# Patient Record
Sex: Female | Born: 1955
Health system: Southern US, Community
[De-identification: ages and names within clinical notes are randomized; demographics above are authoritative.]

## PROBLEM LIST (undated history)

## (undated) DIAGNOSIS — F32A Depression, unspecified: Secondary | ICD-10-CM

## (undated) DIAGNOSIS — L0232 Furuncle of buttock: Secondary | ICD-10-CM

## (undated) DIAGNOSIS — I10 Essential (primary) hypertension: Secondary | ICD-10-CM

## (undated) DIAGNOSIS — F329 Major depressive disorder, single episode, unspecified: Secondary | ICD-10-CM

## (undated) DIAGNOSIS — E119 Type 2 diabetes mellitus without complications: Secondary | ICD-10-CM

## (undated) DIAGNOSIS — E1140 Type 2 diabetes mellitus with diabetic neuropathy, unspecified: Secondary | ICD-10-CM

## (undated) DIAGNOSIS — E785 Hyperlipidemia, unspecified: Secondary | ICD-10-CM

## (undated) DIAGNOSIS — E1110 Type 2 diabetes mellitus with ketoacidosis without coma: Secondary | ICD-10-CM

## (undated) DIAGNOSIS — G629 Polyneuropathy, unspecified: Secondary | ICD-10-CM

## (undated) DIAGNOSIS — L97509 Non-pressure chronic ulcer of other part of unspecified foot with unspecified severity: Secondary | ICD-10-CM

## (undated) DIAGNOSIS — D649 Anemia, unspecified: Secondary | ICD-10-CM

## (undated) DIAGNOSIS — I739 Peripheral vascular disease, unspecified: Secondary | ICD-10-CM

## (undated) HISTORY — DX: Peripheral vascular disease, unspecified: I73.9

## (undated) HISTORY — PX: COLONOSCOPY: SHX174

## (undated) HISTORY — DX: Hyperlipidemia, unspecified: E78.5

---

## 1998-12-23 ENCOUNTER — Emergency Department (HOSPITAL_COMMUNITY): Admission: EM | Admit: 1998-12-23 | Discharge: 1998-12-23 | Payer: Self-pay | Admitting: Emergency Medicine

## 2000-04-24 ENCOUNTER — Ambulatory Visit (HOSPITAL_COMMUNITY): Admission: RE | Admit: 2000-04-24 | Discharge: 2000-04-24 | Payer: Self-pay | Admitting: Orthopedic Surgery

## 2000-04-24 ENCOUNTER — Encounter: Payer: Self-pay | Admitting: Orthopedic Surgery

## 2000-04-25 ENCOUNTER — Emergency Department (HOSPITAL_COMMUNITY): Admission: EM | Admit: 2000-04-25 | Discharge: 2000-04-26 | Payer: Self-pay | Admitting: Emergency Medicine

## 2000-04-25 ENCOUNTER — Encounter: Payer: Self-pay | Admitting: Emergency Medicine

## 2001-01-23 ENCOUNTER — Encounter: Admission: RE | Admit: 2001-01-23 | Discharge: 2001-04-23 | Payer: Self-pay | Admitting: Family Medicine

## 2001-06-05 ENCOUNTER — Encounter: Admission: RE | Admit: 2001-06-05 | Discharge: 2001-09-03 | Payer: Self-pay | Admitting: Family Medicine

## 2002-08-15 ENCOUNTER — Encounter: Admission: RE | Admit: 2002-08-15 | Discharge: 2002-08-15 | Payer: Self-pay | Admitting: Family Medicine

## 2002-08-15 ENCOUNTER — Encounter: Payer: Self-pay | Admitting: Family Medicine

## 2003-01-22 ENCOUNTER — Emergency Department (HOSPITAL_COMMUNITY): Admission: EM | Admit: 2003-01-22 | Discharge: 2003-01-22 | Payer: Self-pay | Admitting: Emergency Medicine

## 2003-01-22 ENCOUNTER — Encounter: Payer: Self-pay | Admitting: Emergency Medicine

## 2003-01-24 ENCOUNTER — Inpatient Hospital Stay (HOSPITAL_COMMUNITY): Admission: EM | Admit: 2003-01-24 | Discharge: 2003-01-27 | Payer: Self-pay | Admitting: Emergency Medicine

## 2003-01-24 ENCOUNTER — Encounter (INDEPENDENT_AMBULATORY_CARE_PROVIDER_SITE_OTHER): Payer: Self-pay | Admitting: *Deleted

## 2003-01-24 ENCOUNTER — Encounter: Payer: Self-pay | Admitting: General Surgery

## 2003-01-24 HISTORY — PX: CHOLECYSTECTOMY: SHX55

## 2003-09-05 ENCOUNTER — Emergency Department (HOSPITAL_COMMUNITY): Admission: EM | Admit: 2003-09-05 | Discharge: 2003-09-05 | Payer: Self-pay | Admitting: Emergency Medicine

## 2003-09-16 DIAGNOSIS — I10 Essential (primary) hypertension: Secondary | ICD-10-CM

## 2003-09-16 DIAGNOSIS — E1165 Type 2 diabetes mellitus with hyperglycemia: Secondary | ICD-10-CM

## 2003-09-16 DIAGNOSIS — E118 Type 2 diabetes mellitus with unspecified complications: Secondary | ICD-10-CM

## 2003-12-25 ENCOUNTER — Ambulatory Visit (HOSPITAL_COMMUNITY): Admission: RE | Admit: 2003-12-25 | Discharge: 2003-12-25 | Payer: Self-pay | Admitting: Family Medicine

## 2004-01-07 ENCOUNTER — Encounter: Admission: RE | Admit: 2004-01-07 | Discharge: 2004-01-07 | Payer: Self-pay | Admitting: Nurse Practitioner

## 2005-05-15 ENCOUNTER — Ambulatory Visit (HOSPITAL_COMMUNITY): Admission: RE | Admit: 2005-05-15 | Discharge: 2005-05-15 | Payer: Self-pay | Admitting: Family Medicine

## 2006-04-29 ENCOUNTER — Emergency Department (HOSPITAL_COMMUNITY): Admission: EM | Admit: 2006-04-29 | Discharge: 2006-04-29 | Payer: Self-pay | Admitting: Family Medicine

## 2006-05-21 ENCOUNTER — Emergency Department (HOSPITAL_COMMUNITY): Admission: EM | Admit: 2006-05-21 | Discharge: 2006-05-21 | Payer: Self-pay | Admitting: Emergency Medicine

## 2006-08-27 ENCOUNTER — Ambulatory Visit: Payer: Self-pay | Admitting: Nurse Practitioner

## 2006-08-28 ENCOUNTER — Ambulatory Visit: Payer: Self-pay | Admitting: *Deleted

## 2006-09-06 ENCOUNTER — Ambulatory Visit: Payer: Self-pay | Admitting: Nurse Practitioner

## 2006-09-18 ENCOUNTER — Ambulatory Visit: Payer: Self-pay | Admitting: Nurse Practitioner

## 2006-11-02 ENCOUNTER — Ambulatory Visit: Payer: Self-pay | Admitting: Nurse Practitioner

## 2006-11-16 ENCOUNTER — Ambulatory Visit: Payer: Self-pay | Admitting: Nurse Practitioner

## 2006-11-27 ENCOUNTER — Ambulatory Visit (HOSPITAL_COMMUNITY): Admission: RE | Admit: 2006-11-27 | Discharge: 2006-11-27 | Payer: Self-pay | Admitting: Nurse Practitioner

## 2006-11-27 ENCOUNTER — Ambulatory Visit: Payer: Self-pay | Admitting: Nurse Practitioner

## 2006-11-30 ENCOUNTER — Ambulatory Visit: Payer: Self-pay | Admitting: Nurse Practitioner

## 2007-01-15 ENCOUNTER — Emergency Department (HOSPITAL_COMMUNITY): Admission: EM | Admit: 2007-01-15 | Discharge: 2007-01-16 | Payer: Self-pay | Admitting: Emergency Medicine

## 2007-08-07 ENCOUNTER — Encounter (INDEPENDENT_AMBULATORY_CARE_PROVIDER_SITE_OTHER): Payer: Self-pay | Admitting: *Deleted

## 2007-09-13 ENCOUNTER — Ambulatory Visit: Payer: Self-pay | Admitting: Internal Medicine

## 2007-09-27 ENCOUNTER — Encounter (INDEPENDENT_AMBULATORY_CARE_PROVIDER_SITE_OTHER): Payer: Self-pay | Admitting: Family Medicine

## 2007-09-27 ENCOUNTER — Ambulatory Visit: Payer: Self-pay | Admitting: Internal Medicine

## 2007-09-27 LAB — CONVERTED CEMR LAB
Alkaline Phosphatase: 101 units/L (ref 39–117)
BUN: 12 mg/dL (ref 6–23)
CO2: 24 meq/L (ref 19–32)
Cholesterol: 176 mg/dL (ref 0–200)
Creatinine, Ser: 0.83 mg/dL (ref 0.40–1.20)
Eosinophils Absolute: 0.3 10*3/uL (ref 0.0–0.7)
Eosinophils Relative: 5 % (ref 0–5)
Glucose, Bld: 227 mg/dL — ABNORMAL HIGH (ref 70–99)
HCT: 37.9 % (ref 36.0–46.0)
HDL: 51 mg/dL (ref >39)
Hemoglobin: 13.9 g/dL (ref 12.0–15.0)
LDL Cholesterol: 104 mg/dL — ABNORMAL HIGH (ref 0–99)
Lymphocytes Relative: 43 % (ref 12–46)
Lymphs Abs: 2.2 10*3/uL (ref 0.7–3.3)
MCV: 86.3 fL (ref 78.0–100.0)
Monocytes Absolute: 0.3 10*3/uL (ref 0.2–0.7)
Platelets: 218 10*3/uL (ref 150–400)
Sodium: 137 meq/L (ref 135–145)
Total Bilirubin: 0.6 mg/dL (ref 0.3–1.2)
Total CHOL/HDL Ratio: 3.5
Triglycerides: 106 mg/dL (ref <150)
VLDL: 21 mg/dL (ref 0–40)
WBC: 5.2 10*3/uL (ref 4.0–10.5)

## 2007-11-01 ENCOUNTER — Ambulatory Visit: Payer: Self-pay | Admitting: Family Medicine

## 2007-11-04 ENCOUNTER — Ambulatory Visit (HOSPITAL_COMMUNITY): Admission: RE | Admit: 2007-11-04 | Discharge: 2007-11-04 | Payer: Self-pay | Admitting: Family Medicine

## 2007-11-06 ENCOUNTER — Ambulatory Visit (HOSPITAL_COMMUNITY): Admission: RE | Admit: 2007-11-06 | Discharge: 2007-11-06 | Payer: Self-pay | Admitting: Family Medicine

## 2007-11-20 ENCOUNTER — Encounter: Admission: RE | Admit: 2007-11-20 | Discharge: 2007-11-20 | Payer: Self-pay | Admitting: Family Medicine

## 2008-01-03 ENCOUNTER — Encounter (INDEPENDENT_AMBULATORY_CARE_PROVIDER_SITE_OTHER): Payer: Self-pay | Admitting: Nurse Practitioner

## 2008-01-03 ENCOUNTER — Encounter: Payer: Self-pay | Admitting: Family Medicine

## 2008-01-03 ENCOUNTER — Ambulatory Visit: Payer: Self-pay | Admitting: Internal Medicine

## 2008-01-03 LAB — CONVERTED CEMR LAB
AST: 12 units/L (ref 0–37)
Albumin: 4 g/dL (ref 3.5–5.2)
Alkaline Phosphatase: 95 units/L (ref 39–117)
BUN: 10 mg/dL (ref 6–23)
Creatinine, Ser: 0.81 mg/dL (ref 0.40–1.20)
Glucose, Bld: 200 mg/dL — ABNORMAL HIGH (ref 70–99)
HDL: 52 mg/dL (ref >39)
LDL Cholesterol: 64 mg/dL (ref 0–99)
Pap Smear: NEGATIVE
Potassium: 4.1 meq/L (ref 3.5–5.3)
Total Bilirubin: 0.4 mg/dL (ref 0.3–1.2)
Total CHOL/HDL Ratio: 3.1
Triglycerides: 237 mg/dL — ABNORMAL HIGH (ref <150)

## 2008-06-18 ENCOUNTER — Ambulatory Visit: Payer: Self-pay | Admitting: Nurse Practitioner

## 2008-06-18 LAB — CONVERTED CEMR LAB
Albumin: 4 g/dL
Alkaline Phosphatase: 95 units/L
BUN: 10 mg/dL
Bilirubin Urine: NEGATIVE
CO2: 25 meq/L
Calcium: 9.7 mg/dL
Glucose, Bld: 200 mg/dL
KOH Prep: NEGATIVE
Ketones, urine, test strip: NEGATIVE
Sodium: 140 meq/L
Specific Gravity, Urine: 1.01
Urobilinogen, UA: 0.2

## 2008-06-19 ENCOUNTER — Ambulatory Visit: Payer: Self-pay | Admitting: Nurse Practitioner

## 2008-07-14 ENCOUNTER — Ambulatory Visit: Payer: Self-pay | Admitting: Nurse Practitioner

## 2008-07-14 DIAGNOSIS — B351 Tinea unguium: Secondary | ICD-10-CM | POA: Insufficient documentation

## 2008-07-14 DIAGNOSIS — K029 Dental caries, unspecified: Secondary | ICD-10-CM | POA: Insufficient documentation

## 2008-07-14 LAB — CONVERTED CEMR LAB: Blood Glucose, Fingerstick: 219

## 2008-07-15 ENCOUNTER — Encounter (INDEPENDENT_AMBULATORY_CARE_PROVIDER_SITE_OTHER): Payer: Self-pay | Admitting: Nurse Practitioner

## 2008-09-03 ENCOUNTER — Ambulatory Visit: Payer: Self-pay | Admitting: Nurse Practitioner

## 2008-09-03 DIAGNOSIS — K3184 Gastroparesis: Secondary | ICD-10-CM

## 2008-09-03 LAB — CONVERTED CEMR LAB
Hgb A1c MFr Bld: 11.1 %
Nitrite: NEGATIVE
Protein, U semiquant: NEGATIVE
Urobilinogen, UA: NEGATIVE
WBC Urine, dipstick: NEGATIVE

## 2008-09-08 ENCOUNTER — Ambulatory Visit: Payer: Self-pay | Admitting: Nurse Practitioner

## 2008-10-02 ENCOUNTER — Ambulatory Visit: Payer: Self-pay | Admitting: Nurse Practitioner

## 2008-10-28 ENCOUNTER — Ambulatory Visit: Payer: Self-pay | Admitting: Nurse Practitioner

## 2009-01-20 ENCOUNTER — Telehealth (INDEPENDENT_AMBULATORY_CARE_PROVIDER_SITE_OTHER): Payer: Self-pay | Admitting: Nurse Practitioner

## 2009-02-04 ENCOUNTER — Ambulatory Visit: Payer: Self-pay | Admitting: Internal Medicine

## 2009-02-15 ENCOUNTER — Ambulatory Visit: Payer: Self-pay | Admitting: Nurse Practitioner

## 2009-02-15 DIAGNOSIS — K219 Gastro-esophageal reflux disease without esophagitis: Secondary | ICD-10-CM

## 2009-04-08 ENCOUNTER — Ambulatory Visit: Payer: Self-pay | Admitting: Nurse Practitioner

## 2009-04-08 LAB — CONVERTED CEMR LAB
Blood Glucose, Fingerstick: 339
Hgb A1c MFr Bld: 13.4 %

## 2009-04-12 ENCOUNTER — Encounter (INDEPENDENT_AMBULATORY_CARE_PROVIDER_SITE_OTHER): Payer: Self-pay | Admitting: Nurse Practitioner

## 2009-04-12 LAB — CONVERTED CEMR LAB
Basophils Absolute: 0 10*3/uL (ref 0.0–0.1)
Cholesterol: 162 mg/dL (ref 0–200)
Hemoglobin: 14.2 g/dL (ref 12.0–15.0)
Lymphocytes Relative: 32 % (ref 12–46)
Lymphs Abs: 1.8 10*3/uL (ref 0.7–4.0)
Monocytes Absolute: 0.4 10*3/uL (ref 0.1–1.0)
Monocytes Relative: 7 % (ref 3–12)
Neutro Abs: 3.3 10*3/uL (ref 1.7–7.7)
RBC: 4.91 M/uL (ref 3.87–5.11)
RDW: 13.7 % (ref 11.5–15.5)
Triglycerides: 90 mg/dL (ref <150)
VLDL: 18 mg/dL (ref 0–40)
WBC: 5.6 10*3/uL (ref 4.0–10.5)

## 2009-05-19 ENCOUNTER — Ambulatory Visit: Payer: Self-pay | Admitting: Nurse Practitioner

## 2009-05-19 DIAGNOSIS — E7800 Pure hypercholesterolemia, unspecified: Secondary | ICD-10-CM | POA: Insufficient documentation

## 2009-05-19 DIAGNOSIS — R252 Cramp and spasm: Secondary | ICD-10-CM | POA: Insufficient documentation

## 2009-05-19 LAB — CONVERTED CEMR LAB: LDL Goal: 100 mg/dL

## 2009-06-03 ENCOUNTER — Ambulatory Visit: Payer: Self-pay | Admitting: Internal Medicine

## 2009-06-30 ENCOUNTER — Ambulatory Visit: Payer: Self-pay | Admitting: Internal Medicine

## 2009-06-30 ENCOUNTER — Encounter (INDEPENDENT_AMBULATORY_CARE_PROVIDER_SITE_OTHER): Payer: Self-pay | Admitting: Nurse Practitioner

## 2009-08-10 ENCOUNTER — Encounter (INDEPENDENT_AMBULATORY_CARE_PROVIDER_SITE_OTHER): Payer: Self-pay | Admitting: Nurse Practitioner

## 2009-08-11 ENCOUNTER — Encounter (INDEPENDENT_AMBULATORY_CARE_PROVIDER_SITE_OTHER): Payer: Self-pay | Admitting: Nurse Practitioner

## 2009-09-10 ENCOUNTER — Encounter (INDEPENDENT_AMBULATORY_CARE_PROVIDER_SITE_OTHER): Payer: Self-pay | Admitting: Nurse Practitioner

## 2009-09-10 ENCOUNTER — Other Ambulatory Visit: Admission: RE | Admit: 2009-09-10 | Discharge: 2009-09-10 | Payer: Self-pay | Admitting: Internal Medicine

## 2009-09-10 ENCOUNTER — Ambulatory Visit: Payer: Self-pay | Admitting: Nurse Practitioner

## 2009-09-10 DIAGNOSIS — F329 Major depressive disorder, single episode, unspecified: Secondary | ICD-10-CM

## 2009-09-16 ENCOUNTER — Encounter (INDEPENDENT_AMBULATORY_CARE_PROVIDER_SITE_OTHER): Payer: Self-pay | Admitting: Nurse Practitioner

## 2009-09-21 ENCOUNTER — Encounter: Admission: RE | Admit: 2009-09-21 | Discharge: 2009-09-21 | Payer: Self-pay | Admitting: Internal Medicine

## 2009-10-08 ENCOUNTER — Encounter (INDEPENDENT_AMBULATORY_CARE_PROVIDER_SITE_OTHER): Payer: Self-pay | Admitting: Nurse Practitioner

## 2009-10-21 ENCOUNTER — Ambulatory Visit: Payer: Self-pay | Admitting: Nurse Practitioner

## 2009-10-21 LAB — CONVERTED CEMR LAB
Blood Glucose, Fingerstick: 538
Glucose, Urine, Semiquant: >=1000
Ketones, urine, test strip: NEGATIVE
Nitrite: NEGATIVE
Specific Gravity, Urine: <1.005
WBC Urine, dipstick: NEGATIVE
pH: 6

## 2009-11-02 ENCOUNTER — Ambulatory Visit: Payer: Self-pay | Admitting: Nurse Practitioner

## 2009-11-30 ENCOUNTER — Ambulatory Visit: Payer: Self-pay | Admitting: Nurse Practitioner

## 2009-11-30 LAB — CONVERTED CEMR LAB: Blood Glucose, Fingerstick: 361

## 2010-01-11 ENCOUNTER — Ambulatory Visit: Payer: Self-pay | Admitting: Nurse Practitioner

## 2010-03-03 ENCOUNTER — Telehealth (INDEPENDENT_AMBULATORY_CARE_PROVIDER_SITE_OTHER): Payer: Self-pay | Admitting: Nurse Practitioner

## 2010-03-04 ENCOUNTER — Ambulatory Visit: Payer: Self-pay | Admitting: Nurse Practitioner

## 2010-03-18 ENCOUNTER — Emergency Department (HOSPITAL_COMMUNITY): Admission: EM | Admit: 2010-03-18 | Discharge: 2010-03-18 | Payer: Self-pay | Admitting: Emergency Medicine

## 2010-04-06 ENCOUNTER — Telehealth (INDEPENDENT_AMBULATORY_CARE_PROVIDER_SITE_OTHER): Payer: Self-pay | Admitting: Nurse Practitioner

## 2010-04-11 ENCOUNTER — Ambulatory Visit: Payer: Self-pay | Admitting: Nurse Practitioner

## 2010-04-11 LAB — CONVERTED CEMR LAB
Blood Glucose, Fingerstick: 334
Hgb A1c MFr Bld: 12.2 %

## 2010-05-03 ENCOUNTER — Ambulatory Visit: Payer: Self-pay | Admitting: Nurse Practitioner

## 2010-05-10 ENCOUNTER — Ambulatory Visit: Payer: Self-pay | Admitting: Nurse Practitioner

## 2010-05-11 ENCOUNTER — Emergency Department (HOSPITAL_COMMUNITY): Admission: EM | Admit: 2010-05-11 | Discharge: 2010-05-12 | Payer: Self-pay | Admitting: Emergency Medicine

## 2010-07-12 ENCOUNTER — Ambulatory Visit: Payer: Self-pay | Admitting: Nurse Practitioner

## 2010-07-12 LAB — CONVERTED CEMR LAB
Albumin: 4 g/dL (ref 3.5–5.2)
CO2: 27 meq/L (ref 19–32)
Chloride: 103 meq/L (ref 96–112)
Glucose, Bld: 131 mg/dL — ABNORMAL HIGH (ref 70–99)
Hgb A1c MFr Bld: 10.7 %
Lymphocytes Relative: 35 % (ref 12–46)
Lymphs Abs: 2 10*3/uL (ref 0.7–4.0)
Neutrophils Relative %: 56 % (ref 43–77)
Potassium: 3.8 meq/L (ref 3.5–5.3)
Sodium: 139 meq/L (ref 135–145)
TSH: 1.655 microintl units/mL (ref 0.350–4.500)
Total Protein: 7.3 g/dL (ref 6.0–8.3)
WBC: 5.8 10*3/uL (ref 4.0–10.5)

## 2010-07-13 ENCOUNTER — Encounter (INDEPENDENT_AMBULATORY_CARE_PROVIDER_SITE_OTHER): Payer: Self-pay | Admitting: Nurse Practitioner

## 2010-10-10 ENCOUNTER — Telehealth (INDEPENDENT_AMBULATORY_CARE_PROVIDER_SITE_OTHER): Payer: Self-pay | Admitting: Nurse Practitioner

## 2010-10-21 ENCOUNTER — Encounter: Admission: RE | Admit: 2010-10-21 | Discharge: 2010-10-21 | Payer: Self-pay | Admitting: Internal Medicine

## 2010-11-02 ENCOUNTER — Encounter (INDEPENDENT_AMBULATORY_CARE_PROVIDER_SITE_OTHER): Payer: Self-pay | Admitting: Nurse Practitioner

## 2010-11-04 ENCOUNTER — Encounter
Admission: RE | Admit: 2010-11-04 | Discharge: 2010-11-04 | Payer: Self-pay | Source: Home / Self Care | Attending: Internal Medicine | Admitting: Internal Medicine

## 2010-11-12 ENCOUNTER — Emergency Department (HOSPITAL_COMMUNITY)
Admission: EM | Admit: 2010-11-12 | Discharge: 2010-11-12 | Payer: Self-pay | Source: Home / Self Care | Admitting: Emergency Medicine

## 2010-11-30 ENCOUNTER — Ambulatory Visit: Admit: 2010-11-30 | Payer: Self-pay | Admitting: Nurse Practitioner

## 2010-12-18 LAB — CONVERTED CEMR LAB
ALT: 10 units/L (ref 0–35)
AST: 10 units/L (ref 0–37)
Basophils Relative: 0 % (ref 0–1)
Cholesterol: 165 mg/dL (ref 0–200)
Creatinine, Ser: 0.78 mg/dL (ref 0.40–1.20)
Eosinophils Absolute: 0.1 10*3/uL (ref 0.0–0.7)
Glucose, Urine, Semiquant: >=1000
HDL: 48 mg/dL (ref >39)
Hgb A1c MFr Bld: >14 %
MCHC: 32.5 g/dL (ref 30.0–36.0)
MCV: 87.3 fL (ref 78.0–100.0)
Microalb, Ur: 4.82 mg/dL — ABNORMAL HIGH (ref 0.00–1.89)
Neutrophils Relative %: 60 % (ref 43–77)
RDW: 13.5 % (ref 11.5–15.5)
Specific Gravity, Urine: 1.025
Total Bilirubin: 0.5 mg/dL (ref 0.3–1.2)
Total CHOL/HDL Ratio: 3.4
VLDL: 21 mg/dL (ref 0–40)
WBC Urine, dipstick: NEGATIVE
WBC: 5.1 10*3/uL (ref 4.0–10.5)
pH: 5.5

## 2010-12-20 NOTE — Progress Notes (Signed)
Summary: NEED APP FOR [facility]  Phone Note Call from Patient Call back at [phone]   Summary of Call: NEED TO SCHEDULE APP FOR BREAST CENTER A SOON AS  POSIBLE PHONE [phone] Initial call taken by: Domenic Polite,  October 10, 2010 10:33 AM  Follow-up for Phone Call        Left message on answer machine for pt. to return call. Gaylyn Cheers RN  October 10, 2010 10:52 AM      Additional Follow-up for Phone Call Additional follow up Details #1::        Pt. returned call mammo scheduled pt. aware. Additional Follow-up by: Gaylyn Cheers RN,  October 10, 2010 11:33 AM  New Problems: UNSPECIFIED BREAST SCREENING (ICD-V76.10)   New Problems: UNSPECIFIED BREAST SCREENING (ICD-V76.10)

## 2010-12-20 NOTE — Letter (Signed)
Summary: *HSN Results Follow up  [facility]  [address] [place], [place] [address]   Phone: [phone]  Fax: [phone]      07/13/2010   Katherine [name] [address] [place], [place]  [address]   Dear  [name],                            ____S.Drinkard,FNP   ____D. Gore,FNP       ____B. McPherson,MD   ____V. Rankins,MD    ____E. Mulberry,MD    _X___N. Daphine Deutscher, FNP  ____D. Reche Dixon, MD    ____K. Philipp Deputy, MD    ____Other     This letter is to inform you that your recent test(s):  _______Pap Smear    ___X____Lab Test     _______X-ray    ___X____ is within acceptable limits  _______ requires a medication change  _______ requires a follow-up lab visit  _______ requires a follow-up visit with your provider   Comments: Labs done during recent office visit show your blood sugar is still high but otherwise your liver, kidneys and thyroid labs are ok.  Remember to come fasting during your next visit so your cholesterol can be checked.       _________________________________________________________ If you have any questions, please contact our office [phone].                    Sincerely,    Lehman Prom FNP [facility]

## 2010-12-20 NOTE — Letter (Signed)
Summary: DENTAL REFERRAL  DENTAL REFERRAL   Imported By: Arta Bruce 04/11/2010 16:20:40  _____________________________________________________________________  External Attachment:    Type:   Image     Comment:   External Document

## 2010-12-20 NOTE — Letter (Signed)
Summary: Handout Printed  Printed Handout:  - Gastroparesis and Diabetes 

## 2010-12-20 NOTE — Assessment & Plan Note (Signed)
Summary: FU BP CHECK TAKE MED BEFORE THIS APPT//GK  pt. left without being seen. Gaylyn Cheers RN  May 10, 2010 1:12 PM  Nurse Visit   Allergies: 1)  ! * Seaweed

## 2010-12-20 NOTE — Assessment & Plan Note (Signed)
Summary: Diabetes/HTN   Vital Signs:  Patient profile:   55 year old female Menstrual status:  perimenopausal Height:      70.50 inches Weight:      221.6 pounds Temp:     98.3 degrees F oral Pulse rate:   71 / minute Pulse rhythm:   regular Resp:     20 per minute BP sitting:   183 / 67  (left arm) Cuff size:   regular  Vitals Entered By: Levon Hedger (July 12, 2010 9:28 AM) CC: follow-up visit in 3 months DM, Lipid Management, Hypertension Management Is Patient Diabetic? Yes Pain Assessment Patient in pain? no      CBG Result 230 CBG Device ID A  Does patient need assistance? Functional Status Self care Ambulation Normal   CC:  follow-up visit in 3 months DM, Lipid Management, and Hypertension Management.  History of Present Illness:  Pt into the office for f/u on diabetes.    Diabetes Management History:      The patient is a 55 years old female who comes in for evaluation of DM Type 2.  She has not been enrolled in the "Diabetic Education Program".  She states lack of understanding of dietary principles and is not following her diet appropriately.  No sensory loss is reported.  Self foot exams are not being performed.  She is not checking home blood sugars.  She says that she is not exercising regularly.        Other comments include: Pt adjusts her own insulin at home. She has been told several times by this provider NOT to adjust her insulin however she seems convinced that she an adjust her own insulin.  Pt has not been checking blood sugars at home.        The following changes have been made to her treatment plan since last visit: insulin dosing.  Treatment plan changes were initiated by patient.    Hypertension History:      She denies headache, chest pain, and palpitations.  Pt reports that she has taken her tiazac and lisinopril already today.  She was given bystolic during last visit but she did not come back as ordered for BP check so she completed her  supply of bystololic as she was only a 28 supply.        Positive major cardiovascular risk factors include diabetes, hyperlipidemia, and hypertension.  Negative major cardiovascular risk factors include female age less than 70 years old, negative family history for ischemic heart disease, and non-tobacco-user status.        Further assessment for target organ damage reveals no history of ASHD, cardiac end-organ damage (CHF/LVH), stroke/TIA, peripheral vascular disease, renal insufficiency, or hypertensive retinopathy.    Lipid Management History:      Positive NCEP/ATP III risk factors include diabetes and hypertension.  Negative NCEP/ATP III risk factors include female age less than 40 years old, no family history for ischemic heart disease, non-tobacco-user status, no ASHD (atherosclerotic heart disease), no prior stroke/TIA, no peripheral vascular disease, and no history of aortic aneurysm.        The patient states that she knows about the "Therapeutic Lifestyle Change" diet.  Her compliance with the TLC diet is poor.  Comments include: PT IS NOT FASTING TODAY FOR LABS.      Habits & Providers  Alcohol-Tobacco-Diet     Alcohol drinks/day: 0     Tobacco Status: never  Exercise-Depression-Behavior     Does Patient Exercise: no  Depression Counseling: further diagnostic testing and/or other treatment is indicated     Drug Use: no     Seat Belt Use: 100     Sun Exposure: occasionally  Allergies (verified): 1)  ! * Seaweed  Review of Systems General:  Complains of fatigue. CV:  Denies chest pain or discomfort. Resp:  Denies cough. GI:  Denies abdominal pain, nausea, and vomiting. Psych:  Complains of depression.  Physical Exam  General:  alert.   Head:  normocephalic.   Lungs:  normal breath sounds.   Heart:  normal rate and regular rhythm.   Abdomen:  normal bowel sounds.   Msk:  up to the exam table Extremities:  trace left pedal edema and trace right pedal edema.     Neurologic:  alert & oriented X3.    Diabetes Management Exam:    Foot Exam (with socks and/or shoes not present):       Sensory-Monofilament:          Left foot: normal          Right foot: normal   Impression & Recommendations:  Problem # 1:  DIABETES MELLITUS, TYPE II, UNCONTROLLED (ICD-250.02) Hgba1c = 10.7 today which is improved for pt. Pt is still adjusting her insulin despite requests not to do so Her updated medication list for this problem includes:    Adult Aspirin Ec Low Strength 81 Mg Tbec (Aspirin) .Marland Kitchen... Take one tablet every day    Lisinopril 40 Mg Tabs (Lisinopril) .Marland Kitchen... 1 tablet by mouth daily for blood pressure and kidneys    Lantus 100 Unit/ml Soln (Insulin glargine) .Marland KitchenMarland KitchenMarland KitchenMarland Kitchen 70 units subcutaneously nightly for diabetes    Novolog Flexpen 100 Unit/ml Soln (Insulin aspart) .Marland KitchenMarland KitchenMarland KitchenMarland Kitchen 15 units subcutaneously three times a day  Orders: Hemoglobin A1C (83036)  Problem # 2:  HYPERTENSION, BENIGN ESSENTIAL (ICD-401.1) BP still elevated. will give more samples of bystolic advise bp check in 2 weeks Her updated medication list for this problem includes:    Tiazac 360 Mg Xr24h-cap (Diltiazem hcl er beads) .Marland Kitchen... Take one capsule daily for blood presure    Lisinopril 40 Mg Tabs (Lisinopril) .Marland Kitchen... 1 tablet by mouth daily for blood pressure and kidneys    Bystolic 5 Mg Tabs (Nebivolol hcl) ..... One tablet by mouth daily  Orders: T-Comprehensive Metabolic Panel [id number]) T-CBC w/Diff ([id number]) T-TSH ([id number])  Problem # 3:  HYPERCHOLESTEROLEMIA (ICD-272.0) pt is not fasting today.  unable to check labs Her updated medication list for this problem includes:    Pravastatin Sodium 20 Mg Tabs (Pravastatin sodium) .Marland Kitchen... 1 tablet by mouth nighty for cholesterol  Problem # 4:  DEPRESSION (ICD-311) spoke with pt about depression currently dealing with husband's illness  Complete Medication List: 1)  Adult Aspirin Ec Low Strength 81 Mg Tbec (Aspirin) .... Take one  tablet every day 2)  Tiazac 360 Mg Xr24h-cap (Diltiazem hcl er beads) .... Take one capsule daily for blood presure 3)  Therapeutic M Tabs (Multiple vitamins-minerals) .... One tablet by mouth daily 4)  Oyster Shell Calcium/d 500-125 Mg-unit Tabs (Calcium-vitamin d) .... One table by mouth two times a day 5)  Lisinopril 40 Mg Tabs (Lisinopril) .Marland Kitchen.. 1 tablet by mouth daily for blood pressure and kidneys 6)  Truetrack Test Strp (Glucose blood) .... Check blood sugar two times a day 7)  Lantus 100 Unit/ml Soln (Insulin glargine) .... 70 units subcutaneously nightly for diabetes 8)  Unifine Pentips 31g X 6 Mm Misc (Insulin pen needle) .... Use with lantus insulin  once at night 9)  Nexium 40 Mg Cpdr (Esomeprazole magnesium) .Marland Kitchen.. 1 tablet by mouth daily for stomach 10)  Loratadine 10 Mg Tabs (Loratadine) .... One tablet by mouth daily for allergies 11)  Pravastatin Sodium 20 Mg Tabs (Pravastatin sodium) .Marland Kitchen.. 1 tablet by mouth nighty for cholesterol 12)  Flexeril 5 Mg Tabs (Cyclobenzaprine hcl) .... One tablet by mouth nightly as needed for cramps 13)  Novolog Flexpen 100 Unit/ml Soln (Insulin aspart) .Marland Kitchen.. 15 units subcutaneously three times a day 14)  Ultram 50 Mg Tabs (Tramadol hcl) .... One tablet by mouth two times a day as needed for pain 15)  Bd Insulin Syringe Ultrafine 29g X 1/2" 1 Ml Misc (Insulin syringe-needle u-100) .... Use with lantus insulin nightly 16)  Lancets Misc (Lancets) .... Use to check blood sugar three times a day 17)  Reglan 10 Mg Tabs (Metoclopramide hcl) .... One tablet by mouth 30 minutes before meals 18)  Blood Glucose Meter Kit (Blood glucose monitoring suppl) .... Dispense glucometer and testing supplies for four times daily testing 19)  Bystolic 5 Mg Tabs (Nebivolol hcl) .... One tablet by mouth daily  Other Orders: Capillary Blood Glucose/CBG (40347)  Diabetes Management Assessment/Plan:      The following lipid goals have been established for the patient: Total  cholesterol goal of 200; LDL cholesterol goal of 100; HDL cholesterol goal of 40; Triglyceride goal of 150.  Her blood pressure goal is < 130/80.    Hypertension Assessment/Plan:      The patient's hypertensive risk group is category C: Target organ damage and/or diabetes.  Her calculated 10 year risk of coronary heart disease is 17 %.  Today's blood pressure is 183/67.  Her blood pressure goal is < 130/80.  Lipid Assessment/Plan:      Based on NCEP/ATP III, the patient's risk factor category is "history of diabetes".  The patient's lipid goals are as follows: Total cholesterol goal is 200; LDL cholesterol goal is 100; HDL cholesterol goal is 40; Triglyceride goal is 150.  Her LDL cholesterol goal has not been met.    Patient Instructions: 1)  Blood pressure - still elevated today. 2)  Add the Bystolic 5mg  by mouth daily 3)  Continue to take Tiazac and lisinopril daily. 4)  Return to office in 2 weeks for blood pressure 5)  TAKE ALL THE MEDICATIONS BEFORE THIS VISIT but NO FOOD 6)  Will need cholesterol (lipids)  7)  Concerns about depression.  If symptoms worsen inform this office. 8)  Schedule a complete physical exam in November. 9)  Will need flu vaccine, cbg, hgba1c, u/a, microalbumin, EKG, mammogram   Last LDL:                                                 96 (09/10/2009 9:18:00 PM)        Diabetic Foot Exam Last Podiatry Exam Date: 07/21/2009    10-g (5.07) Semmes-Weinstein Monofilament Test Performed by: Levon Hedger          Right Foot          Left Foot Visual Inspection               Test Control      normal         normal Site 1         normal  normal Site 2         normal         normal Site 3         normal         abnormal Site 4         normal         normal Site 5         normal         normal Site 6         normal         abnormal Site 7         normal         normal Site 8         normal         normal Site 9         normal         normal Site  10         normal         abnormal  Impression      normal         normal  Laboratory Results   Blood Tests   Date/Time Received: July 12, 2010 9:45 AM   HGBA1C: 10.7%   (Normal Range: Non-Diabetic - 3-6%   Control Diabetic - 6-8%) CBG Random:: 230      Appended Document: Orders Update    Clinical Lists Changes  Observations: Added new observation of PH URINE: 5.0  (07/12/2010 13:52) Added new observation of SPEC GR URIN: >=1.030  (07/12/2010 13:52) Added new observation of UA COLOR: lt. yellow  (07/12/2010 13:52) Added new observation of WBC DIPSTK U: trace  (07/12/2010 13:52) Added new observation of NITRITE URN: negative  (07/12/2010 13:52) Added new observation of UROBILINOGEN: 0.2  (07/12/2010 13:52) Added new observation of PROTEIN, URN: trace  (07/12/2010 13:52) Added new observation of BLOOD UR DIP: small  (07/12/2010 13:52) Added new observation of KETONES URN: negative  (07/12/2010 13:52) Added new observation of BILIRUBIN UR: negative  (07/12/2010 13:52) Added new observation of GLUCOSE, URN: >=1000  (07/12/2010 13:52)      Laboratory Results   Urine Tests  Date/Time Received: July 12, 2010 1:53 PM  Date/Time Reported: July 12, 2010 1:53 PM   Routine Urinalysis   Color: lt. yellow Glucose: >=1000   (Normal Range: Negative) Bilirubin: negative   (Normal Range: Negative) Ketone: negative   (Normal Range: Negative) Spec. Gravity: >=1.030   (Normal Range: 1.003-1.035) Blood: small   (Normal Range: Negative) pH: 5.0   (Normal Range: 5.0-8.0) Protein: trace   (Normal Range: Negative) Urobilinogen: 0.2   (Normal Range: 0-1) Nitrite: negative   (Normal Range: Negative) Leukocyte Esterace: trace   (Normal Range: Negative)

## 2010-12-20 NOTE — Progress Notes (Signed)
Summary: NEED TO CALL [facility].  Medications Added LANTUS 100 UNIT/ML SOLN (INSULIN GLARGINE) 70 units subcutaneously nightly for diabetes       Phone Note Call from Patient Call back at Home Phone [phone]   Reason for Call: Refill Medication Summary of Call: Katherine [name] CALLED [facility] FOR HER REFILLS AND SHE WAS TOLD BY SOMEONE THERE, THAT THEY DON'T HAVE HALF OF HER SCRIPTS ON FILE, AND SHE WOULD NEED TO CALL us SO YOU CAN CALL THEM IN.  THEY ARE REGLAN 10MG , LANCETS, BD INSULIN SYRINGES NEEDLES U 100, ULTRAM 50MG  , TEST STRIPS, ADULT ASPRIN, OYSTER SHELL CALCIUM / D 500. Katherine [name] SAYS THESE ARE THE SCRIPTS THEY SAY THEY NEVER GOT A CALL FROM YOU. Initial call taken by: Leodis Rains,  Apr 06, 2010 11:57 AM  Follow-up for Phone Call        forward to N. Daphine Deutscher, FNP Follow-up by: Levon Hedger,  Apr 06, 2010 12:19 PM  Additional Follow-up for Phone Call Additional follow up Details #1::        Call pharmacy - see which of the medications that the pt is requesting needs refills. I think the issue is that because pt does not refill consecutively that pharmacy does not fill without provider approval. Let me know which ones they do need refills on Additional Follow-up by: Lehman Prom FNP,  Apr 06, 2010 12:41 PM    Additional Follow-up for Phone Call Additional follow up Details #2::    Pharmacy called all meds have refills except flexiril which was last filled 08/09/09. left message on answer machine for pt. to return call Gaylyn Cheers RN  Apr 07, 2010 3:12 PM Discussed with Jesse Fall pt no longer needs Flexiril. Gaylyn Cheers RN  Apr 07, 2010 4:15 PM   noted. notify pt with your findings n.[email or web address],fnp  Apr 07, 2010  5:30 PM   Follow-up by: Gaylyn Cheers RN,  Apr 07, 2010 3:13 PM  Additional Follow-up for Phone Call Additional follow up Details #3:: Details for Additional Follow-up Action Taken: Left message on answering machine to return call.   Dutch Quint RN  Apr 08, 2010 3:46 PM  medications reviewed with pt n.martin,fnp  Apr 11, 2010 10:37 AM    New/Updated Medications: LANTUS 100 UNIT/ML SOLN (INSULIN GLARGINE) 70 units subcutaneously nightly for diabetes Prescriptions: NOVOLOG FLEXPEN 100 UNIT/ML SOLN (INSULIN ASPART) 15 units subcutaneously three times a day  #1 month qs x 5   Entered and Authorized by:   Lehman Prom FNP   Signed by:   Lehman Prom FNP on 04/11/2010   Method used:   Faxed to ...       [facility] - Pharmac (retail)       [address] [place], [place]  [address]       Ph: [phone]       Fax: [phone]   RxID:   [id number] PRAVASTATIN SODIUM 20 MG TABS (PRAVASTATIN SODIUM) 1 tablet by mouth nighty for cholesterol  #30 x 5   Entered and Authorized by:   Lehman Prom FNP   Signed by:   Lehman Prom FNP on 04/11/2010   Method used:   Faxed to ...       [facility] - Pharmac (retail)       [address] [place], [place]  [address]       Ph: [phone]  Fax: [phone]   RxID:   [id number] LISINOPRIL 40 MG  TABS (LISINOPRIL) 1 tablet by mouth daily for blood pressure and kidneys  #30 x 5   Entered and Authorized by:   Lehman Prom FNP   Signed by:   Lehman Prom FNP on 04/11/2010   Method used:   Faxed to ...       [facility] - Pharmac (retail)       [address] [place], [place]  [address]       Ph: [phone]       Fax: [phone]   RxID:   [id number] TIAZAC 360 MG  XR24H-CAP (DILTIAZEM HCL ER BEADS) take one capsule daily for blood presure  #30 x 5   Entered and Authorized by:   Lehman Prom FNP   Signed by:   Lehman Prom FNP on 04/11/2010   Method used:   Faxed to ...       [facility] - Pharmac (retail)       [address] [place], [place]  [address]       Ph: [phone]       Fax:  [phone]   RxID:   [id number] LANTUS 100 UNIT/ML SOLN (INSULIN GLARGINE) 70 units subcutaneously nightly for diabetes  #1 month qs x 5   Entered and Authorized by:   Lehman Prom FNP   Signed by:   Lehman Prom FNP on 04/11/2010   Method used:   Faxed to ...       [facility] - Pharmac (retail)       [address] [place], [place]  [address]       Ph: [phone]       Fax: [phone]   RxID:   [id number] BD INSULIN SYRINGE ULTRAFINE 29G X 1/2" 1 ML MISC (INSULIN SYRINGE-NEEDLE U-100) Use with lantus insulin nightly  #100 x 5   Entered and Authorized by:   Lehman Prom FNP   Signed by:   Lehman Prom FNP on 04/11/2010   Method used:   Faxed to ...       [facility] - Pharmac (retail)       [address] [place], [place]  [address]       Ph: [phone]       Fax: [phone]   RxID:   [id number] ULTRAM 50 MG TABS (TRAMADOL HCL) One tablet by mouth two times a day as needed for pain  #50 x 0   Entered and Authorized by:   Lehman Prom FNP   Signed by:   Lehman Prom FNP on 04/11/2010   Method used:   Faxed to ...       [facility] - Pharmac (retail)       [address] [place], [place]  [address]       Ph: [phone]       Fax: [phone]   RxID:   [id number] ADULT ASPIRIN EC LOW STRENGTH 81 MG  TBEC (ASPIRIN) take one tablet every day  #30 x 5   Entered and Authorized by:   Lehman Prom FNP   Signed by:   Lehman Prom FNP on 04/11/2010   Method used:   Faxed to ...       [facility] - Pharmac (retail)       [address] [place], [place]  [address]       Ph: [phone]       Fax: [phone]   RxID:   [id number]

## 2010-12-20 NOTE — Assessment & Plan Note (Signed)
Summary: Right hand pain   Vital Signs:  Patient profile:   55 year old female Menstrual status:  perimenopausal Weight:      207.7 pounds Temp:     98.2 degrees F oral Pulse rate:   75 / minute Pulse rhythm:   regular BP sitting:   201 / 99  (left arm) Cuff size:   regular  Vitals Entered By: Mikey College CMA (March 04, 2010 11:01 AM)  Serial Vital Signs/Assessments:  Time      Position  BP       Pulse  Resp  Temp     By 11:51 AM            162/94                         Lehman Prom FNP  CC: rt hand swollen, Lipid Management, Hypertension Management Is Patient Diabetic? No CBG Result 257  Does patient need assistance? Functional Status Self care Ambulation Normal   CC:  rt hand swollen, Lipid Management, and Hypertension Management.  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  Pt report that she was required to go into the empty rooms and clean beds, sinks, remove linen.   Pt is left hand dominant However her right hand has been swollen and painful.  She had to swing big bags of laundry.  Hand only started swelling after she started working. Pt had some pain medications at the home and she took them on last night when she noticed the swelling as she was not able to get and appointment  Hypertension History:      BP is elevated today but she has NOT taken her BP meds today.        Positive major cardiovascular risk factors include diabetes, hyperlipidemia, and hypertension.  Negative major cardiovascular risk factors include female age less than 75 years old, negative family history for ischemic heart disease, and non-tobacco-user status.        Further assessment for target organ damage reveals no history of ASHD, cardiac end-organ damage (CHF/LVH), stroke/TIA, peripheral vascular disease, renal insufficiency, or  hypertensive retinopathy.    Lipid Management History:      Positive NCEP/ATP III risk factors include diabetes and hypertension.  Negative NCEP/ATP III risk factors include female age less than 48 years old, no family history for ischemic heart disease, non-tobacco-user status, no ASHD (atherosclerotic heart disease), no prior stroke/TIA, no peripheral vascular disease, and no history of aortic aneurysm.        The patient states that she knows about the "Therapeutic Lifestyle Change" diet.  Her compliance with the TLC diet is poor.      Allergies: 1)  ! * Seaweed  Review of Systems CV:  Denies chest pain or discomfort. Resp:  Denies cough. GI:  Denies abdominal pain, nausea, and vomiting. MS:  right hand swelling and pain. Neuro:  Complains of headaches.  Physical Exam  General:  alert.   Head:  normocephalic.    Diabetes Management Exam:       Nails:          Left foot: thickened          Right foot: thickened   Wrist/Hand Exam  Hand Exam:    Right:    Inspection:  Normal  Palpation:  Normal    Tenderness:  no    Swelling:  general     Erythema:  no    general tenderness   Impression & Recommendations:  Problem # 1:  HAND PAIN, RIGHT (ICD-729.5) advise pt that swelling is likely due to overuse at work she will need to support - ace wrap will refill tramadol for as needed use  Problem # 2:  HYPERTENSION, BENIGN ESSENTIAL (ICD-401.1) BP elevated today. pt has not taken her bp meds yet today and is not  compliant with the medications Her updated medication list for this problem includes:    Tiazac 360 Mg Xr24h-cap (Diltiazem hcl er beads) .Marland Kitchen... Take one capsule daily for blood presure    Lisinopril 40 Mg Tabs (Lisinopril) .Marland Kitchen... 1 tablet by mouth daily for blood pressure and kidneys  Problem # 3:  DIABETES MELLITUS, TYPE II, UNCONTROLLED (ICD-250.02) advised pt to take insulin Her updated medication list for this problem includes:    Adult Aspirin Ec Low  Strength 81 Mg Tbec (Aspirin) .Marland Kitchen... Take one tablet every day    Lisinopril 40 Mg Tabs (Lisinopril) .Marland Kitchen... 1 tablet by mouth daily for blood pressure and kidneys    Lantus 100 Unit/ml Soln (Insulin glargine) .Marland KitchenMarland KitchenMarland KitchenMarland Kitchen 70 units subcutaneously nightly **pharmacy - will change pt to vial since her dose is so high**    Novolog Flexpen 100 Unit/ml Soln (Insulin aspart) .Marland KitchenMarland KitchenMarland KitchenMarland Kitchen 15 units subcutaneously three times a day  Complete Medication List: 1)  Adult Aspirin Ec Low Strength 81 Mg Tbec (Aspirin) .... Take one tablet every day 2)  Tiazac 360 Mg Xr24h-cap (Diltiazem hcl er beads) .... Take one capsule daily for blood presure 3)  Therapeutic M Tabs (Multiple vitamins-minerals) .... One tablet by mouth daily 4)  Oyster Shell Calcium/d 500-125 Mg-unit Tabs (Calcium-vitamin d) .... One table by mouth two times a day 5)  Lisinopril 40 Mg Tabs (Lisinopril) .Marland Kitchen.. 1 tablet by mouth daily for blood pressure and kidneys 6)  Truetrack Test Strp (Glucose blood) .... Check blood sugar two times a day 7)  Lantus 100 Unit/ml Soln (Insulin glargine) .... 70 units subcutaneously nightly **pharmacy - will change pt to vial since her dose is so high** 8)  Unifine Pentips 31g X 6 Mm Misc (Insulin pen needle) .... Use with lantus insulin once at night 9)  Nexium 40 Mg Cpdr (Esomeprazole magnesium) .Marland Kitchen.. 1 tablet by mouth daily for stomach 10)  Loratadine 10 Mg Tabs (Loratadine) .... One tablet by mouth daily for allergies 11)  Pravastatin Sodium 20 Mg Tabs (Pravastatin sodium) .Marland Kitchen.. 1 tablet by mouth nighty for cholesterol 12)  Flexeril 5 Mg Tabs (Cyclobenzaprine hcl) .... One tablet by mouth nightly as needed for cramps 13)  Novolog Flexpen 100 Unit/ml Soln (Insulin aspart) .Marland Kitchen.. 15 units subcutaneously three times a day 14)  Ultram 50 Mg Tabs (Tramadol hcl) .... One tablet by mouth two times a day as needed for pain 15)  Bd Insulin Syringe Ultrafine 29g X 1/2" 1 Ml Misc (Insulin syringe-needle u-100) .... Use with lantus insulin  nightly 16)  Lancets Misc (Lancets) .... Use to check blood sugar three times a day 17)  Reglan 10 Mg Tabs (Metoclopramide hcl) .... One tablet by mouth 30 minutes before meals 18)  Blood Glucose Meter Kit (Blood glucose monitoring suppl) .... Dispense glucometer and testing supplies for four times daily testing  Hypertension Assessment/Plan:      The patient's hypertensive risk group is category C: Target organ damage and/or diabetes.  Her calculated 10 year risk of coronary heart disease is 17 %.  Today's blood pressure is 201/99.  Her blood pressure goal is < 130/80.  Lipid Assessment/Plan:      Based on NCEP/ATP III, the patient's risk factor category is "history of diabetes".  The patient's lipid goals are as follows: Total cholesterol goal is 200; LDL cholesterol goal is 100; HDL cholesterol goal is 40; Triglyceride goal is 150.  Her LDL cholesterol goal has not been met.      Patient Instructions: 1)  Right hand swelling - most likely due to overuse 2)  Keep ACE wrap on during the day - may remove at night. 3)  Keep next scheduled follow up appointment for diabetes and high blood pressure.  Please be sure to take your medications as ordered Prescriptions: ULTRAM 50 MG TABS (TRAMADOL HCL) One tablet by mouth two times a day as needed for pain  #50 x 0   Entered and Authorized by:   Lehman Prom FNP   Signed by:   Lehman Prom FNP on 03/04/2010   Method used:   Print then Give to Patient   RxID:   [id number]

## 2010-12-20 NOTE — Progress Notes (Signed)
Summary: INJURED HAND/ NEEDS WORK NOTE   Phone Note Call from Patient Call back at Home Phone [phone]   Reason for Call: Talk to Doctor Summary of Call: MARTIN PT. MS Sroka SAYS THAT SHE INJURED HER RIGHT HAND BY PULLING LINEN AND IT HAS SWELLING IN IT. SHE SAYS THAT SHE JUST STARTED A JOB 2 WEEKS AGO AND NEEDS A NOTE. 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  pt says she used fast pain spray.... pt says last night she could not hardly sleep because of the pain... pt says she feels as if she popped something... pt says she can barely move hand... pt wants a doctor note for today...Marland Kitchen pt is coming in tomorrow for appt Follow-up by: [place] Shannon,  March 03, 2010 1:00 PM  Additional Follow-up for Phone Call Additional follow up Details #1::        will speak with pt when she comes in tomorrow Additional Follow-up by: Lehman Prom FNP,  March 03, 2010 5:54 PM

## 2010-12-20 NOTE — Letter (Signed)
Summary: REFERRAL/DENTAL  REFERRAL/DENTAL   Imported By: Arta Bruce 12/30/2009 10:23:09  _____________________________________________________________________  External Attachment:    Type:   Image     Comment:   External Document

## 2010-12-20 NOTE — Assessment & Plan Note (Signed)
Summary: Diabetes/HTN   Vital Signs:  Patient profile:   55 year old female Menstrual status:  perimenopausal Weight:      215.1 pounds Temp:     98.5 degrees F oral Pulse rate:   86 / minute Pulse rhythm:   regular Resp:     18 per minute BP sitting:   201 / 105  (left arm) Cuff size:   regular  Vitals Entered By: Gaylyn Cheers RN (November 30, 2009 11:29 AM) CC: F/U DM; has not been taking medications regularly due to nausea, Hypertension Management, Lipid Management, Abdominal Pain Is Patient Diabetic? Yes Did you bring your meter with you today? No Pain Assessment Patient in pain? yes     Location: abdomen Intensity: 7 Type: sharp Onset of pain  Intermittent CBG Result 361 CBG Device ID [id number]  Does patient need assistance? Functional Status Self care Ambulation Normal Comments did not bring meds   CC:  F/U DM; has not been taking medications regularly due to nausea, Hypertension Management, Lipid Management, and Abdominal Pain.  History of Present Illness:  Pt into the office to f/u on diabetes.  Diabetes - Nausea.  Reports that she is not able to take her oral agents due to nausea.   She is supposed to also take insulin nightly and three times a day. Appetite has decreased as well due to nausea so she is not eating therefore she thinks that she should not take her insulin if she does not eat. "As soon as I eat I have to have something sweet to eat"  Diabetes Management History:      The patient is a 55 years old female who comes in for evaluation of DM Type 2.  She has not been enrolled in the "Diabetic Education Program".  She states understanding of dietary principles but she is not following the appropriate diet.  Sensory loss is noted.  Self foot exams are not being performed.  She is checking home blood sugars.  She says that she is not exercising regularly.        Hyperglycemic symptoms include blurred vision.        The following changes have been made to her  treatment plan since last visit: medication changes.  Treatment plan changes were initiated by patient.  Dietary compliance is reported to be fair.    Dyspepsia History:      She has no alarm features of dyspepsia including no history of melena, hematochezia, dysphagia, persistent vomiting, or involuntary weight loss > 5%.  There is a prior history of GERD.  The patient does not have a prior history of documented ulcer disease.  The dominant symptom is heartburn or acid reflux.  An H-2 blocker medication is not currently being taken.  She has no history of a positive H. Pylori serology.  No previous upper endoscopy has been done.    Hypertension History:      She denies headache, chest pain, and palpitations.  She notes no problems with any antihypertensive medication side effects.  Pt reports that she has taken her pills already today.        Positive major cardiovascular risk factors include diabetes, hyperlipidemia, and hypertension.  Negative major cardiovascular risk factors include female age less than 16 years old, negative family history for ischemic heart disease, and non-tobacco-user status.        Further assessment for target organ damage reveals no history of ASHD, cardiac end-organ damage (CHF/LVH), stroke/TIA, peripheral vascular disease, renal insufficiency,  or hypertensive retinopathy.    Lipid Management History:      Positive NCEP/ATP III risk factors include diabetes and hypertension.  Negative NCEP/ATP III risk factors include female age less than 64 years old, no family history for ischemic heart disease, non-tobacco-user status, no ASHD (atherosclerotic heart disease), no prior stroke/TIA, no peripheral vascular disease, and no history of aortic aneurysm.        Adjunctive measures started by the patient include ASA.  She expresses no side effects from her lipid-lowering medication.  The patient denies any symptoms to suggest myopathy or liver disease.     Allergies  (verified): 1)  ! * Seaweed  Review of Systems CV:  Denies chest pain or discomfort. Resp:  Complains of cough; improving. GI:  Complains of nausea; denies vomiting. Neuro:  Denies headaches.  Physical Exam  General:  alert.   Head:  normocephalic.   Lungs:  normal breath sounds.   Heart:  normal rate and regular rhythm.   Abdomen:  normal bowel sounds.   Msk:  normal ROM.   Neurologic:  alert & oriented X3.     Impression & Recommendations:  Problem # 1:  DIABETES MELLITUS, TYPE II, UNCONTROLLED (ICD-250.02) STILL uncontrolled Continued education to the pt with effects of diabetes insulin syringes given to pt in office Her updated medication list for this problem includes:    Adult Aspirin Ec Low Strength 81 Mg Tbec (Aspirin) .Marland Kitchen... Take one tablet every day    Lisinopril 40 Mg Tabs (Lisinopril) .Marland Kitchen... 1 tablet by mouth daily for blood pressure and kidneys    Lantus 100 Unit/ml Soln (Insulin glargine) .Marland KitchenMarland KitchenMarland KitchenMarland Kitchen 70 units subcutaneously nightly **pharmacy - will change pt to vial since her dose is so high**    Novolog Flexpen 100 Unit/ml Soln (Insulin aspart) .Marland KitchenMarland KitchenMarland KitchenMarland Kitchen 15 units subcutaneously three times a day  Problem # 2:  HYPERTENSION, BENIGN ESSENTIAL (ICD-401.1) VERY elevated today will attempt to get pt on a schedule for medications Her updated medication list for this problem includes:    Tiazac 360 Mg Xr24h-cap (Diltiazem hcl er beads) .Marland Kitchen... Take one capsule daily for blood presure    Lisinopril 40 Mg Tabs (Lisinopril) .Marland Kitchen... 1 tablet by mouth daily for blood pressure and kidneys  Problem # 3:  HYPERCHOLESTEROLEMIA (ICD-272.0)  Her updated medication list for this problem includes:    Pravastatin Sodium 20 Mg Tabs (Pravastatin sodium) .Marland Kitchen... 1 tablet by mouth nighty for cholesterol  Problem # 4:  GASTROPARESIS (ICD-536.3)  advised pt that this is likely what is causing her nausea. will order reglan before meals  Problem # 5:  GERD (ICD-530.81) handout given on foods to  avoid Her updated medication list for this problem includes:    Nexium 40 Mg Cpdr (Esomeprazole magnesium) .Marland Kitchen... 1 tablet by mouth daily for stomach  Complete Medication List: 1)  Adult Aspirin Ec Low Strength 81 Mg Tbec (Aspirin) .... Take one tablet every day 2)  Tiazac 360 Mg Xr24h-cap (Diltiazem hcl er beads) .... Take one capsule daily for blood presure 3)  Therapeutic M Tabs (Multiple vitamins-minerals) .... One tablet by mouth daily 4)  Oyster Shell Calcium/d 500-125 Mg-unit Tabs (Calcium-vitamin d) .... One table by mouth two times a day 5)  Lisinopril 40 Mg Tabs (Lisinopril) .Marland Kitchen.. 1 tablet by mouth daily for blood pressure and kidneys 6)  Truetrack Test Strp (Glucose blood) .... Check blood sugar two times a day 7)  Lantus 100 Unit/ml Soln (Insulin glargine) .... 70 units subcutaneously nightly **pharmacy -  will change pt to vial since her dose is so high** 8)  Unifine Pentips 31g X 6 Mm Misc (Insulin pen needle) .... Use with lantus insulin once at night 9)  Nexium 40 Mg Cpdr (Esomeprazole magnesium) .Marland Kitchen.. 1 tablet by mouth daily for stomach 10)  Loratadine 10 Mg Tabs (Loratadine) .... One tablet by mouth daily for allergies 11)  Pravastatin Sodium 20 Mg Tabs (Pravastatin sodium) .Marland Kitchen.. 1 tablet by mouth nighty for cholesterol 12)  Flexeril 5 Mg Tabs (Cyclobenzaprine hcl) .... One tablet by mouth nightly as needed for cramps 13)  Novolog Flexpen 100 Unit/ml Soln (Insulin aspart) .Marland Kitchen.. 15 units subcutaneously three times a day 14)  Ultram 50 Mg Tabs (Tramadol hcl) .... One tablet by mouth two times a day as needed for pain 15)  Bd Insulin Syringe Ultrafine 29g X 1/2" 1 Ml Misc (Insulin syringe-needle u-100) .... Use with lantus insulin nightly 16)  Lancets Misc (Lancets) .... Use to check blood sugar three times a day 17)  Reglan 10 Mg Tabs (Metoclopramide hcl) .... One tablet by mouth 30 minutes before meals  Other Orders: Capillary Blood Glucose/CBG (16109)  Diabetes Management  Assessment/Plan:      The following lipid goals have been established for the patient: Total cholesterol goal of 200; LDL cholesterol goal of 100; HDL cholesterol goal of 40; Triglyceride goal of 150.  Her blood pressure goal is < 130/80.    Hypertension Assessment/Plan:      The patient's hypertensive risk group is category C: Target organ damage and/or diabetes.  Her calculated 10 year risk of coronary heart disease is 17 %.  Today's blood pressure is 201/105.  Her blood pressure goal is < 130/80.  Lipid Assessment/Plan:      Based on NCEP/ATP III, the patient's risk factor category is "history of diabetes".  The patient's lipid goals are as follows: Total cholesterol goal is 200; LDL cholesterol goal is 100; HDL cholesterol goal is 40; Triglyceride goal is 150.  Her LDL cholesterol goal has not been met.      Patient Instructions: 1)  Your blood sugar and blood pressure is still high. 2)  TAKE YOUR MEDICATIONS ACCORDING TO THIS SCHEDULE 3)  Nausea is likely from high blood sugars.  4)  Take Reglan AND Nexium 30 minutes before breakfast 5)  Eat breakfast 6)  Then take pills(Diltiazem, Lisinopril, Aspirin, Vitamin) and insulin 15 units 7)  Take Reglan 30 minutes before lunch 8)  Eat lunch 9)  Then take insulin 15 units 10)  Take Reglan 30 minutes before dinner 11)  Eat dinner 12)  Take insulin 15 units 13)  Bedtime - Take pravastatin (cholesterol) and lantus 70 units  14)  Follow up in 6 weeks for diabetes and high blood pressure. 15)  Will need cbg, hgba1c, u/a Prescriptions: REGLAN 10 MG TABS (METOCLOPRAMIDE HCL) One tablet by mouth 30 minutes before meals  #90 x 3   Entered and Authorized by:   Lehman Prom FNP   Signed by:   Lehman Prom FNP on 11/30/2009   Method used:   Faxed to ...       [facility] (retail)       [address] [place], [place]  [address]       Ph: [phone]       Fax: [phone]   RxID:    [id number]

## 2010-12-20 NOTE — Assessment & Plan Note (Signed)
Summary: Diabetes/HTN   Vital Signs:  Patient profile:   55 year old female Menstrual status:  perimenopausal Weight:      211.4 pounds BMI:     30.01 BSA:     2.15 Temp:     97.5 degrees F oral Pulse rate:   75 / minute Pulse rhythm:   regular Resp:     20 per minute BP sitting:   192 / 85  (left arm) Cuff size:   large  Vitals Entered By: Levon Hedger (Apr 11, 2010 10:03 AM) CC: follow-up visit ....right wrist pain, Hypertension Management, Lipid Management Is Patient Diabetic? Yes Pain Assessment Patient in pain? yes     Location: wrist Intensity: 9 Onset of pain  Constant CBG Result 334 CBG Device ID B  Does patient need assistance? Functional Status Self care Ambulation Normal   CC:  follow-up visit ....right wrist pain, Hypertension Management, and Lipid Management.  History of Present Illness:  Pt into the office for follow up - diabetes    Diabetes Management History:      The patient is a 55 years old female who comes in for evaluation of DM Type 2.  She has not been enrolled in the "Diabetic Education Program".  She states lack of understanding of dietary principles and is not following her diet appropriately.  No sensory loss is reported.  Self foot exams are not being performed.  She is checking home blood sugars.  She says that she is not exercising regularly.        Hypoglycemic symptoms are not occurring.  No hyperglycemic symptoms are reported.        The following changes have been made to her treatment plan since last visit: insulin dosing.  Treatment plan changes were initiated by patient.    Hypertension History:      She denies headache, chest pain, and palpitations.  Pt still states that she has been taking Tiazac and lisinoprl.  Further comments include: Pt has purchased a blood pressure machine.        Positive major cardiovascular risk factors include diabetes, hyperlipidemia, and hypertension.  Negative major cardiovascular risk factors  include female age less than 12 years old, negative family history for ischemic heart disease, and non-tobacco-user status.        Further assessment for target organ damage reveals no history of ASHD, cardiac end-organ damage (CHF/LVH), stroke/TIA, peripheral vascular disease, renal insufficiency, or hypertensive retinopathy.    Lipid Management History:      Positive NCEP/ATP III risk factors include diabetes and hypertension.  Negative NCEP/ATP III risk factors include female age less than 79 years old, no family history for ischemic heart disease, non-tobacco-user status, no ASHD (atherosclerotic heart disease), no prior stroke/TIA, no peripheral vascular disease, and no history of aortic aneurysm.        The patient states that she knows about the "Therapeutic Lifestyle Change" diet.  Her compliance with the TLC diet is poor.      Allergies (verified): 1)  ! * Seaweed  Review of Systems CV:  Denies chest pain or discomfort. Resp:  Denies cough. GI:  Denies abdominal pain, nausea, and vomiting. MS:  Complains of joint pain; right wrist - seen at urgent care and dx with tendinitis. Neuro:  Denies headaches.  Physical Exam  General:  alert.   Head:  normocephalic.   Lungs:  normal breath sounds.   Heart:  normal rate and regular rhythm.   Msk:  up to the  exam Neurologic:  alert & oriented X3.   Skin:  color normal.   Psych:  Oriented X3.    Diabetes Management Exam:    Foot Exam (with socks and/or shoes not present):       Sensory-Monofilament:          Left foot: normal          Right foot: normal       Nails:          Left foot: thickened          Right foot: thickened   Impression & Recommendations:  Problem # 1:  DIABETES MELLITUS, TYPE II, UNCONTROLLED (ICD-250.02) Hgba1c is still elevated advised pt of the correct amount of insulin  Her updated medication list for this problem includes:    Adult Aspirin Ec Low Strength 81 Mg Tbec (Aspirin) .Marland Kitchen... Take one tablet  every day    Lisinopril 40 Mg Tabs (Lisinopril) .Marland Kitchen... 1 tablet by mouth daily for blood pressure and kidneys    Lantus 100 Unit/ml Soln (Insulin glargine) .Marland KitchenMarland KitchenMarland KitchenMarland Kitchen 70 units subcutaneously nightly for diabetes    Novolog Flexpen 100 Unit/ml Soln (Insulin aspart) .Marland KitchenMarland KitchenMarland KitchenMarland Kitchen 15 units subcutaneously three times a day  Orders: Hemoglobin A1C (83036)  Problem # 2:  HYPERTENSION, BENIGN ESSENTIAL (ICD-401.1) BP is elevated.  will add bystolic to the pt's regimen again reviewed with pt the importance of BP control Her updated medication list for this problem includes:    Tiazac 360 Mg Xr24h-cap (Diltiazem hcl er beads) .Marland Kitchen... Take one capsule daily for blood presure    Lisinopril 40 Mg Tabs (Lisinopril) .Marland Kitchen... 1 tablet by mouth daily for blood pressure and kidneys    Bystolic 5 Mg Tabs (Nebivolol hcl) ..... One tablet by mouth daily  Problem # 3:  HAND PAIN, RIGHT (ICD-729.5) hand splint in place  Problem # 4:  HYPERCHOLESTEROLEMIA (ICD-272.0)  Her updated medication list for this problem includes:    Pravastatin Sodium 20 Mg Tabs (Pravastatin sodium) .Marland Kitchen... 1 tablet by mouth nighty for cholesterol  Complete Medication List: 1)  Adult Aspirin Ec Low Strength 81 Mg Tbec (Aspirin) .... Take one tablet every day 2)  Tiazac 360 Mg Xr24h-cap (Diltiazem hcl er beads) .... Take one capsule daily for blood presure 3)  Therapeutic M Tabs (Multiple vitamins-minerals) .... One tablet by mouth daily 4)  Oyster Shell Calcium/d 500-125 Mg-unit Tabs (Calcium-vitamin d) .... One table by mouth two times a day 5)  Lisinopril 40 Mg Tabs (Lisinopril) .Marland Kitchen.. 1 tablet by mouth daily for blood pressure and kidneys 6)  Truetrack Test Strp (Glucose blood) .... Check blood sugar two times a day 7)  Lantus 100 Unit/ml Soln (Insulin glargine) .... 70 units subcutaneously nightly for diabetes 8)  Unifine Pentips 31g X 6 Mm Misc (Insulin pen needle) .... Use with lantus insulin once at night 9)  Nexium 40 Mg Cpdr (Esomeprazole  magnesium) .Marland Kitchen.. 1 tablet by mouth daily for stomach 10)  Loratadine 10 Mg Tabs (Loratadine) .... One tablet by mouth daily for allergies 11)  Pravastatin Sodium 20 Mg Tabs (Pravastatin sodium) .Marland Kitchen.. 1 tablet by mouth nighty for cholesterol 12)  Flexeril 5 Mg Tabs (Cyclobenzaprine hcl) .... One tablet by mouth nightly as needed for cramps 13)  Novolog Flexpen 100 Unit/ml Soln (Insulin aspart) .Marland Kitchen.. 15 units subcutaneously three times a day 14)  Ultram 50 Mg Tabs (Tramadol hcl) .... One tablet by mouth two times a day as needed for pain 15)  Bd Insulin Syringe Ultrafine 29g  X 1/2" 1 Ml Misc (Insulin syringe-needle u-100) .... Use with lantus insulin nightly 16)  Lancets Misc (Lancets) .... Use to check blood sugar three times a day 17)  Reglan 10 Mg Tabs (Metoclopramide hcl) .... One tablet by mouth 30 minutes before meals 18)  Blood Glucose Meter Kit (Blood glucose monitoring suppl) .... Dispense glucometer and testing supplies for four times daily testing 19)  Bystolic 5 Mg Tabs (Nebivolol hcl) .... One tablet by mouth daily  Other Orders: Capillary Blood Glucose/CBG (04540) Dental Referral (Dentist)  Diabetes Management Assessment/Plan:      The following lipid goals have been established for the patient: Total cholesterol goal of 200; LDL cholesterol goal of 100; HDL cholesterol goal of 40; Triglyceride goal of 150.  Her blood pressure goal is < 130/80.    Hypertension Assessment/Plan:      The patient's hypertensive risk group is category C: Target organ damage and/or diabetes.  Her calculated 10 year risk of coronary heart disease is 17 %.  Today's blood pressure is 192/85.  Her blood pressure goal is < 130/80.  Lipid Assessment/Plan:      Based on NCEP/ATP III, the patient's risk factor category is "history of diabetes".  The patient's lipid goals are as follows: Total cholesterol goal is 200; LDL cholesterol goal is 100; HDL cholesterol goal is 40; Triglyceride goal is 150.  Her LDL  cholesterol goal has not been met.    Patient Instructions: 1)  ALL YOUR MEDICATIONS SHOULD BE AT THE PHARMACY. 2)  PLEASE PICK THEM UP 3)  Blood pressure is still elevated 4)  You should be taking Tiazac and lisinopril by mouth daily 5)  Add Bystolic 5mg  by mouth daily 6)  Blood sugar - make sure you are taking insulin as ordered 7)  Follow up in 2-3 weeks for a blood pressure check 8)  Take all 3 blood pressure medications before this appointment 9)  Follow up in 3 months with n.martin,fnp for diabetes Prescriptions: BYSTOLIC 5 MG TABS (NEBIVOLOL HCL) One tablet by mouth daily  #28 x 0   Entered and Authorized by:   Lehman Prom FNP   Signed by:   Lehman Prom FNP on 04/11/2010   Method used:   Samples Given   RxID:   [id number]    Last LDL:                                                 96 (09/10/2009 9:18:00 PM)        Diabetic Foot Exam Last Podiatry Exam Date: 07/21/2009    10-g (5.07) Semmes-Weinstein Monofilament Test Performed by: Levon Hedger          Right Foot          Left Foot Visual Inspection               Test Control      normal         normal Site 1         normal         normal Site 2         abnormal         abnormal Site 3         normal         normal Site 4  normal         normal Site 5         normal         normal Site 6         normal         normal Site 7         normal         normal Site 8         normal         normal Site 9         normal         normal Site 10         normal         normal  Impression      normal         normal   Laboratory Results   Blood Tests   Date/Time Received: Apr 11, 2010 10:20 AM   HGBA1C: 12.2%   (Normal Range: Non-Diabetic - 3-6%   Control Diabetic - 6-8%) CBG Random:: 334

## 2010-12-20 NOTE — Assessment & Plan Note (Signed)
Summary: Diabetes/HTN   Vital Signs:  Patient profile:   55 year old female Menstrual status:  perimenopausal Weight:      217.7 pounds BMI:     30.91 BSA:     2.18 Temp:     98.0 degrees F oral Pulse rate:   76 / minute Pulse rhythm:   regular Resp:     20 per minute BP sitting:   153 / 83  (left arm) Cuff size:   regular  Vitals Entered By: Levon Hedger (January 11, 2010 11:34 AM) CC: follow-up visit 6 week DM, Lipid Management, Hypertension Management, Abdominal Pain Is Patient Diabetic? Yes Pain Assessment Patient in pain? no      CBG Result 319 CBG Device ID A  Does patient need assistance? Functional Status Self care Ambulation Normal   CC:  follow-up visit 6 week DM, Lipid Management, Hypertension Management, and Abdominal Pain.  History of Present Illness:  pt into the office for f/u on diabetes "I am doing better"  No medications present today in office - Pt advised to bring meds to all visits Pharmacy profile sheet reviewed  Diabetes Management History:      The patient is a 55 years old female who comes in for evaluation of DM Type 2.  She has not been enrolled in the "Diabetic Education Program".  She states understanding of dietary principles but she is not following the appropriate diet.  No sensory loss is reported.  Self foot exams are not being performed.  She is checking home blood sugars.  She says that she is not exercising regularly.        Hypoglycemic symptoms are not occurring.  No hyperglycemic symptoms are reported.  Other comments include: Pt admits that she does not take her medications as ordered - for no real reason other than she just doesn't take it.        The following changes have been made to her treatment plan since last visit: medication changes.  Treatment plan changes were initiated by patient.    Dyspepsia History:      She has no alarm features of dyspepsia including no history of melena, hematochezia, dysphagia,  persistent vomiting, or involuntary weight loss > 5%.  There is a prior history of GERD.  The patient does not have a prior history of documented ulcer disease.  The dominant symptom is not heartburn or acid reflux.  An H-2 blocker medication is not currently being taken.  She has no history of a positive H. Pylori serology.  No previous upper endoscopy has been done.    Hypertension History:      She denies headache, chest pain, and palpitations.  She notes no problems with any antihypertensive medication side effects.  Still missing some dosages of medications but is taking MORE routinely now.        Positive major cardiovascular risk factors include diabetes, hyperlipidemia, and hypertension.  Negative major cardiovascular risk factors include female age less than 1 years old, negative family history for ischemic heart disease, and non-tobacco-user status.        Further assessment for target organ damage reveals no history of ASHD, cardiac end-organ damage (CHF/LVH), stroke/TIA, peripheral vascular disease, renal insufficiency, or hypertensive retinopathy.    Lipid Management History:      Positive NCEP/ATP III risk factors include diabetes and hypertension.  Negative NCEP/ATP III risk factors include female age less than 71 years old, no family history for ischemic heart disease, non-tobacco-user status, no  ASHD (atherosclerotic heart disease), no prior stroke/TIA, no peripheral vascular disease, and no history of aortic aneurysm.        The patient states that she knows about the "Therapeutic Lifestyle Change" diet.  Her compliance with the TLC diet is poor.  The patient does not know about adjunctive measures for cholesterol lowering.  She expresses no side effects from her lipid-lowering medication.  The patient denies any symptoms to suggest myopathy or liver disease.     Habits & Providers  Alcohol-Tobacco-Diet     Alcohol drinks/day: 0     Tobacco Status:  never  Exercise-Depression-Behavior     Does Patient Exercise: no     Depression Counseling: further diagnostic testing and/or other treatment is indicated     Drug Use: no     Seat Belt Use: 100     Sun Exposure: occasionally  Comments: Pt has not been to see the LCSW as advised during previous visits  Allergies (verified): 1)  ! * Seaweed  Review of Systems General:  Denies fever. ENT:  Complains of nasal congestion; denies earache and sore throat. CV:  Denies chest pain or discomfort. Resp:  Complains of cough. GI:  Denies abdominal pain, diarrhea, indigestion, vomiting, and vomiting blood.  Physical Exam  General:  alert.   Head:  normocephalic.   Lungs:  normal breath sounds.   Heart:  normal rate and regular rhythm.   Abdomen:  normal bowel sounds.   Msk:  up to the exam table Neurologic:  alert & oriented X3.   Skin:  color normal.   Psych:  Oriented X3.     Impression & Recommendations:  Problem # 1:  DIABETES MELLITUS, TYPE II, UNCONTROLLED (ICD-250.02) still uncontrolled still expressed to pt the importance of taking her insulin as ordered Rx for new glucometer sent to pharmacy Her updated medication list for this problem includes:    Adult Aspirin Ec Low Strength 81 Mg Tbec (Aspirin) .Marland Kitchen... Take one tablet every day    Lisinopril 40 Mg Tabs (Lisinopril) .Marland Kitchen... 1 tablet by mouth daily for blood pressure and kidneys    Lantus 100 Unit/ml Soln (Insulin glargine) .Marland KitchenMarland KitchenMarland KitchenMarland Kitchen 70 units subcutaneously nightly **pharmacy - will change pt to vial since her dose is so high**    Novolog Flexpen 100 Unit/ml Soln (Insulin aspart) .Marland KitchenMarland KitchenMarland KitchenMarland Kitchen 15 units subcutaneously three times a day  Orders: Hemoglobin A1C (83036)  Problem # 2:  HYPERTENSION, BENIGN ESSENTIAL (ICD-401.1) improved but still elevated again advised pt to take meds as ordered Her updated medication list for this problem includes:    Tiazac 360 Mg Xr24h-cap (Diltiazem hcl er beads) .Marland Kitchen... Take one capsule daily for  blood presure    Lisinopril 40 Mg Tabs (Lisinopril) .Marland Kitchen... 1 tablet by mouth daily for blood pressure and kidneys  Problem # 3:  DEPRESSION (ICD-311) pt did not follow through with referral as suggested  Problem # 4:  HYPERCHOLESTEROLEMIA (ICD-272.0)  Her updated medication list for this problem includes:    Pravastatin Sodium 20 Mg Tabs (Pravastatin sodium) .Marland Kitchen... 1 tablet by mouth nighty for cholesterol  Complete Medication List: 1)  Adult Aspirin Ec Low Strength 81 Mg Tbec (Aspirin) .... Take one tablet every day 2)  Tiazac 360 Mg Xr24h-cap (Diltiazem hcl er beads) .... Take one capsule daily for blood presure 3)  Therapeutic M Tabs (Multiple vitamins-minerals) .... One tablet by mouth daily 4)  Oyster Shell Calcium/d 500-125 Mg-unit Tabs (Calcium-vitamin d) .... One table by mouth two times a day 5)  Lisinopril 40 Mg Tabs (Lisinopril) .Marland Kitchen.. 1 tablet by mouth daily for blood pressure and kidneys 6)  Truetrack Test Strp (Glucose blood) .... Check blood sugar two times a day 7)  Lantus 100 Unit/ml Soln (Insulin glargine) .... 70 units subcutaneously nightly **pharmacy - will change pt to vial since her dose is so high** 8)  Unifine Pentips 31g X 6 Mm Misc (Insulin pen needle) .... Use with lantus insulin once at night 9)  Nexium 40 Mg Cpdr (Esomeprazole magnesium) .Marland Kitchen.. 1 tablet by mouth daily for stomach 10)  Loratadine 10 Mg Tabs (Loratadine) .... One tablet by mouth daily for allergies 11)  Pravastatin Sodium 20 Mg Tabs (Pravastatin sodium) .Marland Kitchen.. 1 tablet by mouth nighty for cholesterol 12)  Flexeril 5 Mg Tabs (Cyclobenzaprine hcl) .... One tablet by mouth nightly as needed for cramps 13)  Novolog Flexpen 100 Unit/ml Soln (Insulin aspart) .Marland Kitchen.. 15 units subcutaneously three times a day 14)  Ultram 50 Mg Tabs (Tramadol hcl) .... One tablet by mouth two times a day as needed for pain 15)  Bd Insulin Syringe Ultrafine 29g X 1/2" 1 Ml Misc (Insulin syringe-needle u-100) .... Use with lantus  insulin nightly 16)  Lancets Misc (Lancets) .... Use to check blood sugar three times a day 17)  Reglan 10 Mg Tabs (Metoclopramide hcl) .... One tablet by mouth 30 minutes before meals 18)  Blood Glucose Meter Kit (Blood glucose monitoring suppl) .... Dispense glucometer and testing supplies for four times daily testing  Other Orders: Capillary Blood Glucose/CBG (61607)  Diabetes Management Assessment/Plan:      The following lipid goals have been established for the patient: Total cholesterol goal of 200; LDL cholesterol goal of 100; HDL cholesterol goal of 40; Triglyceride goal of 150.  Her blood pressure goal is < 130/80.    Hypertension Assessment/Plan:      The patient's hypertensive risk group is category C: Target organ damage and/or diabetes.  Her calculated 10 year risk of coronary heart disease is 15 %.  Today's blood pressure is 153/83.  Her blood pressure goal is < 130/80.  Lipid Assessment/Plan:      Based on NCEP/ATP III, the patient's risk factor category is "history of diabetes".  The patient's lipid goals are as follows: Total cholesterol goal is 200; LDL cholesterol goal is 100; HDL cholesterol goal is 40; Triglyceride goal is 150.  Her LDL cholesterol goal has not been met.      Patient Instructions: 1)  Your Hgba1c = 12.2 (better than 14) slow improvement 2)  The goal is less than 7 3)  Blood pressure is doing better. 4)  Cough - [place] HBP is available over the counter. 5)  Follow up in 2 months for diabetes and blood pressure. 6)  Bring all your medications into this office. Prescriptions: BLOOD GLUCOSE METER  KIT (BLOOD GLUCOSE MONITORING SUPPL) Dispense glucometer and testing supplies for four times daily testing  #1 month qs x 5   Entered and Authorized by:   Lehman Prom FNP   Signed by:   Lehman Prom FNP on 01/13/2010   Method used:   Printed then faxed to ...       [facility] (retail)       [address]  [place], [place]  [address]       Ph: [phone]       Fax: [phone]   RxID:   [id number] NEXIUM 40 MG CPDR (  ESOMEPRAZOLE MAGNESIUM) 1 tablet by mouth daily for stomach  #30 x 5   Entered and Authorized by:   Lehman Prom FNP   Signed by:   Lehman Prom FNP on 01/11/2010   Method used:   Print then Give to Patient   RxID:   [id number]   Laboratory Results   Blood Tests   Date/Time Received: January 11, 2010 11:58 AM   HGBA1C: 12.2%   (Normal Range: Non-Diabetic - 3-6%   Control Diabetic - 6-8%) CBG Random:: 319

## 2011-01-30 ENCOUNTER — Other Ambulatory Visit: Payer: Self-pay | Admitting: Nurse Practitioner

## 2011-01-30 ENCOUNTER — Encounter: Payer: Self-pay | Admitting: Nurse Practitioner

## 2011-01-30 ENCOUNTER — Other Ambulatory Visit (HOSPITAL_COMMUNITY)
Admission: RE | Admit: 2011-01-30 | Discharge: 2011-01-30 | Disposition: A | Discharge status: Home / Self Care | Payer: Self-pay | Source: Ambulatory Visit | Attending: Family Medicine | Admitting: Family Medicine

## 2011-01-30 ENCOUNTER — Encounter (INDEPENDENT_AMBULATORY_CARE_PROVIDER_SITE_OTHER): Payer: Self-pay | Admitting: Nurse Practitioner

## 2011-01-30 DIAGNOSIS — Z01419 Encounter for gynecological examination (general) (routine) without abnormal findings: Secondary | ICD-10-CM | POA: Insufficient documentation

## 2011-01-30 LAB — CYTOLOGY - PAP

## 2011-01-30 LAB — CONVERTED CEMR LAB
Bilirubin Urine: NEGATIVE
Ketones, urine, test strip: NEGATIVE
Protein, U semiquant: NEGATIVE
Urobilinogen, UA: 1

## 2011-02-01 ENCOUNTER — Encounter (INDEPENDENT_AMBULATORY_CARE_PROVIDER_SITE_OTHER): Payer: Self-pay | Admitting: Nurse Practitioner

## 2011-02-01 LAB — CONVERTED CEMR LAB
Chlamydia, DNA Probe: NEGATIVE
Cholesterol: 194 mg/dL (ref 0–200)
GC Probe Amp, Genital: NEGATIVE
HDL: 54 mg/dL (ref >39)
Triglycerides: 127 mg/dL (ref <150)

## 2011-02-03 ENCOUNTER — Telehealth (INDEPENDENT_AMBULATORY_CARE_PROVIDER_SITE_OTHER): Payer: Self-pay | Admitting: Nurse Practitioner

## 2011-02-05 LAB — CBC
HCT: 42.8 % (ref 36.0–46.0)
Hemoglobin: 14.2 g/dL (ref 12.0–15.0)
MCH: 29.2 pg (ref 26.0–34.0)
MCV: 87.8 fL (ref 78.0–100.0)
Platelets: 200 10*3/uL (ref 150–400)
RBC: 4.88 MIL/uL (ref 3.87–5.11)

## 2011-02-05 LAB — SAMPLE TO BLOOD BANK

## 2011-02-07 LAB — URIC ACID: Uric Acid, Serum: 3.9 mg/dL (ref 2.4–7.0)

## 2011-02-07 NOTE — Assessment & Plan Note (Signed)
Summary: Complete Physical Exam   Vital Signs:  Patient profile:   55 year old female Menstrual status:  perimenopausal Weight:      208.1 pounds BMI:     29.54 Temp:     98.5 degrees F oral Pulse rate:   80 / minute Pulse rhythm:   regular Resp:     20 per minute BP sitting:   164 / 83  (left arm) Cuff size:   regular  Vitals Entered By: Levon Hedger (January 30, 2011 10:56 AM)  Nutrition Counseling: Patient's BMI is greater than 25 and therefore counseled on weight management options. CC: CPP, Hypertension Management, Lipid Management Is Patient Diabetic? No Pain Assessment Patient in pain? no       Does patient need assistance? Functional Status Self care Ambulation Normal  Vision Screening:Left eye w/o correction: 20 / 25 Right Eye w/o correction: 20 / 40 Both eyes w/o correction:  20/ 25        Vision Entered By: Levon Hedger (January 30, 2011 10:58 AM)   CC:  CPP, Hypertension Management, and Lipid Management.  History of Present Illness:  Pt into the office for a complete physical exam  Pap - last done in 2010 no family history of cervical or ovarian cancer  Mammogram - last done 11/04/2010 no breast exams at home  Optho - Pt needs an eye exam.  She has diabetes and admits to some decrease in vision. She wears some reading glasses.   Dental - last recent dental exam. She needs multiple dental extractions  Medications - none present today during exam Pt admits that she has not had the money to be completely complaint with meds. pharmacy list shows that pt picked up MOST meds 2/12 but in particular her insulin has not been dispensed since 09/2010  Pt presents with handicap placcard form and requests that provider complete - reviewed with pt that she does not meet the requirements  Diabetes Management History:      The patient is a 55 years old female who comes in for evaluation of DM Type 2.  She has not been enrolled in the "Diabetic  Education Program".  She states understanding of dietary principles and is following her diet appropriately.  Sensory loss is noted.  Self foot exams are being performed.  She is not checking home blood sugars.  She says that she is not exercising regularly.        Hypoglycemic symptoms are not occurring.  No hyperglycemic symptoms are reported.  Other comments include: Pt reports that her blood sugar has still been elevated. She speaks at every visit that her father has diabetes and that it was difficult to control so she seems convinced that her uncontrolled diabetes is hereditary.        The following changes have been made to her treatment plan since last visit: insulin dosing.  Treatment plan changes were initiated by patient.    Hypertension History:      She denies headache, chest pain, and palpitations.  ? compliance with meds.        Positive major cardiovascular risk factors include diabetes, hyperlipidemia, and hypertension.  Negative major cardiovascular risk factors include female age less than 11 years old, negative family history for ischemic heart disease, and non-tobacco-user status.        Further assessment for target organ damage reveals no history of ASHD, cardiac end-organ damage (CHF/LVH), stroke/TIA, peripheral vascular disease, renal insufficiency, or hypertensive retinopathy.    Lipid  Management History:      Positive NCEP/ATP III risk factors include diabetes and hypertension.  Negative NCEP/ATP III risk factors include female age less than 65 years old, no family history for ischemic heart disease, non-tobacco-user status, no ASHD (atherosclerotic heart disease), no prior stroke/TIA, no peripheral vascular disease, and no history of aortic aneurysm.        The patient states that she knows about the "Therapeutic Lifestyle Change" diet.  Her compliance with the TLC diet is poor.      Habits & Providers  Alcohol-Tobacco-Diet     Alcohol drinks/day: 0     Tobacco Status:  never  Exercise-Depression-Behavior     Does Patient Exercise: no     Have you felt down or hopeless? no     Have you felt little pleasure in things? no     Depression Counseling: not indicated; screening negative for depression     Drug Use: no     Seat Belt Use: 100     Sun Exposure: occasionally  Allergies (verified): 1)  ! * Seaweed  Review of Systems General:  Denies fever. Eyes:  Complains of blurring. ENT:  Denies earache. CV:  Denies fatigue. Resp:  Denies cough. GI:  Denies abdominal pain, nausea, and vomiting. GU:  Denies discharge. MS:  Complains of joint pain. Derm:  Denies lesion(s). Neuro:  Denies headaches. Psych:  Denies anxiety and depression. Endo:  Denies excessive urination.  Physical Exam  General:  alert.   Head:  normocephalic.   Eyes:  pupils reactive to light.   Ears:  bil ears with moderate cerumen Nose:  no nasal discharge.   Mouth:  poor dentition.   Neck:  supple.   Chest Wall:  no deformities.   Lungs:  normal breath sounds.   Heart:  normal rate and regular rhythm.   Abdomen:  normal bowel sounds.   Rectal:  no external abnormalities.   Msk:  decreased ROM.   Pulses:  R radial normal and L radial normal.   Extremities:  no edema Neurologic:  alert & oriented X3.   Skin:  color normal.   Psych:  Oriented X3.    Pelvic Exam  Vulva:      normal appearance.   Urethra and Bladder:      Urethra--normal.   Vagina:      copious discharge.   Cervix:      midposition.   Uterus:      smooth.   Adnexa:      nontender bilaterally.   Rectum:      heme negative stool.    Diabetic Foot Exam Last Podiatry Exam Date: 07/21/2009 Foot Inspection Is there a history of a foot ulcer?              No Is there a foot ulcer now?              No Can the patient see the bottom of their feet?          No Are the shoes appropriate in style and fit?          Yes Is there swelling or an abnormal foot shape?          No Are the toenails long?                 Yes Are the toenails thick?                Yes Are the toenails ingrown?  No Is there heavy callous build-up?              No Is there pain in the calf muscle (Intermittent claudication) when walking?    NoIs there a claw toe deformity?              No Is there elevated skin temperature?            No Is there limited ankle dorsiflexion?            No Is there foot or ankle muscle weakness?            No  Diabetic Foot Care Education Patient educated on appropriate care of diabetic feet.  Pulse Check          Right Foot          Left Foot Dorsalis Pedis:        normal            normal  High Risk Feet? Yes    Impression & Recommendations:  Problem # 1:  ROUTINE GYNECOLOGICAL EXAMINATION (ICD-V72.31) rec optho and dental exam PAP done guaiac  negative Orders: T- GC Chlamydia (16109) KOH/ WET Mount [phone]) Pap Smear, Thin Prep ( Collection of) (U9811) EKG w/ Interpretation (93000) Hemoccult Cards MCR Screening (B1478) Vision Screening (29562) Vision Screening (13086)  Problem # 2:  DIABETES MELLITUS, TYPE II, UNCONTROLLED (ICD-250.02) pt is still non compliant with her meds  Her updated medication list for this problem includes:    Adult Aspirin Ec Low Strength 81 Mg Tbec (Aspirin) .Marland Kitchen... Take one tablet every day    Lisinopril 40 Mg Tabs (Lisinopril) .Marland Kitchen... 1 tablet by mouth daily for blood pressure and kidneys    Lantus 100 Unit/ml Soln (Insulin glargine) .Marland KitchenMarland KitchenMarland KitchenMarland Kitchen 70 units subcutaneously nightly for diabetes    Novolog Flexpen 100 Unit/ml Soln (Insulin aspart) .Marland KitchenMarland KitchenMarland KitchenMarland Kitchen 15 units subcutaneously three times a day  Orders: UA Dipstick w/o Micro (manual) (57846) Capillary Blood Glucose/CBG (82948) T- Hemoglobin A1C (96295-28413) T-Lipid Profile (24401-02725) Rapid HIV  (92370) T-Syphilis Test (RPR) (36644-03474) UA Dipstick w/o Micro (manual) (25956) T-Urine Microalbumin w/creat. ratio [phone])  Problem # 3:  HYPERTENSION, BENIGN ESSENTIAL  (ICD-401.1)  Her updated medication list for this problem includes:    Tiazac 360 Mg Xr24h-cap (Diltiazem hcl er beads) .Marland Kitchen... Take one capsule daily for blood presure    Lisinopril 40 Mg Tabs (Lisinopril) .Marland Kitchen... 1 tablet by mouth daily for blood pressure and kidneys    Bystolic 5 Mg Tabs (Nebivolol hcl) ..... One tablet by mouth daily  Problem # 4:  HYPERCHOLESTEROLEMIA (ICD-272.0)  Her updated medication list for this problem includes:    Pravastatin Sodium 20 Mg Tabs (Pravastatin sodium) .Marland Kitchen... 1 tablet by mouth nighty for cholesterol  Complete Medication List: 1)  Adult Aspirin Ec Low Strength 81 Mg Tbec (Aspirin) .... Take one tablet every day 2)  Tiazac 360 Mg Xr24h-cap (Diltiazem hcl er beads) .... Take one capsule daily for blood presure 3)  Therapeutic M Tabs (Multiple vitamins-minerals) .... One tablet by mouth daily 4)  Oyster Shell Calcium/d 500-125 Mg-unit Tabs (Calcium-vitamin d) .... One table by mouth two times a day 5)  Lisinopril 40 Mg Tabs (Lisinopril) .Marland Kitchen.. 1 tablet by mouth daily for blood pressure and kidneys 6)  Truetrack Test Strp (Glucose blood) .... Check blood sugar two times a day 7)  Lantus 100 Unit/ml Soln (Insulin glargine) .... 70 units subcutaneously nightly for diabetes 8)  Unifine Pentips 31g X 6 Mm Misc (  Insulin pen needle) .... Use with lantus insulin once at night 9)  Nexium 40 Mg Cpdr (Esomeprazole magnesium) .Marland Kitchen.. 1 tablet by mouth daily for stomach 10)  Loratadine 10 Mg Tabs (Loratadine) .... One tablet by mouth daily for allergies 11)  Pravastatin Sodium 20 Mg Tabs (Pravastatin sodium) .Marland Kitchen.. 1 tablet by mouth nighty for cholesterol 12)  Flexeril 5 Mg Tabs (Cyclobenzaprine hcl) .... One tablet by mouth nightly as needed for cramps 13)  Novolog Flexpen 100 Unit/ml Soln (Insulin aspart) .Marland Kitchen.. 15 units subcutaneously three times a day 14)  Ultram 50 Mg Tabs (Tramadol hcl) .... One tablet by mouth two times a day as needed for pain 15)  Bd Insulin Syringe  Ultrafine 29g X 1/2" 1 Ml Misc (Insulin syringe-needle u-100) .... Use with lantus insulin nightly 16)  Lancets Misc (Lancets) .... Use to check blood sugar three times a day 17)  Reglan 10 Mg Tabs (Metoclopramide hcl) .... One tablet by mouth 30 minutes before meals 18)  Blood Glucose Meter Kit (Blood glucose monitoring suppl) .... Dispense glucometer and testing supplies for four times daily testing 19)  Bystolic 5 Mg Tabs (Nebivolol hcl) .... One tablet by mouth daily  Diabetes Management Assessment/Plan:      The following lipid goals have been established for the patient: Total cholesterol goal of 200; LDL cholesterol goal of 100; HDL cholesterol goal of 40; Triglyceride goal of 150.  Her blood pressure goal is < 130/80.    Hypertension Assessment/Plan:      The patient's hypertensive risk group is category C: Target organ damage and/or diabetes.  Her calculated 10 year risk of coronary heart disease is 17 %.  Today's blood pressure is 164/83.  Her blood pressure goal is < 130/80.  Lipid Assessment/Plan:      Based on NCEP/ATP III, the patient's risk factor category is "history of diabetes".  The patient's lipid goals are as follows: Total cholesterol goal is 200; LDL cholesterol goal is 100; HDL cholesterol goal is 40; Triglyceride goal is 150.  Her LDL cholesterol goal has not been met.    Patient Instructions: 1)  Eye Exam - No appointments available at this time.  contact this office in April to see if referral can be done 2)  Dental - Dental clinic is not accepting referrals at this time for routine problems.  This may change in the future so check back 3)  you do not meet the requirements for a handicap placcard. 4)  Be sure you get refills on all your medications from the pharmacy 5)  Follow up in 3 months for diabetes.   Orders Added: 1)  Est. Patient age 60-64 [18] 2)  UA Dipstick w/o Micro (manual) [81002] 3)  Capillary Blood Glucose/CBG [82948] 4)  T- Hemoglobin A1C  [83036-23375] 5)  T-Lipid Profile [80061-22930] 6)  Rapid HIV  [92370] 7)  T-Syphilis Test (RPR) [86592-23940] 8)  T- GC Chlamydia [16109] 9)  KOH/ WET Mount [87210] 10)  Pap Smear, Thin Prep ( Collection of) [Q0091] 11)  EKG w/ Interpretation [93000] 12)  Hemoccult Cards MCR Screening [G0107] 13)  Vision Screening [99173] 14)  UA Dipstick w/o Micro (manual) [81002] 15)  T-Urine Microalbumin w/creat. ratio [82043-[address]] 16)  Vision Screening [99173]    Laboratory Results   Urine Tests  Date/Time Received: January 30, 2011 2:07 PM   Routine Urinalysis   Color: yellow Appearance: Clear Glucose: negative   (Normal Range: Negative) Bilirubin: negative   (Normal Range: Negative) Ketone: negative   (  Normal Range: Negative) Spec. Gravity: 1.015   (Normal Range: 1.003-1.035) Blood: trace-intact   (Normal Range: Negative) pH: 6.5   (Normal Range: 5.0-8.0) Protein: negative   (Normal Range: Negative) Urobilinogen: 1.0   (Normal Range: 0-1) Nitrite: negative   (Normal Range: Negative) Leukocyte Esterace: negative   (Normal Range: Negative)    Date/Time Received: January 30, 2011 12:28 PM   Other Tests  Rapid HIV: negative Comments: WET prep discarded mistakenly - will need to wait for PAP results    Laboratory Results   Urine Tests    Routine Urinalysis   Color: yellow Appearance: Clear Glucose: negative   (Normal Range: Negative) Bilirubin: negative   (Normal Range: Negative) Ketone: negative   (Normal Range: Negative) Spec. Gravity: 1.015   (Normal Range: 1.003-1.035) Blood: trace-intact   (Normal Range: Negative) pH: 6.5   (Normal Range: 5.0-8.0) Protein: negative   (Normal Range: Negative) Urobilinogen: 1.0   (Normal Range: 0-1) Nitrite: negative   (Normal Range: Negative) Leukocyte Esterace: negative   (Normal Range: Negative)      Other Tests  Rapid HIV: negative Comments: WET prep discarded mistakenly - will need to wait for PAP results

## 2011-02-07 NOTE — Letter (Signed)
Summary: HEALTH SCREEN  HEALTH SCREEN   Imported By: Arta Bruce 02/03/2011 11:41:55  _____________________________________________________________________  External Attachment:    Type:   Image     Comment:   External Document

## 2011-02-16 NOTE — Progress Notes (Signed)
Summary: pt wants rtn call  Phone Note Call from Patient Call back at [phone]   Summary of Call: Patient wants Steward Drone to call her bk again its in reference to what you all talked about yesterday. Initial call taken by: Ayesha Rumpf,  February 03, 2011 8:17 AM  Follow-up for Phone Call        left message on machine for pt to return call to the office. Levon Hedger  February 03, 2011 12:26 PM   Levon Hedger  February 07, 2011 3:27 PM Left message on machine for pt to return call to the office.  spoke with pt her issue has been addressed.

## 2011-04-07 NOTE — Op Note (Signed)
Katherine [name], Katherine [name]                            ACCOUNT NO.:  [id number]   MEDICAL RECORD NO.:  [id number]                   PATIENT TYPE:  INP   LOCATION:  5733                                 FACILITY:  [facility]   PHYSICIAN:  Adolph Pollack, M.D.            DATE OF BIRTH:  [birth year]   DATE OF PROCEDURE:  01/24/2003  DATE OF DISCHARGE:                                 OPERATIVE REPORT   PREOPERATIVE DIAGNOSIS:  Acute cholecystitis.   POSTOPERATIVE DIAGNOSIS:  Acute cholecystitis.   OPERATION PERFORMED:  Laparoscopic cholecystectomy with intraoperative  cholangiogram.   SURGEON:  Adolph Pollack, M.D.   ASSISTANT:  Lebron Conners, M.D.   ANESTHESIA:  General.   INDICATIONS FOR PROCEDURE:  The patient is a 55 year old female who has been  having a week long history of intermittent epigastric and right upper  quadrant pain that has become worse over the past two days.  Clinically, she  appears to have acute cholecystitis with a palpable and tender mass in the  right upper quadrant.  White blood cell count is normal.  Liver functions  normal.  CT scan shows a thickened gallbladder wall with a density in the  gallbladder that looks to be consistent with gallstones.  Now she is brought  to the operating room.   DESCRIPTION OF PROCEDURE:  The patient was brought to the operating room,  placed supine on the operating table and a general anesthetic was  administered.  Her abdomen was sterilely prepped and draped.  Dilute  Marcaine solution was infiltrated in the subumbilical region. A small  subumbilical incision was made incising the skin and subcutaneous tissue  sharply.  Using blunt dissection, the midline was identified and a small  incision made in the midline fascia.  The peritoneal cavity was entered  bluntly and under direct vision.  A pursestring suture was placed around the  fascial edges.  A Hasson trocar was introduced into the peritoneal cavity.  A pneumoperitoneum  was created by insufflation of CO2 gas.   A laparoscope was introduced and a distended, acutely inflamed gallbladder  was noted.  She was placed in a reversed Trendelenburg position and an 11 mm  trocar placed into an epigastric incision with two 5 mm trocars placed  through right midlateral incisions.  The gallbladder was decompressed of  thick, brownish fluid.  The fundus was then grasped and retracted up toward  the right shoulder.  Part of the fundus appeared to be intrahepatic.  The  infundibulum was also localized and there appeared to be an impacted stone  in this area.  It was grasped and using blunt dissection and cautery, I  mobilized the infundibulum.  The common bile duct was identified.  I  identified the cystic duct and created a window around it.  A clip was then  placed just at the cystic duct gallbladder junction and a small incision  made  in the cystic duct.  A cholangiocatheter was passed through the  anterior abdominal wall, placed in the cystic duct and the cholangiogram was  performed.   Using real time fluoroscopy, dilute contrast material was injected into the  cystic duct.  The common bile duct and common hepatic ducts filled promptly  and drained promptly to the duodenum.  We saw the proximal right and left  hepatic ducts as well.  No obvious obstruction was noted.  Final reports  pending the radiologist's interpretation.   The Cholangiocath was then removed, the cystic duct was clipped three times  proximally and divided.  An anterior and posterior branch of the cystic  artery were identified, clipped and divided.  Using electrocautery, the  edematous gallbladder was dissected free from the liver bed.  There was one  what appeared to be accessory duct going straight into the gallbladder and  this was clipped on the staying-in side.  Once the gallbladder was removed,  it was placed in an Endopouch bag.   I then used 2L of fluid to copiously irrigate the  gallbladder fossa.  Bleeding points were controlled with cautery.  I inspected the gallbladder  fossa a number of times and noted no further bleeding.  No bile leakage was  noted.  I evacuated as much of the fluid as possible.   I then removed the gallbladder and the Endopouch bag through the  subumbilical port.  Under laparoscopic vision, the subumbilical fascial  defect was closed by tightening up and tying down the pursestring suture.  The trocars were removed and the pneumoperitoneum was released.   She tolerated the procedure well without any apparent complications and was  taken to the recovery room in satisfactory condition.                                                Adolph Pollack, M.D.    Kari Baars  D:  01/24/2003  T:  01/25/2003  Job:  [id number]   cc:   Duncan Dull, M.D.  [address]  [place]  [place] [address]  Fax: [phone]

## 2011-04-07 NOTE — Discharge Summary (Signed)
   Katherine [name], Katherine [name]                            ACCOUNT NO.:  [id number]   MEDICAL RECORD NO.:  [id number]                   PATIENT TYPE:  INP   LOCATION:  5733                                 FACILITY:  [facility]   PHYSICIAN:  Adolph Pollack, M.D.            DATE OF BIRTH:  Feb 02, 1956   DATE OF ADMISSION:  01/24/2003  DATE OF DISCHARGE:  01/27/2003                                 DISCHARGE SUMMARY   PRIMARY DISCHARGE DIAGNOSIS:  1. Acute and chronic cholecystitis with cholelithiasis.   SECONDARY DIAGNOSES:  1. Hypertension.  2. Type 2 diabetes mellitus.   PROCEDURE:  Laparoscopic cholecystectomy with intraoperative cholangiogram,  01/24/03.   REASON FOR ADMISSION:  Ms. Decker is a 55 year old female who had been  feeling poorly for roughly a week before I had seen her in the emergency  department.  She was seen in the emergency department on 01/22/03 with  abdominal pain and noted to have an abnormal density in the gallbladder and  gallbladder wall thickening.  She presented two days after that on 01/24/03  with worsening pain, nausea and anorexia, fever and chills.  I saw her at  that time and felt that her findings were consistent with acute  cholecystitis and she was admitted.   HOSPITAL COURSE:  She was placed on IV antibiotics.  She underwent the  laparoscopic cholecystectomy with intraoperative cholangiogram.  Postoperatively, she was maintained on intravenous Unasyn.  She slowly  defervesced over a couple of days.  She was slow to be mobilized.  Sliding  scale insulin was used to control her blood sugar and then she was restarted  on Glucophage once her diet was started.  By the third postoperative day,  she was passing a little gas, tolerating a diet, was afebrile and ready for  discharge.   DISPOSITION:  Discharged to home on 01/27/03 in satisfactory condition.  I  asked her to continue to take all her home medications and she was given  Tylox for pain and she was told to  take a laxative of choice if needed.  She  was given activity restrictions and told to be off work for two weeks and  will follow up with me in two weeks in the office.                                                Adolph Pollack, M.D.    Kari Baars  D:  03/05/2003  T:  03/05/2003  Job:  [id number]   cc:   Duncan Dull, M.D.  [address]  [place]  [place] [address]  Fax: [phone]

## 2011-04-07 NOTE — H&P (Signed)
NAMESUSA, BONES                            ACCOUNT NO.:  [id number]   MEDICAL RECORD NO.:  [id number]                   PATIENT TYPE:  INP   LOCATION:  5733                                 FACILITY:  [facility]   PHYSICIAN:  Adolph Pollack, M.D.            DATE OF BIRTH:  [birth year]   DATE OF ADMISSION:  01/24/2003  DATE OF DISCHARGE:                                HISTORY & PHYSICAL   CHIEF COMPLAINT:  Right upper quadrant and right flank pain.   HISTORY OF PRESENT ILLNESS:  Mrs. Ende is a 55 year old female who she  stated began with some epigastric pain off and on last Saturday.  She states  that on Wednesday it moved over to the right upper quadrant region, right  flank area.  She apparently presented to the emergency department a couple  days ago and had a CT scan which demonstrated gallstones and thickening of  the gallbladder wall.  At that time, her white blood cell count was normal  as were her liver function tests.  Glucose was 213 and she is a diabetic.  She was going to see Dr. Cliffton Asters as an outpatient but continued to have  increasing pain and having some fever and chills last night.  She  represented to the emergency department with the same complaints.  I was  subsequently asked to see her because of that.   PAST MEDICAL HISTORY:  1. Hypertension.  2. Type 2 diabetes mellitus.   PAST SURGICAL HISTORY:  None.   ALLERGIES:  SEAWEED AND MRI CONTRAST.   MEDICATIONS:  1. Lisinopril 10 mg daily.  2. Glucophage 1000 mg b.i.d.   SOCIAL HISTORY:  She is married.  No tobacco or alcohol is used.  Denies  drug abuse.   FAMILY HISTORY:  Father has hypertension and diabetes.  Grandmother had  breast cancer.   REVIEW OF SYSTEMS:  CARDIOVASCULAR:  She denies any heart disease or chest  pain.  PULMONARY:  She denies asthma, COPD, pneumonia, tuberculosis.  GI:  No peptic ulcer disease, hepatitis, diverticulitis, melena, hematochezia.  GU:  No kidney stones or urinary  tract infections.  ENDOCRINE:  Diabetes as  mentioned.  HEMATOLOGIC:  No bleeding disorders, transfusions or venous  thrombosis.  NEUROLOGIC:  No strokes or seizures.   PHYSICAL EXAMINATION:  GENERAL:  Mildly obese female appears to be fairly  uncomfortable, lying on her side.  VITAL SIGNS:  Temperature 98.6, blood pressure 150/85, pulse 74,  respirations 18.  HEENT:  Eyes:  Extraocular motions intact, no icterus.  NECK:  Supple without palpable masses or thyromegaly.  CARDIOVASCULAR:  Regular rate and rhythm without murmur.  RESPIRATORY:  Breath sounds equal and clear.  Respirations __________ .  ABDOMEN:  Soft, slightly obese.  There is right upper quadrant tenderness  and guarding present.  Intermittent bowel sounds are noted.  EXTREMITIES:  Good muscular tone, full range of motion, no cyanosis  or  edema.   LABORATORY DATA:  Lipase normal at 14.  CMET normal except for glucose of  141.  White cell count 5800, with no left shift.  Chest x-ray and EKG  pending.   IMPRESSION:  Acute cholecystitis.   PLAN:  1. IV antibiotics.  2. Laparoscopic possible open cholecystectomy.  I did explain the procedure,     rationale and risks to her.  Risks include but are not limited to     bleeding, infection, common bile duct injury, hepatic injury, small     intestinal injury, and risk of general anesthesia.  She seemed to     understand this and is agreeable to proceeding.                                               Adolph Pollack, M.D.    Kari Baars  D:  01/24/2003  T:  01/25/2003  Job:  [id number]

## 2012-03-17 ENCOUNTER — Emergency Department (HOSPITAL_COMMUNITY)
Admission: EM | Admit: 2012-03-17 | Discharge: 2012-03-17 | Disposition: A | Discharge status: Home Health | Payer: Self-pay | Attending: Emergency Medicine | Admitting: Emergency Medicine

## 2012-03-17 ENCOUNTER — Encounter (HOSPITAL_COMMUNITY): Payer: Self-pay | Admitting: Emergency Medicine

## 2012-03-17 DIAGNOSIS — L02211 Cutaneous abscess of abdominal wall: Secondary | ICD-10-CM

## 2012-03-17 DIAGNOSIS — Z794 Long term (current) use of insulin: Secondary | ICD-10-CM | POA: Insufficient documentation

## 2012-03-17 DIAGNOSIS — R739 Hyperglycemia, unspecified: Secondary | ICD-10-CM

## 2012-03-17 DIAGNOSIS — L03319 Cellulitis of trunk, unspecified: Secondary | ICD-10-CM | POA: Insufficient documentation

## 2012-03-17 DIAGNOSIS — E1169 Type 2 diabetes mellitus with other specified complication: Secondary | ICD-10-CM | POA: Insufficient documentation

## 2012-03-17 DIAGNOSIS — L02219 Cutaneous abscess of trunk, unspecified: Secondary | ICD-10-CM | POA: Insufficient documentation

## 2012-03-17 DIAGNOSIS — L03317 Cellulitis of buttock: Secondary | ICD-10-CM | POA: Insufficient documentation

## 2012-03-17 DIAGNOSIS — L0231 Cutaneous abscess of buttock: Secondary | ICD-10-CM | POA: Insufficient documentation

## 2012-03-17 HISTORY — PX: INCISION AND DRAINAGE ABSCESS: SHX5864

## 2012-03-17 HISTORY — DX: Furuncle of buttock: L02.32

## 2012-03-17 HISTORY — DX: Essential (primary) hypertension: I10

## 2012-03-17 LAB — CBC
Hemoglobin: 13.5 g/dL (ref 12.0–15.0)
RBC: 4.71 MIL/uL (ref 3.87–5.11)

## 2012-03-17 LAB — DIFFERENTIAL
Basophils Relative: 0 % (ref 0–1)
Lymphs Abs: 1.8 10*3/uL (ref 0.7–4.0)
Monocytes Relative: 7 % (ref 3–12)
Neutro Abs: 9.2 10*3/uL — ABNORMAL HIGH (ref 1.7–7.7)
Neutrophils Relative %: 77 % (ref 43–77)

## 2012-03-17 LAB — COMPREHENSIVE METABOLIC PANEL
ALT: 9 U/L (ref 0–35)
Albumin: 3.2 g/dL — ABNORMAL LOW (ref 3.5–5.2)
Alkaline Phosphatase: 123 U/L — ABNORMAL HIGH (ref 39–117)
BUN: 11 mg/dL (ref 6–23)
Chloride: 93 mEq/L — ABNORMAL LOW (ref 96–112)
Glucose, Bld: 258 mg/dL — ABNORMAL HIGH (ref 70–99)
Potassium: 3.5 mEq/L (ref 3.5–5.1)
Total Bilirubin: 0.6 mg/dL (ref 0.3–1.2)

## 2012-03-17 MED ORDER — SODIUM CHLORIDE 0.9 % IV BOLUS (SEPSIS)
1000.0000 mL | Freq: Once | INTRAVENOUS | Status: AC
  Administered 2012-03-17: 1000 mL via INTRAVENOUS

## 2012-03-17 MED ORDER — ONDANSETRON HCL 4 MG/2ML IJ SOLN
4.0000 mg | Freq: Once | INTRAMUSCULAR | Status: AC
  Administered 2012-03-17: 4 mg via INTRAVENOUS
  Filled 2012-03-17: qty 2

## 2012-03-17 MED ORDER — CLINDAMYCIN HCL 300 MG PO CAPS: 300.0000 mg | ORAL_CAPSULE | Freq: Four times a day (QID) | ORAL | Status: DC

## 2012-03-17 MED ORDER — CLINDAMYCIN PHOSPHATE 600 MG/50ML IV SOLN
600.0000 mg | Freq: Once | INTRAVENOUS | Status: AC
  Administered 2012-03-17: 600 mg via INTRAVENOUS
  Filled 2012-03-17: qty 50

## 2012-03-17 MED ORDER — HYDROCODONE-ACETAMINOPHEN 5-325 MG PO TABS: 1.0000 | ORAL_TABLET | ORAL | Status: DC | PRN

## 2012-03-17 MED ORDER — HYDROMORPHONE HCL PF 1 MG/ML IJ SOLN
1.0000 mg | Freq: Once | INTRAMUSCULAR | Status: AC
  Administered 2012-03-17: 1 mg via INTRAVENOUS
  Filled 2012-03-17: qty 1

## 2012-03-17 NOTE — ED Provider Notes (Signed)
5:01 PM  Received sign out from Dr Denton Lank.  Pt with abscess of left lower abdomen and separate abscess of buttock.  Pt moved to CDU for I&D of abscesses as well as IVF boluses x 2 for hyperglycemia.    Patient given IV dilaudid prior to procedure that resulted in great relief.    INCISION AND DRAINAGE Performed by: Rise Patience Consent: Verbal consent obtained. Risks and benefits: risks, benefits and alternatives were discussed Type: abscess  Body area: right buttock  Anesthesia: local infiltration  Local anesthetic: lidocaine 2% no epinephrine  Anesthetic total: 6 ml  Complexity: complex Blunt dissection to break up loculations  Drainage: purulent  Drainage amount: small  Packing material: 1/4 in iodoform gauze  Patient tolerance: Patient tolerated the procedure well with no immediate complications.  INCISION AND DRAINAGE Performed by: Rise Patience Consent: Verbal consent obtained. Risks and benefits: risks, benefits and alternatives were discussed Type: abscess  Body area: left lower abdomen  Anesthesia: local infiltration  Local anesthetic: lidocaine 2% no epinephrine  Anesthetic total: 4 ml  Complexity: complex Blunt dissection to break up loculations  Drainage: purulent  Drainage amount: small  Packing material: 1/4 in iodoform gauze  Patient tolerance: Patient tolerated the procedure well with no immediate complications.    6:21 PM Small amount of drainage from both areas despite wide and deep dissection.  Wounds both packed.  Lines drawn around erythema of buttock lesion, two lines indicating two different levels of erythema: inner circle represents deep red and next line contains lighter red/pink area.  Patient is slightly sedated from dilaudid so I spoke with her husband above plans for follow up.    6:29 PM Case manager has called and will look into free medications from the pharmacy for patient.  Given I&D and significant localized cellulitis, I  will prescribe clindamycin.  I discussed I&D with Dr Denton Lank, who then revised I&D.   Wound redressed by nurses.    Patient d/c home with clindamycin, to return in 2 days for recheck.  Return precautions given.  Patient and family member verbalize understanding and agree with plan.      Dillard Cannon [place], [place] 03/17/12 2355

## 2012-03-17 NOTE — ED Provider Notes (Signed)
History     CSN: [id number]  Arrival date & time 03/17/12  1439   First MD Initiated Contact with Patient 03/17/12 1508      Chief Complaint  Patient presents with  . Recurrent Skin Infections    (Consider location/radiation/quality/duration/timing/severity/associated sxs/prior treatment) The history is provided by the patient.  pt c/o nausea for past day. Also noted 2 abscesses, one to left lower abd wall, the other to buttock. Abscess present for 1 week. Slowly worse. Denies hx mrsa/abscesses. Hx iddm, states compliant w rx, says sugars running high. Denies polyuria or polydipsia. No vomiting. Denies abd pain. No gu c/o. No cough or uri c/o. No fever or chills. Pain to areas of abscess, dull, constant, worse w palpation.   Past Medical History  Diagnosis Date  . Diabetes mellitus   . Hypertension   . Boil of buttock     History reviewed. No pertinent past surgical history.  No family history on file.  History  Substance Use Topics  . Smoking status: Never Smoker   . Smokeless tobacco: Not on file  . Alcohol Use: No    OB History    Grav Para Term Preterm Abortions TAB SAB Ect Mult Living                  Review of Systems  Constitutional: Negative for fever and chills.  HENT: Negative for neck pain.   Eyes: Negative for redness.  Respiratory: Negative for shortness of breath.   Cardiovascular: Negative for chest pain.  Gastrointestinal: Positive for nausea. Negative for vomiting and abdominal pain.  Genitourinary: Negative for flank pain.  Musculoskeletal: Negative for back pain.  Skin: Negative for rash.  Neurological: Negative for headaches.  Hematological: Does not bruise/bleed easily.  Psychiatric/Behavioral: Negative for confusion.    Allergies  Review of patient's allergies indicates no known allergies.  Home Medications   Current Outpatient Rx  Name Route Sig Dispense Refill  . ASPIRIN 81 MG PO CHEW Oral Chew 81 mg by mouth daily.    Marland Kitchen  GLIPIZIDE 10 MG PO TABS Oral Take 10 mg by mouth 2 (two) times daily before a meal.    . INSULIN ASPART 100 UNIT/ML [place] SOLN Subcutaneous Inject 10 Units into the skin 2 (two) times daily.    Marland Kitchen LISINOPRIL 40 MG PO TABS Oral Take 40 mg by mouth 2 (two) times daily.      BP 147/59  Pulse 95  Temp(Src) 99.1 F (37.3 C) (Oral)  Resp 20  SpO2 98%  Physical Exam  Nursing note and vitals reviewed. Constitutional: She appears well-developed and well-nourished. No distress.  Eyes: Conjunctivae are normal. No scleral icterus.  Neck: Neck supple. No tracheal deviation present.  Cardiovascular: Normal rate, regular rhythm, normal heart sounds and intact distal pulses.   Pulmonary/Chest: Effort normal and breath sounds normal. No respiratory distress.  Abdominal: Soft. Normal appearance. She exhibits no distension. There is no tenderness.  Musculoskeletal: She exhibits no edema and no tenderness.  Neurological: She is alert.  Skin: Skin is warm and dry. No rash noted.       2 cm diameter abscess to left lower abd wall. 8 cm diameter abscess to buttock.   Psychiatric: She has a normal mood and affect.    ED Course  Procedures (including critical care time)   Labs Reviewed  CBC  DIFFERENTIAL  COMPREHENSIVE METABOLIC PANEL  URINALYSIS, ROUTINE W REFLEX MICROSCOPIC   Results for orders placed during the hospital encounter of 03/17/12  CBC      Component Value Range   WBC 12.0 (*) 4.0 - 10.5 (K/uL)   RBC 4.71  3.87 - 5.11 (MIL/uL)   Hemoglobin 13.5  12.0 - 15.0 (g/dL)   HCT 16.1  09.6 - 04.5 (%)   MCV 84.1  78.0 - 100.0 (fL)   MCH 28.7  26.0 - 34.0 (pg)   MCHC 34.1  30.0 - 36.0 (g/dL)   RDW 40.9  81.1 - 91.4 (%)   Platelets 204  150 - 400 (K/uL)  DIFFERENTIAL      Component Value Range   Neutrophils Relative 77  43 - 77 (%)   Neutro Abs 9.2 (*) 1.7 - 7.7 (K/uL)   Lymphocytes Relative 15  12 - 46 (%)   Lymphs Abs 1.8  0.7 - 4.0 (K/uL)   Monocytes Relative 7  3 - 12 (%)   Monocytes  Absolute 0.9  0.1 - 1.0 (K/uL)   Eosinophils Relative 1  0 - 5 (%)   Eosinophils Absolute 0.1  0.0 - 0.7 (K/uL)   Basophils Relative 0  0 - 1 (%)   Basophils Absolute 0.0  0.0 - 0.1 (K/uL)  COMPREHENSIVE METABOLIC PANEL      Component Value Range   Sodium 130 (*) 135 - 145 (mEq/L)   Potassium 3.5  3.5 - 5.1 (mEq/L)   Chloride 93 (*) 96 - 112 (mEq/L)   CO2 26  19 - 32 (mEq/L)   Glucose, Bld 258 (*) 70 - 99 (mg/dL)   BUN 11  6 - 23 (mg/dL)   Creatinine, Ser 7.82  0.50 - 1.10 (mg/dL)   Calcium 9.0  8.4 - 95.6 (mg/dL)   Total Protein 7.3  6.0 - 8.3 (g/dL)   Albumin 3.2 (*) 3.5 - 5.2 (g/dL)   AST 10  0 - 37 (U/L)   ALT 9  0 - 35 (U/L)   Alkaline Phosphatase 123 (*) 39 - 117 (U/L)   Total Bilirubin 0.6  0.3 - 1.2 (mg/dL)   GFR calc non Af Amer >90  >90 (mL/min)   GFR calc Af Amer >90  >90 (mL/min)       MDM  Iv ns bolus. zofran iv.   Pt moved to cdu for ivf, check labs, and I and D abscesses.  Discussed plan with CDU PA E [place].       Suzi Roots, MD 03/17/12 [phone]

## 2012-03-17 NOTE — ED Notes (Signed)
Discharge instructions reviewed with pt/husband.  Verbalizes understanding.  No questions asked; no further c/o's voiced.  Pt to lobby via wheelchair.  NAD noted.

## 2012-03-17 NOTE — ED Notes (Signed)
Pt presents with c/o boil to left abdomen area x 3 days and buttock area x 2 days. Pt also reports nausea.

## 2012-03-17 NOTE — Discharge Instructions (Signed)
Return to the Emergency Department in 2 days for packing removal and a wound recheck.  Take the antibiotics as directed until they are gone, even if you are feeling better.  Take the prescribed pain medication as needed for pain.  Please do not drive while taking that medication and do not take additional tylenol.  If you need additional pain control or pain control that is not as strong, please use ibuprofen.  If you develop increased redness or swelling, uncontrolled pain, or fevers greater than 100.4, return to the ER immediately.  Monitor your blood sugar closely while you have this infection.  You may return to the ER at any time for worsening condition or any new symptoms that concern you.  Cellulitis Cellulitis is an infection of the tissue under the skin. The infected area is usually red and tender. This is caused by germs. These germs enter the body through cuts or sores. This usually happens in the arms or lower legs. HOME CARE   Take your medicine as told. Finish it even if you start to feel better.   If the infection is on the arm or leg, keep it raised (elevated).   Use a warm cloth on the infected area several times a day.   See your doctor for a follow-up visit as told.  GET HELP RIGHT AWAY IF:   You are tired or confused.   You throw up (vomit).   You have watery poop (diarrhea).   You feel ill and have muscle aches.   You have a fever.  MAKE SURE YOU:   Understand these instructions.   Will watch your condition.   Will get help right away if you are not doing well or get worse.  Document Released: 04/24/2008 Document Revised: 10/26/2011 Document Reviewed: 10/08/2009 [facility] Patient Information 2012 [place], [place].  Abscess An abscess (boil or furuncle) is an infected area under your skin. This area is filled with yellowish white fluid (pus). HOME CARE   Only take medicine as told by your doctor.   Keep the skin clean around your abscess. Keep clothes that  may touch the abscess clean.   Change any bandages (dressings) as told by your doctor.   Avoid direct skin contact with other people. The infection can spread by skin contact with others.   Practice good hygiene and do not share personal care items.   Do not share athletic equipment, towels, or whirlpools. Shower after every practice or work out session.   If a draining area cannot be covered:   Do not play sports.   Children should not go to daycare until the wound has healed or until fluid (drainage) stops coming out of the wound.   See your doctor for a follow-up visit as told.  GET HELP RIGHT AWAY IF:   There is more pain, puffiness (swelling), and redness in the wound site.   There is fluid or bleeding from the wound site.   You have muscle aches, chills, fever, or feel sick.   You or your child has a temperature by mouth above 102 F (38.9 C), not controlled by medicine.   Your baby is older than 3 months with a rectal temperature of 102 F (38.9 C) or higher.  MAKE SURE YOU:   Understand these instructions.   Will watch your condition.   Will get help right away if you are not doing well or get worse.  Document Released: 04/24/2008 Document Revised: 10/26/2011 Document Reviewed: 04/24/2008 [employer] Patient  Information 2012 [place], [place].  Abscess Care After An abscess (also called a boil or furuncle) is an infected area that contains a collection of pus. Signs and symptoms of an abscess include pain, tenderness, redness, or hardness, or you may feel a moveable soft area under your skin. An abscess can occur anywhere in the body. The infection may spread to surrounding tissues causing cellulitis. A cut (incision) by the surgeon was made over your abscess and the pus was drained out. Gauze may have been packed into the space to provide a drain that will allow the cavity to heal from the inside outwards. The boil may be painful for 5 to 7 days. Most people with a boil  do not have high fevers. Your abscess, if seen early, may not have localized, and may not have been lanced. If not, another appointment may be required for this if it does not get better on its own or with medications. HOME CARE INSTRUCTIONS   Only take over-the-counter or prescription medicines for pain, discomfort, or fever as directed by your caregiver.   When you bathe, soak and then remove gauze or iodoform packs at least daily or as directed by your caregiver. You may then wash the wound gently with mild soapy water. Repack with gauze or do as your caregiver directs.  SEEK IMMEDIATE MEDICAL CARE IF:   You develop increased pain, swelling, redness, drainage, or bleeding in the wound site.   You develop signs of generalized infection including muscle aches, chills, fever, or a general ill feeling.   An oral temperature above 102 F (38.9 C) develops, not controlled by medication.  See your caregiver for a recheck if you develop any of the symptoms described above. If medications (antibiotics) were prescribed, take them as directed. Document Released: 05/25/2005 Document Revised: 10/26/2011 Document Reviewed: 01/20/2008 [facility] Patient Information 2012 [place], [place].

## 2012-03-19 ENCOUNTER — Emergency Department (HOSPITAL_COMMUNITY)
Admission: EM | Admit: 2012-03-19 | Discharge: 2012-03-19 | Disposition: A | Discharge status: Home / Self Care | Payer: Self-pay | Attending: Emergency Medicine | Admitting: Emergency Medicine

## 2012-03-19 ENCOUNTER — Encounter (HOSPITAL_COMMUNITY): Payer: Self-pay | Admitting: *Deleted

## 2012-03-19 DIAGNOSIS — E119 Type 2 diabetes mellitus without complications: Secondary | ICD-10-CM | POA: Insufficient documentation

## 2012-03-19 DIAGNOSIS — L0231 Cutaneous abscess of buttock: Secondary | ICD-10-CM | POA: Insufficient documentation

## 2012-03-19 DIAGNOSIS — I10 Essential (primary) hypertension: Secondary | ICD-10-CM | POA: Insufficient documentation

## 2012-03-19 MED ORDER — PROMETHAZINE HCL 25 MG PO TABS: 25.0000 mg | ORAL_TABLET | Freq: Four times a day (QID) | ORAL | Status: DC | PRN

## 2012-03-19 MED ORDER — HYDROCODONE-ACETAMINOPHEN 5-325 MG PO TABS: 1.0000 | ORAL_TABLET | ORAL | Status: DC | PRN

## 2012-03-19 NOTE — ED Provider Notes (Signed)
Medical screening examination/treatment/procedure(s) were conducted as a shared visit with non-physician practitioner(s) and myself.  I personally evaluated the patient during the encounter See H and P.   Suzi Roots, MD 03/19/12 1348

## 2012-03-19 NOTE — Discharge Instructions (Signed)
Take vicodin as prescribed for severe pain.   Do not drive within four hours of taking this medication (may cause drowsiness or confusion).  Continue your clindamycin.  Please return to the ED in 2 days for recheck of abscess on your buttock.  You may return sooner if you have any other concerns.

## 2012-03-19 NOTE — ED Notes (Signed)
MD at bedside. 

## 2012-03-19 NOTE — ED Notes (Signed)
Pt is here with recheck for upper buttocks and abdominal abscesses that were lanced and reports some drainage

## 2012-03-19 NOTE — ED Provider Notes (Signed)
History     CSN: [id number]  Arrival date & time 03/19/12  4098   First MD Initiated Contact with Patient 03/19/12 [phone]      Chief Complaint  Patient presents with  . Abscess    stomach and buttocks previous lance and here for recheck    (Consider location/radiation/quality/duration/timing/severity/associated sxs/prior treatment) HPI History provided by pt and prior chart.  Per prior chart, pt had I&D of abscess on right buttock and left lower abd wall 2 days ago and d/c'd home w/ clindamycin.  Pt reports that she has been compliant w/ abx.  The pain in her buttock seems to have worsened today but she has not taken any of her vicodin and her pain yesterday was unbearable as well.  Aggravated by applying pressure.  Has not removed bandages to check for drainage.  No known fever.   Past Medical History  Diagnosis Date  . Diabetes mellitus   . Hypertension   . Boil of buttock     History reviewed. No pertinent past surgical history.  No family history on file.  History  Substance Use Topics  . Smoking status: Never Smoker   . Smokeless tobacco: Not on file  . Alcohol Use: No    OB History    Grav Para Term Preterm Abortions TAB SAB Ect Mult Living                  Review of Systems  All other systems reviewed and are negative.    Allergies  Review of patient's allergies indicates no known allergies.  Home Medications   Current Outpatient Rx  Name Route Sig Dispense Refill  . ASPIRIN 81 MG PO CHEW Oral Chew 81 mg by mouth daily.    Marland Kitchen CLINDAMYCIN HCL 300 MG PO CAPS Oral Take 300 mg by mouth 4 (four) times daily.    Marland Kitchen GLIPIZIDE 10 MG PO TABS Oral Take 10 mg by mouth 2 (two) times daily before a meal.    . HYDROCODONE-ACETAMINOPHEN 5-325 MG PO TABS Oral Take 1-2 tablets by mouth every 4 (four) hours as needed.    . INSULIN ASPART 100 UNIT/ML [place] SOLN Subcutaneous Inject 10 Units into the skin 2 (two) times daily.    Marland Kitchen LISINOPRIL 40 MG PO TABS Oral Take 40 mg by  mouth 2 (two) times daily.      BP 160/74  Pulse 85  Temp(Src) 98.1 F (36.7 C) (Oral)  Resp 16  SpO2 97%  Physical Exam  Nursing note and vitals reviewed. Constitutional: She is oriented to person, place, and time. She appears well-developed and well-nourished. No distress.  HENT:  Head: Normocephalic and atraumatic.  Eyes:       Normal appearance  Neck: Normal range of motion.  Pulmonary/Chest: Effort normal.  Musculoskeletal: Normal range of motion.  Neurological: She is alert and oriented to person, place, and time.  Skin:       1.5cm open wound LLQ w/ minimal surrounding erythema.  Packing in place.  No purulent drainage on dressing.  Minimally tender.  ~4cm open wound medial right buttock w/ packing in place.  Sm amt of purulent drainage on dressing.  Surrounding erythema has not spread beyond drawn margins but little improvement.  Induration that measures 19cm in length and 11 in width on medial buttock.  Entire right buttock ttp.    Psychiatric: She has a normal mood and affect. Her behavior is normal.    ED Course  Procedures (including critical care time)  Labs Reviewed - No data to display No results found.   1. Abscess of right buttock       MDM  Pt seen 2 days ago for abscess of left lower abd and right medial buttock.  Returns today for recheck.  Has been compliant with abx. Reports that pain in buttock is getting worse, though didn't take any pain medication today and pain was "unbearable" yesterday.  Erythema maintained w/in drawn margins but does not seem to have improved.  Wound on left abd w/ minimal surrounding erythema and is non-tender.  Margins of erythema drawn today.  No drainage from either wound.  Packing removed from both and replaced in right buttock.  Pt  Advised to continue abx and return again in 2 days for recheck.  Prescribed 10 more vicodin for pain.          Otilio Miu, [place] 03/19/12 1825

## 2012-03-20 NOTE — ED Provider Notes (Signed)
Medical screening examination/treatment/procedure(s) were performed by non-physician practitioner and as supervising physician I was immediately available for consultation/collaboration.   Gwyneth Sprout, MD 03/20/12 [phone]

## 2012-03-21 ENCOUNTER — Encounter (HOSPITAL_COMMUNITY): Payer: Self-pay

## 2012-03-21 ENCOUNTER — Emergency Department (HOSPITAL_COMMUNITY)
Admission: EM | Admit: 2012-03-21 | Discharge: 2012-03-21 | Disposition: A | Discharge status: Home / Self Care | Payer: Self-pay | Attending: Emergency Medicine | Admitting: Emergency Medicine

## 2012-03-21 DIAGNOSIS — I10 Essential (primary) hypertension: Secondary | ICD-10-CM | POA: Insufficient documentation

## 2012-03-21 DIAGNOSIS — L0231 Cutaneous abscess of buttock: Secondary | ICD-10-CM

## 2012-03-21 DIAGNOSIS — L0233 Carbuncle of buttock: Secondary | ICD-10-CM | POA: Insufficient documentation

## 2012-03-21 DIAGNOSIS — E119 Type 2 diabetes mellitus without complications: Secondary | ICD-10-CM | POA: Insufficient documentation

## 2012-03-21 DIAGNOSIS — Z7982 Long term (current) use of aspirin: Secondary | ICD-10-CM | POA: Insufficient documentation

## 2012-03-21 DIAGNOSIS — Z794 Long term (current) use of insulin: Secondary | ICD-10-CM | POA: Insufficient documentation

## 2012-03-21 NOTE — Discharge Instructions (Signed)
Abscess An abscess (boil or furuncle) is an infected area under your skin. This area is filled with yellowish white fluid (pus). HOME CARE   Only take medicine as told by your doctor.   Keep the skin clean around your abscess. Keep clothes that may touch the abscess clean.   Change any bandages (dressings) as told by your doctor.   Avoid direct skin contact with other people. The infection can spread by skin contact with others.   Practice good hygiene and do not share personal care items.   Do not share athletic equipment, towels, or whirlpools. Shower after every practice or work out session.   If a draining area cannot be covered:   Do not play sports.   Children should not go to daycare until the wound has healed or until fluid (drainage) stops coming out of the wound.   See your doctor for a follow-up visit as told.  GET HELP RIGHT AWAY IF:   There is more pain, puffiness (swelling), and redness in the wound site.   There is fluid or bleeding from the wound site.   You have muscle aches, chills, fever, or feel sick.   You or your child has a temperature by mouth above 102 F (38.9 C), not controlled by medicine.   Your baby is older than 3 months with a rectal temperature of 102 F (38.9 C) or higher.  MAKE SURE YOU:   Understand these instructions.   Will watch your condition.   Will get help right away if you are not doing well or get worse.  Document Released: 04/24/2008 Document Revised: 10/26/2011 Document Reviewed: 04/24/2008 [facility] Patient Information 2012 [place], [place].  Soak the area multiple times per day. Make sure it is completely dry before applying dressing. Take all antibiotics. Return for recheck of any worsening of symptoms especially increased pain, swelling, or redness or discharge.

## 2012-03-21 NOTE — ED Notes (Signed)
Pt presents for re-evaluation of abscess to bilateral buttocks.  Packing remains to both sites, last seen here x 2 days ago to have areas repacked.  Pt compliant with abx.

## 2012-03-21 NOTE — ED Provider Notes (Signed)
History  Scribed for Hilario Quarry, MD, the patient was seen in room STRE6/STRE6. This chart was scribed by Candelaria Stagers. The patient's care started at 3:17 PM    CSN: [id number]  Arrival date & time 03/21/12  1438   None     Chief Complaint  Patient presents with  . Abscess    HPI Katherine [name] is a 56 y.o. female who presents to the Emergency Department for a recheck of boils on her buttocks that were opened up five days ago.  She has h/o diabetes, HTN.  She states that there are sharp pains going through the abscess and that the abscess has gone down in size.  She is taking an antibiotic.  She is experiencing a fever.  Pt is a patient at [facility].   Past Medical History  Diagnosis Date  . Diabetes mellitus   . Hypertension   . Boil of buttock     History reviewed. No pertinent past surgical history.  No family history on file.  History  Substance Use Topics  . Smoking status: Never Smoker   . Smokeless tobacco: Not on file  . Alcohol Use: No    OB History    Grav Para Term Preterm Abortions TAB SAB Ect Mult Living                  Review of Systems  Constitutional: Positive for fever.  Musculoskeletal:       One boil that has been opened and packed.   All other systems reviewed and are negative.    Allergies  Review of patient's allergies indicates no known allergies.  Home Medications   Current Outpatient Rx  Name Route Sig Dispense Refill  . ASPIRIN 81 MG PO CHEW Oral Chew 81 mg by mouth daily.    Marland Kitchen CLINDAMYCIN HCL 300 MG PO CAPS Oral Take 300 mg by mouth 4 (four) times daily. For 10 days    . GLIPIZIDE 10 MG PO TABS Oral Take 10 mg by mouth 2 (two) times daily before a meal.    . HYDROCODONE-ACETAMINOPHEN 5-325 MG PO TABS Oral Take 1-2 tablets by mouth every 4 (four) hours as needed. For pain    . INSULIN ASPART 100 UNIT/ML [place] SOLN Subcutaneous Inject 10 Units into the skin 2 (two) times daily before a meal.     . LISINOPRIL 40 MG PO TABS  Oral Take 40 mg by mouth 2 (two) times daily.    Marland Kitchen PROMETHAZINE HCL 25 MG PO TABS Oral Take 25 mg by mouth every 6 (six) hours as needed. For nausea      BP 186/57  Pulse 84  Temp(Src) 98.1 F (36.7 C) (Oral)  Resp 16  Ht 6' (1.829 m)  Wt 204 lb (92.534 kg)  BMI 27.67 kg/m2  SpO2 97%  Physical Exam  Nursing note and vitals reviewed. Constitutional: She is oriented to person, place, and time. She appears well-developed and well-nourished.  HENT:  Head: Normocephalic and atraumatic.  Musculoskeletal: Normal range of motion. She exhibits no tenderness.       Boil on buttocks that has been opened and packed.  Reduced in size.  Neurological: She is alert and oriented to person, place, and time.  Psychiatric: She has a normal mood and affect. Her behavior is normal.    ED Course  Procedures   DIAGNOSTIC STUDIES: Oxygen Saturation is 97% on room air, normal by my interpretation.    COORDINATION OF CARE:  3:18 PM  Removed packing from boils.  Advised patient to soak the wounds.    Labs Reviewed - No data to display No results found.   No diagnosis found.  I personally performed the services described in this documentation, which was scribed in my presence. The recorded information has been reviewed and considered.   MDM  Erythema and pain appears to have decreased. There is a previous lying on the skin which does not currently have erythema to it. Packing was removed from the abscess. There is no purulent drainage noted at this time. The patient is advised to continue her clindamycin. She is advised to begin soaks and keep the area clean and dry. She'll return if there is any worsening of her symptoms. 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 She states they're unable to get her in until May. She is advised to return here if she is worsening      Hilario Quarry, MD 03/21/12 [phone]

## 2012-12-06 ENCOUNTER — Encounter (HOSPITAL_COMMUNITY): Payer: Self-pay | Admitting: Family Medicine

## 2012-12-06 ENCOUNTER — Emergency Department (HOSPITAL_COMMUNITY): Payer: Self-pay

## 2012-12-06 ENCOUNTER — Emergency Department (HOSPITAL_COMMUNITY)
Admission: EM | Admit: 2012-12-06 | Discharge: 2012-12-07 | Disposition: A | Discharge status: Home / Self Care | Payer: Self-pay | Attending: Emergency Medicine | Admitting: Emergency Medicine

## 2012-12-06 DIAGNOSIS — R5381 Other malaise: Secondary | ICD-10-CM | POA: Insufficient documentation

## 2012-12-06 DIAGNOSIS — Z7982 Long term (current) use of aspirin: Secondary | ICD-10-CM | POA: Insufficient documentation

## 2012-12-06 DIAGNOSIS — E860 Dehydration: Secondary | ICD-10-CM | POA: Insufficient documentation

## 2012-12-06 DIAGNOSIS — R5383 Other fatigue: Secondary | ICD-10-CM | POA: Insufficient documentation

## 2012-12-06 DIAGNOSIS — I10 Essential (primary) hypertension: Secondary | ICD-10-CM | POA: Insufficient documentation

## 2012-12-06 DIAGNOSIS — Z872 Personal history of diseases of the skin and subcutaneous tissue: Secondary | ICD-10-CM | POA: Insufficient documentation

## 2012-12-06 DIAGNOSIS — Z79899 Other long term (current) drug therapy: Secondary | ICD-10-CM | POA: Insufficient documentation

## 2012-12-06 DIAGNOSIS — R739 Hyperglycemia, unspecified: Secondary | ICD-10-CM

## 2012-12-06 DIAGNOSIS — E1169 Type 2 diabetes mellitus with other specified complication: Secondary | ICD-10-CM | POA: Insufficient documentation

## 2012-12-06 LAB — CBC WITH DIFFERENTIAL/PLATELET
Basophils Absolute: 0 10*3/uL (ref 0.0–0.1)
Lymphocytes Relative: 17 % (ref 12–46)
Lymphs Abs: 1 10*3/uL (ref 0.7–4.0)
MCV: 83.3 fL (ref 78.0–100.0)
Neutro Abs: 3.9 10*3/uL (ref 1.7–7.7)
Platelets: 197 10*3/uL (ref 150–400)
RBC: 5.15 MIL/uL — ABNORMAL HIGH (ref 3.87–5.11)
RDW: 12.5 % (ref 11.5–15.5)
WBC: 5.7 10*3/uL (ref 4.0–10.5)

## 2012-12-06 LAB — APTT: aPTT: 32 seconds (ref 24–37)

## 2012-12-06 LAB — POCT I-STAT, CHEM 8
BUN: 9 mg/dL (ref 6–23)
Calcium, Ion: 1.14 mmol/L (ref 1.12–1.23)
HCT: 46 % (ref 36.0–46.0)
Hemoglobin: 15.6 g/dL — ABNORMAL HIGH (ref 12.0–15.0)
Sodium: 138 mEq/L (ref 135–145)
TCO2: 26 mmol/L (ref 0–100)

## 2012-12-06 LAB — PROTIME-INR
INR: 1.04 (ref 0.00–1.49)
Prothrombin Time: 13.5 seconds (ref 11.6–15.2)

## 2012-12-06 LAB — GLUCOSE, CAPILLARY: Glucose-Capillary: 376 mg/dL — ABNORMAL HIGH (ref 70–99)

## 2012-12-06 MED ORDER — SODIUM CHLORIDE 0.9 % IV BOLUS (SEPSIS)
1000.0000 mL | Freq: Once | INTRAVENOUS | Status: AC
  Administered 2012-12-06: 1000 mL via INTRAVENOUS

## 2012-12-06 MED ORDER — SODIUM CHLORIDE 0.9 % IV BOLUS (SEPSIS)
1000.0000 mL | INTRAVENOUS | Status: AC
  Administered 2012-12-06: 1000 mL via INTRAVENOUS

## 2012-12-06 MED ORDER — SODIUM CHLORIDE 0.9 % IV SOLN
Freq: Once | INTRAVENOUS | Status: AC
  Administered 2012-12-07: 01:00:00 via INTRAVENOUS

## 2012-12-06 MED ORDER — ONDANSETRON HCL 4 MG/2ML IJ SOLN
4.0000 mg | Freq: Once | INTRAMUSCULAR | Status: AC
  Administered 2012-12-06: 4 mg via INTRAVENOUS
  Filled 2012-12-06: qty 2

## 2012-12-06 NOTE — ED Provider Notes (Signed)
History     CSN: [id number]  Arrival date & time 12/06/12  2207   First MD Initiated Contact with Patient 12/06/12 2253      Chief Complaint  Patient presents with  . Chest Pain  . Weakness    (Consider location/radiation/quality/duration/timing/severity/associated sxs/prior treatment) HPI Comments: 57 y/o female with hx of DM and htn who presents with near syncope - she states that she has been fasting for 3 days for religious observance at church and while at church services this everning she became very light headed, nauseated and diaphoretic followed by a near syncopal episode - friends standing by said that they couldn't get her to respond though her eyes were opened.  Currently she complains of having mild CP, nausea and generalized weakness.  CBG h as been in the 200's the last few days.  Sx were acute in onset, gradually improving, not associated with vomiting but did have an episode of diarrhea.  Sx were severe and are currently mild.  Patient is a 57 y.o. female presenting with chest pain and weakness. The history is provided by the patient, the spouse and medical records.  Chest Pain  Associated symptoms include weakness.    Weakness  Additional symptoms include weakness.    Past Medical History  Diagnosis Date  . Diabetes mellitus   . Hypertension   . Boil of buttock     History reviewed. No pertinent past surgical history.  No family history on file.  History  Substance Use Topics  . Smoking status: Never Smoker   . Smokeless tobacco: Not on file  . Alcohol Use: No    OB History    Grav Para Term Preterm Abortions TAB SAB Ect Mult Living                  Review of Systems  Cardiovascular: Positive for chest pain.  Neurological: Positive for weakness.  All other systems reviewed and are negative.    Allergies  Other  Home Medications   Current Outpatient Rx  Name  Route  Sig  Dispense  Refill  . ASPIRIN EC 81 MG PO TBEC   Oral   Take  81 mg by mouth daily.         Marland Kitchen LISINOPRIL 40 MG PO TABS   Oral   Take 40 mg by mouth 2 (two) times daily.         . ADULT MULTIVITAMIN W/MINERALS CH   Oral   Take 1 tablet by mouth daily.         Marland Kitchen SITAGLIPTIN PHOSPHATE 100 MG PO TABS   Oral   Take 100 mg by mouth daily.         Marland Kitchen ONDANSETRON 4 MG PO TBDP   Oral   Take 1 tablet (4 mg total) by mouth every 8 (eight) hours as needed for nausea.   10 tablet   0   . PROMETHAZINE HCL 25 MG PO TABS   Oral   Take 25 mg by mouth every 6 (six) hours as needed. For nausea           BP 157/75  Pulse 84  Temp 98.1 F (36.7 C) (Oral)  Resp 20  SpO2 98%  Physical Exam  Nursing note and vitals reviewed. Constitutional: She appears well-developed and well-nourished. No distress.  HENT:  Head: Normocephalic and atraumatic.  Mouth/Throat: No oropharyngeal exudate.       Mucous membranes are dry  Eyes: Conjunctivae normal and EOM are  normal. Pupils are equal, round, and reactive to light. Right eye exhibits no discharge. Left eye exhibits no discharge. No scleral icterus.  Neck: Normal range of motion. Neck supple. No JVD present. No thyromegaly present.  Cardiovascular: Normal rate, regular rhythm, normal heart sounds and intact distal pulses.  Exam reveals no gallop and no friction rub.   No murmur heard. Pulmonary/Chest: Effort normal and breath sounds normal. No respiratory distress. She has no wheezes. She has no rales.  Abdominal: Soft. Bowel sounds are normal. She exhibits no distension and no mass. There is tenderness.       Suprapubic tenderness, no guarding, no masses, no pain at McBurney's point  Musculoskeletal: Normal range of motion. She exhibits no edema and no tenderness.  Lymphadenopathy:    She has no cervical adenopathy.  Neurological: She is alert. Coordination normal.  Skin: Skin is warm and dry. No rash noted. No erythema.  Psychiatric: She has a normal mood and affect. Her behavior is normal.     ED Course  Procedures (including critical care time)  Labs Reviewed  CBC WITH DIFFERENTIAL - Abnormal; Notable for the following:    RBC 5.15 (*)     All other components within normal limits  GLUCOSE, CAPILLARY - Abnormal; Notable for the following:    Glucose-Capillary 376 (*)     All other components within normal limits  POCT I-STAT, CHEM 8 - Abnormal; Notable for the following:    Glucose, Bld 392 (*)     Hemoglobin 15.6 (*)     All other components within normal limits  URINALYSIS, ROUTINE W REFLEX MICROSCOPIC - Abnormal; Notable for the following:    Glucose, UA >1000 (*)     Hgb urine dipstick SMALL (*)     All other components within normal limits  URINE MICROSCOPIC-ADD ON - Abnormal; Notable for the following:    Bacteria, UA FEW (*)     All other components within normal limits  GLUCOSE, CAPILLARY - Abnormal; Notable for the following:    Glucose-Capillary 292 (*)     All other components within normal limits  GLUCOSE, CAPILLARY - Abnormal; Notable for the following:    Glucose-Capillary 340 (*)     All other components within normal limits  APTT  PROTIME-INR  POCT I-STAT TROPONIN I   Dg Chest Port 1 View  12/06/2012  *RADIOLOGY REPORT*  Clinical Data: 57 year old female with chest pain.  PORTABLE CHEST - 1 VIEW  Comparison: 11/04/2007 and prior chest radiographs  Findings: The cardiomediastinal silhouette is unremarkable. The lungs are clear. There is no evidence of focal airspace disease, pulmonary edema, suspicious pulmonary nodule/mass, pleural effusion, or pneumothorax. No acute bony abnormalities are identified.  IMPRESSION: No evidence of active cardiopulmonary disease.   Original Report Authenticated By: Harmon Pier, M.D.      1. Hyperglycemia   2. Dehydration       MDM  The patient does appear generally weak and she has some difficulty sitting on the bed however she follows commands without difficulty and has no focal extremity weakness, no slurred  speech, no changes in vision. Her EKG shows ectopy but no signs of ischemia and I suspect that the majority of her symptoms are related to dehydration and poor calorie intake over the last several days. We'll pursue workup for near-syncope  ED ECG REPORT  I personally interpreted this EKG   Date: 12/07/2012   Rate: 82  Rhythm: normal sinus rhythm  QRS Axis: normal  Intervals: normal  ST/T Wave abnormalities: normal  Conduction Disutrbances:none  Narrative Interpretation: PVC present  Old EKG Reviewed: Compared with 2004, ectopy no present, no significant changes otherwise   IV fluids given, insulin given, patient has improved and has tolerated oral food and fluids and appears well. Labwork and showed a normal blood count, clean urinalysis and basic metabolic panel that showed no acidosis and no anion gap. Blood sugar improved, chest x-ray without acute findings, patient stable for discharge      Vida Roller, MD 12/07/12 [phone]

## 2012-12-06 NOTE — ED Notes (Signed)
Per EMS, pt has been fasting -not eating or taking medications and only "sipping on fluids"- since Tuesday Per EMS, pt was at a church service tonight and became hot and diaphoretic, and had one episode of diarrhea. Pt has hx of diabetes, pt denies cardiac history.  EMS started of NS PTA

## 2012-12-07 LAB — GLUCOSE, CAPILLARY
Glucose-Capillary: 292 mg/dL — ABNORMAL HIGH (ref 70–99)
Glucose-Capillary: 340 mg/dL — ABNORMAL HIGH (ref 70–99)

## 2012-12-07 LAB — URINALYSIS, ROUTINE W REFLEX MICROSCOPIC
Glucose, UA: >1000 mg/dL — AB
Ketones, ur: NEGATIVE mg/dL
Leukocytes, UA: NEGATIVE
Protein, ur: NEGATIVE mg/dL
Urobilinogen, UA: 1 mg/dL (ref 0.0–1.0)

## 2012-12-07 LAB — URINE MICROSCOPIC-ADD ON

## 2012-12-07 MED ORDER — ONDANSETRON 4 MG PO TBDP: 4.0000 mg | ORAL_TABLET | Freq: Three times a day (TID) | ORAL | Status: DC | PRN

## 2012-12-07 MED ORDER — INSULIN ASPART 100 UNIT/ML ~~LOC~~ SOLN
5.0000 [IU] | Freq: Once | SUBCUTANEOUS | Status: AC
  Administered 2012-12-07: 5 [IU] via SUBCUTANEOUS
  Filled 2012-12-07: qty 1

## 2012-12-07 NOTE — ED Notes (Signed)
PT comfortable with discharge instructions. 

## 2012-12-07 NOTE — ED Notes (Signed)
Pt states waiting in room for ride and change of clothing, Charge nurse aware of situation.

## 2012-12-07 NOTE — ED Notes (Signed)
CBG 292, EDP aware

## 2012-12-23 ENCOUNTER — Encounter (HOSPITAL_COMMUNITY): Payer: Self-pay

## 2012-12-23 ENCOUNTER — Emergency Department (HOSPITAL_COMMUNITY)
Admission: EM | Admit: 2012-12-23 | Discharge: 2012-12-23 | Disposition: A | Discharge status: Home / Self Care | Payer: No Typology Code available for payment source | Source: Home / Self Care | Attending: Family Medicine | Admitting: Family Medicine

## 2012-12-23 DIAGNOSIS — E7800 Pure hypercholesterolemia, unspecified: Secondary | ICD-10-CM

## 2012-12-23 DIAGNOSIS — K029 Dental caries, unspecified: Secondary | ICD-10-CM

## 2012-12-23 DIAGNOSIS — K219 Gastro-esophageal reflux disease without esophagitis: Secondary | ICD-10-CM

## 2012-12-23 DIAGNOSIS — IMO0001 Reserved for inherently not codable concepts without codable children: Secondary | ICD-10-CM

## 2012-12-23 DIAGNOSIS — F3289 Other specified depressive episodes: Secondary | ICD-10-CM

## 2012-12-23 DIAGNOSIS — F329 Major depressive disorder, single episode, unspecified: Secondary | ICD-10-CM

## 2012-12-23 LAB — CBC
Hemoglobin: 14.1 g/dL (ref 12.0–15.0)
MCH: 29 pg (ref 26.0–34.0)
MCV: 83.3 fL (ref 78.0–100.0)
Platelets: 188 10*3/uL (ref 150–400)
RBC: 4.86 MIL/uL (ref 3.87–5.11)
WBC: 5.1 10*3/uL (ref 4.0–10.5)

## 2012-12-23 LAB — COMPREHENSIVE METABOLIC PANEL
ALT: 23 U/L (ref 0–35)
Alkaline Phosphatase: 131 U/L — ABNORMAL HIGH (ref 39–117)
BUN: 11 mg/dL (ref 6–23)
Chloride: 97 mEq/L (ref 96–112)
GFR calc Af Amer: >90 mL/min (ref >90)
Glucose, Bld: 462 mg/dL — ABNORMAL HIGH (ref 70–99)
Potassium: 3.8 mEq/L (ref 3.5–5.1)
Sodium: 134 mEq/L — ABNORMAL LOW (ref 135–145)
Total Bilirubin: 0.4 mg/dL (ref 0.3–1.2)

## 2012-12-23 LAB — LIPID PANEL
Cholesterol: 198 mg/dL (ref 0–200)
VLDL: 26 mg/dL (ref 0–40)

## 2012-12-23 LAB — GLUCOSE, CAPILLARY: Glucose-Capillary: 440 mg/dL — ABNORMAL HIGH (ref 70–99)

## 2012-12-23 LAB — TSH: TSH: 0.966 u[IU]/mL (ref 0.350–4.500)

## 2012-12-23 MED ORDER — INSULIN ASPART 100 UNIT/ML ~~LOC~~ SOLN
10.0000 [IU] | Freq: Once | SUBCUTANEOUS | Status: AC
  Administered 2012-12-23: 10 [IU] via SUBCUTANEOUS

## 2012-12-23 MED ORDER — INSULIN DETEMIR 100 UNIT/ML ~~LOC~~ SOLN: SUBCUTANEOUS | Status: DC

## 2012-12-23 MED ORDER — INSULIN PEN NEEDLE 31G X 8 MM MISC: 1.0000 [IU] | Status: DC

## 2012-12-23 MED ORDER — HYDROCHLOROTHIAZIDE 12.5 MG PO TABS: 12.5000 mg | ORAL_TABLET | Freq: Every day | ORAL | Status: DC

## 2012-12-23 MED ORDER — METFORMIN HCL ER 500 MG PO TB24: ORAL_TABLET | ORAL | Status: DC

## 2012-12-23 MED ORDER — LORATADINE 10 MG PO TABS: 10.0000 mg | ORAL_TABLET | Freq: Every day | ORAL | Status: DC

## 2012-12-23 MED ORDER — TRAMADOL HCL 50 MG PO TABS: 50.0000 mg | ORAL_TABLET | Freq: Two times a day (BID) | ORAL | Status: DC | PRN

## 2012-12-23 MED ORDER — LISINOPRIL 40 MG PO TABS: 40.0000 mg | ORAL_TABLET | Freq: Every day | ORAL | Status: DC

## 2012-12-23 NOTE — ED Provider Notes (Signed)
History    CSN: [id number]  Arrival date & time 12/23/12  1009   First MD Initiated Contact with Patient 12/23/12 1023     Chief Complaint  Patient presents with  . Medication Refill    HPI The patient presented today as a new patient to the clinic.  She has poorly controlled diabetes mellitus.  She has previously been on insulin.  She reports that she was taken off insulin at some point recently and started on oral Januvia 100 mg daily.  Her glycemic control has deteriorated since that time.  She also reports that she had been on metformin in the past but was taken off because she was started on insulin.  She reports that she had taken Lantus 70 units daily in the past.  She reports that she is taking NovoLog insulin 25 units in the evening.  She reports that she stopped doing that because she was having hypoglycemic episodes.  She reports that now she takes glipizide 10 mg twice a day with Januvia 100 mg daily.  She reports that her blood sugars have been in the 200-300 range when she checks it on most occasions.  She reports that she has not seen her blood sugar down in the range of less than 200 in very long time.  The patient reports that she does check her blood glucose but she needs some assistance with lancets at this time.   Past Medical History  Diagnosis Date  . Diabetes mellitus   . Hypertension   . Boil of buttock     History reviewed. No pertinent past surgical history.  No family history on file.  History  Substance Use Topics  . Smoking status: Never Smoker   . Smokeless tobacco: Not on file  . Alcohol Use: No    OB History    Grav Para Term Preterm Abortions TAB SAB Ect Mult Living                 Review of Systems  Constitutional: Positive for fatigue and unexpected weight change.  Eyes: Positive for visual disturbance.  Genitourinary: Positive for urgency and frequency.       Nocturia  Musculoskeletal: Positive for arthralgias.  Neurological: Positive for  weakness.  All other systems reviewed and are negative.    Allergies  Other  Home Medications   Current Outpatient Rx  Name  Route  Sig  Dispense  Refill  . ASPIRIN EC 81 MG PO TBEC   Oral   Take 81 mg by mouth daily.         . INSULIN DETEMIR 100 UNIT/ML [place] SOLN      Take 25 units daily at bedtime, no later than 10 pm, or as directed by physician   10 mL   5   . INSULIN PEN NEEDLE 31G X 8 MM MISC   Does not apply   1 Units by Does not apply route as directed.   100 each   3   . LISINOPRIL 40 MG PO TABS   Oral   Take 1 tablet (40 mg total) by mouth daily.   30 tablet   3   . LORATADINE 10 MG PO TABS   Oral   Take 1 tablet (10 mg total) by mouth daily.   30 tablet   3   . METFORMIN HCL ER 500 MG PO TB24      Take 1 po with supper for 1 week, then take 1 po bidAC for  2 weeks, then take 2 po BIDAC.   120 tablet   3   . ADULT MULTIVITAMIN W/MINERALS CH   Oral   Take 1 tablet by mouth daily.         . TRAMADOL HCL 50 MG PO TABS   Oral   Take 1 tablet (50 mg total) by mouth every 12 (twelve) hours as needed for pain.   30 tablet   0     BP 187/67  Pulse 80  Temp 98.4 F (36.9 C) (Oral)  Resp 16  SpO2 100%  Physical Exam  Nursing note and vitals reviewed. Constitutional: She is oriented to person, place, and time. She appears well-developed and well-nourished. No distress.  HENT:  Head: Normocephalic and atraumatic.  Eyes: Conjunctivae normal and EOM are normal. Pupils are equal, round, and reactive to light.  Neck: Normal range of motion. Neck supple.  Cardiovascular: Normal rate, regular rhythm and normal heart sounds.   Pulmonary/Chest: Effort normal and breath sounds normal.  Abdominal: Soft. Bowel sounds are normal. She exhibits no distension. There is no tenderness.  Musculoskeletal: Normal range of motion. She exhibits no edema and no tenderness.  Neurological: She is alert and oriented to person, place, and time.  Skin: Skin is warm  and dry.  Psychiatric: She has a normal mood and affect. Her behavior is normal. Judgment and thought content normal.    ED Course  Procedures (including critical care time)   Labs Reviewed  COMPREHENSIVE METABOLIC PANEL  LIPID PANEL  CBC  HEMOGLOBIN A1C  TSH   BS-460   1. DEPRESSION   2. HYPERCHOLESTEROLEMIA   3. GERD   4. DENTAL CARIES   5. DIABETES MELLITUS, TYPE II, UNCONTROLLED   6.  Glucose toxicity 7.  Suboptimally controlled hypertension  MDM  DC Januvia DC glipizide Start levemir 25 units QHS with the understanding that we likely will need to increase the dose Start metformin 500 mg po daily titrating over 3 weeks to 2 po BIDAC Check BS 3-4 times per day Check labs today Pt declined flu vaccine today Added HCTZ 12.5 mg po daily to the blood pressure regimen Patient was given 10 units of NovoLog in the office today for her blood sugar of 400. We'll likely need to titrate the insulin doses higher.  We'll also likely need to add mealtime insulin.  We'll make that decision when patient follows up in 2 weeks.  I discussed with the patient today and explained to her that she may need to be on 4 shots per day of insulin in order to control her blood glucose.  She verbalized understanding.  She is slightly resistant to having to take 4 shots per day.  Hypoglycemia precautions discussed with the patient today.  Diabetic foot care discussed as well.  FOLLOW UP 2-3 weeks for diabetes check up   The patient was given clear instructions to go to ER or return to medical center if symptoms don't improve, worsen or new problems develop.  The patient verbalized understanding.  The patient was told to call to get lab results if they haven't heard anything in the next week.         Cleora Fleet, MD 12/23/12 [phone]

## 2012-12-23 NOTE — ED Notes (Signed)
Patient given sample of levemir  Flex pen LOT # [id number]  Ex: 07/2014

## 2012-12-23 NOTE — ED Notes (Signed)
Former Architect- history of DM, HTN needs medication refills

## 2012-12-24 LAB — HEMOGLOBIN A1C: Hgb A1c MFr Bld: 14.5 % — ABNORMAL HIGH (ref <5.7)

## 2012-12-25 NOTE — Progress Notes (Signed)
Quick Note:  Please notify patient that her diabetes mellitus is poorly controlled as evidenced by hemoglobin A1c of greater than 14%. The patient is at high risk for acute and chronic complications of poorly controlled diabetes mellitus. Please take the insulin that we prescribed and monitor blood glucose 4 times per day. Call us in one week with her blood glucose readings. We need to recheck all of her labs again in 3 months. Her cholesterol is also slightly elevated and we will recheck that in 3 months as well. Please send the patient some diabetes education materials in the mail for physical activity and diet. Also send her a blood glucose log book.  Rodney Langton, MD, CDE, FAAFP [facility] [place], [place]   ______

## 2012-12-26 ENCOUNTER — Telehealth (HOSPITAL_COMMUNITY): Payer: Self-pay

## 2012-12-26 NOTE — Telephone Encounter (Signed)
Message copied by Lestine Mount on Thu Dec 26, 2012  9:47 AM ------      Message from: Cleora Fleet      Created: Wed Dec 25, 2012  9:45 AM       Please notify patient that her diabetes mellitus is poorly controlled as evidenced by hemoglobin A1c of greater than 14%.  The patient is at high risk for acute and chronic complications of poorly controlled diabetes mellitus.  Please take the insulin that we prescribed and monitor blood glucose 4 times per day.  Call us in one week with her blood glucose readings.  We need to recheck all of her labs again in 3 months.  Her cholesterol is also slightly elevated and we will recheck that in 3 months as well.  Please send the patient some diabetes education materials in the mail for physical activity and diet.  Also send her a blood glucose log book.            Rodney Langton, MD, CDE, FAAFP      [facility]      [facility]      [place], [place]

## 2012-12-31 NOTE — Progress Notes (Signed)
Pt called reporting that she has lost all of her prescriptions and needing to have hard copies re-written so that [facility] can [facility].   Reprinted them from last office visit.   Rodney Langton, MD, CDE, FAAFP [facility] [place], [place]

## 2013-01-13 ENCOUNTER — Encounter (HOSPITAL_COMMUNITY): Payer: Self-pay

## 2013-01-13 ENCOUNTER — Emergency Department (INDEPENDENT_AMBULATORY_CARE_PROVIDER_SITE_OTHER)
Admission: EM | Admit: 2013-01-13 | Discharge: 2013-01-13 | Disposition: A | Discharge status: Home / Self Care | Payer: No Typology Code available for payment source | Source: Home / Self Care

## 2013-01-13 DIAGNOSIS — Z23 Encounter for immunization: Secondary | ICD-10-CM

## 2013-01-13 LAB — GLUCOSE, CAPILLARY: Glucose-Capillary: 344 mg/dL — ABNORMAL HIGH (ref 70–99)

## 2013-01-13 MED ORDER — INFLUENZA VIRUS VACC SPLIT PF IM SUSP
0.5000 mL | Freq: Once | INTRAMUSCULAR | Status: AC
  Administered 2013-01-13: 0.5 mL via INTRAMUSCULAR

## 2013-01-13 MED ORDER — INSULIN DETEMIR 100 UNIT/ML ~~LOC~~ SOLN: 35.0000 [IU] | Freq: Every day | SUBCUTANEOUS | Status: DC

## 2013-01-13 MED ORDER — HYDROCHLOROTHIAZIDE 12.5 MG PO TABS: 25.0000 mg | ORAL_TABLET | Freq: Every day | ORAL | Status: DC

## 2013-01-13 MED ORDER — METFORMIN HCL ER (OSM) 1000 MG PO TB24: ORAL_TABLET | ORAL | Status: DC

## 2013-01-13 NOTE — ED Provider Notes (Signed)
History     CSN: [id number]  Arrival date & time 01/13/13  1012   None     Chief Complaint  Patient presents with  . Follow-up    (Consider location/radiation/quality/duration/timing/severity/associated sxs/prior treatment) HPI 57 year old female with uncontrolled diabetes, dyslipidemia, hypertension who presents to the clinic for followup. Patient was seen earlier this week in the clinic and was prescribed on Levemir 25 units at bedtime and metformin 500 mg twice a day with plan to escalate. Patient informs taking her medication regularly however during the first week of the sugars were ranging from 300 to 400s and for the last 1 week it has been in the range of 122-220 and has been vomiting her sugars 2-3 times a day. She forgot to bring her glucose monitoring log to the clinic today. Patient denies any blurry vision, headache, nausea, vomiting, abdominal pain, tingling or numbness in extremities, polyuria, polydipsia, bowel or urinary symptoms. Denies chest pain palpitations or shortness of breath. Denies any fever or chills. He informs that during the first week being on metformin made her dizzy and she is finally started to tolerate it. On reviewing her previous note it appears that she was on October 20 units of Lantus per day in the past.   Past Medical History  Diagnosis Date  . Diabetes mellitus   . Hypertension   . Boil of buttock     History reviewed. No pertinent past surgical history.  No family history on file.  History  Substance Use Topics  . Smoking status: Never Smoker   . Smokeless tobacco: Not on file  . Alcohol Use: No    OB History   Grav Para Term Preterm Abortions TAB SAB Ect Mult Living                  Review of Systems  Allergies  Other  Home Medications   Current Outpatient Rx  Name  Route  Sig  Dispense  Refill  . aspirin EC 81 MG tablet   Oral   Take 81 mg by mouth daily.         . hydrochlorothiazide (HYDRODIURIL) 12.5 MG  tablet   Oral   Take 2 tablets (25 mg total) by mouth daily.   30 tablet   3   . insulin detemir (LEVEMIR FLEXPEN) 100 UNIT/ML injection   Subcutaneous   Inject 35 Units into the skin at bedtime. Take 25 units daily at bedtime, no later than 10 pm, or as directed by physician   10 mL   5   . Insulin Pen Needle (B-D ULTRAFINE III SHORT PEN) 31G X 8 MM MISC   Does not apply   1 Units by Does not apply route as directed.   100 each   3   . lisinopril (PRINIVIL,ZESTRIL) 40 MG tablet   Oral   Take 1 tablet (40 mg total) by mouth daily.   30 tablet   3   . loratadine (CLARITIN) 10 MG tablet   Oral   Take 1 tablet (10 mg total) by mouth daily.   30 tablet   3   . metformin (GLUCOPHAGE XR) 1000 MG (OSM) 24 hr tablet      1 tablet bid   60 tablet   3   . Multiple Vitamin (MULTIVITAMIN WITH MINERALS) TABS   Oral   Take 1 tablet by mouth daily.         . traMADol (ULTRAM) 50 MG tablet   Oral  Take 1 tablet (50 mg total) by mouth every 12 (twelve) hours as needed for pain.   30 tablet   0     BP 192/75  Pulse 72  Temp(Src) 98.6 F (37 C) (Oral)  SpO2 99%  Physical Exam  ED Course  Procedures (including critical care time)  Labs Reviewed  GLUCOSE, CAPILLARY - Abnormal; Notable for the following:    Glucose-Capillary 344 (*)    All other components within normal limits   No results found.   1. DIABETES MELLITUS, TYPE II, UNCONTROLLED   Patient informs taking both metformin and Lantus as prescribed. She informs her blood glucose ranging from 300-400 during the first week after her visit with the doctor earlier this month. However for past one week the blood sugar has been ranging from 122-220 only. On monitoring her blood glucose in the clinic today her blood glucose is 344. I Strongly doubt that her fingersticks are stable and controlled at home and the reading that she is getting for the past one week is accurate. Patient denies eating anything since this  morning as well. I will increase her Levemir dose to 35 units at bedtime and also increase the metformin 1000 mg by mouth twice a day. She had significantly uncontrolled diabetes with an A1c all 14.5 which was recently checked. She was encouraged on taking her medications regularly and keeping a log for blood glucose monitoring. She did not bring her log today and was encouraged to bring it during her next visit. She was also encouraged on proper diet intake and regular exercise. Patient is quite low as she does not have a job and has been constantly looking for one and also that she does not have enough money to buy foods and is waiting for food stamps. Patient will be followed up closely during her next visit and likely will need to increase the dose of insulin again.   2. HYPERTENSION, BENIGN ESSENTIAL    Blood pressure noted to be elevated with systolic BP of 192 today. Patient is on HCTZ and lisinopril. I will increase the dose of HCTZ to 25 mg by mouth daily. We will monitor her blood pressure again when she returns for followup in 3-4 weeks.  Health maintenance Patient agrees on getting flu vaccination today.  Follow up in the clinic for her blood pressure monitoring and her glucose monitoring in 3-4 weeks   MDM  Patient has been instructed to bring her blood glucose monitoring log on her visit in 3-4 weeks. She has very uncontrolled diabetes with an elevated A1c of 14.5 and needs closer monitoring.        Eddie North, MD 01/13/13 2130

## 2013-01-13 NOTE — ED Notes (Signed)
Follow up DM

## 2013-02-03 ENCOUNTER — Emergency Department (INDEPENDENT_AMBULATORY_CARE_PROVIDER_SITE_OTHER)
Admission: EM | Admit: 2013-02-03 | Discharge: 2013-02-03 | Disposition: A | Discharge status: Home / Self Care | Payer: No Typology Code available for payment source | Source: Home / Self Care

## 2013-02-03 ENCOUNTER — Encounter (HOSPITAL_COMMUNITY): Payer: Self-pay

## 2013-02-03 DIAGNOSIS — E7800 Pure hypercholesterolemia, unspecified: Secondary | ICD-10-CM

## 2013-02-03 DIAGNOSIS — B379 Candidiasis, unspecified: Secondary | ICD-10-CM

## 2013-02-03 DIAGNOSIS — IMO0001 Reserved for inherently not codable concepts without codable children: Secondary | ICD-10-CM

## 2013-02-03 MED ORDER — TRAMADOL HCL 50 MG PO TABS: 50.0000 mg | ORAL_TABLET | Freq: Two times a day (BID) | ORAL | Status: DC | PRN

## 2013-02-03 MED ORDER — OXYCODONE-ACETAMINOPHEN 5-325 MG PO TABS: 1.0000 | ORAL_TABLET | ORAL | Status: DC | PRN

## 2013-02-03 MED ORDER — LORATADINE 10 MG PO TABS: 10.0000 mg | ORAL_TABLET | Freq: Every day | ORAL | Status: DC

## 2013-02-03 MED ORDER — FLUCONAZOLE 100 MG PO TABS: 100.0000 mg | ORAL_TABLET | Freq: Every day | ORAL | Status: DC

## 2013-02-03 MED ORDER — METFORMIN HCL ER (OSM) 1000 MG PO TB24: 1000.0000 mg | ORAL_TABLET | Freq: Two times a day (BID) | ORAL | Status: DC

## 2013-02-03 MED ORDER — INSULIN DETEMIR 100 UNIT/ML ~~LOC~~ SOLN: 20.0000 [IU] | Freq: Every day | SUBCUTANEOUS | Status: DC

## 2013-02-03 NOTE — ED Provider Notes (Signed)
History     CSN: [id number]  Arrival date & time 02/03/13  1019   None     Chief Complaint  Patient presents with  . Follow-up    (Consider location/radiation/quality/duration/timing/severity/associated sxs/prior treatment) HPI Patient is 57 year old female who presents for followup on diabetes. She reports that her regimen was changed last visit and she has tried taking medicines as prescribed but noted to many sugar lows and she has not been feeling well. She is asking if she can try a different regimen today. She did not bring the log book with her today but reports many episodes of sugars in 60s and 70s. When that happens she expresses feeling of malaise and poor oral intake associated with nausea. She reports compliance with medications as well as recommended diet and exercise. She denies fevers and chills, specific abdominal or urinary concerns, no recent sicknesses or hospitalizations. Past Medical History  Diagnosis Date  . Diabetes mellitus   . Hypertension   . Boil of buttock    Family history of hypertension  History  Substance Use Topics  . Smoking status: Never Smoker   . Smokeless tobacco: Not on file  . Alcohol Use: No    OB History   Grav Para Term Preterm Abortions TAB SAB Ect Mult Living                  Review of Systems  Constitutional: Negative for fever, chills, diaphoresis, activity change, appetite change and fatigue.  HENT: Negative for ear pain, nosebleeds, congestion, facial swelling, rhinorrhea, neck pain, neck stiffness and ear discharge.   Eyes: Negative for pain, discharge, redness, itching and visual disturbance.  Respiratory: Negative for cough, choking, chest tightness, shortness of breath, wheezing and stridor.   Cardiovascular: Negative for chest pain, palpitations and leg swelling.  Gastrointestinal: Negative for abdominal distention.  Genitourinary: Negative for dysuria, urgency, frequency, hematuria, flank pain, decreased urine  volume, difficulty urinating and dyspareunia.  Musculoskeletal: Negative for back pain, joint swelling, arthralgias and gait problem.  Neurological: Negative for dizziness, tremors, seizures, syncope, facial asymmetry, speech difficulty, weakness, light-headedness, numbness and headaches.  Hematological: Negative for adenopathy. Does not bruise/bleed easily.  Psychiatric/Behavioral: Negative for hallucinations, behavioral problems, confusion, dysphoric mood, decreased concentration and agitation.    Allergies  Other  Home Medications   Current Outpatient Rx  Name  Route  Sig  Dispense  Refill  . aspirin EC 81 MG tablet   Oral   Take 81 mg by mouth daily.         . fluconazole (DIFLUCAN) 100 MG tablet   Oral   Take 1 tablet (100 mg total) by mouth daily.   14 tablet   0   . hydrochlorothiazide (HYDRODIURIL) 12.5 MG tablet   Oral   Take 2 tablets (25 mg total) by mouth daily.   30 tablet   3   . insulin detemir (LEVEMIR FLEXPEN) 100 UNIT/ML injection   Subcutaneous   Inject 20 Units into the skin at bedtime. Take 25 units daily at bedtime, no later than 10 pm, or as directed by physician   10 mL   5   . Insulin Pen Needle (B-D ULTRAFINE III SHORT PEN) 31G X 8 MM MISC   Does not apply   1 Units by Does not apply route as directed.   100 each   3   . lisinopril (PRINIVIL,ZESTRIL) 40 MG tablet   Oral   Take 1 tablet (40 mg total) by mouth daily.  30 tablet   3   . loratadine (CLARITIN) 10 MG tablet   Oral   Take 1 tablet (10 mg total) by mouth daily.   30 tablet   3   . metformin (FORTAMET) 1000 MG (OSM) 24 hr tablet   Oral   Take 1 tablet (1,000 mg total) by mouth 2 (two) times daily with a meal.   60 tablet   3   . Multiple Vitamin (MULTIVITAMIN WITH MINERALS) TABS   Oral   Take 1 tablet by mouth daily.         Marland Kitchen oxyCODONE-acetaminophen (PERCOCET/ROXICET) 5-325 MG per tablet   Oral   Take 1 tablet by mouth every 4 (four) hours as needed for pain.    30 tablet   0   . traMADol (ULTRAM) 50 MG tablet   Oral   Take 1 tablet (50 mg total) by mouth every 12 (twelve) hours as needed for pain.   60 tablet   0     BP 193/76  Pulse 85  Temp(Src) 97.9 F (36.6 C) (Oral)  Resp 18  SpO2 100%  Physical Exam Constitutional: Appears well-developed and well-nourished. No distress.  HENT: Normocephalic. External right and left ear normal. Oropharynx is clear and moist.  Eyes: Conjunctivae and EOM are normal. PERRLA, no scleral icterus.  Neck: Normal ROM. Neck supple. No JVD. No tracheal deviation. No thyromegaly.  CVS: RRR, S1/S2 +, no murmurs, no gallops, no carotid bruit.  Pulmonary: Effort and breath sounds normal, no stridor, rhonchi, wheezes, rales.  Abdominal: Soft. BS +,  no distension, tenderness, rebound or guarding.  Musculoskeletal: Normal range of motion. No edema and no tenderness.  Lymphadenopathy: No lymphadenopathy noted, cervical, inguinal. Neuro: Alert. Normal reflexes, muscle tone coordination. No cranial nerve deficit. Skin: Skin is warm and dry. No rash noted. Not diaphoretic. No erythema. No pallor.  Psychiatric: Normal mood and affect. Behavior, judgment, thought content normal.    ED Course  Procedures (including critical care time)  Labs Reviewed - No data to display No results found.   1. HYPERCHOLESTEROLEMIA - continue statin   2. DIABETES MELLITUS, TYPE II, UNCONTROLLED - please see discussion below   3. CANDIDIASIS - course of fluconazole prescribe   - we have discussed diabetic regimen, patient advised to take metformin 1000 mg tablet twice daily, continue insulin 20 units at bedtime - I have recommended that patient continues taking her sugar checks and to bring logbook with her so we can evaluate the numbers and readjust her regimen appropriately - I have discussed importance of regular exercise as well as following a recommended diet - I he'll also provided refill on insulin and metformin - I have  advised patient to check blood pressure regularly and to call if the numbers are persistently higher than 140/90 - I have discussed with patient meaning of A1c and that her value is higher than 14 - We have discussed normal A1c and the importance of regular followup on A1c every 3 months patient has verbalized understanding - Patient advised to come back in one month at which point she will need A1c checked    MDM  Diabetes and HTN        Dorothea Ogle, MD 02/03/13 1109

## 2013-02-03 NOTE — ED Notes (Signed)
Follow up DM

## 2013-05-14 ENCOUNTER — Other Ambulatory Visit: Payer: Self-pay

## 2013-05-14 DIAGNOSIS — Z1231 Encounter for screening mammogram for malignant neoplasm of breast: Secondary | ICD-10-CM

## 2013-05-19 ENCOUNTER — Ambulatory Visit: Payer: No Typology Code available for payment source

## 2013-08-19 ENCOUNTER — Emergency Department (HOSPITAL_COMMUNITY)
Admission: EM | Admit: 2013-08-19 | Discharge: 2013-08-19 | Disposition: A | Discharge status: Home / Self Care | Payer: Self-pay | Attending: Emergency Medicine | Admitting: Emergency Medicine

## 2013-08-19 ENCOUNTER — Encounter (HOSPITAL_COMMUNITY): Payer: Self-pay | Admitting: Emergency Medicine

## 2013-08-19 DIAGNOSIS — Z872 Personal history of diseases of the skin and subcutaneous tissue: Secondary | ICD-10-CM | POA: Insufficient documentation

## 2013-08-19 DIAGNOSIS — Z79899 Other long term (current) drug therapy: Secondary | ICD-10-CM | POA: Insufficient documentation

## 2013-08-19 DIAGNOSIS — I10 Essential (primary) hypertension: Secondary | ICD-10-CM | POA: Insufficient documentation

## 2013-08-19 DIAGNOSIS — L259 Unspecified contact dermatitis, unspecified cause: Secondary | ICD-10-CM | POA: Insufficient documentation

## 2013-08-19 DIAGNOSIS — E119 Type 2 diabetes mellitus without complications: Secondary | ICD-10-CM | POA: Insufficient documentation

## 2013-08-19 DIAGNOSIS — R21 Rash and other nonspecific skin eruption: Secondary | ICD-10-CM | POA: Insufficient documentation

## 2013-08-19 MED ORDER — FAMOTIDINE 20 MG PO TABS
20.0000 mg | ORAL_TABLET | Freq: Once | ORAL | Status: AC
  Administered 2013-08-19: 20 mg via ORAL
  Filled 2013-08-19: qty 1

## 2013-08-19 MED ORDER — FAMOTIDINE 20 MG PO TABS: 20.0000 mg | ORAL_TABLET | Freq: Two times a day (BID) | ORAL | Status: DC

## 2013-08-19 NOTE — ED Provider Notes (Signed)
CSN: [id number]     Arrival date & time 08/19/13  2031 History   First MD Initiated Contact with Patient 08/19/13 2226     Chief Complaint  Patient presents with  . Insect Bite   (Consider location/radiation/quality/duration/timing/severity/associated sxs/prior Treatment) HPI Comments: Patient presents tonight with itchy lesions around her neck, just at the collar line that started last night, but worsened.  This morning after a warm shower.  She has tried topical "anti-itch."  Medications with some relief.  Denies any generalized myalgias, fevers, tachycardia, shortness of breath, throat, swelling, since sitting in the emergency department waiting room.  She has developed a new lesion on her left forearm, and right upper arm.  Patient denies having any indoor pets  The history is provided by the patient.    Past Medical History  Diagnosis Date  . Diabetes mellitus   . Hypertension   . Boil of buttock    History reviewed. No pertinent past surgical history. No family history on file. History  Substance Use Topics  . Smoking status: Never Smoker   . Smokeless tobacco: Not on file  . Alcohol Use: No   OB History   Grav Para Term Preterm Abortions TAB SAB Ect Mult Living                 Review of Systems  Constitutional: Negative for fever.  HENT: Negative for rhinorrhea and trouble swallowing.   Respiratory: Negative for shortness of breath.   Cardiovascular: Negative for chest pain and palpitations.  Gastrointestinal: Negative for abdominal pain.  Skin: Positive for rash.  Neurological: Negative for dizziness and headaches.  All other systems reviewed and are negative.    Allergies  Other  Home Medications   Current Outpatient Rx  Name  Route  Sig  Dispense  Refill  . calcium carbonate (TUMS EX) 750 MG chewable tablet   Oral   Chew 2 tablets by mouth 2 (two) times daily as needed for heartburn.         . Insulin Detemir (LEVEMIR FLEXPEN) 100 UNIT/ML SOPN  Subcutaneous   Inject 25 Units into the skin at bedtime as needed (when sugar is higher than 200).         . Insulin Pen Needle (B-D ULTRAFINE III SHORT PEN) 31G X 8 MM MISC   Does not apply   1 Units by Does not apply route as directed.   100 each   3   . lisinopril (PRINIVIL,ZESTRIL) 40 MG tablet   Oral   Take 1 tablet (40 mg total) by mouth daily.   30 tablet   3   . loratadine (CLARITIN) 10 MG tablet   Oral   Take 10 mg by mouth as needed for allergies.         Marland Kitchen metformin (FORTAMET) 1000 MG (OSM) 24 hr tablet   Oral   Take 1 tablet (1,000 mg total) by mouth 2 (two) times daily with a meal.   60 tablet   3   . naproxen sodium (ANAPROX) 220 MG tablet   Oral   Take 220 mg by mouth as needed (pain).          BP 179/92  Pulse 73  Temp(Src) 98.8 F (37.1 C)  Resp 16  Ht 6' (1.829 m)  Wt 200 lb 1 oz (90.748 kg)  BMI 27.13 kg/m2  SpO2 98% Physical Exam  Nursing note and vitals reviewed. Constitutional: She is oriented to person, place, and time. She appears well-developed  and well-nourished.  HENT:  Head: Normocephalic.  Eyes: Pupils are equal, round, and reactive to light.  Neck: Normal range of motion.  Cardiovascular: Normal rate and regular rhythm.   Pulmonary/Chest: Effort normal and breath sounds normal.  Musculoskeletal: Normal range of motion. She exhibits no edema and no tenderness.  Lymphadenopathy:    She has no cervical adenopathy.  Neurological: She is alert and oriented to person, place, and time.  Skin: Skin is warm and dry. Rash noted.    ED Course  Procedures (including critical care time) Labs Review Labs Reviewed - No data to display Imaging Review No results found.  MDM  No diagnosis found.  Location of lesions.  Make me believe that this is a contact dermatitis, as they're around the collar line, and at the end of the short sleeve shirt.  Patient denies any new clothing, new soaps or cosmetics, body products. At this point, I  do not feel that she needs steroids, as she is a diabetic, and this will falsely elevate her blood sugars, but will treat with Pepcid twice a day for symptom control.    Arman Filter, NP 08/19/13 2251

## 2013-08-19 NOTE — ED Notes (Signed)
Pt. Reports itchy insect bites at neck this morning , respirations unlabored / airway intact .

## 2013-08-19 NOTE — ED Provider Notes (Signed)
History/physical exam/procedure(s) were performed by non-physician practitioner and as supervising physician I was immediately available for consultation/collaboration. I have reviewed all notes and am in agreement with care and plan.   Merric Yost S Jaydyn Bozzo, MD 08/19/13 2342 

## 2013-08-19 NOTE — ED Notes (Signed)
Pt ambulated to POD E Room 45 with slow, steady gait.

## 2013-11-26 ENCOUNTER — Emergency Department (HOSPITAL_COMMUNITY): Payer: No Typology Code available for payment source

## 2013-11-26 ENCOUNTER — Encounter (HOSPITAL_COMMUNITY): Payer: Self-pay | Admitting: Emergency Medicine

## 2013-11-26 ENCOUNTER — Emergency Department (HOSPITAL_COMMUNITY)
Admission: EM | Admit: 2013-11-26 | Discharge: 2013-11-26 | Disposition: A | Discharge status: Home / Self Care | Payer: No Typology Code available for payment source | Attending: Emergency Medicine | Admitting: Emergency Medicine

## 2013-11-26 DIAGNOSIS — R110 Nausea: Secondary | ICD-10-CM | POA: Insufficient documentation

## 2013-11-26 DIAGNOSIS — E119 Type 2 diabetes mellitus without complications: Secondary | ICD-10-CM | POA: Insufficient documentation

## 2013-11-26 DIAGNOSIS — Z79899 Other long term (current) drug therapy: Secondary | ICD-10-CM | POA: Insufficient documentation

## 2013-11-26 DIAGNOSIS — Z794 Long term (current) use of insulin: Secondary | ICD-10-CM | POA: Insufficient documentation

## 2013-11-26 DIAGNOSIS — J189 Pneumonia, unspecified organism: Secondary | ICD-10-CM

## 2013-11-26 DIAGNOSIS — R739 Hyperglycemia, unspecified: Secondary | ICD-10-CM

## 2013-11-26 DIAGNOSIS — J159 Unspecified bacterial pneumonia: Secondary | ICD-10-CM | POA: Insufficient documentation

## 2013-11-26 DIAGNOSIS — L97509 Non-pressure chronic ulcer of other part of unspecified foot with unspecified severity: Secondary | ICD-10-CM | POA: Insufficient documentation

## 2013-11-26 DIAGNOSIS — R1013 Epigastric pain: Secondary | ICD-10-CM | POA: Insufficient documentation

## 2013-11-26 DIAGNOSIS — I10 Essential (primary) hypertension: Secondary | ICD-10-CM | POA: Insufficient documentation

## 2013-11-26 DIAGNOSIS — Z792 Long term (current) use of antibiotics: Secondary | ICD-10-CM | POA: Insufficient documentation

## 2013-11-26 LAB — COMPREHENSIVE METABOLIC PANEL
ALT: 9 U/L (ref 0–35)
AST: 9 U/L (ref 0–37)
Albumin: 3.3 g/dL — ABNORMAL LOW (ref 3.5–5.2)
Alkaline Phosphatase: 119 U/L — ABNORMAL HIGH (ref 39–117)
BILIRUBIN TOTAL: 0.5 mg/dL (ref 0.3–1.2)
BUN: 13 mg/dL (ref 6–23)
CALCIUM: 8.7 mg/dL (ref 8.4–10.5)
CHLORIDE: 99 meq/L (ref 96–112)
CO2: 27 mEq/L (ref 19–32)
CREATININE: 0.62 mg/dL (ref 0.50–1.10)
GFR calc Af Amer: >90 mL/min (ref >90)
Glucose, Bld: 379 mg/dL — ABNORMAL HIGH (ref 70–99)
Potassium: 4 mEq/L (ref 3.7–5.3)
Sodium: 139 mEq/L (ref 137–147)
Total Protein: 7 g/dL (ref 6.0–8.3)

## 2013-11-26 LAB — URINALYSIS, ROUTINE W REFLEX MICROSCOPIC
Bilirubin Urine: NEGATIVE
Glucose, UA: >1000 mg/dL — AB
Ketones, ur: NEGATIVE mg/dL
Leukocytes, UA: NEGATIVE
Nitrite: NEGATIVE
Protein, ur: NEGATIVE mg/dL
SPECIFIC GRAVITY, URINE: 1.033 — AB (ref 1.005–1.030)
UROBILINOGEN UA: 1 mg/dL (ref 0.0–1.0)
pH: 6.5 (ref 5.0–8.0)

## 2013-11-26 LAB — GLUCOSE, CAPILLARY
GLUCOSE-CAPILLARY: 379 mg/dL — AB (ref 70–99)
Glucose-Capillary: 337 mg/dL — ABNORMAL HIGH (ref 70–99)
Glucose-Capillary: 270 mg/dL — ABNORMAL HIGH (ref 70–99)

## 2013-11-26 LAB — CBC WITH DIFFERENTIAL/PLATELET
BASOS ABS: 0 10*3/uL (ref 0.0–0.1)
Basophils Relative: 1 % (ref 0–1)
EOS PCT: 3 % (ref 0–5)
Eosinophils Absolute: 0.1 10*3/uL (ref 0.0–0.7)
HEMATOCRIT: 42.8 % (ref 36.0–46.0)
HEMOGLOBIN: 14.7 g/dL (ref 12.0–15.0)
LYMPHS PCT: 37 % (ref 12–46)
Lymphs Abs: 1.4 10*3/uL (ref 0.7–4.0)
MCH: 28.3 pg (ref 26.0–34.0)
MCHC: 34.3 g/dL (ref 30.0–36.0)
MCV: 82.5 fL (ref 78.0–100.0)
MONO ABS: 0.2 10*3/uL (ref 0.1–1.0)
MONOS PCT: 4 % (ref 3–12)
NEUTROS ABS: 2.2 10*3/uL (ref 1.7–7.7)
Neutrophils Relative %: 56 % (ref 43–77)
Platelets: 254 10*3/uL (ref 150–400)
RBC: 5.19 MIL/uL — ABNORMAL HIGH (ref 3.87–5.11)
RDW: 12.5 % (ref 11.5–15.5)
WBC: 3.9 10*3/uL — ABNORMAL LOW (ref 4.0–10.5)

## 2013-11-26 LAB — URINE MICROSCOPIC-ADD ON

## 2013-11-26 LAB — POCT I-STAT TROPONIN I: Troponin i, poc: 0 ng/mL (ref 0.00–0.08)

## 2013-11-26 LAB — PRO B NATRIURETIC PEPTIDE: Pro B Natriuretic peptide (BNP): 494 pg/mL — ABNORMAL HIGH (ref 0–125)

## 2013-11-26 MED ORDER — INSULIN ASPART 100 UNIT/ML ~~LOC~~ SOLN
10.0000 [IU] | Freq: Once | SUBCUTANEOUS | Status: AC
  Administered 2013-11-26: 10 [IU] via SUBCUTANEOUS
  Filled 2013-11-26: qty 1

## 2013-11-26 MED ORDER — KETOROLAC TROMETHAMINE 30 MG/ML IJ SOLN
30.0000 mg | Freq: Once | INTRAMUSCULAR | Status: AC
  Administered 2013-11-26: 30 mg via INTRAVENOUS
  Filled 2013-11-26: qty 1

## 2013-11-26 MED ORDER — ONDANSETRON HCL 4 MG/2ML IJ SOLN
4.0000 mg | Freq: Once | INTRAMUSCULAR | Status: AC
  Administered 2013-11-26: 4 mg via INTRAVENOUS
  Filled 2013-11-26: qty 2

## 2013-11-26 MED ORDER — AZITHROMYCIN 250 MG PO TABS: ORAL_TABLET | ORAL | Status: DC

## 2013-11-26 MED ORDER — SODIUM CHLORIDE 0.9 % IV BOLUS (SEPSIS)
1000.0000 mL | Freq: Once | INTRAVENOUS | Status: AC
  Administered 2013-11-26: 1000 mL via INTRAVENOUS

## 2013-11-26 MED ORDER — GABAPENTIN 300 MG PO CAPS: 300.0000 mg | ORAL_CAPSULE | Freq: Three times a day (TID) | ORAL | Status: DC

## 2013-11-26 NOTE — ED Provider Notes (Signed)
CSN: [id number]     Arrival date & time 11/26/13  [id number] History   First MD Initiated Contact with Patient 11/26/13 1131     Chief Complaint  Patient presents with  . Shortness of Breath  . Hyperglycemia  . Wound Infection   (Consider location/radiation/quality/duration/timing/severity/associated sxs/prior Treatment) HPI Comments: Patient is a 58 year old female with a past medical history of diabetes and hypertension presents to the emergency department complaining of hyperglycemia x3 weeks. Patient states over the past 3 weeks her blood sugar levels have been ranging between 313 and 480. She states she is not taking her medications as prescribed, PCP has advised her to take a higher dose of metformin and she has been taking, but patient states she does not want to take that amount because when her blood sugar drops below 250 she does not feel right. She normally tries to keep her blood sugar levels around 200 and no less. She has not seen her PCP in over 4 months. Also states she has had pain underneath her right foot described as sharp pins. She has a wound to her right toe which has not changed in size. Also states she has been short of breath over the past 3 weeks, worse on exertion, worsening over the past few days. Denies fever, chills, cough, chest pain or vomiting. She is nauseated, very thirsty. No changes in urine output.  Patient is a 58 y.o. female presenting with shortness of breath and hyperglycemia. The history is provided by the patient.  Shortness of Breath Hyperglycemia Associated symptoms: increased thirst, nausea and shortness of breath     Past Medical History  Diagnosis Date  . Diabetes mellitus   . Hypertension   . Boil of buttock    Past Surgical History  Procedure Laterality Date  . Appendectomy     No family history on file. History  Substance Use Topics  . Smoking status: Never Smoker   . Smokeless tobacco: Not on file  . Alcohol Use: No   OB History   Grav Para Term Preterm Abortions TAB SAB Ect Mult Living                 Review of Systems  Respiratory: Positive for shortness of breath.   Gastrointestinal: Positive for nausea.  Endocrine: Positive for polydipsia.  Musculoskeletal:       Positive for right foot pain.  Skin: Positive for wound.  All other systems reviewed and are negative.    Allergies  Other  Home Medications   Current Outpatient Rx  Name  Route  Sig  Dispense  Refill  . Insulin Detemir (LEVEMIR FLEXPEN) 100 UNIT/ML SOPN   Subcutaneous   Inject 25 Units into the skin at bedtime as needed (when sugar is higher than 200).         Marland Kitchen lisinopril (PRINIVIL,ZESTRIL) 40 MG tablet   Oral   Take 1 tablet (40 mg total) by mouth daily.   30 tablet   3   . loratadine (CLARITIN) 10 MG tablet   Oral   Take 10 mg by mouth as needed for allergies.         . metFORMIN (GLUCOPHAGE) 500 MG tablet   Oral   Take 1,000 mg by mouth 2 (two) times daily with a meal.         . azithromycin (ZITHROMAX Z-PAK) 250 MG tablet      2 po day one, then 1 daily x 4 days   6 tablet  0   . gabapentin (NEURONTIN) 300 MG capsule   Oral   Take 1 capsule (300 mg total) by mouth 3 (three) times daily.   20 capsule   0    BP 173/97  Pulse 73  Temp(Src) 97.7 F (36.5 C) (Oral)  Resp 20  SpO2 97% Physical Exam  Nursing note and vitals reviewed. Constitutional: She is oriented to person, place, and time. She appears well-developed and well-nourished. No distress.  HENT:  Head: Normocephalic and atraumatic.  Mouth/Throat: Oropharynx is clear and moist.  Eyes: Conjunctivae and EOM are normal. Pupils are equal, round, and reactive to light.  Neck: Normal range of motion. Neck supple.  Cardiovascular: Normal rate, regular rhythm, normal heart sounds and intact distal pulses.   Pulses:      Dorsalis pedis pulses are 2+ on the right side, and 2+ on the left side.       Posterior tibial pulses are 2+ on the right side, and  2+ on the left side.  No extremity edema.  Pulmonary/Chest: Effort normal and breath sounds normal. No respiratory distress. She has no decreased breath sounds. She has no wheezes. She has no rhonchi. She has no rales.  Abdominal: Soft. Normal appearance and bowel sounds are normal. There is tenderness in the epigastric area. There is no rigidity, no rebound and no guarding.  No peritoneal signs.  Musculoskeletal: Normal range of motion. She exhibits no edema.       Feet:  Right foot tender over second toe. No other tenderness, no edema. Normal ROM.  Neurological: She is alert and oriented to person, place, and time.  Skin: Skin is warm and dry. She is not diaphoretic.  Psychiatric: She has a normal mood and affect. Her behavior is normal.    ED Course  Procedures (including critical care time) Labs Review Labs Reviewed  PRO B NATRIURETIC PEPTIDE - Abnormal; Notable for the following:    Pro B Natriuretic peptide (BNP) 494.0 (*)    All other components within normal limits  CBC WITH DIFFERENTIAL - Abnormal; Notable for the following:    WBC 3.9 (*)    RBC 5.19 (*)    All other components within normal limits  COMPREHENSIVE METABOLIC PANEL - Abnormal; Notable for the following:    Glucose, Bld 379 (*)    Albumin 3.3 (*)    Alkaline Phosphatase 119 (*)    All other components within normal limits  URINALYSIS, ROUTINE W REFLEX MICROSCOPIC - Abnormal; Notable for the following:    Specific Gravity, Urine 1.033 (*)    Glucose, UA >1000 (*)    Hgb urine dipstick TRACE (*)    All other components within normal limits  GLUCOSE, CAPILLARY - Abnormal; Notable for the following:    Glucose-Capillary 379 (*)    All other components within normal limits  GLUCOSE, CAPILLARY - Abnormal; Notable for the following:    Glucose-Capillary 337 (*)    All other components within normal limits  GLUCOSE, CAPILLARY - Abnormal; Notable for the following:    Glucose-Capillary 270 (*)    All other  components within normal limits  URINE MICROSCOPIC-ADD ON  POCT I-STAT TROPONIN I   Imaging Review Dg Chest 2 View  11/26/2013   CLINICAL DATA:  For breath.  Diabetic foot wound.  Hyperglycemia.  EXAM: CHEST  2 VIEW  COMPARISON:  12/06/2012.  FINDINGS: There is an ill-defined right apical opacity overlying the right anterior 2nd rib end. This is not seen on the lateral  view. Differential considerations include small focus of bronchopneumonia or a pulmonary nodule. If the patient has infectious symptoms, course of antibiotic therapy and repeat Chest radiograph recommended. Followup to ensure radiographic clearing and exclude an underlying lesion is recommended. Typically clearing will be observed at 8 weeks.  There is no pleural effusion. The cardiopericardial silhouette is within normal limits.  IMPRESSION: Small focus of patchy airspace opacity in the right upper lobe likely represents bronchopneumonia. See discussion above.   Electronically Signed   By: Dereck Ligas M.D.   On: 11/26/2013 10:55    EKG Interpretation   None       MDM   1. Hyperglycemia   2. CAP (community acquired pneumonia)     Pt presenting with 3 weeks of hyperglycemia, right foot pain, sob. She appears in NAD, O2 sat 98% on RA, afebrile, VSS. BP initially recorded as 214/105, however during my examination BP 162/82. Pt is non-compliant with her medications, does not like blood sugar lower than 250. I discussed importance of medication compliance. Lungs clear, pt is non-smoker, however given increased SOB and finding on CXR, will tx for pneumonia with azithromycin. Wound on right toe not infected. Pain in right foot is most likely from diabetic neuropathy. Pulses intact, foot warm. Discussed controlling blood sugars to control pain. Glucose 379, will give IV fluids, Zofran and pain control. Plan to recheck blood sugar after fluids, discharge home, followup with PCP to discuss medications. 3:38 PM Glucose decreased to 270.  Nausea subsided. Tolerating PO without any difficulty. She is stable for discharge. Reinforced importance of medication compliance. Return precautions given. Patient states understanding of treatment care plan and is agreeable.    Illene Labrador, PA-C 11/26/13 1540

## 2013-11-26 NOTE — Discharge Planning (Signed)
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 Patient stated she has an appointment coming up next week. Patient was given my contact information for any questions and concerns. [facility] case managers notified of ED utilization

## 2013-11-26 NOTE — ED Notes (Signed)
[facility]G 337, PA notified

## 2013-11-26 NOTE — ED Notes (Signed)
Pt is here with 2 week history of elevated blood sugars from 313 to 480.  Pt reports right foot pain and has wound to right second toe-palpable pulse.  Pt reports weakness, nausea, and shortness of breath.  No chest pain but is diabetic.

## 2013-11-26 NOTE — Progress Notes (Signed)
ED CM noted patient to be without PCP and insurance. Pt presented to [facility] ED with Shortness of Breath  Hyperglycemia, and Wound Infection. In to speak with patient. She states, that she has an appointment with [facility] on 1/16, and does have an [employer].  Encouraged patient to keep the appointment. Pt received a prescription for Azithromycin (Z-pak)  and gabapentin (NEURONTIN) 300 MG. Pt states, she may not be able to Afford the Medications. Explained that she can have them filled at the [facility] for reduce pricing. Pt in agreement, will contact [place] [facility] schedulers by email  to make them aware. No further concerns or questions teach back method used. No further CM needs identified

## 2013-11-26 NOTE — Discharge Instructions (Signed)
Take gabapentin as prescribed for your pain. Take antibiotic to completion for your pneumonia. It is important to have your sugars controlled and for you to follow up with your primary care doctor to discuss your medications.  Hyperglycemia Hyperglycemia occurs when the glucose (sugar) in your blood is too high. Hyperglycemia can happen for many reasons, but it most often happens to people who do not know they have diabetes or are not managing their diabetes properly.  CAUSES  Whether you have diabetes or not, there are other causes of hyperglycemia. Hyperglycemia can occur when you have diabetes, but it can also occur in other situations that you might not be as aware of, such as: Diabetes  If you have diabetes and are having problems controlling your blood glucose, hyperglycemia could occur because of some of the following reasons:  Not following your meal plan.  Not taking your diabetes medications or not taking it properly.  Exercising less or doing less activity than you normally do.  Being sick. Pre-diabetes  This cannot be ignored. Before people develop Type 2 diabetes, they almost always have "pre-diabetes." This is when your blood glucose levels are higher than normal, but not yet high enough to be diagnosed as diabetes. Research has shown that some long-term damage to the body, especially the heart and circulatory system, may already be occurring during pre-diabetes. If you take action to manage your blood glucose when you have pre-diabetes, you may delay or prevent Type 2 diabetes from developing. Stress  If you have diabetes, you may be "diet" controlled or on oral medications or insulin to control your diabetes. However, you may find that your blood glucose is higher than usual in the hospital whether you have diabetes or not. This is often referred to as "stress hyperglycemia." Stress can elevate your blood glucose. This happens because of hormones put out by the body during times  of stress. If stress has been the cause of your high blood glucose, it can be followed regularly by your caregiver. That way he/she can make sure your hyperglycemia does not continue to get worse or progress to diabetes. Steroids  Steroids are medications that act on the infection fighting system (immune system) to block inflammation or infection. One side effect can be a rise in blood glucose. Most people can produce enough extra insulin to allow for this rise, but for those who cannot, steroids make blood glucose levels go even higher. It is not unusual for steroid treatments to "uncover" diabetes that is developing. It is not always possible to determine if the hyperglycemia will go away after the steroids are stopped. A special blood test called an A1c is sometimes done to determine if your blood glucose was elevated before the steroids were started. SYMPTOMS  Thirsty.  Frequent urination.  Dry mouth.  Blurred vision.  Tired or fatigue.  Weakness.  Sleepy.  Tingling in feet or leg. DIAGNOSIS  Diagnosis is made by monitoring blood glucose in one or all of the following ways:  A1c test. This is a chemical found in your blood.  Fingerstick blood glucose monitoring.  Laboratory results. TREATMENT  First, knowing the cause of the hyperglycemia is important before the hyperglycemia can be treated. Treatment may include, but is not be limited to:  Education.  Change or adjustment in medications.  Change or adjustment in meal plan.  Treatment for an illness, infection, etc.  More frequent blood glucose monitoring.  Change in exercise plan.  Decreasing or stopping steroids.  Lifestyle  changes. HOME CARE INSTRUCTIONS   Test your blood glucose as directed.  Exercise regularly. Your caregiver will give you instructions about exercise. Pre-diabetes or diabetes which comes on with stress is helped by exercising.  Eat wholesome, balanced meals. Eat often and at regular,  fixed times. Your caregiver or nutritionist will give you a meal plan to guide your sugar intake.  Being at an ideal weight is important. If needed, losing as little as 10 to 15 pounds may help improve blood glucose levels. SEEK MEDICAL CARE IF:   You have questions about medicine, activity, or diet.  You continue to have symptoms (problems such as increased thirst, urination, or weight gain). SEEK IMMEDIATE MEDICAL CARE IF:   You are vomiting or have diarrhea.  Your breath smells fruity.  You are breathing faster or slower.  You are very sleepy or incoherent.  You have numbness, tingling, or pain in your feet or hands.  You have chest pain.  Your symptoms get worse even though you have been following your caregiver's orders.  If you have any other questions or concerns. Document Released: 05/02/2001 Document Revised: 01/29/2012 Document Reviewed: 03/04/2012 [facility] Patient Information 2014 [place], [place].  Pneumonia, Adult Pneumonia is an infection of the lungs.  CAUSES Pneumonia may be caused by bacteria or a virus. Usually, these infections are caused by breathing infectious particles into the lungs (respiratory tract). SYMPTOMS   Cough.  Fever.  Chest pain.  Increased rate of breathing.  Wheezing.  Mucus production. DIAGNOSIS  If you have the common symptoms of pneumonia, your caregiver will typically confirm the diagnosis with a chest X-ray. The X-ray will show an abnormality in the lung (pulmonary infiltrate) if you have pneumonia. Other tests of your blood, urine, or sputum may be done to find the specific cause of your pneumonia. Your caregiver may also do tests (blood gases or pulse oximetry) to see how well your lungs are working. TREATMENT  Some forms of pneumonia may be spread to other people when you cough or sneeze. You may be asked to wear a mask before and during your exam. Pneumonia that is caused by bacteria is treated with antibiotic medicine.  Pneumonia that is caused by the influenza virus may be treated with an antiviral medicine. Most other viral infections must run their course. These infections will not respond to antibiotics.  PREVENTION A pneumococcal shot (vaccine) is available to prevent a common bacterial cause of pneumonia. This is usually suggested for:  People over 50 years old.  Patients on chemotherapy.  People with chronic lung problems, such as bronchitis or emphysema.  People with immune system problems. If you are over 65 or have a high risk condition, you may receive the pneumococcal vaccine if you have not received it before. In some countries, a routine influenza vaccine is also recommended. This vaccine can help prevent some cases of pneumonia.You may be offered the influenza vaccine as part of your care. If you smoke, it is time to quit. You may receive instructions on how to stop smoking. Your caregiver can provide medicines and counseling to help you quit. HOME CARE INSTRUCTIONS   Cough suppressants may be used if you are losing too much rest. However, coughing protects you by clearing your lungs. You should avoid using cough suppressants if you can.  Your caregiver may have prescribed medicine if he or she thinks your pneumonia is caused by a bacteria or influenza. Finish your medicine even if you start to feel better.  Your  caregiver may also prescribe an expectorant. This loosens the mucus to be coughed up.  Only take over-the-counter or prescription medicines for pain, discomfort, or fever as directed by your caregiver.  Do not smoke. Smoking is a common cause of bronchitis and can contribute to pneumonia. If you are a smoker and continue to smoke, your cough may last several weeks after your pneumonia has cleared.  A cold steam vaporizer or humidifier in your room or home may help loosen mucus.  Coughing is often worse at night. Sleeping in a semi-upright position in a recliner or using a couple  pillows under your head will help with this.  Get rest as you feel it is needed. Your body will usually let you know when you need to rest. SEEK IMMEDIATE MEDICAL CARE IF:   Your illness becomes worse. This is especially true if you are elderly or weakened from any other disease.  You cannot control your cough with suppressants and are losing sleep.  You begin coughing up blood.  You develop pain which is getting worse or is uncontrolled with medicines.  You have a fever.  Any of the symptoms which initially brought you in for treatment are getting worse rather than better.  You develop shortness of breath or chest pain. MAKE SURE YOU:   Understand these instructions.  Will watch your condition.  Will get help right away if you are not doing well or get worse. Document Released: 11/06/2005 Document Revised: 01/29/2012 Document Reviewed: 01/26/2011 [facility] Patient Information 2014 [place], [place].

## 2013-11-26 NOTE — ED Provider Notes (Signed)
Medical screening examination/treatment/procedure(s) were performed by non-physician practitioner and as supervising physician I was immediately available for consultation/collaboration.  EKG Interpretation   None        Babette Relic, MD 11/26/13 [id number]

## 2013-11-26 NOTE — ED Notes (Signed)
Personally walked the second sample of urine to the lab at 2.45pm.

## 2013-12-05 ENCOUNTER — Ambulatory Visit: Payer: No Typology Code available for payment source | Attending: Internal Medicine | Admitting: Internal Medicine

## 2013-12-05 VITALS — BP 195/93 | HR 77 | Temp 98.4°F | Resp 16 | Ht 72.0 in | Wt 204.0 lb

## 2013-12-05 DIAGNOSIS — K3184 Gastroparesis: Secondary | ICD-10-CM

## 2013-12-05 DIAGNOSIS — E119 Type 2 diabetes mellitus without complications: Secondary | ICD-10-CM

## 2013-12-05 DIAGNOSIS — E1149 Type 2 diabetes mellitus with other diabetic neurological complication: Secondary | ICD-10-CM | POA: Insufficient documentation

## 2013-12-05 DIAGNOSIS — K219 Gastro-esophageal reflux disease without esophagitis: Secondary | ICD-10-CM

## 2013-12-05 DIAGNOSIS — E7800 Pure hypercholesterolemia, unspecified: Secondary | ICD-10-CM

## 2013-12-05 DIAGNOSIS — E1142 Type 2 diabetes mellitus with diabetic polyneuropathy: Secondary | ICD-10-CM | POA: Insufficient documentation

## 2013-12-05 DIAGNOSIS — E1165 Type 2 diabetes mellitus with hyperglycemia: Secondary | ICD-10-CM

## 2013-12-05 DIAGNOSIS — IMO0001 Reserved for inherently not codable concepts without codable children: Secondary | ICD-10-CM

## 2013-12-05 DIAGNOSIS — I10 Essential (primary) hypertension: Secondary | ICD-10-CM | POA: Insufficient documentation

## 2013-12-05 LAB — LIPID PANEL
Cholesterol: 173 mg/dL (ref 0–200)
HDL: 49 mg/dL (ref >39)
LDL Cholesterol: 91 mg/dL (ref 0–99)
Total CHOL/HDL Ratio: 3.5 Ratio
Triglycerides: 166 mg/dL — ABNORMAL HIGH (ref <150)
VLDL: 33 mg/dL (ref 0–40)

## 2013-12-05 LAB — BASIC METABOLIC PANEL
BUN: 15 mg/dL (ref 6–23)
CHLORIDE: 97 meq/L (ref 96–112)
CO2: 30 mEq/L (ref 19–32)
Calcium: 9.2 mg/dL (ref 8.4–10.5)
Creat: 0.86 mg/dL (ref 0.50–1.10)
GLUCOSE: 401 mg/dL — AB (ref 70–99)
POTASSIUM: 4.3 meq/L (ref 3.5–5.3)
Sodium: 137 mEq/L (ref 135–145)

## 2013-12-05 LAB — POCT GLYCOSYLATED HEMOGLOBIN (HGB A1C): Hemoglobin A1C: 13.5

## 2013-12-05 MED ORDER — PROMETHAZINE HCL 25 MG PO TABS: 25.0000 mg | ORAL_TABLET | Freq: Three times a day (TID) | ORAL | Status: DC | PRN

## 2013-12-05 MED ORDER — ALCOHOL SWABS PADS: 1.0000 | MEDICATED_PAD | Status: DC | PRN

## 2013-12-05 MED ORDER — GLUCOSE BLOOD VI STRP: ORAL_STRIP | Status: DC

## 2013-12-05 MED ORDER — INSULIN DETEMIR 100 UNIT/ML FLEXPEN: 25.0000 [IU] | PEN_INJECTOR | Freq: Every evening | SUBCUTANEOUS | Status: DC | PRN

## 2013-12-05 MED ORDER — GABAPENTIN 300 MG PO CAPS: 300.0000 mg | ORAL_CAPSULE | Freq: Three times a day (TID) | ORAL | Status: DC

## 2013-12-05 MED ORDER — BLOOD GLUCOSE-BP MONITOR DEVI: 1.0000 | Freq: Once | Status: DC

## 2013-12-05 MED ORDER — FREESTYLE LANCETS MISC: Status: DC

## 2013-12-05 MED ORDER — LORAZEPAM 0.5 MG PO TABS: 0.5000 mg | ORAL_TABLET | Freq: Two times a day (BID) | ORAL | Status: DC | PRN

## 2013-12-05 MED ORDER — METFORMIN HCL 500 MG PO TABS: 1000.0000 mg | ORAL_TABLET | Freq: Two times a day (BID) | ORAL | Status: DC

## 2013-12-05 MED ORDER — AMLODIPINE BESYLATE 5 MG PO TABS: 5.0000 mg | ORAL_TABLET | Freq: Every day | ORAL | Status: DC

## 2013-12-05 NOTE — Patient Instructions (Addendum)
Hypertension As your heart beats, it forces blood through your arteries. This force is your blood pressure. If the pressure is too high, it is called hypertension (HTN) or high blood pressure. HTN is dangerous because you may have it and not know it. High blood pressure may mean that your heart has to work harder to pump blood. Your arteries may be narrow or stiff. The extra work puts you at risk for heart disease, stroke, and other problems.  Blood pressure consists of two numbers, a higher number over a lower, 110/72, for example. It is stated as "110 over 72." The ideal is below 120 for the top number (systolic) and under 80 for the bottom (diastolic). Write down your blood pressure today. You should pay close attention to your blood pressure if you have certain conditions such as:  Heart failure.  Prior heart attack.  Diabetes  Chronic kidney disease.  Prior stroke.  Multiple risk factors for heart disease. To see if you have HTN, your blood pressure should be measured while you are seated with your arm held at the level of the heart. It should be measured at least twice. A one-time elevated blood pressure reading (especially in the Emergency Department) does not mean that you need treatment. There may be conditions in which the blood pressure is different between your right and left arms. It is important to see your caregiver soon for a recheck. Most people have essential hypertension which means that there is not a specific cause. This type of high blood pressure may be lowered by changing lifestyle factors such as:  Stress.  Smoking.  Lack of exercise.  Excessive weight.  Drug/tobacco/alcohol use.  Eating less salt. Most people do not have symptoms from high blood pressure until it has caused damage to the body. Effective treatment can often prevent, delay or reduce that damage. TREATMENT  When a cause has been identified, treatment for high blood pressure is directed at the  cause. There are a large number of medications to treat HTN. These fall into several categories, and your caregiver will help you select the medicines that are best for you. Medications may have side effects. You should review side effects with your caregiver. If your blood pressure stays high after you have made lifestyle changes or started on medicines,   Your medication(s) may need to be changed.  Other problems may need to be addressed.  Be certain you understand your prescriptions, and know how and when to take your medicine.  Be sure to follow up with your caregiver within the time frame advised (usually within two weeks) to have your blood pressure rechecked and to review your medications.  If you are taking more than one medicine to lower your blood pressure, make sure you know how and at what times they should be taken. Taking two medicines at the same time can result in blood pressure that is too low. SEEK IMMEDIATE MEDICAL CARE IF:  You develop a severe headache, blurred or changing vision, or confusion.  You have unusual weakness or numbness, or a faint feeling.  You have severe chest or abdominal pain, vomiting, or breathing problems. MAKE SURE YOU:   Understand these instructions.  Will watch your condition.  Will get help right away if you are not doing well or get worse. Document Released: 11/06/2005 Document Revised: 01/29/2012 Document Reviewed: 06/26/2008 [facility] Patient Information 2014 [place]. Blood Glucose Monitoring, Adult Monitoring your blood glucose (also know as blood sugar) helps you to  manage your diabetes. It also helps you and your health care provider monitor your diabetes and determine how well your treatment plan is working. WHY SHOULD YOU MONITOR YOUR BLOOD GLUCOSE?  It can help you understand how food, exercise, and medicine affect your blood glucose.  It allows you to know what your blood glucose is at any given moment. You can quickly  tell if you are having low blood glucose (hypoglycemia) or high blood glucose (hyperglycemia).  It can help you and your health care provider know how to adjust your medicines.  It can help you understand how to manage an illness or adjust medicine for exercise. WHEN SHOULD YOU TEST? Your health care provider will help you decide how often you should check your blood glucose. This may depend on the type of diabetes you have, your diabetes control, or the types of medicines you are taking. Be sure to write down all of your blood glucose readings so that this information can be reviewed with your health care provider. See below for examples of testing times that your health care provider may suggest. Type 1 Diabetes  Test 4 times a day if you are in good control, using an insulin pump, or perform multiple daily injections.  If your diabetes is not well-controlled or if you are sick, you may need to monitor more often.  It is a good idea to also monitor:  Before and after exercise.  Between meals and 2 hours after a meal.  Occasionally between 2:00 to 3:00 am. Type 2 Diabetes  It can vary with each person, but generally, if you are on insulin, test 4 times a day.  If you take medicines by mouth (orally), test 2 times a day.  If you are on a controlled diet, test once a day.  If your diabetes is not well controlled or if you are sick, you may need to monitor more often. HOW TO MONITOR YOUR BLOOD GLUCOSE Supplies Needed  Blood glucose meter.  Test strips for your meter. Each meter has its own strips. You must use the strips that go with your own meter.  A pricking needle (lancet).  A device that holds the lancet (lancing device).  A journal or log book to write down your results. Procedure  Wash your hands with soap and water. Alcohol is not preferred.  Prick the side of your finger (not the tip) with the lancet.  Gently milk the finger until a small drop of blood  appears.  Follow the instructions that come with your meter for inserting the test strip, applying blood to the strip, and using your blood glucose meter. Other Areas to Get Blood for Testing Some meters allow you to use other areas of your body (other than your finger) to test your blood. These areas are called alternative sites. The most common alternative sites are:  The forearm.  The thigh.  The back area of the lower leg.  The palm of the hand. The blood flow in these areas is slower. Therefore, the blood glucose values you get may be delayed, and the numbers are different from what you would get from your fingers. Do not use alternative sites if you think you are having hypoglycemia. Your reading will not be accurate. Always use a finger if you are having hypoglycemia. Also, if you cannot feel your lows (hypoglycemia unawareness), always use your fingers for your blood glucose checks. ADDITIONAL TIPS FOR GLUCOSE MONITORING  Do not reuse lancets.  Always carry  your supplies with you.  All blood glucose meters have a 24-hour "hotline" number to call if you have questions or need help.  Adjust (calibrate) your blood glucose meter with a control solution after finishing a few boxes of strips. BLOOD GLUCOSE RECORD KEEPING It is a good idea to keep a daily record or log of your blood glucose readings. Most glucose meters, if not all, keep your glucose records stored in the meter. Some meters come with the ability to download your records to your home computer. Keeping a record of your blood glucose readings is especially helpful if you are wanting to look for patterns. Make notes to go along with the blood glucose readings because you might forget what happened at that exact time. Keeping good records helps you and your health care provider to work together to achieve good diabetes management.  Document Released: 11/09/2003 Document Revised: 07/09/2013 Document Reviewed:  03/31/2013 [facility] Patient Information 2014 [place].

## 2013-12-05 NOTE — Progress Notes (Signed)
Patient ID: Katherine [name], female   DOB: 1956/06/14, 58 y.o.   MRN: [id number]  CC: new pt  HPI: 58 year old female with past medical history of hypertension, diabetes, diabetic neuropathy who presented to clinic to establish primary care physician. Patient reports feeling good at this morning. She does have neuropathy in her feet and usually hands but not consistently. She reports no abdominal pain but she does have occasional nausea and no vomiting. No chest pain or shortness of breath. No polyuria or polydipsia. No blurred vision.   Allergies  Allergen Reactions  . Other Swelling    Seaweed-Arms, hands and face swell    Past Medical History  Diagnosis Date  . Diabetes mellitus   . Hypertension   . Boil of buttock    Current Outpatient Prescriptions on File Prior to Visit  Medication Sig Dispense Refill  . azithromycin (ZITHROMAX Z-PAK) 250 MG tablet 2 po day one, then 1 daily x 4 days  6 tablet  0  . loratadine (CLARITIN) 10 MG tablet Take 10 mg by mouth as needed for allergies.      . [DISCONTINUED] promethazine (PHENERGAN) 25 MG tablet Take 25 mg by mouth every 6 (six) hours as needed. For nausea      . [DISCONTINUED] sitaGLIPtin (JANUVIA) 100 MG tablet Take 100 mg by mouth daily.       No current facility-administered medications on file prior to visit.   Family History  Problem Relation Age of Onset  . Diabetes Father   . Hypertension Father   . Cancer Maternal Grandmother   . Hypertension Maternal Grandfather    History   Social History  . Marital Status: Married    Spouse Name: N/A    Number of Children: N/A  . Years of Education: N/A   Occupational History  . Not on file.   Social History Main Topics  . Smoking status: Never Smoker   . Smokeless tobacco: Not on file  . Alcohol Use: No  . Drug Use: No  . Sexual Activity: Not on file   Other Topics Concern  . Not on file   Social History Narrative  . No narrative on file    Review of Systems   Constitutional: Negative for fever, chills, diaphoresis, activity change, appetite change and fatigue.  HENT: Negative for ear pain, nosebleeds, congestion, facial swelling, rhinorrhea, neck pain, neck stiffness and ear discharge.   Eyes: Negative for pain, discharge, redness, itching and visual disturbance.  Respiratory: Negative for cough, choking, chest tightness, shortness of breath, wheezing and stridor.   Cardiovascular: Negative for chest pain, palpitations and leg swelling.  Gastrointestinal: Negative for abdominal distention.  Genitourinary: Negative for dysuria, urgency, frequency, hematuria, flank pain, decreased urine volume, difficulty urinating and dyspareunia.  Musculoskeletal: Negative for back pain, joint swelling, arthralgias and gait problem.  Neurological: Negative for dizziness, tremors, seizures, syncope, facial asymmetry, speech difficulty, weakness, light-headedness, positive for numbness and headaches.  Hematological: Negative for adenopathy. Does not bruise/bleed easily.  Psychiatric/Behavioral: Negative for hallucinations, behavioral problems, confusion, dysphoric mood, decreased concentration and agitation.    Objective:   Filed Vitals:   12/05/13 1029  BP: 195/93  Pulse: 77  Temp: 98.4 F (36.9 C)  Resp: 16    Physical Exam  Constitutional: Appears well-developed and well-nourished. No distress.  HENT: Normocephalic. External right and left ear normal.   Eyes: Conjunctivae and EOM are normal. PERRLA, no scleral icterus.  Neck: Normal ROM. Neck supple. No JVD. No tracheal deviation. No thyromegaly.  CVS: RRR, S1/S2 +, no murmurs, no gallops, no carotid bruit.  Pulmonary: Effort and breath sounds normal, no stridor, rhonchi, wheezes, rales.  Abdominal: Soft. BS +,  no distension, tenderness, rebound or guarding.  Musculoskeletal: Normal range of motion. No edema and no tenderness.  Lymphadenopathy: No lymphadenopathy noted, cervical, inguinal. Neuro:  Alert. Normal reflexes, muscle tone coordination. No cranial nerve deficit. Skin: Skin is warm and dry. No rash noted. Not diaphoretic. No erythema. No pallor.  Psychiatric: Normal mood and affect. Behavior, judgment, thought content normal.   Lab Results  Component Value Date   WBC 3.9* 11/26/2013   HGB 14.7 11/26/2013   HCT 42.8 11/26/2013   MCV 82.5 11/26/2013   PLT 254 11/26/2013   Lab Results  Component Value Date   CREATININE 0.62 11/26/2013   BUN 13 11/26/2013   NA 139 11/26/2013   K 4.0 11/26/2013   CL 99 11/26/2013   CO2 27 11/26/2013    Lab Results  Component Value Date   HGBA1C 14.5* 12/23/2012   Lipid Panel     Component Value Date/Time   CHOL 198 12/23/2012 1100   TRIG 130 12/23/2012 1100   HDL 66 12/23/2012 1100   CHOLHDL 3.0 12/23/2012 1100   VLDL 26 12/23/2012 1100   LDLCALC 106* 12/23/2012 1100       Assessment and plan:   Patient Active Problem List   Diagnosis Date Noted  . HYPERCHOLESTEROLEMIA 05/19/2009    Priority: Medium - Not on cholesterol lowering medication  - Check lipid panel today   . GERD 02/15/2009    Priority: Medium - Takes Pepcid   . Gastroparesis 09/03/2008    Priority: Medium - Reports relatively stable, was prescribed Reglan but she really never took it. We have discussed proper diet, diabetic diet to try to avoid gastroparesis.   . ONYCHOMYCOSIS 07/14/2008    Priority: Medium - Patient given podiatry referral   . DIABETES MELLITUS, TYPE II, UNCONTROLLED 09/16/2003    Priority: Medium - Check A1c  - Continue taking combination of metformin and Levemir and patient instructed to come back in one week to recheck CBG.  - Podiatry referral, endocrinology referral and ophthalmology referral all provide   . HYPERTENSION, BENIGN ESSENTIAL 09/16/2003    Priority: Medium - We have discussed target BP range - I have advised pt to check BP regularly and to call us back if the numbers are higher than 140/90 - discussed the importance of compliance with medical  therapy and diet  - Patient was actually taking her husband's medications, Corag. I have instructed her not to take her husband's medications. Her systolic blood pressure is 190 this morning. We will start the new medication, Norvasc 5 mg. We will recheck blood pressure in one week and make further adjustments if needed during the next visit.   Marland Kitchen DEPRESSION - On low dose Ativan  09/10/2009

## 2013-12-05 NOTE — Progress Notes (Signed)
Pt is here to establish care. Pt is here to manage her diabetes. Pt has been having extreme pain in her right foot for 3 weeks

## 2013-12-06 LAB — TSH: TSH: 0.698 u[IU]/mL (ref 0.350–4.500)

## 2013-12-18 ENCOUNTER — Ambulatory Visit: Payer: Self-pay

## 2013-12-22 ENCOUNTER — Ambulatory Visit (INDEPENDENT_AMBULATORY_CARE_PROVIDER_SITE_OTHER): Payer: No Typology Code available for payment source | Admitting: Podiatry

## 2013-12-22 ENCOUNTER — Encounter: Payer: Self-pay | Admitting: *Deleted

## 2013-12-22 ENCOUNTER — Encounter: Payer: Self-pay | Admitting: Podiatry

## 2013-12-22 DIAGNOSIS — L03039 Cellulitis of unspecified toe: Secondary | ICD-10-CM

## 2013-12-22 MED ORDER — CEPHALEXIN 500 MG PO CAPS: 500.0000 mg | ORAL_CAPSULE | Freq: Four times a day (QID) | ORAL | Status: DC

## 2013-12-22 NOTE — Progress Notes (Signed)
   Subjective:    Patient ID: Katherine [name], female    DOB: 17-Dec-1955, 58 y.o.   MRN: [id number]  HPI ''RT FOOT TOES ARE SORE FOR 3 WEEKS AND ITS GETTING BETTER. TRIED NO TREATMENT.''    Review of Systems  Constitutional: Positive for appetite change and fatigue.  HENT: Positive for hearing loss.   Gastrointestinal: Positive for nausea.       Objective:   Physical Exam  Orientated x3 black female  Vascular: DP is are 4 over 4 bilaterally. PTs are two over four bilaterally. Feet are warm to slightly cool to touch bilaterally. There is no pallor on elevation or rubor in dependency bilaterally.  Neurological: Sensation to 10 g monofilament wire intact 64/10 right and 5/10 left. Vibratory sensation intact bilaterally.  Dermatological: The medial margin the second right toenail is mildly edematous, erythematous without any drainage noted. All the toenails have dystrophic changes, color changes and hypertrophy with maximum signs in the hallux nails bilaterally are  Musculoskeletal: No restriction in the ankle, subtalar joint, metatarsal joints bilaterally. Bilateral HAV deformities noted..        Assessment & Plan:   Assessment: Paronychia second right toe Diabetic peripheral neuropathy Onychomycoses Satisfactory vascular status  Plan: Cephalexin 500 mg by mouth 4 times a day x7 days prescribe. Patient advised to wear socks to keep the warm.  Reevaluate x2 weeks for response to antibiotic. In addition debrided of mycotic nails at the next visit.

## 2013-12-22 NOTE — Patient Instructions (Signed)
Take oral antibiotics 1   4 times a day x7 days. Apply triple antibiotic ointment to the second right toenail area daily and cover with Band-Aid. Wear socks on feet at all times.  Diabetes and Foot Care Diabetes may cause you to have problems because of poor blood supply (circulation) to your feet and legs. This may cause the skin on your feet to become thinner, break easier, and heal more slowly. Your skin may become dry, and the skin may peel and crack. You may also have nerve damage in your legs and feet causing decreased feeling in them. You may not notice minor injuries to your feet that could lead to infections or more serious problems. Taking care of your feet is one of the most important things you can do for yourself.  HOME CARE INSTRUCTIONS  Wear shoes at all times, even in the house. Do not go barefoot. Bare feet are easily injured.  Check your feet daily for blisters, cuts, and redness. If you cannot see the bottom of your feet, use a mirror or ask someone for help.  Wash your feet with warm water (do not use hot water) and mild soap. Then pat your feet and the areas between your toes until they are completely dry. Do not soak your feet as this can dry your skin.  Apply a moisturizing lotion or petroleum jelly (that does not contain alcohol and is unscented) to the skin on your feet and to dry, brittle toenails. Do not apply lotion between your toes.  Trim your toenails straight across. Do not dig under them or around the cuticle. File the edges of your nails with an emery board or nail file.  Do not cut corns or calluses or try to remove them with medicine.  Wear clean socks or stockings every day. Make sure they are not too tight. Do not wear knee-high stockings since they may decrease blood flow to your legs.  Wear shoes that fit properly and have enough cushioning. To break in new shoes, wear them for just a few hours a day. This prevents you from injuring your feet. Always look  in your shoes before you put them on to be sure there are no objects inside.  Do not cross your legs. This may decrease the blood flow to your feet.  If you find a minor scrape, cut, or break in the skin on your feet, keep it and the skin around it clean and dry. These areas may be cleansed with mild soap and water. Do not cleanse the area with peroxide, alcohol, or iodine.  When you remove an adhesive bandage, be sure not to damage the skin around it.  If you have a wound, look at it several times a day to make sure it is healing.  Do not use heating pads or hot water bottles. They may burn your skin. If you have lost feeling in your feet or legs, you may not know it is happening until it is too late.  Make sure your health care provider performs a complete foot exam at least annually or more often if you have foot problems. Report any cuts, sores, or bruises to your health care provider immediately. SEEK MEDICAL CARE IF:   You have an injury that is not healing.  You have cuts or breaks in the skin.  You have an ingrown nail.  You notice redness on your legs or feet.  You feel burning or tingling in your legs or feet.  You have pain or cramps in your legs and feet.  Your legs or feet are numb.  Your feet always feel cold. SEEK IMMEDIATE MEDICAL CARE IF:   There is increasing redness, swelling, or pain in or around a wound.  There is a red line that goes up your leg.  Pus is coming from a wound.  You develop a fever or as directed by your health care provider.  You notice a bad smell coming from an ulcer or wound. Document Released: 11/03/2000 Document Revised: 07/09/2013 Document Reviewed: 04/15/2013 [facility] Patient Information 2014 [place].

## 2013-12-23 ENCOUNTER — Encounter: Payer: Self-pay | Admitting: Podiatry

## 2013-12-24 ENCOUNTER — Encounter: Payer: Self-pay | Admitting: *Deleted

## 2013-12-26 ENCOUNTER — Ambulatory Visit: Payer: No Typology Code available for payment source | Attending: Internal Medicine | Admitting: Internal Medicine

## 2013-12-26 ENCOUNTER — Encounter: Payer: Self-pay | Admitting: Internal Medicine

## 2013-12-26 ENCOUNTER — Other Ambulatory Visit: Payer: Self-pay

## 2013-12-26 VITALS — BP 195/120 | HR 69 | Temp 98.3°F | Resp 16 | Ht 72.0 in | Wt 200.0 lb

## 2013-12-26 DIAGNOSIS — F329 Major depressive disorder, single episode, unspecified: Secondary | ICD-10-CM | POA: Insufficient documentation

## 2013-12-26 DIAGNOSIS — I10 Essential (primary) hypertension: Secondary | ICD-10-CM | POA: Insufficient documentation

## 2013-12-26 DIAGNOSIS — E7800 Pure hypercholesterolemia, unspecified: Secondary | ICD-10-CM | POA: Insufficient documentation

## 2013-12-26 DIAGNOSIS — Z833 Family history of diabetes mellitus: Secondary | ICD-10-CM | POA: Insufficient documentation

## 2013-12-26 DIAGNOSIS — E119 Type 2 diabetes mellitus without complications: Secondary | ICD-10-CM | POA: Insufficient documentation

## 2013-12-26 DIAGNOSIS — K219 Gastro-esophageal reflux disease without esophagitis: Secondary | ICD-10-CM | POA: Insufficient documentation

## 2013-12-26 DIAGNOSIS — Z79899 Other long term (current) drug therapy: Secondary | ICD-10-CM | POA: Insufficient documentation

## 2013-12-26 DIAGNOSIS — Z8249 Family history of ischemic heart disease and other diseases of the circulatory system: Secondary | ICD-10-CM | POA: Insufficient documentation

## 2013-12-26 DIAGNOSIS — F3289 Other specified depressive episodes: Secondary | ICD-10-CM | POA: Insufficient documentation

## 2013-12-26 LAB — CBC WITH DIFFERENTIAL/PLATELET
BASOS ABS: 0 10*3/uL (ref 0.0–0.1)
Basophils Relative: 0 % (ref 0–1)
EOS ABS: 0.2 10*3/uL (ref 0.0–0.7)
EOS PCT: 5 % (ref 0–5)
HCT: 44 % (ref 36.0–46.0)
Hemoglobin: 14.7 g/dL (ref 12.0–15.0)
Lymphocytes Relative: 30 % (ref 12–46)
Lymphs Abs: 1.5 10*3/uL (ref 0.7–4.0)
MCH: 28.8 pg (ref 26.0–34.0)
MCHC: 33.4 g/dL (ref 30.0–36.0)
MCV: 86.1 fL (ref 78.0–100.0)
Monocytes Absolute: 0.4 10*3/uL (ref 0.1–1.0)
Monocytes Relative: 9 % (ref 3–12)
Neutro Abs: 2.8 10*3/uL (ref 1.7–7.7)
Neutrophils Relative %: 56 % (ref 43–77)
PLATELETS: 155 10*3/uL (ref 150–400)
RBC: 5.11 MIL/uL (ref 3.87–5.11)
RDW: 13.5 % (ref 11.5–15.5)
WBC: 4.9 10*3/uL (ref 4.0–10.5)

## 2013-12-26 LAB — GLUCOSE, POCT (MANUAL RESULT ENTRY): POC Glucose: 325 mg/dl — AB (ref 70–99)

## 2013-12-26 MED ORDER — HYDROCHLOROTHIAZIDE 25 MG PO TABS: 25.0000 mg | ORAL_TABLET | Freq: Every day | ORAL | Status: DC

## 2013-12-26 MED ORDER — INSULIN DETEMIR 100 UNIT/ML FLEXPEN: 35.0000 [IU] | PEN_INJECTOR | Freq: Every evening | SUBCUTANEOUS | Status: DC | PRN

## 2013-12-26 MED ORDER — METFORMIN HCL 500 MG PO TABS: 1000.0000 mg | ORAL_TABLET | Freq: Two times a day (BID) | ORAL | Status: DC

## 2013-12-26 MED ORDER — AMLODIPINE BESYLATE 10 MG PO TABS: 10.0000 mg | ORAL_TABLET | Freq: Every day | ORAL | Status: DC

## 2013-12-26 MED ORDER — CLONIDINE HCL 0.1 MG PO TABS
0.2000 mg | ORAL_TABLET | Freq: Once | ORAL | Status: AC
  Administered 2013-12-26: 0.2 mg via ORAL

## 2013-12-26 MED ORDER — CLONIDINE HCL 0.1 MG PO TABS: 0.2000 mg | ORAL_TABLET | Freq: Once | ORAL | Status: DC

## 2013-12-26 MED ORDER — VALSARTAN-HYDROCHLOROTHIAZIDE 160-25 MG PO TABS: 1.0000 | ORAL_TABLET | Freq: Every day | ORAL | Status: DC

## 2013-12-26 MED ORDER — VALSARTAN 160 MG PO TABS: 160.0000 mg | ORAL_TABLET | Freq: Every day | ORAL | Status: DC

## 2013-12-26 MED ORDER — LISINOPRIL 40 MG PO TABS: 40.0000 mg | ORAL_TABLET | Freq: Every day | ORAL | Status: DC

## 2013-12-26 NOTE — Progress Notes (Signed)
Pt is here following up on her HTN and diabetes.

## 2013-12-26 NOTE — Addendum Note (Signed)
Addended by: Allyson Sabal MD, Ascencion Dike on: 12/26/2013 02:18 PM   Modules accepted: Orders

## 2013-12-26 NOTE — Addendum Note (Signed)
Addended by: Zael Shuman, [place] R on: 12/26/2013 04:22 PM   Modules accepted: Orders, Medications

## 2013-12-26 NOTE — Addendum Note (Signed)
Addended by: Luberta Mutter R on: 12/26/2013 12:50 PM   Modules accepted: Orders, Medications

## 2013-12-26 NOTE — Addendum Note (Signed)
Addended by: Allyson Sabal MD, Ascencion Dike on: 12/26/2013 04:11 PM   Modules accepted: Orders, Medications

## 2013-12-26 NOTE — Progress Notes (Signed)
Patient ID: Katherine [name], female   DOB: August 02, 1956, 58 y.o.   MRN: [id number]   CC:  HPI:  58 year old female with a history of hypertension, last seen by Dr. Charlies Silvers on 1/16. She has not taken any of her medications except metformin Lantus and still taking lisinopril 40 mg a day. She skipped her blood pressure medication this morning blood pressures in the 190s today. She has not filled her Norvasc prescription as she cannot afford it. She also states that she cannot afford any of her medications and has only $6 with her. Chest pain or shortness of breath or cardiopulmonary symptoms, last A1c was 13.5 and the patient has been taking her metformin and her Levemir. Despite that her sugars at home have been running between 250-300. She also complains of nausea which is constant without any episodes of vomiting.   Allergies  Allergen Reactions  . Other Swelling    Seaweed-Arms, hands and face swell    Past Medical History  Diagnosis Date  . Diabetes mellitus   . Hypertension   . Boil of buttock    Current Outpatient Prescriptions on File Prior to Visit  Medication Sig Dispense Refill  . Alcohol Swabs PADS 1 Package by Does not apply route as needed.  1 each  5  . Blood Glucose-BP Monitor (BLOOD GLUCOSE-WRIST BP MONITOR) DEVI 1 each by Does not apply route once.  1 each  0  . cephALEXin (KEFLEX) 500 MG capsule Take 1 capsule (500 mg total) by mouth 4 (four) times daily.  28 capsule  1  . gabapentin (NEURONTIN) 300 MG capsule Take 1 capsule (300 mg total) by mouth 3 (three) times daily.  90 capsule  5  . glucose blood (BL TEST STRIP PACK) test strip Use as instructed  100 each  5  . Lancets (FREESTYLE) lancets Use as instructed  100 each  5  . loratadine (CLARITIN) 10 MG tablet Take 10 mg by mouth as needed for allergies.      Marland Kitchen LORazepam (ATIVAN) 0.5 MG tablet Take 1 tablet (0.5 mg total) by mouth 2 (two) times daily as needed for anxiety or sleep.  30 tablet  5  . promethazine (PHENERGAN)  25 MG tablet Take 1 tablet (25 mg total) by mouth every 8 (eight) hours as needed for nausea or vomiting.  45 tablet  5  . [DISCONTINUED] sitaGLIPtin (JANUVIA) 100 MG tablet Take 100 mg by mouth daily.       No current facility-administered medications on file prior to visit.   Family History  Problem Relation Age of Onset  . Diabetes Father   . Hypertension Father   . Cancer Maternal Grandmother   . Hypertension Maternal Grandfather    History   Social History  . Marital Status: Married    Spouse Name: N/A    Number of Children: N/A  . Years of Education: N/A   Occupational History  . Not on file.   Social History Main Topics  . Smoking status: Never Smoker   . Smokeless tobacco: Not on file  . Alcohol Use: No  . Drug Use: No  . Sexual Activity: Not on file   Other Topics Concern  . Not on file   Social History Narrative  . No narrative on file    Review of Systems  Constitutional: Negative for fever, chills, diaphoresis, activity change, appetite change and fatigue.  HENT: As in history of present illness Eyes: Negative for pain, discharge, redness, itching and visual  disturbance.  Respiratory: Negative for cough, choking, chest tightness, shortness of breath, wheezing and stridor.   Cardiovascular: Negative for chest pain, palpitations and leg swelling.  Gastrointestinal: As in history of present illness  Genitourinary: Negative for dysuria, urgency, frequency, hematuria, flank pain, decreased urine volume, difficulty urinating and dyspareunia.  Musculoskeletal: Negative for back pain, joint swelling, arthralgias and gait problem.  Neurological: Negative for dizziness, tremors, seizures, syncope, facial asymmetry, speech difficulty, weakness, light-headedness, numbness and headaches.  Hematological: Negative for adenopathy. Does not bruise/bleed easily.  Psychiatric/Behavioral: Negative for hallucinations, behavioral problems, confusion, dysphoric mood, decreased  concentration and agitation.    Objective:   Filed Vitals:   12/26/13 1208  BP: 195/120  Pulse: 69  Temp: 98.3 F (36.8 C)  Resp: 16    Physical Exam  Constitutional: Appears well-developed and well-nourished. No distress.  HENT: Normocephalic. External right and left ear normal. Oropharynx is clear and moist.  Eyes: Conjunctivae and EOM are normal. PERRLA, no scleral icterus.  Neck: Normal ROM. Neck supple. No JVD. No tracheal deviation. No thyromegaly.  CVS: RRR, S1/S2 +, no murmurs, no gallops, no carotid bruit.  Pulmonary: Effort and breath sounds normal, no stridor, rhonchi, wheezes, rales.  Abdominal: Soft. BS +,  no distension, tenderness, rebound or guarding.  Musculoskeletal: Normal range of motion. No edema and no tenderness.  Lymphadenopathy: No lymphadenopathy noted, cervical, inguinal. Neuro: Alert. Normal reflexes, muscle tone coordination. No cranial nerve deficit. Skin: Skin is warm and dry. No rash noted. Not diaphoretic. No erythema. No pallor.  Psychiatric: Normal mood and affect. Behavior, judgment, thought content normal.   Lab Results  Component Value Date   WBC 3.9* 11/26/2013   HGB 14.7 11/26/2013   HCT 42.8 11/26/2013   MCV 82.5 11/26/2013   PLT 254 11/26/2013   Lab Results  Component Value Date   CREATININE 0.86 12/05/2013   BUN 15 12/05/2013   NA 137 12/05/2013   K 4.3 12/05/2013   CL 97 12/05/2013   CO2 30 12/05/2013    Lab Results  Component Value Date   HGBA1C 13.5 12/05/2013   Lipid Panel     Component Value Date/Time   CHOL 173 12/05/2013 1133   TRIG 166* 12/05/2013 1133   HDL 49 12/05/2013 1133   CHOLHDL 3.5 12/05/2013 1133   VLDL 33 12/05/2013 1133   LDLCALC 91 12/05/2013 1133       Assessment and plan:   Patient Active Problem List   Diagnosis Date Noted  . DEPRESSION 09/10/2009  . HYPERCHOLESTEROLEMIA 05/19/2009  . LEG CRAMPS 05/19/2009  . GERD 02/15/2009  . Gastroparesis 09/03/2008  . ONYCHOMYCOSIS 07/14/2008  . DENTAL CARIES  07/14/2008  . DIABETES MELLITUS, TYPE II, UNCONTROLLED 09/16/2003  . HYPERTENSION, BENIGN ESSENTIAL 09/16/2003   Diabetes Increase Levemir to 35 units Continue metformin    Hypertension Increase Norvasc to 10 mg Continue lisinopril 40 milligrams, add hydrochlorothiazide 25 mg  followup in 2 weeks for a blood pressure check Encouraged her to speak to her medication assistance specialist at our pharmacy to help her refill on her medications     The patient was given clear instructions to go to ER or return to medical center if symptoms don't improve, worsen or new problems develop. The patient verbalized understanding. The patient was told to call to get any lab results if not heard anything in the next week.

## 2014-01-05 ENCOUNTER — Encounter: Payer: Self-pay | Admitting: Podiatry

## 2014-01-05 ENCOUNTER — Ambulatory Visit (INDEPENDENT_AMBULATORY_CARE_PROVIDER_SITE_OTHER): Payer: No Typology Code available for payment source | Admitting: Podiatry

## 2014-01-05 ENCOUNTER — Ambulatory Visit: Payer: No Typology Code available for payment source | Admitting: Podiatry

## 2014-01-05 VITALS — BP 163/99 | HR 80 | Resp 18

## 2014-01-05 DIAGNOSIS — M79609 Pain in unspecified limb: Secondary | ICD-10-CM

## 2014-01-05 DIAGNOSIS — B351 Tinea unguium: Secondary | ICD-10-CM

## 2014-01-05 NOTE — Progress Notes (Signed)
Patient ID: Katherine [name], female   DOB: 06-Mar-1956, 58 y.o.   MRN: [id number]  Subjective: This patient presents requesting debridement of painful toenails. She is completing antibiotics prescribed on 12/22/2013 for a paronychia in the second right toe  Objective: The medial margin of the second right toenail demonstrates no or edema, edema or drainage. All toenails x10 are elongated, hypertrophic, discolored, brittle  Assessment: Symptomatic onychomycoses x10 Resolved paronychia second right toe  Plan: Nails x10 debrided back without a bleeding. Reappoint at three-month intervals for Debridement of symptomatic toenails

## 2014-01-05 NOTE — Patient Instructions (Signed)
Diabetes and Foot Care Diabetes may cause you to have problems because of poor blood supply (circulation) to your feet and legs. This may cause the skin on your feet to become thinner, break easier, and heal more slowly. Your skin may become dry, and the skin may peel and crack. You may also have nerve damage in your legs and feet causing decreased feeling in them. You may not notice minor injuries to your feet that could lead to infections or more serious problems. Taking care of your feet is one of the most important things you can do for yourself.  HOME CARE INSTRUCTIONS  Wear shoes at all times, even in the house. Do not go barefoot. Bare feet are easily injured.  Check your feet daily for blisters, cuts, and redness. If you cannot see the bottom of your feet, use a mirror or ask someone for help.  Wash your feet with warm water (do not use hot water) and mild soap. Then pat your feet and the areas between your toes until they are completely dry. Do not soak your feet as this can dry your skin.  Apply a moisturizing lotion or petroleum jelly (that does not contain alcohol and is unscented) to the skin on your feet and to dry, brittle toenails. Do not apply lotion between your toes.  Trim your toenails straight across. Do not dig under them or around the cuticle. File the edges of your nails with an emery board or nail file.  Do not cut corns or calluses or try to remove them with medicine.  Wear clean socks or stockings every day. Make sure they are not too tight. Do not wear knee-high stockings since they may decrease blood flow to your legs.  Wear shoes that fit properly and have enough cushioning. To break in new shoes, wear them for just a few hours a day. This prevents you from injuring your feet. Always look in your shoes before you put them on to be sure there are no objects inside.  Do not cross your legs. This may decrease the blood flow to your feet.  If you find a minor scrape,  cut, or break in the skin on your feet, keep it and the skin around it clean and dry. These areas may be cleansed with mild soap and water. Do not cleanse the area with peroxide, alcohol, or iodine.  When you remove an adhesive bandage, be sure not to damage the skin around it.  If you have a wound, look at it several times a day to make sure it is healing.  Do not use heating pads or hot water bottles. They may burn your skin. If you have lost feeling in your feet or legs, you may not know it is happening until it is too late.  Make sure your health care provider performs a complete foot exam at least annually or more often if you have foot problems. Report any cuts, sores, or bruises to your health care provider immediately. SEEK MEDICAL CARE IF:   You have an injury that is not healing.  You have cuts or breaks in the skin.  You have an ingrown nail.  You notice redness on your legs or feet.  You feel burning or tingling in your legs or feet.  You have pain or cramps in your legs and feet.  Your legs or feet are numb.  Your feet always feel cold. SEEK IMMEDIATE MEDICAL CARE IF:   There is increasing redness,   swelling, or pain in or around a wound.  There is a red line that goes up your leg.  Pus is coming from a wound.  You develop a fever or as directed by your health care provider.  You notice a bad smell coming from an ulcer or wound. Document Released: 11/03/2000 Document Revised: 07/09/2013 Document Reviewed: 04/15/2013 [employer] Patient Information 2014 [employer].  

## 2014-01-05 NOTE — Progress Notes (Signed)
° °  Subjective:    Patient ID: Katherine [name], female    DOB: Apr 11, 1956, 58 y.o.   MRN: [id number]  HPI    Review of Systems     Objective:   Physical Exam        Assessment & Plan:

## 2014-01-09 ENCOUNTER — Ambulatory Visit: Payer: No Typology Code available for payment source | Attending: Internal Medicine

## 2014-01-09 ENCOUNTER — Other Ambulatory Visit: Payer: Self-pay | Admitting: Internal Medicine

## 2014-01-09 MED ORDER — OLMESARTAN MEDOXOMIL-HCTZ 20-12.5 MG PO TABS: 1.0000 | ORAL_TABLET | Freq: Every day | ORAL | Status: DC

## 2014-01-09 MED ORDER — INSULIN DETEMIR 100 UNIT/ML FLEXPEN: 35.0000 [IU] | PEN_INJECTOR | Freq: Every evening | SUBCUTANEOUS | Status: DC | PRN

## 2014-01-09 NOTE — Progress Notes (Unsigned)
Pt is here for BP check only.

## 2014-01-12 ENCOUNTER — Other Ambulatory Visit: Payer: Self-pay | Admitting: *Deleted

## 2014-02-02 ENCOUNTER — Ambulatory Visit: Payer: No Typology Code available for payment source | Attending: Internal Medicine

## 2014-02-02 VITALS — BP 126/78 | HR 68 | Temp 98.0°F | Resp 17

## 2014-02-02 DIAGNOSIS — Z0000 Encounter for general adult medical examination without abnormal findings: Secondary | ICD-10-CM

## 2014-02-02 NOTE — Progress Notes (Unsigned)
Patient here for ppd placement

## 2014-02-04 ENCOUNTER — Ambulatory Visit: Payer: No Typology Code available for payment source | Attending: Internal Medicine

## 2014-03-09 ENCOUNTER — Ambulatory Visit: Payer: No Typology Code available for payment source | Admitting: Internal Medicine

## 2014-06-21 ENCOUNTER — Inpatient Hospital Stay (HOSPITAL_COMMUNITY)
Admission: EM | Admit: 2014-06-21 | Discharge: 2014-06-23 | DRG: 603 | Disposition: A | Discharge status: Home / Self Care | Payer: Self-pay | Attending: Internal Medicine | Admitting: Internal Medicine

## 2014-06-21 ENCOUNTER — Encounter (HOSPITAL_COMMUNITY): Payer: Self-pay | Admitting: Emergency Medicine

## 2014-06-21 DIAGNOSIS — K219 Gastro-esophageal reflux disease without esophagitis: Secondary | ICD-10-CM | POA: Diagnosis present

## 2014-06-21 DIAGNOSIS — K029 Dental caries, unspecified: Secondary | ICD-10-CM

## 2014-06-21 DIAGNOSIS — E7800 Pure hypercholesterolemia, unspecified: Secondary | ICD-10-CM

## 2014-06-21 DIAGNOSIS — E1165 Type 2 diabetes mellitus with hyperglycemia: Secondary | ICD-10-CM | POA: Diagnosis present

## 2014-06-21 DIAGNOSIS — E669 Obesity, unspecified: Secondary | ICD-10-CM | POA: Diagnosis present

## 2014-06-21 DIAGNOSIS — IMO0002 Reserved for concepts with insufficient information to code with codable children: Secondary | ICD-10-CM

## 2014-06-21 DIAGNOSIS — E1151 Type 2 diabetes mellitus with diabetic peripheral angiopathy without gangrene: Secondary | ICD-10-CM

## 2014-06-21 DIAGNOSIS — G609 Hereditary and idiopathic neuropathy, unspecified: Secondary | ICD-10-CM | POA: Diagnosis present

## 2014-06-21 DIAGNOSIS — L02619 Cutaneous abscess of unspecified foot: Principal | ICD-10-CM | POA: Diagnosis present

## 2014-06-21 DIAGNOSIS — Z6827 Body mass index (BMI) 27.0-27.9, adult: Secondary | ICD-10-CM

## 2014-06-21 DIAGNOSIS — Z794 Long term (current) use of insulin: Secondary | ICD-10-CM

## 2014-06-21 DIAGNOSIS — E118 Type 2 diabetes mellitus with unspecified complications: Secondary | ICD-10-CM

## 2014-06-21 DIAGNOSIS — L03032 Cellulitis of left toe: Secondary | ICD-10-CM

## 2014-06-21 DIAGNOSIS — F329 Major depressive disorder, single episode, unspecified: Secondary | ICD-10-CM

## 2014-06-21 DIAGNOSIS — R252 Cramp and spasm: Secondary | ICD-10-CM

## 2014-06-21 DIAGNOSIS — L03039 Cellulitis of unspecified toe: Principal | ICD-10-CM | POA: Diagnosis present

## 2014-06-21 DIAGNOSIS — I10 Essential (primary) hypertension: Secondary | ICD-10-CM

## 2014-06-21 DIAGNOSIS — Z9089 Acquired absence of other organs: Secondary | ICD-10-CM

## 2014-06-21 DIAGNOSIS — B351 Tinea unguium: Secondary | ICD-10-CM

## 2014-06-21 DIAGNOSIS — Z833 Family history of diabetes mellitus: Secondary | ICD-10-CM

## 2014-06-21 DIAGNOSIS — IMO0001 Reserved for inherently not codable concepts without codable children: Secondary | ICD-10-CM

## 2014-06-21 DIAGNOSIS — L03031 Cellulitis of right toe: Secondary | ICD-10-CM

## 2014-06-21 DIAGNOSIS — F3289 Other specified depressive episodes: Secondary | ICD-10-CM

## 2014-06-21 DIAGNOSIS — Z79899 Other long term (current) drug therapy: Secondary | ICD-10-CM

## 2014-06-21 DIAGNOSIS — Z8249 Family history of ischemic heart disease and other diseases of the circulatory system: Secondary | ICD-10-CM

## 2014-06-21 DIAGNOSIS — K3184 Gastroparesis: Secondary | ICD-10-CM

## 2014-06-21 LAB — BASIC METABOLIC PANEL
Anion gap: 14 (ref 5–15)
BUN: 8 mg/dL (ref 6–23)
CHLORIDE: 94 meq/L — AB (ref 96–112)
CO2: 28 meq/L (ref 19–32)
Calcium: 8.8 mg/dL (ref 8.4–10.5)
Creatinine, Ser: 0.61 mg/dL (ref 0.50–1.10)
GFR calc Af Amer: >90 mL/min (ref >90)
GFR calc non Af Amer: >90 mL/min (ref >90)
Glucose, Bld: 515 mg/dL — ABNORMAL HIGH (ref 70–99)
Potassium: 3.6 mEq/L — ABNORMAL LOW (ref 3.7–5.3)
Sodium: 136 mEq/L — ABNORMAL LOW (ref 137–147)

## 2014-06-21 LAB — CBC WITH DIFFERENTIAL/PLATELET
BASOS ABS: 0 10*3/uL (ref 0.0–0.1)
Basophils Relative: 0 % (ref 0–1)
Eosinophils Absolute: 0.2 10*3/uL (ref 0.0–0.7)
Eosinophils Relative: 3 % (ref 0–5)
HCT: 39.6 % (ref 36.0–46.0)
Hemoglobin: 13.5 g/dL (ref 12.0–15.0)
Lymphocytes Relative: 40 % (ref 12–46)
Lymphs Abs: 2.7 10*3/uL (ref 0.7–4.0)
MCH: 28.9 pg (ref 26.0–34.0)
MCHC: 34.1 g/dL (ref 30.0–36.0)
MCV: 84.8 fL (ref 78.0–100.0)
Monocytes Absolute: 0.4 10*3/uL (ref 0.1–1.0)
Monocytes Relative: 6 % (ref 3–12)
Neutro Abs: 3.4 10*3/uL (ref 1.7–7.7)
Neutrophils Relative %: 51 % (ref 43–77)
PLATELETS: 211 10*3/uL (ref 150–400)
RBC: 4.67 MIL/uL (ref 3.87–5.11)
RDW: 12.6 % (ref 11.5–15.5)
WBC: 6.7 10*3/uL (ref 4.0–10.5)

## 2014-06-21 LAB — URIC ACID: Uric Acid, Serum: 4.2 mg/dL (ref 2.4–7.0)

## 2014-06-21 LAB — I-STAT CG4 LACTIC ACID, ED: LACTIC ACID, VENOUS: 3.56 mmol/L — AB (ref 0.5–2.2)

## 2014-06-21 MED ORDER — SODIUM CHLORIDE 0.9 % IV BOLUS (SEPSIS)
1000.0000 mL | Freq: Once | INTRAVENOUS | Status: AC
  Administered 2014-06-22: 1000 mL via INTRAVENOUS

## 2014-06-21 MED ORDER — VANCOMYCIN HCL IN DEXTROSE 1-5 GM/200ML-% IV SOLN
1000.0000 mg | Freq: Once | INTRAVENOUS | Status: AC
  Administered 2014-06-22: 1000 mg via INTRAVENOUS
  Filled 2014-06-21: qty 200

## 2014-06-21 MED ORDER — OXYCODONE-ACETAMINOPHEN 5-325 MG PO TABS
1.0000 | ORAL_TABLET | Freq: Once | ORAL | Status: AC
  Administered 2014-06-21: 1 via ORAL
  Filled 2014-06-21: qty 1

## 2014-06-21 MED ORDER — GABAPENTIN 300 MG PO CAPS
300.0000 mg | ORAL_CAPSULE | Freq: Once | ORAL | Status: AC
  Administered 2014-06-22: 300 mg via ORAL
  Filled 2014-06-21: qty 1

## 2014-06-21 MED ORDER — PIPERACILLIN-TAZOBACTAM 3.375 G IVPB 30 MIN
3.3750 g | Freq: Once | INTRAVENOUS | Status: AC
Start: 2014-06-21 — End: 2014-06-22
  Administered 2014-06-22: 3.375 g via INTRAVENOUS
  Filled 2014-06-21: qty 50

## 2014-06-21 MED ORDER — INSULIN ASPART 100 UNIT/ML ~~LOC~~ SOLN
8.0000 [IU] | Freq: Once | SUBCUTANEOUS | Status: AC
  Administered 2014-06-22: 8 [IU] via SUBCUTANEOUS
  Filled 2014-06-21: qty 1

## 2014-06-21 NOTE — ED Notes (Signed)
Foot pain bilaterally began at approx 2pm today worsening as the day went on.  Able to bear weight but extremely painful.

## 2014-06-21 NOTE — ED Notes (Signed)
Pt reports pain to both feet specifically to the big toes. Pt with hx of diabetes. R big toe is red and purple and swollen. Pt noticed this 1 hour ago. No open wounds noted. Pt denies fevers/chills.

## 2014-06-21 NOTE — ED Notes (Signed)
I Stat Lactic Acid results shown to T. Kirichenko PA, and Dr.  Chauncey Cruel. Rancour

## 2014-06-21 NOTE — ED Provider Notes (Signed)
CSN: [id number]     Arrival date & time 06/21/14  1829 History   First MD Initiated Contact with Patient 06/21/14 2210     Chief Complaint  Patient presents with  . Foot Pain     (Consider location/radiation/quality/duration/timing/severity/associated sxs/prior Treatment) HPI Katherine [name] is a 58 y.o. female who presents to emergency department complaining of bilateral foot pain. Patient states her pain began several weeks ago however states the swelling and redness in bilateral great toes began today. She states is unable to touch her feet or her toes due to severe pain. States she is ambulatory but has to limp or walk on her heels. She states she has pain with movement of her toes. She denies any injuries. She denies any fever, chills. She states she had some Norco left over and she took that with no relief. She states she is diabetic. She denies history of foot pain similar to this the past.  Past Medical History  Diagnosis Date  . Diabetes mellitus   . Hypertension   . Boil of buttock    Past Surgical History  Procedure Laterality Date  . Appendectomy     Family History  Problem Relation Age of Onset  . Diabetes Father   . Hypertension Father   . Cancer Maternal Grandmother   . Hypertension Maternal Grandfather    History  Substance Use Topics  . Smoking status: Never Smoker   . Smokeless tobacco: Not on file  . Alcohol Use: No   OB History   Grav Para Term Preterm Abortions TAB SAB Ect Mult Living                 Review of Systems  Constitutional: Negative for fever and chills.  Respiratory: Negative for cough, chest tightness and shortness of breath.   Cardiovascular: Negative for chest pain, palpitations and leg swelling.  Gastrointestinal: Negative for nausea, vomiting, abdominal pain and diarrhea.  Musculoskeletal: Positive for arthralgias, joint swelling and myalgias. Negative for neck pain and neck stiffness.  Skin: Positive for color change. Negative for  rash.  Neurological: Negative for dizziness, weakness and headaches.  All other systems reviewed and are negative.     Allergies  Other  Home Medications   Prior to Admission medications   Medication Sig Start Date End Date Taking? Authorizing Provider  Alcohol Swabs PADS 1 Package by Does not apply route as needed. 12/05/13   Robbie Lis, MD  amLODipine (NORVASC) 10 MG tablet Take 1 tablet (10 mg total) by mouth daily. 12/26/13   Reyne Dumas, MD  Blood Glucose-BP Monitor (BLOOD GLUCOSE-WRIST BP MONITOR) DEVI 1 each by Does not apply route once. 12/05/13   Robbie Lis, MD  glucose blood (BL TEST STRIP PACK) test strip Use as instructed 12/05/13   Robbie Lis, MD  Insulin Detemir (LEVEMIR FLEXPEN) 100 UNIT/ML Pen Inject 35 Units into the skin at bedtime as needed (when sugar is higher than 200). 01/09/14   Angelica Chessman, MD  Lancets (FREESTYLE) lancets Use as instructed 12/05/13   Robbie Lis, MD  metFORMIN (GLUCOPHAGE) 500 MG tablet Take 2 tablets (1,000 mg total) by mouth 2 (two) times daily with a meal. 12/26/13   Reyne Dumas, MD  olmesartan-hydrochlorothiazide (BENICAR HCT) 20-12.5 MG per tablet Take 1 tablet by mouth daily. 01/09/14   Angelica Chessman, MD  promethazine (PHENERGAN) 25 MG tablet Take 1 tablet (25 mg total) by mouth every 8 (eight) hours as needed for nausea or vomiting. 12/05/13  Robbie Lis, MD  valsartan-hydrochlorothiazide (DIOVAN HCT) 160-25 MG per tablet Take 1 tablet by mouth daily. 12/26/13   Reyne Dumas, MD   BP 163/66  Pulse 73  Temp(Src) 98.4 F (36.9 C) (Oral)  Resp 20  Ht 6' (1.829 m)  Wt 205 lb (92.987 kg)  BMI 27.80 kg/m2  SpO2 99% Physical Exam  Nursing note and vitals reviewed. Constitutional: She appears well-developed and well-nourished. No distress.  HENT:  Head: Normocephalic.  Eyes: Conjunctivae are normal.  Neck: Neck supple.  Cardiovascular: Normal rate, regular rhythm and normal heart sounds.   Pulmonary/Chest: Effort normal  and breath sounds normal. No respiratory distress. She has no wheezes. She has no rales.  Abdominal: Soft. Bowel sounds are normal. She exhibits no distension. There is no tenderness. There is no rebound.  Musculoskeletal: She exhibits no edema.  Swelling noted to bilateral feet with no discoloration of the skin. Right great toe and 2nd toe erythematous. Fungal nail infection present. Tenderness to palpation over all toes and dorsal foot bilaterally even to the light touch. Dorsal pedal pulses intact.   Neurological: She is alert.  Skin: Skin is warm and dry.  Psychiatric: She has a normal mood and affect. Her behavior is normal.    ED Course  Procedures (including critical care time) Labs Review Labs Reviewed  BASIC METABOLIC PANEL - Abnormal; Notable for the following:    Sodium 136 (*)    Potassium 3.6 (*)    Chloride 94 (*)    Glucose, Bld 515 (*)    All other components within normal limits  I-STAT CG4 LACTIC ACID, ED - Abnormal; Notable for the following:    Lactic Acid, Venous 3.56 (*)    All other components within normal limits  CBG MONITORING, ED - Abnormal; Notable for the following:    Glucose-Capillary 394 (*)    All other components within normal limits  URINE CULTURE  CBC WITH DIFFERENTIAL  URIC ACID  URINALYSIS, ROUTINE W REFLEX MICROSCOPIC    Imaging Review Dg Chest 2 View  06/22/2014   CLINICAL DATA:  Foot pain.  History of hypertension.  EXAM: CHEST  2 VIEW  COMPARISON:  11/26/2013  FINDINGS: Normal heart size and mediastinal contours. No acute infiltrate or edema. No effusion or pneumothorax. No acute osseous findings.  IMPRESSION: No active cardiopulmonary disease.   Electronically Signed   By: Jorje Guild M.D.   On: 06/22/2014 00:35   Dg Foot Complete Right  06/22/2014   CLINICAL DATA:  Bilateral foot pain.  Diabetes  EXAM: RIGHT FOOT COMPLETE - 3+ VIEW  COMPARISON:  None.  FINDINGS: Negative for fracture.  Negative for osteomyelitis.  Pes planus with  degenerative changes in the midfoot.  IMPRESSION: Midfoot degenerative change.  No acute abnormality.   Electronically Signed   By: Franchot Gallo M.D.   On: 06/22/2014 00:36     EKG Interpretation None      MDM   Final diagnoses:  Cellulitis of great toe, right  Cellulitis of second toe, right    Patient with right foot pain, swelling and redness noted over right great toe and second toe. Suspect possible cellulitis with maybe infection underneath the toenails. Will get labs, x-rays, pain medications ordered.  Patient's labs showed elevated lactic acid of 2.56. She also has elevated glucose of 515, anion gap is normal at 14. IV fluids started, 8 units of subcutaneous insulin given. Given elevated lactic acid, will start patient on antibiotics for her toe. Patient also  seen by Dr. Randal Buba, agrees with assessment.   1:40 AM  Spoke with [facility] hospitalist will admit patient.  Filed Vitals:   06/21/14 2230 06/21/14 2245 06/21/14 2330 06/21/14 2333  BP: 169/86 185/83 190/74 190/74  Pulse:    70  Temp:      TempSrc:      Resp: 24 16 13 18   Height:      Weight:      SpO2:    95%        Paiten Boies A Misti Towle, PA-C 06/22/14 0140

## 2014-06-22 ENCOUNTER — Emergency Department (HOSPITAL_COMMUNITY): Payer: Self-pay

## 2014-06-22 DIAGNOSIS — I10 Essential (primary) hypertension: Secondary | ICD-10-CM

## 2014-06-22 DIAGNOSIS — L03039 Cellulitis of unspecified toe: Principal | ICD-10-CM

## 2014-06-22 DIAGNOSIS — L03031 Cellulitis of right toe: Secondary | ICD-10-CM | POA: Diagnosis present

## 2014-06-22 DIAGNOSIS — E7800 Pure hypercholesterolemia, unspecified: Secondary | ICD-10-CM

## 2014-06-22 DIAGNOSIS — L0390 Cellulitis, unspecified: Secondary | ICD-10-CM | POA: Insufficient documentation

## 2014-06-22 DIAGNOSIS — B351 Tinea unguium: Secondary | ICD-10-CM

## 2014-06-22 DIAGNOSIS — L02619 Cutaneous abscess of unspecified foot: Principal | ICD-10-CM

## 2014-06-22 DIAGNOSIS — L03032 Cellulitis of left toe: Secondary | ICD-10-CM | POA: Diagnosis present

## 2014-06-22 DIAGNOSIS — E1165 Type 2 diabetes mellitus with hyperglycemia: Secondary | ICD-10-CM

## 2014-06-22 DIAGNOSIS — IMO0001 Reserved for inherently not codable concepts without codable children: Secondary | ICD-10-CM

## 2014-06-22 LAB — GLUCOSE, CAPILLARY
GLUCOSE-CAPILLARY: 266 mg/dL — AB (ref 70–99)
GLUCOSE-CAPILLARY: 327 mg/dL — AB (ref 70–99)
GLUCOSE-CAPILLARY: 444 mg/dL — AB (ref 70–99)
Glucose-Capillary: 242 mg/dL — ABNORMAL HIGH (ref 70–99)
Glucose-Capillary: 333 mg/dL — ABNORMAL HIGH (ref 70–99)
Glucose-Capillary: 401 mg/dL — ABNORMAL HIGH (ref 70–99)

## 2014-06-22 LAB — BASIC METABOLIC PANEL
ANION GAP: 11 (ref 5–15)
BUN: 7 mg/dL (ref 6–23)
CHLORIDE: 100 meq/L (ref 96–112)
CO2: 30 meq/L (ref 19–32)
CREATININE: 0.54 mg/dL (ref 0.50–1.10)
Calcium: 8.2 mg/dL — ABNORMAL LOW (ref 8.4–10.5)
GFR calc non Af Amer: >90 mL/min (ref >90)
Glucose, Bld: 252 mg/dL — ABNORMAL HIGH (ref 70–99)
POTASSIUM: 2.9 meq/L — AB (ref 3.7–5.3)
Sodium: 141 mEq/L (ref 137–147)

## 2014-06-22 LAB — URINE MICROSCOPIC-ADD ON

## 2014-06-22 LAB — CBG MONITORING, ED: GLUCOSE-CAPILLARY: 394 mg/dL — AB (ref 70–99)

## 2014-06-22 LAB — HEMOGLOBIN A1C
HEMOGLOBIN A1C: 14.7 % — AB (ref <5.7)
Mean Plasma Glucose: 375 mg/dL — ABNORMAL HIGH (ref <117)

## 2014-06-22 LAB — URINALYSIS, ROUTINE W REFLEX MICROSCOPIC
BILIRUBIN URINE: NEGATIVE
Glucose, UA: >1000 mg/dL — AB
Hgb urine dipstick: NEGATIVE
Ketones, ur: NEGATIVE mg/dL
Leukocytes, UA: NEGATIVE
NITRITE: NEGATIVE
PH: 7 (ref 5.0–8.0)
Protein, ur: NEGATIVE mg/dL
Specific Gravity, Urine: 1.019 (ref 1.005–1.030)
Urobilinogen, UA: 1 mg/dL (ref 0.0–1.0)

## 2014-06-22 LAB — CBC
HCT: 39.1 % (ref 36.0–46.0)
Hemoglobin: 13 g/dL (ref 12.0–15.0)
MCH: 28.3 pg (ref 26.0–34.0)
MCHC: 33.2 g/dL (ref 30.0–36.0)
MCV: 85.2 fL (ref 78.0–100.0)
Platelets: 197 10*3/uL (ref 150–400)
RBC: 4.59 MIL/uL (ref 3.87–5.11)
RDW: 12.7 % (ref 11.5–15.5)
WBC: 5.4 10*3/uL (ref 4.0–10.5)

## 2014-06-22 LAB — MAGNESIUM: MAGNESIUM: 2 mg/dL (ref 1.5–2.5)

## 2014-06-22 MED ORDER — GABAPENTIN 300 MG PO CAPS
300.0000 mg | ORAL_CAPSULE | Freq: Every day | ORAL | Status: DC
  Administered 2014-06-22: 300 mg via ORAL
  Filled 2014-06-22 (×2): qty 1

## 2014-06-22 MED ORDER — HYDROCHLOROTHIAZIDE 12.5 MG PO CAPS
12.5000 mg | ORAL_CAPSULE | Freq: Every day | ORAL | Status: DC
  Administered 2014-06-22 – 2014-06-23 (×2): 12.5 mg via ORAL
  Filled 2014-06-22 (×2): qty 1

## 2014-06-22 MED ORDER — INSULIN ASPART 100 UNIT/ML ~~LOC~~ SOLN
0.0000 [IU] | Freq: Every day | SUBCUTANEOUS | Status: DC
  Administered 2014-06-22: 5 [IU] via SUBCUTANEOUS

## 2014-06-22 MED ORDER — OLMESARTAN MEDOXOMIL-HCTZ 20-12.5 MG PO TABS: 1.0000 | ORAL_TABLET | Freq: Every day | ORAL | Status: DC

## 2014-06-22 MED ORDER — INSULIN DETEMIR 100 UNIT/ML ~~LOC~~ SOLN: 30.0000 [IU] | Freq: Every evening | SUBCUTANEOUS | Status: DC | PRN

## 2014-06-22 MED ORDER — ONDANSETRON HCL 4 MG PO TABS
4.0000 mg | ORAL_TABLET | Freq: Four times a day (QID) | ORAL | Status: DC | PRN
  Administered 2014-06-22: 4 mg via ORAL
  Filled 2014-06-22: qty 1

## 2014-06-22 MED ORDER — HYDROMORPHONE HCL PF 1 MG/ML IJ SOLN: 0.5000 mg | INTRAMUSCULAR | Status: DC | PRN

## 2014-06-22 MED ORDER — IRBESARTAN 150 MG PO TABS
150.0000 mg | ORAL_TABLET | Freq: Every day | ORAL | Status: DC
  Administered 2014-06-22 – 2014-06-23 (×2): 150 mg via ORAL
  Filled 2014-06-22 (×2): qty 1

## 2014-06-22 MED ORDER — ACETAMINOPHEN 650 MG RE SUPP: 650.0000 mg | Freq: Four times a day (QID) | RECTAL | Status: DC | PRN

## 2014-06-22 MED ORDER — AMLODIPINE BESYLATE 10 MG PO TABS
10.0000 mg | ORAL_TABLET | Freq: Every day | ORAL | Status: DC
  Administered 2014-06-22 – 2014-06-23 (×2): 10 mg via ORAL
  Filled 2014-06-22 (×2): qty 1

## 2014-06-22 MED ORDER — SODIUM CHLORIDE 0.9 % IV BOLUS (SEPSIS)
1000.0000 mL | Freq: Once | INTRAVENOUS | Status: AC
  Administered 2014-06-22: 1000 mL via INTRAVENOUS

## 2014-06-22 MED ORDER — SODIUM CHLORIDE 0.9 % IV SOLN: INTRAVENOUS | Status: DC

## 2014-06-22 MED ORDER — ONDANSETRON HCL 4 MG/2ML IJ SOLN: 4.0000 mg | Freq: Four times a day (QID) | INTRAMUSCULAR | Status: DC | PRN

## 2014-06-22 MED ORDER — ALUM & MAG HYDROXIDE-SIMETH 200-200-20 MG/5ML PO SUSP: 30.0000 mL | Freq: Four times a day (QID) | ORAL | Status: DC | PRN

## 2014-06-22 MED ORDER — FLUCONAZOLE IN SODIUM CHLORIDE 200-0.9 MG/100ML-% IV SOLN
200.0000 mg | INTRAVENOUS | Status: DC
  Administered 2014-06-22 – 2014-06-23 (×2): 200 mg via INTRAVENOUS
  Filled 2014-06-22 (×3): qty 100

## 2014-06-22 MED ORDER — ACETAMINOPHEN 325 MG PO TABS: 650.0000 mg | ORAL_TABLET | Freq: Four times a day (QID) | ORAL | Status: DC | PRN

## 2014-06-22 MED ORDER — INSULIN ASPART 100 UNIT/ML ~~LOC~~ SOLN
3.0000 [IU] | Freq: Once | SUBCUTANEOUS | Status: AC
  Administered 2014-06-22: 3 [IU] via SUBCUTANEOUS

## 2014-06-22 MED ORDER — INSULIN DETEMIR 100 UNIT/ML ~~LOC~~ SOLN
30.0000 [IU] | Freq: Every day | SUBCUTANEOUS | Status: DC
  Administered 2014-06-22: 30 [IU] via SUBCUTANEOUS
  Filled 2014-06-22: qty 0.3

## 2014-06-22 MED ORDER — OXYCODONE HCL 5 MG PO TABS: 5.0000 mg | ORAL_TABLET | ORAL | Status: DC | PRN

## 2014-06-22 MED ORDER — VANCOMYCIN HCL 10 G IV SOLR
1250.0000 mg | Freq: Two times a day (BID) | INTRAVENOUS | Status: DC
  Administered 2014-06-22 – 2014-06-23 (×3): 1250 mg via INTRAVENOUS
  Filled 2014-06-22 (×4): qty 1250

## 2014-06-22 MED ORDER — INSULIN ASPART 100 UNIT/ML ~~LOC~~ SOLN
0.0000 [IU] | Freq: Three times a day (TID) | SUBCUTANEOUS | Status: DC
  Administered 2014-06-22: 7 [IU] via SUBCUTANEOUS
  Administered 2014-06-22: 3 [IU] via SUBCUTANEOUS
  Administered 2014-06-22: 7 [IU] via SUBCUTANEOUS
  Administered 2014-06-23: 5 [IU] via SUBCUTANEOUS
  Administered 2014-06-23: 1 [IU] via SUBCUTANEOUS
  Administered 2014-06-23: 7 [IU] via SUBCUTANEOUS

## 2014-06-22 MED ORDER — PIPERACILLIN-TAZOBACTAM 3.375 G IVPB
3.3750 g | Freq: Three times a day (TID) | INTRAVENOUS | Status: DC
  Administered 2014-06-22 – 2014-06-23 (×5): 3.375 g via INTRAVENOUS
  Filled 2014-06-22 (×8): qty 50

## 2014-06-22 MED ORDER — ENOXAPARIN SODIUM 30 MG/0.3ML ~~LOC~~ SOLN
30.0000 mg | SUBCUTANEOUS | Status: DC
  Filled 2014-06-22: qty 0.3

## 2014-06-22 MED ORDER — ENOXAPARIN SODIUM 40 MG/0.4ML ~~LOC~~ SOLN
40.0000 mg | SUBCUTANEOUS | Status: DC
  Administered 2014-06-22: 40 mg via SUBCUTANEOUS
  Filled 2014-06-22 (×2): qty 0.4

## 2014-06-22 MED ORDER — GABAPENTIN 600 MG PO TABS
300.0000 mg | ORAL_TABLET | Freq: Every day | ORAL | Status: DC
  Filled 2014-06-22: qty 0.5

## 2014-06-22 MED ORDER — VANCOMYCIN HCL 10 G IV SOLR
1250.0000 mg | Freq: Two times a day (BID) | INTRAVENOUS | Status: DC
  Filled 2014-06-22 (×3): qty 1250

## 2014-06-22 MED ORDER — POTASSIUM CHLORIDE CRYS ER 20 MEQ PO TBCR
40.0000 meq | EXTENDED_RELEASE_TABLET | Freq: Once | ORAL | Status: AC
  Administered 2014-06-22: 40 meq via ORAL
  Filled 2014-06-22: qty 2

## 2014-06-22 MED ORDER — POTASSIUM CHLORIDE 10 MEQ/100ML IV SOLN
10.0000 meq | INTRAVENOUS | Status: AC
  Administered 2014-06-22 (×3): 10 meq via INTRAVENOUS
  Filled 2014-06-22 (×3): qty 100

## 2014-06-22 NOTE — Significant Event (Signed)
CRITICAL VALUE ALERT  Critical value received:  K+ 2.9  Date of notification:  06/22/2014   Time of notification:  6:23 AM   Critical value read back:yes  Nurse who received alert: Hinton Dyer  MD notified (1st page):  Hospitalist, Rogue Bussing, T Time of first page:  6:26 AM      Awaiting response

## 2014-06-22 NOTE — Progress Notes (Signed)
ANTIBIOTIC CONSULT NOTE - INITIAL  Pharmacy Consult for vancomycin   Indication: cellulitis  Allergies  Allergen Reactions  . Other Swelling    Seaweed-Arms, hands and face swell     Patient Measurements: Height: 6' (182.9 cm) Weight: 205 lb (92.987 kg) IBW/kg (Calculated) : 73.1 Adjusted Body Weight:   Vital Signs: Temp: 98.4 F (36.9 C) (08/02 1907) Temp src: Oral (08/02 1907) BP: 190/74 mmHg (08/02 2333) Pulse Rate: 70 (08/02 2333) Intake/Output from previous day:   Intake/Output from this shift:    Labs:  Recent Labs  06/21/14 2253  WBC 6.7  HGB 13.5  PLT 211  CREATININE 0.61   Estimated Creatinine Clearance: 98.1 ml/min (by C-G formula based on Cr of 0.61). No results found for this basename: VANCOTROUGH, VANCOPEAK, VANCORANDOM, GENTTROUGH, GENTPEAK, GENTRANDOM, TOBRATROUGH, TOBRAPEAK, TOBRARND, AMIKACINPEAK, AMIKACINTROU, AMIKACIN,  in the last 72 hours   Microbiology: No results found for this or any previous visit (from the past 720 hour(s)).  Medical History: Past Medical History  Diagnosis Date  . Diabetes mellitus   . Hypertension   . Boil of buttock     Medications:  (Not in a hospital admission) Assessment: bilateral foot pain x2 weeks. vanc for cellulitis. 1gm in ED at 2345    Goal of Therapy:  Vancomycin trough level 10-15 mcg/ml  Plan:  Vancomycin 1250 mg q12h next dose 0800  Curlene Dolphin 06/22/2014,2:06 AM

## 2014-06-22 NOTE — H&P (Signed)
[facility] Admission History and Physical       Katherine [name] KZL:[id number] DOB: 29-Mar-1956 DOA: 06/21/2014  Referring physician: EDP PCP: Lorayne Marek, MD  Specialists:   Chief Complaint:  Painful Feet  HPI: Katherine [name] is a 58 y.o. female with a history of Uncontrolled DM2 who presents to the ED with complaints of severe pain in both feet and not being able to bear weight on her feet due to the pain.  She reports having redness and swelling of the great Toes of both feet x 2-3 days.   She denies having any fevers or chills.  X-Rays were performed of both feet and were negative for findings of Osteomyelitis.   She was placed on IV Vancomycin and Zosyn for Cellulitis and referred for medical admission.      Review of Systems:  Constitutional: No Weight Loss, No Weight Gain, Night Sweats, Fevers, Chills, Dizziness, Fatigue, or Generalized Weakness HEENT: No Headaches, Difficulty Swallowing,Tooth/Dental Problems,Sore Throat,  No Sneezing, Rhinitis, Ear Ache, Nasal Congestion, or Post Nasal Drip,  Cardio-vascular:  No Chest pain, Orthopnea, PND, Edema in Lower Extremities, Anasarca, Dizziness, Palpitations  Resp: No Dyspnea, No DOE, No Cough, No Hemoptysis, No Wheezing.    GI: No Heartburn, Indigestion, Abdominal Pain, Nausea, Vomiting, Diarrhea, Hematemesis, Hematochezia, Melena, Change in Bowel Habits,  Loss of Appetite  GU: No Dysuria, Change in Color of Urine, No Urgency or Frequency, No Flank pain.  Musculoskeletal: +Bilateral Foot pain and Swelling, No Decreased Range of Motion, No Back Pain.  Neurologic: No Syncope, No Seizures, Muscle Weakness, Paresthesia, Vision Disturbance or Loss, No Diplopia, No Vertigo, No Difficulty Walking,  Skin: No Rash or Lesions. Psych: No Change in Mood or Affect, No Depression or Anxiety, No Memory loss, No Confusion, or Hallucinations   Past Medical History  Diagnosis Date  . Diabetes mellitus   . Hypertension   . Boil of buttock       Past Surgical History  Procedure Laterality Date  . Appendectomy       Prior to Admission medications   Medication Sig Start Date End Date Taking? Authorizing Provider  amLODipine (NORVASC) 10 MG tablet Take 10 mg by mouth daily. 12/26/13  Yes Reyne Dumas, MD  Insulin Detemir (LEVEMIR) 100 UNIT/ML Pen Inject 30 Units into the skin at bedtime as needed (when sugar is higher than 200). 01/09/14  Yes Angelica Chessman, MD  metFORMIN (GLUCOPHAGE) 500 MG tablet Take 1,000 mg by mouth 2 (two) times daily. Patient states she takes 1000mg  twice a day per patient 12/26/13  Yes Reyne Dumas, MD  olmesartan-hydrochlorothiazide (BENICAR HCT) 20-12.5 MG per tablet Take 1 tablet by mouth daily. 01/09/14  Yes Angelica Chessman, MD  promethazine (PHENERGAN) 25 MG tablet Take 1 tablet (25 mg total) by mouth every 8 (eight) hours as needed for nausea or vomiting. 12/05/13  Yes Robbie Lis, MD  Alcohol Swabs PADS 1 Package by Does not apply route as needed. 12/05/13   Robbie Lis, MD  Blood Glucose-BP Monitor (BLOOD GLUCOSE-WRIST BP MONITOR) DEVI 1 each by Does not apply route once. 12/05/13   Robbie Lis, MD  glucose blood (BL TEST STRIP PACK) test strip Use as instructed 12/05/13   Robbie Lis, MD  Lancets (FREESTYLE) lancets Use as instructed 12/05/13   Robbie Lis, MD     Allergies  Allergen Reactions  . Other Swelling    Seaweed-Arms, hands and face swell     Social History:  reports that  she has never smoked. She does not have any smokeless tobacco history on file. She reports that she does not drink alcohol or use illicit drugs.     Family History  Problem Relation Age of Onset  . Diabetes Father   . Hypertension Father   . Cancer Maternal Grandmother   . Hypertension Maternal Grandfather        Physical Exam:  GEN:  Pleasant Obese 58 y.o. African American female examined and in no acute distress; cooperative with exam Filed Vitals:   06/21/14 2230 06/21/14 2245 06/21/14 2330  06/21/14 2333  BP: 169/86 185/83 190/74 190/74  Pulse:    70  Temp:      TempSrc:      Resp: 24 16 13 18   Height:      Weight:      SpO2:    95%   Blood pressure 190/74, pulse 70, temperature 98.4 F (36.9 C), temperature source Oral, resp. rate 18, height 6' (1.829 m), weight 92.987 kg (205 lb), SpO2 95.00%. PSYCH: She is alert and oriented x4; does not appear anxious does not appear depressed; affect is normal HEENT: Normocephalic and Atraumatic, Mucous membranes pink; PERRLA; EOM intact; Fundi:  Benign;  No scleral icterus, Nares: Patent, Oropharynx: Clear, Poor Dentition,    Neck:  FROM, No Cervical Lymphadenopathy nor Thyromegaly or Carotid Bruit; No JVD; Breasts:: Not examined CHEST WALL: No tenderness CHEST: Normal respiration, clear to auscultation bilaterally HEART: Regular rate and rhythm; no murmurs rubs or gallops BACK: No kyphosis or scoliosis; No CVA tenderness ABDOMEN: Positive Bowel Sounds, Obese, Soft Non-Tender; No Masses, No Organomegaly. Rectal Exam: Not done EXTREMITIES: No Cyanosis, Clubbing, or Edema; No Ulcerations. Genitalia: not examined PULSES: 2+ and symmetric SKIN: Normal hydration no rash or ulceration CNS:  Alert and Oriented x 4.  No Focal Deficits  Vascular: pulses palpable throughout    Labs on Admission:  Basic Metabolic Panel:  Recent Labs Lab 06/21/14 2253  NA 136*  K 3.6*  CL 94*  CO2 28  GLUCOSE 515*  BUN 8  CREATININE 0.61  CALCIUM 8.8   Liver Function Tests: No results found for this basename: AST, ALT, ALKPHOS, BILITOT, PROT, ALBUMIN,  in the last 168 hours No results found for this basename: LIPASE, AMYLASE,  in the last 168 hours No results found for this basename: AMMONIA,  in the last 168 hours CBC:  Recent Labs Lab 06/21/14 2253  WBC 6.7  NEUTROABS 3.4  HGB 13.5  HCT 39.6  MCV 84.8  PLT 211   Cardiac Enzymes: No results found for this basename: CKTOTAL, CKMB, CKMBINDEX, TROPONINI,  in the last 168  hours  BNP (last 3 results)  Recent Labs  11/26/13 1027  PROBNP 494.0*   CBG:  Recent Labs Lab 06/22/14 0105  GLUCAP 394*    Radiological Exams on Admission: Dg Chest 2 View  06/22/2014   CLINICAL DATA:  Foot pain.  History of hypertension.  EXAM: CHEST  2 VIEW  COMPARISON:  11/26/2013  FINDINGS: Normal heart size and mediastinal contours. No acute infiltrate or edema. No effusion or pneumothorax. No acute osseous findings.  IMPRESSION: No active cardiopulmonary disease.   Electronically Signed   By: Jorje Guild M.D.   On: 06/22/2014 00:35   Dg Foot Complete Right  06/22/2014   CLINICAL DATA:  Bilateral foot pain.  Diabetes  EXAM: RIGHT FOOT COMPLETE - 3+ VIEW  COMPARISON:  None.  FINDINGS: Negative for fracture.  Negative for osteomyelitis.  Pes planus  with degenerative changes in the midfoot.  IMPRESSION: Midfoot degenerative change.  No acute abnormality.   Electronically Signed   By: Franchot Gallo M.D.   On: 06/22/2014 00:36     EKG: Independently reviewed.    Assessment/Plan:   58 y.o. female with  Principal Problem:      1.   Cellulitis of great toe of left foot/ Cellulitis of great toe of right foot   IV Vancomycin, Zosyn, and Fluconazole      Active Problems:   2.   ONYCHOMYCOSIS   IV Fluconazole     3.   DIABETES MELLITUS, TYPE II, UNCONTROLLED   Continue Lantus 30 Units SQ qhs   Hold Metformin rx   SSI coverage PRN    Check HbA1c in AM.        4.   HYPERCHOLESTEROLEMIA   Not on RX currently   Check Lipids        5.   HYPERTENSION, BENIGN ESSENTIAL   continue ARB rx.    Monitor BPs.        7.   GERD   continue PPI rx.        8.   DVT Prophylaxis    Lovenox        Code Status:      FULL CODE Family Communication:  No Family Present Disposition Plan:         Time spent:  [place] [facility] Pager [phone]   If [place] Please Contact the Day Rounding Team MD for [facility]  If 7PM-7AM,  Please Contact night-coverage  [email or web address] Password [email or web address] 06/22/2014, 2:06 AM

## 2014-06-22 NOTE — Progress Notes (Signed)
Inpatient Diabetes Program Recommendations  AACE/ADA: New Consensus Statement on Inpatient Glycemic Control (2013)  Target Ranges:  Prepandial:   less than 140 mg/dL      Peak postprandial:   less than 180 mg/dL (1-2 hours)      Critically ill patients:  140 - 180 mg/dL  Results for Katherine [name], Katherine [name] (MRN [id number]) as of 06/22/2014 14:16  Ref. Range 06/22/2014 01:05 06/22/2014 02:59 06/22/2014 06:59 06/22/2014 11:19  Glucose-Capillary Latest Range: 70-99 mg/dL 394 (H) 266 (H) 242 (H) 327 (H)   Inpatient Diabetes Program Recommendations Insulin - Meal Coverage: add Novolog 5 units TID with meals  Thank you  Raoul Pitch BSN, RN,CDE Inpatient Diabetes Coordinator [phone] (team pager)

## 2014-06-22 NOTE — ED Notes (Signed)
Report to dana rn on 5N.

## 2014-06-22 NOTE — Progress Notes (Signed)
Patient admitted after midnight.  Please see H&P.  Continue IV abx  Eulogio Bear DO

## 2014-06-22 NOTE — ED Notes (Signed)
CBG was 394.

## 2014-06-22 NOTE — ED Provider Notes (Signed)
Medical screening examination/treatment/procedure(s) were conducted as a shared visit with non-physician practitioner(s) and myself.  I personally evaluated the patient during the encounter.  Bilateral great toe pain xseveral weeks with redness and swelling today.. No trauma.  No fever. She is diabetic. Erythematous great toes, L>R.  +onchomycosis. Favor cellulitis over gout.  No involvement of MTP joint.   EKG Interpretation None       Ezequiel Essex, MD 06/22/14 [id number]

## 2014-06-23 DIAGNOSIS — E1159 Type 2 diabetes mellitus with other circulatory complications: Secondary | ICD-10-CM

## 2014-06-23 DIAGNOSIS — I798 Other disorders of arteries, arterioles and capillaries in diseases classified elsewhere: Secondary | ICD-10-CM

## 2014-06-23 LAB — BASIC METABOLIC PANEL
Anion gap: 11 (ref 5–15)
BUN: 14 mg/dL (ref 6–23)
CO2: 29 mEq/L (ref 19–32)
Calcium: 8.9 mg/dL (ref 8.4–10.5)
Chloride: 98 mEq/L (ref 96–112)
Creatinine, Ser: 0.76 mg/dL (ref 0.50–1.10)
GFR calc Af Amer: >90 mL/min (ref >90)
GFR calc non Af Amer: >90 mL/min (ref >90)
Glucose, Bld: 327 mg/dL — ABNORMAL HIGH (ref 70–99)
Potassium: 3.7 mEq/L (ref 3.7–5.3)
Sodium: 138 mEq/L (ref 137–147)

## 2014-06-23 LAB — URINE CULTURE
COLONY COUNT: NO GROWTH
Culture: NO GROWTH

## 2014-06-23 LAB — CBC
HCT: 40.1 % (ref 36.0–46.0)
Hemoglobin: 13.2 g/dL (ref 12.0–15.0)
MCH: 28.4 pg (ref 26.0–34.0)
MCHC: 32.9 g/dL (ref 30.0–36.0)
MCV: 86.2 fL (ref 78.0–100.0)
Platelets: 223 10*3/uL (ref 150–400)
RBC: 4.65 MIL/uL (ref 3.87–5.11)
RDW: 12.6 % (ref 11.5–15.5)
WBC: 5.4 10*3/uL (ref 4.0–10.5)

## 2014-06-23 LAB — GLUCOSE, CAPILLARY
GLUCOSE-CAPILLARY: 330 mg/dL — AB (ref 70–99)
GLUCOSE-CAPILLARY: 291 mg/dL — AB (ref 70–99)
Glucose-Capillary: 140 mg/dL — ABNORMAL HIGH (ref 70–99)

## 2014-06-23 MED ORDER — CEPHALEXIN 500 MG PO CAPS: 500.0000 mg | ORAL_CAPSULE | Freq: Four times a day (QID) | ORAL | Status: DC

## 2014-06-23 MED ORDER — INSULIN DETEMIR 100 UNIT/ML ~~LOC~~ SOLN
35.0000 [IU] | Freq: Every day | SUBCUTANEOUS | Status: DC
  Filled 2014-06-23: qty 0.35

## 2014-06-23 MED ORDER — GABAPENTIN 300 MG PO CAPS: 300.0000 mg | ORAL_CAPSULE | Freq: Every day | ORAL | Status: DC

## 2014-06-23 MED ORDER — PNEUMOCOCCAL VAC POLYVALENT 25 MCG/0.5ML IJ INJ
0.5000 mL | INJECTION | INTRAMUSCULAR | Status: AC
  Administered 2014-06-23: 0.5 mL via INTRAMUSCULAR

## 2014-06-23 MED ORDER — INSULIN DETEMIR 100 UNIT/ML ~~LOC~~ SOLN: 40.0000 [IU] | Freq: Every day | SUBCUTANEOUS | Status: DC

## 2014-06-23 MED ORDER — INSULIN DETEMIR 100 UNIT/ML ~~LOC~~ SOLN
5.0000 [IU] | Freq: Once | SUBCUTANEOUS | Status: AC
  Administered 2014-06-23: 5 [IU] via SUBCUTANEOUS
  Filled 2014-06-23: qty 0.05

## 2014-06-23 MED ORDER — INSULIN ASPART 100 UNIT/ML ~~LOC~~ SOLN
0.0000 [IU] | Freq: Three times a day (TID) | SUBCUTANEOUS | Status: DC
Start: 2014-06-23 — End: 2014-07-30

## 2014-06-23 MED ORDER — INSULIN ASPART 100 UNIT/ML ~~LOC~~ SOLN: 0.0000 [IU] | Freq: Every day | SUBCUTANEOUS | Status: DC

## 2014-06-23 NOTE — Progress Notes (Signed)
Inpatient Diabetes Program Recommendations  AACE/ADA: New Consensus Statement on Inpatient Glycemic Control (2013)  Target Ranges:  Prepandial:   less than 140 mg/dL      Peak postprandial:   less than 180 mg/dL (1-2 hours)      Critically ill patients:  140 - 180 mg/dL   This coordinator met with patient to discuss A1C=14.7 and diabetes management at home.  Pt reports dietary indiscretion and drinks [facility] and sweet tea regularly.  Discussed carb content of both and how it far exceeds the allowed carb content of each meal.  Discussed "good" carbs vs "bad" carbs.  Pt reports she has not had any formal education so an OP order for the [facility] has been placed.  Patient was given a diabetes meal planning guide for reference.  No further questions/concerns at the end our our visit. Expect fair compliance. Thank you  Raoul Pitch BSN, RN,CDE Inpatient Diabetes Coordinator [phone] (team pager)

## 2014-06-23 NOTE — Progress Notes (Signed)
CBG 444 at 2130.  This was reported to Chaney Malling, NP, she is on call for Hospitalist.  She ordered an additional 3 units of Novolog along with the ordered 5 units the patient was to receive.  Patient was retested after receiving this and her Levemir coverage of 30 Units, at 22:45, her CBG 401.  Hospitalist was notified again of the result.  No additional orders were given.

## 2014-06-23 NOTE — Progress Notes (Signed)
Patient completed dose of Vancomycin and Zosyn today per MD order. She is being discharged home and was provided with discharge instructions, prescriptions, and diabetic teaching. Follow up appointments arranged. Awaiting patients husband to arrive for pick up.

## 2014-06-23 NOTE — Discharge Summary (Signed)
Physician Discharge Summary  Katherine [name] VFI:[id number] DOB: 06/18/1956 DOA: 06/21/2014  PCP: Lorayne Marek, MD  Admit date: 06/21/2014 Discharge date: 06/23/2014  Time spent: 35 minutes  Recommendations for Outpatient Follow-up:  Referral to endocrine for diabetic management HgbA1C in 3 months -added SSI  Discharge Diagnoses:  Principal Problem:   Cellulitis of great toe of left foot Active Problems:   ONYCHOMYCOSIS   DIABETES MELLITUS, TYPE II, UNCONTROLLED   HYPERCHOLESTEROLEMIA   HYPERTENSION, BENIGN ESSENTIAL   GERD   Cellulitis of great toe of right foot   Discharge Condition: improved  Diet recommendation: diabetic/cardiac  Filed Weights   06/21/14 1907  Weight: 92.987 kg (205 lb)    History of present illness:  Katherine [name] is a 58 y.o. female with a history of Uncontrolled DM2 who presents to the ED with complaints of severe pain in both feet and not being able to bear weight on her feet due to the pain. She reports having redness and swelling of the great Toes of both feet x 2-3 days. She denies having any fevers or chills. X-Rays were performed of both feet and were negative for findings of Osteomyelitis. She was placed on IV Vancomycin and Zosyn for Cellulitis and referred for medical admission   Hospital Course:  Uncontrolled DM- patient has never received education- will ask diabetic coordination to set up with outpatient education, increase levemir, add SSI -patient does not take levemir every night and feels poorly when BS run < 250 -normally 300ish at home - HGba1C > 14  Mild cellulitis- has responded to abx, needs better diabetic control, d/c on keflex  Peripheral neuropathy -add once daily neurontin   Procedures:  none  Consultations:  none  Discharge Exam: Filed Vitals:   06/23/14 0545  BP: 151/60  Pulse: 64  Temp: 98.2 F (36.8 C)  Resp: 16    General: A+Ox3, NAD Cardiovascular: rrr Respiratory: clear  Discharge  Instructions You were cared for by a hospitalist during your hospital stay. If you have any questions about your discharge medications or the care you received while you were in the hospital after you are discharged, you can call the unit and asked to speak with the hospitalist on call if the hospitalist that took care of you is not available. Once you are discharged, your primary care physician will handle any further medical issues. Please note that NO REFILLS for any discharge medications will be authorized once you are discharged, as it is imperative that you return to your primary care physician (or establish a relationship with a primary care physician if you do not have one) for your aftercare needs so that they can reassess your need for medications and monitor your lab values.      Discharge Instructions   Diet - low sodium heart healthy    Complete by:  As directed      Diet Carb Modified    Complete by:  As directed      Discharge instructions    Complete by:  As directed   Check blood sugars TID with meals and do sliding scale coverage Outpatient endocrine referral Diabetic teaching outpatient     Increase activity slowly    Complete by:  As directed             Medication List         Alcohol Swabs Pads  1 Package by Does not apply route as needed.     amLODipine 10 MG tablet  Commonly known as:  NORVASC  Take 10 mg by mouth daily.     Blood Glucose-Wrist BP Monitor Devi  1 each by Does not apply route once.     cephALEXin 500 MG capsule  Commonly known as:  KEFLEX  Take 1 capsule (500 mg total) by mouth 4 (four) times daily.     freestyle lancets  Use as instructed     gabapentin 300 MG capsule  Commonly known as:  NEURONTIN  Take 1 capsule (300 mg total) by mouth at bedtime.     glucose blood test strip  Commonly known as:  BL TEST STRIP PACK  Use as instructed     insulin aspart 100 UNIT/ML injection  Commonly known as:  novoLOG  - Inject 0-5 Units  into the skin at bedtime. CBG 70 - 120: 0 units  - CBG 121 - 150: 0 units  - CBG 151 - 200: 0 units  - CBG 201 - 250: 2 units  - CBG 251 - 300: 3 units  - CBG 301 - 350: 4 units  - CBG 351 - 400: 5 units     insulin aspart 100 UNIT/ML injection  Commonly known as:  novoLOG  - Inject 0-9 Units into the skin 3 (three) times daily with meals. CBG 70 - 120: 0 units  - CBG 121 - 150: 1 unit  - CBG 151 - 200: 2 units  - CBG 201 - 250: 3 units  - CBG 251 - 300: 5 units  - CBG 301 - 350: 7 units  - CBG 351 - 400: 9 units     insulin detemir 100 UNIT/ML injection  Commonly known as:  LEVEMIR  Inject 0.4 mLs (40 Units total) into the skin at bedtime.     metFORMIN 500 MG tablet  Commonly known as:  GLUCOPHAGE  Take 1,000 mg by mouth 2 (two) times daily. Patient states she takes 1000mg  twice a day per patient     olmesartan-hydrochlorothiazide 20-12.5 MG per tablet  Commonly known as:  BENICAR HCT  Take 1 tablet by mouth daily.     promethazine 25 MG tablet  Commonly known as:  PHENERGAN  Take 1 tablet (25 mg total) by mouth every 8 (eight) hours as needed for nausea or vomiting.       Allergies  Allergen Reactions  . Other Swelling    Seaweed-Arms, hands and face swell    Follow-up Information   Follow up with Lorayne Marek, MD In 1 week.   Specialty:  Internal Medicine   Contact information:   [place] [id number] [phone]        The results of significant diagnostics from this hospitalization (including imaging, microbiology, ancillary and laboratory) are listed below for reference.    Significant Diagnostic Studies: Dg Chest 2 View  06/22/2014   CLINICAL DATA:  Foot pain.  History of hypertension.  EXAM: CHEST  2 VIEW  COMPARISON:  11/26/2013  FINDINGS: Normal heart size and mediastinal contours. No acute infiltrate or edema. No effusion or pneumothorax. No acute osseous findings.  IMPRESSION: No active cardiopulmonary disease.    Electronically Signed   By: Jorje Guild M.D.   On: 06/22/2014 00:35   Dg Foot Complete Right  06/22/2014   CLINICAL DATA:  Bilateral foot pain.  Diabetes  EXAM: RIGHT FOOT COMPLETE - 3+ VIEW  COMPARISON:  None.  FINDINGS: Negative for fracture.  Negative for osteomyelitis.  Pes planus with degenerative changes in  the midfoot.  IMPRESSION: Midfoot degenerative change.  No acute abnormality.   Electronically Signed   By: Franchot Gallo M.D.   On: 06/22/2014 00:36    Microbiology: No results found for this or any previous visit (from the past 240 hour(s)).   Labs: Basic Metabolic Panel:  Recent Labs Lab 06/21/14 2253 06/22/14 0520 06/23/14 0539  NA 136* 141 138  K 3.6* 2.9* 3.7  CL 94* 100 98  CO2 28 30 29   GLUCOSE 515* 252* 327*  BUN 8 7 14   CREATININE 0.61 0.54 0.76  CALCIUM 8.8 8.2* 8.9  MG  --  2.0  --    Liver Function Tests: No results found for this basename: AST, ALT, ALKPHOS, BILITOT, PROT, ALBUMIN,  in the last 168 hours No results found for this basename: LIPASE, AMYLASE,  in the last 168 hours No results found for this basename: AMMONIA,  in the last 168 hours CBC:  Recent Labs Lab 06/21/14 2253 06/22/14 0520 06/23/14 0539  WBC 6.7 5.4 5.4  NEUTROABS 3.4  --   --   HGB 13.5 13.0 13.2  HCT 39.6 39.1 40.1  MCV 84.8 85.2 86.2  PLT 211 197 223   Cardiac Enzymes: No results found for this basename: CKTOTAL, CKMB, CKMBINDEX, TROPONINI,  in the last 168 hours BNP: BNP (last 3 results)  Recent Labs  11/26/13 1027  PROBNP 494.0*   CBG:  Recent Labs Lab 06/22/14 1119 06/22/14 1626 06/22/14 2121 06/22/14 2252 06/23/14 0619  GLUCAP 327* 333* 444* 401* 330*       Signed:  VANN, JESSICA  [facility] 06/23/2014, 9:22 AM

## 2014-07-21 ENCOUNTER — Ambulatory Visit: Payer: Self-pay | Attending: Internal Medicine | Admitting: Internal Medicine

## 2014-07-21 ENCOUNTER — Encounter: Payer: Self-pay | Admitting: Internal Medicine

## 2014-07-21 VITALS — BP 170/90 | HR 75 | Temp 98.5°F | Resp 16 | Wt 206.0 lb

## 2014-07-21 DIAGNOSIS — E139 Other specified diabetes mellitus without complications: Secondary | ICD-10-CM

## 2014-07-21 DIAGNOSIS — I10 Essential (primary) hypertension: Secondary | ICD-10-CM

## 2014-07-21 DIAGNOSIS — IMO0001 Reserved for inherently not codable concepts without codable children: Secondary | ICD-10-CM

## 2014-07-21 DIAGNOSIS — E089 Diabetes mellitus due to underlying condition without complications: Secondary | ICD-10-CM

## 2014-07-21 DIAGNOSIS — B351 Tinea unguium: Secondary | ICD-10-CM

## 2014-07-21 DIAGNOSIS — R030 Elevated blood-pressure reading, without diagnosis of hypertension: Secondary | ICD-10-CM

## 2014-07-21 LAB — GLUCOSE, POCT (MANUAL RESULT ENTRY): POC GLUCOSE: 296 mg/dL — AB (ref 70–99)

## 2014-07-21 MED ORDER — CLONIDINE HCL 0.1 MG PO TABS
0.2000 mg | ORAL_TABLET | Freq: Once | ORAL | Status: AC
  Administered 2014-07-21: 0.2 mg via ORAL

## 2014-07-21 MED ORDER — INSULIN ASPART 100 UNIT/ML ~~LOC~~ SOLN
10.0000 [IU] | Freq: Once | SUBCUTANEOUS | Status: AC
  Administered 2014-07-21: 10 [IU] via SUBCUTANEOUS

## 2014-07-21 MED ORDER — TERBINAFINE HCL 250 MG PO TABS: 250.0000 mg | ORAL_TABLET | Freq: Every day | ORAL | Status: DC

## 2014-07-21 MED ORDER — OLMESARTAN MEDOXOMIL-HCTZ 20-12.5 MG PO TABS: 1.0000 | ORAL_TABLET | Freq: Two times a day (BID) | ORAL | Status: DC

## 2014-07-21 MED ORDER — GABAPENTIN 300 MG PO CAPS: 300.0000 mg | ORAL_CAPSULE | Freq: Every day | ORAL | Status: DC

## 2014-07-21 NOTE — Patient Instructions (Addendum)
DASH Eating Plan DASH stands for "Dietary Approaches to Stop Hypertension." The DASH eating plan is a healthy eating plan that has been shown to reduce high blood pressure (hypertension). Additional health benefits may include reducing the risk of type 2 diabetes mellitus, heart disease, and stroke. The DASH eating plan may also help with weight loss. WHAT DO I NEED TO KNOW ABOUT THE DASH EATING PLAN? For the DASH eating plan, you will follow these general guidelines:  Choose foods with a percent daily value for sodium of less than 5% (as listed on the food label).  Use salt-free seasonings or herbs instead of table salt or sea salt.  Check with your health care provider or pharmacist before using salt substitutes.  Eat lower-sodium products, often labeled as "lower sodium" or "no salt added."  Eat fresh foods.  Eat more vegetables, fruits, and low-fat dairy products.  Choose whole grains. Look for the word "whole" as the first word in the ingredient list.  Choose fish and skinless chicken or turkey more often than red meat. Limit fish, poultry, and meat to 6 oz (170 g) each day.  Limit sweets, desserts, sugars, and sugary drinks.  Choose heart-healthy fats.  Limit cheese to 1 oz (28 g) per day.  Eat more home-cooked food and less restaurant, buffet, and fast food.  Limit fried foods.  Cook foods using methods other than frying.  Limit canned vegetables. If you do use them, rinse them well to decrease the sodium.  When eating at a restaurant, ask that your food be prepared with less salt, or no salt if possible. WHAT FOODS CAN I EAT? Seek help from a dietitian for individual calorie needs. Grains Whole grain or whole wheat bread. Brown rice. Whole grain or whole wheat pasta. Quinoa, bulgur, and whole grain cereals. Low-sodium cereals. Corn or whole wheat flour tortillas. Whole grain cornbread. Whole grain crackers. Low-sodium crackers. Vegetables Fresh or frozen vegetables  (raw, steamed, roasted, or grilled). Low-sodium or reduced-sodium tomato and vegetable juices. Low-sodium or reduced-sodium tomato sauce and paste. Low-sodium or reduced-sodium canned vegetables.  Fruits All fresh, canned (in natural juice), or frozen fruits. Meat and Other Protein Products Ground beef (85% or leaner), grass-fed beef, or beef trimmed of fat. Skinless chicken or turkey. Ground chicken or turkey. Pork trimmed of fat. All fish and seafood. Eggs. Dried beans, peas, or lentils. Unsalted nuts and seeds. Unsalted canned beans. Dairy Low-fat dairy products, such as skim or 1% milk, 2% or reduced-fat cheeses, low-fat ricotta or cottage cheese, or plain low-fat yogurt. Low-sodium or reduced-sodium cheeses. Fats and Oils Tub margarines without trans fats. Light or reduced-fat mayonnaise and salad dressings (reduced sodium). Avocado. Safflower, olive, or canola oils. Natural peanut or almond butter. Other Unsalted popcorn and pretzels. The items listed above may not be a complete list of recommended foods or beverages. Contact your dietitian for more options. WHAT FOODS ARE NOT RECOMMENDED? Grains White bread. White pasta. White rice. Refined cornbread. Bagels and croissants. Crackers that contain trans fat. Vegetables Creamed or fried vegetables. Vegetables in a cheese sauce. Regular canned vegetables. Regular canned tomato sauce and paste. Regular tomato and vegetable juices. Fruits Dried fruits. Canned fruit in light or heavy syrup. Fruit juice. Meat and Other Protein Products Fatty cuts of meat. Ribs, chicken wings, bacon, sausage, bologna, salami, chitterlings, fatback, hot dogs, bratwurst, and packaged luncheon meats. Salted nuts and seeds. Canned beans with salt. Dairy Whole or 2% milk, cream, half-and-half, and cream cheese. Whole-fat or sweetened yogurt. Full-fat   cheeses or blue cheese. Nondairy creamers and whipped toppings. Processed cheese, cheese spreads, or cheese  curds. Condiments Onion and garlic salt, seasoned salt, table salt, and sea salt. Canned and packaged gravies. Worcestershire sauce. Tartar sauce. Barbecue sauce. Teriyaki sauce. Soy sauce, including reduced sodium. Steak sauce. Fish sauce. Oyster sauce. Cocktail sauce. Horseradish. Ketchup and mustard. Meat flavorings and tenderizers. Bouillon cubes. Hot sauce. Tabasco sauce. Marinades. Taco seasonings. Relishes. Fats and Oils Butter, stick margarine, lard, shortening, ghee, and bacon fat. Coconut, palm kernel, or palm oils. Regular salad dressings. Other Pickles and olives. Salted popcorn and pretzels. The items listed above may not be a complete list of foods and beverages to avoid. Contact your dietitian for more information. WHERE CAN I FIND MORE INFORMATION? National [employer], [employer], [employer]: www.nhlbi.nih.gov/health/health-topics/topics/dash/ Document Released: 10/26/2011 Document Revised: 03/23/2014 Document Reviewed: 09/10/2013 [employer] Patient Information 2015 [employer]. This information is not intended to replace advice given to you by your health care provider. Make sure you discuss any questions you have with your health care provider. Diabetes Mellitus and Food It is important for you to manage your blood sugar (glucose) level. Your blood glucose level can be greatly affected by what you eat. Eating healthier foods in the appropriate amounts throughout the day at about the same time each day will help you control your blood glucose level. It can also help slow or prevent worsening of your diabetes mellitus. Healthy eating may even help you improve the level of your blood pressure and reach or maintain a healthy weight.  HOW CAN FOOD AFFECT ME? Carbohydrates Carbohydrates affect your blood glucose level more than any other type of food. Your dietitian will help you determine how many carbohydrates to eat at each meal and teach you how to count carbohydrates. Counting  carbohydrates is important to keep your blood glucose at a healthy level, especially if you are using insulin or taking certain medicines for diabetes mellitus. Alcohol Alcohol can cause sudden decreases in blood glucose (hypoglycemia), especially if you use insulin or take certain medicines for diabetes mellitus. Hypoglycemia can be a life-threatening condition. Symptoms of hypoglycemia (sleepiness, dizziness, and disorientation) are similar to symptoms of having too much alcohol.  If your health care provider has given you approval to drink alcohol, do so in moderation and use the following guidelines:  Women should not have more than one drink per day, and men should not have more than two drinks per day. One drink is equal to:  12 oz of beer.  5 oz of wine.  1 oz of hard liquor.  Do not drink on an empty stomach.  Keep yourself hydrated. Have water, diet soda, or unsweetened iced tea.  Regular soda, juice, and other mixers might contain a lot of carbohydrates and should be counted. WHAT FOODS ARE NOT RECOMMENDED? As you make food choices, it is important to remember that all foods are not the same. Some foods have fewer nutrients per serving than other foods, even though they might have the same number of calories or carbohydrates. It is difficult to get your body what it needs when you eat foods with fewer nutrients. Examples of foods that you should avoid that are high in calories and carbohydrates but low in nutrients include:  Trans fats (most processed foods list trans fats on the Nutrition Facts label).  Regular soda.  Juice.  Candy.  Sweets, such as cake, pie, doughnuts, and cookies.  Fried foods. WHAT FOODS CAN I EAT? Have nutrient-rich foods,   which will nourish your body and keep you healthy. The food you should eat also will depend on several factors, including:  The calories you need.  The medicines you take.  Your weight.  Your blood glucose level.  Your  blood pressure level.  Your cholesterol level. You also should eat a variety of foods, including:  Protein, such as meat, poultry, fish, tofu, nuts, and seeds (lean animal proteins are best).  Fruits.  Vegetables.  Dairy products, such as milk, cheese, and yogurt (low fat is best).  Breads, grains, pasta, cereal, rice, and beans.  Fats such as olive oil, trans fat-free margarine, canola oil, avocado, and olives. DOES EVERYONE WITH DIABETES MELLITUS HAVE THE SAME MEAL PLAN? Because every person with diabetes mellitus is different, there is not one meal plan that works for everyone. It is very important that you meet with a dietitian who will help you create a meal plan that is just right for you. Document Released: 08/03/2005 Document Revised: 11/11/2013 Document Reviewed: 10/03/2013 [facility] Patient Information 2015 [place], [place]. This information is not intended to replace advice given to you by your health care provider. Make sure you discuss any questions you have with your health care provider. Diabetes Mellitus and Food It is important for you to manage your blood sugar (glucose) level. Your blood glucose level can be greatly affected by what you eat. Eating healthier foods in the appropriate amounts throughout the day at about the same time each day will help you control your blood glucose level. It can also help slow or prevent worsening of your diabetes mellitus. Healthy eating may even help you improve the level of your blood pressure and reach or maintain a healthy weight.  HOW CAN FOOD AFFECT ME? Carbohydrates Carbohydrates affect your blood glucose level more than any other type of food. Your dietitian will help you determine how many carbohydrates to eat at each meal and teach you how to count carbohydrates. Counting carbohydrates is important to keep your blood glucose at a healthy level, especially if you are using insulin or taking certain medicines for diabetes  mellitus. Alcohol Alcohol can cause sudden decreases in blood glucose (hypoglycemia), especially if you use insulin or take certain medicines for diabetes mellitus. Hypoglycemia can be a life-threatening condition. Symptoms of hypoglycemia (sleepiness, dizziness, and disorientation) are similar to symptoms of having too much alcohol.  If your health care provider has given you approval to drink alcohol, do so in moderation and use the following guidelines: Women should not have more than one drink per day, and men should not have more than two drinks per day. One drink is equal to: 12 oz of beer. 5 oz of wine. 1 oz of hard liquor. Do not drink on an empty stomach. Keep yourself hydrated. Have water, diet soda, or unsweetened iced tea. Regular soda, juice, and other mixers might contain a lot of carbohydrates and should be counted. WHAT FOODS ARE NOT RECOMMENDED? As you make food choices, it is important to remember that all foods are not the same. Some foods have fewer nutrients per serving than other foods, even though they might have the same number of calories or carbohydrates. It is difficult to get your body what it needs when you eat foods with fewer nutrients. Examples of foods that you should avoid that are high in calories and carbohydrates but low in nutrients include: Trans fats (most processed foods list trans fats on the Nutrition Facts label). Regular soda. Juice. Candy. Sweets, such  as cake, pie, doughnuts, and cookies. Fried foods. WHAT FOODS CAN I EAT? Have nutrient-rich foods, which will nourish your body and keep you healthy. The food you should eat also will depend on several factors, including: The calories you need. The medicines you take. Your weight. Your blood glucose level. Your blood pressure level. Your cholesterol level. You also should eat a variety of foods, including: Protein, such as meat, poultry, fish, tofu, nuts, and seeds (lean animal proteins are  best). Fruits. Vegetables. Dairy products, such as milk, cheese, and yogurt (low fat is best). Breads, grains, pasta, cereal, rice, and beans. Fats such as olive oil, trans fat-free margarine, canola oil, avocado, and olives. DOES EVERYONE WITH DIABETES MELLITUS HAVE THE SAME MEAL PLAN? Because every person with diabetes mellitus is different, there is not one meal plan that works for everyone. It is very important that you meet with a dietitian who will help you create a meal plan that is just right for you. Document Released: 08/03/2005 Document Revised: 11/11/2013 Document Reviewed: 10/03/2013 [facility] Patient Information 2015 [place], [place]. This information is not intended to replace advice given to you by your health care provider. Make sure you discuss any questions you have with your health care provider.

## 2014-07-21 NOTE — Progress Notes (Signed)
Patient here for follow up on dm and htn Today presents with elevated blood pressure and blood sugar

## 2014-07-21 NOTE — Progress Notes (Signed)
MRN: [id number] Name: Katherine [name]  Sex: female Age: 58 y.o. DOB: August 17, 1956  Allergies: Other  Chief Complaint  Patient presents with  . Follow-up    HPI: Patient is 57 y.o. female who has history of diabetes hypertension, today her blood pressure is elevated also her sugar levels are elevated, as per patient she has been taking her medications regularly, denies any headache dizziness chest and shortness of breath, for diabetes she is taking Levemir 40 units 2 times a day and is on insulin NovoLog sliding scale as per patient on average she takes 7 units, for hypertension she is on amlodipine as well as Benicar, she was given clonidine and her repeat manual blood pressure is 170/90. Patient also reported to have history of toenail fungus  Past Medical History  Diagnosis Date  . Diabetes mellitus   . Hypertension   . Boil of buttock     Past Surgical History  Procedure Laterality Date  . Appendectomy        Medication List       This list is accurate as of: 07/21/14  4:45 PM.  Always use your most recent med list.               Alcohol Swabs Pads  1 Package by Does not apply route as needed.     amLODipine 10 MG tablet  Commonly known as:  NORVASC  Take 10 mg by mouth daily.     Blood Glucose-Wrist BP Monitor Devi  1 each by Does not apply route once.     cephALEXin 500 MG capsule  Commonly known as:  KEFLEX  Take 1 capsule (500 mg total) by mouth 4 (four) times daily.     freestyle lancets  Use as instructed     gabapentin 300 MG capsule  Commonly known as:  NEURONTIN  Take 1 capsule (300 mg total) by mouth at bedtime.     glucose blood test strip  Commonly known as:  BL TEST STRIP PACK  Use as instructed     insulin aspart 100 UNIT/ML injection  Commonly known as:  novoLOG  - Inject 0-5 Units into the skin at bedtime. CBG 70 - 120: 0 units  - CBG 121 - 150: 0 units  - CBG 151 - 200: 0 units  - CBG 201 - 250: 2 units  - CBG 251 - 300: 3  units  - CBG 301 - 350: 4 units  - CBG 351 - 400: 5 units     insulin aspart 100 UNIT/ML injection  Commonly known as:  novoLOG  - Inject 0-9 Units into the skin 3 (three) times daily with meals. CBG 70 - 120: 0 units  - CBG 121 - 150: 1 unit  - CBG 151 - 200: 2 units  - CBG 201 - 250: 3 units  - CBG 251 - 300: 5 units  - CBG 301 - 350: 7 units  - CBG 351 - 400: 9 units     insulin detemir 100 UNIT/ML injection  Commonly known as:  LEVEMIR  Inject 0.4 mLs (40 Units total) into the skin at bedtime.     metFORMIN 500 MG tablet  Commonly known as:  GLUCOPHAGE  Take 1,000 mg by mouth 2 (two) times daily. Patient states she takes 1000mg  twice a day per patient     olmesartan-hydrochlorothiazide 20-12.5 MG per tablet  Commonly known as:  BENICAR HCT  Take 1 tablet by mouth 2 (two) times  daily.     promethazine 25 MG tablet  Commonly known as:  PHENERGAN  Take 1 tablet (25 mg total) by mouth every 8 (eight) hours as needed for nausea or vomiting.     terbinafine 250 MG tablet  Commonly known as:  LAMISIL  Take 1 tablet (250 mg total) by mouth daily.        Meds ordered this encounter  Medications  . insulin aspart (novoLOG) injection 10 Units    Sig:   . cloNIDine (CATAPRES) tablet 0.2 mg    Sig:   . gabapentin (NEURONTIN) 300 MG capsule    Sig: Take 1 capsule (300 mg total) by mouth at bedtime.    Dispense:  30 capsule    Refill:  3  . olmesartan-hydrochlorothiazide (BENICAR HCT) 20-12.5 MG per tablet    Sig: Take 1 tablet by mouth 2 (two) times daily.    Dispense:  60 tablet    Refill:  3  . terbinafine (LAMISIL) 250 MG tablet    Sig: Take 1 tablet (250 mg total) by mouth daily.    Dispense:  30 tablet    Refill:  2    Immunization History  Administered Date(s) Administered  . Influenza Split 01/13/2013  . Influenza Whole 10/28/2008, 09/10/2009  . PPD Test 02/02/2014  . Pneumococcal Polysaccharide-23 07/14/2008, 06/23/2014  . Td 10/21/2009     Family History  Problem Relation Age of Onset  . Diabetes Father   . Hypertension Father   . Cancer Maternal Grandmother   . Hypertension Maternal Grandfather     History  Substance Use Topics  . Smoking status: Never Smoker   . Smokeless tobacco: Not on file  . Alcohol Use: No    Review of Systems   As noted in HPI  Filed Vitals:   07/21/14 1637  BP: 170/90  Pulse:   Temp:   Resp:     Physical Exam  Physical Exam  Constitutional: No distress.  Eyes: EOM are normal. Pupils are equal, round, and reactive to light.  Cardiovascular: Normal rate and regular rhythm.   Pulmonary/Chest: Breath sounds normal. No respiratory distress. She has no wheezes. She has no rales.  Musculoskeletal:  Onychomycosis toe nail    CBC    Component Value Date/Time   WBC 5.4 06/23/2014 0539   RBC 4.65 06/23/2014 0539   HGB 13.2 06/23/2014 0539   HCT 40.1 06/23/2014 0539   PLT 223 06/23/2014 0539   MCV 86.2 06/23/2014 0539   LYMPHSABS 2.7 06/21/2014 2253   MONOABS 0.4 06/21/2014 2253   EOSABS 0.2 06/21/2014 2253   BASOSABS 0.0 06/21/2014 2253    CMP     Component Value Date/Time   NA 138 06/23/2014 0539   K 3.7 06/23/2014 0539   CL 98 06/23/2014 0539   CO2 29 06/23/2014 0539   GLUCOSE 327* 06/23/2014 0539   BUN 14 06/23/2014 0539   CREATININE 0.76 06/23/2014 0539   CREATININE 0.86 12/05/2013 1133   CALCIUM 8.9 06/23/2014 0539   PROT 7.0 11/26/2013 1027   ALBUMIN 3.3* 11/26/2013 1027   AST 9 11/26/2013 1027   ALT 9 11/26/2013 1027   ALKPHOS 119* 11/26/2013 1027   BILITOT 0.5 11/26/2013 1027   GFRNONAA >90 06/23/2014 0539   GFRAA >90 06/23/2014 0539    Lab Results  Component Value Date/Time   CHOL 173 12/05/2013 11:33 AM    No components found with this basename: hga1c    Lab Results  Component Value Date/Time  AST 9 11/26/2013 10:27 AM    Assessment and Plan  Diabetes mellitus due to underlying condition without complications - Plan:  Results for orders placed in visit on 07/21/14  GLUCOSE, POCT  (MANUAL RESULT ENTRY)      Result Value Ref Range   POC Glucose 296 (*) 70 - 99 mg/dl    Glucose (CBG), insulin aspart (novoLOG) injection 10 Units, diabetes is uncontrolled, I have advised patient to increase the dose of Levemir to 45 units twice a day, continue with insulin sliding scale, advised for diabetes meal planning and also Ambulatory referral to Endocrinology  Elevated blood pressure - Plan: cloNIDine (CATAPRES) tablet 0.2 mg  Onychomycosis - Plan: Started patient on terbinafine (LAMISIL) 250 MG tablet, she also follows with podiatry.  Essential hypertension, benign - Plan: I have increased the dose of olmesartan-hydrochlorothiazide (BENICAR HCT) 20-12.5 MG per tablet twice a day, continue with amlodipine 10 mg. Patient will come back in 2 weeks for BP check.    Return in about 3 months (around 10/20/2014) for diabetes, hypertension, BP and CBG check in 2 weeks/Nurse Visit.  Lorayne Marek, MD

## 2014-07-30 ENCOUNTER — Encounter (HOSPITAL_COMMUNITY): Payer: Self-pay | Admitting: Emergency Medicine

## 2014-07-30 ENCOUNTER — Ambulatory Visit: Payer: Self-pay | Attending: Internal Medicine

## 2014-07-30 ENCOUNTER — Emergency Department (HOSPITAL_COMMUNITY)
Admission: EM | Admit: 2014-07-30 | Discharge: 2014-07-30 | Disposition: A | Discharge status: Home / Self Care | Payer: Self-pay | Attending: Emergency Medicine | Admitting: Emergency Medicine

## 2014-07-30 DIAGNOSIS — M79609 Pain in unspecified limb: Secondary | ICD-10-CM | POA: Insufficient documentation

## 2014-07-30 DIAGNOSIS — Z872 Personal history of diseases of the skin and subcutaneous tissue: Secondary | ICD-10-CM | POA: Insufficient documentation

## 2014-07-30 DIAGNOSIS — M79671 Pain in right foot: Secondary | ICD-10-CM

## 2014-07-30 DIAGNOSIS — Z79899 Other long term (current) drug therapy: Secondary | ICD-10-CM | POA: Insufficient documentation

## 2014-07-30 DIAGNOSIS — E119 Type 2 diabetes mellitus without complications: Secondary | ICD-10-CM | POA: Insufficient documentation

## 2014-07-30 DIAGNOSIS — Z794 Long term (current) use of insulin: Secondary | ICD-10-CM | POA: Insufficient documentation

## 2014-07-30 DIAGNOSIS — Z792 Long term (current) use of antibiotics: Secondary | ICD-10-CM | POA: Insufficient documentation

## 2014-07-30 DIAGNOSIS — M79672 Pain in left foot: Secondary | ICD-10-CM

## 2014-07-30 DIAGNOSIS — R739 Hyperglycemia, unspecified: Secondary | ICD-10-CM

## 2014-07-30 DIAGNOSIS — I10 Essential (primary) hypertension: Secondary | ICD-10-CM | POA: Insufficient documentation

## 2014-07-30 LAB — CBC WITH DIFFERENTIAL/PLATELET
BASOS ABS: 0 10*3/uL (ref 0.0–0.1)
Basophils Relative: 0 % (ref 0–1)
Eosinophils Absolute: 0.2 10*3/uL (ref 0.0–0.7)
Eosinophils Relative: 3 % (ref 0–5)
HEMATOCRIT: 39.6 % (ref 36.0–46.0)
Hemoglobin: 13.2 g/dL (ref 12.0–15.0)
LYMPHS PCT: 42 % (ref 12–46)
Lymphs Abs: 2.3 10*3/uL (ref 0.7–4.0)
MCH: 29.2 pg (ref 26.0–34.0)
MCHC: 33.3 g/dL (ref 30.0–36.0)
MCV: 87.6 fL (ref 78.0–100.0)
Monocytes Absolute: 0.3 10*3/uL (ref 0.1–1.0)
Monocytes Relative: 5 % (ref 3–12)
NEUTROS ABS: 2.7 10*3/uL (ref 1.7–7.7)
NEUTROS PCT: 50 % (ref 43–77)
PLATELETS: 223 10*3/uL (ref 150–400)
RBC: 4.52 MIL/uL (ref 3.87–5.11)
RDW: 12.9 % (ref 11.5–15.5)
WBC: 5.5 10*3/uL (ref 4.0–10.5)

## 2014-07-30 LAB — COMPREHENSIVE METABOLIC PANEL
ALBUMIN: 3.3 g/dL — AB (ref 3.5–5.2)
ALT: 15 U/L (ref 0–35)
AST: 16 U/L (ref 0–37)
Alkaline Phosphatase: 110 U/L (ref 39–117)
Anion gap: 13 (ref 5–15)
BILIRUBIN TOTAL: 0.3 mg/dL (ref 0.3–1.2)
BUN: 13 mg/dL (ref 6–23)
CO2: 28 meq/L (ref 19–32)
Calcium: 8.6 mg/dL (ref 8.4–10.5)
Chloride: 103 mEq/L (ref 96–112)
Creatinine, Ser: 0.8 mg/dL (ref 0.50–1.10)
GFR calc Af Amer: >90 mL/min (ref >90)
GFR, EST NON AFRICAN AMERICAN: 80 mL/min — AB (ref >90)
Glucose, Bld: 245 mg/dL — ABNORMAL HIGH (ref 70–99)
Potassium: 3.7 mEq/L (ref 3.7–5.3)
SODIUM: 144 meq/L (ref 137–147)
Total Protein: 6.9 g/dL (ref 6.0–8.3)

## 2014-07-30 LAB — URINALYSIS, ROUTINE W REFLEX MICROSCOPIC
Bilirubin Urine: NEGATIVE
Glucose, UA: >1000 mg/dL — AB
KETONES UR: NEGATIVE mg/dL
Leukocytes, UA: NEGATIVE
Nitrite: NEGATIVE
Protein, ur: NEGATIVE mg/dL
Specific Gravity, Urine: 1.022 (ref 1.005–1.030)
Urobilinogen, UA: 1 mg/dL (ref 0.0–1.0)
pH: 6 (ref 5.0–8.0)

## 2014-07-30 LAB — URINE MICROSCOPIC-ADD ON

## 2014-07-30 LAB — CBG MONITORING, ED: Glucose-Capillary: 274 mg/dL — ABNORMAL HIGH (ref 70–99)

## 2014-07-30 MED ORDER — SODIUM CHLORIDE 0.9 % IV SOLN
Freq: Once | INTRAVENOUS | Status: AC
  Administered 2014-07-30: 21:00:00 via INTRAVENOUS

## 2014-07-30 MED ORDER — HYDROMORPHONE HCL PF 1 MG/ML IJ SOLN
0.5000 mg | Freq: Once | INTRAMUSCULAR | Status: AC
  Administered 2014-07-30: 0.5 mg via INTRAVENOUS
  Filled 2014-07-30: qty 1

## 2014-07-30 MED ORDER — HYDROCODONE-ACETAMINOPHEN 5-325 MG PO TABS: 1.0000 | ORAL_TABLET | Freq: Four times a day (QID) | ORAL | Status: DC | PRN

## 2014-07-30 NOTE — ED Provider Notes (Signed)
CSN: [id number]     Arrival date & time 07/30/14  1736 History   First MD Initiated Contact with Patient 07/30/14 1914     Chief Complaint  Patient presents with  . Hyperglycemia     HPI  Patient presents with concerns of hyperglycemia, and lower extremity pain. Patient has had pain in both feet 4 several weeks, including a more severe episode following development of erythema about the right great toe. Since onset patient has seen her physician, and is currently taking antibiotics for superficial infection of the right great toe. She denies current fevers, chills, nausea, vomiting. She does endorse generalized weakness, without passing out, chest pain, difficulty breathing. She takes her medication as directed, including insulin, 3 times daily. No relief of the lower extremity pain with OTC medication.   Past Medical History  Diagnosis Date  . Diabetes mellitus   . Hypertension   . Boil of buttock    Past Surgical History  Procedure Laterality Date  . Appendectomy     Family History  Problem Relation Age of Onset  . Diabetes Father   . Hypertension Father   . Cancer Maternal Grandmother   . Hypertension Maternal Grandfather    History  Substance Use Topics  . Smoking status: Never Smoker   . Smokeless tobacco: Not on file  . Alcohol Use: No   OB History   Grav Para Term Preterm Abortions TAB SAB Ect Mult Living                 Review of Systems  Constitutional:       Per HPI, otherwise negative  HENT:       Per HPI, otherwise negative  Respiratory:       Per HPI, otherwise negative  Cardiovascular:       Per HPI, otherwise negative  Gastrointestinal: Negative for vomiting.  Endocrine:       Negative aside from HPI  Genitourinary:       Neg aside from HPI   Musculoskeletal:       Per HPI, otherwise negative  Skin: Negative.   Neurological: Negative for syncope.      Allergies  Other  Home Medications   Prior to Admission medications    Medication Sig Start Date End Date Taking? Authorizing Provider  amLODipine (NORVASC) 10 MG tablet Take 10 mg by mouth daily. 12/26/13  Yes Reyne Dumas, MD  cephALEXin (KEFLEX) 500 MG capsule Take 1 capsule (500 mg total) by mouth 4 (four) times daily. 06/23/14  Yes Geradine Girt, DO  gabapentin (NEURONTIN) 300 MG capsule Take 300 mg by mouth 3 (three) times daily.   Yes Historical Provider, MD  insulin aspart (NOVOLOG) 100 UNIT/ML injection Inject 0-5 Units into the skin at bedtime. CBG 70 - 120: 0 units CBG 121 - 150: 0 units CBG 151 - 200: 0 units CBG 201 - 250: 2 units CBG 251 - 300: 3 units CBG 301 - 350: 4 units CBG 351 - 400: 5 units 06/23/14  Yes Jessica U Vann, DO  insulin detemir (LEVEMIR) 100 UNIT/ML injection Inject 0.4 mLs (40 Units total) into the skin at bedtime. 06/23/14  Yes Geradine Girt, DO  metFORMIN (GLUCOPHAGE) 500 MG tablet Take 1,000 mg by mouth 2 (two) times daily. Patient states she takes 1000mg  twice a day per patient 12/26/13  Yes Reyne Dumas, MD  Multiple Vitamin (MULTIVITAMIN WITH MINERALS) TABS tablet Take 1 tablet by mouth daily.   Yes Historical Provider, MD  olmesartan-hydrochlorothiazide (BENICAR HCT) 20-12.5 MG per tablet Take 1 tablet by mouth 2 (two) times daily. 07/21/14  Yes Lorayne Marek, MD  promethazine (PHENERGAN) 25 MG tablet Take 1 tablet (25 mg total) by mouth every 8 (eight) hours as needed for nausea or vomiting. 12/05/13  Yes Robbie Lis, MD  terbinafine (LAMISIL) 250 MG tablet Take 1 tablet (250 mg total) by mouth daily. 07/21/14  Yes Lorayne Marek, MD  HYDROcodone-acetaminophen (NORCO/VICODIN) 5-325 MG per tablet Take 1 tablet by mouth every 6 (six) hours as needed for severe pain. 07/30/14   Carmin Muskrat, MD   BP 197/82  Pulse 56  Temp(Src) 98 F (36.7 C) (Oral)  Resp 16  Ht 6' (1.829 m)  Wt 206 lb (93.441 kg)  BMI 27.93 kg/m2  SpO2 100% Physical Exam  Nursing note and vitals reviewed. Constitutional: She is oriented to person, place, and  time. She appears well-developed and well-nourished. No distress.  HENT:  Head: Normocephalic and atraumatic.  Eyes: Conjunctivae and EOM are normal.  Cardiovascular: Normal rate and regular rhythm.   Pulmonary/Chest: Effort normal and breath sounds normal. No stridor. No respiratory distress.  Abdominal: She exhibits no distension.  Musculoskeletal: She exhibits no edema.  Patient moves all extremities spontaneously, no gross deformities  Neurological: She is alert and oriented to person, place, and time. She displays no atrophy and no tremor. No cranial nerve deficit or sensory deficit. She exhibits normal muscle tone. She displays no seizure activity.  Skin: Skin is warm and dry.  The feet have good cap refill, appropriate color. The right great toe is covered with bandage, which the patient requests not be removed.  However, there is no erythema at the base of the bandaging, no discharge or drainage, no bleeding.   Psychiatric: She has a normal mood and affect.    ED Course  Procedures (including critical care time) Labs Review Labs Reviewed  COMPREHENSIVE METABOLIC PANEL - Abnormal; Notable for the following:    Glucose, Bld 245 (*)    Albumin 3.3 (*)    GFR calc non Af Amer 80 (*)    All other components within normal limits  URINALYSIS, ROUTINE W REFLEX MICROSCOPIC - Abnormal; Notable for the following:    Glucose, UA >1000 (*)    Hgb urine dipstick SMALL (*)    All other components within normal limits  URINE MICROSCOPIC-ADD ON - Abnormal; Notable for the following:    Squamous Epithelial / LPF FEW (*)    All other components within normal limits  CBG MONITORING, ED - Abnormal; Notable for the following:    Glucose-Capillary 274 (*)    All other components within normal limits  CBC WITH DIFFERENTIAL  CBG MONITORING, ED    9:59 PM On repeat exam the patient is awakened from sleep.  She awakens easily, is in no distress, states that she feels substantially  improved.  MDM   Final diagnoses:  Hyperglycemia  Foot pain, bilateral    Patient presents with a pain, concern for hyperglycemia.  Patient is hypertensive initially, though this improved with analgesia, fluids. Patient's labs notable mostly for hyperglycemia, without evidence for an anion gap, or findings suspicious for sepsis/bacteremia/substantial electrolyte abnormalities. Patient presents to the, was sleeping comfortably here. Patient's blood pressure decreased.  She was discharged with analgesics, to followup with primary care for further evaluation, management, consideration of medication adjustments.    Carmin Muskrat, MD 07/30/14 2200

## 2014-07-30 NOTE — ED Notes (Signed)
Pt presents with intermittent hyperglycemia for the past 3 weeks despite insulin and medications at home.  Pt noted to have pain to right great toe and that toe has gotten darker in color.  Admits to taking all medications today.

## 2014-07-30 NOTE — Discharge Instructions (Signed)
As discussed, it is important that you follow up as soon as possible with your physician for continued management of your condition.  Is the sure to discuss your pain medication for your foot pain, as well as your insulin dosing.    If you develop any new, or concerning changes in your condition, please return to the emergency department immediately.

## 2014-07-30 NOTE — ED Notes (Signed)
Patient in NAD at time of d/c

## 2014-08-05 ENCOUNTER — Other Ambulatory Visit: Payer: Self-pay

## 2014-08-21 ENCOUNTER — Ambulatory Visit: Payer: Self-pay

## 2014-08-28 ENCOUNTER — Ambulatory Visit: Payer: Self-pay | Attending: Internal Medicine

## 2014-08-28 DIAGNOSIS — E1110 Type 2 diabetes mellitus with ketoacidosis without coma: Secondary | ICD-10-CM

## 2014-08-28 DIAGNOSIS — E1310 Other specified diabetes mellitus with ketoacidosis without coma: Secondary | ICD-10-CM

## 2014-08-28 LAB — GLUCOSE, POCT (MANUAL RESULT ENTRY): POC Glucose: 245 mg/dl — AB (ref 70–99)

## 2014-08-28 NOTE — Progress Notes (Unsigned)
Pt comes in today for blood sugar recheck with insulin adjustment last visit by Dr. Annitta Needs Pt was told to increase Levemir to 45 units at bedtime with Novolog sliding scale to control blood sugars Pt states she only injects 40 units and doesn't take Novolog due to needle sensitivity CBG- 245 with eating breakfast eggs/turkey bacon C/o dizziness with headache- BP 155/87 90

## 2014-08-28 NOTE — Patient Instructions (Signed)
Take 45 units Levemir twice a day as ordered with Novolg for sliding scale Record blood sugars daily and return to clinic next Friday with log

## 2014-10-02 ENCOUNTER — Telehealth: Payer: Self-pay | Admitting: Emergency Medicine

## 2014-10-02 ENCOUNTER — Telehealth: Payer: Self-pay | Admitting: Internal Medicine

## 2014-10-02 NOTE — Telephone Encounter (Signed)
Left message for pt to call nurse line on Monday to schedule CBG visit

## 2014-10-02 NOTE — Telephone Encounter (Signed)
Patient calling to schedule lab appointment CBG. Patient states that she was told on her last visit (08/28/14) by the nurse that she would be contacted (by nurse) with appointment information on when to return for more CBG lab work. I informed patient that I could schedule appointment now, because their are available appointments for lab. But because I am not aware of the conversation that the nurse (who's name she couldn't provide) and patient had, I did not schedule the appointment. I informed patient that I will be forwarding message to nurse for clarification. I also transferred patient to nurse line. Please review patients chart to see if a CBG lab is needed. If so I will be more than happy to schedule. Thanks.

## 2014-12-01 ENCOUNTER — Encounter: Payer: Self-pay | Admitting: Internal Medicine

## 2014-12-01 ENCOUNTER — Ambulatory Visit: Payer: Self-pay | Attending: Internal Medicine | Admitting: Internal Medicine

## 2014-12-01 VITALS — BP 158/88 | HR 99 | Temp 98.5°F | Resp 16 | Wt 211.2 lb

## 2014-12-01 DIAGNOSIS — R252 Cramp and spasm: Secondary | ICD-10-CM

## 2014-12-01 DIAGNOSIS — E139 Other specified diabetes mellitus without complications: Secondary | ICD-10-CM

## 2014-12-01 DIAGNOSIS — I10 Essential (primary) hypertension: Secondary | ICD-10-CM | POA: Insufficient documentation

## 2014-12-01 DIAGNOSIS — E119 Type 2 diabetes mellitus without complications: Secondary | ICD-10-CM | POA: Insufficient documentation

## 2014-12-01 LAB — COMPLETE METABOLIC PANEL WITH GFR
ALBUMIN: 3.5 g/dL (ref 3.5–5.2)
ALT: 12 U/L (ref 0–35)
AST: 11 U/L (ref 0–37)
Alkaline Phosphatase: 102 U/L (ref 39–117)
BUN: 14 mg/dL (ref 6–23)
CHLORIDE: 98 meq/L (ref 96–112)
CO2: 28 meq/L (ref 19–32)
Calcium: 9 mg/dL (ref 8.4–10.5)
Creat: 0.67 mg/dL (ref 0.50–1.10)
GLUCOSE: 307 mg/dL — AB (ref 70–99)
POTASSIUM: 3.9 meq/L (ref 3.5–5.3)
SODIUM: 134 meq/L — AB (ref 135–145)
Total Bilirubin: 0.7 mg/dL (ref 0.2–1.2)
Total Protein: 6.3 g/dL (ref 6.0–8.3)

## 2014-12-01 LAB — POCT GLYCOSYLATED HEMOGLOBIN (HGB A1C): Hemoglobin A1C: >15

## 2014-12-01 LAB — GLUCOSE, POCT (MANUAL RESULT ENTRY): POC GLUCOSE: 277 mg/dL — AB (ref 70–99)

## 2014-12-01 MED ORDER — CHLORHEXIDINE GLUCONATE 0.12 % MT SOLN: 15.0000 mL | Freq: Two times a day (BID) | OROMUCOSAL | Status: DC

## 2014-12-01 MED ORDER — AMLODIPINE BESYLATE 10 MG PO TABS: 10.0000 mg | ORAL_TABLET | Freq: Every day | ORAL | Status: DC

## 2014-12-01 MED ORDER — INSULIN DETEMIR 100 UNIT/ML ~~LOC~~ SOLN: 50.0000 [IU] | Freq: Every day | SUBCUTANEOUS | Status: DC

## 2014-12-01 MED ORDER — DAPAGLIFLOZIN PROPANEDIOL 10 MG PO TABS
10.0000 mg | ORAL_TABLET | Freq: Every day | ORAL | Status: DC
Start: 2014-12-01 — End: 2014-12-21

## 2014-12-01 MED ORDER — METFORMIN HCL 500 MG PO TABS: 1000.0000 mg | ORAL_TABLET | Freq: Two times a day (BID) | ORAL | Status: DC

## 2014-12-01 MED ORDER — GABAPENTIN 300 MG PO CAPS: 300.0000 mg | ORAL_CAPSULE | Freq: Three times a day (TID) | ORAL | Status: DC

## 2014-12-01 NOTE — Patient Instructions (Signed)
Diabetes Mellitus and Food It is important for you to manage your blood sugar (glucose) level. Your blood glucose level can be greatly affected by what you eat. Eating healthier foods in the appropriate amounts throughout the day at about the same time each day will help you control your blood glucose level. It can also help slow or prevent worsening of your diabetes mellitus. Healthy eating may even help you improve the level of your blood pressure and reach or maintain a healthy weight.  HOW CAN FOOD AFFECT ME? Carbohydrates Carbohydrates affect your blood glucose level more than any other type of food. Your dietitian will help you determine how many carbohydrates to eat at each meal and teach you how to count carbohydrates. Counting carbohydrates is important to keep your blood glucose at a healthy level, especially if you are using insulin or taking certain medicines for diabetes mellitus. Alcohol Alcohol can cause sudden decreases in blood glucose (hypoglycemia), especially if you use insulin or take certain medicines for diabetes mellitus. Hypoglycemia can be a life-threatening condition. Symptoms of hypoglycemia (sleepiness, dizziness, and disorientation) are similar to symptoms of having too much alcohol.  If your health care provider has given you approval to drink alcohol, do so in moderation and use the following guidelines:  Women should not have more than one drink per day, and men should not have more than two drinks per day. One drink is equal to:  12 oz of beer.  5 oz of wine.  1 oz of hard liquor.  Do not drink on an empty stomach.  Keep yourself hydrated. Have water, diet soda, or unsweetened iced tea.  Regular soda, juice, and other mixers might contain a lot of carbohydrates and should be counted. WHAT FOODS ARE NOT RECOMMENDED? As you make food choices, it is important to remember that all foods are not the same. Some foods have fewer nutrients per serving than other  foods, even though they might have the same number of calories or carbohydrates. It is difficult to get your body what it needs when you eat foods with fewer nutrients. Examples of foods that you should avoid that are high in calories and carbohydrates but low in nutrients include:  Trans fats (most processed foods list trans fats on the Nutrition Facts label).  Regular soda.  Juice.  Candy.  Sweets, such as cake, pie, doughnuts, and cookies.  Fried foods. WHAT FOODS CAN I EAT? Have nutrient-rich foods, which will nourish your body and keep you healthy. The food you should eat also will depend on several factors, including:  The calories you need.  The medicines you take.  Your weight.  Your blood glucose level.  Your blood pressure level.  Your cholesterol level. You also should eat a variety of foods, including:  Protein, such as meat, poultry, fish, tofu, nuts, and seeds (lean animal proteins are best).  Fruits.  Vegetables.  Dairy products, such as milk, cheese, and yogurt (low fat is best).  Breads, grains, pasta, cereal, rice, and beans.  Fats such as olive oil, trans fat-free margarine, canola oil, avocado, and olives. DOES EVERYONE WITH DIABETES MELLITUS HAVE THE SAME MEAL PLAN? Because every person with diabetes mellitus is different, there is not one meal plan that works for everyone. It is very important that you meet with a dietitian who will help you create a meal plan that is just right for you. Document Released: 08/03/2005 Document Revised: 11/11/2013 Document Reviewed: 10/03/2013 [employer] Patient Information 2015 [employer]. This   information is not intended to replace advice given to you by your health care provider. Make sure you discuss any questions you have with your health care provider. DASH Eating Plan DASH stands for "Dietary Approaches to Stop Hypertension." The DASH eating plan is a healthy eating plan that has been shown to reduce high  blood pressure (hypertension). Additional health benefits may include reducing the risk of type 2 diabetes mellitus, heart disease, and stroke. The DASH eating plan may also help with weight loss. WHAT DO I NEED TO KNOW ABOUT THE DASH EATING PLAN? For the DASH eating plan, you will follow these general guidelines:  Choose foods with a percent daily value for sodium of less than 5% (as listed on the food label).  Use salt-free seasonings or herbs instead of table salt or sea salt.  Check with your health care provider or pharmacist before using salt substitutes.  Eat lower-sodium products, often labeled as "lower sodium" or "no salt added."  Eat fresh foods.  Eat more vegetables, fruits, and low-fat dairy products.  Choose whole grains. Look for the word "whole" as the first word in the ingredient list.  Choose fish and skinless chicken or turkey more often than red meat. Limit fish, poultry, and meat to 6 oz (170 g) each day.  Limit sweets, desserts, sugars, and sugary drinks.  Choose heart-healthy fats.  Limit cheese to 1 oz (28 g) per day.  Eat more home-cooked food and less restaurant, buffet, and fast food.  Limit fried foods.  Cook foods using methods other than frying.  Limit canned vegetables. If you do use them, rinse them well to decrease the sodium.  When eating at a restaurant, ask that your food be prepared with less salt, or no salt if possible. WHAT FOODS CAN I EAT? Seek help from a dietitian for individual calorie needs. Grains Whole grain or whole wheat bread. Brown rice. Whole grain or whole wheat pasta. Quinoa, bulgur, and whole grain cereals. Low-sodium cereals. Corn or whole wheat flour tortillas. Whole grain cornbread. Whole grain crackers. Low-sodium crackers. Vegetables Fresh or frozen vegetables (raw, steamed, roasted, or grilled). Low-sodium or reduced-sodium tomato and vegetable juices. Low-sodium or reduced-sodium tomato sauce and paste. Low-sodium  or reduced-sodium canned vegetables.  Fruits All fresh, canned (in natural juice), or frozen fruits. Meat and Other Protein Products Ground beef (85% or leaner), grass-fed beef, or beef trimmed of fat. Skinless chicken or turkey. Ground chicken or turkey. Pork trimmed of fat. All fish and seafood. Eggs. Dried beans, peas, or lentils. Unsalted nuts and seeds. Unsalted canned beans. Dairy Low-fat dairy products, such as skim or 1% milk, 2% or reduced-fat cheeses, low-fat ricotta or cottage cheese, or plain low-fat yogurt. Low-sodium or reduced-sodium cheeses. Fats and Oils Tub margarines without trans fats. Light or reduced-fat mayonnaise and salad dressings (reduced sodium). Avocado. Safflower, olive, or canola oils. Natural peanut or almond butter. Other Unsalted popcorn and pretzels. The items listed above may not be a complete list of recommended foods or beverages. Contact your dietitian for more options. WHAT FOODS ARE NOT RECOMMENDED? Grains White bread. White pasta. White rice. Refined cornbread. Bagels and croissants. Crackers that contain trans fat. Vegetables Creamed or fried vegetables. Vegetables in a cheese sauce. Regular canned vegetables. Regular canned tomato sauce and paste. Regular tomato and vegetable juices. Fruits Dried fruits. Canned fruit in light or heavy syrup. Fruit juice. Meat and Other Protein Products Fatty cuts of meat. Ribs, chicken wings, bacon, sausage, bologna, salami, chitterlings, fatback, hot   dogs, bratwurst, and packaged luncheon meats. Salted nuts and seeds. Canned beans with salt. Dairy Whole or 2% milk, cream, half-and-half, and cream cheese. Whole-fat or sweetened yogurt. Full-fat cheeses or blue cheese. Nondairy creamers and whipped toppings. Processed cheese, cheese spreads, or cheese curds. Condiments Onion and garlic salt, seasoned salt, table salt, and sea salt. Canned and packaged gravies. Worcestershire sauce. Tartar sauce. Barbecue sauce.  Teriyaki sauce. Soy sauce, including reduced sodium. Steak sauce. Fish sauce. Oyster sauce. Cocktail sauce. Horseradish. Ketchup and mustard. Meat flavorings and tenderizers. Bouillon cubes. Hot sauce. Tabasco sauce. Marinades. Taco seasonings. Relishes. Fats and Oils Butter, stick margarine, lard, shortening, ghee, and bacon fat. Coconut, palm kernel, or palm oils. Regular salad dressings. Other Pickles and olives. Salted popcorn and pretzels. The items listed above may not be a complete list of foods and beverages to avoid. Contact your dietitian for more information. WHERE CAN I FIND MORE INFORMATION? National [employer], [employer], [employer]: www.nhlbi.nih.gov/health/health-topics/topics/dash/ Document Released: 10/26/2011 Document Revised: 03/23/2014 Document Reviewed: 09/10/2013 [employer] Patient Information 2015 [employer]. This information is not intended to replace advice given to you by your health care provider. Make sure you discuss any questions you have with your health care provider.  

## 2014-12-01 NOTE — Progress Notes (Signed)
MRN: [id number] Name: Katherine [name]  Sex: female Age: 59 y.o. DOB: 1956-07-12  Allergies: Other  Chief Complaint  Patient presents with  . Follow-up    HPI: Patient is 59 y.o. female who history of diabetes hypertension comes today for followup as per patient she is taking insulin Levemir her 45 units twice a day as well as metformin thousand milligram twice a day, as per patient her fasting blood sugar is usually more than 200 mg/dL, today her blood pressure is also more than 200, today noticed her hemoglobin A1c is more than 15% she denies any blurry vision dry mouth, I have counseled patient for diabetes meal planning and we have increased the dose of medication, patient also has history of neuropathy and is on Neurontin. Patient is requesting refill on her medications.  Past Medical History  Diagnosis Date  . Diabetes mellitus   . Hypertension   . Boil of buttock     Past Surgical History  Procedure Laterality Date  . Appendectomy        Medication List       This list is accurate as of: 12/01/14  1:19 PM.  Always use your most recent med list.               amLODipine 10 MG tablet  Commonly known as:  NORVASC  Take 1 tablet (10 mg total) by mouth daily.     cephALEXin 500 MG capsule  Commonly known as:  KEFLEX  Take 1 capsule (500 mg total) by mouth 4 (four) times daily.     dapagliflozin propanediol 10 MG Tabs tablet  Commonly known as:  FARXIGA  Take 10 mg by mouth daily.     gabapentin 300 MG capsule  Commonly known as:  NEURONTIN  Take 1 capsule (300 mg total) by mouth 3 (three) times daily.     HYDROcodone-acetaminophen 5-325 MG per tablet  Commonly known as:  NORCO/VICODIN  Take 1 tablet by mouth every 6 (six) hours as needed for severe pain.     insulin aspart 100 UNIT/ML injection  Commonly known as:  novoLOG  - Inject 0-5 Units into the skin at bedtime. CBG 70 - 120: 0 units  - CBG 121 - 150: 0 units  - CBG 151 - 200: 0 units  - CBG  201 - 250: 2 units  - CBG 251 - 300: 3 units  - CBG 301 - 350: 4 units  - CBG 351 - 400: 5 units     insulin detemir 100 UNIT/ML injection  Commonly known as:  LEVEMIR  Inject 0.5 mLs (50 Units total) into the skin at bedtime.     metFORMIN 500 MG tablet  Commonly known as:  GLUCOPHAGE  Take 2 tablets (1,000 mg total) by mouth 2 (two) times daily. Patient states she takes 1000mg  twice a day per patient     multivitamin with minerals Tabs tablet  Take 1 tablet by mouth daily.     olmesartan-hydrochlorothiazide 20-12.5 MG per tablet  Commonly known as:  BENICAR HCT  Take 1 tablet by mouth 2 (two) times daily.     promethazine 25 MG tablet  Commonly known as:  PHENERGAN  Take 1 tablet (25 mg total) by mouth every 8 (eight) hours as needed for nausea or vomiting.     terbinafine 250 MG tablet  Commonly known as:  LAMISIL  Take 1 tablet (250 mg total) by mouth daily.  Meds ordered this encounter  Medications  . amLODipine (NORVASC) 10 MG tablet    Sig: Take 1 tablet (10 mg total) by mouth daily.    Dispense:  30 tablet    Refill:  2  . gabapentin (NEURONTIN) 300 MG capsule    Sig: Take 1 capsule (300 mg total) by mouth 3 (three) times daily.    Dispense:  90 capsule    Refill:  2  . insulin detemir (LEVEMIR) 100 UNIT/ML injection    Sig: Inject 0.5 mLs (50 Units total) into the skin at bedtime.    Dispense:  10 mL    Refill:  11  . metFORMIN (GLUCOPHAGE) 500 MG tablet    Sig: Take 2 tablets (1,000 mg total) by mouth 2 (two) times daily. Patient states she takes 1000mg  twice a day per patient    Dispense:  120 tablet    Refill:  2  . dapagliflozin propanediol (FARXIGA) 10 MG TABS tablet    Sig: Take 10 mg by mouth daily.    Dispense:  30 tablet    Refill:  3    Immunization History  Administered Date(s) Administered  . Influenza Split 01/13/2013  . Influenza Whole 10/28/2008, 09/10/2009  . PPD Test 02/02/2014  . Pneumococcal Polysaccharide-23 07/14/2008,  06/23/2014  . Td 10/21/2009    Family History  Problem Relation Age of Onset  . Diabetes Father   . Hypertension Father   . Cancer Maternal Grandmother   . Hypertension Maternal Grandfather     History  Substance Use Topics  . Smoking status: Never Smoker   . Smokeless tobacco: Not on file  . Alcohol Use: No    Review of Systems   As noted in HPI  Filed Vitals:   12/01/14 1237  BP: 158/88  Pulse: 99  Temp: 98.5 F (36.9 C)  Resp: 16    Physical Exam  Physical Exam  Constitutional: No distress.  Eyes: EOM are normal. Pupils are equal, round, and reactive to light.  Cardiovascular: Normal rate and regular rhythm.   Pulmonary/Chest: Breath sounds normal. No respiratory distress. She has no wheezes. She has no rales.  Musculoskeletal: She exhibits no edema.    CBC    Component Value Date/Time   WBC 5.5 07/30/2014 1825   RBC 4.52 07/30/2014 1825   HGB 13.2 07/30/2014 1825   HCT 39.6 07/30/2014 1825   PLT 223 07/30/2014 1825   MCV 87.6 07/30/2014 1825   LYMPHSABS 2.3 07/30/2014 1825   MONOABS 0.3 07/30/2014 1825   EOSABS 0.2 07/30/2014 1825   BASOSABS 0.0 07/30/2014 1825    CMP     Component Value Date/Time   NA 144 07/30/2014 1825   K 3.7 07/30/2014 1825   CL 103 07/30/2014 1825   CO2 28 07/30/2014 1825   GLUCOSE 245* 07/30/2014 1825   BUN 13 07/30/2014 1825   CREATININE 0.80 07/30/2014 1825   CREATININE 0.86 12/05/2013 1133   CALCIUM 8.6 07/30/2014 1825   PROT 6.9 07/30/2014 1825   ALBUMIN 3.3* 07/30/2014 1825   AST 16 07/30/2014 1825   ALT 15 07/30/2014 1825   ALKPHOS 110 07/30/2014 1825   BILITOT 0.3 07/30/2014 1825   GFRNONAA 80* 07/30/2014 1825   GFRAA >90 07/30/2014 1825    Lab Results  Component Value Date/Time   CHOL 173 12/05/2013 11:33 AM    No components found for: HGA1C  Lab Results  Component Value Date/Time   AST 16 07/30/2014 06:25 PM    Assessment and  Plan  Other specified diabetes mellitus without complications  - Plan:  Results for orders placed or performed in visit on 12/01/14  Glucose (CBG)  Result Value Ref Range   POC Glucose 277 (A) 70 - 99 mg/dl  HgB A1c  Result Value Ref Range   Hemoglobin A1C >15.0    Diabetes is uncontrolled and her HgB A1c is greater than 15%  I have advised patient for diabetes meal planning, increased the dose ofLevemir to 50 units twice a day, continue fingerstick monitoring, I have added FARXIGA she will continue with metformin, she'll come back in 2 weeks per nurse visit CBG check , gabapentin (NEURONTIN) 300 MG capsule, insulin detemir (LEVEMIR) 100 UNIT/ML injection, metFORMIN (GLUCOPHAGE) 500 MG tablet, dapagliflozin propanediol (FARXIGA) 10 MG TABS tablet, COMPLETE METABOLIC PANEL WITH GFR  Essential hypertension - Plan:Advised patient for DASH diet, continue with current meds, will recheck BP on the next visit.  amLODipine (NORVASC) 10 MG tablet   Return in about 3 months (around 03/02/2015) for diabetes, hypertension, BP check in 2 weeks/Nurse Visit.  Lorayne Marek, MD

## 2014-12-01 NOTE — Progress Notes (Signed)
Patient here for follow up on her diabetes Patient states just took her insulin about one hour ago Patient is requesting a refill on chlorhexidine gluconate Patient also requesting that her novolog be prescribed in a flex pen

## 2014-12-11 ENCOUNTER — Emergency Department (HOSPITAL_COMMUNITY): Payer: Self-pay

## 2014-12-11 ENCOUNTER — Emergency Department (HOSPITAL_COMMUNITY)
Admission: EM | Admit: 2014-12-11 | Discharge: 2014-12-11 | Disposition: A | Discharge status: Home / Self Care | Payer: Self-pay | Attending: Emergency Medicine | Admitting: Emergency Medicine

## 2014-12-11 ENCOUNTER — Emergency Department (HOSPITAL_COMMUNITY): Payer: No Typology Code available for payment source

## 2014-12-11 ENCOUNTER — Encounter (HOSPITAL_COMMUNITY): Payer: Self-pay | Admitting: Emergency Medicine

## 2014-12-11 DIAGNOSIS — Z794 Long term (current) use of insulin: Secondary | ICD-10-CM | POA: Insufficient documentation

## 2014-12-11 DIAGNOSIS — S161XXA Strain of muscle, fascia and tendon at neck level, initial encounter: Secondary | ICD-10-CM | POA: Insufficient documentation

## 2014-12-11 DIAGNOSIS — R29898 Other symptoms and signs involving the musculoskeletal system: Secondary | ICD-10-CM

## 2014-12-11 DIAGNOSIS — S3991XA Unspecified injury of abdomen, initial encounter: Secondary | ICD-10-CM | POA: Insufficient documentation

## 2014-12-11 DIAGNOSIS — Y92410 Unspecified street and highway as the place of occurrence of the external cause: Secondary | ICD-10-CM | POA: Insufficient documentation

## 2014-12-11 DIAGNOSIS — S39012A Strain of muscle, fascia and tendon of lower back, initial encounter: Secondary | ICD-10-CM | POA: Insufficient documentation

## 2014-12-11 DIAGNOSIS — Z8659 Personal history of other mental and behavioral disorders: Secondary | ICD-10-CM | POA: Insufficient documentation

## 2014-12-11 DIAGNOSIS — I10 Essential (primary) hypertension: Secondary | ICD-10-CM | POA: Insufficient documentation

## 2014-12-11 DIAGNOSIS — S0990XA Unspecified injury of head, initial encounter: Secondary | ICD-10-CM | POA: Insufficient documentation

## 2014-12-11 DIAGNOSIS — E119 Type 2 diabetes mellitus without complications: Secondary | ICD-10-CM | POA: Insufficient documentation

## 2014-12-11 DIAGNOSIS — T1490XA Injury, unspecified, initial encounter: Secondary | ICD-10-CM

## 2014-12-11 DIAGNOSIS — Z872 Personal history of diseases of the skin and subcutaneous tissue: Secondary | ICD-10-CM | POA: Insufficient documentation

## 2014-12-11 DIAGNOSIS — S29001A Unspecified injury of muscle and tendon of front wall of thorax, initial encounter: Secondary | ICD-10-CM | POA: Insufficient documentation

## 2014-12-11 DIAGNOSIS — R531 Weakness: Secondary | ICD-10-CM | POA: Insufficient documentation

## 2014-12-11 DIAGNOSIS — Z8719 Personal history of other diseases of the digestive system: Secondary | ICD-10-CM | POA: Insufficient documentation

## 2014-12-11 DIAGNOSIS — Z79899 Other long term (current) drug therapy: Secondary | ICD-10-CM | POA: Insufficient documentation

## 2014-12-11 DIAGNOSIS — Y9389 Activity, other specified: Secondary | ICD-10-CM | POA: Insufficient documentation

## 2014-12-11 DIAGNOSIS — Y998 Other external cause status: Secondary | ICD-10-CM | POA: Insufficient documentation

## 2014-12-11 LAB — CBG MONITORING, ED: Glucose-Capillary: 181 mg/dL — ABNORMAL HIGH (ref 70–99)

## 2014-12-11 LAB — I-STAT CHEM 8, ED
BUN: 9 mg/dL (ref 6–23)
CREATININE: 0.6 mg/dL (ref 0.50–1.10)
Calcium, Ion: 1.12 mmol/L (ref 1.12–1.23)
Chloride: 100 mmol/L (ref 96–112)
Glucose, Bld: 409 mg/dL — ABNORMAL HIGH (ref 70–99)
HEMATOCRIT: 49 % — AB (ref 36.0–46.0)
Hemoglobin: 16.7 g/dL — ABNORMAL HIGH (ref 12.0–15.0)
POTASSIUM: 3.8 mmol/L (ref 3.5–5.1)
Sodium: 137 mmol/L (ref 135–145)
TCO2: 23 mmol/L (ref 0–100)

## 2014-12-11 LAB — CBC
HCT: 43.1 % (ref 36.0–46.0)
HEMOGLOBIN: 15 g/dL (ref 12.0–15.0)
MCH: 29.2 pg (ref 26.0–34.0)
MCHC: 34.8 g/dL (ref 30.0–36.0)
MCV: 83.9 fL (ref 78.0–100.0)
Platelets: 204 10*3/uL (ref 150–400)
RBC: 5.14 MIL/uL — ABNORMAL HIGH (ref 3.87–5.11)
RDW: 12.8 % (ref 11.5–15.5)
WBC: 5 10*3/uL (ref 4.0–10.5)

## 2014-12-11 LAB — COMPREHENSIVE METABOLIC PANEL
ALK PHOS: 116 U/L (ref 39–117)
ALT: 15 U/L (ref 0–35)
AST: 23 U/L (ref 0–37)
Albumin: 3.7 g/dL (ref 3.5–5.2)
Anion gap: 12 (ref 5–15)
BILIRUBIN TOTAL: 0.7 mg/dL (ref 0.3–1.2)
BUN: 8 mg/dL (ref 6–23)
CO2: 24 mmol/L (ref 19–32)
CREATININE: 0.81 mg/dL (ref 0.50–1.10)
Calcium: 9.2 mg/dL (ref 8.4–10.5)
Chloride: 99 mmol/L (ref 96–112)
GFR calc Af Amer: >90 mL/min (ref >90)
GFR, EST NON AFRICAN AMERICAN: 79 mL/min — AB (ref >90)
Glucose, Bld: 401 mg/dL — ABNORMAL HIGH (ref 70–99)
Potassium: 3.8 mmol/L (ref 3.5–5.1)
Sodium: 135 mmol/L (ref 135–145)
TOTAL PROTEIN: 7.1 g/dL (ref 6.0–8.3)

## 2014-12-11 LAB — ETHANOL: Alcohol, Ethyl (B): <5 mg/dL (ref 0–9)

## 2014-12-11 LAB — PROTIME-INR
INR: 1.05 (ref 0.00–1.49)
PROTHROMBIN TIME: 13.9 s (ref 11.6–15.2)

## 2014-12-11 MED ORDER — SODIUM CHLORIDE 0.9 % IV BOLUS (SEPSIS)
1000.0000 mL | Freq: Once | INTRAVENOUS | Status: AC
  Administered 2014-12-11: 1000 mL via INTRAVENOUS

## 2014-12-11 MED ORDER — DIAZEPAM 5 MG PO TABS: 5.0000 mg | ORAL_TABLET | Freq: Four times a day (QID) | ORAL | Status: DC | PRN

## 2014-12-11 MED ORDER — FENTANYL CITRATE 0.05 MG/ML IJ SOLN
50.0000 ug | Freq: Once | INTRAMUSCULAR | Status: AC
  Administered 2014-12-11: 50 ug via INTRAVENOUS
  Filled 2014-12-11: qty 2

## 2014-12-11 MED ORDER — INSULIN ASPART 100 UNIT/ML ~~LOC~~ SOLN
9.0000 [IU] | Freq: Once | SUBCUTANEOUS | Status: AC
  Administered 2014-12-11: 9 [IU] via INTRAVENOUS
  Filled 2014-12-11: qty 1

## 2014-12-11 MED ORDER — OXYCODONE-ACETAMINOPHEN 5-325 MG PO TABS: 1.0000 | ORAL_TABLET | ORAL | Status: DC | PRN

## 2014-12-11 MED ORDER — MORPHINE SULFATE 4 MG/ML IJ SOLN
4.0000 mg | Freq: Once | INTRAMUSCULAR | Status: AC
Start: 2014-12-11 — End: 2014-12-11
  Administered 2014-12-11: 4 mg via INTRAVENOUS
  Filled 2014-12-11: qty 1

## 2014-12-11 MED ORDER — LABETALOL HCL 5 MG/ML IV SOLN
10.0000 mg | Freq: Once | INTRAVENOUS | Status: DC
  Filled 2014-12-11: qty 4

## 2014-12-11 MED ORDER — IOHEXOL 300 MG/ML  SOLN
100.0000 mL | Freq: Once | INTRAMUSCULAR | Status: AC | PRN
  Administered 2014-12-11: 100 mL via INTRAVENOUS

## 2014-12-11 NOTE — ED Notes (Signed)
MRI sts one pt on table and one before pt- appx wait time is 1 hour. Pt updated on delay.

## 2014-12-11 NOTE — ED Notes (Signed)
Patient ambulated in hallways without difficulty.

## 2014-12-11 NOTE — ED Notes (Signed)
Portable at bedside 
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
## 2014-12-11 NOTE — ED Notes (Signed)
Dr. Gentry at bedside. 

## 2014-12-11 NOTE — ED Notes (Signed)
MRI notified pt has had CT scans and is ready for MRI

## 2014-12-17 ENCOUNTER — Ambulatory Visit: Payer: Self-pay

## 2014-12-17 ENCOUNTER — Ambulatory Visit: Payer: Self-pay | Attending: Internal Medicine | Admitting: *Deleted

## 2014-12-17 VITALS — BP 160/65 | HR 69 | Temp 98.3°F | Resp 14

## 2014-12-17 DIAGNOSIS — E089 Diabetes mellitus due to underlying condition without complications: Secondary | ICD-10-CM

## 2014-12-17 DIAGNOSIS — Z794 Long term (current) use of insulin: Secondary | ICD-10-CM | POA: Insufficient documentation

## 2014-12-17 DIAGNOSIS — E119 Type 2 diabetes mellitus without complications: Secondary | ICD-10-CM | POA: Insufficient documentation

## 2014-12-17 DIAGNOSIS — E1165 Type 2 diabetes mellitus with hyperglycemia: Secondary | ICD-10-CM

## 2014-12-17 DIAGNOSIS — R3915 Urgency of urination: Secondary | ICD-10-CM

## 2014-12-17 DIAGNOSIS — Z23 Encounter for immunization: Secondary | ICD-10-CM

## 2014-12-17 DIAGNOSIS — I10 Essential (primary) hypertension: Secondary | ICD-10-CM

## 2014-12-17 LAB — GLUCOSE, POCT (MANUAL RESULT ENTRY)
POC Glucose: 385 mg/dl — AB (ref 70–99)
POC Glucose: 357 mg/dl — AB (ref 70–99)

## 2014-12-17 LAB — POCT URINALYSIS DIPSTICK
Bilirubin, UA: NEGATIVE
GLUCOSE UA: 500
Ketones, UA: NEGATIVE
LEUKOCYTES UA: NEGATIVE
Nitrite, UA: POSITIVE
Protein, UA: 30
UROBILINOGEN UA: 0.2
pH, UA: 5.5

## 2014-12-17 MED ORDER — INSULIN DETEMIR 100 UNIT/ML ~~LOC~~ SOLN: 50.0000 [IU] | Freq: Two times a day (BID) | SUBCUTANEOUS | Status: DC

## 2014-12-17 MED ORDER — INSULIN ASPART 100 UNIT/ML ~~LOC~~ SOLN
20.0000 [IU] | Freq: Once | SUBCUTANEOUS | Status: AC
  Administered 2014-12-17: 20 [IU] via SUBCUTANEOUS

## 2014-12-17 MED ORDER — LOSARTAN POTASSIUM 25 MG PO TABS: 25.0000 mg | ORAL_TABLET | Freq: Every day | ORAL | Status: DC

## 2014-12-17 MED ORDER — CIPROFLOXACIN HCL 500 MG PO TABS: 500.0000 mg | ORAL_TABLET | Freq: Two times a day (BID) | ORAL | Status: AC

## 2014-12-17 NOTE — Patient Instructions (Signed)
Basic Carbohydrate Counting for Diabetes Mellitus Carbohydrate counting is a method for keeping track of the amount of carbohydrates you eat. Eating carbohydrates naturally increases the level of sugar (glucose) in your blood, so it is important for you to know the amount that is okay for you to have in every meal. Carbohydrate counting helps keep the level of glucose in your blood within normal limits. The amount of carbohydrates allowed is different for every person. A dietitian can help you calculate the amount that is right for you. Once you know the amount of carbohydrates you can have, you can count the carbohydrates in the foods you want to eat. Carbohydrates are found in the following foods:  Grains, such as breads and cereals.  Dried beans and soy products.  Starchy vegetables, such as potatoes, peas, and corn.  Diabetes Mellitus and Food It is important for you to manage your blood sugar (glucose) level. Your blood glucose level can be greatly affected by what you eat. Eating healthier foods in the appropriate amounts throughout the day at about the same time each day will help you control your blood glucose level. It can also help slow or prevent worsening of your diabetes mellitus. Healthy eating may even help you improve the level of your blood pressure and reach or maintain a healthy weight.  HOW CAN FOOD AFFECT ME? Carbohydrates Carbohydrates affect your blood glucose level more than any other type of food. Your dietitian will help you determine how many carbohydrates to eat at each meal and teach you how to count carbohydrates. Counting carbohydrates is important to keep your blood glucose at a healthy level, especially if you are using insulin or taking certain medicines for diabetes mellitus. Alcohol Alcohol can cause sudden decreases in blood glucose (hypoglycemia), especially if you use insulin or take certain medicines for diabetes mellitus. Hypoglycemia can be a life-threatening  condition. Symptoms of hypoglycemia (sleepiness, dizziness, and disorientation) are similar to symptoms of having too much alcohol.  If your health care provider has given you approval to drink alcohol, do so in moderation and use the following guidelines: Women should not have more than one drink per day, and men should not have more than two drinks per day. One drink is equal to: 12 oz of beer. 5 oz of wine. 1 oz of hard liquor. Do not drink on an empty stomach. Keep yourself hydrated. Have water, diet soda, or unsweetened iced tea. Regular soda, juice, and other mixers might contain a lot of carbohydrates and should be counted. WHAT FOODS ARE NOT RECOMMENDED? As you make food choices, it is important to remember that all foods are not the same. Some foods have fewer nutrients per serving than other foods, even though they might have the same number of calories or carbohydrates. It is difficult to get your body what it needs when you eat foods with fewer nutrients. Examples of foods that you should avoid that are high in calories and carbohydrates but low in nutrients include: Trans fats (most processed foods list trans fats on the Nutrition Facts label). Regular soda. Juice. Candy. Sweets, such as cake, pie, doughnuts, and cookies. Fried foods. WHAT FOODS CAN I EAT? Have nutrient-rich foods, which will nourish your body and keep you healthy. The food you should eat also will depend on several factors, including: The calories you need. The medicines you take. Your weight. Your blood glucose level. Your blood pressure level. Your cholesterol level. You also should eat a variety of foods, including:  Protein, such as meat, poultry, fish, tofu, nuts, and seeds (lean animal proteins are best). Fruits. Vegetables. Dairy products, such as milk, cheese, and yogurt (low fat is best). Breads, grains, pasta, cereal, rice, and beans. Fats such as olive oil, trans fat-free margarine, canola oil,  avocado, and olives. DOES EVERYONE WITH DIABETES MELLITUS HAVE THE SAME MEAL PLAN? Because every person with diabetes mellitus is different, there is not one meal plan that works for everyone. It is very important that you meet with a dietitian who will help you create a meal plan that is just right for you. Document Released: 08/03/2005 Document Revised: 11/11/2013 Document Reviewed: 10/03/2013 [facility] Patient Information 2015 [place], [place]. This information is not intended to replace advice given to you by your health care provider. Make sure you discuss any questions you have with your health care provider. Diabetes and Exercise Exercising regularly is important. It is not just about losing weight. It has many health benefits, such as: Improving your overall fitness, flexibility, and endurance. Increasing your bone density. Helping with weight control. Decreasing your body fat. Increasing your muscle strength. Reducing stress and tension. Improving your overall health. People with diabetes who exercise gain additional benefits because exercise: Reduces appetite. Improves the body's use of blood sugar (glucose). Helps lower or control blood glucose. Decreases blood pressure. Helps control blood lipids (such as cholesterol and triglycerides). Improves the body's use of the hormone insulin by: Increasing the body's insulin sensitivity. Reducing the body's insulin needs. Decreases the risk for heart disease because exercising: Lowers cholesterol and triglycerides levels. Increases the levels of good cholesterol (such as high-density lipoproteins [HDL]) in the body. Lowers blood glucose levels. YOUR ACTIVITY PLAN  Choose an activity that you enjoy and set realistic goals. Your health care provider or diabetes educator can help you make an activity plan that works for you. Exercise regularly as directed by your health care provider. This includes: Performing resistance training twice a  week such as push-ups, sit-ups, lifting weights, or using resistance bands. Performing 150 minutes of cardio exercises each week such as walking, running, or playing sports. Staying active and spending no more than 90 minutes at one time being inactive. Even short bursts of exercise are good for you. Three 10-minute sessions spread throughout the day are just as beneficial as a single 30-minute session. Some exercise ideas include: Taking the dog for a walk. Taking the stairs instead of the elevator. Dancing to your favorite song. Doing an exercise video. Doing your favorite exercise with a friend. RECOMMENDATIONS FOR EXERCISING WITH TYPE 1 OR TYPE 2 DIABETES  Check your blood glucose before exercising. If blood glucose levels are greater than 240 mg/dL, check for urine ketones. Do not exercise if ketones are present. Avoid injecting insulin into areas of the body that are going to be exercised. For example, avoid injecting insulin into: The arms when playing tennis. The legs when jogging. Keep a record of: Food intake before and after you exercise. Expected peak times of insulin action. Blood glucose levels before and after you exercise. The type and amount of exercise you have done. Review your records with your health care provider. Your health care provider will help you to develop guidelines for adjusting food intake and insulin amounts before and after exercising. If you take insulin or oral hypoglycemic agents, watch for signs and symptoms of hypoglycemia. They include: Dizziness. Shaking. Sweating. Chills. Confusion. Drink plenty of water while you exercise to prevent dehydration or heat stroke. Body water is  lost during exercise and must be replaced. Talk to your health care provider before starting an exercise program to make sure it is safe for you. Remember, almost any type of activity is better than none. Document Released: 01/27/2004 Document Revised: 03/23/2014 Document  Reviewed: 04/15/2013 [facility] Patient Information 2015 [place], [place]. This information is not intended to replace advice given to you by your health care provider. Make sure you discuss any questions you have with your health care provider.    Fruit and fruit juices.  Milk and yogurt.  Sweets and snack foods, such as cake, cookies, candy, chips, soft drinks, and fruit drinks. CARBOHYDRATE COUNTING There are two ways to count the carbohydrates in your food. You can use either of the methods or a combination of both. Reading the "Nutrition Facts" on [place] The "Nutrition Facts" is an area that is included on the labels of almost all packaged food and beverages in the [place]. It includes the serving size of that food or beverage and information about the nutrients in each serving of the food, including the grams (g) of carbohydrate per serving.  Decide the number of servings of this food or beverage that you will be able to eat or drink. Multiply that number of servings by the number of grams of carbohydrate that is listed on the label for that serving. The total will be the amount of carbohydrates you will be having when you eat or drink this food or beverage. Learning Standard Serving Sizes of Food When you eat food that is not packaged or does not include "Nutrition Facts" on the label, you need to measure the servings in order to count the amount of carbohydrates.A serving of most carbohydrate-rich foods contains about 15 g of carbohydrates. The following list includes serving sizes of carbohydrate-rich foods that provide 15 g ofcarbohydrate per serving:   1 slice of bread (1 oz) or 1 six-inch tortilla.    of a hamburger bun or English muffin.  4-6 crackers.   cup unsweetened dry cereal.    cup hot cereal.   cup rice or pasta.    cup mashed potatoes or  of a large baked potato.  1 cup fresh fruit or one small piece of fruit.    cup canned or frozen  fruit or fruit juice.  1 cup milk.   cup plain fat-free yogurt or yogurt sweetened with artificial sweeteners.   cup cooked dried beans or starchy vegetable, such as peas, corn, or potatoes.  Decide the number of standard-size servings that you will eat. Multiply that number of servings by 15 (the grams of carbohydrates in that serving). For example, if you eat 2 cups of strawberries, you will have eaten 2 servings and 30 g of carbohydrates (2 servings x 15 g = 30 g). For foods such as soups and casseroles, in which more than one food is mixed in, you will need to count the carbohydrates in each food that is included. EXAMPLE OF CARBOHYDRATE COUNTING Sample Dinner  3 oz chicken breast.   cup of brown rice.   cup of corn.  1 cup milk.   1 cup strawberries with sugar-free whipped topping.  Carbohydrate Calculation Step 1: Identify the foods that contain carbohydrates:   Rice.   Corn.   Milk.   Strawberries. Step 2:Calculate the number of servings eaten of each:   2 servings of rice.   1 serving of corn.   1 serving of milk.   1 serving of  strawberries. Step 3: Multiply each of those number of servings by 15 g:   2 servings of rice x 15 g = 30 g.   1 serving of corn x 15 g = 15 g.   1 serving of milk x 15 g = 15 g.   1 serving of strawberries x 15 g = 15 g. Step 4: Add together all of the amounts to find the total grams of carbohydrates eaten: 30 g + 15 g + 15 g + 15 g = 75 g. Document Released: 11/06/2005 Document Revised: 03/23/2014 Document Reviewed: 10/03/2013 [facility] Patient Information 2015 [place], [place]. This information is not intended to replace advice given to you by your health care provider. Make sure you discuss any questions you have with your health care provider. DASH Eating Plan DASH stands for "Dietary Approaches to Stop Hypertension." The DASH eating plan is a healthy eating plan that has been shown to reduce high blood pressure  (hypertension). Additional health benefits may include reducing the risk of type 2 diabetes mellitus, heart disease, and stroke. The DASH eating plan may also help with weight loss. WHAT DO I NEED TO KNOW ABOUT THE DASH EATING PLAN? For the DASH eating plan, you will follow these general guidelines:  Choose foods with a percent daily value for sodium of less than 5% (as listed on the food label).  Use salt-free seasonings or herbs instead of table salt or sea salt.  Check with your health care provider or pharmacist before using salt substitutes.  Eat lower-sodium products, often labeled as "lower sodium" or "no salt added."  Eat fresh foods.  Eat more vegetables, fruits, and low-fat dairy products.  Choose whole grains. Look for the word "whole" as the first word in the ingredient list.  Choose fish and skinless chicken or [place] more often than red meat. Limit fish, poultry, and meat to 6 oz (170 g) each day.  Limit sweets, desserts, sugars, and sugary drinks.  Choose heart-healthy fats.  Limit cheese to 1 oz (28 g) per day.  Eat more home-cooked food and less restaurant, buffet, and fast food.  Limit fried foods.  Cook foods using methods other than frying.  Limit canned vegetables. If you do use them, rinse them well to decrease the sodium.  When eating at a restaurant, ask that your food be prepared with less salt, or no salt if possible. WHAT FOODS CAN I EAT? Seek help from a dietitian for individual calorie needs. Grains Whole grain or whole wheat bread. Brown rice. Whole grain or whole wheat pasta. Quinoa, bulgur, and whole grain cereals. Low-sodium cereals. Corn or whole wheat flour tortillas. Whole grain cornbread. Whole grain crackers. Low-sodium crackers. Vegetables Fresh or frozen vegetables (raw, steamed, roasted, or grilled). Low-sodium or reduced-sodium tomato and vegetable juices. Low-sodium or reduced-sodium tomato sauce and paste. Low-sodium or  reduced-sodium canned vegetables.  Fruits All fresh, canned (in natural juice), or frozen fruits. Meat and Other Protein Products Ground beef (85% or leaner), grass-fed beef, or beef trimmed of fat. Skinless chicken or [place]. Ground chicken or [place]. Pork trimmed of fat. All fish and seafood. Eggs. Dried beans, peas, or lentils. Unsalted nuts and seeds. Unsalted canned beans. Dairy Low-fat dairy products, such as skim or 1% milk, 2% or reduced-fat cheeses, low-fat ricotta or cottage cheese, or plain low-fat yogurt. Low-sodium or reduced-sodium cheeses. Fats and Oils Tub margarines without trans fats. Light or reduced-fat mayonnaise and salad dressings (reduced sodium). Avocado. Safflower, olive, or canola oils. Natural  peanut or almond butter. Other Unsalted popcorn and pretzels. The items listed above may not be a complete list of recommended foods or beverages. Contact your dietitian for more options. WHAT FOODS ARE NOT RECOMMENDED? Grains White bread. White pasta. White rice. Refined cornbread. Bagels and croissants. Crackers that contain trans fat. Vegetables Creamed or fried vegetables. Vegetables in a cheese sauce. Regular canned vegetables. Regular canned tomato sauce and paste. Regular tomato and vegetable juices. Fruits Dried fruits. Canned fruit in light or heavy syrup. Fruit juice. Meat and Other Protein Products Fatty cuts of meat. Ribs, chicken wings, bacon, sausage, bologna, salami, chitterlings, fatback, hot dogs, bratwurst, and packaged luncheon meats. Salted nuts and seeds. Canned beans with salt. Dairy Whole or 2% milk, cream, half-and-half, and cream cheese. Whole-fat or sweetened yogurt. Full-fat cheeses or blue cheese. Nondairy creamers and whipped toppings. Processed cheese, cheese spreads, or cheese curds. Condiments Onion and garlic salt, seasoned salt, table salt, and sea salt. Canned and packaged gravies. Worcestershire sauce. Tartar sauce. Barbecue sauce. Teriyaki  sauce. Soy sauce, including reduced sodium. Steak sauce. Fish sauce. Oyster sauce. Cocktail sauce. Horseradish. Ketchup and mustard. Meat flavorings and tenderizers. Bouillon cubes. Hot sauce. Tabasco sauce. Marinades. Taco seasonings. Relishes. Fats and Oils Butter, stick margarine, lard, shortening, ghee, and bacon fat. Coconut, palm kernel, or palm oils. Regular salad dressings. Other Pickles and olives. Salted popcorn and pretzels. The items listed above may not be a complete list of foods and beverages to avoid. Contact your dietitian for more information. WHERE CAN I FIND MORE INFORMATION? National Heart, Lung, and Blood Institute: travelstabloid.com Document Released: 10/26/2011 Document Revised: 03/23/2014 Document Reviewed: 09/10/2013 [facility] Patient Information 2015 [employer], [place]. This information is not intended to replace advice given to you by your health care provider. Make sure you discuss any questions you have with your health care provider.
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
## 2014-12-19 LAB — CULTURE, URINE COMPREHENSIVE: Colony Count: >=100000

## 2014-12-21 ENCOUNTER — Other Ambulatory Visit: Payer: Self-pay

## 2014-12-21 ENCOUNTER — Ambulatory Visit: Payer: Self-pay | Attending: Internal Medicine

## 2014-12-21 DIAGNOSIS — E139 Other specified diabetes mellitus without complications: Secondary | ICD-10-CM

## 2014-12-21 MED ORDER — DAPAGLIFLOZIN PROPANEDIOL 10 MG PO TABS: 10.0000 mg | ORAL_TABLET | Freq: Every day | ORAL | Status: DC

## 2014-12-21 MED ORDER — INSULIN DETEMIR 100 UNIT/ML ~~LOC~~ SOLN: 50.0000 [IU] | Freq: Two times a day (BID) | SUBCUTANEOUS | Status: DC

## 2014-12-21 MED ORDER — INSULIN ASPART 100 UNIT/ML ~~LOC~~ SOLN: 0.0000 [IU] | Freq: Every day | SUBCUTANEOUS | Status: DC

## 2014-12-22 ENCOUNTER — Emergency Department (HOSPITAL_COMMUNITY)
Admission: EM | Admit: 2014-12-22 | Discharge: 2014-12-22 | Disposition: A | Discharge status: Home / Self Care | Payer: No Typology Code available for payment source | Attending: Emergency Medicine | Admitting: Emergency Medicine

## 2014-12-22 ENCOUNTER — Encounter (HOSPITAL_COMMUNITY): Payer: Self-pay | Admitting: Adult Health

## 2014-12-22 DIAGNOSIS — Z79899 Other long term (current) drug therapy: Secondary | ICD-10-CM | POA: Insufficient documentation

## 2014-12-22 DIAGNOSIS — R112 Nausea with vomiting, unspecified: Secondary | ICD-10-CM | POA: Insufficient documentation

## 2014-12-22 DIAGNOSIS — E1165 Type 2 diabetes mellitus with hyperglycemia: Secondary | ICD-10-CM | POA: Insufficient documentation

## 2014-12-22 DIAGNOSIS — R739 Hyperglycemia, unspecified: Secondary | ICD-10-CM

## 2014-12-22 DIAGNOSIS — Z872 Personal history of diseases of the skin and subcutaneous tissue: Secondary | ICD-10-CM | POA: Insufficient documentation

## 2014-12-22 DIAGNOSIS — Z794 Long term (current) use of insulin: Secondary | ICD-10-CM | POA: Insufficient documentation

## 2014-12-22 DIAGNOSIS — I10 Essential (primary) hypertension: Secondary | ICD-10-CM | POA: Insufficient documentation

## 2014-12-22 LAB — CBC WITH DIFFERENTIAL/PLATELET
BASOS PCT: 0 % (ref 0–1)
Basophils Absolute: 0 10*3/uL (ref 0.0–0.1)
Eosinophils Absolute: 0 10*3/uL (ref 0.0–0.7)
Eosinophils Relative: 0 % (ref 0–5)
HEMATOCRIT: 42.5 % (ref 36.0–46.0)
HEMOGLOBIN: 14.5 g/dL (ref 12.0–15.0)
LYMPHS ABS: 0.6 10*3/uL — AB (ref 0.7–4.0)
Lymphocytes Relative: 5 % — ABNORMAL LOW (ref 12–46)
MCH: 28.8 pg (ref 26.0–34.0)
MCHC: 34.1 g/dL (ref 30.0–36.0)
MCV: 84.5 fL (ref 78.0–100.0)
MONO ABS: 0.9 10*3/uL (ref 0.1–1.0)
Monocytes Relative: 6 % (ref 3–12)
NEUTROS PCT: 89 % — AB (ref 43–77)
Neutro Abs: 12.1 10*3/uL — ABNORMAL HIGH (ref 1.7–7.7)
PLATELETS: 180 10*3/uL (ref 150–400)
RBC: 5.03 MIL/uL (ref 3.87–5.11)
RDW: 13 % (ref 11.5–15.5)
WBC: 13.6 10*3/uL — AB (ref 4.0–10.5)

## 2014-12-22 LAB — CBG MONITORING, ED: GLUCOSE-CAPILLARY: 556 mg/dL — AB (ref 70–99)

## 2014-12-22 LAB — URINALYSIS, ROUTINE W REFLEX MICROSCOPIC
Bilirubin Urine: NEGATIVE
Glucose, UA: >1000 mg/dL — AB
Ketones, ur: 15 mg/dL — AB
LEUKOCYTES UA: NEGATIVE
NITRITE: NEGATIVE
Protein, ur: 30 mg/dL — AB
Specific Gravity, Urine: 1.027 (ref 1.005–1.030)
UROBILINOGEN UA: 2 mg/dL — AB (ref 0.0–1.0)
pH: 5.5 (ref 5.0–8.0)

## 2014-12-22 LAB — URINE MICROSCOPIC-ADD ON

## 2014-12-22 LAB — COMPREHENSIVE METABOLIC PANEL
ALT: 11 U/L (ref 0–35)
AST: 13 U/L (ref 0–37)
Albumin: 3 g/dL — ABNORMAL LOW (ref 3.5–5.2)
Alkaline Phosphatase: 122 U/L — ABNORMAL HIGH (ref 39–117)
Anion gap: 14 (ref 5–15)
BUN: 13 mg/dL (ref 6–23)
CHLORIDE: 92 mmol/L — AB (ref 96–112)
CO2: 24 mmol/L (ref 19–32)
Calcium: 8.4 mg/dL (ref 8.4–10.5)
Creatinine, Ser: 1.2 mg/dL — ABNORMAL HIGH (ref 0.50–1.10)
GFR, EST AFRICAN AMERICAN: 57 mL/min — AB (ref >90)
GFR, EST NON AFRICAN AMERICAN: 49 mL/min — AB (ref >90)
Glucose, Bld: 571 mg/dL (ref 70–99)
Potassium: 3.8 mmol/L (ref 3.5–5.1)
SODIUM: 130 mmol/L — AB (ref 135–145)
TOTAL PROTEIN: 7.6 g/dL (ref 6.0–8.3)
Total Bilirubin: 1.6 mg/dL — ABNORMAL HIGH (ref 0.3–1.2)

## 2014-12-22 LAB — BASIC METABOLIC PANEL
Anion gap: 9 (ref 5–15)
BUN: 12 mg/dL (ref 6–23)
CHLORIDE: 100 mmol/L (ref 96–112)
CO2: 27 mmol/L (ref 19–32)
CREATININE: 0.97 mg/dL (ref 0.50–1.10)
Calcium: 8.2 mg/dL — ABNORMAL LOW (ref 8.4–10.5)
GFR calc Af Amer: 73 mL/min — ABNORMAL LOW (ref >90)
GFR, EST NON AFRICAN AMERICAN: 63 mL/min — AB (ref >90)
GLUCOSE: 402 mg/dL — AB (ref 70–99)
POTASSIUM: 3.8 mmol/L (ref 3.5–5.1)
Sodium: 136 mmol/L (ref 135–145)

## 2014-12-22 LAB — I-STAT VENOUS BLOOD GAS, ED
Bicarbonate: 24.6 mEq/L — ABNORMAL HIGH (ref 20.0–24.0)
O2 Saturation: 82 %
PH VEN: 7.388 — AB (ref 7.250–7.300)
TCO2: 26 mmol/L (ref 0–100)
pCO2, Ven: 40.8 mmHg — ABNORMAL LOW (ref 45.0–50.0)
pO2, Ven: 47 mmHg — ABNORMAL HIGH (ref 30.0–45.0)

## 2014-12-22 MED ORDER — SODIUM CHLORIDE 0.9 % IV BOLUS (SEPSIS)
1000.0000 mL | Freq: Once | INTRAVENOUS | Status: AC
  Administered 2014-12-22: 1000 mL via INTRAVENOUS

## 2014-12-22 MED ORDER — INSULIN ASPART 100 UNIT/ML ~~LOC~~ SOLN
10.0000 [IU] | Freq: Once | SUBCUTANEOUS | Status: AC
  Administered 2014-12-22: 10 [IU] via INTRAVENOUS
  Filled 2014-12-22: qty 1

## 2014-12-22 MED ORDER — INSULIN ASPART 100 UNIT/ML ~~LOC~~ SOLN: 7.0000 [IU] | Freq: Once | SUBCUTANEOUS | Status: DC

## 2014-12-22 MED ORDER — PROMETHAZINE HCL 25 MG PO TABS: 25.0000 mg | ORAL_TABLET | Freq: Four times a day (QID) | ORAL | Status: DC | PRN

## 2014-12-22 NOTE — ED Notes (Signed)
MD at bedside. 

## 2014-12-22 NOTE — Discharge Instructions (Signed)
Correction Insulin °Your health care provider has decided you need to take insulin regularly. You have been given a correction scale (also called a sliding scale) in case you need extra insulin when your blood sugar is too high (hyperglycemia). The following instructions will assist you in how to use that correction scale.  °WHAT IS A CORRECTION SCALE?      °When you check your blood sugar, sometimes it will be higher than your health care provider has told you it should be. You may need an extra dose of insulin to bring your blood sugar to the recommended level (also known as your goal, target, or normal level). The correction scale is prescribed by your health care provider based on your specific needs.  °Your correction scale has two parts:  °· The first shows you a blood sugar range.   °· The second part tells you how much extra insulin to give yourself if your blood sugar falls within this range. °You will not need an extra dose of insulin if your blood glucose is in the desired range. You should simply give yourself the normal amount of insulin that your health care provider has ordered for you.  °WHY IS IT IMPORTANT TO KEEP YOUR BLOOD SUGAR LEVELS AT YOUR DESIRED LEVEL?     °Keeping your blood sugar at the desired level helps to prevent long-term complications of diabetes, such as eye disease, kidney failure, nerve damage, and other serious complications. °WHAT TYPE OF INSULIN WILL YOU USE?   °To help bring down blood sugar levels that are too high, your health care provider will prescribe a short-acting or a rapid-acting insulin. An example of a short-acting insulin would be regular insulin. Remember, you may also have a longer-acting insulin prescribed for you.  °WHAT DO YOU NEED TO DO?   °· Check your blood sugar with your home blood glucose meter as recommended by your health care provider.   °· Using your correction scale, find the range that your blood sugar lies in.   °· Look for the units of insulin  that match that blood sugar range. Give yourself the dose of correction insulin your health care provider has prescribed. Always make sure you are using the right type of insulin.   °· Prior to the injection, make sure you have food available that you can eat in the next 15-30 minutes.   °¨ If your correction insulin is rapid acting, start eating your meal within 15 minutes after you have given yourself the insulin injection. If you wait longer than 15 minutes to eat, your blood sugar might get too low.   °¨ If your correction insulin is short acting (regular), start eating your meal within 30 minutes after you have given yourself the insulin injection. If you wait longer than 30 minutes to eat, your blood sugar might get too low. Symptoms of low blood sugar (hypoglycemia) may include feeling shaky or weak, sweating, feeling confused, difficulty seeing, agitation, crankiness, or numbness of the lips or tongue. Check your blood sugar immediately and treat your results as directed by your health care provider.   °· Keep a log of your blood sugar results with the time you took the test and the amount of insulin that you injected. This information will help your health care provider manage your medicines.   °· Note on your log anything that may affect your blood sugar level, such as:   °¨ Changes in normal exercise or activity.   °¨ Changes in your normal schedule, such as staying up late, going on vacation, changing your diet, or holidays.   °¨ New   medicines. This includes prescription and over-the-counter medicines. Some medicines may cause high blood sugar.   °¨ Sickness, stress, or anxiety.   °¨ Changes in the time you took your medicine.   °¨ Changes in your meals, such as skipping a meal, having a late meal, or dining out.   °¨ Eating things that may affect blood glucose, such as snacks, meal portions that are larger than normal, drinks with sugar, or eating less than usual.   °· Ask your health care provider any  questions you have. °· Be aware of "stacking" your insulin doses. This happens when you correct a high blood sugar level by giving yourself extra insulin too soon after a previous correction dose or mealtime dose. You may then have too much insulin still active in your body and may be at risk for hypoglycemia. °WHY DO YOU NEED A CORRECTION SCALE IF YOU HAVE NEVER BEEN DIAGNOSED WITH DIABETES?   °· Keeping your blood glucose in the target range is important for your overall health.   °· You may have been prescribed medicines that cause your blood glucose to be higher than normal. °WHEN SHOULD YOU SEEK MEDICAL CARE? °Contact your health care provider if:  °· You have experienced hypoglycemia that you are unable to treat with your usual routine.   °· You have a high blood sugar level that is not coming down with the correction dose. °· Your blood sugar is often too low or does not come up even if you eat a fast-acting carbohydrate.  °Someone who lives with you should seek immediate medical care if you become unresponsive. °Document Released: 03/30/2011 Document Revised: 07/09/2013 Document Reviewed: 04/18/2013 °[employer]® Patient Information ©2015 [employer]. This information is not intended to replace advice given to you by your health care provider. Make sure you discuss any questions you have with your health care provider. ° °Hyperglycemia °Hyperglycemia occurs when the glucose (sugar) in your blood is too high. Hyperglycemia can happen for many reasons, but it most often happens to people who do not know they have diabetes or are not managing their diabetes properly.  °CAUSES  °Whether you have diabetes or not, there are other causes of hyperglycemia. Hyperglycemia can occur when you have diabetes, but it can also occur in other situations that you might not be as aware of, such as: °Diabetes °· If you have diabetes and are having problems controlling your blood glucose, hyperglycemia could occur because of some  of the following reasons: °¨ Not following your meal plan. °¨ Not taking your diabetes medications or not taking it properly. °¨ Exercising less or doing less activity than you normally do. °¨ Being sick. °Pre-diabetes °· This cannot be ignored. Before people develop Type 2 diabetes, they almost always have "pre-diabetes." This is when your blood glucose levels are higher than normal, but not yet high enough to be diagnosed as diabetes. Research has shown that some long-term damage to the body, especially the heart and circulatory system, may already be occurring during pre-diabetes. If you take action to manage your blood glucose when you have pre-diabetes, you may delay or prevent Type 2 diabetes from developing. °Stress °· If you have diabetes, you may be "diet" controlled or on oral medications or insulin to control your diabetes. However, you may find that your blood glucose is higher than usual in the hospital whether you have diabetes or not. This is often referred to as "stress hyperglycemia." Stress can elevate your blood glucose. This happens because of hormones put out by the body during times   of stress. If stress has been the cause of your high blood glucose, it can be followed regularly by your caregiver. That way he/she can make sure your hyperglycemia does not continue to get worse or progress to diabetes. °Steroids °· Steroids are medications that act on the infection fighting system (immune system) to block inflammation or infection. One side effect can be a rise in blood glucose. Most people can produce enough extra insulin to allow for this rise, but for those who cannot, steroids make blood glucose levels go even higher. It is not unusual for steroid treatments to "uncover" diabetes that is developing. It is not always possible to determine if the hyperglycemia will go away after the steroids are stopped. A special blood test called an A1c is sometimes done to determine if your blood glucose was  elevated before the steroids were started. °SYMPTOMS °· Thirsty. °· Frequent urination. °· Dry mouth. °· Blurred vision. °· Tired or fatigue. °· Weakness. °· Sleepy. °· Tingling in feet or leg. °DIAGNOSIS  °Diagnosis is made by monitoring blood glucose in one or all of the following ways: °· A1c test. This is a chemical found in your blood. °· Fingerstick blood glucose monitoring. °· Laboratory results. °TREATMENT  °First, knowing the cause of the hyperglycemia is important before the hyperglycemia can be treated. Treatment may include, but is not be limited to: °· Education. °· Change or adjustment in medications. °· Change or adjustment in meal plan. °· Treatment for an illness, infection, etc. °· More frequent blood glucose monitoring. °· Change in exercise plan. °· Decreasing or stopping steroids. °· Lifestyle changes. °HOME CARE INSTRUCTIONS  °· Test your blood glucose as directed. °· Exercise regularly. Your caregiver will give you instructions about exercise. Pre-diabetes or diabetes which comes on with stress is helped by exercising. °· Eat wholesome, balanced meals. Eat often and at regular, fixed times. Your caregiver or nutritionist will give you a meal plan to guide your sugar intake. °· Being at an ideal weight is important. If needed, losing as little as 10 to 15 pounds may help improve blood glucose levels. °SEEK MEDICAL CARE IF:  °· You have questions about medicine, activity, or diet. °· You continue to have symptoms (problems such as increased thirst, urination, or weight gain). °SEEK IMMEDIATE MEDICAL CARE IF:  °· You are vomiting or have diarrhea. °· Your breath smells fruity. °· You are breathing faster or slower. °· You are very sleepy or incoherent. °· You have numbness, tingling, or pain in your feet or hands. °· You have chest pain. °· Your symptoms get worse even though you have been following your caregiver's orders. °· If you have any other questions or concerns. °Document Released:  05/02/2001 Document Revised: 01/29/2012 Document Reviewed: 03/04/2012 °[employer]® Patient Information ©2015 [employer]. This information is not intended to replace advice given to you by your health care provider. Make sure you discuss any questions you have with your health care provider. ° °

## 2014-12-22 NOTE — ED Notes (Signed)
Patient void on bedpan, patient family placed pan on sink and water had got in bedpan so we not able to use for test, so we have to recollect when patient have to void again.

## 2014-12-22 NOTE — ED Notes (Signed)
Lab at the bedside for venous blood gas

## 2014-12-22 NOTE — ED Notes (Addendum)
Presents with nausea and vomiting since Sunday with no relief from nausea medication. Reports emesis x3 per day. Unable to hold fluids down. Endorses generalized abdominal soreness and lower back pain. She has not taken her insulin or BP medication in three days du to not feeling well. Denies diarrhea-c/o urinary frequency and urgency.  cbg greater than 500-she is alert, drowsy and endorses chills.

## 2014-12-22 NOTE — ED Notes (Signed)
Pt stated that she has been urinating frequently "every hour on the hour" pt stated that at times she is unable to make it to the restroom and has wet herself. Pt stated that "I knew my sugar was up because I was peeing so much".

## 2014-12-22 NOTE — ED Notes (Signed)
Patient CBG was 410, Nurse was informed.

## 2014-12-22 NOTE — ED Notes (Signed)
CBG 298 

## 2014-12-22 NOTE — ED Notes (Signed)
Patient CBG was 381, Nurse was informed.

## 2014-12-22 NOTE — ED Provider Notes (Signed)
CSN: [id number]     Arrival date & time 12/22/14  1513 History   First MD Initiated Contact with Patient 12/22/14 1603     Chief Complaint  Patient presents with  . Emesis  . Hyperglycemia     (Consider location/radiation/quality/duration/timing/severity/associated sxs/prior Treatment) Patient is a 59 y.o. female presenting with hyperglycemia. The history is provided by the patient. No language interpreter was used.  Hyperglycemia Blood sugar level PTA:  >300 Severity:  Moderate Onset quality:  Unable to specify Duration:  4 days Timing:  Constant Progression:  Worsening Chronicity:  Recurrent Diabetes status:  Controlled with insulin and controlled with oral medications Current diabetic therapy:  Novolog, levemir, metformin Time since last antidiabetic medication:  3 days Context: noncompliance   Relieved by:  Nothing Ineffective treatments:  None tried Associated symptoms: abdominal pain, fatigue, nausea and vomiting   Associated symptoms: no chest pain, no confusion, no diaphoresis, no dysuria, no fever, no increased thirst, no polyuria and no shortness of breath   Associated symptoms comment:  Polyuria, polydipsia Risk factors: no hx of DKA     Past Medical History  Diagnosis Date  . Diabetes mellitus   . Hypertension   . Boil of buttock    Past Surgical History  Procedure Laterality Date  . Appendectomy     Family History  Problem Relation Age of Onset  . Diabetes Father   . Hypertension Father   . Cancer Maternal Grandmother   . Hypertension Maternal Grandfather   . Kidney disease Mother   . Hypertension Mother    History  Substance Use Topics  . Smoking status: Never Smoker   . Smokeless tobacco: Not on file  . Alcohol Use: No   OB History    No data available     Review of Systems  Constitutional: Positive for activity change, appetite change and fatigue. Negative for fever, chills and diaphoresis.  HENT: Negative for congestion, facial swelling,  rhinorrhea and sore throat.   Eyes: Negative for photophobia and discharge.  Respiratory: Negative for cough, chest tightness and shortness of breath.   Cardiovascular: Negative for chest pain, palpitations and leg swelling.  Gastrointestinal: Positive for nausea, vomiting and abdominal pain. Negative for diarrhea.  Endocrine: Negative for polydipsia and polyuria.  Genitourinary: Negative for dysuria, frequency, difficulty urinating and pelvic pain.  Musculoskeletal: Negative for back pain, arthralgias, neck pain and neck stiffness.  Skin: Negative for color change and wound.  Allergic/Immunologic: Negative for immunocompromised state.  Neurological: Positive for weakness. Negative for facial asymmetry, numbness and headaches.  Hematological: Does not bruise/bleed easily.  Psychiatric/Behavioral: Negative for confusion and agitation.      Allergies  Other  Home Medications   Prior to Admission medications   Medication Sig Start Date End Date Taking? Authorizing Provider  amLODipine (NORVASC) 10 MG tablet Take 1 tablet (10 mg total) by mouth daily. 12/01/14  Yes Lorayne Marek, MD  chlorhexidine (PERIDEX) 0.12 % solution Use as directed 15 mLs in the mouth or throat 2 (two) times daily. 12/01/14  Yes Lorayne Marek, MD  dapagliflozin propanediol (FARXIGA) 10 MG TABS tablet Take 10 mg by mouth daily. 12/21/14  Yes Lorayne Marek, MD  gabapentin (NEURONTIN) 300 MG capsule Take 300 mg by mouth at bedtime.   Yes Historical Provider, MD  insulin aspart (NOVOLOG) 100 UNIT/ML injection Inject 0-5 Units into the skin at bedtime. CBG 70 - 120: 0 units CBG 121 - 150: 0 units CBG 151 - 200: 0 units CBG 201 - 250:  2 units CBG 251 - 300: 3 units CBG 301 - 350: 4 units CBG 351 - 400: 5 units 12/21/14  Yes Deepak Advani, MD  insulin detemir (LEVEMIR) 100 UNIT/ML injection Inject 0.5 mLs (50 Units total) into the skin 2 (two) times daily. 12/21/14  Yes Lorayne Marek, MD  losartan (COZAAR) 25 MG tablet Take 1  tablet (25 mg total) by mouth daily. 12/17/14  Yes Lorayne Marek, MD  Multiple Vitamin (MULTIVITAMIN WITH MINERALS) TABS tablet Take 1 tablet by mouth daily.   Yes Historical Provider, MD  diazepam (VALIUM) 5 MG tablet Take 1 tablet (5 mg total) by mouth every 6 (six) hours as needed (spasms). Patient not taking: Reported on 12/17/2014 12/11/14   Debby Freiberg, MD  metFORMIN (GLUCOPHAGE) 500 MG tablet Take 2 tablets (1,000 mg total) by mouth 2 (two) times daily. Patient states she takes 1000mg  twice a day per patient Patient not taking: Reported on 12/17/2014 12/01/14   Lorayne Marek, MD  oxyCODONE-acetaminophen (PERCOCET/ROXICET) 5-325 MG per tablet Take 1-2 tablets by mouth every 4 (four) hours as needed for severe pain. Patient not taking: Reported on 12/17/2014 12/11/14   Debby Freiberg, MD  promethazine (PHENERGAN) 25 MG tablet Take 1 tablet (25 mg total) by mouth every 6 (six) hours as needed for nausea or vomiting. 12/22/14   Ernestina Patches, MD   BP 179/55 mmHg  Pulse 99  Temp(Src) 100.5 F (38.1 C) (Oral)  Resp 18  Ht 6' (1.829 m)  Wt 214 lb (97.07 kg)  BMI 29.02 kg/m2  SpO2 96% Physical Exam  Constitutional: She is oriented to person, place, and time. She appears well-developed and well-nourished. No distress.  HENT:  Head: Normocephalic and atraumatic.  Mouth/Throat: No oropharyngeal exudate.  Eyes: Pupils are equal, round, and reactive to light.  Neck: Normal range of motion. Neck supple.  Cardiovascular: Normal rate, regular rhythm and normal heart sounds.  Exam reveals no gallop and no friction rub.   No murmur heard. Pulmonary/Chest: Effort normal and breath sounds normal. No respiratory distress. She has no wheezes. She has no rales.  Abdominal: Soft. Bowel sounds are normal. She exhibits no distension and no mass. There is no tenderness. There is no rebound and no guarding.  Musculoskeletal: Normal range of motion. She exhibits no edema or tenderness.  Neurological: She is  alert and oriented to person, place, and time.  Skin: Skin is warm and dry.  Psychiatric: She has a normal mood and affect.    ED Course  Procedures (including critical care time) Labs Review Labs Reviewed  COMPREHENSIVE METABOLIC PANEL - Abnormal; Notable for the following:    Sodium 130 (*)    Chloride 92 (*)    Glucose, Bld 571 (*)    Creatinine, Ser 1.20 (*)    Albumin 3.0 (*)    Alkaline Phosphatase 122 (*)    Total Bilirubin 1.6 (*)    GFR calc non Af Amer 49 (*)    GFR calc Af Amer 57 (*)    All other components within normal limits  CBC WITH DIFFERENTIAL/PLATELET - Abnormal; Notable for the following:    WBC 13.6 (*)    Neutrophils Relative % 89 (*)    Neutro Abs 12.1 (*)    Lymphocytes Relative 5 (*)    Lymphs Abs 0.6 (*)    All other components within normal limits  URINALYSIS, ROUTINE W REFLEX MICROSCOPIC - Abnormal; Notable for the following:    APPearance CLOUDY (*)    Glucose, UA >1000 (*)  Hgb urine dipstick LARGE (*)    Ketones, ur 15 (*)    Protein, ur 30 (*)    Urobilinogen, UA 2.0 (*)    All other components within normal limits  BASIC METABOLIC PANEL - Abnormal; Notable for the following:    Glucose, Bld 402 (*)    Calcium 8.2 (*)    GFR calc non Af Amer 63 (*)    GFR calc Af Amer 73 (*)    All other components within normal limits  URINE MICROSCOPIC-ADD ON - Abnormal; Notable for the following:    Bacteria, UA FEW (*)    All other components within normal limits  CBG MONITORING, ED - Abnormal; Notable for the following:    Glucose-Capillary 556 (*)    All other components within normal limits  I-STAT VENOUS BLOOD GAS, ED - Abnormal; Notable for the following:    pH, Ven 7.388 (*)    pCO2, Ven 40.8 (*)    pO2, Ven 47.0 (*)    Bicarbonate 24.6 (*)    All other components within normal limits  URINE CULTURE  CBG MONITORING, ED    Imaging Review No results found.   EKG Interpretation None      MDM   Final diagnoses:  Hyperglycemia   Non-intractable vomiting with nausea, vomiting of unspecified type    Pt is a 59 y.o. female with Pmhx as above who presents with 3-4 days of malaise, fatigue, nausea, several episodes of vomiting today and hyperglycemia.  Patient has not been able to eat or drink much the past several days and has not taken her insulin or her blood pressure medication 3 days.  She was not feeling well.  She denies fevers, chills, coughing, diarrhea.  She's had polyuria and polydipsia.  AG normalized after 2L NS< 10U IV insulin ordered, with continued improvement of blood sugar to under 300.  She is able to tolerate by mouth without vomiting.  She did develop a temperature 100.5, which I cannot account for.  She has urinated here without discomfort.  Urine cultures been sent.  She has no respiratory symptoms.  I suspect that her abdominal pain, nausea, vomiting from her elevated blood sugar.  She does not currently have findings consistent with DKA and can be continued to be managed as an outpatient   Mackinsey G Pucillo evaluation in the Emergency Department is complete. It has been determined that no acute conditions requiring further emergency intervention are present at this time. The patient/guardian have been advised of the diagnosis and plan. We have discussed signs and symptoms that warrant return to the ED, such as changes or worsening in symptoms, worsening pain, worsening blood sugar, inability to tolerate liquids      Ernestina Patches, MD 12/23/14 [phone]

## 2014-12-23 LAB — CBG MONITORING, ED
Glucose-Capillary: 410 mg/dL — ABNORMAL HIGH (ref 70–99)
Glucose-Capillary: 381 mg/dL — ABNORMAL HIGH (ref 70–99)
Glucose-Capillary: 298 mg/dL — ABNORMAL HIGH (ref 70–99)

## 2014-12-23 LAB — URINE CULTURE
Colony Count: >=100000
Special Requests: NORMAL

## 2014-12-26 ENCOUNTER — Inpatient Hospital Stay (HOSPITAL_COMMUNITY)
Admission: EM | Admit: 2014-12-26 | Discharge: 2014-12-29 | DRG: 638 | Disposition: A | Discharge status: Home / Self Care | Payer: No Typology Code available for payment source | Attending: Internal Medicine | Admitting: Internal Medicine

## 2014-12-26 ENCOUNTER — Encounter (HOSPITAL_COMMUNITY): Payer: Self-pay | Admitting: Emergency Medicine

## 2014-12-26 DIAGNOSIS — E860 Dehydration: Secondary | ICD-10-CM | POA: Diagnosis present

## 2014-12-26 DIAGNOSIS — E1310 Other specified diabetes mellitus with ketoacidosis without coma: Principal | ICD-10-CM | POA: Diagnosis present

## 2014-12-26 DIAGNOSIS — E1110 Type 2 diabetes mellitus with ketoacidosis without coma: Secondary | ICD-10-CM | POA: Diagnosis present

## 2014-12-26 DIAGNOSIS — Z833 Family history of diabetes mellitus: Secondary | ICD-10-CM

## 2014-12-26 DIAGNOSIS — E118 Type 2 diabetes mellitus with unspecified complications: Secondary | ICD-10-CM

## 2014-12-26 DIAGNOSIS — I10 Essential (primary) hypertension: Secondary | ICD-10-CM

## 2014-12-26 DIAGNOSIS — N390 Urinary tract infection, site not specified: Secondary | ICD-10-CM

## 2014-12-26 DIAGNOSIS — E870 Hyperosmolality and hypernatremia: Secondary | ICD-10-CM | POA: Diagnosis present

## 2014-12-26 DIAGNOSIS — N179 Acute kidney failure, unspecified: Secondary | ICD-10-CM | POA: Diagnosis present

## 2014-12-26 DIAGNOSIS — E0810 Diabetes mellitus due to underlying condition with ketoacidosis without coma: Secondary | ICD-10-CM

## 2014-12-26 DIAGNOSIS — E7800 Pure hypercholesterolemia, unspecified: Secondary | ICD-10-CM | POA: Diagnosis present

## 2014-12-26 DIAGNOSIS — Z794 Long term (current) use of insulin: Secondary | ICD-10-CM

## 2014-12-26 DIAGNOSIS — E1165 Type 2 diabetes mellitus with hyperglycemia: Secondary | ICD-10-CM | POA: Diagnosis present

## 2014-12-26 DIAGNOSIS — K3184 Gastroparesis: Secondary | ICD-10-CM | POA: Diagnosis present

## 2014-12-26 LAB — CBC
HCT: 48.1 % — ABNORMAL HIGH (ref 36.0–46.0)
Hemoglobin: 15.6 g/dL — ABNORMAL HIGH (ref 12.0–15.0)
MCH: 28.3 pg (ref 26.0–34.0)
MCHC: 32.4 g/dL (ref 30.0–36.0)
MCV: 87.3 fL (ref 78.0–100.0)
Platelets: 230 10*3/uL (ref 150–400)
RBC: 5.51 MIL/uL — ABNORMAL HIGH (ref 3.87–5.11)
RDW: 13.8 % (ref 11.5–15.5)
WBC: 10.9 10*3/uL — ABNORMAL HIGH (ref 4.0–10.5)

## 2014-12-26 LAB — URINE MICROSCOPIC-ADD ON

## 2014-12-26 LAB — COMPREHENSIVE METABOLIC PANEL
ALT: 11 U/L (ref 0–35)
AST: 11 U/L (ref 0–37)
Albumin: 2.7 g/dL — ABNORMAL LOW (ref 3.5–5.2)
Alkaline Phosphatase: 120 U/L — ABNORMAL HIGH (ref 39–117)
Anion gap: 16 — ABNORMAL HIGH (ref 5–15)
BUN: 36 mg/dL — ABNORMAL HIGH (ref 6–23)
CO2: 27 mmol/L (ref 19–32)
CREATININE: 1.35 mg/dL — AB (ref 0.50–1.10)
Calcium: 9.7 mg/dL (ref 8.4–10.5)
Chloride: 108 mmol/L (ref 96–112)
GFR calc Af Amer: 49 mL/min — ABNORMAL LOW (ref >90)
GFR calc non Af Amer: 42 mL/min — ABNORMAL LOW (ref >90)
GLUCOSE: 648 mg/dL — AB (ref 70–99)
Potassium: 4.3 mmol/L (ref 3.5–5.1)
Sodium: 151 mmol/L — ABNORMAL HIGH (ref 135–145)
Total Bilirubin: 1.1 mg/dL (ref 0.3–1.2)
Total Protein: 8 g/dL (ref 6.0–8.3)

## 2014-12-26 LAB — BASIC METABOLIC PANEL
Anion gap: 12 (ref 5–15)
Anion gap: 13 (ref 5–15)
Anion gap: 9 (ref 5–15)
BUN: 32 mg/dL — AB (ref 6–23)
BUN: 28 mg/dL — AB (ref 6–23)
BUN: 29 mg/dL — ABNORMAL HIGH (ref 6–23)
CALCIUM: 9 mg/dL (ref 8.4–10.5)
CHLORIDE: 116 mmol/L — AB (ref 96–112)
CO2: 28 mmol/L (ref 19–32)
CO2: 26 mmol/L (ref 19–32)
CO2: 26 mmol/L (ref 19–32)
CREATININE: 1.14 mg/dL — AB (ref 0.50–1.10)
CREATININE: 0.85 mg/dL (ref 0.50–1.10)
Calcium: 8.9 mg/dL (ref 8.4–10.5)
Calcium: 9 mg/dL (ref 8.4–10.5)
Chloride: 114 mmol/L — ABNORMAL HIGH (ref 96–112)
Chloride: 119 mmol/L — ABNORMAL HIGH (ref 96–112)
Creatinine, Ser: 1.11 mg/dL — ABNORMAL HIGH (ref 0.50–1.10)
GFR calc Af Amer: 60 mL/min — ABNORMAL LOW (ref >90)
GFR calc Af Amer: 86 mL/min — ABNORMAL LOW (ref >90)
GFR calc non Af Amer: 54 mL/min — ABNORMAL LOW (ref >90)
GFR calc non Af Amer: 52 mL/min — ABNORMAL LOW (ref >90)
GFR, EST AFRICAN AMERICAN: 62 mL/min — AB (ref >90)
GFR, EST NON AFRICAN AMERICAN: 74 mL/min — AB (ref >90)
GLUCOSE: 425 mg/dL — AB (ref 70–99)
Glucose, Bld: 440 mg/dL — ABNORMAL HIGH (ref 70–99)
Glucose, Bld: 274 mg/dL — ABNORMAL HIGH (ref 70–99)
Potassium: 4.3 mmol/L (ref 3.5–5.1)
Potassium: 3.9 mmol/L (ref 3.5–5.1)
Potassium: 4.2 mmol/L (ref 3.5–5.1)
SODIUM: 154 mmol/L — AB (ref 135–145)
Sodium: 155 mmol/L — ABNORMAL HIGH (ref 135–145)
Sodium: 154 mmol/L — ABNORMAL HIGH (ref 135–145)

## 2014-12-26 LAB — CBG MONITORING, ED
GLUCOSE-CAPILLARY: 507 mg/dL — AB (ref 70–99)
GLUCOSE-CAPILLARY: 268 mg/dL — AB (ref 70–99)
Glucose-Capillary: 186 mg/dL — ABNORMAL HIGH (ref 70–99)
Glucose-Capillary: 230 mg/dL — ABNORMAL HIGH (ref 70–99)
Glucose-Capillary: 323 mg/dL — ABNORMAL HIGH (ref 70–99)
Glucose-Capillary: 349 mg/dL — ABNORMAL HIGH (ref 70–99)
Glucose-Capillary: 364 mg/dL — ABNORMAL HIGH (ref 70–99)
Glucose-Capillary: 405 mg/dL — ABNORMAL HIGH (ref 70–99)

## 2014-12-26 LAB — URINALYSIS, ROUTINE W REFLEX MICROSCOPIC
Bilirubin Urine: NEGATIVE
Ketones, ur: 40 mg/dL — AB
Nitrite: POSITIVE — AB
Protein, ur: 30 mg/dL — AB
Specific Gravity, Urine: 1.029 (ref 1.005–1.030)
Urobilinogen, UA: 1 mg/dL (ref 0.0–1.0)
pH: 5 (ref 5.0–8.0)

## 2014-12-26 LAB — LIPASE, BLOOD: Lipase: 18 U/L (ref 11–59)

## 2014-12-26 MED ORDER — OXYCODONE-ACETAMINOPHEN 5-325 MG PO TABS: 1.0000 | ORAL_TABLET | ORAL | Status: DC | PRN

## 2014-12-26 MED ORDER — SODIUM CHLORIDE 0.9 % IV BOLUS (SEPSIS)
2000.0000 mL | Freq: Once | INTRAVENOUS | Status: AC
  Administered 2014-12-26: 2000 mL via INTRAVENOUS

## 2014-12-26 MED ORDER — AMLODIPINE BESYLATE 10 MG PO TABS
10.0000 mg | ORAL_TABLET | Freq: Every day | ORAL | Status: DC
  Administered 2014-12-26 – 2014-12-29 (×4): 10 mg via ORAL
  Filled 2014-12-26: qty 1
  Filled 2014-12-26 (×2): qty 2
  Filled 2014-12-26: qty 1

## 2014-12-26 MED ORDER — ONDANSETRON HCL 4 MG/2ML IJ SOLN
4.0000 mg | Freq: Once | INTRAMUSCULAR | Status: AC
  Administered 2014-12-26: 4 mg via INTRAVENOUS
  Filled 2014-12-26: qty 2

## 2014-12-26 MED ORDER — SODIUM CHLORIDE 0.9 % IV BOLUS (SEPSIS)
1000.0000 mL | Freq: Once | INTRAVENOUS | Status: AC
  Administered 2014-12-26: 1000 mL via INTRAVENOUS

## 2014-12-26 MED ORDER — ONDANSETRON HCL 4 MG/2ML IJ SOLN
4.0000 mg | Freq: Four times a day (QID) | INTRAMUSCULAR | Status: DC | PRN
  Administered 2014-12-29: 4 mg via INTRAVENOUS
  Filled 2014-12-26: qty 2

## 2014-12-26 MED ORDER — SODIUM CHLORIDE 0.9 % IV SOLN
INTRAVENOUS | Status: DC
  Administered 2014-12-26: 3 [IU]/h via INTRAVENOUS
  Filled 2014-12-26: qty 2.5

## 2014-12-26 MED ORDER — ONDANSETRON HCL 4 MG PO TABS: 4.0000 mg | ORAL_TABLET | Freq: Four times a day (QID) | ORAL | Status: DC | PRN

## 2014-12-26 MED ORDER — ADULT MULTIVITAMIN W/MINERALS CH
1.0000 | ORAL_TABLET | Freq: Every day | ORAL | Status: DC
  Administered 2014-12-26 – 2014-12-29 (×4): 1 via ORAL
  Filled 2014-12-26 (×4): qty 1

## 2014-12-26 MED ORDER — INSULIN REGULAR HUMAN 100 UNIT/ML IJ SOLN
INTRAMUSCULAR | Status: DC
  Administered 2014-12-26: 5.8 [IU]/h via INTRAVENOUS
  Filled 2014-12-26: qty 2.5

## 2014-12-26 MED ORDER — ENOXAPARIN SODIUM 40 MG/0.4ML ~~LOC~~ SOLN
40.0000 mg | SUBCUTANEOUS | Status: DC
  Administered 2014-12-26 – 2014-12-28 (×3): 40 mg via SUBCUTANEOUS
  Filled 2014-12-26 (×4): qty 0.4

## 2014-12-26 MED ORDER — HYDRALAZINE HCL 20 MG/ML IJ SOLN
10.0000 mg | Freq: Three times a day (TID) | INTRAMUSCULAR | Status: DC | PRN
  Administered 2014-12-29: 10 mg via INTRAVENOUS
  Filled 2014-12-26: qty 1

## 2014-12-26 MED ORDER — SODIUM CHLORIDE 0.45 % IV SOLN
INTRAVENOUS | Status: DC
  Administered 2014-12-26: 20:00:00 via INTRAVENOUS

## 2014-12-26 MED ORDER — DEXTROSE-NACL 5-0.45 % IV SOLN
INTRAVENOUS | Status: DC
  Administered 2014-12-26: 125 mL/h via INTRAVENOUS
  Administered 2014-12-27: 08:00:00 via INTRAVENOUS

## 2014-12-26 MED ORDER — GABAPENTIN 300 MG PO CAPS
300.0000 mg | ORAL_CAPSULE | Freq: Every day | ORAL | Status: DC
  Administered 2014-12-26 – 2014-12-27 (×2): 300 mg via ORAL
  Filled 2014-12-26 (×4): qty 1

## 2014-12-26 MED ORDER — PROMETHAZINE HCL 25 MG PO TABS
25.0000 mg | ORAL_TABLET | Freq: Four times a day (QID) | ORAL | Status: DC | PRN
  Filled 2014-12-26: qty 1

## 2014-12-26 MED ORDER — DEXTROSE-NACL 5-0.45 % IV SOLN: INTRAVENOUS | Status: DC

## 2014-12-26 MED ORDER — DEXTROSE 5 % IV SOLN
1.0000 g | INTRAVENOUS | Status: DC
  Administered 2014-12-26 – 2014-12-28 (×3): 1 g via INTRAVENOUS
  Filled 2014-12-26 (×4): qty 10

## 2014-12-26 MED ORDER — MORPHINE SULFATE 2 MG/ML IJ SOLN: 1.0000 mg | INTRAMUSCULAR | Status: DC | PRN

## 2014-12-26 MED ORDER — POTASSIUM CHLORIDE 10 MEQ/100ML IV SOLN
10.0000 meq | INTRAVENOUS | Status: AC
  Administered 2014-12-26 (×2): 10 meq via INTRAVENOUS
  Filled 2014-12-26 (×2): qty 100

## 2014-12-26 NOTE — H&P (Signed)
[facility] History and Physical  Katherine [name] WSF:[id number] DOB: [birth year] DOA: 12/26/2014  Referring physician: Esaw Grandchild PCP: Lorayne Marek, MD   Chief Complaint: high blood sugar, weakness.   HPI: Katherine [name] is a 59 y.o. female with PMH significant for uncontrolled Diabetes, Hypertension, who presents with weakness, and high blood sugar. Patient relates that she has been feeling so weak that she was not able to inject herself insulin. Her husband didn't know how to do it. Patient was vomiting the day prior to admission. She was also seen in the ED on 2-2 with high blood sugar. Patient denies dyspnea, chest pain. No abdominal pain.  Evaluation today in the ED: Blood sugar: 648, gap at 16, sodium level at 151. WBC at 10. UA with positive nitrates, WBC 21-50. CR at 1.35. She received 3 NS in the ED. She was started on insulin gtt.    Review of Systems:  Negative except as per HPI  Past Medical History  Diagnosis Date  . Diabetes mellitus   . Hypertension   . Boil of buttock    Past Surgical History  Procedure Laterality Date  . Appendectomy     Social History:  reports that she has never smoked. She does not have any smokeless tobacco history on file. She reports that she does not drink alcohol or use illicit drugs.  Allergies  Allergen Reactions  . Other Swelling    Seaweed-= swelling onArms, hands and face    Family History  Problem Relation Age of Onset  . Diabetes Father   . Hypertension Father   . Cancer Maternal Grandmother   . Hypertension Maternal Grandfather   . Kidney disease Mother   . Hypertension Mother     Prior to Admission medications   Medication Sig Start Date End Date Taking? Authorizing Provider  amLODipine (NORVASC) 10 MG tablet Take 1 tablet (10 mg total) by mouth daily. 12/01/14  Yes Lorayne Marek, MD  dapagliflozin propanediol (FARXIGA) 10 MG TABS tablet Take 10 mg by mouth daily. 12/21/14  Yes Lorayne Marek, MD  gabapentin  (NEURONTIN) 300 MG capsule Take 300 mg by mouth at bedtime.   Yes Historical Provider, MD  insulin aspart (NOVOLOG) 100 UNIT/ML injection Inject 0-5 Units into the skin at bedtime. CBG 70 - 120: 0 units CBG 121 - 150: 0 units CBG 151 - 200: 0 units CBG 201 - 250: 2 units CBG 251 - 300: 3 units CBG 301 - 350: 4 units CBG 351 - 400: 5 units 12/21/14  Yes Deepak Advani, MD  insulin detemir (LEVEMIR) 100 UNIT/ML injection Inject 0.5 mLs (50 Units total) into the skin 2 (two) times daily. 12/21/14  Yes Lorayne Marek, MD  losartan (COZAAR) 25 MG tablet Take 1 tablet (25 mg total) by mouth daily. 12/17/14  Yes Lorayne Marek, MD  Multiple Vitamin (MULTIVITAMIN WITH MINERALS) TABS tablet Take 1 tablet by mouth daily.   Yes Historical Provider, MD  promethazine (PHENERGAN) 25 MG tablet Take 1 tablet (25 mg total) by mouth every 6 (six) hours as needed for nausea or vomiting. 12/22/14  Yes Ernestina Patches, MD  chlorhexidine (PERIDEX) 0.12 % solution Use as directed 15 mLs in the mouth or throat 2 (two) times daily. 12/01/14   Lorayne Marek, MD  diazepam (VALIUM) 5 MG tablet Take 1 tablet (5 mg total) by mouth every 6 (six) hours as needed (spasms). Patient not taking: Reported on 12/17/2014 12/11/14   Debby Freiberg, MD  metFORMIN (GLUCOPHAGE) 500 MG tablet  Take 2 tablets (1,000 mg total) by mouth 2 (two) times daily. Patient states she takes 1064m twice a day per patient Patient not taking: Reported on 12/17/2014 12/01/14   DLorayne Marek MD  oxyCODONE-acetaminophen (PERCOCET/ROXICET) 5-325 MG per tablet Take 1-2 tablets by mouth every 4 (four) hours as needed for severe pain. Patient not taking: Reported on 12/17/2014 12/11/14   MDebby Freiberg MD   Physical Exam: Filed Vitals:   12/26/14 1600 12/26/14 1730 12/26/14 1800 12/26/14 1830  BP: 187/72 174/55 160/83 177/74  Pulse: 83 93 89 88  Temp:      TempSrc:      Resp: _0 Height:      Weight:      SpO2: 94% 93% 93% 93%    Wt Readings from Last 3  Encounters:  12/26/14 97.07 kg (214 lb)  12/22/14 97.07 kg (214 lb)  12/01/14 95.8 kg (211 lb 3.2 oz)    General:  Appears calm and comfortable Eyes: PERRL, normal lids, irises & conjunctiva ENT: grossly normal hearing, lips & tongue Neck: no LAD, masses or thyromegaly Cardiovascular: RRR, no m/r/g. No LE edema. Telemetry: SR, no arrhythmias  Respiratory: CTA bilaterally, no w/r/r. Normal respiratory effort. Abdomen: soft, ntnd Skin: no rash or induration seen on limited exam Musculoskeletal: grossly normal tone BUE/BLE Psychiatric: grossly normal mood and affect, speech fluent and appropriate Neurologic: grossly non-focal.          Labs on Admission:  Basic Metabolic Panel:  Recent Labs Lab 12/22/14 1529 12/22/14 1853 12/26/14 1210 12/26/14 1647  NA 130* 136 151* 154*  K 3.8 3.8 4.3 4.3  CL 92* 100 108 114*  CO2 _1 GLUCOSE 571* 402* 648* 440*  BUN 13 12 36* 32*  CREATININE 1.20* 0.97 1.35* 1.11*  CALCIUM 8.4 8.2* 9.7 8.9   Liver Function Tests:  Recent Labs Lab 12/22/14 1529 12/26/14 1210  AST 13 11  ALT 11 11  ALKPHOS 122* 120*  BILITOT 1.6* 1.1  PROT 7.6 8.0  ALBUMIN 3.0* 2.7*    Recent Labs Lab 12/26/14 1210  LIPASE 18   No results for input(s): AMMONIA in the last 168 hours. CBC:  Recent Labs Lab 12/22/14 1529 12/26/14 1210  WBC 13.6* 10.9*  NEUTROABS 12.1*  --   HGB 14.5 15.6*  HCT 42.5 48.1*  MCV 84.5 87.3  PLT 180 230   Cardiac Enzymes: No results for input(s): CKTOTAL, CKMB, CKMBINDEX, TROPONINI in the last 168 hours.  BNP (last 3 results) No results for input(s): BNP in the last 8760 hours.  ProBNP (last 3 results) No results for input(s): PROBNP in the last 8760 hours.  CBG:  Recent Labs Lab 12/22/14 1828 12/22/14 2026 12/26/14 1207 12/26/14 1438 12/26/14 1818  GLUCAP 381* 298* 507* 405* 364*    Radiological Exams on Admission: No results found.  EKG: none ordered.   Assessment/Plan Principal  Problem:   DKA (diabetic ketoacidoses) Active Problems:   Diabetes mellitus type 2 with complications, uncontrolled   HYPERCHOLESTEROLEMIA   HYPERTENSION, BENIGN ESSENTIAL   Gastroparesis   UTI (lower urinary tract infection)  1-Probably DKA, Uncontrolled Diabetes;  Patient with Blood sugar in the 600 range, gap at 16, although bicarb at 27, positive ketone in urine.  Treat for DKA, IV fluids, insulin Gtt. Will use half NS due to hypernatremia.  Transition to levemir when gap close.   2-Hypernatremia; will use half NS fluids. Encourage water intake. Follow B-met.   3-HTN; continue with  Norvasc. Hydralazine PRN.   4-UTI; UA with positive nitrates, WBC 21-50. Patient in DKA. Will start Ceftriaxone. Urine culture ordered.   5-AKI; in setting dehydration. IV fluids.   consulted, please document name and whether formally or informally consulted  Code Status: full code.  DVT Prophylaxis:Lovenox Family Communication: care discussed with patient Disposition Plan: expect 2 to 3 days inpatient  Time spent: 75 minutes.   Niel Hummer A [facility] Pager [phone]

## 2014-12-26 NOTE — ED Notes (Addendum)
Pt provided sprite zero for PO challenge.

## 2014-12-26 NOTE — ED Notes (Addendum)
Pt reports increased blood sugar unknown how long. Reports that she has not been checking her blood sugar, and the person that lives with her is too scared to check her blood sugar. CBG in triage 507. Pt has not been taking her blood sugar medications x 3 days.

## 2014-12-26 NOTE — ED Notes (Signed)
Incontinent of large amount of urine.  Incontinence care given.  Linen changed.  No distress noted or complaints voiced at this time.  Encouraged to call for assistance as needed.

## 2014-12-26 NOTE — ED Notes (Signed)
Placed on hospital bed for comfort.  Warm blanket provided.  No distress noted or complaints voiced at this time.  Encouraged to call for assistance as needed.  Bed in lowest position with call bell in place.

## 2014-12-26 NOTE — ED Provider Notes (Signed)
CSN: [id number]     Arrival date & time 12/26/14  1200 History   First MD Initiated Contact with Patient 12/26/14 1235     Chief Complaint  Patient presents with  . Hyperglycemia     (Consider location/radiation/quality/duration/timing/severity/associated sxs/prior Treatment) HPI Katherine [name] is a 59 year old female with past medical history of insulin-dependent diabetes, hypertension who presents the ER complaining of hyperglycemia. Patient states her blood sugar is been high, and she has been complaining of nausea, vomiting for approximately one week. Patient was seen and evaluated 4 days ago and discharged home with non-intractable nausea and vomiting. Patient reports since then her nausea has become worse, she has been unable to keep anything down by mouth, and she has been unable to take any of her insulin due to nausea. Patient denies fever, diarrhea, abdominal pain, dysuria.  Past Medical History  Diagnosis Date  . Diabetes mellitus   . Hypertension   . Boil of buttock    Past Surgical History  Procedure Laterality Date  . Appendectomy     Family History  Problem Relation Age of Onset  . Diabetes Father   . Hypertension Father   . Cancer Maternal Grandmother   . Hypertension Maternal Grandfather   . Kidney disease Mother   . Hypertension Mother    History  Substance Use Topics  . Smoking status: Never Smoker   . Smokeless tobacco: Not on file  . Alcohol Use: No   OB History    No data available     Review of Systems  Constitutional: Negative for fever.  HENT: Negative for trouble swallowing.   Eyes: Negative for visual disturbance.  Respiratory: Negative for shortness of breath.   Cardiovascular: Negative for chest pain.  Gastrointestinal: Positive for nausea and vomiting. Negative for abdominal pain.  Endocrine:       Hyperglycemia   Genitourinary: Negative for dysuria.  Musculoskeletal: Negative for neck pain.  Skin: Negative for rash.  Neurological:  Negative for dizziness, weakness and numbness.  Psychiatric/Behavioral: Negative.       Allergies  Other  Home Medications   Prior to Admission medications   Medication Sig Start Date End Date Taking? Authorizing Provider  amLODipine (NORVASC) 10 MG tablet Take 1 tablet (10 mg total) by mouth daily. 12/01/14  Yes Lorayne Marek, MD  dapagliflozin propanediol (FARXIGA) 10 MG TABS tablet Take 10 mg by mouth daily. 12/21/14  Yes Lorayne Marek, MD  gabapentin (NEURONTIN) 300 MG capsule Take 300 mg by mouth at bedtime.   Yes Historical Provider, MD  insulin aspart (NOVOLOG) 100 UNIT/ML injection Inject 0-5 Units into the skin at bedtime. CBG 70 - 120: 0 units CBG 121 - 150: 0 units CBG 151 - 200: 0 units CBG 201 - 250: 2 units CBG 251 - 300: 3 units CBG 301 - 350: 4 units CBG 351 - 400: 5 units 12/21/14  Yes Deepak Advani, MD  insulin detemir (LEVEMIR) 100 UNIT/ML injection Inject 0.5 mLs (50 Units total) into the skin 2 (two) times daily. 12/21/14  Yes Lorayne Marek, MD  losartan (COZAAR) 25 MG tablet Take 1 tablet (25 mg total) by mouth daily. 12/17/14  Yes Lorayne Marek, MD  Multiple Vitamin (MULTIVITAMIN WITH MINERALS) TABS tablet Take 1 tablet by mouth daily.   Yes Historical Provider, MD  promethazine (PHENERGAN) 25 MG tablet Take 1 tablet (25 mg total) by mouth every 6 (six) hours as needed for nausea or vomiting. 12/22/14  Yes Ernestina Patches, MD  chlorhexidine (PERIDEX) 0.12 %  solution Use as directed 15 mLs in the mouth or throat 2 (two) times daily. 12/01/14   Lorayne Marek, MD  diazepam (VALIUM) 5 MG tablet Take 1 tablet (5 mg total) by mouth every 6 (six) hours as needed (spasms). Patient not taking: Reported on 12/17/2014 12/11/14   Debby Freiberg, MD  metFORMIN (GLUCOPHAGE) 500 MG tablet Take 2 tablets (1,000 mg total) by mouth 2 (two) times daily. Patient states she takes 1000mg  twice a day per patient Patient not taking: Reported on 12/17/2014 12/01/14   Lorayne Marek, MD   oxyCODONE-acetaminophen (PERCOCET/ROXICET) 5-325 MG per tablet Take 1-2 tablets by mouth every 4 (four) hours as needed for severe pain. Patient not taking: Reported on 12/17/2014 12/11/14   Debby Freiberg, MD   BP 187/72 mmHg  Pulse 83  Temp(Src) 98.9 F (37.2 C) (Oral)  Resp 19  Ht 6' (1.829 m)  Wt 214 lb (97.07 kg)  BMI 29.02 kg/m2  SpO2 94% Physical Exam  Constitutional: She is oriented to person, place, and time. She appears well-developed and well-nourished. No distress.  HENT:  Head: Normocephalic and atraumatic.  Mouth/Throat: Oropharynx is clear and moist. No oropharyngeal exudate.  Eyes: Right eye exhibits no discharge. Left eye exhibits no discharge. No scleral icterus.  Neck: Normal range of motion.  Cardiovascular: Normal rate, regular rhythm and normal heart sounds.   No murmur heard. Pulmonary/Chest: Effort normal and breath sounds normal. No respiratory distress.  Abdominal: Soft. There is no tenderness.  Musculoskeletal: Normal range of motion. She exhibits no edema or tenderness.  Neurological: She is alert and oriented to person, place, and time. No cranial nerve deficit. Coordination normal.  Skin: Skin is warm and dry. No rash noted. She is not diaphoretic.  Psychiatric: She has a normal mood and affect.  Nursing note and vitals reviewed.   ED Course  Procedures (including critical care time) Labs Review Labs Reviewed  CBC - Abnormal; Notable for the following:    WBC 10.9 (*)    RBC 5.51 (*)    Hemoglobin 15.6 (*)    HCT 48.1 (*)    All other components within normal limits  COMPREHENSIVE METABOLIC PANEL - Abnormal; Notable for the following:    Sodium 151 (*)    Glucose, Bld 648 (*)    BUN 36 (*)    Creatinine, Ser 1.35 (*)    Albumin 2.7 (*)    Alkaline Phosphatase 120 (*)    GFR calc non Af Amer 42 (*)    GFR calc Af Amer 49 (*)    Anion gap 16 (*)    All other components within normal limits  URINALYSIS, ROUTINE W REFLEX MICROSCOPIC -  Abnormal; Notable for the following:    APPearance CLOUDY (*)    Glucose, UA >1000 (*)    Hgb urine dipstick LARGE (*)    Ketones, ur 40 (*)    Protein, ur 30 (*)    Nitrite POSITIVE (*)    Leukocytes, UA TRACE (*)    All other components within normal limits  URINE MICROSCOPIC-ADD ON - Abnormal; Notable for the following:    Squamous Epithelial / LPF FEW (*)    Bacteria, UA MANY (*)    All other components within normal limits  CBG MONITORING, ED - Abnormal; Notable for the following:    Glucose-Capillary 507 (*)    All other components within normal limits  CBG MONITORING, ED - Abnormal; Notable for the following:    Glucose-Capillary 405 (*)  All other components within normal limits  URINE CULTURE  LIPASE, BLOOD  BASIC METABOLIC PANEL    Imaging Review No results found.   EKG Interpretation None      MDM   Final diagnoses:  Diabetic ketoacidosis without coma associated with type 2 diabetes mellitus    Patient here with hyperglycemia and nausea and vomiting. Symptomatic therapy in the ER with fluid replacement. Patient appears to be in DKA on evaluation or lab work. Will admit patient to stepdown for DKA. Patient admitted under Dr. Tyrell Antonio.  The patient appears reasonably stabilized for admission considering the current resources, flow, and capabilities available in the ED at this time, and I doubt any other [facility] requiring further screening and/or treatment in the ED prior to admission.  BP 187/72 mmHg  Pulse 83  Temp(Src) 98.9 F (37.2 C) (Oral)  Resp 19  Ht 6' (1.829 m)  Wt 214 lb (97.07 kg)  BMI 29.02 kg/m2  SpO2 94%  Signed,  Dahlia Bailiff, PA-C 4:54 PM  Patient discussed with Dr. Debby Freiberg, MD   Carrie Mew, PA-C 12/26/14 1654  Debby Freiberg, MD 12/27/14 [phone]

## 2014-12-27 LAB — BASIC METABOLIC PANEL
ANION GAP: 6 (ref 5–15)
ANION GAP: 6 (ref 5–15)
Anion gap: 6 (ref 5–15)
BUN: 27 mg/dL — ABNORMAL HIGH (ref 6–23)
BUN: 27 mg/dL — ABNORMAL HIGH (ref 6–23)
BUN: 26 mg/dL — ABNORMAL HIGH (ref 6–23)
CALCIUM: 8.8 mg/dL (ref 8.4–10.5)
CHLORIDE: 117 mmol/L — AB (ref 96–112)
CHLORIDE: 117 mmol/L — AB (ref 96–112)
CHLORIDE: 115 mmol/L — AB (ref 96–112)
CO2: 32 mmol/L (ref 19–32)
CO2: 32 mmol/L (ref 19–32)
CO2: 30 mmol/L (ref 19–32)
CREATININE: 0.93 mg/dL (ref 0.50–1.10)
CREATININE: 0.91 mg/dL (ref 0.50–1.10)
Calcium: 8.9 mg/dL (ref 8.4–10.5)
Calcium: 8.6 mg/dL (ref 8.4–10.5)
Creatinine, Ser: 0.87 mg/dL (ref 0.50–1.10)
GFR calc Af Amer: 79 mL/min — ABNORMAL LOW (ref >90)
GFR calc non Af Amer: 72 mL/min — ABNORMAL LOW (ref >90)
GFR, EST AFRICAN AMERICAN: 77 mL/min — AB (ref >90)
GFR, EST AFRICAN AMERICAN: 83 mL/min — AB (ref >90)
GFR, EST NON AFRICAN AMERICAN: 66 mL/min — AB (ref >90)
GFR, EST NON AFRICAN AMERICAN: 68 mL/min — AB (ref >90)
GLUCOSE: 363 mg/dL — AB (ref 70–99)
Glucose, Bld: 162 mg/dL — ABNORMAL HIGH (ref 70–99)
Glucose, Bld: 136 mg/dL — ABNORMAL HIGH (ref 70–99)
Potassium: 3.9 mmol/L (ref 3.5–5.1)
Potassium: 3.9 mmol/L (ref 3.5–5.1)
Potassium: 4.1 mmol/L (ref 3.5–5.1)
SODIUM: 155 mmol/L — AB (ref 135–145)
Sodium: 155 mmol/L — ABNORMAL HIGH (ref 135–145)
Sodium: 151 mmol/L — ABNORMAL HIGH (ref 135–145)

## 2014-12-27 LAB — GLUCOSE, CAPILLARY
GLUCOSE-CAPILLARY: 302 mg/dL — AB (ref 70–99)
Glucose-Capillary: 167 mg/dL — ABNORMAL HIGH (ref 70–99)
Glucose-Capillary: 143 mg/dL — ABNORMAL HIGH (ref 70–99)

## 2014-12-27 LAB — CBC
HEMATOCRIT: 42.8 % (ref 36.0–46.0)
Hemoglobin: 13.4 g/dL (ref 12.0–15.0)
MCH: 27.8 pg (ref 26.0–34.0)
MCHC: 31.3 g/dL (ref 30.0–36.0)
MCV: 88.8 fL (ref 78.0–100.0)
PLATELETS: 224 10*3/uL (ref 150–400)
RBC: 4.82 MIL/uL (ref 3.87–5.11)
RDW: 14.3 % (ref 11.5–15.5)
WBC: 10.9 10*3/uL — ABNORMAL HIGH (ref 4.0–10.5)

## 2014-12-27 LAB — CBG MONITORING, ED
GLUCOSE-CAPILLARY: 140 mg/dL — AB (ref 70–99)
GLUCOSE-CAPILLARY: 158 mg/dL — AB (ref 70–99)
GLUCOSE-CAPILLARY: 129 mg/dL — AB (ref 70–99)
GLUCOSE-CAPILLARY: 244 mg/dL — AB (ref 70–99)
Glucose-Capillary: 137 mg/dL — ABNORMAL HIGH (ref 70–99)
Glucose-Capillary: 143 mg/dL — ABNORMAL HIGH (ref 70–99)
Glucose-Capillary: 148 mg/dL — ABNORMAL HIGH (ref 70–99)
Glucose-Capillary: 189 mg/dL — ABNORMAL HIGH (ref 70–99)

## 2014-12-27 MED ORDER — INSULIN DETEMIR 100 UNIT/ML ~~LOC~~ SOLN
35.0000 [IU] | Freq: Two times a day (BID) | SUBCUTANEOUS | Status: DC
  Administered 2014-12-27 – 2014-12-28 (×2): 35 [IU] via SUBCUTANEOUS
  Filled 2014-12-27 (×5): qty 0.35

## 2014-12-27 MED ORDER — INSULIN ASPART 100 UNIT/ML ~~LOC~~ SOLN
4.0000 [IU] | Freq: Three times a day (TID) | SUBCUTANEOUS | Status: DC
  Administered 2014-12-27 – 2014-12-29 (×4): 4 [IU] via SUBCUTANEOUS

## 2014-12-27 MED ORDER — INSULIN ASPART 100 UNIT/ML ~~LOC~~ SOLN
0.0000 [IU] | Freq: Three times a day (TID) | SUBCUTANEOUS | Status: DC
  Administered 2014-12-27: 11 [IU] via SUBCUTANEOUS
  Administered 2014-12-27: 3 [IU] via SUBCUTANEOUS
  Administered 2014-12-28 – 2014-12-29 (×4): 5 [IU] via SUBCUTANEOUS
  Administered 2014-12-29: 3 [IU] via SUBCUTANEOUS

## 2014-12-27 NOTE — ED Notes (Signed)
Incontinent of large amount of urine.  Cleaned up and linen changed.  No distress noted or complaints voiced.

## 2014-12-27 NOTE — ED Notes (Signed)
Katherine [name], Network engineer, to page admitting - RN to inquire re: glucose stabilizer.

## 2014-12-27 NOTE — ED Notes (Signed)
DELAY W/TRANSPORTING PT TO FLOOR D/T HAVING TO WAIT ON PHARMACY TO SEND LEVEMIR. SPOKE W/PHARMACY X 3.

## 2014-12-27 NOTE — ED Notes (Signed)
No distress noted or complaints voiced at this time.  Glucose stabilizer remains in place.  Insulin adjusted as needed.  Encouraged to call for assistance as needed.

## 2014-12-27 NOTE — ED Notes (Signed)
Patient remains on glucose stabilizer at this time.  Latest CBG 140.  To continue to monitor.  No distress noted or complaints voiced at this time.

## 2014-12-27 NOTE — Progress Notes (Signed)
Pt admitted to the unit at 1025. Pt mental status is A&OX4. Pt oriented to room, staff, and call bell. Skin is intact. Full assessment charted in [facility]. Call bell within reach. Visitor guidelines reviewed w/ pt and/or family.

## 2014-12-27 NOTE — ED Notes (Signed)
O2 applied to pt via n/c at 2L/min d/t pt's sats 88-90 when sleeping. 95% on O2.

## 2014-12-27 NOTE — Progress Notes (Signed)
[facility] PROGRESS NOTE  Katherine [name] ZTI:[id number] DOB: [birth year] DOA: 12/26/2014 PCP: Lorayne Marek, MD  Assessment/Plan: 1-Probably DKA, Uncontrolled Diabetes;  Patient with Blood sugar in the 600 range, gap at 16, although bicarb at 27, positive ketone in urine.  DKA resolved, gap close.  Transition to levemir 35 BID.  Meal coverage and SSI.   2-Hypernatremia; received half NS.. Encourage water intake. Follow B-met. trending down.   3-HTN; continue with Norvasc. Hydralazine PRN.   4-UTI; UA with positive nitrates, WBC 21-50. Patient in DKA.  Continue with  Ceftriaxone.  Follow up Urine culture.   5-AKI; in setting dehydration. Improved with IV fluids.   Code Status: full code.  Family Communication: care discussed with patient.  Disposition Plan: correction of hyperglycemia, hypernatremia. Needs PT.   Consultants:  none  Procedures:  none  Antibiotics:  Ceftriaxone 2-6  HPI/Subjective: She is feeling better, less nauseous.  Willing to eat. She understand that she needs to drink plenty of water.   Objective: Filed Vitals:   12/27/14 1334  BP: 159/70  Pulse: 90  Temp: 99 F (37.2 C)  Resp: 18    Intake/Output Summary (Last 24 hours) at 12/27/14 1347 Last data filed at 12/27/14 0957  Gross per 24 hour  Intake      0 ml  Output      0 ml  Net      0 ml   Filed Weights   12/26/14 1206  Weight: 97.07 kg (214 lb)    Exam:   General:  Alert in no distress.   Cardiovascular: S 1, S 2 RRR  Respiratory: CTA  Abdomen: BS present, soft, NT  Musculoskeletal: no edema.   Data Reviewed: Basic Metabolic Panel:  Recent Labs Lab 12/26/14 1910 12/26/14 2245 12/27/14 0308 12/27/14 0635 12/27/14 1109  NA 155* 154* 155* 155* 151*  K 3.9 4.2 3.9 3.9 4.1  CL 116* 119* 117* 117* 115*  CO2 26 26 32 32 30  GLUCOSE 425* 274* 162* 136* 363*  BUN 28* 29* 27* 27* 26*  CREATININE 1.14* 0.85 0.93 0.87 0.91  CALCIUM 9.0 9.0 8.8 8.9 8.6    Liver Function Tests:  Recent Labs Lab 12/22/14 1529 12/26/14 1210  AST 13 11  ALT 11 11  ALKPHOS 122* 120*  BILITOT 1.6* 1.1  PROT 7.6 8.0  ALBUMIN 3.0* 2.7*    Recent Labs Lab 12/26/14 1210  LIPASE 18   No results for input(s): AMMONIA in the last 168 hours. CBC:  Recent Labs Lab 12/22/14 1529 12/26/14 1210 12/27/14 0519  WBC 13.6* 10.9* 10.9*  NEUTROABS 12.1*  --   --   HGB 14.5 15.6* 13.4  HCT 42.5 48.1* 42.8  MCV 84.5 87.3 88.8  PLT 180 230 224   Cardiac Enzymes: No results for input(s): CKTOTAL, CKMB, CKMBINDEX, TROPONINI in the last 168 hours. BNP (last 3 results) No results for input(s): BNP in the last 8760 hours.  ProBNP (last 3 results) No results for input(s): PROBNP in the last 8760 hours.  CBG:  Recent Labs Lab 12/27/14 0511 12/27/14 0618 12/27/14 0725 12/27/14 0922 12/27/14 1144  GLUCAP 143* 129* 148* 244* 302*    Recent Results (from the past 240 hour(s))  Urine culture     Status: None   Collection Time: 12/22/14  6:17 PM  Result Value Ref Range Status   Specimen Description URINE, CLEAN CATCH  Final   Special Requests Normal  Final   Colony Count   Final    >=  100,000 COLONIES/ML Performed at [employer] [facility]   Final    Multiple bacterial morphotypes present, none predominant. Suggest appropriate recollection if clinically indicated. Performed at [facility]    Report Status 12/23/2014 FINAL  Final     Studies: No results found.  Scheduled Meds: . amLODipine  10 mg Oral Daily  . cefTRIAXone (ROCEPHIN)  IV  1 g Intravenous Q24H  . enoxaparin (LOVENOX) injection  40 mg Subcutaneous Q24H  . gabapentin  300 mg Oral QHS  . insulin aspart  0-15 Units Subcutaneous TID WC  . insulin detemir  35 Units Subcutaneous BID  . multivitamin with minerals  1 tablet Oral Daily   Continuous Infusions:   Principal Problem:   DKA (diabetic ketoacidoses) Active Problems:   Diabetes mellitus type 2 with  complications, uncontrolled   HYPERCHOLESTEROLEMIA   HYPERTENSION, BENIGN ESSENTIAL   Gastroparesis   UTI (lower urinary tract infection)    Time spent: 35 minutes.     Niel Hummer A  [facility] Pager [phone]. If 7PM-7AM, please contact night-coverage at [email or web address], password [facility] 12/27/2014, 1:47 PM  LOS: 1 day

## 2014-12-27 NOTE — ED Notes (Signed)
Insulin infusion d/c'd d/t pt's CBG's.

## 2014-12-27 NOTE — ED Notes (Signed)
Resting quietly at this time with no distress noted or complaints voiced.

## 2014-12-27 NOTE — ED Notes (Addendum)
Inpatient physician made aware of CBG result of 129 and Anion Gap of 6.  States I will reassess after the next BMP.  Phlebotomy at the bedside drawing blood at this time.

## 2014-12-28 LAB — BASIC METABOLIC PANEL
ANION GAP: 9 (ref 5–15)
ANION GAP: 4 — AB (ref 5–15)
BUN: 19 mg/dL (ref 6–23)
BUN: 17 mg/dL (ref 6–23)
CHLORIDE: 105 mmol/L (ref 96–112)
CO2: 29 mmol/L (ref 19–32)
CO2: 34 mmol/L — AB (ref 19–32)
Calcium: 8.7 mg/dL (ref 8.4–10.5)
Calcium: 8.3 mg/dL — ABNORMAL LOW (ref 8.4–10.5)
Chloride: 113 mmol/L — ABNORMAL HIGH (ref 96–112)
Creatinine, Ser: 0.88 mg/dL (ref 0.50–1.10)
Creatinine, Ser: 0.83 mg/dL (ref 0.50–1.10)
GFR calc Af Amer: 88 mL/min — ABNORMAL LOW (ref >90)
GFR calc non Af Amer: 76 mL/min — ABNORMAL LOW (ref >90)
GFR, EST AFRICAN AMERICAN: 82 mL/min — AB (ref >90)
GFR, EST NON AFRICAN AMERICAN: 71 mL/min — AB (ref >90)
GLUCOSE: 206 mg/dL — AB (ref 70–99)
Glucose, Bld: 240 mg/dL — ABNORMAL HIGH (ref 70–99)
Potassium: 3.9 mmol/L (ref 3.5–5.1)
Potassium: 3.5 mmol/L (ref 3.5–5.1)
Sodium: 151 mmol/L — ABNORMAL HIGH (ref 135–145)
Sodium: 143 mmol/L (ref 135–145)

## 2014-12-28 LAB — CBC
HCT: 45.2 % (ref 36.0–46.0)
Hemoglobin: 14 g/dL (ref 12.0–15.0)
MCH: 28.2 pg (ref 26.0–34.0)
MCHC: 31 g/dL (ref 30.0–36.0)
MCV: 91.1 fL (ref 78.0–100.0)
Platelets: 204 10*3/uL (ref 150–400)
RBC: 4.96 MIL/uL (ref 3.87–5.11)
RDW: 14.2 % (ref 11.5–15.5)
WBC: 9.7 10*3/uL (ref 4.0–10.5)

## 2014-12-28 LAB — GLUCOSE, CAPILLARY: GLUCOSE-CAPILLARY: 229 mg/dL — AB (ref 70–99)

## 2014-12-28 MED ORDER — SODIUM CHLORIDE 0.45 % IV SOLN
INTRAVENOUS | Status: DC
  Administered 2014-12-28: 12:00:00 via INTRAVENOUS

## 2014-12-28 MED ORDER — LIVING WELL WITH DIABETES BOOK
Freq: Once | Status: AC
  Administered 2014-12-28: 15:00:00
  Filled 2014-12-28: qty 1

## 2014-12-28 MED ORDER — INSULIN DETEMIR 100 UNIT/ML ~~LOC~~ SOLN
30.0000 [IU] | Freq: Two times a day (BID) | SUBCUTANEOUS | Status: DC
  Administered 2014-12-29: 30 [IU] via SUBCUTANEOUS
  Filled 2014-12-28 (×3): qty 0.3

## 2014-12-28 NOTE — Progress Notes (Signed)
Utilization review completed. Glynnis Gavel, RN, BSN. 

## 2014-12-28 NOTE — Progress Notes (Signed)
[facility] PROGRESS NOTE  Katherine [name] IRW:[id number] DOB: [birth year] DOA: 12/26/2014 PCP: Lorayne Marek, MD  Assessment/Plan: 1-Probably DKA, Uncontrolled Diabetes;  Patient with Blood sugar in the 600 range, gap at 16, although bicarb at 27, positive ketone in urine.  DKA resolved, gap close.  levemir 30 BID.  Meal coverage and SSI.  Will discontinue Farxiga at discharge due to associated risk for DKA.   2-Hypernatremia; received half NS.. Encourage water intake. Resume half NS fluids, repeat b-met this afternoon.   3-HTN; continue with Norvasc. Hydralazine PRN.   4-UTI; UA with positive nitrates, WBC 21-50. Patient in DKA.  Continue with  Ceftriaxone.  Follow up Urine culture. Growing E coli. Sensitivity pending.   5-AKI; in setting dehydration. Improved with IV fluids.   Code Status: full code.  Family Communication: care discussed with patient.  Disposition Plan: correction of hyperglycemia, hypernatremia.   Consultants:  none  Procedures:  none  Antibiotics:  Ceftriaxone 2-6  HPI/Subjective: She feels bad when her blood sugar is below 200. She is willing to drink fluids.   Objective: Filed Vitals:   12/28/14 1434  BP: 156/72  Pulse:   Temp: 99.8 F (37.7 C)  Resp: 16    Intake/Output Summary (Last 24 hours) at 12/28/14 1646 Last data filed at 12/28/14 1512  Gross per 24 hour  Intake    390 ml  Output    500 ml  Net   -110 ml   Filed Weights   12/26/14 1206 12/28/14 0555  Weight: 97.07 kg (214 lb) 86.1 kg (189 lb 13.1 oz)    Exam:   General:  Alert in no distress.   Cardiovascular: S 1, S 2 RRR  Respiratory: CTA  Abdomen: BS present, soft, NT  Musculoskeletal: no edema.   Data Reviewed: Basic Metabolic Panel:  Recent Labs Lab 12/26/14 2245 12/27/14 0308 12/27/14 0635 12/27/14 1109 12/28/14 0615  NA 154* 155* 155* 151* 151*  K 4.2 3.9 3.9 4.1 3.9  CL 119* 117* 117* 115* 113*  CO2 26 32 32 30 29  GLUCOSE 274* 162*  136* 363* 206*  BUN 29* 27* 27* 26* 19  CREATININE 0.85 0.93 0.87 0.91 0.88  CALCIUM 9.0 8.8 8.9 8.6 8.7   Liver Function Tests:  Recent Labs Lab 12/22/14 1529 12/26/14 1210  AST 13 11  ALT 11 11  ALKPHOS 122* 120*  BILITOT 1.6* 1.1  PROT 7.6 8.0  ALBUMIN 3.0* 2.7*    Recent Labs Lab 12/26/14 1210  LIPASE 18   No results for input(s): AMMONIA in the last 168 hours. CBC:  Recent Labs Lab 12/22/14 1529 12/26/14 1210 12/27/14 0519 12/28/14 0615  WBC 13.6* 10.9* 10.9* 9.7  NEUTROABS 12.1*  --   --   --   HGB 14.5 15.6* 13.4 14.0  HCT 42.5 48.1* 42.8 45.2  MCV 84.5 87.3 88.8 91.1  PLT 180 230 224 204   Cardiac Enzymes: No results for input(s): CKTOTAL, CKMB, CKMBINDEX, TROPONINI in the last 168 hours. BNP (last 3 results) No results for input(s): BNP in the last 8760 hours.  ProBNP (last 3 results) No results for input(s): PROBNP in the last 8760 hours.  CBG:  Recent Labs Lab 12/27/14 0922 12/27/14 1144 12/27/14 1658 12/27/14 2142 12/28/14 0751  GLUCAP 244* 302* 167* 143* 229*    Recent Results (from the past 240 hour(s))  Urine culture     Status: None   Collection Time: 12/22/14  6:17 PM  Result Value Ref Range Status  Specimen Description URINE, CLEAN CATCH  Final   Special Requests Normal  Final   Colony Count   Final    >=100,000 COLONIES/ML Performed at [facility]    Culture   Final    Multiple bacterial morphotypes present, none predominant. Suggest appropriate recollection if clinically indicated. Performed at [facility]    Report Status 12/23/2014 FINAL  Final  Urine culture     Status: None (Preliminary result)   Collection Time: 12/26/14  2:22 PM  Result Value Ref Range Status   Specimen Description URINE, CLEAN CATCH  Final   Special Requests NONE  Final   Colony Count   Final    >=100,000 COLONIES/ML Performed at [facility]    Culture   Final    ESCHERICHIA COLI Performed at [facility]    Report Status PENDING  Incomplete     Studies: No results found.  Scheduled Meds: . amLODipine  10 mg Oral Daily  . cefTRIAXone (ROCEPHIN)  IV  1 g Intravenous Q24H  . enoxaparin (LOVENOX) injection  40 mg Subcutaneous Q24H  . gabapentin  300 mg Oral QHS  . insulin aspart  0-15 Units Subcutaneous TID WC  . insulin aspart  4 Units Subcutaneous TID WC  . insulin detemir  30 Units Subcutaneous BID  . multivitamin with minerals  1 tablet Oral Daily   Continuous Infusions: . sodium chloride 100 mL/hr at 12/28/14 1154    Principal Problem:   DKA (diabetic ketoacidoses) Active Problems:   Diabetes mellitus type 2 with complications, uncontrolled   HYPERCHOLESTEROLEMIA   HYPERTENSION, BENIGN ESSENTIAL   Gastroparesis   UTI (lower urinary tract infection)    Time spent: 35 minutes.     Niel Hummer A  [facility] Pager [phone]. If 7PM-7AM, please contact night-coverage at [email or web address], password [facility] 12/28/2014, 4:46 PM  LOS: 2 days

## 2014-12-28 NOTE — Evaluation (Signed)
Occupational Therapy Evaluation Patient Details Name: NEIVA MAENZA MRN: [id number] DOB: 1956-07-01 Today's Date: 12/28/2014    History of Present Illness Pt admit with DKA.     Clinical Impression   Pt was performing self care and transfers at a modified independent level prior to admission. She routinely used a BSC.  Pt presents with generalized weakness, fatigue and impaired balance interfering with ability to perform ADL.  Pt has a supportive husband to assist her as needed at home.  Will follow acutely.    Follow Up Recommendations  No OT follow up    Equipment Recommendations  None recommended by OT    Recommendations for Other Services       Precautions / Restrictions Precautions Precautions: Fall Restrictions Weight Bearing Restrictions: No      Mobility Bed Mobility Overal bed mobility: Needs Assistance Bed Mobility: Supine to Sit;Sit to Supine     Supine to sit: Supervision;HOB elevated Sit to supine: Supervision;HOB elevated     Transfers Overall transfer level: Needs assistance   Transfers: Sit to/from Stand;Stand Pivot Transfers Sit to Stand: Min assist Stand pivot transfers: Min assist       General transfer comment: One hand held    Balance                                            ADL Overall ADL's : Needs assistance/impaired Eating/Feeding: Independent;Sitting   Grooming: Standing;Wash/dry hands;Min guard   Upper Body Bathing: Set up;Sitting   Lower Body Bathing: Sit to/from stand;Minimal assistance   Upper Body Dressing : Set up;Sitting   Lower Body Dressing: Sit to/from stand;Minimal assistance   Toilet Transfer: Stand-pivot;BSC;Minimal assistance   Toileting- Clothing Manipulation and Hygiene: Sit to/from stand;Minimal assistance       Functional mobility during ADLs: Minimal assistance (one hand held)       Vision                     Perception     Praxis      Pertinent Vitals/Pain  Pain Assessment: No/denies pain     Hand Dominance Left   Extremity/Trunk Assessment Upper Extremity Assessment Upper Extremity Assessment: Overall WFL for tasks assessed   Lower Extremity Assessment Lower Extremity Assessment: Defer to PT evaluation   Cervical / Trunk Assessment Cervical / Trunk Assessment: Normal   Communication Communication Communication: No difficulties   Cognition Arousal/Alertness: Awake/alert Behavior During Therapy: WFL for tasks assessed/performed Overall Cognitive Status: Within Functional Limits for tasks assessed                     General Comments       Exercises      Shoulder Instructions      Home Living Family/patient expects to be discharged to:: Private residence Living Arrangements: Spouse/significant other Available Help at Discharge: Family;Available 24 hours/day Type of Home: House Home Access: Stairs to enter [employer] of Steps: 8 Entrance Stairs-Rails: Right;Left;Can reach both Home Layout: One level     Bathroom Shower/Tub: Teacher, early years/pre: Standard     Home Equipment: Environmental consultant - standard;Grab bars - tub/shower;Shower seat;Bedside commode          Prior Functioning/Environment Level of Independence: Independent with assistive device(s)             OT Diagnosis: Generalized weakness   OT  Problem List: Decreased strength;Decreased activity tolerance;Impaired balance (sitting and/or standing);Decreased knowledge of use of DME or AE   OT Treatment/Interventions: Self-care/ADL training;DME and/or AE instruction;Patient/family education;Therapeutic activities    OT Goals(Current goals can be found in the care plan section) Acute Rehab OT Goals Patient Stated Goal: to go home OT Goal Formulation: With patient Time For Goal Achievement: 01/04/15 Potential to Achieve Goals: Good  OT Frequency: Min 2X/week   Barriers to D/C:            Co-evaluation               End of Session Equipment Utilized During Treatment: Gait belt  Activity Tolerance: Patient tolerated treatment well Patient left: in bed;with call bell/phone within reach   Time: 1450-1511 OT Time Calculation (min): 21 min Charges:  OT General Charges $OT Visit: 1 Procedure OT Evaluation $Initial OT Evaluation Tier I: 1 Procedure G-Codes:    Malka So 12/28/2014, 3:49 PM  [phone]

## 2014-12-28 NOTE — Progress Notes (Signed)
Patient refused Levemir administration. Stated to RN that "that's why I feel like a zombie. I feel that way any time my sugar is less than 200." Patient has been very drowsy this shift, but does awaken and respond to voice. Oriented x4. Will continue to monitor.

## 2014-12-28 NOTE — Evaluation (Signed)
Physical Therapy Evaluation Patient Details Name: Katherine [name] MRN: [id number] DOB: 09/02/1956 Today's Date: 12/28/2014   History of Present Illness  Pt admit with DKA.    Clinical Impression  Pt admitted with above diagnosis. Pt currently with functional limitations due to the deficits listed below (see PT Problem List). Pt currently needing min guard to min assist to ambulate.  Husband can provide assist at home.  Pt has a standard walker if needed.  Gavept an exercise program for home as husband states he will have to do her therapy as she has no insurance.  Educated re: program.  Continue PT acutely.   Pt will benefit from skilled PT to increase their independence and safety with mobility to allow discharge to the venue listed below.      Follow Up Recommendations No PT follow up;Supervision/Assistance - 24 hour    Equipment Recommendations  None recommended by PT    Recommendations for Other Services       Precautions / Restrictions Precautions Precautions: Fall Restrictions Weight Bearing Restrictions: No      Mobility  Bed Mobility               General bed mobility comments: Pt in chair.   Transfers Overall transfer level: Needs assistance   Transfers: Sit to/from Stand Sit to Stand: Min assist;+2 physical assistance         General transfer comment: Husband present and he got on one side and this PT on the other to assist pt to stand up.    Ambulation/Gait Ambulation/Gait assistance: Min assist;+2 physical assistance Ambulation Distance (Feet): 65 Feet Assistive device: 2 person hand held assist;1 person hand held assist Gait Pattern/deviations: Step-through pattern;Decreased stride length   Gait velocity interpretation: Below normal speed for age/gender General Gait Details: Pt with slow and guarded gait.  Pt without LOB but did need HHA for stability.  Ambulated to bathroom and back.    Stairs            Wheelchair Mobility    Modified  Rankin (Stroke Patients Only)       Balance                                             Pertinent Vitals/Pain Pain Assessment: No/denies pain  VSS    Home Living Family/patient expects to be discharged to:: Private residence Living Arrangements: Spouse/significant other Available Help at Discharge: Family;Available 24 hours/day Type of Home: House Home Access: Stairs to enter Entrance Stairs-Rails: Right;Left;Can reach both Entrance Stairs-Number of Steps: 8 Home Layout: One level Home Equipment: Walker - standard      Prior Function Level of Independence: Independent               Hand Dominance        Extremity/Trunk Assessment   Upper Extremity Assessment: Defer to OT evaluation           Lower Extremity Assessment: Generalized weakness      Cervical / Trunk Assessment: Normal  Communication   Communication: No difficulties  Cognition Arousal/Alertness: Awake/alert Behavior During Therapy: WFL for tasks assessed/performed Overall Cognitive Status: Within Functional Limits for tasks assessed                      General Comments      Exercises General Exercises - Lower Extremity Short Arc  Quad: AROM;Both;10 reps;Supine Heel Slides: AROM;Both;10 reps;Standing;Supine Hip ABduction/ADduction: AROM;Both;10 reps;Standing;Supine Hip Flexion/Marching: AROM;Both;Supine;Seated;10 reps Toe Raises: AROM;Both;10 reps;Standing Mini-Sqauts: AROM;Both;10 reps;Standing      Assessment/Plan    PT Assessment Patient needs continued PT services  PT Diagnosis Generalized weakness   PT Problem List Decreased activity tolerance;Decreased balance;Decreased mobility;Decreased knowledge of use of DME;Decreased safety awareness;Decreased knowledge of precautions  PT Treatment Interventions DME instruction;Gait training;Functional mobility training;Therapeutic activities;Therapeutic exercise;Balance training;Patient/family education    PT Goals (Current goals can be found in the Care Plan section) Acute Rehab PT Goals Patient Stated Goal: to go home PT Goal Formulation: With patient Time For Goal Achievement: 01/04/15 Potential to Achieve Goals: Good    Frequency Min 3X/week   Barriers to discharge        Co-evaluation               End of Session Equipment Utilized During Treatment: Gait belt Activity Tolerance: Patient limited by fatigue Patient left: in chair;with call bell/phone within reach;with family/visitor present Nurse Communication: Mobility status         Time: 1010-1038 PT Time Calculation (min) (ACUTE ONLY): 28 min   Charges:   PT Evaluation $Initial PT Evaluation Tier I: 1 Procedure PT Treatments $Gait Training: 8-22 mins   PT G CodesIrwin Brakeman F 2015/01/14, 1:09 PM Katherine [name],PT Acute Rehabilitation [phone] (pager)

## 2014-12-28 NOTE — Progress Notes (Signed)
Inpatient Diabetes Program Recommendations  AACE/ADA: New Consensus Statement on Inpatient Glycemic Control (2013)  Target Ranges:  Prepandial:   less than 140 mg/dL      Peak postprandial:   less than 180 mg/dL (1-2 hours)      Critically ill patients:  140 - 180 mg/dL   Reason for Assessment:  Referral received Results for Katherine [name], Katherine [name] (MRN [id number]) as of 12/28/2014 15:59  Ref. Range 12/27/2014 07:25 12/27/2014 09:22 12/27/2014 11:44 12/27/2014 16:58 12/27/2014 21:42 12/28/2014 07:51  Glucose-Capillary Latest Range: 70-99 mg/dL 148 (H) 244 (H) 302 (H) 167 (H) 143 (H) 229 (H)  Results for Katherine [name], Katherine [name] (MRN [id number]) as of 12/28/2014 15:59  Ref. Range 12/01/2014 12:48  Hgb A1c MFr Bld Latest Range: <5.7 % >15.0   Diabetes history: Type 2 diabetes Outpatient Diabetes medications: Farxiga 10 mg daily (started recently at [facility]), Levemir 50 units bid, Novolog 0-5 units fours times daily Current orders for Inpatient glycemic control:  Levemir 30 units bid, Novolog moderate tid with meals, Novolog 4 units tid with meals  Note that patient was recently started on Farxiga on 12/01/14 which is an SGLT-2 inhibitor.  Called and discussed with MD and recommended d/c of this medication due to increased risk for DKA.  Discussed sick day rules with patient including the need to take insulin even when sick.  She states that she felt so bad that she did not take her insulin and that her husband is afraid of needles. Discussed sick day rules with patient including the need to call the MD if she is unable to eat. Discussed dehydration and the need to discuss insulin doses with MD.  She see's MD at [facility].  She also obtains her medications from the [facility] including the meter/strips. Still needs reinforcement of diabetes information.  She has "Living Well with Diabetes" book at her bedside.    Discussed with RN.    Thanks, Adah Perl, RN, BC-ADM Inpatient Diabetes Coordinator Pager [phone]

## 2014-12-29 DIAGNOSIS — K3184 Gastroparesis: Secondary | ICD-10-CM

## 2014-12-29 LAB — BASIC METABOLIC PANEL
Anion gap: 10 (ref 5–15)
BUN: 11 mg/dL (ref 6–23)
CO2: 27 mmol/L (ref 19–32)
Calcium: 8.4 mg/dL (ref 8.4–10.5)
Chloride: 105 mmol/L (ref 96–112)
Creatinine, Ser: 0.64 mg/dL (ref 0.50–1.10)
GFR calc Af Amer: >90 mL/min (ref >90)
Glucose, Bld: 164 mg/dL — ABNORMAL HIGH (ref 70–99)
Potassium: 3.4 mmol/L — ABNORMAL LOW (ref 3.5–5.1)
Sodium: 142 mmol/L (ref 135–145)

## 2014-12-29 LAB — GLUCOSE, CAPILLARY
GLUCOSE-CAPILLARY: 258 mg/dL — AB (ref 70–99)
GLUCOSE-CAPILLARY: 218 mg/dL — AB (ref 70–99)
Glucose-Capillary: 201 mg/dL — ABNORMAL HIGH (ref 70–99)

## 2014-12-29 LAB — URINE CULTURE

## 2014-12-29 MED ORDER — CEPHALEXIN 500 MG PO CAPS: 500.0000 mg | ORAL_CAPSULE | Freq: Four times a day (QID) | ORAL | Status: DC

## 2014-12-29 MED ORDER — INSULIN DETEMIR 100 UNIT/ML ~~LOC~~ SOLN
30.0000 [IU] | Freq: Two times a day (BID) | SUBCUTANEOUS | Status: DC
Start: 2014-12-29 — End: 2015-01-06

## 2014-12-29 MED ORDER — POTASSIUM CHLORIDE CRYS ER 20 MEQ PO TBCR
40.0000 meq | EXTENDED_RELEASE_TABLET | Freq: Once | ORAL | Status: AC
  Administered 2014-12-29: 40 meq via ORAL
  Filled 2014-12-29: qty 2

## 2014-12-29 MED ORDER — ONDANSETRON HCL 4 MG PO TABS: 4.0000 mg | ORAL_TABLET | Freq: Four times a day (QID) | ORAL | Status: DC | PRN

## 2014-12-29 NOTE — Progress Notes (Signed)
Eduardo G Harney to be D/C'd Home per MD order.  Discussed with the patient and all questions fully answered.    Medication List    STOP taking these medications        dapagliflozin propanediol 10 MG Tabs tablet  Commonly known as:  FARXIGA     diazepam 5 MG tablet  Commonly known as:  VALIUM     promethazine 25 MG tablet  Commonly known as:  PHENERGAN      TAKE these medications        amLODipine 10 MG tablet  Commonly known as:  NORVASC  Take 1 tablet (10 mg total) by mouth daily.     cephALEXin 500 MG capsule  Commonly known as:  KEFLEX  Take 1 capsule (500 mg total) by mouth 4 (four) times daily.     chlorhexidine 0.12 % solution  Commonly known as:  PERIDEX  Use as directed 15 mLs in the mouth or throat 2 (two) times daily.     gabapentin 300 MG capsule  Commonly known as:  NEURONTIN  Take 300 mg by mouth at bedtime.     insulin aspart 100 UNIT/ML injection  Commonly known as:  novoLOG  - Inject 0-5 Units into the skin at bedtime. CBG 70 - 120: 0 units  - CBG 121 - 150: 0 units  - CBG 151 - 200: 0 units  - CBG 201 - 250: 2 units  - CBG 251 - 300: 3 units  - CBG 301 - 350: 4 units  - CBG 351 - 400: 5 units     insulin detemir 100 UNIT/ML injection  Commonly known as:  LEVEMIR  Inject 0.3 mLs (30 Units total) into the skin 2 (two) times daily.     losartan 25 MG tablet  Commonly known as:  COZAAR  Take 1 tablet (25 mg total) by mouth daily.     metFORMIN 500 MG tablet  Commonly known as:  GLUCOPHAGE  Take 2 tablets (1,000 mg total) by mouth 2 (two) times daily. Patient states she takes 1000mg  twice a day per patient     multivitamin with minerals Tabs tablet  Take 1 tablet by mouth daily.     ondansetron 4 MG tablet  Commonly known as:  ZOFRAN  Take 1 tablet (4 mg total) by mouth every 6 (six) hours as needed for nausea.     oxyCODONE-acetaminophen 5-325 MG per tablet  Commonly known as:  PERCOCET/ROXICET  Take 1-2 tablets by mouth every 4  (four) hours as needed for severe pain.        VVS, Skin clean, dry and intact without evidence of skin break down, no evidence of skin tears noted. IV catheter discontinued intact. Site without signs and symptoms of complications. Dressing and pressure applied.  An After Visit Summary was printed and given to the patient.  D/c education completed with patient/family including follow up instructions, medication list, d/c activities limitations if indicated, with other d/c instructions as indicated by MD - patient able to verbalize understanding, all questions fully answered.   Patient instructed to return to ED, call 911, or call MD for any changes in condition.   Patient escorted via [place], and D/C home via private auto.  Juanice Warburton D 12/29/2014 2:19 PM

## 2014-12-29 NOTE — Progress Notes (Signed)
CARE MANAGEMENT NOTE 12/29/2014  Patient:  Katherine [name], Katherine [name]   Account Number:  [id number]  Date Initiated:  12/29/2014  Documentation initiated by:  [facility]  Subjective/Objective Assessment:   DKA     Action/Plan:   lives at home with husband, pcp Dr. Lorayne Marek at [facility]   Anticipated DC Date:  12/29/2014   Anticipated DC Plan:  [place]  CM consult      [place]   Choice offered to / List presented to:  C-1 Patient        [place] arranged  HH-1 RN      [place].   Status of service:  Completed, signed off Medicare Important Message given?  NO (If response is "NO", the following Medicare IM given date fields will be blank) Date Medicare IM given:   Medicare IM given by:   Date Additional Medicare IM given:   Additional Medicare IM given by:    Discharge Disposition:  [place]  Per UR Regulation:    If discussed at Long Length of Stay Meetings, dates discussed:    Comments:  12/29/2014 1128 NCM spoke to pt and states she does have a glucometer at home. Provided pt with [facility] brochure with appt on 2/17 at 315 pm. States her orange card has expired and she was in the process of completing paperwork to renew. Contacted [facility] for [facility] RN for scheduled dc home today. Jonnie Finner RN CCM Case Mgmt phone [phone]
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
## 2014-12-29 NOTE — Plan of Care (Signed)
Problem: Food- and Nutrition-Related Knowledge Deficit (NB-1.1) Goal: Nutrition education Formal process to instruct or train a patient/client in a skill or to impart knowledge to help patients/clients voluntarily manage or modify food choices and eating behavior to maintain or improve health. Outcome: Adequate for Discharge  RD consulted for nutrition education regarding diabetes.     Lab Results  Component Value Date    HGBA1C >15.0 12/01/2014    RD provided "Carbohydrate Counting for People with Diabetes" handout from the Academy of [employer]. Discussed different food groups and their effects on blood sugar, emphasizing carbohydrate-containing foods. Provided list of carbohydrates and recommended serving sizes of common foods.  Discussed importance of controlled and consistent carbohydrate intake throughout the day. Provided examples of ways to balance meals/snacks and encouraged intake of high-fiber, whole grain complex carbohydrates. Teach back method used.  Expect fair compliance.  Body mass index is 25.71 kg/(m^2). Pt meets criteria for overweight based on current BMI.  Current diet order is carb modified, patient is consuming approximately 50% of meals at this time. Labs and medications reviewed. No further nutrition interventions warranted at this time. RD contact information provided. If additional nutrition issues arise, please re-consult RD.  Katherine [name] A. Katherine [name], RD, LDN, CDE Pager: [phone] After hours Pager: [phone]

## 2014-12-30 LAB — GLUCOSE, CAPILLARY
GLUCOSE-CAPILLARY: 137 mg/dL — AB (ref 70–99)
GLUCOSE-CAPILLARY: 154 mg/dL — AB (ref 70–99)

## 2015-01-06 ENCOUNTER — Encounter: Payer: Self-pay | Admitting: Internal Medicine

## 2015-01-06 ENCOUNTER — Ambulatory Visit: Payer: Self-pay | Attending: Internal Medicine | Admitting: Internal Medicine

## 2015-01-06 VITALS — BP 163/77 | HR 82 | Temp 98.3°F | Resp 16 | Wt 205.8 lb

## 2015-01-06 DIAGNOSIS — E876 Hypokalemia: Secondary | ICD-10-CM

## 2015-01-06 DIAGNOSIS — N390 Urinary tract infection, site not specified: Secondary | ICD-10-CM

## 2015-01-06 DIAGNOSIS — E119 Type 2 diabetes mellitus without complications: Secondary | ICD-10-CM | POA: Insufficient documentation

## 2015-01-06 DIAGNOSIS — E139 Other specified diabetes mellitus without complications: Secondary | ICD-10-CM

## 2015-01-06 DIAGNOSIS — I10 Essential (primary) hypertension: Secondary | ICD-10-CM

## 2015-01-06 LAB — POCT GLYCOSYLATED HEMOGLOBIN (HGB A1C): HEMOGLOBIN A1C: 13.8

## 2015-01-06 LAB — GLUCOSE, POCT (MANUAL RESULT ENTRY): POC Glucose: 248 mg/dl — AB (ref 70–99)

## 2015-01-06 MED ORDER — AMLODIPINE BESYLATE 10 MG PO TABS: 10.0000 mg | ORAL_TABLET | Freq: Every day | ORAL | Status: DC

## 2015-01-06 MED ORDER — METFORMIN HCL 500 MG PO TABS: 1000.0000 mg | ORAL_TABLET | Freq: Two times a day (BID) | ORAL | Status: DC

## 2015-01-06 MED ORDER — INSULIN ASPART 100 UNIT/ML ~~LOC~~ SOLN: 0.0000 [IU] | Freq: Three times a day (TID) | SUBCUTANEOUS | Status: DC

## 2015-01-06 MED ORDER — LOSARTAN POTASSIUM 25 MG PO TABS: 25.0000 mg | ORAL_TABLET | Freq: Every day | ORAL | Status: DC

## 2015-01-06 MED ORDER — INSULIN DETEMIR 100 UNIT/ML ~~LOC~~ SOLN: 30.0000 [IU] | Freq: Two times a day (BID) | SUBCUTANEOUS | Status: DC

## 2015-01-06 MED ORDER — GABAPENTIN 300 MG PO CAPS: 300.0000 mg | ORAL_CAPSULE | Freq: Every day | ORAL | Status: DC

## 2015-01-06 NOTE — Progress Notes (Signed)
MRN: [id number] Name: Katherine [name]  Sex: female Age: 59 y.o. DOB: 1956-09-13  Allergies: Other  Chief Complaint  Patient presents with  . Hospitalization Follow-up    HPI: Patient is 59 y.o. female who has history of uncontrolled diabetes, hypertension, recently hospitalized with symptoms of weakness found to have elevated blood sugar level, EMR reviewed patient was found to be in DKA with anion gap of 16, her blood sugar was in 600 range and her urine ketones were positive, she was given IV fluids insulin subsequently discharged on Levemir 30 mg twice a day as well as meal coverage sliding scale insulin, her Wilder Glade was discontinued, her urine also showed positive nitrites and was treated with ceftriaxone and was discharged on Keflex as per patient she has not started taking the antibiotic yet, she is requesting refill on her medications, today her blood pressure is elevated as per patient she has not started taking losartan which she was prescribed.  Past Medical History  Diagnosis Date  . Diabetes mellitus   . Hypertension   . Boil of buttock     Past Surgical History  Procedure Laterality Date  . Appendectomy        Medication List       This list is accurate as of: 01/06/15  4:47 PM.  Always use your most recent med list.               amLODipine 10 MG tablet  Commonly known as:  NORVASC  Take 1 tablet (10 mg total) by mouth daily.     cephALEXin 500 MG capsule  Commonly known as:  KEFLEX  Take 1 capsule (500 mg total) by mouth 4 (four) times daily.     chlorhexidine 0.12 % solution  Commonly known as:  PERIDEX  Use as directed 15 mLs in the mouth or throat 2 (two) times daily.     gabapentin 300 MG capsule  Commonly known as:  NEURONTIN  Take 1 capsule (300 mg total) by mouth at bedtime.     insulin aspart 100 UNIT/ML injection  Commonly known as:  novoLOG  - Inject 0-5 Units into the skin 3 (three) times daily with meals. CBG 70 - 120: 0 units  -  CBG 121 - 150: 0 units  - CBG 151 - 200: 0 units  - CBG 201 - 250: 2 units  - CBG 251 - 300: 3 units  - CBG 301 - 350: 4 units  - CBG 351 - 400: 5 units     insulin detemir 100 UNIT/ML injection  Commonly known as:  LEVEMIR  Inject 0.3 mLs (30 Units total) into the skin 2 (two) times daily.     losartan 25 MG tablet  Commonly known as:  COZAAR  Take 1 tablet (25 mg total) by mouth daily.     metFORMIN 500 MG tablet  Commonly known as:  GLUCOPHAGE  Take 2 tablets (1,000 mg total) by mouth 2 (two) times daily. Patient states she takes 1000mg  twice a day per patient     multivitamin with minerals Tabs tablet  Take 1 tablet by mouth daily.     ondansetron 4 MG tablet  Commonly known as:  ZOFRAN  Take 1 tablet (4 mg total) by mouth every 6 (six) hours as needed for nausea.     oxyCODONE-acetaminophen 5-325 MG per tablet  Commonly known as:  PERCOCET/ROXICET  Take 1-2 tablets by mouth every 4 (four) hours as needed for severe pain.  Meds ordered this encounter  Medications  . amLODipine (NORVASC) 10 MG tablet    Sig: Take 1 tablet (10 mg total) by mouth daily.    Dispense:  30 tablet    Refill:  3  . gabapentin (NEURONTIN) 300 MG capsule    Sig: Take 1 capsule (300 mg total) by mouth at bedtime.    Dispense:  30 capsule    Refill:  3  . insulin aspart (NOVOLOG) 100 UNIT/ML injection    Sig: Inject 0-5 Units into the skin 3 (three) times daily with meals. CBG 70 - 120: 0 units CBG 121 - 150: 0 units CBG 151 - 200: 0 units CBG 201 - 250: 2 units CBG 251 - 300: 3 units CBG 301 - 350: 4 units CBG 351 - 400: 5 units    Dispense:  10 mL    Refill:  11    Please dispense flex pen. Patient can not use vials due to her hands shake to much  . insulin detemir (LEVEMIR) 100 UNIT/ML injection    Sig: Inject 0.3 mLs (30 Units total) into the skin 2 (two) times daily.    Dispense:  10 mL    Refill:  2    Please dispense flex pen. Patient can not use vials due to her  hands shake so much  . metFORMIN (GLUCOPHAGE) 500 MG tablet    Sig: Take 2 tablets (1,000 mg total) by mouth 2 (two) times daily. Patient states she takes 1000mg  twice a day per patient    Dispense:  120 tablet    Refill:  2  . losartan (COZAAR) 25 MG tablet    Sig: Take 1 tablet (25 mg total) by mouth daily.    Dispense:  30 tablet    Refill:  3    Immunization History  Administered Date(s) Administered  . Influenza Split 01/13/2013  . Influenza Whole 10/28/2008, 09/10/2009  . Influenza,inj,Quad PF,36+ Mos 12/17/2014  . PPD Test 02/02/2014  . Pneumococcal Polysaccharide-23 07/14/2008, 06/23/2014  . Td 10/21/2009    Family History  Problem Relation Age of Onset  . Diabetes Father   . Hypertension Father   . Cancer Maternal Grandmother   . Hypertension Maternal Grandfather   . Kidney disease Mother   . Hypertension Mother     History  Substance Use Topics  . Smoking status: Never Smoker   . Smokeless tobacco: Not on file  . Alcohol Use: No    Review of Systems   As noted in HPI  Filed Vitals:   01/06/15 1544  BP: 163/77  Pulse: 82  Temp: 98.3 F (36.8 C)  Resp: 16    Physical Exam  Physical Exam  Constitutional: No distress.  Eyes: EOM are normal. Pupils are equal, round, and reactive to light.  Cardiovascular: Normal rate and regular rhythm.   Pulmonary/Chest: Breath sounds normal. No respiratory distress. She has no wheezes. She has no rales.  Abdominal: There is no tenderness. There is no rebound.  Minimal suprapubic tenderness   Musculoskeletal: She exhibits no edema.    CBC    Component Value Date/Time   WBC 9.7 12/28/2014 0615   RBC 4.96 12/28/2014 0615   HGB 14.0 12/28/2014 0615   HCT 45.2 12/28/2014 0615   PLT 204 12/28/2014 0615   MCV 91.1 12/28/2014 0615   LYMPHSABS 0.6* 12/22/2014 1529   MONOABS 0.9 12/22/2014 1529   EOSABS 0.0 12/22/2014 1529   BASOSABS 0.0 12/22/2014 1529    CMP  Component Value Date/Time   NA 142  12/29/2014 0825   K 3.4* 12/29/2014 0825   CL 105 12/29/2014 0825   CO2 27 12/29/2014 0825   GLUCOSE 164* 12/29/2014 0825   BUN 11 12/29/2014 0825   CREATININE 0.64 12/29/2014 0825   CREATININE 0.67 12/01/2014 1314   CALCIUM 8.4 12/29/2014 0825   PROT 8.0 12/26/2014 1210   ALBUMIN 2.7* 12/26/2014 1210   AST 11 12/26/2014 1210   ALT 11 12/26/2014 1210   ALKPHOS 120* 12/26/2014 1210   BILITOT 1.1 12/26/2014 1210   GFRNONAA >90 12/29/2014 0825   GFRNONAA >89 12/01/2014 1314   GFRAA >90 12/29/2014 0825   GFRAA >89 12/01/2014 1314    Lab Results  Component Value Date/Time   CHOL 173 12/05/2013 11:33 AM    No components found for: HGA1C  Lab Results  Component Value Date/Time   AST 11 12/26/2014 12:10 PM    Assessment and Plan  Other specified diabetes mellitus without complications - Plan:  Results for orders placed or performed in visit on 01/06/15  Glucose (CBG)  Result Value Ref Range   POC Glucose 248 (A) 70 - 99 mg/dl  HgB A1c  Result Value Ref Range   Hemoglobin A1C 13.80    Diabetes is uncontrolled, though hemoglobin A1c shows slight improvement compared to last visit, she will continue with Levemir, also given prescription for NovoLog to do sliding scale with each meal, continue with metformin, diabetes meal planning, she'll come back in 2 weeks for nurse visit\  HgB A1c, gabapentin (NEURONTIN) 300 MG capsule, insulin aspart (NOVOLOG) 100 UNIT/ML injection, insulin detemir (LEVEMIR) 100 UNIT/ML injection, metFORMIN (GLUCOPHAGE) 500 MG tablet  Essential hypertension - Plan: Blood pressure is elevated, resume back on amLODipine (NORVASC) 10 MG tablet, losartan (COZAAR) 25 MG tablet, COMPLETE METABOLIC PANEL WITH GFR  UTI (lower urinary tract infection) Patient is going to complete and finish the course of antibiotic Keflex.  Hypokalemia - Plan:Will repeat blood chemistry  COMPLETE METABOLIC PANEL WITH GFR   Health Maintenance  -Vaccinations:  uptodate with  flu shot   Return in about 3 months (around 04/06/2015) for diabetes, hypertension, BP and CBG  check in 2 weeks/Nurse Visit.   This note has been created with Surveyor, quantity. Any transcriptional errors are unintentional.    Lorayne Marek, MD

## 2015-01-06 NOTE — Progress Notes (Signed)
Patient here for follow up from the hospital Was admitted for ketoacidosis Has not picked up her prescriptions for her additional blood pressure medications Requesting refills on her medications

## 2015-01-06 NOTE — Patient Instructions (Signed)
Diabetes Mellitus and Food It is important for you to manage your blood sugar (glucose) level. Your blood glucose level can be greatly affected by what you eat. Eating healthier foods in the appropriate amounts throughout the day at about the same time each day will help you control your blood glucose level. It can also help slow or prevent worsening of your diabetes mellitus. Healthy eating may even help you improve the level of your blood pressure and reach or maintain a healthy weight.  HOW CAN FOOD AFFECT ME? Carbohydrates Carbohydrates affect your blood glucose level more than any other type of food. Your dietitian will help you determine how many carbohydrates to eat at each meal and teach you how to count carbohydrates. Counting carbohydrates is important to keep your blood glucose at a healthy level, especially if you are using insulin or taking certain medicines for diabetes mellitus. Alcohol Alcohol can cause sudden decreases in blood glucose (hypoglycemia), especially if you use insulin or take certain medicines for diabetes mellitus. Hypoglycemia can be a life-threatening condition. Symptoms of hypoglycemia (sleepiness, dizziness, and disorientation) are similar to symptoms of having too much alcohol.  If your health care provider has given you approval to drink alcohol, do so in moderation and use the following guidelines:  Women should not have more than one drink per day, and men should not have more than two drinks per day. One drink is equal to:  12 oz of beer.  5 oz of wine.  1 oz of hard liquor.  Do not drink on an empty stomach.  Keep yourself hydrated. Have water, diet soda, or unsweetened iced tea.  Regular soda, juice, and other mixers might contain a lot of carbohydrates and should be counted. WHAT FOODS ARE NOT RECOMMENDED? As you make food choices, it is important to remember that all foods are not the same. Some foods have fewer nutrients per serving than other  foods, even though they might have the same number of calories or carbohydrates. It is difficult to get your body what it needs when you eat foods with fewer nutrients. Examples of foods that you should avoid that are high in calories and carbohydrates but low in nutrients include:  Trans fats (most processed foods list trans fats on the Nutrition Facts label).  Regular soda.  Juice.  Candy.  Sweets, such as cake, pie, doughnuts, and cookies.  Fried foods. WHAT FOODS CAN I EAT? Have nutrient-rich foods, which will nourish your body and keep you healthy. The food you should eat also will depend on several factors, including:  The calories you need.  The medicines you take.  Your weight.  Your blood glucose level.  Your blood pressure level.  Your cholesterol level. You also should eat a variety of foods, including:  Protein, such as meat, poultry, fish, tofu, nuts, and seeds (lean animal proteins are best).  Fruits.  Vegetables.  Dairy products, such as milk, cheese, and yogurt (low fat is best).  Breads, grains, pasta, cereal, rice, and beans.  Fats such as olive oil, trans fat-free margarine, canola oil, avocado, and olives. DOES EVERYONE WITH DIABETES MELLITUS HAVE THE SAME MEAL PLAN? Because every person with diabetes mellitus is different, there is not one meal plan that works for everyone. It is very important that you meet with a dietitian who will help you create a meal plan that is just right for you. Document Released: 08/03/2005 Document Revised: 11/11/2013 Document Reviewed: 10/03/2013 [employer] Patient Information 2015 [employer]. This   information is not intended to replace advice given to you by your health care provider. Make sure you discuss any questions you have with your health care provider. DASH Eating Plan DASH stands for "Dietary Approaches to Stop Hypertension." The DASH eating plan is a healthy eating plan that has been shown to reduce high  blood pressure (hypertension). Additional health benefits may include reducing the risk of type 2 diabetes mellitus, heart disease, and stroke. The DASH eating plan may also help with weight loss. WHAT DO I NEED TO KNOW ABOUT THE DASH EATING PLAN? For the DASH eating plan, you will follow these general guidelines:  Choose foods with a percent daily value for sodium of less than 5% (as listed on the food label).  Use salt-free seasonings or herbs instead of table salt or sea salt.  Check with your health care provider or pharmacist before using salt substitutes.  Eat lower-sodium products, often labeled as "lower sodium" or "no salt added."  Eat fresh foods.  Eat more vegetables, fruits, and low-fat dairy products.  Choose whole grains. Look for the word "whole" as the first word in the ingredient list.  Choose fish and skinless chicken or turkey more often than red meat. Limit fish, poultry, and meat to 6 oz (170 g) each day.  Limit sweets, desserts, sugars, and sugary drinks.  Choose heart-healthy fats.  Limit cheese to 1 oz (28 g) per day.  Eat more home-cooked food and less restaurant, buffet, and fast food.  Limit fried foods.  Cook foods using methods other than frying.  Limit canned vegetables. If you do use them, rinse them well to decrease the sodium.  When eating at a restaurant, ask that your food be prepared with less salt, or no salt if possible. WHAT FOODS CAN I EAT? Seek help from a dietitian for individual calorie needs. Grains Whole grain or whole wheat bread. Brown rice. Whole grain or whole wheat pasta. Quinoa, bulgur, and whole grain cereals. Low-sodium cereals. Corn or whole wheat flour tortillas. Whole grain cornbread. Whole grain crackers. Low-sodium crackers. Vegetables Fresh or frozen vegetables (raw, steamed, roasted, or grilled). Low-sodium or reduced-sodium tomato and vegetable juices. Low-sodium or reduced-sodium tomato sauce and paste. Low-sodium  or reduced-sodium canned vegetables.  Fruits All fresh, canned (in natural juice), or frozen fruits. Meat and Other Protein Products Ground beef (85% or leaner), grass-fed beef, or beef trimmed of fat. Skinless chicken or turkey. Ground chicken or turkey. Pork trimmed of fat. All fish and seafood. Eggs. Dried beans, peas, or lentils. Unsalted nuts and seeds. Unsalted canned beans. Dairy Low-fat dairy products, such as skim or 1% milk, 2% or reduced-fat cheeses, low-fat ricotta or cottage cheese, or plain low-fat yogurt. Low-sodium or reduced-sodium cheeses. Fats and Oils Tub margarines without trans fats. Light or reduced-fat mayonnaise and salad dressings (reduced sodium). Avocado. Safflower, olive, or canola oils. Natural peanut or almond butter. Other Unsalted popcorn and pretzels. The items listed above may not be a complete list of recommended foods or beverages. Contact your dietitian for more options. WHAT FOODS ARE NOT RECOMMENDED? Grains White bread. White pasta. White rice. Refined cornbread. Bagels and croissants. Crackers that contain trans fat. Vegetables Creamed or fried vegetables. Vegetables in a cheese sauce. Regular canned vegetables. Regular canned tomato sauce and paste. Regular tomato and vegetable juices. Fruits Dried fruits. Canned fruit in light or heavy syrup. Fruit juice. Meat and Other Protein Products Fatty cuts of meat. Ribs, chicken wings, bacon, sausage, bologna, salami, chitterlings, fatback, hot   dogs, bratwurst, and packaged luncheon meats. Salted nuts and seeds. Canned beans with salt. Dairy Whole or 2% milk, cream, half-and-half, and cream cheese. Whole-fat or sweetened yogurt. Full-fat cheeses or blue cheese. Nondairy creamers and whipped toppings. Processed cheese, cheese spreads, or cheese curds. Condiments Onion and garlic salt, seasoned salt, table salt, and sea salt. Canned and packaged gravies. Worcestershire sauce. Tartar sauce. Barbecue sauce.  Teriyaki sauce. Soy sauce, including reduced sodium. Steak sauce. Fish sauce. Oyster sauce. Cocktail sauce. Horseradish. Ketchup and mustard. Meat flavorings and tenderizers. Bouillon cubes. Hot sauce. Tabasco sauce. Marinades. Taco seasonings. Relishes. Fats and Oils Butter, stick margarine, lard, shortening, ghee, and bacon fat. Coconut, palm kernel, or palm oils. Regular salad dressings. Other Pickles and olives. Salted popcorn and pretzels. The items listed above may not be a complete list of foods and beverages to avoid. Contact your dietitian for more information. WHERE CAN I FIND MORE INFORMATION? National [employer], [employer], [employer]: www.nhlbi.nih.gov/health/health-topics/topics/dash/ Document Released: 10/26/2011 Document Revised: 03/23/2014 Document Reviewed: 09/10/2013 [employer] Patient Information 2015 [employer]. This information is not intended to replace advice given to you by your health care provider. Make sure you discuss any questions you have with your health care provider.  

## 2015-01-07 LAB — COMPLETE METABOLIC PANEL WITH GFR
ALT: 21 U/L (ref 0–35)
AST: 14 U/L (ref 0–37)
Albumin: 3.1 g/dL — ABNORMAL LOW (ref 3.5–5.2)
Alkaline Phosphatase: 110 U/L (ref 39–117)
BUN: 7 mg/dL (ref 6–23)
CALCIUM: 8.8 mg/dL (ref 8.4–10.5)
CO2: 33 meq/L — AB (ref 19–32)
CREATININE: 0.65 mg/dL (ref 0.50–1.10)
Chloride: 97 mEq/L (ref 96–112)
GFR, Est Non African American: >89 mL/min
Glucose, Bld: 244 mg/dL — ABNORMAL HIGH (ref 70–99)
Potassium: 4.1 mEq/L (ref 3.5–5.3)
Sodium: 138 mEq/L (ref 135–145)
Total Bilirubin: 0.3 mg/dL (ref 0.2–1.2)
Total Protein: 6.4 g/dL (ref 6.0–8.3)

## 2015-01-22 ENCOUNTER — Ambulatory Visit: Payer: Self-pay | Attending: Internal Medicine | Admitting: *Deleted

## 2015-01-22 VITALS — BP 165/76 | HR 82 | Temp 98.4°F | Resp 20

## 2015-01-22 DIAGNOSIS — E1165 Type 2 diabetes mellitus with hyperglycemia: Secondary | ICD-10-CM | POA: Insufficient documentation

## 2015-01-22 DIAGNOSIS — Z794 Long term (current) use of insulin: Secondary | ICD-10-CM | POA: Insufficient documentation

## 2015-01-22 LAB — GLUCOSE, POCT (MANUAL RESULT ENTRY)
POC GLUCOSE: 265 mg/dL — AB (ref 70–99)
POC Glucose: 341 mg/dl — AB (ref 70–99)

## 2015-01-22 MED ORDER — INSULIN ASPART 100 UNIT/ML ~~LOC~~ SOLN
10.0000 [IU] | Freq: Once | SUBCUTANEOUS | Status: AC
  Administered 2015-01-22: 10 [IU] via SUBCUTANEOUS

## 2015-01-22 NOTE — Patient Instructions (Signed)
DASH Eating Plan DASH stands for "Dietary Approaches to Stop Hypertension." The DASH eating plan is a healthy eating plan that has been shown to reduce high blood pressure (hypertension). Additional health benefits may include reducing the risk of type 2 diabetes mellitus, heart disease, and stroke. The DASH eating plan may also help with weight loss. WHAT DO I NEED TO KNOW ABOUT THE DASH EATING PLAN? For the DASH eating plan, you will follow these general guidelines:  Choose foods with a percent daily value for sodium of less than 5% (as listed on the food label).  Use salt-free seasonings or herbs instead of table salt or sea salt.  Check with your health care provider or pharmacist before using salt substitutes.  Eat lower-sodium products, often labeled as "lower sodium" or "no salt added."  Eat fresh foods.  Eat more vegetables, fruits, and low-fat dairy products.  Choose whole grains. Look for the word "whole" as the first word in the ingredient list.  Choose fish and skinless chicken or turkey more often than red meat. Limit fish, poultry, and meat to 6 oz (170 g) each day.  Limit sweets, desserts, sugars, and sugary drinks.  Choose heart-healthy fats.  Limit cheese to 1 oz (28 g) per day.  Eat more home-cooked food and less restaurant, buffet, and fast food.  Limit fried foods.  Cook foods using methods other than frying.  Limit canned vegetables. If you do use them, rinse them well to decrease the sodium.  When eating at a restaurant, ask that your food be prepared with less salt, or no salt if possible. WHAT FOODS CAN I EAT? Seek help from a dietitian for individual calorie needs. Grains Whole grain or whole wheat bread. Brown rice. Whole grain or whole wheat pasta. Quinoa, bulgur, and whole grain cereals. Low-sodium cereals. Corn or whole wheat flour tortillas. Whole grain cornbread. Whole grain crackers. Low-sodium crackers. Vegetables Fresh or frozen vegetables  (raw, steamed, roasted, or grilled). Low-sodium or reduced-sodium tomato and vegetable juices. Low-sodium or reduced-sodium tomato sauce and paste. Low-sodium or reduced-sodium canned vegetables.  Fruits All fresh, canned (in natural juice), or frozen fruits. Meat and Other Protein Products Ground beef (85% or leaner), grass-fed beef, or beef trimmed of fat. Skinless chicken or turkey. Ground chicken or turkey. Pork trimmed of fat. All fish and seafood. Eggs. Dried beans, peas, or lentils. Unsalted nuts and seeds. Unsalted canned beans. Dairy Low-fat dairy products, such as skim or 1% milk, 2% or reduced-fat cheeses, low-fat ricotta or cottage cheese, or plain low-fat yogurt. Low-sodium or reduced-sodium cheeses. Fats and Oils Tub margarines without trans fats. Light or reduced-fat mayonnaise and salad dressings (reduced sodium). Avocado. Safflower, olive, or canola oils. Natural peanut or almond butter. Other Unsalted popcorn and pretzels. The items listed above may not be a complete list of recommended foods or beverages. Contact your dietitian for more options. WHAT FOODS ARE NOT RECOMMENDED? Grains White bread. White pasta. White rice. Refined cornbread. Bagels and croissants. Crackers that contain trans fat. Vegetables Creamed or fried vegetables. Vegetables in a cheese sauce. Regular canned vegetables. Regular canned tomato sauce and paste. Regular tomato and vegetable juices. Fruits Dried fruits. Canned fruit in light or heavy syrup. Fruit juice. Meat and Other Protein Products Fatty cuts of meat. Ribs, chicken wings, bacon, sausage, bologna, salami, chitterlings, fatback, hot dogs, bratwurst, and packaged luncheon meats. Salted nuts and seeds. Canned beans with salt. Dairy Whole or 2% milk, cream, half-and-half, and cream cheese. Whole-fat or sweetened yogurt. Full-fat   cheeses or blue cheese. Nondairy creamers and whipped toppings. Processed cheese, cheese spreads, or cheese  curds. Condiments Onion and garlic salt, seasoned salt, table salt, and sea salt. Canned and packaged gravies. Worcestershire sauce. Tartar sauce. Barbecue sauce. Teriyaki sauce. Soy sauce, including reduced sodium. Steak sauce. Fish sauce. Oyster sauce. Cocktail sauce. Horseradish. Ketchup and mustard. Meat flavorings and tenderizers. Bouillon cubes. Hot sauce. Tabasco sauce. Marinades. Taco seasonings. Relishes. Fats and Oils Butter, stick margarine, lard, shortening, ghee, and bacon fat. Coconut, palm kernel, or palm oils. Regular salad dressings. Other Pickles and olives. Salted popcorn and pretzels. The items listed above may not be a complete list of foods and beverages to avoid. Contact your dietitian for more information. WHERE CAN I FIND MORE INFORMATION? National Heart, [employer], [employer] Institute: travelstabloid.com Document Released: 10/26/2011 Document Revised: 03/23/2014 Document Reviewed: 09/10/2013 [facility] Patient Information 2015 [place], [place]. This information is not intended to replace advice given to you by your health care provider. Make sure you discuss any questions you have with your health care provider. Diabetes and Exercise Exercising regularly is important. It is not just about losing weight. It has many health benefits, such as:  Improving your overall fitness, flexibility, and endurance.  Increasing your bone density.  Helping with weight control.  Decreasing your body fat.  Increasing your muscle strength.  Reducing stress and tension.  Improving your overall health. People with diabetes who exercise gain additional benefits because exercise:  Reduces appetite.  Improves the body's use of blood sugar (glucose).  Helps lower or control blood glucose.  Decreases blood pressure.  Helps control blood lipids (such as cholesterol and triglycerides).  Improves the body's use of the hormone insulin by:  Increasing the  body's insulin sensitivity.  Reducing the body's insulin needs.  Decreases the risk for heart disease because exercising:  Lowers cholesterol and triglycerides levels.  Increases the levels of good cholesterol (such as high-density lipoproteins [HDL]) in the body.  Lowers blood glucose levels. YOUR ACTIVITY PLAN  Choose an activity that you enjoy and set realistic goals. Your health care provider or diabetes educator can help you make an activity plan that works for you. Exercise regularly as directed by your health care provider. This includes:  Performing resistance training twice a week such as push-ups, sit-ups, lifting weights, or using resistance bands.  Performing 150 minutes of cardio exercises each week such as walking, running, or playing sports.  Staying active and spending no more than 90 minutes at one time being inactive. Even short bursts of exercise are good for you. Three 10-minute sessions spread throughout the day are just as beneficial as a single 30-minute session. Some exercise ideas include:  Taking the dog for a walk.  Taking the stairs instead of the elevator.  Dancing to your favorite song.  Doing an exercise video.  Doing your favorite exercise with a friend. RECOMMENDATIONS FOR EXERCISING WITH TYPE 1 OR TYPE 2 DIABETES   Check your blood glucose before exercising. If blood glucose levels are greater than 240 mg/dL, check for urine ketones. Do not exercise if ketones are present.  Avoid injecting insulin into areas of the body that are going to be exercised. For example, avoid injecting insulin into:  The arms when playing tennis.  The legs when jogging.  Keep a record of:  Food intake before and after you exercise.  Expected peak times of insulin action.  Blood glucose levels before and after you exercise.  The type and amount of exercise  you have done.  Review your records with your health care provider. Your health care provider will  help you to develop guidelines for adjusting food intake and insulin amounts before and after exercising.  If you take insulin or oral hypoglycemic agents, watch for signs and symptoms of hypoglycemia. They include:  Dizziness.  Shaking.  Sweating.  Chills.  Confusion.  Drink plenty of water while you exercise to prevent dehydration or heat stroke. Body water is lost during exercise and must be replaced.  Talk to your health care provider before starting an exercise program to make sure it is safe for you. Remember, almost any type of activity is better than none. Document Released: 01/27/2004 Document Revised: 03/23/2014 Document Reviewed: 04/15/2013 [employer] Patient Information 2015 [employer]. This information is not intended to replace advice given to you by your health care provider. Make sure you discuss any questions you have with your health care provider. Diabetes Mellitus and Food It is important for you to manage your blood sugar (glucose) level. Your blood glucose level can be greatly affected by what you eat. Eating healthier foods in the appropriate amounts throughout the day at about the same time each day will help you control your blood glucose level. It can also help slow or prevent worsening of your diabetes mellitus. Healthy eating may even help you improve the level of your blood pressure and reach or maintain a healthy weight.  HOW CAN FOOD AFFECT ME? Carbohydrates Carbohydrates affect your blood glucose level more than any other type of food. Your dietitian will help you determine how many carbohydrates to eat at each meal and teach you how to count carbohydrates. Counting carbohydrates is important to keep your blood glucose at a healthy level, especially if you are using insulin or taking certain medicines for diabetes mellitus. Alcohol Alcohol can cause sudden decreases in blood glucose (hypoglycemia), especially if you use insulin or take certain medicines for  diabetes mellitus. Hypoglycemia can be a life-threatening condition. Symptoms of hypoglycemia (sleepiness, dizziness, and disorientation) are similar to symptoms of having too much alcohol.  If your health care provider has given you approval to drink alcohol, do so in moderation and use the following guidelines:  Women should not have more than one drink per day, and men should not have more than two drinks per day. One drink is equal to:  12 oz of beer.  5 oz of wine.  1 oz of hard liquor.  Do not drink on an empty stomach.  Keep yourself hydrated. Have water, diet soda, or unsweetened iced tea.  Regular soda, juice, and other mixers might contain a lot of carbohydrates and should be counted. WHAT FOODS ARE NOT RECOMMENDED? As you make food choices, it is important to remember that all foods are not the same. Some foods have fewer nutrients per serving than other foods, even though they might have the same number of calories or carbohydrates. It is difficult to get your body what it needs when you eat foods with fewer nutrients. Examples of foods that you should avoid that are high in calories and carbohydrates but low in nutrients include:  Trans fats (most processed foods list trans fats on the Nutrition Facts label).  Regular soda.  Juice.  Candy.  Sweets, such as cake, pie, doughnuts, and cookies.  Fried foods. WHAT FOODS CAN I EAT? Have nutrient-rich foods, which will nourish your body and keep you healthy. The food you should eat also will depend on several factors,   including:  The calories you need.  The medicines you take.  Your weight.  Your blood glucose level.  Your blood pressure level.  Your cholesterol level. You also should eat a variety of foods, including:  Protein, such as meat, poultry, fish, tofu, nuts, and seeds (lean animal proteins are best).  Fruits.  Vegetables.  Dairy products, such as milk, cheese, and yogurt (low fat is  best).  Breads, grains, pasta, cereal, rice, and beans.  Fats such as olive oil, trans fat-free margarine, canola oil, avocado, and olives. DOES EVERYONE WITH DIABETES MELLITUS HAVE THE SAME MEAL PLAN? Because every person with diabetes mellitus is different, there is not one meal plan that works for everyone. It is very important that you meet with a dietitian who will help you create a meal plan that is just right for you. Document Released: 08/03/2005 Document Revised: 11/11/2013 Document Reviewed: 10/03/2013 [facility] Patient Information 2015 [place], [place]. This information is not intended to replace advice given to you by your health care provider. Make sure you discuss any questions you have with your health care provider. Basic Carbohydrate Counting for Diabetes Mellitus Carbohydrate counting is a method for keeping track of the amount of carbohydrates you eat. Eating carbohydrates naturally increases the level of sugar (glucose) in your blood, so it is important for you to know the amount that is okay for you to have in every meal. Carbohydrate counting helps keep the level of glucose in your blood within normal limits. The amount of carbohydrates allowed is different for every person. A dietitian can help you calculate the amount that is right for you. Once you know the amount of carbohydrates you can have, you can count the carbohydrates in the foods you want to eat. Carbohydrates are found in the following foods:  Grains, such as breads and cereals.  Dried beans and soy products.  Starchy vegetables, such as potatoes, peas, and corn.  Fruit and fruit juices.  Milk and yogurt.  Sweets and snack foods, such as cake, cookies, candy, chips, soft drinks, and fruit drinks. CARBOHYDRATE COUNTING There are two ways to count the carbohydrates in your food. You can use either of the methods or a combination of both. Reading the "Nutrition Facts" on [place] The "Nutrition Facts" is  an area that is included on the labels of almost all packaged food and beverages in the [place]. It includes the serving size of that food or beverage and information about the nutrients in each serving of the food, including the grams (g) of carbohydrate per serving.  Decide the number of servings of this food or beverage that you will be able to eat or drink. Multiply that number of servings by the number of grams of carbohydrate that is listed on the label for that serving. The total will be the amount of carbohydrates you will be having when you eat or drink this food or beverage. Learning Standard Serving Sizes of Food When you eat food that is not packaged or does not include "Nutrition Facts" on the label, you need to measure the servings in order to count the amount of carbohydrates.A serving of most carbohydrate-rich foods contains about 15 g of carbohydrates. The following list includes serving sizes of carbohydrate-rich foods that provide 15 g ofcarbohydrate per serving:   1 slice of bread (1 oz) or 1 six-inch tortilla.    of a hamburger bun or English muffin.  4-6 crackers.   cup unsweetened dry cereal.  cup hot cereal.   cup rice or pasta.    cup mashed potatoes or  of a large baked potato.  1 cup fresh fruit or one small piece of fruit.    cup canned or frozen fruit or fruit juice.  1 cup milk.   cup plain fat-free yogurt or yogurt sweetened with artificial sweeteners.   cup cooked dried beans or starchy vegetable, such as peas, corn, or potatoes.  Decide the number of standard-size servings that you will eat. Multiply that number of servings by 15 (the grams of carbohydrates in that serving). For example, if you eat 2 cups of strawberries, you will have eaten 2 servings and 30 g of carbohydrates (2 servings x 15 g = 30 g). For foods such as soups and casseroles, in which more than one food is mixed in, you will need to count the carbohydrates in each  food that is included. EXAMPLE OF CARBOHYDRATE COUNTING Sample Dinner  3 oz chicken breast.   cup of brown rice.   cup of corn.  1 cup milk.   1 cup strawberries with sugar-free whipped topping.  Carbohydrate Calculation Step 1: Identify the foods that contain carbohydrates:   Rice.   Corn.   Milk.   Strawberries. Step 2:Calculate the number of servings eaten of each:   2 servings of rice.   1 serving of corn.   1 serving of milk.   1 serving of strawberries. Step 3: Multiply each of those number of servings by 15 g:   2 servings of rice x 15 g = 30 g.   1 serving of corn x 15 g = 15 g.   1 serving of milk x 15 g = 15 g.   1 serving of strawberries x 15 g = 15 g. Step 4: Add together all of the amounts to find the total grams of carbohydrates eaten: 30 g + 15 g + 15 g + 15 g = 75 g. Document Released: 11/06/2005 Document Revised: 03/23/2014 Document Reviewed: 10/03/2013 [facility] Patient Information 2015 [place], [place]. This information is not intended to replace advice given to you by your health care provider. Make sure you discuss any questions you have with your health care provider.

## 2015-01-22 NOTE — Progress Notes (Signed)
Patient presents for BP check, CBG and record review, however, patient did not bring log or meter Med list reviewed; patient reports taking levemir differently than directed (see below) Patient reports AM fasting blood sugars ranging 159-259 Patient reports before lunch blood sugars ranging 216-329 Patient reports before dinner blood sugars ranging 279-359 Patient reports before bed blood sugars ranging 179-279 States usually taking 30 units levemir twice daily except when before dinner BS > 200 then she takes 40 units levemir. One time 2 weeks ago gave 60 units levemir when BS was 359  Patient educated on long acting insulin and not to treat as sliding scale States she's getting up and walking around house every hour; walking about an hour/day  Patient states today's AM fasting BS at 0800 was 259. Took 7 units novolog and 40 units levemir at that time  CBG 341 3 hours after administering insulins as above and eating egg mcmuffin, hashbrown and cup of coffee with 3 french vanilla creamers and tsp of sugar  BP 165/76 P 82 R 20 T  98.4 oral SpO2 95%  Per Covering Provider: Treat today's blood sugar per protocol Have patient return in 1-2 weeks with meter and log Discuss switching to toujeo insulin with PCP  10 units novolog insulin given SQ per protocol and covering provider  Patient advised to call for med refills at least 7 days before running out so as not to go without.  Patient given literature on DASH Eating Plan Diabetes and Food, Diabetes and Exercise, and Basic Carb Counting  Referral to Diabetic Nutrition and Education placed  CBG 265 30 minutes after insulin admin. Patient discharged to home in stable condition.

## 2015-02-26 ENCOUNTER — Ambulatory Visit: Payer: Self-pay | Attending: Internal Medicine | Admitting: *Deleted

## 2015-02-26 VITALS — BP 167/70 | HR 72 | Temp 98.3°F | Resp 20 | Wt 203.2 lb

## 2015-02-26 DIAGNOSIS — Z794 Long term (current) use of insulin: Secondary | ICD-10-CM | POA: Insufficient documentation

## 2015-02-26 DIAGNOSIS — E119 Type 2 diabetes mellitus without complications: Secondary | ICD-10-CM | POA: Insufficient documentation

## 2015-02-26 DIAGNOSIS — E1165 Type 2 diabetes mellitus with hyperglycemia: Secondary | ICD-10-CM

## 2015-02-26 LAB — GLUCOSE, POCT (MANUAL RESULT ENTRY): POC GLUCOSE: 196 mg/dL — AB (ref 70–99)

## 2015-02-26 NOTE — Progress Notes (Signed)
Patient presents for CBG and record review for T2DM Med list reviewed; patient reports taking all meds as directed, except taking 40 units levemir once daily in evening Patient's AM fasting blood sugars ranging 140-320 Patient's before lunch blood sugars ranging 200-325 Patient's before dinner blood sugars ranging 140-364 Discussed need for low sodium diet and using Mrs. Dash as alternative to salt Encouraged to choose foods with 5% or less of daily value for sodium. Discrepancy noted today between patient 's glucometer and office glucometer. Patient to get new battery for glucometer  CBG 196  2.5 hours after meal BP 167/70 P 72 R 20 T  98.3 oral SpO2 98%  Patient to take levemir 30 units bid as previously directed and return in 2 weeks for nurse visit for BP check and CBG and record review  Patient given literature on The plate method

## 2015-03-18 ENCOUNTER — Ambulatory Visit: Payer: Self-pay | Attending: Internal Medicine | Admitting: *Deleted

## 2015-03-18 VITALS — BP 170/92 | HR 66 | Temp 98.1°F | Resp 169

## 2015-03-18 DIAGNOSIS — Z794 Long term (current) use of insulin: Secondary | ICD-10-CM | POA: Insufficient documentation

## 2015-03-18 DIAGNOSIS — E1165 Type 2 diabetes mellitus with hyperglycemia: Secondary | ICD-10-CM

## 2015-03-18 DIAGNOSIS — E139 Other specified diabetes mellitus without complications: Secondary | ICD-10-CM

## 2015-03-18 LAB — GLUCOSE, POCT (MANUAL RESULT ENTRY): POC Glucose: 202 mg/dl — AB (ref 70–99)

## 2015-03-18 MED ORDER — INSULIN DETEMIR 100 UNIT/ML ~~LOC~~ SOLN: 33.0000 [IU] | Freq: Two times a day (BID) | SUBCUTANEOUS | Status: DC

## 2015-03-18 NOTE — Progress Notes (Signed)
Patient presents for CBG and record review for T2DM Med list reviewed; patient reports taking all meds as directed except amlodipine. States did not know about this med Patient's AM fasting blood sugars ranging 140-296 Patient's before lunch blood sugars ranging 126-210 Patient's before dinner blood sugars ranging 119-310 Walking 30 minutes per day for exercise Denies increased thirst and urination  CBG 202 AM fasting  Lab Results  Component Value Date   HGBA1C 13.80 01/06/2015   Filed Vitals:   03/18/15 1123  BP: 170/92  Pulse: 66  Temp: 98.1 F (36.7 C)  Resp: 169    Per PCP: Increase levemir to 33 units bid  Patient will restart amlodipine today  Patient aware that she is to f/u with PCP 3 months from last visit. Due 04/06/2015  Patient advised to call for med refills at least 7 days before running out so as not to go without.

## 2015-04-08 ENCOUNTER — Ambulatory Visit: Payer: Self-pay | Attending: Internal Medicine | Admitting: Internal Medicine

## 2015-04-08 ENCOUNTER — Encounter: Payer: Self-pay | Admitting: Internal Medicine

## 2015-04-08 VITALS — BP 169/68 | HR 80 | Temp 98.0°F | Resp 16 | Wt 205.0 lb

## 2015-04-08 DIAGNOSIS — E1165 Type 2 diabetes mellitus with hyperglycemia: Secondary | ICD-10-CM | POA: Insufficient documentation

## 2015-04-08 DIAGNOSIS — Z794 Long term (current) use of insulin: Secondary | ICD-10-CM | POA: Insufficient documentation

## 2015-04-08 DIAGNOSIS — E089 Diabetes mellitus due to underlying condition without complications: Secondary | ICD-10-CM

## 2015-04-08 DIAGNOSIS — I10 Essential (primary) hypertension: Secondary | ICD-10-CM

## 2015-04-08 LAB — GLUCOSE, POCT (MANUAL RESULT ENTRY): POC Glucose: 330 mg/dl — AB (ref 70–99)

## 2015-04-08 LAB — POCT GLYCOSYLATED HEMOGLOBIN (HGB A1C): HEMOGLOBIN A1C: 13.4

## 2015-04-08 MED ORDER — "INSULIN SYRINGE-NEEDLE U-100 28G X 1/2"" 0.3 ML MISC": Status: DC

## 2015-04-08 MED ORDER — AMLODIPINE BESYLATE 10 MG PO TABS: 10.0000 mg | ORAL_TABLET | Freq: Every day | ORAL | Status: DC

## 2015-04-08 MED ORDER — INSULIN ASPART 100 UNIT/ML ~~LOC~~ SOLN: 0.0000 [IU] | Freq: Three times a day (TID) | SUBCUTANEOUS | Status: DC

## 2015-04-08 MED ORDER — METFORMIN HCL 500 MG PO TABS: 1000.0000 mg | ORAL_TABLET | Freq: Two times a day (BID) | ORAL | Status: DC

## 2015-04-08 MED ORDER — LOSARTAN POTASSIUM 50 MG PO TABS: 50.0000 mg | ORAL_TABLET | Freq: Every day | ORAL | Status: DC

## 2015-04-08 MED ORDER — GABAPENTIN 300 MG PO CAPS: 300.0000 mg | ORAL_CAPSULE | Freq: Two times a day (BID) | ORAL | Status: DC

## 2015-04-08 MED ORDER — INSULIN DETEMIR 100 UNIT/ML ~~LOC~~ SOLN: 40.0000 [IU] | Freq: Two times a day (BID) | SUBCUTANEOUS | Status: DC

## 2015-04-08 NOTE — Patient Instructions (Signed)
DASH Eating Plan DASH stands for "Dietary Approaches to Stop Hypertension." The DASH eating plan is a healthy eating plan that has been shown to reduce high blood pressure (hypertension). Additional health benefits may include reducing the risk of type 2 diabetes mellitus, heart disease, and stroke. The DASH eating plan may also help with weight loss. WHAT DO I NEED TO KNOW ABOUT THE DASH EATING PLAN? For the DASH eating plan, you will follow these general guidelines:  Choose foods with a percent daily value for sodium of less than 5% (as listed on the food label).  Use salt-free seasonings or herbs instead of table salt or sea salt.  Check with your health care provider or pharmacist before using salt substitutes.  Eat lower-sodium products, often labeled as "lower sodium" or "no salt added."  Eat fresh foods.  Eat more vegetables, fruits, and low-fat dairy products.  Choose whole grains. Look for the word "whole" as the first word in the ingredient list.  Choose fish and skinless chicken or turkey more often than red meat. Limit fish, poultry, and meat to 6 oz (170 g) each day.  Limit sweets, desserts, sugars, and sugary drinks.  Choose heart-healthy fats.  Limit cheese to 1 oz (28 g) per day.  Eat more home-cooked food and less restaurant, buffet, and fast food.  Limit fried foods.  Cook foods using methods other than frying.  Limit canned vegetables. If you do use them, rinse them well to decrease the sodium.  When eating at a restaurant, ask that your food be prepared with less salt, or no salt if possible. WHAT FOODS CAN I EAT? Seek help from a dietitian for individual calorie needs. Grains Whole grain or whole wheat bread. Brown rice. Whole grain or whole wheat pasta. Quinoa, bulgur, and whole grain cereals. Low-sodium cereals. Corn or whole wheat flour tortillas. Whole grain cornbread. Whole grain crackers. Low-sodium crackers. Vegetables Fresh or frozen vegetables  (raw, steamed, roasted, or grilled). Low-sodium or reduced-sodium tomato and vegetable juices. Low-sodium or reduced-sodium tomato sauce and paste. Low-sodium or reduced-sodium canned vegetables.  Fruits All fresh, canned (in natural juice), or frozen fruits. Meat and Other Protein Products Ground beef (85% or leaner), grass-fed beef, or beef trimmed of fat. Skinless chicken or turkey. Ground chicken or turkey. Pork trimmed of fat. All fish and seafood. Eggs. Dried beans, peas, or lentils. Unsalted nuts and seeds. Unsalted canned beans. Dairy Low-fat dairy products, such as skim or 1% milk, 2% or reduced-fat cheeses, low-fat ricotta or cottage cheese, or plain low-fat yogurt. Low-sodium or reduced-sodium cheeses. Fats and Oils Tub margarines without trans fats. Light or reduced-fat mayonnaise and salad dressings (reduced sodium). Avocado. Safflower, olive, or canola oils. Natural peanut or almond butter. Other Unsalted popcorn and pretzels. The items listed above may not be a complete list of recommended foods or beverages. Contact your dietitian for more options. WHAT FOODS ARE NOT RECOMMENDED? Grains White bread. White pasta. White rice. Refined cornbread. Bagels and croissants. Crackers that contain trans fat. Vegetables Creamed or fried vegetables. Vegetables in a cheese sauce. Regular canned vegetables. Regular canned tomato sauce and paste. Regular tomato and vegetable juices. Fruits Dried fruits. Canned fruit in light or heavy syrup. Fruit juice. Meat and Other Protein Products Fatty cuts of meat. Ribs, chicken wings, bacon, sausage, bologna, salami, chitterlings, fatback, hot dogs, bratwurst, and packaged luncheon meats. Salted nuts and seeds. Canned beans with salt. Dairy Whole or 2% milk, cream, half-and-half, and cream cheese. Whole-fat or sweetened yogurt. Full-fat   cheeses or blue cheese. Nondairy creamers and whipped toppings. Processed cheese, cheese spreads, or cheese  curds. Condiments Onion and garlic salt, seasoned salt, table salt, and sea salt. Canned and packaged gravies. Worcestershire sauce. Tartar sauce. Barbecue sauce. Teriyaki sauce. Soy sauce, including reduced sodium. Steak sauce. Fish sauce. Oyster sauce. Cocktail sauce. Horseradish. Ketchup and mustard. Meat flavorings and tenderizers. Bouillon cubes. Hot sauce. Tabasco sauce. Marinades. Taco seasonings. Relishes. Fats and Oils Butter, stick margarine, lard, shortening, ghee, and bacon fat. Coconut, palm kernel, or palm oils. Regular salad dressings. Other Pickles and olives. Salted popcorn and pretzels. The items listed above may not be a complete list of foods and beverages to avoid. Contact your dietitian for more information. WHERE CAN I FIND MORE INFORMATION? National Heart, Lung, and [employer] Institute: www.nhlbi.nih.gov/health/health-topics/topics/dash/ Document Released: 10/26/2011 Document Revised: 03/23/2014 Document Reviewed: 09/10/2013 ExitCare Patient Information 2015 [employer]. This information is not intended to replace advice given to you by your health care provider. Make sure you discuss any questions you have with your health care provider. Diabetes Mellitus and Food It is important for you to manage your blood sugar (glucose) level. Your blood glucose level can be greatly affected by what you eat. Eating healthier foods in the appropriate amounts throughout the day at about the same time each day will help you control your blood glucose level. It can also help slow or prevent worsening of your diabetes mellitus. Healthy eating may even help you improve the level of your blood pressure and reach or maintain a healthy weight.  HOW CAN FOOD AFFECT ME? Carbohydrates Carbohydrates affect your blood glucose level more than any other type of food. Your dietitian will help you determine how many carbohydrates to eat at each meal and teach you how to count carbohydrates. Counting  carbohydrates is important to keep your blood glucose at a healthy level, especially if you are using insulin or taking certain medicines for diabetes mellitus. Alcohol Alcohol can cause sudden decreases in blood glucose (hypoglycemia), especially if you use insulin or take certain medicines for diabetes mellitus. Hypoglycemia can be a life-threatening condition. Symptoms of hypoglycemia (sleepiness, dizziness, and disorientation) are similar to symptoms of having too much alcohol.  If your health care provider has given you approval to drink alcohol, do so in moderation and use the following guidelines:  Women should not have more than one drink per day, and men should not have more than two drinks per day. One drink is equal to:  12 oz of beer.  5 oz of wine.  1 oz of hard liquor.  Do not drink on an empty stomach.  Keep yourself hydrated. Have water, diet soda, or unsweetened iced tea.  Regular soda, juice, and other mixers might contain a lot of carbohydrates and should be counted. WHAT FOODS ARE NOT RECOMMENDED? As you make food choices, it is important to remember that all foods are not the same. Some foods have fewer nutrients per serving than other foods, even though they might have the same number of calories or carbohydrates. It is difficult to get your body what it needs when you eat foods with fewer nutrients. Examples of foods that you should avoid that are high in calories and carbohydrates but low in nutrients include:  Trans fats (most processed foods list trans fats on the Nutrition Facts label).  Regular soda.  Juice.  Candy.  Sweets, such as cake, pie, doughnuts, and cookies.  Fried foods. WHAT FOODS CAN I EAT? Have nutrient-rich foods,   which will nourish your body and keep you healthy. The food you should eat also will depend on several factors, including:  The calories you need.  The medicines you take.  Your weight.  Your blood glucose level.  Your  blood pressure level.  Your cholesterol level. You also should eat a variety of foods, including:  Protein, such as meat, poultry, fish, tofu, nuts, and seeds (lean animal proteins are best).  Fruits.  Vegetables.  Dairy products, such as milk, cheese, and yogurt (low fat is best).  Breads, grains, pasta, cereal, rice, and beans.  Fats such as olive oil, trans fat-free margarine, canola oil, avocado, and olives. DOES EVERYONE WITH DIABETES MELLITUS HAVE THE SAME MEAL PLAN? Because every person with diabetes mellitus is different, there is not one meal plan that works for everyone. It is very important that you meet with a dietitian who will help you create a meal plan that is just right for you. Document Released: 08/03/2005 Document Revised: 11/11/2013 Document Reviewed: 10/03/2013 ExitCare Patient Information 2015 [employer]. This information is not intended to replace advice given to you by your health care provider. Make sure you discuss any questions you have with your health care provider.  

## 2015-04-08 NOTE — Progress Notes (Signed)
Patient here for follow up on her diabetes and needs  Refills on her medications as well

## 2015-04-08 NOTE — Progress Notes (Signed)
MRN: [id number] Name: Katherine [name]  Sex: female Age: 58 y.o. DOB: 12/12/1955  Allergies: Other  Chief Complaint  Patient presents with  . Follow-up    HPI: Patient is 59 y.o. female who has to of diabetes, hypertension comes today for followup , as per patient she is taking Levemir 35 units 2 times a day and still her fasting blood sugars more than 200 mg/dL,today her hemoglobin A1c is not much improved compared to last visit also blood pressure is elevated, she denies any headache dizziness chest and shortness of breath.patient denies any hypoglycemic symptoms.patient is requesting refill on her medications.  Past Medical History  Diagnosis Date  . Diabetes mellitus   . Hypertension   . Boil of buttock     Past Surgical History  Procedure Laterality Date  . Appendectomy        Medication List       This list is accurate as of: 04/08/15  1:22 PM.  Always use your most recent med list.               amLODipine 10 MG tablet  Commonly known as:  NORVASC  Take 1 tablet (10 mg total) by mouth daily.     chlorhexidine 0.12 % solution  Commonly known as:  PERIDEX  Use as directed 15 mLs in the mouth or throat 2 (two) times daily.     gabapentin 300 MG capsule  Commonly known as:  NEURONTIN  Take 1 capsule (300 mg total) by mouth 2 (two) times daily.     insulin aspart 100 UNIT/ML injection  Commonly known as:  novoLOG  - Inject 0-5 Units into the skin 3 (three) times daily with meals. CBG 70 - 120: 0 units  - CBG 121 - 150: 0 units  - CBG 151 - 200: 0 units  - CBG 201 - 250: 2 units  - CBG 251 - 300: 3 units  - CBG 301 - 350: 4 units  - CBG 351 - 400: 5 units     insulin detemir 100 UNIT/ML injection  Commonly known as:  LEVEMIR  Inject 0.4 mLs (40 Units total) into the skin 2 (two) times daily.     Insulin Syringe-Needle U-100 28G X 1/2" 0.3 ML Misc  Check blood sugar TID & QHS     losartan 50 MG tablet  Commonly known as:  COZAAR  Take 1 tablet  (50 mg total) by mouth daily.     metFORMIN 500 MG tablet  Commonly known as:  GLUCOPHAGE  Take 2 tablets (1,000 mg total) by mouth 2 (two) times daily. Patient states she takes 1000mg  twice a day per patient     multivitamin with minerals Tabs tablet  Take 1 tablet by mouth daily.     ondansetron 4 MG tablet  Commonly known as:  ZOFRAN  Take 1 tablet (4 mg total) by mouth every 6 (six) hours as needed for nausea.     oxyCODONE-acetaminophen 5-325 MG per tablet  Commonly known as:  PERCOCET/ROXICET  Take 1-2 tablets by mouth every 4 (four) hours as needed for severe pain.        Meds ordered this encounter  Medications  . amLODipine (NORVASC) 10 MG tablet    Sig: Take 1 tablet (10 mg total) by mouth daily.    Dispense:  30 tablet    Refill:  3  . insulin aspart (NOVOLOG) 100 UNIT/ML injection    Sig: Inject 0-5 Units into the  skin 3 (three) times daily with meals. CBG 70 - 120: 0 units CBG 121 - 150: 0 units CBG 151 - 200: 0 units CBG 201 - 250: 2 units CBG 251 - 300: 3 units CBG 301 - 350: 4 units CBG 351 - 400: 5 units    Dispense:  10 mL    Refill:  11    Please dispense flex pen. Patient can not use vials due to her hands shake to much  . insulin detemir (LEVEMIR) 100 UNIT/ML injection    Sig: Inject 0.4 mLs (40 Units total) into the skin 2 (two) times daily.    Dispense:  10 mL    Refill:  2    Please dispense flex pen. Patient can not use vials due to her hands shake so much  . metFORMIN (GLUCOPHAGE) 500 MG tablet    Sig: Take 2 tablets (1,000 mg total) by mouth 2 (two) times daily. Patient states she takes 1000mg  twice a day per patient    Dispense:  120 tablet    Refill:  2  . Insulin Syringe-Needle U-100 28G X 1/2" 0.3 ML MISC    Sig: Check blood sugar TID & QHS    Dispense:  100 each    Refill:  5  . losartan (COZAAR) 50 MG tablet    Sig: Take 1 tablet (50 mg total) by mouth daily.    Dispense:  30 tablet    Refill:  3  . gabapentin (NEURONTIN) 300 MG  capsule    Sig: Take 1 capsule (300 mg total) by mouth 2 (two) times daily.    Dispense:  60 capsule    Refill:  3    Immunization History  Administered Date(s) Administered  . Influenza Split 01/13/2013  . Influenza Whole 10/28/2008, 09/10/2009  . Influenza,inj,Quad PF,36+ Mos 12/17/2014  . PPD Test 02/02/2014  . Pneumococcal Polysaccharide-23 07/14/2008, 06/23/2014  . Td 10/21/2009    Family History  Problem Relation Age of Onset  . Diabetes Father   . Hypertension Father   . Cancer Maternal Grandmother   . Hypertension Maternal Grandfather   . Kidney disease Mother   . Hypertension Mother     History  Substance Use Topics  . Smoking status: Never Smoker   . Smokeless tobacco: Not on file  . Alcohol Use: No    Review of Systems   As noted in HPI  Filed Vitals:   04/08/15 1124  BP: 169/68  Pulse: 80  Temp: 98 F (36.7 C)  Resp: 16    Physical Exam  Physical Exam  Constitutional: No distress.  Eyes: EOM are normal. Pupils are equal, round, and reactive to light.  Cardiovascular: Normal rate and regular rhythm.   Pulmonary/Chest: Breath sounds normal. No respiratory distress. She has no wheezes. She has no rales.  Musculoskeletal: She exhibits no edema.    CBC    Component Value Date/Time   WBC 9.7 12/28/2014 0615   RBC 4.96 12/28/2014 0615   HGB 14.0 12/28/2014 0615   HCT 45.2 12/28/2014 0615   PLT 204 12/28/2014 0615   MCV 91.1 12/28/2014 0615   LYMPHSABS 0.6* 12/22/2014 1529   MONOABS 0.9 12/22/2014 1529   EOSABS 0.0 12/22/2014 1529   BASOSABS 0.0 12/22/2014 1529    CMP     Component Value Date/Time   NA 138 01/06/2015 1621   K 4.1 01/06/2015 1621   CL 97 01/06/2015 1621   CO2 33* 01/06/2015 1621   GLUCOSE 244* 01/06/2015 1621  BUN 7 01/06/2015 1621   CREATININE 0.65 01/06/2015 1621   CREATININE 0.64 12/29/2014 0825   CALCIUM 8.8 01/06/2015 1621   PROT 6.4 01/06/2015 1621   ALBUMIN 3.1* 01/06/2015 1621   AST 14 01/06/2015 1621     ALT 21 01/06/2015 1621   ALKPHOS 110 01/06/2015 1621   BILITOT 0.3 01/06/2015 1621   GFRNONAA >89 01/06/2015 1621   GFRNONAA >90 12/29/2014 0825   GFRAA >89 01/06/2015 1621   GFRAA >90 12/29/2014 0825    Lab Results  Component Value Date/Time   CHOL 173 12/05/2013 11:33 AM    Lab Results  Component Value Date/Time   HGBA1C 13.40 04/08/2015 11:21 AM   HGBA1C 14.7* 06/22/2014 05:20 AM    Lab Results  Component Value Date/Time   AST 14 01/06/2015 04:21 PM    Assessment and Plan  Diabetes mellitus due to underlying condition without complications - Plan:  Results for orders placed or performed in visit on 04/08/15  Glucose (CBG)  Result Value Ref Range   POC Glucose 330.0 (A) 70 - 99 mg/dl  HgB A1c  Result Value Ref Range   Hemoglobin A1C 13.40    Diabetes is uncontrolled ,, I have advised patient to increase the dose of November to 14 units 2 times a day, continue with NovoLog sliding scale, metformin, she's also given referral to endocrinology for further evaluation and management.Glucose (CBG), HgB A1c, insulin aspart (NOVOLOG) 100 UNIT/ML injection, insulin detemir (LEVEMIR) 100 UNIT/ML injection, metFORMIN (GLUCOPHAGE) 500 MG tablet, Ambulatory referral to Endocrinology, gabapentin (NEURONTIN) 300 MG capsule  Essential hypertension - Plan: blood pressure is uncontrolled, she'll continue with amlodipine, I have increased the dose of Cozaar, advise patient for DASH diet, she'll come back in 2 weeks per nurse visit  amLODipine (NORVASC) 10 MG tablet, losartan (COZAAR) 50 MG tablet    Return in about 3 months (around 07/09/2015) for diabetes, hypertension, BP and CBG  check in 2 weeks/Nurse Visit.   This note has been created with Surveyor, quantity. Any transcriptional errors are unintentional.    Lorayne Marek, MD

## 2015-04-20 ENCOUNTER — Encounter (HOSPITAL_COMMUNITY): Payer: Self-pay | Admitting: Physical Medicine and Rehabilitation

## 2015-04-20 ENCOUNTER — Emergency Department (HOSPITAL_COMMUNITY)
Admission: EM | Admit: 2015-04-20 | Discharge: 2015-04-20 | Disposition: A | Discharge status: Home / Self Care | Payer: Self-pay | Attending: Emergency Medicine | Admitting: Emergency Medicine

## 2015-04-20 DIAGNOSIS — R269 Unspecified abnormalities of gait and mobility: Secondary | ICD-10-CM | POA: Insufficient documentation

## 2015-04-20 DIAGNOSIS — I10 Essential (primary) hypertension: Secondary | ICD-10-CM | POA: Insufficient documentation

## 2015-04-20 DIAGNOSIS — Z79899 Other long term (current) drug therapy: Secondary | ICD-10-CM | POA: Insufficient documentation

## 2015-04-20 DIAGNOSIS — M5416 Radiculopathy, lumbar region: Secondary | ICD-10-CM | POA: Insufficient documentation

## 2015-04-20 DIAGNOSIS — Z794 Long term (current) use of insulin: Secondary | ICD-10-CM | POA: Insufficient documentation

## 2015-04-20 DIAGNOSIS — Z872 Personal history of diseases of the skin and subcutaneous tissue: Secondary | ICD-10-CM | POA: Insufficient documentation

## 2015-04-20 DIAGNOSIS — E119 Type 2 diabetes mellitus without complications: Secondary | ICD-10-CM | POA: Insufficient documentation

## 2015-04-20 LAB — CBC WITH DIFFERENTIAL/PLATELET
Basophils Absolute: 0 10*3/uL (ref 0.0–0.1)
Basophils Relative: 0 % (ref 0–1)
EOS PCT: 2 % (ref 0–5)
Eosinophils Absolute: 0.1 10*3/uL (ref 0.0–0.7)
HEMATOCRIT: 42 % (ref 36.0–46.0)
Hemoglobin: 14 g/dL (ref 12.0–15.0)
LYMPHS ABS: 1.6 10*3/uL (ref 0.7–4.0)
Lymphocytes Relative: 31 % (ref 12–46)
MCH: 28.3 pg (ref 26.0–34.0)
MCHC: 33.3 g/dL (ref 30.0–36.0)
MCV: 84.8 fL (ref 78.0–100.0)
Monocytes Absolute: 0.3 10*3/uL (ref 0.1–1.0)
Monocytes Relative: 6 % (ref 3–12)
Neutro Abs: 3.2 10*3/uL (ref 1.7–7.7)
Neutrophils Relative %: 61 % (ref 43–77)
Platelets: 215 10*3/uL (ref 150–400)
RBC: 4.95 MIL/uL (ref 3.87–5.11)
RDW: 12.8 % (ref 11.5–15.5)
WBC: 5.2 10*3/uL (ref 4.0–10.5)

## 2015-04-20 LAB — BASIC METABOLIC PANEL
Anion gap: 10 (ref 5–15)
BUN: 11 mg/dL (ref 6–20)
CO2: 27 mmol/L (ref 22–32)
Calcium: 9.1 mg/dL (ref 8.9–10.3)
Chloride: 100 mmol/L — ABNORMAL LOW (ref 101–111)
Creatinine, Ser: 0.71 mg/dL (ref 0.44–1.00)
GFR calc Af Amer: >60 mL/min (ref >60)
GFR calc non Af Amer: >60 mL/min (ref >60)
Glucose, Bld: 284 mg/dL — ABNORMAL HIGH (ref 65–99)
POTASSIUM: 3.9 mmol/L (ref 3.5–5.1)
Sodium: 137 mmol/L (ref 135–145)

## 2015-04-20 MED ORDER — TRAMADOL HCL 50 MG PO TABS: 50.0000 mg | ORAL_TABLET | Freq: Four times a day (QID) | ORAL | Status: DC | PRN

## 2015-04-20 MED ORDER — OXYCODONE-ACETAMINOPHEN 5-325 MG PO TABS
1.0000 | ORAL_TABLET | Freq: Once | ORAL | Status: AC
  Administered 2015-04-20: 1 via ORAL
  Filled 2015-04-20: qty 1

## 2015-04-20 MED ORDER — SODIUM CHLORIDE 0.9 % IV BOLUS (SEPSIS)
2000.0000 mL | Freq: Once | INTRAVENOUS | Status: AC
  Administered 2015-04-20: 2000 mL via INTRAVENOUS

## 2015-04-20 NOTE — ED Provider Notes (Addendum)
CSN: [id number]     Arrival date & time 04/20/15  0847 History   First MD Initiated Contact with Patient 04/20/15 [phone]     Chief Complaint  Patient presents with  . Leg Pain     (Consider location/radiation/quality/duration/timing/severity/associated sxs/prior Treatment) HPI Complains of pain from right buttock to right foot onset 1 month ago. Pain is worse with walking and worst at her foot.. Improved with remaining still. Pain is worse over the past 2 days. She was recently prescribed gabapentin for neuropathy, which she's taken without relief. She's also taken oxycodone in the past with partial relief. She denies back pain. No other associated symptoms. Patient had MRI of lumbar spine January 2016 showing mild spinal stenosis L4-L5. No fever no other associated symptoms Past Medical History  Diagnosis Date  . Diabetes mellitus   . Hypertension   . Boil of buttock    Past Surgical History  Procedure Laterality Date  . Appendectomy     Family History  Problem Relation Age of Onset  . Diabetes Father   . Hypertension Father   . Cancer Maternal Grandmother   . Hypertension Maternal Grandfather   . Kidney disease Mother   . Hypertension Mother    History  Substance Use Topics  . Smoking status: Never Smoker   . Smokeless tobacco: Not on file  . Alcohol Use: No   no drug use OB History    No data available     Review of Systems  Constitutional: Negative.   HENT: Negative.   Respiratory: Negative.   Cardiovascular: Negative.   Gastrointestinal: Negative.   Musculoskeletal: Positive for myalgias and gait problem.       Walks with cane sometimes  Skin: Negative.   Neurological: Negative.   Psychiatric/Behavioral: Negative.       Allergies  Other  Home Medications   Prior to Admission medications   Medication Sig Start Date End Date Taking? Authorizing Provider  amLODipine (NORVASC) 10 MG tablet Take 1 tablet (10 mg total) by mouth daily. 04/08/15   Lorayne Marek, MD  chlorhexidine (PERIDEX) 0.12 % solution Use as directed 15 mLs in the mouth or throat 2 (two) times daily. 12/01/14   Lorayne Marek, MD  gabapentin (NEURONTIN) 300 MG capsule Take 1 capsule (300 mg total) by mouth 2 (two) times daily. 04/08/15   Lorayne Marek, MD  insulin aspart (NOVOLOG) 100 UNIT/ML injection Inject 0-5 Units into the skin 3 (three) times daily with meals. CBG 70 - 120: 0 units CBG 121 - 150: 0 units CBG 151 - 200: 0 units CBG 201 - 250: 2 units CBG 251 - 300: 3 units CBG 301 - 350: 4 units CBG 351 - 400: 5 units 04/08/15   Deepak Advani, MD  insulin detemir (LEVEMIR) 100 UNIT/ML injection Inject 0.4 mLs (40 Units total) into the skin 2 (two) times daily. 04/08/15   Lorayne Marek, MD  Insulin Syringe-Needle U-100 28G X 1/2" 0.3 ML MISC Check blood sugar TID & QHS 04/08/15   Lorayne Marek, MD  losartan (COZAAR) 50 MG tablet Take 1 tablet (50 mg total) by mouth daily. 04/08/15   Lorayne Marek, MD  metFORMIN (GLUCOPHAGE) 500 MG tablet Take 2 tablets (1,000 mg total) by mouth 2 (two) times daily. Patient states she takes 1000mg  twice a day per patient 04/08/15   Lorayne Marek, MD  Multiple Vitamin (MULTIVITAMIN WITH MINERALS) TABS tablet Take 1 tablet by mouth daily.    Historical Provider, MD  ondansetron (ZOFRAN) 4 MG  tablet Take 1 tablet (4 mg total) by mouth every 6 (six) hours as needed for nausea. 12/29/14   Belkys A Regalado, MD  oxyCODONE-acetaminophen (PERCOCET/ROXICET) 5-325 MG per tablet Take 1-2 tablets by mouth every 4 (four) hours as needed for severe pain. Patient not taking: Reported on 12/17/2014 12/11/14   Debby Freiberg, MD   BP 164/68 mmHg  Pulse 80  Temp(Src) 98.7 F (37.1 C) (Oral)  Resp 18  SpO2 98% Physical Exam  Constitutional: She appears well-developed and well-nourished.  HENT:  Head: Normocephalic and atraumatic.  Eyes: Conjunctivae are normal. Pupils are equal, round, and reactive to light.  Neck: Neck supple. No tracheal deviation present.  No thyromegaly present.  Cardiovascular: Normal rate and regular rhythm.   No murmur heard. Pulmonary/Chest: Effort normal and breath sounds normal.  Abdominal: Soft. Bowel sounds are normal. She exhibits no distension. There is no tenderness.  Musculoskeletal: Normal range of motion. She exhibits no edema or tenderness.  Entire spine nontender. All 4 extremities are redness swelling or tenderness. DP pulses 2+ bilaterally  Neurological: She is alert. Coordination normal.  Skin: Skin is warm and dry. No rash noted.  Psychiatric: She has a normal mood and affect.  Nursing note and vitals reviewed.   ED Course  Procedures (including critical care time) Labs Review Labs Reviewed  CBG MONITORING, ED    Imaging Review No results found.   EKG Interpretation None     12 noon patient feels improved after treatment with oxycodone and intravenous fluids. Blood sugar has come down after treatment with intravenous fluids.ambulates without difficulty Results for orders placed or performed during the hospital encounter of 04/20/15  CBC with Differential/Platelet  Result Value Ref Range   WBC 5.2 4.0 - 10.5 K/uL   RBC 4.95 3.87 - 5.11 MIL/uL   Hemoglobin 14.0 12.0 - 15.0 g/dL   HCT 42.0 36.0 - 46.0 %   MCV 84.8 78.0 - 100.0 fL   MCH 28.3 26.0 - 34.0 pg   MCHC 33.3 30.0 - 36.0 g/dL   RDW 12.8 11.5 - 15.5 %   Platelets 215 150 - 400 K/uL   Neutrophils Relative % 61 43 - 77 %   Neutro Abs 3.2 1.7 - 7.7 K/uL   Lymphocytes Relative 31 12 - 46 %   Lymphs Abs 1.6 0.7 - 4.0 K/uL   Monocytes Relative 6 3 - 12 %   Monocytes Absolute 0.3 0.1 - 1.0 K/uL   Eosinophils Relative 2 0 - 5 %   Eosinophils Absolute 0.1 0.0 - 0.7 K/uL   Basophils Relative 0 0 - 1 %   Basophils Absolute 0.0 0.0 - 0.1 K/uL  Basic metabolic panel  Result Value Ref Range   Sodium 137 135 - 145 mmol/L   Potassium 3.9 3.5 - 5.1 mmol/L   Chloride 100 (L) 101 - 111 mmol/L   CO2 27 22 - 32 mmol/L   Glucose, Bld 284 (H)  65 - 99 mg/dL   BUN 11 6 - 20 mg/dL   Creatinine, Ser 0.71 0.44 - 1.00 mg/dL   Calcium 9.1 8.9 - 10.3 mg/dL   GFR calc non Af Amer >60 >60 mL/min   GFR calc Af Amer >60 >60 mL/min   Anion gap 10 5 - 15   No results found.  MDM  I suspect patient has component of lumbar radiculopathy. She admits to some noncompliance with her medications. Blood sugars have been difficult to control Case discussed with Dr.Deepak at [facility] in  community [employer]. He suggested prescription tramadol, which I will write. Patient has scheduled follow-up appointment at [facility] for 05/03/2015 Final diagnoses:  None   diagnosis #1 lumbar radiculopathy #2 hyperglycemia      Orlie Dakin, MD 04/20/15 1206  Orlie Dakin, MD 04/20/15 1209

## 2015-04-20 NOTE — ED Notes (Signed)
Per triage pt's HAL [phone]

## 2015-04-20 NOTE — Discharge Instructions (Signed)
Take your medications as prescribed. Take Tylenol for mild pain or the medication prescribed for bad pain. Keep your scheduled appointment with [facility] May 03 2015. Return if your condition worsens for any reason

## 2015-04-20 NOTE — ED Notes (Signed)
Pt presents to department for evaluation of L leg pain. Reports issues with neuropathy. 7/10 pain, increases with walking, denies recent injury. Pt is alert and oriented x4.

## 2015-04-21 LAB — CBG MONITORING, ED: GLUCOSE-CAPILLARY: 365 mg/dL — AB (ref 65–99)

## 2015-04-23 ENCOUNTER — Telehealth: Payer: Self-pay

## 2015-04-23 NOTE — Telephone Encounter (Signed)
Returned patient phone call Patient not available Left message on voice mail to return our call 

## 2015-05-03 ENCOUNTER — Ambulatory Visit: Payer: Self-pay | Attending: Internal Medicine | Admitting: *Deleted

## 2015-05-03 ENCOUNTER — Ambulatory Visit: Payer: Self-pay

## 2015-05-03 VITALS — BP 118/67 | HR 76 | Temp 98.4°F | Resp 16 | Ht 72.0 in | Wt 208.0 lb

## 2015-05-03 DIAGNOSIS — I10 Essential (primary) hypertension: Secondary | ICD-10-CM | POA: Insufficient documentation

## 2015-05-03 DIAGNOSIS — E1165 Type 2 diabetes mellitus with hyperglycemia: Secondary | ICD-10-CM | POA: Insufficient documentation

## 2015-05-03 DIAGNOSIS — Z794 Long term (current) use of insulin: Secondary | ICD-10-CM | POA: Insufficient documentation

## 2015-05-03 DIAGNOSIS — E089 Diabetes mellitus due to underlying condition without complications: Secondary | ICD-10-CM

## 2015-05-03 LAB — GLUCOSE, POCT (MANUAL RESULT ENTRY): POC GLUCOSE: 232 mg/dL — AB (ref 70–99)

## 2015-05-03 MED ORDER — GLUCOSE BLOOD VI STRP: ORAL_STRIP | Status: DC

## 2015-05-03 MED ORDER — INSULIN DETEMIR 100 UNIT/ML ~~LOC~~ SOLN: 40.0000 [IU] | Freq: Two times a day (BID) | SUBCUTANEOUS | Status: DC

## 2015-05-03 MED ORDER — GABAPENTIN 300 MG PO CAPS: 300.0000 mg | ORAL_CAPSULE | Freq: Three times a day (TID) | ORAL | Status: DC

## 2015-05-03 MED ORDER — INSULIN DETEMIR 100 UNIT/ML ~~LOC~~ SOLN: 43.0000 [IU] | Freq: Two times a day (BID) | SUBCUTANEOUS | Status: DC

## 2015-05-03 MED ORDER — TRUE METRIX METER W/DEVICE KIT: PACK | Status: DC

## 2015-05-03 NOTE — Patient Instructions (Signed)
Hypoglycemia °Hypoglycemia occurs when the glucose in your blood is too low. Glucose is a type of sugar that is your body's main energy source. Hormones, such as insulin and glucagon, control the level of glucose in the blood. Insulin lowers blood glucose and glucagon increases blood glucose. Having too much insulin in your blood stream, or not eating enough food containing sugar, can result in hypoglycemia. Hypoglycemia can happen to people with or without diabetes. It can develop quickly and can be a medical emergency.  °CAUSES  °· Missing or delaying meals. °· Not eating enough carbohydrates at meals. °· Taking too much diabetes medicine. °· Not timing your oral diabetes medicine or insulin doses with meals, snacks, and exercise. °· Nausea and vomiting. °· Certain medicines. °· Severe illnesses, such as hepatitis, kidney disorders, and certain eating disorders. °· Increased activity or exercise without eating something extra or adjusting medicines. °· Drinking too much alcohol. °· A nerve disorder that affects body functions like your heart rate, blood pressure, and digestion (autonomic neuropathy). °· A condition where the stomach muscles do not function properly (gastroparesis). Therefore, medicines and food may not absorb properly. °· Rarely, a tumor of the pancreas can produce too much insulin. °SYMPTOMS  °· Hunger. °· Sweating (diaphoresis). °· Change in body temperature. °· Shakiness. °· Headache. °· Anxiety. °· Lightheadedness. °· Irritability. °· Difficulty concentrating. °· Dry mouth. °· Tingling or numbness in the hands or feet. °· Restless sleep or sleep disturbances. °· Altered speech and coordination. °· Change in mental status. °· Seizures or prolonged convulsions. °· Combativeness. °· Drowsiness (lethargic). °· Weakness. °· Increased heart rate or palpitations. °· Confusion. °· Pale, gray skin color. °· Blurred or double vision. °· Fainting. °DIAGNOSIS  °A physical exam and medical history will be  performed. Your caregiver may make a diagnosis based on your symptoms. Blood tests and other lab tests may be performed to confirm a diagnosis. Once the diagnosis is made, your caregiver will see if your signs and symptoms go away once your blood glucose is raised.  °TREATMENT  °Usually, you can easily treat your hypoglycemia when you notice symptoms. °· Check your blood glucose. If it is less than 70 mg/dl, take one of the following:   °¨ 3-4 glucose tablets.   °¨ ½ cup juice.   °¨ ½ cup regular soda.   °¨ 1 cup skim milk.   °¨ ½-1 tube of glucose gel.   °¨ 5-6 hard candies.   °· Avoid high-fat drinks or food that may delay a rise in blood glucose levels. °· Do not take more than the recommended amount of sugary foods, drinks, gel, or tablets. Doing so will cause your blood glucose to go too high.   °· Wait 10-15 minutes and recheck your blood glucose. If it is still less than 70 mg/dl or below your target range, repeat treatment.   °· Eat a snack if it is more than 1 hour until your next meal.   °There may be a time when your blood glucose may go so low that you are unable to treat yourself at home when you start to notice symptoms. You may need someone to help you. You may even faint or be unable to swallow. If you cannot treat yourself, someone will need to bring you to the hospital.  °HOME CARE INSTRUCTIONS °· If you have diabetes, follow your diabetes management plan by: °¨ Taking your medicines as directed. °¨ Following your exercise plan. °¨ Following your meal plan. Do not skip meals. Eat on time. °¨ Testing your blood   glucose regularly. Check your blood glucose before and after exercise. If you exercise longer or different than usual, be sure to check blood glucose more frequently. °¨ Wearing your medical alert jewelry that says you have diabetes. °· Identify the cause of your hypoglycemia. Then, develop ways to prevent the recurrence of hypoglycemia. °· Do not take a hot bath or shower right after an  insulin shot. °· Always carry treatment with you. Glucose tablets are the easiest to carry. °· If you are going to drink alcohol, drink it only with meals. °· Tell friends or family members ways to keep you safe during a seizure. This may include removing hard or sharp objects from the area or turning you on your side. °· Maintain a healthy weight. °SEEK MEDICAL CARE IF:  °· You are having problems keeping your blood glucose in your target range. °· You are having frequent episodes of hypoglycemia. °· You feel you might be having side effects from your medicines. °· You are not sure why your blood glucose is dropping so low. °· You notice a change in vision or a new problem with your vision. °SEEK IMMEDIATE MEDICAL CARE IF:  °· Confusion develops. °· A change in mental status occurs. °· The inability to swallow develops. °· Fainting occurs. °Document Released: 11/06/2005 Document Revised: 11/11/2013 Document Reviewed: 03/04/2012 °[employer]® Patient Information ©2015 [employer]. This information is not intended to replace advice given to you by your health care provider. Make sure you discuss any questions you have with your health care provider. ° °

## 2015-05-03 NOTE — Progress Notes (Signed)
Patient presents for CBG and record review for T2DM, however, patient did not bring log or meter Patient states she is not checking BS on regular basis Taking levemir 40 units bid, metformin 1000 mg bid Has been injecting 10 units novolog insulin bid without knowing BS.  Reeducated on importance of knowing BS prior to injecting insulin due to potential for hypoglycemia Med list reviewed; patient reports taking all meds as directed Taking losartan 50 mg daily Patient reports AM fasting blood sugars ranging 213-342 Patient reports 2 hours after lunch blood sugars ranging 318-457 Patient reports before dinner blood sugars ranging 323-427 States she doesn't think glucometer is working correctly and has become very discouraged by BS reading in 300s and 400s which makes her less likely to check BS True test glucometer and strips e-scribed to [facility] Denies increased thirst and urination C/o sharp pain right leg; rates 10/10 at present; taking gabapentin with minimal relief Has not yet seen endocrinologist and can't afford Diabetic Nutrion and Education classes  CBG 213 AM fasting per patient CBG 232 in office 4 + hours after eating  Lab Results  Component Value Date   HGBA1C 13.40 04/08/2015    Filed Vitals:   05/03/15 1433  BP: 118/67  Pulse: 76  Temp: 98.4 F (36.9 C)  Resp: 16  WT 208 Edema noted at both ankles. Discussed elevating LE while sitting. Patient does not add salt to foods or cook with salt  Per PCP: Increase levemir to 43 units bid Increase gabapentin to 300 mg tid  Patient advised to call for med refills at least 7 days before running out so as not to go without.  Patient to return in 2 weeks for nurse visit for CBG and record review

## 2015-07-07 ENCOUNTER — Ambulatory Visit: Payer: No Typology Code available for payment source | Attending: Family Medicine | Admitting: *Deleted

## 2015-07-07 DIAGNOSIS — Z111 Encounter for screening for respiratory tuberculosis: Secondary | ICD-10-CM | POA: Insufficient documentation

## 2015-07-07 DIAGNOSIS — Z0000 Encounter for general adult medical examination without abnormal findings: Secondary | ICD-10-CM

## 2015-07-07 NOTE — Progress Notes (Signed)
Patient presents for PPD placement for work Denies previous positive TB test  Denies known exposure to TB   Tuberculin skin test applied to left ventral forearm.  Patient aware that she needs to return in 48 hours for PPD reading   

## 2015-07-09 ENCOUNTER — Ambulatory Visit: Payer: Self-pay | Attending: Family Medicine | Admitting: *Deleted

## 2015-07-09 LAB — TB SKIN TEST
Induration: 0 mm
TB Skin Test: NEGATIVE

## 2015-07-09 NOTE — Progress Notes (Signed)
PPD Reading Note PPD read and results entered in Epic Result: 0 mm induration. Interpretation: Negative Allergic reaction: No Letter provided for employer    

## 2015-07-16 ENCOUNTER — Ambulatory Visit: Payer: Self-pay

## 2015-07-30 ENCOUNTER — Other Ambulatory Visit: Payer: Self-pay

## 2015-07-30 ENCOUNTER — Telehealth: Payer: Self-pay | Admitting: Internal Medicine

## 2015-07-30 DIAGNOSIS — Z1231 Encounter for screening mammogram for malignant neoplasm of breast: Secondary | ICD-10-CM

## 2015-07-30 DIAGNOSIS — E089 Diabetes mellitus due to underlying condition without complications: Secondary | ICD-10-CM

## 2015-07-30 NOTE — Telephone Encounter (Signed)
Patient called requesting a medication refill for insulin detemir (LEVEMIR), insulin aspart (NOVOLOG) Please follow up.

## 2015-08-02 ENCOUNTER — Telehealth: Payer: Self-pay

## 2015-08-02 DIAGNOSIS — E089 Diabetes mellitus due to underlying condition without complications: Secondary | ICD-10-CM

## 2015-08-02 MED ORDER — INSULIN DETEMIR 100 UNIT/ML ~~LOC~~ SOLN: 43.0000 [IU] | Freq: Two times a day (BID) | SUBCUTANEOUS | Status: DC

## 2015-08-02 MED ORDER — INSULIN ASPART 100 UNIT/ML ~~LOC~~ SOLN
0.0000 [IU] | Freq: Three times a day (TID) | SUBCUTANEOUS | Status: DC
Start: 2015-08-02 — End: 2015-11-03

## 2015-08-02 NOTE — Telephone Encounter (Signed)
Returned patient phone call Patient had dropped her insulin and the vial broke Patient requesting a refill [facility]

## 2015-08-02 NOTE — Telephone Encounter (Signed)
Rx refills send to [facility]  Unable to contact Pt, no voice mail

## 2015-08-03 ENCOUNTER — Ambulatory Visit
Admission: RE | Admit: 2015-08-03 | Discharge: 2015-08-03 | Disposition: A | Discharge status: Home / Self Care | Payer: No Typology Code available for payment source | Source: Ambulatory Visit

## 2015-08-03 DIAGNOSIS — Z1231 Encounter for screening mammogram for malignant neoplasm of breast: Secondary | ICD-10-CM

## 2015-09-29 ENCOUNTER — Encounter: Payer: Self-pay | Admitting: Internal Medicine

## 2015-09-29 ENCOUNTER — Ambulatory Visit: Payer: No Typology Code available for payment source | Attending: Internal Medicine | Admitting: Internal Medicine

## 2015-09-29 VITALS — BP 192/83 | HR 76 | Temp 98.0°F | Resp 16 | Ht 72.0 in | Wt 209.6 lb

## 2015-09-29 DIAGNOSIS — E118 Type 2 diabetes mellitus with unspecified complications: Secondary | ICD-10-CM

## 2015-09-29 DIAGNOSIS — E1165 Type 2 diabetes mellitus with hyperglycemia: Secondary | ICD-10-CM | POA: Insufficient documentation

## 2015-09-29 DIAGNOSIS — Z794 Long term (current) use of insulin: Secondary | ICD-10-CM | POA: Insufficient documentation

## 2015-09-29 DIAGNOSIS — IMO0002 Reserved for concepts with insufficient information to code with codable children: Secondary | ICD-10-CM

## 2015-09-29 DIAGNOSIS — I10 Essential (primary) hypertension: Secondary | ICD-10-CM

## 2015-09-29 LAB — POCT GLYCOSYLATED HEMOGLOBIN (HGB A1C): Hemoglobin A1C: 13.2

## 2015-09-29 LAB — GLUCOSE, POCT (MANUAL RESULT ENTRY): POC Glucose: 169 mg/dl — AB (ref 70–99)

## 2015-09-29 MED ORDER — LOSARTAN POTASSIUM 100 MG PO TABS: 100.0000 mg | ORAL_TABLET | Freq: Every day | ORAL | Status: DC

## 2015-09-29 MED ORDER — METFORMIN HCL 500 MG PO TABS: 1000.0000 mg | ORAL_TABLET | Freq: Two times a day (BID) | ORAL | Status: DC

## 2015-09-29 MED ORDER — AMLODIPINE BESYLATE 10 MG PO TABS: 10.0000 mg | ORAL_TABLET | Freq: Every day | ORAL | Status: DC

## 2015-09-29 MED ORDER — CLONIDINE HCL 0.1 MG PO TABS
0.1000 mg | ORAL_TABLET | Freq: Once | ORAL | Status: AC
  Administered 2015-09-29: 0.1 mg via ORAL

## 2015-09-29 NOTE — Progress Notes (Signed)
Patient ID: Katherine [name], female   DOB: 1956-07-26, 59 y.o.   MRN: [id number] SUBJECTIVE: 59 y.o. female for follow up of diabetes and hypertension. States that she took both blood pressures medication this morning. She states that she has checked her blood pressure outside of here and it has been elevated as well.  Checks her sugars twice daily and most are above 300. She states that she is taking 10 units of Novolog with each meal. She is currently taking 50 units of Levemir twice per day. She has attempted to change her diet but is not seeing any improvement. Has been on Januvia in the past and it caused renal insufficiency. She denies chest pain, palpitations, edema, headaches, blurred vision, polyuria. Still has some neuropathy of BLE.   Current Outpatient Prescriptions  Medication Sig Dispense Refill  . amLODipine (NORVASC) 10 MG tablet Take 1 tablet (10 mg total) by mouth daily. 30 tablet 3  . gabapentin (NEURONTIN) 300 MG capsule Take 1 capsule (300 mg total) by mouth 3 (three) times daily. 60 capsule 3  . insulin aspart (NOVOLOG) 100 UNIT/ML injection Inject 0-5 Units into the skin 3 (three) times daily with meals. CBG 70 - 120: 0 units CBG 121 - 150: 0 units CBG 151 - 200: 0 units CBG 201 - 250: 2 units CBG 251 - 300: 3 units CBG 301 - 350: 4 units CBG 351 - 400: 5 units 10 mL 11  . insulin detemir (LEVEMIR) 100 UNIT/ML injection Inject 0.43 mLs (43 Units total) into the skin 2 (two) times daily. 10 mL 2  . losartan (COZAAR) 50 MG tablet Take 1 tablet (50 mg total) by mouth daily. 30 tablet 3  . metFORMIN (GLUCOPHAGE) 500 MG tablet Take 2 tablets (1,000 mg total) by mouth 2 (two) times daily. Patient states she takes $RemoveB'1000mg'[id number]$  twice a day per patient 120 tablet 2  . Blood Glucose Monitoring Suppl (TRUE METRIX METER) W/DEVICE KIT Check BS 4 times daily before meals and at bedtime 1 kit 0  . glucose blood (TRUETEST TEST) test strip Use as instructed 100 each 12  . Insulin Syringe-Needle  U-100 28G X 1/2" 0.3 ML MISC Check blood sugar TID & QHS 100 each 5  . Multiple Vitamin (MULTIVITAMIN WITH MINERALS) TABS tablet Take 1 tablet by mouth daily.    . traMADol (ULTRAM) 50 MG tablet Take 1 tablet (50 mg total) by mouth every 6 (six) hours as needed. (Patient not taking: Reported on 05/03/2015) 40 tablet 0  . [DISCONTINUED] sitaGLIPtin (JANUVIA) 100 MG tablet Take 100 mg by mouth daily.     No current facility-administered medications for this visit.  Review of Systems: Other than what is stated in HPI, all other systems are negative.    OBJECTIVE: Appearance: alert, well appearing, and in no distress and oriented to person, place, and time. BP 192/83 mmHg  Pulse 76  Temp(Src) 98 F (36.7 C)  Resp 16  Ht 6' (1.829 m)  Wt 209 lb 9.6 oz (95.074 kg)  BMI 28.42 kg/m2  SpO2 100%  Exam: heart sounds normal rate, regular rhythm, normal S1, S2, no murmurs, rubs, clicks or gallops, no JVD, no edema, chest clear, no carotid bruits, feet: warm, good capillary refill, no trophic changes or ulcerative lesions, normal DP and PT pulses, normal monofilament exam and normal sensory exam.  ASSESSMENT: Katherine [name] was seen today for follow-up.  Diagnoses and all orders for this visit:  Uncontrolled type 2 diabetes mellitus with complication, with long-term current  use of insulin (HCC) -     Glucose (CBG) -     HgB A1c -     Microalbumin, urine -     metFORMIN (GLUCOPHAGE) 500 MG tablet; Take 2 tablets (1,000 mg total) by mouth 2 (two) times daily. Patient states she takes $RemoveB'1000mg'nBEyyFQg$  twice a day per patient -     Basic Metabolic Panel Patient is on high dose of Levemir. I strongly believe patient needs to see Endocrinology for a possible switch to U-500. Patient is uninsured. I will have pharmacist work with patient to see if she can get approved for patient assistance to receive Invokana until she is able to see Endo. I have asked that she apply for [facility] discount very soon.   Essential  hypertension -     losartan (COZAAR) 100 MG tablet; Take 1 tablet (100 mg total) by mouth daily. -     amLODipine (NORVASC) 10 MG tablet; Take 1 tablet (10 mg total) by mouth daily. -     cloNIDine (CATAPRES) tablet 0.1 mg; Take 1 tablet (0.1 mg total) by mouth once in office Patient's BP is severely elevated. I will increase her Losartan to 100 mg and recheck her pressure in 1 week.    Return in about 1 week (around 10/06/2015) for Nurse Visit-BP check and 6 weeks PCP .   Lance Bosch, NP 10/05/2015 12:10 PM

## 2015-09-29 NOTE — Progress Notes (Signed)
Patient here for follow up on her diabetes and HTN Patient presents in office with elevated blood pressure Patient did state that she took her medications today

## 2015-09-29 NOTE — Patient Instructions (Addendum)
I have increased your Losartan (BP medication) to 100 mg once per day. I want you to come back for a BP check in 1 week since I am changing your dose.   I want you to apply for [facility] discount letter so that we can hopefully get you in to see a endocrinologist

## 2015-09-30 LAB — BASIC METABOLIC PANEL
BUN: 10 mg/dL (ref 7–25)
CALCIUM: 9.6 mg/dL (ref 8.6–10.4)
CHLORIDE: 98 mmol/L (ref 98–110)
CO2: 35 mmol/L — AB (ref 20–31)
CREATININE: 0.88 mg/dL (ref 0.50–1.05)
GLUCOSE: 96 mg/dL (ref 65–99)
Potassium: 4.1 mmol/L (ref 3.5–5.3)
Sodium: 140 mmol/L (ref 135–146)

## 2015-09-30 LAB — MICROALBUMIN, URINE: Microalb, Ur: 73.6 mg/dL

## 2015-10-06 ENCOUNTER — Ambulatory Visit: Payer: No Typology Code available for payment source | Attending: Internal Medicine | Admitting: Pharmacist

## 2015-10-06 VITALS — BP 135/88 | HR 96

## 2015-10-06 DIAGNOSIS — IMO0002 Reserved for concepts with insufficient information to code with codable children: Secondary | ICD-10-CM

## 2015-10-06 DIAGNOSIS — E118 Type 2 diabetes mellitus with unspecified complications: Secondary | ICD-10-CM

## 2015-10-06 DIAGNOSIS — Z794 Long term (current) use of insulin: Secondary | ICD-10-CM | POA: Insufficient documentation

## 2015-10-06 DIAGNOSIS — E1165 Type 2 diabetes mellitus with hyperglycemia: Secondary | ICD-10-CM | POA: Insufficient documentation

## 2015-10-06 MED ORDER — METFORMIN HCL ER 500 MG PO TB24: 1000.0000 mg | ORAL_TABLET | Freq: Two times a day (BID) | ORAL | Status: DC

## 2015-10-06 NOTE — Patient Instructions (Signed)
Thanks for coming to see me today!  Keep taking your medications.  Fill out the [place] paperwork and come back and see me in 2 weeks!

## 2015-10-06 NOTE — Progress Notes (Signed)
S:    Patient arrives in good spirits.  Presents for diabetes follow up and blood pressure follow up.   Patient reports adherence with medications. Current diabetes medications include Levemir 50 units BID and Novolog sliding scale, as well as metformin. However, she reports that the smell of the metformin makes her nauseated and she would like to stop taking it.   Patient denies hypoglycemic events.  Patient reported dietary habits: trying to watch what she eats and eat more protein and vegetables and less carbs.  Patient reported exercise habits: does not exercise   Patient reports nocturia.  Patient reports neuropathy. Patient denies visual changes. Patient reports self foot exams.    O:  Lab Results  Component Value Date   HGBA1C 13.20 09/29/2015    Home fasting CBG: 100s - 200s  2 hour post-prandial/random CBG: 100s - 200s.   BP Readings from Last 3 Encounters:  10/06/15 135/88  09/29/15 192/83  05/03/15 118/67     A/P: Diabetes currently uncontrolled based on A1c of 13.2 and home readings.   Patient denies hypoglycemic events and is able to verbalize appropriate hypoglycemia management plan.  Patient reports adherence with medication. Control is suboptimal due to inadequate insulin dosing and sedentary lifestyle.  Changed metformin from immediate release to extended release to see if the extended release is more palatable. It is a very important medication and I would like her to be able to stay on it. Will not make any adjustments to her insulin at this time as I would like more readings prior to dose changes. I think she requires a higher Novolog dose but would like to confirm that with more readings. Provided patient with new blood glucose logs for her to use and to bring back to her next appointment with me. Also provided patient paperwork for Invokana, and she will obtain her financial information and bring the paperwork back to me so we can complete our portion.  Congratulated patient on her motivation to control her diabetes.   Blood pressure controlled on current medications. Will not make any changes at this time but will continue to monitor.   Next A1C anticipated February 2017.    Medication reconciliation completed. Written patient instructions provided.  Total time in face to face counseling 20 minutes.   Follow up in Pharmacist Clinic Visit in two weeks.   Patient seen with Blain Pais, nursing student.

## 2015-10-12 ENCOUNTER — Telehealth: Payer: Self-pay

## 2015-10-12 NOTE — Telephone Encounter (Signed)
Patient not available Message left on voice mail to return our call 

## 2015-10-12 NOTE — Telephone Encounter (Signed)
-----   Message from Lance Bosch, NP sent at 10/11/2015  5:02 PM EST ----- Patient has protein in urine from her diabetes. Make sure she is taking her losartan daily because this can lower the amount of protein. It is also essential she gets control of her diabetes or this can damage her kidneys over time

## 2015-10-13 ENCOUNTER — Encounter: Payer: Self-pay | Admitting: Pharmacist

## 2015-10-13 NOTE — Progress Notes (Signed)
Received paperwork back from patient for the [facility] and [facility] Patient Assistance Program for [place]. Will follow up with our pharmacy to see if they can process this request electronically or if I need to mail it in for the patient.   Nicoletta Ba, PharmD, BCPS, [facility] [phone]

## 2015-10-20 ENCOUNTER — Ambulatory Visit: Payer: No Typology Code available for payment source | Attending: Internal Medicine | Admitting: Pharmacist

## 2015-10-20 DIAGNOSIS — E118 Type 2 diabetes mellitus with unspecified complications: Secondary | ICD-10-CM

## 2015-10-20 DIAGNOSIS — Z794 Long term (current) use of insulin: Secondary | ICD-10-CM | POA: Insufficient documentation

## 2015-10-20 DIAGNOSIS — IMO0002 Reserved for concepts with insufficient information to code with codable children: Secondary | ICD-10-CM

## 2015-10-20 DIAGNOSIS — E1165 Type 2 diabetes mellitus with hyperglycemia: Secondary | ICD-10-CM | POA: Insufficient documentation

## 2015-10-20 LAB — GLUCOSE, POCT (MANUAL RESULT ENTRY): POC GLUCOSE: 366 mg/dL — AB (ref 70–99)

## 2015-10-20 MED ORDER — METFORMIN HCL ER 500 MG PO TB24: 500.0000 mg | ORAL_TABLET | Freq: Two times a day (BID) | ORAL | Status: DC

## 2015-10-20 NOTE — Progress Notes (Signed)
S:    Patient arrives in good spirits.  Presents for diabetes follow up.  Patient reports adherence with medications. Current diabetes medications include Levemir 50 units BID and Novolog sliding scale, as well as metformin XL.   Patient reports one hypoglycemic event. She had a reading of 30 this morning per her log book. She appropriately treated (a lot of sugar in her coffee) it and then ate some breakfast after. She did not take her Novolog insulin this morning after being afraid of the blood glucose being so low. She ate dinner last night and had only roast beef. She reports taking 20 units of Novolog with that.   Patient reported dietary habits: trying to watch what she eats and eat more protein and vegetables and less carbs.  Patient reported exercise habits: does not exercise   Patient reports nocturia.  Patient reports neuropathy. Patient denies visual changes. Patient reports self foot exams. She would like to see a podiatrist.   Patient reports diarrhea. She no longer has the nausea with her metformin but the diarrhea started when she switched to the XL formulation. She reports that the diarrhea occurs every morning and that this is what made her late for her appointment today.    O:  Lab Results  Component Value Date   HGBA1C 13.20 09/29/2015    Home fasting CBG: 30; 78- 270s 2 hour post-prandial/random CBG: 100s - 240s (most <200).  POCT glucose = 366  A/P: Diabetes currently uncontrolled based on A1c of 13.2 and home readings.   Patient reports hypoglycemic events and is able to verbalize appropriate hypoglycemia management plan. The patient agreed to immediately call clinic if she has another hypoglycemic reading and to call 911 if she has another severe hypoglycemic event.  Patient reports adherence with medication. Control is suboptimal due to sedentary lifestyle.  Patient refused insulin during visit. She wants to go home and eat lunch and take her normal Novolog  dose.   Continue Levemir 50 units twice a day. Extensively counseled patient to skip her Novolog if she has a meal with no carbs. Provided patient with educational material on what had carbs and how much carbs were in different food items. I think this is what led to the hypoglycemic event this morning - only eating some meat last night and nothing else. Decreased metformin to 500 mg XL BID to see if that mitigates the diarrhea. If not, will plan on discontinuing at next visit. Will not proceed with Invokana at this time due to hypoglycemia. Also requested that patient bring in meter to next visit so I can make sure it is working correctly - I am surprised she did not have more symptoms with the blood glucose of 30 so I am not completely sure that it is accurate but will assume at this time that it is.   Next A1C anticipated February 2017.    Written patient instructions provided.  Total time in face to face counseling 35 minutes.  Follow up in Pharmacist Clinic Visit in two weeks.

## 2015-10-20 NOTE — Patient Instructions (Signed)
Thank you for coming to see me today.  Please call the clinic if you have another low blood sugar  Only take the metformin 1 tablet twice a day and see if this helps with the diarrhea. If it does not - stop the metformin  DO NOT take the Novolog if you only eat a meal with meat and vegetables. You need to have carbs (sugar, pasta, rice, bread, potatoes, etc) in your meal or your blood sugar will get too low with the insulin.  Come back and see me in 2 weeks.

## 2015-10-22 ENCOUNTER — Other Ambulatory Visit: Payer: Self-pay | Admitting: Pharmacist

## 2015-10-22 MED ORDER — TRUE METRIX METER W/DEVICE KIT: PACK | Status: DC

## 2015-10-22 MED ORDER — GLUCOSE BLOOD VI STRP: ORAL_STRIP | Status: DC

## 2015-11-03 ENCOUNTER — Other Ambulatory Visit: Payer: Self-pay | Admitting: Internal Medicine

## 2015-11-03 ENCOUNTER — Encounter: Payer: No Typology Code available for payment source | Admitting: Pharmacist

## 2015-11-03 DIAGNOSIS — E089 Diabetes mellitus due to underlying condition without complications: Secondary | ICD-10-CM

## 2015-11-03 DIAGNOSIS — Z794 Long term (current) use of insulin: Principal | ICD-10-CM

## 2015-11-03 MED ORDER — INSULIN ASPART 100 UNIT/ML FLEXPEN: 0.0000 [IU] | PEN_INJECTOR | Freq: Three times a day (TID) | SUBCUTANEOUS | Status: DC

## 2015-11-03 MED ORDER — INSULIN DETEMIR 100 UNIT/ML ~~LOC~~ SOLN: 50.0000 [IU] | Freq: Two times a day (BID) | SUBCUTANEOUS | Status: DC

## 2015-11-04 ENCOUNTER — Other Ambulatory Visit: Payer: Self-pay | Admitting: Internal Medicine

## 2015-11-04 DIAGNOSIS — Z794 Long term (current) use of insulin: Principal | ICD-10-CM

## 2015-11-04 DIAGNOSIS — E089 Diabetes mellitus due to underlying condition without complications: Secondary | ICD-10-CM

## 2015-11-04 MED ORDER — INSULIN DETEMIR 100 UNIT/ML ~~LOC~~ SOLN: 50.0000 [IU] | Freq: Two times a day (BID) | SUBCUTANEOUS | Status: DC

## 2015-11-04 MED ORDER — INSULIN ASPART 100 UNIT/ML ~~LOC~~ SOLN: 0.0000 [IU] | Freq: Three times a day (TID) | SUBCUTANEOUS | Status: DC

## 2015-12-09 MED FILL — !LEVEMIR FLEXPEN 100UNITS/M: 100U/ML (3) | 19 days supply | Qty: 12 | Fill #4

## 2015-12-09 MED FILL — $novoLOG FLEXPEN SYRINGE: 100 | 30 days supply | Qty: 15 | Fill #1

## 2016-01-12 ENCOUNTER — Encounter: Payer: Self-pay | Admitting: Internal Medicine

## 2016-01-12 ENCOUNTER — Ambulatory Visit: Payer: No Typology Code available for payment source | Attending: Internal Medicine | Admitting: Internal Medicine

## 2016-01-12 VITALS — BP 181/84 | HR 70 | Temp 98.0°F | Resp 16 | Ht 72.0 in | Wt 211.0 lb

## 2016-01-12 DIAGNOSIS — Z79899 Other long term (current) drug therapy: Secondary | ICD-10-CM | POA: Insufficient documentation

## 2016-01-12 DIAGNOSIS — IMO0001 Reserved for inherently not codable concepts without codable children: Secondary | ICD-10-CM

## 2016-01-12 DIAGNOSIS — Z23 Encounter for immunization: Secondary | ICD-10-CM | POA: Insufficient documentation

## 2016-01-12 DIAGNOSIS — Z794 Long term (current) use of insulin: Secondary | ICD-10-CM | POA: Insufficient documentation

## 2016-01-12 DIAGNOSIS — E1165 Type 2 diabetes mellitus with hyperglycemia: Secondary | ICD-10-CM | POA: Insufficient documentation

## 2016-01-12 DIAGNOSIS — Z7984 Long term (current) use of oral hypoglycemic drugs: Secondary | ICD-10-CM | POA: Insufficient documentation

## 2016-01-12 LAB — POCT URINALYSIS DIPSTICK
Bilirubin, UA: NEGATIVE
Glucose, UA: >1000
Ketones, UA: NEGATIVE
LEUKOCYTES UA: NEGATIVE
NITRITE UA: NEGATIVE
PROTEIN UA: 100
SPEC GRAV UA: 1.01
UROBILINOGEN UA: 0.2
pH, UA: 5

## 2016-01-12 LAB — POCT GLYCOSYLATED HEMOGLOBIN (HGB A1C): Hemoglobin A1C: 13.1

## 2016-01-12 LAB — GLUCOSE, POCT (MANUAL RESULT ENTRY): POC Glucose: 340 mg/dl — AB (ref 70–99)

## 2016-01-12 MED ORDER — CHLORHEXIDINE GLUCONATE 0.12 % MT SOLN: 15.0000 mL | Freq: Two times a day (BID) | OROMUCOSAL | Status: DC

## 2016-01-12 MED ORDER — INSULIN ASPART 100 UNIT/ML ~~LOC~~ SOLN: SUBCUTANEOUS | Status: DC

## 2016-01-12 NOTE — Progress Notes (Signed)
Patient here for follow up on her diabetes Patient is concerned her am blood sugars have been elevated Presents in office with blood sugar of 340 and stated she has taken Her insulin today

## 2016-01-12 NOTE — Progress Notes (Signed)
Patient ID: Katherine [name], female   DOB: 10-24-1956, 60 y.o.   MRN: [id number] SUBJECTIVE: 60 y.o. female for follow up of diabetes. Patient reports that she uses 20 units of Novolog 3 times per day. She states that she has experienced hypoglycemia twice after giving herself 20 units when discovering her sugars went up after eating a meal. She did not bring a sugar log today. Diabetic Review of Systems - diabetic diet compliance: noncompliant much of the time, further diabetic ROS: no polyuria or polydipsia, no chest pain, dyspnea or TIA's, no numbness, tingling or pain in extremities, no unusual visual symptoms.  Other symptoms and concerns: Patient reports that she is sad several days because she is unable to get her sugars controlled.  Current Outpatient Prescriptions  Medication Sig Dispense Refill  . amLODipine (NORVASC) 10 MG tablet Take 1 tablet (10 mg total) by mouth daily. 30 tablet 5  . gabapentin (NEURONTIN) 300 MG capsule Take 1 capsule (300 mg total) by mouth 3 (three) times daily. 60 capsule 3  . insulin aspart (NOVOLOG) 100 UNIT/ML injection Inject 0-5 Units into the skin 3 (three) times daily before meals. 10 mL 3  . insulin detemir (LEVEMIR) 100 UNIT/ML injection Inject 0.5 mLs (50 Units total) into the skin 2 (two) times daily. 30 mL 3  . losartan (COZAAR) 100 MG tablet Take 1 tablet (100 mg total) by mouth daily. 30 tablet 5  . metFORMIN (GLUCOPHAGE XR) 500 MG 24 hr tablet Take 1 tablet (500 mg total) by mouth 2 (two) times daily with a meal. 120 tablet 12  . Multiple Vitamin (MULTIVITAMIN WITH MINERALS) TABS tablet Take 1 tablet by mouth daily.    . Blood Glucose Monitoring Suppl (TRUE METRIX METER) W/DEVICE KIT USE AS INSTRUCTED 1 kit 0  . glucose blood (TRUE METRIX BLOOD GLUCOSE TEST) test strip Use as instructed 100 each 12  . Insulin Syringe-Needle U-100 28G X 1/2" 0.3 ML MISC Check blood sugar TID & QHS 100 each 5  . [DISCONTINUED] sitaGLIPtin (JANUVIA) 100 MG tablet Take 100  mg by mouth daily.     No current facility-administered medications for this visit.  Review of Systems: Other than what is stated in HPI, all other systems are negative.   OBJECTIVE: Appearance: alert, well appearing, and in no distress, oriented to person, place, and time and normal appearing weight. BP 181/84 mmHg  Pulse 70  Temp(Src) 98 F (36.7 C)  Resp 16  Ht 6' (1.829 m)  Wt 211 lb (95.709 kg)  BMI 28.61 kg/m2  SpO2 100%  Exam: heart sounds normal rate, regular rhythm, normal S1, S2, no murmurs, rubs, clicks or gallops, no JVD, chest clear, no carotid bruits, feet: warm, good capillary refill, no trophic changes or ulcerative lesions, normal DP and PT pulses, normal monofilament exam and normal sensory exam  ASSESSMENT: Katherine [name] was seen today for follow-up.  Diagnoses and all orders for this visit:  Uncontrolled type 2 diabetes mellitus without complication, with long-term current use of insulin (HCC) -     Glucose (CBG) -     HgB A1c -     Urinalysis Dipstick -     Flu Vaccine QUAD 36+ mos PF IM (Fluarix & Fluzone Quad PF) -     Ambulatory referral to Podiatry -     insulin aspart (NOVOLOG) 100 UNIT/ML injection; Check sugars 3 times per day before meals If sugars are 150-200 give 3 units, 201-250 give 5 units, 251-300, give 7 units 301-350, give  9 units Patient will continue on Levemir 50 units twice per day. I have written her a new sliding scale to prevent hypoglycemia. I have discussed that she should wait 2 hours after a meal to check her sugars which should be 180 and below. If patient is still unsuccessful with sliding scale or having hypoglycemic events it may be reasonable to d/c Novolog and switch to a GLP-1. I have stressed that she must bring her sugar log to follow up visit in order for me to make appropriate changes.   PLAN: See orders for this visit as documented in the electronic medical record. Issues reviewed with her: diabetic diet discussed in detail,  written exchange diet given, low cholesterol diet, weight control and daily exercise discussed, home glucose monitoring emphasized, foot care discussed and Podiatry visits discussed, annual eye examinations at Ophthalmology discussed and long term diabetic complications discussed.  Return in about 3 weeks (around 02/02/2016) for PCP DM f/u .  Lance Bosch, NP 01/13/2016 9:03 AM

## 2016-01-12 NOTE — Patient Instructions (Signed)
Only use Novolog per sliding scale Check sugars 3 times per day before meals If sugars are 150-200 give 3 units, 201-250 give 5 units, 251-300, give 7 units 301-350, give 9 units

## 2016-02-02 ENCOUNTER — Encounter: Payer: Self-pay | Admitting: Internal Medicine

## 2016-02-02 ENCOUNTER — Ambulatory Visit: Payer: No Typology Code available for payment source | Attending: Internal Medicine | Admitting: Internal Medicine

## 2016-02-02 VITALS — BP 208/78 | HR 78 | Temp 98.3°F | Resp 18 | Ht 72.0 in | Wt 210.0 lb

## 2016-02-02 DIAGNOSIS — K219 Gastro-esophageal reflux disease without esophagitis: Secondary | ICD-10-CM | POA: Insufficient documentation

## 2016-02-02 DIAGNOSIS — Z794 Long term (current) use of insulin: Secondary | ICD-10-CM | POA: Insufficient documentation

## 2016-02-02 DIAGNOSIS — E1165 Type 2 diabetes mellitus with hyperglycemia: Secondary | ICD-10-CM

## 2016-02-02 DIAGNOSIS — E785 Hyperlipidemia, unspecified: Secondary | ICD-10-CM | POA: Insufficient documentation

## 2016-02-02 DIAGNOSIS — Z79899 Other long term (current) drug therapy: Secondary | ICD-10-CM | POA: Insufficient documentation

## 2016-02-02 DIAGNOSIS — E118 Type 2 diabetes mellitus with unspecified complications: Secondary | ICD-10-CM

## 2016-02-02 DIAGNOSIS — I10 Essential (primary) hypertension: Secondary | ICD-10-CM | POA: Insufficient documentation

## 2016-02-02 DIAGNOSIS — IMO0002 Reserved for concepts with insufficient information to code with codable children: Secondary | ICD-10-CM

## 2016-02-02 DIAGNOSIS — Z888 Allergy status to other drugs, medicaments and biological substances status: Secondary | ICD-10-CM | POA: Insufficient documentation

## 2016-02-02 LAB — GLUCOSE, POCT (MANUAL RESULT ENTRY): POC GLUCOSE: 207 mg/dL — AB (ref 70–99)

## 2016-02-02 MED ORDER — HYDRALAZINE HCL 10 MG PO TABS: 10.0000 mg | ORAL_TABLET | Freq: Three times a day (TID) | ORAL | Status: DC

## 2016-02-02 NOTE — Progress Notes (Signed)
Patient ID: Katherine [name], female   DOB: 12/01/1955, 61 y.o.   MRN: [id number]  CC:  Follow up  HPI: Katherine [name] is a 60 y.o. female here today for diabetes follow up.  Patient has past medical history of HTN, hyperlipidemia, DM II, and GERD.  Patient presents with blood sugar logs from the last two weeks.  Morning blood sugars are elevated around 200.  Lunch and dinner time blood sugars remain in normal range, less than 130.  Patient states she is taking 20 units of Novolog before meals, despite sliding scale order given last visit and 50 units of Levemir in the morning and dinner time (around 8pm).  Patient denies any s/s of hypo/hyperglycemia. Patient BP elevated this visit.  Patient refused anti-hypertensive medication.  Patient denies CP, SOB, headaches, lightheadedness, dizziness, blurred vision, nausea, or vomiting.       Allergies  Allergen Reactions  . Other Swelling    Seaweed= swelling on arms, hands and face   Past Medical History  Diagnosis Date  . Diabetes mellitus   . Hypertension   . Boil of buttock    Current Outpatient Prescriptions on File Prior to Visit  Medication Sig Dispense Refill  . amLODipine (NORVASC) 10 MG tablet Take 1 tablet (10 mg total) by mouth daily. 30 tablet 5  . Blood Glucose Monitoring Suppl (TRUE METRIX METER) W/DEVICE KIT USE AS INSTRUCTED 1 kit 0  . chlorhexidine (PERIDEX) 0.12 % solution Use as directed 15 mLs in the mouth or throat 2 (two) times daily. 120 mL 0  . gabapentin (NEURONTIN) 300 MG capsule Take 1 capsule (300 mg total) by mouth 3 (three) times daily. 60 capsule 3  . glucose blood (TRUE METRIX BLOOD GLUCOSE TEST) test strip Use as instructed 100 each 12  . insulin aspart (NOVOLOG) 100 UNIT/ML injection Check sugars 3 times per day before meals If sugars are 150-200 give 3 units, 201-250 give 5 units, 251-300, give 7 units 301-350, give 9 units 10 mL 3  . insulin detemir (LEVEMIR) 100 UNIT/ML injection Inject 0.5 mLs (50 Units total)  into the skin 2 (two) times daily. 30 mL 3  . Insulin Syringe-Needle U-100 28G X 1/2" 0.3 ML MISC Check blood sugar TID & QHS 100 each 5  . losartan (COZAAR) 100 MG tablet Take 1 tablet (100 mg total) by mouth daily. 30 tablet 5  . metFORMIN (GLUCOPHAGE XR) 500 MG 24 hr tablet Take 1 tablet (500 mg total) by mouth 2 (two) times daily with a meal. 120 tablet 12  . Multiple Vitamin (MULTIVITAMIN WITH MINERALS) TABS tablet Take 1 tablet by mouth daily.    . [DISCONTINUED] sitaGLIPtin (JANUVIA) 100 MG tablet Take 100 mg by mouth daily.     No current facility-administered medications on file prior to visit.   Family History  Problem Relation Age of Onset  . Diabetes Father   . Hypertension Father   . Cancer Maternal Grandmother   . Hypertension Maternal Grandfather   . Kidney disease Mother   . Hypertension Mother    Social History   Social History  . Marital Status: Married    Spouse Name: N/A  . Number of Children: N/A  . Years of Education: N/A   Occupational History  . Not on file.   Social History Main Topics  . Smoking status: Never Smoker   . Smokeless tobacco: Not on file  . Alcohol Use: No  . Drug Use: No  . Sexual Activity: Not on file  Other Topics Concern  . Not on file   Social History Narrative    Review of Systems: Constitutional: Negative for fever, chills, diaphoresis, activity change, appetite change and fatigue. HENT: Negative for ear pain, nosebleeds, congestion, facial swelling, rhinorrhea, neck pain, neck stiffness and ear discharge.  Eyes: Negative for pain, discharge, redness, itching and visual disturbance. Respiratory: Negative for cough, choking, chest tightness, shortness of breath, wheezing and stridor.  Cardiovascular: Negative for chest pain, palpitations and leg swelling. Musculoskeletal: Negative for back pain, joint swelling, arthralgias and gait problem. Neurological: Negative for dizziness, tremors, seizures, syncope, facial asymmetry,  speech difficulty, weakness, light-headedness, numbness and headaches.   Objective:   Filed Vitals:   02/02/16 1240  BP: 208/78  Pulse: 78  Temp: 98.3 F (36.8 C)  Resp: 18    Physical Exam: Constitutional: Patient appears well-developed and well-nourished. No distress. HENT: Normocephalic, atraumatic, External right and left ear normal.   Eyes: Conjunctivae and EOM are normal.  Neck: Normal ROM. Neck supple. No JVD. No tracheal deviation.  CVS: RRR, S1/S2 +, no murmurs, no gallops, no carotid bruit.  Pulmonary: Effort and breath sounds normal, no stridor, rhonchi, wheezes, rales.  Skin: Skin is warm and dry. No rash noted. Not diaphoretic. No erythema. No pallor. Psychiatric: Normal mood and affect. Behavior, judgment, thought content normal.  Lab Results  Component Value Date   WBC 5.2 04/20/2015   HGB 14.0 04/20/2015   HCT 42.0 04/20/2015   MCV 84.8 04/20/2015   PLT 215 04/20/2015   Lab Results  Component Value Date   CREATININE 0.88 09/29/2015   BUN 10 09/29/2015   NA 140 09/29/2015   K 4.1 09/29/2015   CL 98 09/29/2015   CO2 35* 09/29/2015    Lab Results  Component Value Date   HGBA1C 13.1 01/12/2016   Lipid Panel     Component Value Date/Time   CHOL 173 12/05/2013 1133   TRIG 166* 12/05/2013 1133   HDL 49 12/05/2013 1133   CHOLHDL 3.5 12/05/2013 1133   VLDL 33 12/05/2013 1133   LDLCALC 91 12/05/2013 1133       Assessment and plan:   Katherine [name] was seen today for follow-up.  Diagnoses and all orders for this visit:  Uncontrolled type 2 diabetes mellitus with complication, with long-term current use of insulin (HCC) -     Glucose (CBG)  Blood sugars appear to be improving based on log review. Patient instructed to take Levemir closer to 10 pm to see if that will decrease the AM blood sugars.  Patient to keep log and return in 2 weeks.  HYPERTENSION, BENIGN ESSENTIAL -    Begin hydrALAZINE (APRESOLINE) 10 MG tablet; Take 1 tablet (10 mg total) by  mouth 3 (three) times daily. Patient's BP is still elevated today. I have added Hydralazine to medication regimen in addition to other medications.  Patient to return in 2 weeks for a BP check.  If BP is still above 140/90 on repeat check in 2 weeks then I will increase dose.   Return in about 2 weeks (around 02/16/2016) for Stacy-BP and log review and 3 mo PCP .       Melissa Noon, BSN, RN, NP student [facility] [phone] 02/02/2016, 1:19 PM

## 2016-02-02 NOTE — Patient Instructions (Signed)
I have added Hydralazine to your medications for blood pressure. You will take this in addition to your Amlodipine and Losartan.   Take last dose of Levemir later in evening around 9 or 10 pm to help with morning sugars. You will return with sugar log in 2 more weeks and have BP checked at that time.

## 2016-02-02 NOTE — Progress Notes (Signed)
Patient is here for FU DM  Patient complains of pain in the abdomen described as a band around her waist beginning at midnight last night. Pain is scaled currently at a 4.  Patient would like a copy of her TB results.   Patient is requesting refills for Peridex, TrueMetrix Strips, Levemir and Multivitamin.

## 2016-02-03 MED FILL — hydrALAZINE HCL 10 MG TABS: 10 | 30 days supply | Qty: 90 | Fill #0

## 2016-02-09 ENCOUNTER — Other Ambulatory Visit: Payer: Self-pay | Admitting: Pharmacist

## 2016-02-09 MED ORDER — INSULIN PEN NEEDLE 32G X 4 MM MISC: Status: DC

## 2016-02-09 MED FILL — ULTICARE PEN NDL 4MM 32G: 32G X 4 MM | 25 days supply | Qty: 100 | Fill #0

## 2016-02-09 MED FILL — $Levemir Flexpen 100u/pen: 100 | 30 days supply | Qty: 30 | Fill #0

## 2016-02-17 ENCOUNTER — Encounter: Payer: Self-pay | Admitting: Pharmacist

## 2016-02-17 ENCOUNTER — Ambulatory Visit: Payer: No Typology Code available for payment source | Attending: Internal Medicine | Admitting: Pharmacist

## 2016-02-17 VITALS — BP 136/85 | HR 78

## 2016-02-17 DIAGNOSIS — E1165 Type 2 diabetes mellitus with hyperglycemia: Secondary | ICD-10-CM

## 2016-02-17 DIAGNOSIS — IMO0002 Reserved for concepts with insufficient information to code with codable children: Secondary | ICD-10-CM

## 2016-02-17 DIAGNOSIS — E118 Type 2 diabetes mellitus with unspecified complications: Secondary | ICD-10-CM

## 2016-02-17 DIAGNOSIS — R351 Nocturia: Secondary | ICD-10-CM | POA: Insufficient documentation

## 2016-02-17 DIAGNOSIS — E1140 Type 2 diabetes mellitus with diabetic neuropathy, unspecified: Secondary | ICD-10-CM | POA: Insufficient documentation

## 2016-02-17 DIAGNOSIS — Z794 Long term (current) use of insulin: Secondary | ICD-10-CM | POA: Insufficient documentation

## 2016-02-17 DIAGNOSIS — Z79899 Other long term (current) drug therapy: Secondary | ICD-10-CM | POA: Insufficient documentation

## 2016-02-17 DIAGNOSIS — I10 Essential (primary) hypertension: Secondary | ICD-10-CM | POA: Insufficient documentation

## 2016-02-17 DIAGNOSIS — E119 Type 2 diabetes mellitus without complications: Secondary | ICD-10-CM | POA: Insufficient documentation

## 2016-02-17 DIAGNOSIS — IMO0001 Reserved for inherently not codable concepts without codable children: Secondary | ICD-10-CM

## 2016-02-17 MED ORDER — INSULIN ASPART 100 UNIT/ML ~~LOC~~ SOLN: SUBCUTANEOUS | Status: DC

## 2016-02-17 MED ORDER — HYDRALAZINE HCL 25 MG PO TABS: 25.0000 mg | ORAL_TABLET | Freq: Three times a day (TID) | ORAL | Status: DC

## 2016-02-17 MED ORDER — LOSARTAN POTASSIUM 100 MG PO TABS: 100.0000 mg | ORAL_TABLET | Freq: Every day | ORAL | Status: DC

## 2016-02-17 MED ORDER — AMLODIPINE BESYLATE 10 MG PO TABS: 10.0000 mg | ORAL_TABLET | Freq: Every day | ORAL | Status: DC

## 2016-02-17 MED ORDER — HYDRALAZINE HCL 10 MG PO TABS: 10.0000 mg | ORAL_TABLET | Freq: Three times a day (TID) | ORAL | Status: DC

## 2016-02-17 NOTE — Progress Notes (Signed)
S:    Patient arrives in good spirits.  Presents for diabetes management and blood pressure management at the request of Chari Manning. Patient was referred on 02/02/16.  Patient was last seen by Primary Care Provider on 02/02/16.   Patient reports adherence with medications.  Current diabetes medications include: Novolog sliding scale and Levemir 50 units BID. Current hypertension medications include: hydralazine 10 mg TID, losartan 100 mg daily, and amlodipine 10 mg daily.   Patient reports hypoglycemic events. She had one reading of 69 in the evening after she did not eat a few meal but still took her insulin.   Patient reported exercise habits: none   Patient reports nocturia.  Patient reports neuropathy. Patient denies visual changes. Patient reports self foot exams.    O:  Lab Results  Component Value Date   HGBA1C 13.1 01/12/2016   Filed Vitals:   02/17/16 0932  BP: 136/85  Pulse: 78    Home fasting CBG: 100-241  2 hour post-prandial/random CBG: 69 - 423 (mostly in the 200s)   A/P: Diabetes longstanding currently UNcontrolled based on A1c of 13.1 and home readings. Patient reports hypoglycemic events and is able to verbalize appropriate hypoglycemia management plan. Patient reports adherence with medication. Control is suboptimal due to dietary indiscretion and sedentary lifestyle.  Continued basal insulin Levemir (insulin determir) 50 units BID. Increased dose of rapid insulin Novolog (insulin aspart) sliding scale by 2 units for each range. Reviewed this with the patient and she was able to repeat the correct instructions. Instructed patient to not take her insulin if she does not eat and to not take the insulin until she knows she is going to eat a full meal. Next A1C anticipated May 2017.    Hypertension longstanding currently controlled on current medications.  Patient reports adherence with medications. Continue all medications as prescribed.  Medication management:  patient would like all of her oral medications to be sent to [facility] and all of her diabetes medications and supplies to be sent to our pharmacy.   Written patient instructions provided.  Total time in face to face counseling 20 minutes.  Follow up in Pharmacist Clinic Visit in 2 weeks for CBG log review.

## 2016-02-17 NOTE — Patient Instructions (Addendum)
Thank you for coming to see me!  Increase your sliding scale Novolog to If sugars are 150-200 give 5 units, 201-250 give 7 units, 251-300, give 9 units 301-350, give 11 units  Come back in 2 weeks for a blood sugar log review

## 2016-02-23 ENCOUNTER — Encounter (HOSPITAL_COMMUNITY): Payer: Self-pay

## 2016-02-23 ENCOUNTER — Emergency Department (HOSPITAL_COMMUNITY)
Admission: EM | Admit: 2016-02-23 | Discharge: 2016-02-23 | Disposition: A | Discharge status: Home / Self Care | Payer: No Typology Code available for payment source | Attending: Emergency Medicine | Admitting: Emergency Medicine

## 2016-02-23 DIAGNOSIS — Z7984 Long term (current) use of oral hypoglycemic drugs: Secondary | ICD-10-CM | POA: Insufficient documentation

## 2016-02-23 DIAGNOSIS — H9311 Tinnitus, right ear: Secondary | ICD-10-CM

## 2016-02-23 DIAGNOSIS — I10 Essential (primary) hypertension: Secondary | ICD-10-CM

## 2016-02-23 DIAGNOSIS — H6121 Impacted cerumen, right ear: Secondary | ICD-10-CM | POA: Insufficient documentation

## 2016-02-23 DIAGNOSIS — Z79899 Other long term (current) drug therapy: Secondary | ICD-10-CM | POA: Insufficient documentation

## 2016-02-23 DIAGNOSIS — Z794 Long term (current) use of insulin: Secondary | ICD-10-CM | POA: Insufficient documentation

## 2016-02-23 DIAGNOSIS — Z7982 Long term (current) use of aspirin: Secondary | ICD-10-CM | POA: Insufficient documentation

## 2016-02-23 DIAGNOSIS — E119 Type 2 diabetes mellitus without complications: Secondary | ICD-10-CM | POA: Insufficient documentation

## 2016-02-23 DIAGNOSIS — Z872 Personal history of diseases of the skin and subcutaneous tissue: Secondary | ICD-10-CM | POA: Insufficient documentation

## 2016-02-23 DIAGNOSIS — R42 Dizziness and giddiness: Secondary | ICD-10-CM | POA: Insufficient documentation

## 2016-02-23 MED ORDER — MECLIZINE HCL 12.5 MG PO TABS: 12.5000 mg | ORAL_TABLET | Freq: Three times a day (TID) | ORAL | Status: DC | PRN

## 2016-02-23 NOTE — ED Provider Notes (Signed)
CSN: [id number]     Arrival date & time 02/23/16  1354 History  By signing my name below, I, Randa Evens, attest that this documentation has been prepared under the direction and in the presence of [employer], PA-C. Electronically Signed: Randa Evens, ED Scribe. 02/23/2016. 4:26 PM.      Chief Complaint  Patient presents with  . ear ringing    The history is provided by the patient. No language interpreter was used.   HPI Comments: Katherine [name] is a 60 y.o. female who presents to the Emergency Department complaining of new worsening left ear ringing onset 4 days prior. Pt states that the ringing has become constant and louder over the last 24 hours.  Pt reports some Occasional dizziness, patient is a bag on description; currently no dizziness.. Pt states that dizziness is worse when ambulating or head movements. She states that the dizziness improves when laying down and resting. Pt states she is suppose to get her ears washed out every 6 months due to cerumen build up. Pt states that she did try some ear wax cleaner with no relief. Pt denies HA, fever, congestion, nausea vomiting or weight loss. Pt denies injury or trauma to the ear or head. Pt does report recently starting a daily aspirin 1 week prior. Patient denies any neurological deficits.    Past Medical History  Diagnosis Date  . Diabetes mellitus   . Hypertension   . Boil of buttock    Past Surgical History  Procedure Laterality Date  . Appendectomy     Family History  Problem Relation Age of Onset  . Diabetes Father   . Hypertension Father   . Cancer Maternal Grandmother   . Hypertension Maternal Grandfather   . Kidney disease Mother   . Hypertension Mother    Social History  Substance Use Topics  . Smoking status: Never Smoker   . Smokeless tobacco: None  . Alcohol Use: No   OB History    No data available     Review of Systems  All other systems reviewed and are negative.    Allergies   Other  Home Medications   Prior to Admission medications   Medication Sig Start Date End Date Taking? Authorizing Provider  amLODipine (NORVASC) 10 MG tablet Take 1 tablet (10 mg total) by mouth daily. 02/17/16   Lance Bosch, NP  Blood Glucose Monitoring Suppl (TRUE METRIX METER) W/DEVICE KIT USE AS INSTRUCTED 10/22/15   Lance Bosch, NP  chlorhexidine (PERIDEX) 0.12 % solution Use as directed 15 mLs in the mouth or throat 2 (two) times daily. 01/12/16   Lance Bosch, NP  gabapentin (NEURONTIN) 300 MG capsule Take 1 capsule (300 mg total) by mouth 3 (three) times daily. 05/03/15   Lorayne Marek, MD  glucose blood (TRUE METRIX BLOOD GLUCOSE TEST) test strip Use as instructed 10/22/15   Lance Bosch, NP  hydrALAZINE (APRESOLINE) 10 MG tablet Take 1 tablet (10 mg total) by mouth 3 (three) times daily. 02/17/16   Lance Bosch, NP  insulin aspart (NOVOLOG) 100 UNIT/ML injection Check sugars before meals If sugars are 150-200 give 5 units,201-250 give 7 units,251-300, give 9 units,301-350, give 11 units 02/17/16   Lance Bosch, NP  insulin detemir (LEVEMIR) 100 UNIT/ML injection Inject 0.5 mLs (50 Units total) into the skin 2 (two) times daily. 11/04/15   Tresa Garter, MD  Insulin Pen Needle (ULTICARE MICRO PEN NEEDLES) 32G X 4 MM MISC USE  AS DIRECTED 02/09/16   Lance Bosch, NP  Insulin Syringe-Needle U-100 28G X 1/2" 0.3 ML MISC Check blood sugar TID & QHS 04/08/15   Lorayne Marek, MD  losartan (COZAAR) 100 MG tablet Take 1 tablet (100 mg total) by mouth daily. 02/17/16   Lance Bosch, NP  meclizine (ANTIVERT) 12.5 MG tablet Take 1 tablet (12.5 mg total) by mouth 3 (three) times daily as needed for dizziness. 02/23/16   Okey Regal, PA-C  metFORMIN (GLUCOPHAGE XR) 500 MG 24 hr tablet Take 1 tablet (500 mg total) by mouth 2 (two) times daily with a meal. 10/20/15   Tresa Garter, MD  Multiple Vitamin (MULTIVITAMIN WITH MINERALS) TABS tablet Take 1 tablet by mouth daily.     Historical Provider, MD   BP 179/89 mmHg  Pulse 70  Temp(Src) 98.2 F (36.8 C) (Oral)  Resp 16  SpO2 98%   Physical Exam  Constitutional: She is oriented to person, place, and time. She appears well-developed and well-nourished. No distress.  HENT:  Head: Normocephalic and atraumatic.  Significant cerumen Burton in the right ear, small amount of tympanic membrane visualized with no signs of infection, left TM normal  Eyes: Conjunctivae and EOM are normal.  Neck: Neck supple. No tracheal deviation present.  Cardiovascular: Normal rate.   Pulmonary/Chest: Effort normal. No respiratory distress.  Musculoskeletal: Normal range of motion.  Neurological: She is alert and oriented to person, place, and time. She has normal strength. No cranial nerve deficit or sensory deficit. GCS eye subscore is 4. GCS verbal subscore is 5. GCS motor subscore is 6.  Skin: Skin is warm and dry.  Psychiatric: She has a normal mood and affect. Her behavior is normal.  Nursing note and vitals reviewed.   ED Course  Procedures (including critical care time) DIAGNOSTIC STUDIES: Oxygen Saturation is 96% on RA, adequate by my interpretation.    COORDINATION OF CARE: 4:25 PM-Discussed treatment plan with pt at bedside and pt agreed to plan.    Labs Review Labs Reviewed - No data to display  Imaging Review No results found.    EKG Interpretation None      MDM   Final diagnoses:  Tinnitus, right  Essential hypertension   Labs:  Imaging:  Consults:  Therapeutics:  Discharge Meds: Antivert  Assessment/Plan: 60 year old female presents today with tinnitus. Patient reports intermittent episodes she describes this is crackling. She reports this is been remained persistent. Patient notes that she's had episodes of vertigo, this is likely benign positional has it only happens when she is moving head side-to-side or walking. Patient is unable to reproduce vertigo here in the ED. I have very low  suspicion for vascular origin of patient's vertigo, low suspicion for central cause of her vertigo or tinnitus. This is likely related to the inner ear, she will need ENT follow-up. Patient has no neurological deficits, no active vertiginous symptoms, she will be given meclizine, instructed to follow-up with ENT for further evaluation and management, encouraged to return immediately to the emergency room if any new or worsening signs or symptoms present. Patient verbalized her understanding and agreement today's plan had no further questions or concerns at time of discharge. Patient's case was shared with Sherwood Gambler who agreed to my assessment and plan     I personally performed the services described in this documentation, which was scribed in my presence. The recorded information has been reviewed and is accurate.      Okey Regal, PA-C 02/24/16 [phone]  Sherwood Gambler, MD 02/25/16 [phone]

## 2016-02-23 NOTE — ED Notes (Signed)
PA at bedside.

## 2016-02-23 NOTE — ED Notes (Signed)
Patient able to ambulate independently  

## 2016-02-23 NOTE — ED Notes (Signed)
Patient here with left ear ringing x 4 days, denies trauma

## 2016-02-23 NOTE — Discharge Instructions (Signed)
Please follow-up with ENT specialist for further evaluation and management. If any worsening signs or symptoms present please return to the emergency room for further evaluation.

## 2016-02-25 ENCOUNTER — Telehealth: Payer: Self-pay | Admitting: Internal Medicine

## 2016-02-25 MED FILL — CHLORHEXIDINE 0.12% RINSE: 0.12 | 15 days supply | Qty: 473 | Fill #0

## 2016-02-25 MED FILL — TRAVEL SICKNESS 25 MG TAB C: 25 | 10 days supply | Qty: 15 | Fill #0

## 2016-02-25 MED FILL — TRUE METRIX TEST STRIP: 12 days supply | Qty: 50 | Fill #0

## 2016-02-25 NOTE — Telephone Encounter (Signed)
Patient would like a referral to ENT. °

## 2016-03-01 NOTE — Telephone Encounter (Signed)
Find out why she needs a referral

## 2016-03-02 ENCOUNTER — Encounter: Payer: Self-pay | Admitting: Pharmacist

## 2016-03-02 ENCOUNTER — Ambulatory Visit: Payer: No Typology Code available for payment source | Attending: Internal Medicine | Admitting: Pharmacist

## 2016-03-02 DIAGNOSIS — E118 Type 2 diabetes mellitus with unspecified complications: Secondary | ICD-10-CM

## 2016-03-02 DIAGNOSIS — Z794 Long term (current) use of insulin: Secondary | ICD-10-CM

## 2016-03-02 DIAGNOSIS — E1165 Type 2 diabetes mellitus with hyperglycemia: Secondary | ICD-10-CM

## 2016-03-02 DIAGNOSIS — IMO0002 Reserved for concepts with insufficient information to code with codable children: Secondary | ICD-10-CM

## 2016-03-02 DIAGNOSIS — I10 Essential (primary) hypertension: Secondary | ICD-10-CM

## 2016-03-02 LAB — GLUCOSE, POCT (MANUAL RESULT ENTRY): POC Glucose: 182 mg/dl — AB (ref 70–99)

## 2016-03-02 MED ORDER — LOSARTAN POTASSIUM 100 MG PO TABS: 100.0000 mg | ORAL_TABLET | Freq: Every day | ORAL | Status: DC

## 2016-03-02 MED FILL — LOSARTAN POTASSIUM 100 MG T: 100 | 30 days supply | Qty: 30 | Fill #0

## 2016-03-02 NOTE — Patient Instructions (Addendum)
Thanks for coming to see [place].  CHECK YOUR BLOOD SUGAR  Take your Novolog as follows: Check sugars before meals If sugars are  150-200 give 5 units 201-250 give 7 units 251-300, give 9 units 301-350, give 11 units  Schedule an appointment to see a new provider next week for your ears

## 2016-03-02 NOTE — Progress Notes (Signed)
S:    Patient arrives in good spirits.  Presents for diabetes management and blood pressure management at the request of Chari Manning. Patient was referred on 02/02/16.  Patient was last seen by Primary Care Provider on 02/02/16.   Patient reports adherence with medications.  Current diabetes medications include: Novolog sliding scale and Levemir 50 units BID. She has not been taking her sliding scale as prescribed as at first said she didn't take more than 5 units at a time but then said this morning that she took 10 units with breakfast.  Patient denies hypoglycemic events.  Patient reported exercise habits: none   Patient reports nocturia.  Patient reports neuropathy. Patient denies visual changes. Patient reports self foot exams.   Patient was recently seen in the ED for vertigo and ringing in her ears. She hasn't been feeling well so she has been eating differently and at different times. She also reports that she doesn't check her blood glucose as often as she should.    O:  Lab Results  Component Value Date   HGBA1C 13.1 01/12/2016   There were no vitals filed for this visit.  Home fasting CBG: 218-300 2 hour post-prandial/random CBG: 300s  POCT glucose = 182   A/P: Diabetes longstanding currently UNcontrolled based on A1c of 13.1 and home readings. Patient reports hypoglycemic events and is able to verbalize appropriate hypoglycemia management plan. Patient reports adherence with medication. Control is suboptimal due to dietary indiscretion and sedentary lifestyle.  Continued basal insulin Levemir (insulin determir) 50 units BID and continued current Novolog sliding scale regimen and instructed patient to take her Novolog as prescribed. Reviewed this with the patient and she was able to repeat the correct instructions. Instructed patient to not take her insulin if she does not eat and to not take the insulin until she knows she is going to eat a full meal. Also stressed the  importance of monitoring her blood glucose regularly.   Next A1C anticipated May 2017.    Written patient instructions provided.  Total time in face to face counseling 20 minutes.  Follow up in Pharmacist Clinic Visit in 2 weeks for CBG log review. Patient seen with Jamie Brookes, PharmD Candidate

## 2016-03-28 MED FILL — $Levemir Flexpen 100u/pen: 100 | 30 days supply | Qty: 30 | Fill #1

## 2016-03-28 MED FILL — ULTICARE PEN NDL 4MM 32G: 32G X 4 MM | 25 days supply | Qty: 100 | Fill #1

## 2016-03-28 MED FILL — $novoLOG FLEXPEN SYRINGE: 100 | 30 days supply | Qty: 15 | Fill #2

## 2016-05-08 ENCOUNTER — Telehealth: Payer: Self-pay | Admitting: Internal Medicine

## 2016-05-08 DIAGNOSIS — E089 Diabetes mellitus due to underlying condition without complications: Secondary | ICD-10-CM

## 2016-05-08 MED ORDER — GABAPENTIN 300 MG PO CAPS: 300.0000 mg | ORAL_CAPSULE | Freq: Three times a day (TID) | ORAL | Status: DC

## 2016-05-08 NOTE — Telephone Encounter (Signed)
Medication was refilled and sent to [facility].

## 2016-05-08 NOTE — Telephone Encounter (Signed)
Pt. Called requesting a refill on gabapentin (NEURONTIN) 300 MG capsule. Pt. States that she receives her medication from the [facility] program.  She would like her Rx faxed to [phone]. Please f/u with pt.

## 2016-05-17 ENCOUNTER — Ambulatory Visit: Payer: No Typology Code available for payment source | Admitting: Podiatry

## 2016-05-17 ENCOUNTER — Encounter: Payer: Self-pay | Admitting: Podiatry

## 2016-05-17 DIAGNOSIS — M79674 Pain in right toe(s): Secondary | ICD-10-CM

## 2016-05-17 DIAGNOSIS — M79675 Pain in left toe(s): Secondary | ICD-10-CM

## 2016-05-17 DIAGNOSIS — B351 Tinea unguium: Secondary | ICD-10-CM

## 2016-05-17 NOTE — Patient Instructions (Signed)
Diabetes and Foot Care Diabetes may cause you to have problems because of poor blood supply (circulation) to your feet and legs. This may cause the skin on your feet to become thinner, break easier, and heal more slowly. Your skin may become dry, and the skin may peel and crack. You may also have nerve damage in your legs and feet causing decreased feeling in them. You may not notice minor injuries to your feet that could lead to infections or more serious problems. Taking care of your feet is one of the most important things you can do for yourself.  HOME CARE INSTRUCTIONS  Wear shoes at all times, even in the house. Do not go barefoot. Bare feet are easily injured.  Check your feet daily for blisters, cuts, and redness. If you cannot see the bottom of your feet, use a mirror or ask someone for help.  Wash your feet with warm water (do not use hot water) and mild soap. Then pat your feet and the areas between your toes until they are completely dry. Do not soak your feet as this can dry your skin.  Apply a moisturizing lotion or petroleum jelly (that does not contain alcohol and is unscented) to the skin on your feet and to dry, brittle toenails. Do not apply lotion between your toes.  Trim your toenails straight across. Do not dig under them or around the cuticle. File the edges of your nails with an emery board or nail file.  Do not cut corns or calluses or try to remove them with medicine.  Wear clean socks or stockings every day. Make sure they are not too tight. Do not wear knee-high stockings since they may decrease blood flow to your legs.  Wear shoes that fit properly and have enough cushioning. To break in new shoes, wear them for just a few hours a day. This prevents you from injuring your feet. Always look in your shoes before you put them on to be sure there are no objects inside.  Do not cross your legs. This may decrease the blood flow to your feet.  If you find a minor scrape,  cut, or break in the skin on your feet, keep it and the skin around it clean and dry. These areas may be cleansed with mild soap and water. Do not cleanse the area with peroxide, alcohol, or iodine.  When you remove an adhesive bandage, be sure not to damage the skin around it.  If you have a wound, look at it several times a day to make sure it is healing.  Do not use heating pads or hot water bottles. They may burn your skin. If you have lost feeling in your feet or legs, you may not know it is happening until it is too late.  Make sure your health care provider performs a complete foot exam at least annually or more often if you have foot problems. Report any cuts, sores, or bruises to your health care provider immediately. SEEK MEDICAL CARE IF:   You have an injury that is not healing.  You have cuts or breaks in the skin.  You have an ingrown nail.  You notice redness on your legs or feet.  You feel burning or tingling in your legs or feet.  You have pain or cramps in your legs and feet.  Your legs or feet are numb.  Your feet always feel cold. SEEK IMMEDIATE MEDICAL CARE IF:   There is increasing redness,   swelling, or pain in or around a wound.  There is a red line that goes up your leg.  Pus is coming from a wound.  You develop a fever or as directed by your health care provider.  You notice a bad smell coming from an ulcer or wound.   This information is not intended to replace advice given to you by your health care provider. Make sure you discuss any questions you have with your health care provider.   Document Released: 11/03/2000 Document Revised: 07/09/2013 Document Reviewed: 04/15/2013 Elsevier Interactive Patient Education 2016 Elsevier Inc.  

## 2016-05-18 NOTE — Progress Notes (Signed)
Patient ID: Katherine [name], female   DOB: 1956-07-21, 60 y.o.   MRN: [id number]  Subjective: This patient presents today complaining of toenails that are uncomfortable walking wearing shoes and requests toenail debridement. Patient was last evaluated in our office on 01/05/2014 for similar complaint.  Patient is type II diabetic and denies any history of ulceration, claudication or amputation  Vascular: Nonpitting edema bilaterally No calf pain or calf tenderness bilaterally DP pulses 2/4 bilaterally PT pulses 1/4 bilaterally Capillary reflex immediate bilaterally  Neurological: Sensation to 10 g monofilament wire intact 3/5 right 2/5 left Vibratory sensation reactive bilaterally Ankle reflexes equal reactive bilaterally  Dermatological No open skin lesions bilaterally The toenails are extremely hypertrophic, elongated, deformed, discolored and tender draped palpation 6-10  Musculoskeletal: Pes planus bilaterally HAV deformity left There is no restriction ankle, subtalar, midtarsal joints bilaterally  Assessment: Decrease pedal pulses bilaterally Decreased protective sensation bilaterally Type II diabetic Neglected symptomatic onychomycoses 6-10  Plan: Today review the results of exam with patient today and recommended return intervals at 3 months and offered her debridement of toenails and she verbally consents The toenails 6-10 were debrided mechanically and electrically without a bleeding  Reappoint 3

## 2016-06-05 MED FILL — $Levemir Flexpen 100u/pen: 100 | 30 days supply | Qty: 30 | Fill #2

## 2016-06-09 MED FILL — ULTICARE PEN NDL 4MM 32G: 32G X 4 MM | 25 days supply | Qty: 100 | Fill #2

## 2016-06-25 ENCOUNTER — Emergency Department (HOSPITAL_COMMUNITY): Payer: Self-pay

## 2016-06-25 ENCOUNTER — Encounter (HOSPITAL_COMMUNITY): Payer: Self-pay

## 2016-06-25 ENCOUNTER — Emergency Department (HOSPITAL_COMMUNITY)
Admission: EM | Admit: 2016-06-25 | Discharge: 2016-06-26 | Disposition: A | Discharge status: Home / Self Care | Payer: Self-pay | Attending: Emergency Medicine | Admitting: Emergency Medicine

## 2016-06-25 DIAGNOSIS — Z794 Long term (current) use of insulin: Secondary | ICD-10-CM | POA: Insufficient documentation

## 2016-06-25 DIAGNOSIS — R739 Hyperglycemia, unspecified: Secondary | ICD-10-CM

## 2016-06-25 DIAGNOSIS — E1165 Type 2 diabetes mellitus with hyperglycemia: Secondary | ICD-10-CM | POA: Insufficient documentation

## 2016-06-25 DIAGNOSIS — M79671 Pain in right foot: Secondary | ICD-10-CM

## 2016-06-25 DIAGNOSIS — N390 Urinary tract infection, site not specified: Secondary | ICD-10-CM | POA: Insufficient documentation

## 2016-06-25 DIAGNOSIS — M79672 Pain in left foot: Secondary | ICD-10-CM

## 2016-06-25 DIAGNOSIS — H538 Other visual disturbances: Secondary | ICD-10-CM | POA: Insufficient documentation

## 2016-06-25 DIAGNOSIS — Z8639 Personal history of other endocrine, nutritional and metabolic disease: Secondary | ICD-10-CM

## 2016-06-25 DIAGNOSIS — M25572 Pain in left ankle and joints of left foot: Secondary | ICD-10-CM | POA: Insufficient documentation

## 2016-06-25 DIAGNOSIS — E1143 Type 2 diabetes mellitus with diabetic autonomic (poly)neuropathy: Secondary | ICD-10-CM | POA: Insufficient documentation

## 2016-06-25 DIAGNOSIS — M25571 Pain in right ankle and joints of right foot: Secondary | ICD-10-CM | POA: Insufficient documentation

## 2016-06-25 DIAGNOSIS — I10 Essential (primary) hypertension: Secondary | ICD-10-CM | POA: Insufficient documentation

## 2016-06-25 DIAGNOSIS — Z79899 Other long term (current) drug therapy: Secondary | ICD-10-CM | POA: Insufficient documentation

## 2016-06-25 LAB — URINE MICROSCOPIC-ADD ON: WBC UA: NONE SEEN WBC/hpf (ref 0–5)

## 2016-06-25 LAB — URINALYSIS, ROUTINE W REFLEX MICROSCOPIC
Bilirubin Urine: NEGATIVE
Ketones, ur: NEGATIVE mg/dL
LEUKOCYTES UA: NEGATIVE
Nitrite: POSITIVE — AB
PH: 6 (ref 5.0–8.0)
Protein, ur: NEGATIVE mg/dL
Specific Gravity, Urine: 1.024 (ref 1.005–1.030)

## 2016-06-25 LAB — CBC
HEMATOCRIT: 44.4 % (ref 36.0–46.0)
Hemoglobin: 14.5 g/dL (ref 12.0–15.0)
MCH: 28.2 pg (ref 26.0–34.0)
MCHC: 32.7 g/dL (ref 30.0–36.0)
MCV: 86.2 fL (ref 78.0–100.0)
Platelets: 231 10*3/uL (ref 150–400)
RBC: 5.15 MIL/uL — AB (ref 3.87–5.11)
RDW: 13.2 % (ref 11.5–15.5)
WBC: 6.9 10*3/uL (ref 4.0–10.5)

## 2016-06-25 LAB — BASIC METABOLIC PANEL
Anion gap: 9 (ref 5–15)
BUN: 11 mg/dL (ref 6–20)
CHLORIDE: 97 mmol/L — AB (ref 101–111)
CO2: 27 mmol/L (ref 22–32)
Calcium: 9 mg/dL (ref 8.9–10.3)
Creatinine, Ser: 0.91 mg/dL (ref 0.44–1.00)
GFR calc non Af Amer: >60 mL/min (ref >60)
Glucose, Bld: 513 mg/dL (ref 65–99)
POTASSIUM: 3.8 mmol/L (ref 3.5–5.1)
SODIUM: 133 mmol/L — AB (ref 135–145)

## 2016-06-25 LAB — CBG MONITORING, ED: Glucose-Capillary: 326 mg/dL — ABNORMAL HIGH (ref 65–99)

## 2016-06-25 MED ORDER — INSULIN ASPART 100 UNIT/ML ~~LOC~~ SOLN
5.0000 [IU] | Freq: Once | SUBCUTANEOUS | Status: AC
  Administered 2016-06-25: 5 [IU] via INTRAVENOUS
  Filled 2016-06-25: qty 1

## 2016-06-25 MED ORDER — INSULIN ASPART 100 UNIT/ML ~~LOC~~ SOLN
10.0000 [IU] | Freq: Once | SUBCUTANEOUS | Status: AC
  Administered 2016-06-25: 10 [IU] via INTRAVENOUS
  Filled 2016-06-25: qty 1

## 2016-06-25 MED ORDER — SODIUM CHLORIDE 0.9 % IV BOLUS (SEPSIS)
1000.0000 mL | Freq: Once | INTRAVENOUS | Status: AC
  Administered 2016-06-25: 1000 mL via INTRAVENOUS

## 2016-06-25 MED ORDER — CEPHALEXIN 250 MG PO CAPS: 250.0000 mg | ORAL_CAPSULE | Freq: Four times a day (QID) | ORAL | 0 refills | Status: DC

## 2016-06-25 MED ORDER — DEXTROSE 5 % IV SOLN
1.0000 g | Freq: Once | INTRAVENOUS | Status: AC
  Administered 2016-06-26: 1 g via INTRAVENOUS
  Filled 2016-06-25: qty 10

## 2016-06-25 MED ORDER — OXYCODONE-ACETAMINOPHEN 5-325 MG PO TABS: 1.0000 | ORAL_TABLET | Freq: Four times a day (QID) | ORAL | 0 refills | Status: DC | PRN

## 2016-06-25 NOTE — ED Notes (Signed)
Pt returned from CT at this time. Pt in NAD at this time.  Will continue to closely monitor pt.

## 2016-06-25 NOTE — Discharge Instructions (Signed)
Keep a record of your blood sugars and take this to your primary Dr. to discuss whether or not you need to make medication adjustments.  Return to the ER symptoms significantly worsen or change.

## 2016-06-25 NOTE — ED Notes (Signed)
Patient transported to CT at this time.  Pt's IVF infusing without difficulty at this time.  Pt in no apparent distress at this time.

## 2016-06-25 NOTE — ED Triage Notes (Signed)
Pt states she believes her BS is up due to blurred vision and when she took BS this am it was 370; pt states that is high for her; pt states Md advise to come to ED if BS is high; Pt a&o x 4 on arrival. Pt denies pain at triage;

## 2016-06-25 NOTE — ED Provider Notes (Signed)
[place] DEPT Provider Note   CSN: [id number] Arrival date & time: 06/25/16  1958  First Provider Contact:  None       History   Chief Complaint Chief Complaint  Patient presents with  . Hyperglycemia  . Blurred Vision    HPI Katherine [name] is a 60 y.o. female.  Patient is a 60 year old female with history of diabetes. She presents for evaluation of elevated blood sugar and blurry vision for the past 2 days. She denies any fevers or chills per denies any vomiting or diarrhea. She does report a stressful situation in which one of her relatives is critically ill and on "life support" upstairs. She has been quite stressed out by this and has not been eating or drinking as she normally does.   The history is provided by the patient.  Hyperglycemia  Blood sugar level PTA:  500 Severity:  Moderate Onset quality:  Gradual Duration:  2 days Timing:  Constant Progression:  Worsening Chronicity:  New Diabetes status:  Controlled with insulin Relieved by:  Nothing Ineffective treatments:  None tried Associated symptoms: polyuria   Associated symptoms: no abdominal pain, no dizziness, no dysuria and no shortness of breath     Past Medical History:  Diagnosis Date  . Boil of buttock   . Diabetes mellitus   . Hypertension     Patient Active Problem List   Diagnosis Date Noted  . DKA (diabetic ketoacidoses) ([place]) 12/26/2014  . UTI (lower urinary tract infection) 12/26/2014  . Cellulitis of great toe of right foot 06/22/2014  . Cellulitis 06/22/2014  . Cellulitis of great toe of left foot 06/22/2014  . DEPRESSION 09/10/2009  . HYPERCHOLESTEROLEMIA 05/19/2009  . LEG CRAMPS 05/19/2009  . GERD 02/15/2009  . Gastroparesis 09/03/2008  . ONYCHOMYCOSIS 07/14/2008  . DENTAL CARIES 07/14/2008  . Diabetes mellitus type 2 with complications, uncontrolled ([place]) 09/16/2003  . HYPERTENSION, BENIGN ESSENTIAL 09/16/2003    Past Surgical History:  Procedure Laterality Date  .  APPENDECTOMY      OB History    No data available       Home Medications    Prior to Admission medications   Medication Sig Start Date End Date Taking? Authorizing Provider  amLODipine (NORVASC) 10 MG tablet Take 1 tablet (10 mg total) by mouth daily. 02/17/16   Lance Bosch, NP  Blood Glucose Monitoring Suppl (TRUE METRIX METER) W/DEVICE KIT USE AS INSTRUCTED 10/22/15   Lance Bosch, NP  chlorhexidine (PERIDEX) 0.12 % solution Use as directed 15 mLs in the mouth or throat 2 (two) times daily. 01/12/16   Lance Bosch, NP  gabapentin (NEURONTIN) 300 MG capsule Take 1 capsule (300 mg total) by mouth 3 (three) times daily. 05/08/16   Tresa Garter, MD  glucose blood (TRUE METRIX BLOOD GLUCOSE TEST) test strip Use as instructed 10/22/15   Lance Bosch, NP  hydrALAZINE (APRESOLINE) 10 MG tablet Take 1 tablet (10 mg total) by mouth 3 (three) times daily. 02/17/16   Lance Bosch, NP  insulin aspart (NOVOLOG) 100 UNIT/ML injection Check sugars before meals If sugars are 150-200 give 5 units,201-250 give 7 units,251-300, give 9 units,301-350, give 11 units 02/17/16   Lance Bosch, NP  insulin detemir (LEVEMIR) 100 UNIT/ML injection Inject 0.5 mLs (50 Units total) into the skin 2 (two) times daily. 11/04/15   Tresa Garter, MD  Insulin Pen Needle (ULTICARE MICRO PEN NEEDLES) 32G X 4 MM MISC USE AS DIRECTED 02/09/16  Lance Bosch, NP  Insulin Syringe-Needle U-100 28G X 1/2" 0.3 ML MISC Check blood sugar TID & QHS 04/08/15   Lorayne Marek, MD  losartan (COZAAR) 100 MG tablet Take 1 tablet (100 mg total) by mouth daily. 03/02/16   Tresa Garter, MD  meclizine (ANTIVERT) 12.5 MG tablet Take 1 tablet (12.5 mg total) by mouth 3 (three) times daily as needed for dizziness. 02/23/16   Okey Regal, PA-C  Multiple Vitamin (MULTIVITAMIN WITH MINERALS) TABS tablet Take 1 tablet by mouth daily.    Historical Provider, MD    Family History Family History  Problem Relation Age of Onset    . Diabetes Father   . Hypertension Father   . Kidney disease Mother   . Hypertension Mother   . Cancer Maternal Grandmother   . Hypertension Maternal Grandfather     Social History Social History  Substance Use Topics  . Smoking status: Never Smoker  . Smokeless tobacco: Not on file  . Alcohol use No     Allergies   Other   Review of Systems Review of Systems  Respiratory: Negative for shortness of breath.   Gastrointestinal: Negative for abdominal pain.  Endocrine: Positive for polyuria.  Genitourinary: Negative for dysuria.  Neurological: Negative for dizziness.  All other systems reviewed and are negative.    Physical Exam Updated Vital Signs BP (!) 224/92 (BP Location: Right Arm)   Pulse 88   Temp 98.2 F (36.8 C) (Oral)   Resp 20   SpO2 97%   Physical Exam  Constitutional: She is oriented to person, place, and time. She appears well-developed and well-nourished. No distress.  HENT:  Head: Normocephalic and atraumatic.  Neck: Normal range of motion. Neck supple.  Cardiovascular: Normal rate and regular rhythm.  Exam reveals no gallop and no friction rub.   No murmur heard. Pulmonary/Chest: Effort normal and breath sounds normal. No respiratory distress. She has no wheezes.  Abdominal: Soft. Bowel sounds are normal. She exhibits no distension. There is no tenderness.  Musculoskeletal: Normal range of motion.  Neurological: She is alert and oriented to person, place, and time.  Skin: Skin is warm and dry. She is not diaphoretic.  Nursing note and vitals reviewed.    ED Treatments / Results  Labs (all labs ordered are listed, but only abnormal results are displayed) Labs Reviewed  BASIC METABOLIC PANEL - Abnormal; Notable for the following:       Result Value   Sodium 133 (*)    Chloride 97 (*)    Glucose, Bld 513 (*)    All other components within normal limits  CBC - Abnormal; Notable for the following:    RBC 5.15 (*)    All other components  within normal limits  URINALYSIS, ROUTINE W REFLEX MICROSCOPIC (NOT AT [facility]) - Abnormal; Notable for the following:    Glucose, UA >1000 (*)    Hgb urine dipstick MODERATE (*)    Nitrite POSITIVE (*)    All other components within normal limits  URINE MICROSCOPIC-ADD ON - Abnormal; Notable for the following:    Squamous Epithelial / LPF 0-5 (*)    Bacteria, UA MANY (*)    All other components within normal limits  CBG MONITORING, ED    EKG  EKG Interpretation None       Radiology No results found.  Procedures Procedures (including critical care time)  Medications Ordered in ED Medications  sodium chloride 0.9 % bolus 1,000 mL (not administered)  insulin  aspart (novoLOG) injection 10 Units (not administered)     Initial Impression / Assessment and Plan / ED Course  I have reviewed the triage vital signs and the nursing notes.  Pertinent labs & imaging results that were available during my care of the patient were reviewed by me and considered in my medical decision making (see chart for details).  Clinical Course    Final Clinical Impressions(s) / ED Diagnoses   Final diagnoses:  None   Patient presents with complaints of elevated blood sugar and blurry vision. Her blood sugar here was found to be well over 500, however no evidence for DKA. She is given a liter of normal saline along with intravenous insulin. Her sugar has improved somewhat, however does remain high. She will be given an additional bolus of fluid and insulin and her sugar will be rechecked. If it continues to drop, she should be appropriate for discharge.  Also, due to her blurry vision a head CT will be obtained to rule out the possibility of a stroke. This is pending at this time.  Care will be signed out to Dr. Leonides Schanz at shift change. She will obtain the results of the CT scan, recheck the blood sugar, and determine the final disposition.  New Prescriptions New Prescriptions   No medications on  file     Veryl Speak, MD 06/25/16 2328

## 2016-06-26 LAB — CBG MONITORING, ED: GLUCOSE-CAPILLARY: 258 mg/dL — AB (ref 65–99)

## 2016-06-26 MED ORDER — SODIUM CHLORIDE 0.9 % IV BOLUS (SEPSIS)
1000.0000 mL | Freq: Once | INTRAVENOUS | Status: AC
Start: 2016-06-26 — End: 2016-06-26
  Administered 2016-06-26: 1000 mL via INTRAVENOUS

## 2016-06-26 NOTE — ED Notes (Signed)
Pt provided with 240 mL of water at this time. Pt tolerated PO fluids without difficulty at this time.  MD notified.

## 2016-06-26 NOTE — ED Provider Notes (Signed)
12:00 AM  Assumed care from Dr. Stark Jock.  Pt is a 60 y.o. female with history of diabetes who presents emergency department for hyperglycemia, blurry vision for the past 2 days. Blood glucose improving with insulin, IV fluids. She reports her blurry vision has almost resolved as well. Not in DKA. She does appear to have a UTI. Urine culture pending. We'll give Keflex to go home with a dose of ceftriaxone the emergency department. Neuro exam is nonfocal but given vision changes, head CT obtained. Results pending.   1:20 AM  Pt's blood glucose is 258. I feel she is safe to be discharged. Discharge paperwork or a written by Dr. Stark Jock. Discussed return precautions with patient and her husband.  She has a PCP for follow-up. Recommended she take her diabetes medications as prescribed. Discussed diet recommendations. Patient and husband verbalize understanding and are comfortable with this plan.   [place], DO 06/26/16 [phone]

## 2016-06-27 LAB — URINE CULTURE

## 2016-07-04 MED FILL — $Levemir Flexpen 100u/pen: 100 | 30 days supply | Qty: 30 | Fill #3

## 2016-07-25 ENCOUNTER — Encounter: Payer: Self-pay | Admitting: Internal Medicine

## 2016-07-25 ENCOUNTER — Ambulatory Visit: Payer: Self-pay | Attending: Internal Medicine | Admitting: Internal Medicine

## 2016-07-25 VITALS — BP 174/82 | HR 73 | Temp 98.2°F | Resp 16 | Wt 219.2 lb

## 2016-07-25 DIAGNOSIS — E1143 Type 2 diabetes mellitus with diabetic autonomic (poly)neuropathy: Secondary | ICD-10-CM | POA: Insufficient documentation

## 2016-07-25 DIAGNOSIS — H6121 Impacted cerumen, right ear: Secondary | ICD-10-CM

## 2016-07-25 DIAGNOSIS — Z888 Allergy status to other drugs, medicaments and biological substances status: Secondary | ICD-10-CM | POA: Insufficient documentation

## 2016-07-25 DIAGNOSIS — E118 Type 2 diabetes mellitus with unspecified complications: Secondary | ICD-10-CM

## 2016-07-25 DIAGNOSIS — K3184 Gastroparesis: Secondary | ICD-10-CM

## 2016-07-25 DIAGNOSIS — Z1159 Encounter for screening for other viral diseases: Secondary | ICD-10-CM

## 2016-07-25 DIAGNOSIS — E7800 Pure hypercholesterolemia, unspecified: Secondary | ICD-10-CM

## 2016-07-25 DIAGNOSIS — I10 Essential (primary) hypertension: Secondary | ICD-10-CM

## 2016-07-25 DIAGNOSIS — Z79899 Other long term (current) drug therapy: Secondary | ICD-10-CM | POA: Insufficient documentation

## 2016-07-25 DIAGNOSIS — K219 Gastro-esophageal reflux disease without esophagitis: Secondary | ICD-10-CM

## 2016-07-25 DIAGNOSIS — IMO0002 Reserved for concepts with insufficient information to code with codable children: Secondary | ICD-10-CM

## 2016-07-25 DIAGNOSIS — E1165 Type 2 diabetes mellitus with hyperglycemia: Secondary | ICD-10-CM

## 2016-07-25 DIAGNOSIS — Z114 Encounter for screening for human immunodeficiency virus [HIV]: Secondary | ICD-10-CM

## 2016-07-25 DIAGNOSIS — Z794 Long term (current) use of insulin: Secondary | ICD-10-CM

## 2016-07-25 LAB — GLUCOSE, POCT (MANUAL RESULT ENTRY)
POC GLUCOSE: 271 mg/dL — AB (ref 70–99)
POC GLUCOSE: 226 mg/dL — AB (ref 70–99)

## 2016-07-25 LAB — POCT GLYCOSYLATED HEMOGLOBIN (HGB A1C): Hemoglobin A1C: 12.4

## 2016-07-25 MED ORDER — HYDRALAZINE HCL 25 MG PO TABS: 25.0000 mg | ORAL_TABLET | Freq: Three times a day (TID) | ORAL | 2 refills | Status: DC

## 2016-07-25 MED ORDER — LOSARTAN POTASSIUM 100 MG PO TABS: 100.0000 mg | ORAL_TABLET | Freq: Every day | ORAL | 3 refills | Status: DC

## 2016-07-25 MED ORDER — CARBAMIDE PEROXIDE 6.5 % OT SOLN: 5.0000 [drp] | Freq: Two times a day (BID) | OTIC | 0 refills | Status: DC

## 2016-07-25 MED ORDER — AMLODIPINE BESYLATE 10 MG PO TABS: 10.0000 mg | ORAL_TABLET | Freq: Every day | ORAL | 3 refills | Status: DC

## 2016-07-25 MED ORDER — INSULIN DETEMIR 100 UNIT/ML FLEXPEN: 75.0000 [IU] | PEN_INJECTOR | Freq: Two times a day (BID) | SUBCUTANEOUS | 8 refills | Status: DC

## 2016-07-25 MED ORDER — INSULIN ASPART 100 UNIT/ML FLEXPEN: 15.0000 [IU] | PEN_INJECTOR | Freq: Three times a day (TID) | SUBCUTANEOUS | 9 refills | Status: DC

## 2016-07-25 MED FILL — ?LOSARTAN POTASSIUM 100 MG: 100 | 30 days supply | Qty: 30 | Fill #0

## 2016-07-25 MED FILL — hydrALAZINE HCL 25 MG TABS: 25 | 30 days supply | Qty: 90 | Fill #0

## 2016-07-25 MED FILL — AMLODIPINE BESYLATE 10 MG T: 10 | 30 days supply | Qty: 30 | Fill #0

## 2016-07-25 NOTE — Progress Notes (Signed)
Katherine [name], is a 60 y.o. female  [id number]  [id number]  DOB - [birth year]  CC:  Chief Complaint  Patient presents with  . Establish Care  . Diabetes       HPI: Katherine [name] is a 60 y.o. female here today to establish medical care, last seen in clinic 3/17, w/ hx of ooc dm2, htn, gastroparesis, hld, gerd. Per pt, taking all her meds, but having problems with sweets and exercising.  Per pt, she is taking levemir 60bid, and novolog 10 units q/ meals. She states she usually eats breakfast, but not so consistent w/ lunch dinner. Does not add on salt, but mentions eating canned peaches and sausages this am.  Last seen in ED on 06/25/16 for blurry vision and hyperglycemia.    Pt has not seen opthalmology for years, complains of waxing/waning blurry vision.    +gastroparesis persist, food does not sit well w/ her. Denies diarrhea. No n/v currently, but does get intermittent nausea.  Patient has No headache, No chest pain, No abdominal pain - No Nausea, No new weakness tingling or numbness, No Cough - SOB.    Review of Systems: Per hpi, o/w all systems reviewed and negative.   Allergies  Allergen Reactions  . Other Swelling    Seaweed= swelling on arms, hands and face   Past Medical History:  Diagnosis Date  . Boil of buttock   . Diabetes mellitus   . Hypertension    Current Outpatient Prescriptions on File Prior to Visit  Medication Sig Dispense Refill  . Blood Glucose Monitoring Suppl (TRUE METRIX METER) W/DEVICE KIT USE AS INSTRUCTED 1 kit 0  . gabapentin (NEURONTIN) 300 MG capsule Take 1 capsule (300 mg total) by mouth 3 (three) times daily. (Patient taking differently: Take 300 mg by mouth 4 (four) times daily. ) 60 capsule 2  . glucose blood (TRUE METRIX BLOOD GLUCOSE TEST) test strip Use as instructed 100 each 12  . Insulin Pen Needle (ULTICARE MICRO PEN NEEDLES) 32G X 4 MM MISC USE AS DIRECTED 100 each 12  . Insulin Syringe-Needle U-100 28G X 1/2" 0.3 ML MISC  Check blood sugar TID & QHS 100 each 5  . OVER THE COUNTER MEDICATION Take 1 tablet by mouth daily. Over the counter supplement with iron and other ingredients    . Pyridoxine HCl (VITAMIN B-6 PO) Take 1 tablet by mouth daily.    . cephALEXin (KEFLEX) 250 MG capsule Take 1 capsule (250 mg total) by mouth 4 (four) times daily. (Patient not taking: Reported on 07/25/2016) 28 capsule 0  . chlorhexidine (PERIDEX) 0.12 % solution Use as directed 15 mLs in the mouth or throat 2 (two) times daily. (Patient not taking: Reported on 07/25/2016) 120 mL 0  . meclizine (ANTIVERT) 12.5 MG tablet Take 1 tablet (12.5 mg total) by mouth 3 (three) times daily as needed for dizziness. (Patient not taking: Reported on 06/25/2016) 30 tablet 0  . oxyCODONE-acetaminophen (PERCOCET) 5-325 MG tablet Take 1-2 tablets by mouth every 6 (six) hours as needed. (Patient not taking: Reported on 07/25/2016) 15 tablet 0  . [DISCONTINUED] sitaGLIPtin (JANUVIA) 100 MG tablet Take 100 mg by mouth daily.     No current facility-administered medications on file prior to visit.    Family History  Problem Relation Age of Onset  . Diabetes Father   . Hypertension Father   . Kidney disease Mother   . Hypertension Mother   . Cancer Maternal Grandmother   . Hypertension Maternal Grandfather  Social History   Social History  . Marital status: Married    Spouse name: N/A  . Number of children: N/A  . Years of education: N/A   Occupational History  . Not on file.   Social History Main Topics  . Smoking status: Never Smoker  . Smokeless tobacco: Not on file  . Alcohol use No  . Drug use: No  . Sexual activity: Not on file   Other Topics Concern  . Not on file   Social History Narrative  . No narrative on file    Objective:   Vitals:   07/25/16 1147  BP: (!) 174/82  Pulse: 73  Resp: 16  Temp: 98.2 F (36.8 C)    Filed Weights   07/25/16 1147  Weight: 219 lb 3.2 oz (99.4 kg)    BP Readings from Last 3  Encounters:  07/25/16 (!) 174/82  06/26/16 192/93  02/23/16 179/89    Physical Exam: Constitutional: Patient appears well-developed and well-nourished. No distress. AAOx3, obese, pleasant HENT: Normocephalic, atraumatic, External right and left ear normal. Oropharynx is clear and moist.  Right ear w/ ceruminosis, left TM clear. Eyes: Conjunctivae and EOM are normal. PERRL, no scleral icterus. Neck: Normal ROM. Neck supple. No JVD.  CVS: RRR, S1/S2 +, no murmurs, no gallops, no carotid bruit.  Pulmonary: Effort and breath sounds normal, no stridor, rhonchi, wheezes, rales.  Abdominal: Soft. BS +, obese, no distension, tenderness, rebound or guarding.  Musculoskeletal: Normal range of motion. No edema and no tenderness.  Foot exam: bilateral peripheral pulses 2+ (dorsalis pedis and post tibialis pulses), no ulcers noted/no ecchymosis, warm to touch, monofilament testing 3/3 bilat. Sensation intact.  No c/c. Trace pedal edema bilat. Neuro: Alert.  muscle tone coordination wnl. No cranial nerve deficit grossly. Skin: Skin is warm and dry. No rash noted. Not diaphoretic. No erythema. No pallor. Psychiatric: Normal mood and affect. Behavior, judgment, thought content normal.  Lab Results  Component Value Date   WBC 6.9 06/25/2016   HGB 14.5 06/25/2016   HCT 44.4 06/25/2016   MCV 86.2 06/25/2016   PLT 231 06/25/2016   Lab Results  Component Value Date   CREATININE 0.91 06/25/2016   BUN 11 06/25/2016   NA 133 (L) 06/25/2016   K 3.8 06/25/2016   CL 97 (L) 06/25/2016   CO2 27 06/25/2016    Lab Results  Component Value Date   HGBA1C 13.1 01/12/2016   Lipid Panel     Component Value Date/Time   CHOL 173 12/05/2013 1133   TRIG 166 (H) 12/05/2013 1133   HDL 49 12/05/2013 1133   CHOLHDL 3.5 12/05/2013 1133   VLDL 33 12/05/2013 1133   LDLCALC 91 12/05/2013 1133       Depression screen PHQ 2/9 07/25/2016 02/02/2016  Decreased Interest 2 0  Down, Depressed, Hopeless 2 0  PHQ - 2  Score 4 0  Altered sleeping 1 -  Tired, decreased energy 3 -  Change in appetite 2 -  Feeling bad or failure about yourself  1 -  Trouble concentrating 1 -  Moving slowly or fidgety/restless 0 -  Suicidal thoughts 0 -  PHQ-9 Score 12 -    Assessment and plan:   1. HYPERTENSION, BENIGN ESSENTIAL Uncontrolled, dash/low salt diet recd. - increase hydralazine from 10 to 25tid - continue norvasc 10 and losartan 100 qd, consider added hctz to regimen next time if still uncontrolled. - hydrALAZINE (APRESOLINE) 25 MG tablet; Take 1 tablet (25 mg total) by  mouth 3 (three) times daily.  Dispense: 180 tablet; Refill: 2 - losartan (COZAAR) 100 MG tablet; Take 1 tablet (100 mg total) by mouth daily.  Dispense: 30 tablet; Refill: 3 - amLODipine (NORVASC) 10 MG tablet; Take 1 tablet (10 mg total) by mouth daily.  Dispense: 30 tablet; Refill: 3   2. Uncontrolled type 2 diabetes mellitus with complication, with long-term current use of insulin ([place]) - discussed better carb control, diet recds. - recd eye exam, currently working on [facility] card/financial assistance, when gets, ref to [employer]. - Glucose (CBG) 271 --> repeat 226 - HgB A1c 12 (bit better than feb 13) - Microalbumin/Creatinine Ratio, Urine - recd pt takes novolog 20units prior to lunch now. - fu [facility] 2 wk for dm chk  3. Gastroesophageal reflux disease without esophagitis controlled  4. Gastroparesis 2nd to dm, recd tighter dm control  5. HYPERCHOLESTEROLEMIA Not fasting, cannot chk lipids today, would recd statin given dm, will discuss more w/ next visit.  6. Need for hepatitis C screening test - Hepatitis C antibody  8. Encounter for screening for HIV - HIV antibody (with reflex)  9. Ceruminosis, right Debrox gtt  10. Declined flu vac   Return in about 8 weeks (around 09/19/2016) for dm /htn chk.  The patient was given clear instructions to go to ER or return to medical center if symptoms don't improve, worsen or  new problems develop. The patient verbalized understanding. The patient was told to call to get lab results if they haven't heard anything in the next week.    This note has been created with Surveyor, quantity. Any transcriptional errors are unintentional.   Maren Reamer, MD, [place], [place]   07/25/2016, 12:20 PM

## 2016-07-25 NOTE — Patient Instructions (Signed)
- [name] 2wk for dm chk  - Aim for 30 minutes of exercise most days. Rethink what you drink. Water is great! Aim for 2-3 Carb Choices per meal (30-45 grams) +/- 1 either way.  Aim for 0-15 Carbs per snack if hungry.  Include protein in moderation with your meals and snacks.  Consider reading food labels for Total Carbohydrate and Fat Grams of foods  Consider checking blood glucose (accuchecks/BG) at alternate times per day.  Continue taking medication as directed. Be mindful about how much sugar you are adding to beverages and other foods. Fruit Punch - find one with no sugar  Measure and decrease portions of carbohydrate foods. Make your plate and don't go back for seconds.  -  Diabetes Mellitus and Food It is important for you to manage your blood sugar (glucose) level. Your blood glucose level can be greatly affected by what you eat. Eating healthier foods in the appropriate amounts throughout the day at about the same time each day will help you control your blood glucose level. It can also help slow or prevent worsening of your diabetes mellitus. Healthy eating may even help you improve the level of your blood pressure and reach or maintain a healthy weight.  General recommendations for healthful eating and cooking habits include:  Eating meals and snacks regularly. Avoid going long periods of time without eating to lose weight.  Eating a diet that consists mainly of plant-based foods, such as fruits, vegetables, nuts, legumes, and whole grains.  Using low-heat cooking methods, such as baking, instead of high-heat cooking methods, such as deep frying. Work with your dietitian to make sure you understand how to use the Nutrition Facts information on food labels. HOW CAN FOOD AFFECT ME? Carbohydrates Carbohydrates affect your blood glucose level more than any other type of food. Your dietitian will help you determine how many carbohydrates to eat at each meal and teach you  how to count carbohydrates. Counting carbohydrates is important to keep your blood glucose at a healthy level, especially if you are using insulin or taking certain medicines for diabetes mellitus. Alcohol Alcohol can cause sudden decreases in blood glucose (hypoglycemia), especially if you use insulin or take certain medicines for diabetes mellitus. Hypoglycemia can be a life-threatening condition. Symptoms of hypoglycemia (sleepiness, dizziness, and disorientation) are similar to symptoms of having too much alcohol.  If your health care provider has given you approval to drink alcohol, do so in moderation and use the following guidelines:  Women should not have more than one drink per day, and men should not have more than two drinks per day. One drink is equal to:  12 oz of beer.  5 oz of wine.  1 oz of hard liquor.  Do not drink on an empty stomach.  Keep yourself hydrated. Have water, diet soda, or unsweetened iced tea.  Regular soda, juice, and other mixers might contain a lot of carbohydrates and should be counted. WHAT FOODS ARE NOT RECOMMENDED? As you make food choices, it is important to remember that all foods are not the same. Some foods have fewer nutrients per serving than other foods, even though they might have the same number of calories or carbohydrates. It is difficult to get your body what it needs when you eat foods with fewer nutrients. Examples of foods that you should avoid that are high in calories and carbohydrates but low in nutrients include:  Trans fats (most processed foods list trans fats on the  Nutrition Facts label).  Regular soda.  Juice.  Candy.  Sweets, such as cake, pie, doughnuts, and cookies.  Fried foods. WHAT FOODS CAN I EAT? Eat nutrient-rich foods, which will nourish your body and keep you healthy. The food you should eat also will depend on several factors, including:  The calories you need.  The medicines you take.  Your  weight.  Your blood glucose level.  Your blood pressure level.  Your cholesterol level. You should eat a variety of foods, including:  Protein.  Lean cuts of meat.  Proteins low in saturated fats, such as fish, egg whites, and beans. Avoid processed meats.  Fruits and vegetables.  Fruits and vegetables that may help control blood glucose levels, such as apples, mangoes, and yams.  Dairy products.  Choose fat-free or low-fat dairy products, such as milk, yogurt, and cheese.  Grains, bread, pasta, and rice.  Choose whole grain products, such as multigrain bread, whole oats, and brown rice. These foods may help control blood pressure.  Fats.  Foods containing healthful fats, such as nuts, avocado, olive oil, canola oil, and fish. DOES EVERYONE WITH DIABETES MELLITUS HAVE THE SAME MEAL PLAN? Because every person with diabetes mellitus is different, there is not one meal plan that works for everyone. It is very important that you meet with a dietitian who will help you create a meal plan that is just right for you.   This information is not intended to replace advice given to you by your health care provider. Make sure you discuss any questions you have with your health care provider.   Document Released: 08/03/2005 Document Revised: 11/27/2014 Document Reviewed: 10/03/2013 Elsevier Interactive Patient Education 2016 [employer].  -   Diabetes and Exercise Exercising regularly is important. It is not just about losing weight. It has many health benefits, such as:  Improving your overall fitness, flexibility, and endurance.  Increasing your bone density.  Helping with weight control.  Decreasing your body fat.  Increasing your muscle strength.  Reducing stress and tension.  Improving your overall health. People with diabetes who exercise gain additional benefits because exercise:  Reduces appetite.  Improves the body's use of blood sugar (glucose).  Helps  lower or control blood glucose.  Decreases blood pressure.  Helps control blood lipids (such as cholesterol and triglycerides).  Improves the body's use of the hormone insulin by:  Increasing the body's insulin sensitivity.  Reducing the body's insulin needs.  Decreases the risk for heart disease because exercising:  Lowers cholesterol and triglycerides levels.  Increases the levels of good cholesterol (such as high-density lipoproteins [HDL]) in the body.  Lowers blood glucose levels. YOUR ACTIVITY PLAN  Choose an activity that you enjoy, and set realistic goals. To exercise safely, you should begin practicing any new physical activity slowly, and gradually increase the intensity of the exercise over time. Your health care provider or diabetes educator can help create an activity plan that works for you. General recommendations include:  Encouraging children to engage in at least 60 minutes of physical activity each day.  Stretching and performing strength training exercises, such as yoga or weight lifting, at least 2 times per week.  Performing a total of at least 150 minutes of moderate-intensity exercise each week, such as brisk walking or water aerobics.  Exercising at least 3 days per week, making sure you allow no more than 2 consecutive days to pass without exercising.  Avoiding long periods of inactivity (90 minutes or more). When  you have to spend an extended period of time sitting down, take frequent breaks to walk or stretch. RECOMMENDATIONS FOR EXERCISING WITH TYPE 1 OR TYPE 2 DIABETES   Check your blood glucose before exercising. If blood glucose levels are greater than 240 mg/dL, check for urine ketones. Do not exercise if ketones are present.  Avoid injecting insulin into areas of the body that are going to be exercised. For example, avoid injecting insulin into:  The arms when playing tennis.  The legs when jogging.  Keep a record of:  Food intake before  and after you exercise.  Expected peak times of insulin action.  Blood glucose levels before and after you exercise.  The type and amount of exercise you have done.  Review your records with your health care provider. Your health care provider will help you to develop guidelines for adjusting food intake and insulin amounts before and after exercising.  If you take insulin or oral hypoglycemic agents, watch for signs and symptoms of hypoglycemia. They include:  Dizziness.  Shaking.  Sweating.  Chills.  Confusion.  Drink plenty of water while you exercise to prevent dehydration or heat stroke. Body water is lost during exercise and must be replaced.  Talk to your health care provider before starting an exercise program to make sure it is safe for you. Remember, almost any type of activity is better than none.   This information is not intended to replace advice given to you by your health care provider. Make sure you discuss any questions you have with your health care provider.   Document Released: 01/27/2004 Document Revised: 03/23/2015 Document Reviewed: 04/15/2013 Elsevier Interactive Patient Education 2016 [place] DASH stands for "Dietary Approaches to Stop Hypertension." The DASH eating plan is a healthy eating plan that has been shown to reduce high blood pressure (hypertension). Additional health benefits may include reducing the risk of type 2 diabetes mellitus, heart disease, and stroke. The DASH eating plan may also help with weight loss. WHAT DO I NEED TO KNOW ABOUT THE DASH EATING PLAN? For the DASH eating plan, you will follow these general guidelines:  Choose foods with a percent daily value for sodium of less than 5% (as listed on the food label).  Use salt-free seasonings or herbs instead of table salt or sea salt.  Check with your health care provider or pharmacist before using salt substitutes.  Eat lower-sodium products, often  labeled as "lower sodium" or "no salt added."  Eat fresh foods.  Eat more vegetables, fruits, and low-fat dairy products.  Choose whole grains. Look for the word "whole" as the first word in the ingredient list.  Choose fish and skinless chicken or [place] more often than red meat. Limit fish, poultry, and meat to 6 oz (170 g) each day.  Limit sweets, desserts, sugars, and sugary drinks.  Choose heart-healthy fats.  Limit cheese to 1 oz (28 g) per day.  Eat more home-cooked food and less restaurant, buffet, and fast food.  Limit fried foods.  Cook foods using methods other than frying.  Limit canned vegetables. If you do use them, rinse them well to decrease the sodium.  When eating at a restaurant, ask that your food be prepared with less salt, or no salt if possible. WHAT FOODS CAN I EAT? Seek help from a dietitian for individual calorie needs. Grains Whole grain or whole wheat bread. Brown rice. Whole grain or whole wheat pasta. Quinoa, bulgur, and whole grain  cereals. Low-sodium cereals. Corn or whole wheat flour tortillas. Whole grain cornbread. Whole grain crackers. Low-sodium crackers. Vegetables Fresh or frozen vegetables (raw, steamed, roasted, or grilled). Low-sodium or reduced-sodium tomato and vegetable juices. Low-sodium or reduced-sodium tomato sauce and paste. Low-sodium or reduced-sodium canned vegetables.  Fruits All fresh, canned (in natural juice), or frozen fruits. Meat and Other Protein Products Ground beef (85% or leaner), grass-fed beef, or beef trimmed of fat. Skinless chicken or [place]. Ground chicken or [place]. Pork trimmed of fat. All fish and seafood. Eggs. Dried beans, peas, or lentils. Unsalted nuts and seeds. Unsalted canned beans. Dairy Low-fat dairy products, such as skim or 1% milk, 2% or reduced-fat cheeses, low-fat ricotta or cottage cheese, or plain low-fat yogurt. Low-sodium or reduced-sodium cheeses. Fats and Oils Tub margarines without  trans fats. Light or reduced-fat mayonnaise and salad dressings (reduced sodium). Avocado. Safflower, olive, or canola oils. Natural peanut or almond butter. Other Unsalted popcorn and pretzels. The items listed above may not be a complete list of recommended foods or beverages. Contact your dietitian for more options. WHAT FOODS ARE NOT RECOMMENDED? Grains White bread. White pasta. White rice. Refined cornbread. Bagels and croissants. Crackers that contain trans fat. Vegetables Creamed or fried vegetables. Vegetables in a cheese sauce. Regular canned vegetables. Regular canned tomato sauce and paste. Regular tomato and vegetable juices. Fruits Dried fruits. Canned fruit in light or heavy syrup. Fruit juice. Meat and Other Protein Products Fatty cuts of meat. Ribs, chicken wings, bacon, sausage, bologna, salami, chitterlings, fatback, hot dogs, bratwurst, and packaged luncheon meats. Salted nuts and seeds. Canned beans with salt. Dairy Whole or 2% milk, cream, half-and-half, and cream cheese. Whole-fat or sweetened yogurt. Full-fat cheeses or blue cheese. Nondairy creamers and whipped toppings. Processed cheese, cheese spreads, or cheese curds. Condiments Onion and garlic salt, seasoned salt, table salt, and sea salt. Canned and packaged gravies. Worcestershire sauce. Tartar sauce. Barbecue sauce. Teriyaki sauce. Soy sauce, including reduced sodium. Steak sauce. Fish sauce. Oyster sauce. Cocktail sauce. Horseradish. Ketchup and mustard. Meat flavorings and tenderizers. Bouillon cubes. Hot sauce. Tabasco sauce. Marinades. Taco seasonings. Relishes. Fats and Oils Butter, stick margarine, lard, shortening, ghee, and bacon fat. Coconut, palm kernel, or palm oils. Regular salad dressings. Other Pickles and olives. Salted popcorn and pretzels. The items listed above may not be a complete list of foods and beverages to avoid. Contact your dietitian for more information. WHERE CAN I FIND MORE  INFORMATION? National Heart, Lung, and Blood Institute: travelstabloid.com   This information is not intended to replace advice given to you by your health care provider. Make sure you discuss any questions you have with your health care provider.   Document Released: 10/26/2011 Document Revised: 11/27/2014 Document Reviewed: 09/10/2013 Elsevier Interactive Patient Education 2016 Elsevier Inc.  -  Hypertension Hypertension is another name for high blood pressure. High blood pressure forces your heart to work harder to pump blood. A blood pressure reading has two numbers, which includes a higher number over a lower number (example: 110/72). HOME CARE   Have your blood pressure rechecked by your doctor.  Only take medicine as told by your doctor. Follow the directions carefully. The medicine does not work as well if you skip doses. Skipping doses also puts you at risk for problems.  Do not smoke.  Monitor your blood pressure at home as told by your doctor. GET HELP IF:  You think you are having a reaction to the medicine you are taking.  You have repeat  headaches or feel dizzy.  You have puffiness (swelling) in your ankles.  You have trouble with your vision. GET HELP RIGHT AWAY IF:   You get a very bad headache and are confused.  You feel weak, numb, or faint.  You get chest or belly (abdominal) pain.  You throw up (vomit).  You cannot breathe very well. MAKE SURE YOU:   Understand these instructions.  Will watch your condition.  Will get help right away if you are not doing well or get worse.   This information is not intended to replace advice given to you by your health care provider. Make sure you discuss any questions you have with your health care provider.   Document Released: 04/24/2008 Document Revised: 11/11/2013 Document Reviewed: 08/29/2013 Elsevier Interactive Patient Education 2016 [place] DASH stands for "Dietary Approaches to Stop Hypertension." The DASH eating plan is a healthy eating plan that has been shown to reduce high blood pressure (hypertension). Additional health benefits may include reducing the risk of type 2 diabetes mellitus, heart disease, and stroke. The DASH eating plan may also help with weight loss. WHAT DO I NEED TO KNOW ABOUT THE DASH EATING PLAN? For the DASH eating plan, you will follow these general guidelines:  Choose foods with a percent daily value for sodium of less than 5% (as listed on the food label).  Use salt-free seasonings or herbs instead of table salt or sea salt.  Check with your health care provider or pharmacist before using salt substitutes.  Eat lower-sodium products, often labeled as "lower sodium" or "no salt added."  Eat fresh foods.  Eat more vegetables, fruits, and low-fat dairy products.  Choose whole grains. Look for the word "whole" as the first word in the ingredient list.  Choose fish and skinless chicken or [place] more often than red meat. Limit fish, poultry, and meat to 6 oz (170 g) each day.  Limit sweets, desserts, sugars, and sugary drinks.  Choose heart-healthy fats.  Limit cheese to 1 oz (28 g) per day.  Eat more home-cooked food and less restaurant, buffet, and fast food.  Limit fried foods.  Cook foods using methods other than frying.  Limit canned vegetables. If you do use them, rinse them well to decrease the sodium.  When eating at a restaurant, ask that your food be prepared with less salt, or no salt if possible. WHAT FOODS CAN I EAT? Seek help from a dietitian for individual calorie needs. Grains Whole grain or whole wheat bread. Brown rice. Whole grain or whole wheat pasta. Quinoa, bulgur, and whole grain cereals. Low-sodium cereals. Corn or whole wheat flour tortillas. Whole grain cornbread. Whole grain crackers. Low-sodium crackers. Vegetables Fresh or frozen vegetables (raw, steamed,  roasted, or grilled). Low-sodium or reduced-sodium tomato and vegetable juices. Low-sodium or reduced-sodium tomato sauce and paste. Low-sodium or reduced-sodium canned vegetables.  Fruits All fresh, canned (in natural juice), or frozen fruits. Meat and Other Protein Products Ground beef (85% or leaner), grass-fed beef, or beef trimmed of fat. Skinless chicken or [place]. Ground chicken or [place]. Pork trimmed of fat. All fish and seafood. Eggs. Dried beans, peas, or lentils. Unsalted nuts and seeds. Unsalted canned beans. Dairy Low-fat dairy products, such as skim or 1% milk, 2% or reduced-fat cheeses, low-fat ricotta or cottage cheese, or plain low-fat yogurt. Low-sodium or reduced-sodium cheeses. Fats and Oils Tub margarines without trans fats. Light or reduced-fat mayonnaise and salad dressings (reduced sodium). Avocado.  Safflower, olive, or canola oils. Natural peanut or almond butter. Other Unsalted popcorn and pretzels. The items listed above may not be a complete list of recommended foods or beverages. Contact your dietitian for more options. WHAT FOODS ARE NOT RECOMMENDED? Grains White bread. White pasta. White rice. Refined cornbread. Bagels and croissants. Crackers that contain trans fat. Vegetables Creamed or fried vegetables. Vegetables in a cheese sauce. Regular canned vegetables. Regular canned tomato sauce and paste. Regular tomato and vegetable juices. Fruits Dried fruits. Canned fruit in light or heavy syrup. Fruit juice. Meat and Other Protein Products Fatty cuts of meat. Ribs, chicken wings, bacon, sausage, bologna, salami, chitterlings, fatback, hot dogs, bratwurst, and packaged luncheon meats. Salted nuts and seeds. Canned beans with salt. Dairy Whole or 2% milk, cream, half-and-half, and cream cheese. Whole-fat or sweetened yogurt. Full-fat cheeses or blue cheese. Nondairy creamers and whipped toppings. Processed cheese, cheese spreads, or cheese curds. Condiments Onion  and garlic salt, seasoned salt, table salt, and sea salt. Canned and packaged gravies. Worcestershire sauce. Tartar sauce. Barbecue sauce. Teriyaki sauce. Soy sauce, including reduced sodium. Steak sauce. Fish sauce. Oyster sauce. Cocktail sauce. Horseradish. Ketchup and mustard. Meat flavorings and tenderizers. Bouillon cubes. Hot sauce. Tabasco sauce. Marinades. Taco seasonings. Relishes. Fats and Oils Butter, stick margarine, lard, shortening, ghee, and bacon fat. Coconut, palm kernel, or palm oils. Regular salad dressings. Other Pickles and olives. Salted popcorn and pretzels. The items listed above may not be a complete list of foods and beverages to avoid. Contact your dietitian for more information. WHERE CAN I FIND MORE INFORMATION? National Heart, Lung, and Blood Institute: travelstabloid.com   This information is not intended to replace advice given to you by your health care provider. Make sure you discuss any questions you have with your health care provider.   Document Released: 10/26/2011 Document Revised: 11/27/2014 Document Reviewed: 09/10/2013 Elsevier Interactive Patient Education [employer].

## 2016-07-25 NOTE — Progress Notes (Signed)
Pt is in the office today for establish care Pt states she is having pain all over

## 2016-07-26 ENCOUNTER — Telehealth: Payer: Self-pay

## 2016-07-26 LAB — HEPATITIS C ANTIBODY: HCV Ab: NEGATIVE

## 2016-07-26 LAB — MICROALBUMIN / CREATININE URINE RATIO
Creatinine, Urine: 138 mg/dL (ref 20–320)
MICROALB UR: 33.4 mg/dL
MICROALB/CREAT RATIO: 242 ug/mg{creat} — AB (ref <30)

## 2016-07-26 LAB — HIV ANTIBODY (ROUTINE TESTING W REFLEX): HIV: NONREACTIVE

## 2016-07-26 NOTE — Telephone Encounter (Signed)
Left message requesting return call re: lab results

## 2016-07-27 MED FILL — $novoLOG FLEXPEN SYRINGE: 100 | 30 days supply | Qty: 15 | Fill #3

## 2016-07-27 MED FILL — TRUE METRIX TEST STRIP: 12 days supply | Qty: 50 | Fill #1

## 2016-07-28 ENCOUNTER — Telehealth: Payer: Self-pay | Admitting: *Deleted

## 2016-07-28 NOTE — Telephone Encounter (Signed)
-----   Message from Maren Reamer, MD sent at 07/27/2016  9:18 AM EDT ----- Please call. There are signs of microscopic kidney damage noted in urine already, although overall kidney function nml. Need to take care of htn and diabetes better to prevent progression of kidney damage. thx

## 2016-07-28 NOTE — Telephone Encounter (Signed)
Patient verified DOB Patient is aware of microscopic kidney damage being noted in the urine. Patient advised to take HTN and DM medications as prescribed and monitor salt and carb intake. Patient expressed her understanding and had no further questions at this time.

## 2016-08-02 ENCOUNTER — Telehealth: Payer: Self-pay

## 2016-08-02 NOTE — Telephone Encounter (Signed)
Per chart pt is already aware of results

## 2016-08-09 ENCOUNTER — Encounter: Payer: Self-pay | Admitting: Pharmacist

## 2016-08-09 ENCOUNTER — Ambulatory Visit: Payer: Self-pay | Attending: Internal Medicine | Admitting: Pharmacist

## 2016-08-09 VITALS — BP 168/80 | HR 86

## 2016-08-09 DIAGNOSIS — IMO0002 Reserved for concepts with insufficient information to code with codable children: Secondary | ICD-10-CM

## 2016-08-09 DIAGNOSIS — Z794 Long term (current) use of insulin: Secondary | ICD-10-CM

## 2016-08-09 DIAGNOSIS — E1165 Type 2 diabetes mellitus with hyperglycemia: Secondary | ICD-10-CM

## 2016-08-09 DIAGNOSIS — E118 Type 2 diabetes mellitus with unspecified complications: Secondary | ICD-10-CM

## 2016-08-09 DIAGNOSIS — I10 Essential (primary) hypertension: Secondary | ICD-10-CM

## 2016-08-09 MED ORDER — SPIRONOLACTONE 25 MG PO TABS: 12.5000 mg | ORAL_TABLET | Freq: Every day | ORAL | 1 refills | Status: DC

## 2016-08-09 MED ORDER — METFORMIN HCL ER (MOD) 500 MG PO TB24: ORAL_TABLET | ORAL | 1 refills | Status: DC

## 2016-08-09 NOTE — Patient Instructions (Signed)
Thanks for coming to see [place] today!   Decrease Levemir to 65 units twice a day Change Novolog to 20 units three times a day with breakfast, lunch and dinner Change losartan to night time dosing Start taking metformin ER 500 mg - 1 tablet each day for 4 days, then increase to 1 tablet twice a day if you can tolerate it Start taking spironolactone 12.5 mg once each day  Call us if you keep having low blood sugars at night time or at any time in the day.  Keep checking your blood sugar like you have been and write it down in your log.   Avoid processed meats and carbohydrates (candy, bread, rice, noodles, soda).

## 2016-08-09 NOTE — Progress Notes (Signed)
S:     Diabetes Patient arrives in fair spirits, ambulating without assistance.    Presents for diabetes management and education.   Patient denies adherence with novolog as she was not aware she was supposed to take 20 units TID, has been taking 20 units BID (breakfast+dinner). Pt reports adherence with all other meds. Current diabetes medications include novolog 20 units BID, levemir 75 units BID.   Patient endorses hypoglycemic events, reporting that 2-3 times/week she has nocturnal lows when she wakes up to pee at night.   Patient reported dietary habits: Breakfast: eggs, grits, sausage, oatmeal+sugar Lunch: baked chicken, mashed potatoes Dinner: Broccoli+cheese, baked chicken thighs, roll Beverages: water, aloe vera drink ("to come off soda")   Patient reports nocturia x 1. Patient reports neuropathy. Patient reports visual changes. Patient reports self foot exams.  Hypertension Pt reports she takes all BP medications in the morning and endorses compliance Current HTN meds include amlodipine 10 mg daily, hydralazine 25 mg TID (recent increase), losartan 100 mg daily.  She reports having taken her meds this AM prior to visit.   O:  Vitals:   08/09/16 1142 08/09/16 1143  BP: (!) 190/101 (!) 168/80  Pulse: 87 86    Lab Results  Component Value Date   HGBA1C 12.4 07/25/2016     Home fasting CBG: 120s-140s  2 hour post-prandial/random CBG: lunch - 170s, dinners - 270s Late night (1:30 am) - 60s occasionally   A/P: Diabetes, longstanding, currently uncontrolled as evidenced by A1C and pt reported CBGs. Reports hypoglycemic events and is able to verbalize appropriate hypoglycemia management plan. Denies adherence with novolog schedule but endorses compliance with other medication. Control is suboptimal due to dietary indiscretion and sedentary lifestyle. Increase novolog to 20 units TID, decrease levemir to 65 units BID. Add metformin ER 500 mg daily x 4 days, increase to BID if  tolerating. Provided pt with CBG log book. Will consider adding aspirin and statin at next visit based on new lipid panel. Will call on Monday to assess CBGs and tolerance of metformin. Next A1C anticipated December.   HTN longstanding, currently uncontrolled as evidenced by BP in clinic x 2. Given reported adherence to medications, add spironolactone 12.5 mg daily, change losartan 100 mg to qHS dosing. Continue amlodipine and hydralazine.     Written patient instructions provided.  Follow up in Pharmacist Clinic Visit 2 weeks.   Total time in face to face counseling 45 minutes.  Patient seen with Christean Leaf, PharmD Candidate.

## 2016-08-11 ENCOUNTER — Other Ambulatory Visit: Payer: Self-pay | Admitting: Pharmacist

## 2016-08-11 MED ORDER — METFORMIN HCL ER 500 MG PO TB24: ORAL_TABLET | ORAL | 1 refills | Status: DC

## 2016-08-15 ENCOUNTER — Telehealth: Payer: Self-pay | Admitting: Pharmacist

## 2016-08-15 NOTE — Telephone Encounter (Signed)
Called patient to assess tolerance of medication changes since last appointment with Dr. Peterson Ao in pharmacy clinic. When asked how she was doing, pt states "I'm having a rough day," complaining of "full ears" and endorsing low energy and dizzy spells.  Pt reports that her FBG has been 70-79 for the last several days, denies nocturnal lows, other episodes of hypoglycemia.  Pt has been taking Levemir 75 BID (did not decrease to 65 BID as intended - patient reports she forgot), novolog 20 units BID (skipping dinner novolog to prevent fasting hypoglycemia as pre-dinner CBGs have been in the 100s), and has started metformin ER 500 mg daily. Pt reports she is tolerating metformin well.   Pt reports she didn't know about the new BP medication (spironolactone) and has not picked it up from the pharmacy. She intends to go to the pharmacy today to pick up pen needles and will inquire about spironolactone at that time.   Advised patient to reduce Levemir to 65 units BID as planned and continue with novolog 20 units BID with meals. Continue metformin daily and increase to BID dosing as tolerated. Will call patient on Thursday to assess tolerance of medication changes and CBGs.   Carlean Jews, Pharm.D. PGY1 Pharmacy Resident 9/26/20171:50 PM Pager [phone]

## 2016-08-17 ENCOUNTER — Telehealth: Payer: Self-pay | Admitting: Pharmacist

## 2016-08-17 NOTE — Telephone Encounter (Signed)
Called to follow up on CBG control and BP medication. Left HIPAA compliant message asking pt to call back to clinic.

## 2016-08-23 ENCOUNTER — Ambulatory Visit: Payer: Self-pay | Attending: Internal Medicine | Admitting: Pharmacist

## 2016-08-23 ENCOUNTER — Ambulatory Visit: Payer: No Typology Code available for payment source | Admitting: Podiatry

## 2016-08-23 ENCOUNTER — Encounter: Payer: Self-pay | Admitting: Podiatry

## 2016-08-23 DIAGNOSIS — H6121 Impacted cerumen, right ear: Secondary | ICD-10-CM

## 2016-08-23 DIAGNOSIS — E119 Type 2 diabetes mellitus without complications: Secondary | ICD-10-CM | POA: Insufficient documentation

## 2016-08-23 DIAGNOSIS — I10 Essential (primary) hypertension: Secondary | ICD-10-CM | POA: Insufficient documentation

## 2016-08-23 DIAGNOSIS — R0981 Nasal congestion: Secondary | ICD-10-CM

## 2016-08-23 DIAGNOSIS — Z794 Long term (current) use of insulin: Secondary | ICD-10-CM | POA: Insufficient documentation

## 2016-08-23 DIAGNOSIS — Z79899 Other long term (current) drug therapy: Secondary | ICD-10-CM | POA: Insufficient documentation

## 2016-08-23 DIAGNOSIS — M79675 Pain in left toe(s): Secondary | ICD-10-CM

## 2016-08-23 DIAGNOSIS — B351 Tinea unguium: Secondary | ICD-10-CM

## 2016-08-23 DIAGNOSIS — M79674 Pain in right toe(s): Secondary | ICD-10-CM

## 2016-08-23 MED ORDER — SPIRONOLACTONE 25 MG PO TABS: 12.5000 mg | ORAL_TABLET | Freq: Every day | ORAL | 1 refills | Status: DC

## 2016-08-23 MED ORDER — FLUTICASONE PROPIONATE 50 MCG/ACT NA SUSP: 2.0000 | Freq: Every day | NASAL | 6 refills | Status: DC

## 2016-08-23 MED ORDER — LORATADINE 10 MG PO TABS: 10.0000 mg | ORAL_TABLET | Freq: Every day | ORAL | 11 refills | Status: DC

## 2016-08-23 MED ORDER — CARBAMIDE PEROXIDE 6.5 % OT SOLN: 5.0000 [drp] | Freq: Two times a day (BID) | OTIC | 0 refills | Status: DC

## 2016-08-23 MED FILL — FLUTICASONE PROP 50 MCG SPR: 50 | 30 days supply | Qty: 16 | Fill #0

## 2016-08-23 MED FILL — ULTICARE PEN NDL 4MM 32G: 32G X 4 MM | 25 days supply | Qty: 100 | Fill #3

## 2016-08-23 MED FILL — SPIRONOLACTONE 25 MG TABLET: 25 | 30 days supply | Qty: 15 | Fill #0

## 2016-08-23 MED FILL — EAR DROPS 6.5%: 6.5 | 30 days supply | Qty: 15 | Fill #0

## 2016-08-23 NOTE — Progress Notes (Signed)
Katherine [name], is a 60 y.o. female  [id number]  [id number]:[id number]  DOB - 12-02-1955  No chief complaint on file.       Subjective:   Katherine [name] is a 60 y.o. female here today for a follow up visit for dm today. Pharmacist asked me to also eval pt.  She has been c/o of dizziness/bilat nose congestion and pressure on her right ear. She has been using the debrox gtt for ceruminosis religiously, but feels like pressures worse. Denies f/c/cough.  bp elevated today.   Patient has No headache, No chest pain, No abdominal pain - No Nausea, No new weakness tingling or numbness, No Cough - SOB.  No problems updated.  ALLERGIES: Allergies  Allergen Reactions  . Other Swelling    Seaweed= swelling on arms, hands and face    PAST MEDICAL HISTORY: Past Medical History:  Diagnosis Date  . Boil of buttock   . Diabetes mellitus   . Hypertension     MEDICATIONS AT HOME: Prior to Admission medications   Medication Sig Start Date End Date Taking? Authorizing Provider  amLODipine (NORVASC) 10 MG tablet Take 1 tablet (10 mg total) by mouth daily. 07/25/16   Maren Reamer, MD  Blood Glucose Monitoring Suppl (TRUE METRIX METER) W/DEVICE KIT USE AS INSTRUCTED 10/22/15   Lance Bosch, NP  carbamide peroxide (DEBROX) 6.5 % otic solution Place 5 drops into the right ear 2 (two) times daily. 08/23/16   Tresa Garter, MD  chlorhexidine (PERIDEX) 0.12 % solution Use as directed 15 mLs in the mouth or throat 2 (two) times daily. 01/12/16   Lance Bosch, NP  fluticasone (FLONASE) 50 MCG/ACT nasal spray Place 2 sprays into both nostrils daily. 08/23/16   Maren Reamer, MD  gabapentin (NEURONTIN) 300 MG capsule Take 1 capsule (300 mg total) by mouth 3 (three) times daily. Patient taking differently: Take 300 mg by mouth 4 (four) times daily.  05/08/16   Tresa Garter, MD  glucose blood (TRUE METRIX BLOOD GLUCOSE TEST) test strip Use as instructed 10/22/15   Lance Bosch, NP    hydrALAZINE (APRESOLINE) 25 MG tablet Take 1 tablet (25 mg total) by mouth 3 (three) times daily. 07/25/16   Maren Reamer, MD  insulin aspart (NOVOLOG FLEXPEN) 100 UNIT/ML FlexPen Inject 20 Units into the skin 3 (three) times daily with meals. 08/09/16   Tresa Garter, MD  Insulin Detemir (LEVEMIR FLEXTOUCH) 100 UNIT/ML Pen Inject 65 Units into the skin 2 (two) times daily. 08/09/16   Tresa Garter, MD  Insulin Pen Needle (ULTICARE MICRO PEN NEEDLES) 32G X 4 MM MISC USE AS DIRECTED 02/09/16   Lance Bosch, NP  Insulin Syringe-Needle U-100 28G X 1/2" 0.3 ML MISC Check blood sugar TID & QHS 04/08/15   Lorayne Marek, MD  loratadine (CLARITIN) 10 MG tablet Take 1 tablet (10 mg total) by mouth daily. 08/23/16   Maren Reamer, MD  losartan (COZAAR) 100 MG tablet Take 1 tablet (100 mg total) by mouth daily. 07/25/16   Maren Reamer, MD  meclizine (ANTIVERT) 12.5 MG tablet Take 1 tablet (12.5 mg total) by mouth 3 (three) times daily as needed for dizziness. 02/23/16   Okey Regal, PA-C  metFORMIN (GLUCOPHAGE XR) 500 MG 24 hr tablet TAKE 1 TABLET BY MOUTH ONCE A DAY FOR 4 DAYS, THEN INCREASE TO 1 TABLET TWICE DAILY IF TOLERATING 08/11/16   Tresa Garter, MD  OVER THE COUNTER MEDICATION Take  1 tablet by mouth daily. Over the counter supplement with iron and other ingredients    Historical Provider, MD  oxyCODONE-acetaminophen (PERCOCET) 5-325 MG tablet Take 1-2 tablets by mouth every 6 (six) hours as needed. 06/25/16   Veryl Speak, MD  Pyridoxine HCl (VITAMIN B-6 PO) Take 1 tablet by mouth daily.    Historical Provider, MD  spironolactone (ALDACTONE) 25 MG tablet Take 0.5 tablets (12.5 mg total) by mouth daily. 08/23/16   Tresa Garter, MD     Objective:   There were no vitals filed for this visit.  Exam General appearance : Awake, alert,   Speech Clear. Not toxic looking, ill appearing. HEENT: Atraumatic and Normocephalic, pupils equally reactive to light.  Right ear w/  softer ceruminosis, noted TM clear bilat. Left tM clear. Neck: supple, no JVD.  Chest:Good air entry bilaterally, no added sounds. CVS: S1 S2 regular, no murmurs/gallups or rubs.   Data Review Lab Results  Component Value Date   HGBA1C 12.4 07/25/2016   HGBA1C 13.1 01/12/2016   HGBA1C 13.20 09/29/2015    Depression screen PHQ 2/9 07/25/2016 02/02/2016  Decreased Interest 2 0  Down, Depressed, Hopeless 2 0  PHQ - 2 Score 4 0  Altered sleeping 1 -  Tired, decreased energy 3 -  Change in appetite 2 -  Feeling bad or failure about yourself  1 -  Trouble concentrating 1 -  Moving slowly or fidgety/restless 0 -  Suicidal thoughts 0 -  PHQ-9 Score 12 -      Assessment & Plan   1. Ceruminosis, right - Ear Lavage  - with great results, per pt can hear much better after procedure.  2. Nasal congestion Suspect uri vs seasonal allergies. Trial claritin and flonase for now.  3. Dm f/u Def to pharmacist, appreciate assist.     Patient have been counseled extensively about nutrition and exercise  No Follow-up on file.  The patient was given clear instructions to go to ER or return to medical center if symptoms don't improve, worsen or new problems develop. The patient verbalized understanding. The patient was told to call to get lab results if they haven't heard anything in the next week.   This note has been created with Surveyor, quantity. Any transcriptional errors are unintentional.   Maren Reamer, MD, [facility] [place], [place]   08/23/2016, 10:46 AM

## 2016-08-23 NOTE — Progress Notes (Signed)
S:    Patient arrives in fair spirits, ambulating without assistance. Presents for diabetes management and education.   Patient denies adherence with novolog as she was aware she was supposed to take 20 units TID, but has still been taking 20 units BID (breakfast+dinner). Pt reports adherence with all other meds. Current diabetes medications include novolog 20 units BID, levemir 65 units BID and metformin 500 mg daily.   She reports significant ear pain and sinus issues, which have lead her to not eat well or check her blood glucose at home as much as she should be. She has seen some numbers in the 100s and a few in the 200s but did not bring her meter or log book with her.   She did have some stomach upset with the metformin so she has only been taking 1 tablet a day. She wants to stay on it and will continue to try to get to 2 tablets a day.    O:  There were no vitals filed for this visit.  Lab Results  Component Value Date   HGBA1C 12.4 07/25/2016     A/P: Diabetes, longstanding, currently uncontrolled as evidenced by A1C and pt reported CBGs. Reports hypoglycemic events and is able to verbalize appropriate hypoglycemia management plan. Denies adherence with novolog schedule but endorses compliance with other medication. Control is suboptimal due to dietary indiscretion and sedentary lifestyle.   Continue current medications as prescribed. Consulted Dr. Janne Napoleon for ear evaluation - once resolved, will continue with diabetes management in the coming weeks.   Written patient instructions provided.  Follow up in Pharmacist Clinic Visit 2 weeks.   Total time in face to face counseling 15 minutes.  Patient seen with Biagio Borg, PharmD Candidate

## 2016-08-23 NOTE — Patient Instructions (Signed)
Diabetes and Foot Care Diabetes may cause you to have problems because of poor blood supply (circulation) to your feet and legs. This may cause the skin on your feet to become thinner, break easier, and heal more slowly. Your skin may become dry, and the skin may peel and crack. You may also have nerve damage in your legs and feet causing decreased feeling in them. You may not notice minor injuries to your feet that could lead to infections or more serious problems. Taking care of your feet is one of the most important things you can do for yourself.  HOME CARE INSTRUCTIONS  Wear shoes at all times, even in the house. Do not go barefoot. Bare feet are easily injured.  Check your feet daily for blisters, cuts, and redness. If you cannot see the bottom of your feet, use a mirror or ask someone for help.  Wash your feet with warm water (do not use hot water) and mild soap. Then pat your feet and the areas between your toes until they are completely dry. Do not soak your feet as this can dry your skin.  Apply a moisturizing lotion or petroleum jelly (that does not contain alcohol and is unscented) to the skin on your feet and to dry, brittle toenails. Do not apply lotion between your toes.  Trim your toenails straight across. Do not dig under them or around the cuticle. File the edges of your nails with an emery board or nail file.  Do not cut corns or calluses or try to remove them with medicine.  Wear clean socks or stockings every day. Make sure they are not too tight. Do not wear knee-high stockings since they may decrease blood flow to your legs.  Wear shoes that fit properly and have enough cushioning. To break in new shoes, wear them for just a few hours a day. This prevents you from injuring your feet. Always look in your shoes before you put them on to be sure there are no objects inside.  Do not cross your legs. This may decrease the blood flow to your feet.  If you find a minor scrape,  cut, or break in the skin on your feet, keep it and the skin around it clean and dry. These areas may be cleansed with mild soap and water. Do not cleanse the area with peroxide, alcohol, or iodine.  When you remove an adhesive bandage, be sure not to damage the skin around it.  If you have a wound, look at it several times a day to make sure it is healing.  Do not use heating pads or hot water bottles. They may burn your skin. If you have lost feeling in your feet or legs, you may not know it is happening until it is too late.  Make sure your health care provider performs a complete foot exam at least annually or more often if you have foot problems. Report any cuts, sores, or bruises to your health care provider immediately. SEEK MEDICAL CARE IF:   You have an injury that is not healing.  You have cuts or breaks in the skin.  You have an ingrown nail.  You notice redness on your legs or feet.  You feel burning or tingling in your legs or feet.  You have pain or cramps in your legs and feet.  Your legs or feet are numb.  Your feet always feel cold. SEEK IMMEDIATE MEDICAL CARE IF:   There is increasing redness,   swelling, or pain in or around a wound.  There is a red line that goes up your leg.  Pus is coming from a wound.  You develop a fever or as directed by your health care provider.  You notice a bad smell coming from an ulcer or wound.   This information is not intended to replace advice given to you by your health care provider. Make sure you discuss any questions you have with your health care provider.   Document Released: 11/03/2000 Document Revised: 07/09/2013 Document Reviewed: 04/15/2013 Elsevier Interactive Patient Education 2016 Elsevier Inc.  

## 2016-08-23 NOTE — Patient Instructions (Signed)
Thanks for coming to see [place]!  Hope you feel better!!!  Try to monitor your blood sugars - our goal is to keep it in the 100s!  Follow up with Dr. Janne Napoleon

## 2016-08-24 NOTE — Progress Notes (Signed)
Patient ID: Katherine [name], female   DOB: [birth year], 60 y.o.   MRN: [id number]     Subjective: This patient presents today complaining of toenails that are uncomfortable walking wearing shoes and requests toenail debridement.  Patient is type II diabetic and denies any history of ulceration, claudication or amputation  Vascular: Nonpitting edema bilaterally No calf pain or calf tenderness bilaterally DP pulses 2/4 bilaterally PT pulses 1/4 bilaterally Capillary reflex immediate bilaterally  Neurological: Sensation to 10 g monofilament wire intact 3/5 right 2/5 left Vibratory sensation reactive bilaterally Ankle reflexes equal reactive bilaterally  Dermatological No open skin lesions bilaterally The toenails are extremely hypertrophic, elongated, deformed, discolored and tender draped palpation 6-10  Musculoskeletal: Pes planus bilaterally HAV deformity left There is no restriction ankle, subtalar, midtarsal joints bilaterally  Assessment: Decrease pedal pulses bilaterally Decreased protective sensation bilaterally Type II diabetic Neglected symptomatic onychomycoses 6-10  Plan: The toenails 6-10 were debrided mechanically and electrically without any bleeding  Reappoint 3 months

## 2016-09-10 ENCOUNTER — Other Ambulatory Visit: Payer: Self-pay | Admitting: Internal Medicine

## 2016-09-10 DIAGNOSIS — I10 Essential (primary) hypertension: Secondary | ICD-10-CM

## 2016-09-19 ENCOUNTER — Ambulatory Visit: Payer: Self-pay | Attending: Internal Medicine | Admitting: Internal Medicine

## 2016-09-19 ENCOUNTER — Encounter: Payer: Self-pay | Admitting: Internal Medicine

## 2016-09-19 ENCOUNTER — Encounter: Payer: Self-pay | Admitting: Gastroenterology

## 2016-09-19 VITALS — BP 175/78 | HR 84 | Temp 97.4°F | Resp 16 | Wt 221.0 lb

## 2016-09-19 DIAGNOSIS — Z794 Long term (current) use of insulin: Secondary | ICD-10-CM | POA: Insufficient documentation

## 2016-09-19 DIAGNOSIS — I10 Essential (primary) hypertension: Secondary | ICD-10-CM

## 2016-09-19 DIAGNOSIS — Z79899 Other long term (current) drug therapy: Secondary | ICD-10-CM | POA: Insufficient documentation

## 2016-09-19 DIAGNOSIS — E089 Diabetes mellitus due to underlying condition without complications: Secondary | ICD-10-CM

## 2016-09-19 DIAGNOSIS — E1165 Type 2 diabetes mellitus with hyperglycemia: Secondary | ICD-10-CM

## 2016-09-19 DIAGNOSIS — E118 Type 2 diabetes mellitus with unspecified complications: Secondary | ICD-10-CM | POA: Insufficient documentation

## 2016-09-19 DIAGNOSIS — Z23 Encounter for immunization: Secondary | ICD-10-CM

## 2016-09-19 DIAGNOSIS — IMO0002 Reserved for concepts with insufficient information to code with codable children: Secondary | ICD-10-CM

## 2016-09-19 LAB — LIPID PANEL
Cholesterol: 205 mg/dL — ABNORMAL HIGH (ref 125–200)
HDL: 54 mg/dL (ref >=46)
LDL Cholesterol: 115 mg/dL (ref <130)
Total CHOL/HDL Ratio: 3.8 Ratio (ref <=5.0)
Triglycerides: 179 mg/dL — ABNORMAL HIGH (ref <150)
VLDL: 36 mg/dL — ABNORMAL HIGH (ref <30)

## 2016-09-19 LAB — GLUCOSE, POCT (MANUAL RESULT ENTRY)
POC Glucose: 295 mg/dl — AB (ref 70–99)
POC Glucose: 330 mg/dl — AB (ref 70–99)

## 2016-09-19 MED ORDER — ASPIRIN 81 MG PO CHEW: 81.0000 mg | CHEWABLE_TABLET | Freq: Every day | ORAL | 3 refills | Status: DC

## 2016-09-19 MED ORDER — INSULIN ASPART 100 UNIT/ML ~~LOC~~ SOLN
10.0000 [IU] | Freq: Once | SUBCUTANEOUS | Status: AC
  Administered 2016-09-19: 10 [IU] via SUBCUTANEOUS

## 2016-09-19 MED ORDER — GABAPENTIN 300 MG PO CAPS: 300.0000 mg | ORAL_CAPSULE | Freq: Three times a day (TID) | ORAL | 3 refills | Status: DC

## 2016-09-19 MED ORDER — PRAVASTATIN SODIUM 20 MG PO TABS: 20.0000 mg | ORAL_TABLET | Freq: Every day | ORAL | 3 refills | Status: DC

## 2016-09-19 MED ORDER — HYDRALAZINE HCL 50 MG PO TABS: 50.0000 mg | ORAL_TABLET | Freq: Three times a day (TID) | ORAL | 3 refills | Status: DC

## 2016-09-19 MED ORDER — INSULIN ASPART 100 UNIT/ML FLEXPEN: 25.0000 [IU] | PEN_INJECTOR | Freq: Three times a day (TID) | SUBCUTANEOUS | 9 refills | Status: DC

## 2016-09-19 NOTE — Progress Notes (Signed)
Katherine [name], is a 60 y.o. female  [id number]  [id number]  DOB - 1956/08/05  Chief Complaint  Patient presents with  . Hypertension  . Diabetes        Subjective:   Katherine [name] is a 60 y.o. female here today for a follow up visit.  Last seen in clinic 07/25/16.  Doing better overall, but tired of all insulin. Says she is taking her insulin as instructed, eating very little salt and watching her diet. Tired.  Today for breakfast, she had cottage cheese and peaches from cup. Took her insulin as directed, however cbg now 330. Discouraged not losing weight and her sugars are so high.   Patient has No headache, No chest pain, No abdominal pain - No Nausea, No new weakness tingling or numbness, No Cough - SOB.   Here w/ young grandson.   No problems updated.  ALLERGIES: Allergies  Allergen Reactions  . Other Swelling    Seaweed= swelling on arms, hands and face    PAST MEDICAL HISTORY: Past Medical History:  Diagnosis Date  . Boil of buttock   . Diabetes mellitus   . Hypertension     MEDICATIONS AT HOME: Prior to Admission medications   Medication Sig Start Date End Date Taking? Authorizing Provider  amLODipine (NORVASC) 10 MG tablet TAKE 1 Tablet BY MOUTH ONCE DAILY 09/11/16  Yes Maren Reamer, MD  Blood Glucose Monitoring Suppl (TRUE METRIX METER) W/DEVICE KIT USE AS INSTRUCTED 10/22/15  Yes Lance Bosch, NP  carbamide peroxide (DEBROX) 6.5 % otic solution Place 5 drops into the right ear 2 (two) times daily. 08/23/16  Yes Tresa Garter, MD  fluticasone (FLONASE) 50 MCG/ACT nasal spray Place 2 sprays into both nostrils daily. 08/23/16  Yes Maren Reamer, MD  glucose blood (TRUE METRIX BLOOD GLUCOSE TEST) test strip Use as instructed 10/22/15  Yes Lance Bosch, NP  hydrALAZINE (APRESOLINE) 50 MG tablet Take 1 tablet (50 mg total) by mouth 3 (three) times daily. 09/19/16  Yes Aubree Doody Lazarus Gowda, MD  insulin aspart (NOVOLOG FLEXPEN) 100 UNIT/ML FlexPen  Inject 25 Units into the skin 3 (three) times daily with meals. 09/19/16  Yes Maren Reamer, MD  Insulin Detemir (LEVEMIR FLEXTOUCH) 100 UNIT/ML Pen Inject 65 Units into the skin 2 (two) times daily. 08/09/16  Yes Tresa Garter, MD  Insulin Pen Needle (ULTICARE MICRO PEN NEEDLES) 32G X 4 MM MISC USE AS DIRECTED 02/09/16  Yes Lance Bosch, NP  Insulin Syringe-Needle U-100 28G X 1/2" 0.3 ML MISC Check blood sugar TID & QHS 04/08/15  Yes Deepak Advani, MD  loratadine (CLARITIN) 10 MG tablet Take 1 tablet (10 mg total) by mouth daily. 08/23/16  Yes Chiante Peden Lazarus Gowda, MD  losartan (COZAAR) 100 MG tablet TAKE 1 Tablet BY MOUTH ONCE DAILY 09/11/16  Yes Maren Reamer, MD  metFORMIN (GLUCOPHAGE XR) 500 MG 24 hr tablet TAKE 1 TABLET BY MOUTH ONCE A DAY FOR 4 DAYS, THEN INCREASE TO 1 TABLET TWICE DAILY IF TOLERATING 08/11/16  Yes Olugbemiga E Doreene Burke, MD  OVER THE COUNTER MEDICATION Take 1 tablet by mouth daily. Over the counter supplement with iron and other ingredients   Yes Historical Provider, MD  spironolactone (ALDACTONE) 25 MG tablet Take 0.5 tablets (12.5 mg total) by mouth daily. 08/23/16  Yes Tresa Garter, MD  aspirin (ASPIRIN CHILDRENS) 81 MG chewable tablet Chew 1 tablet (81 mg total) by mouth daily. 09/19/16   Maren Reamer, MD  chlorhexidine (PERIDEX) 0.12 % solution Use as directed 15 mLs in the mouth or throat 2 (two) times daily. Patient not taking: Reported on 09/19/2016 01/12/16   Lance Bosch, NP  gabapentin (NEURONTIN) 300 MG capsule Take 1 capsule (300 mg total) by mouth 3 (three) times daily. 09/19/16   Maren Reamer, MD  meclizine (ANTIVERT) 12.5 MG tablet Take 1 tablet (12.5 mg total) by mouth 3 (three) times daily as needed for dizziness. Patient not taking: Reported on 09/19/2016 02/23/16   Okey Regal, PA-C  oxyCODONE-acetaminophen (PERCOCET) 5-325 MG tablet Take 1-2 tablets by mouth every 6 (six) hours as needed. Patient not taking: Reported on 09/19/2016 06/25/16    Veryl Speak, MD  pravastatin (PRAVACHOL) 20 MG tablet Take 1 tablet (20 mg total) by mouth daily. 09/19/16   Maren Reamer, MD  Pyridoxine HCl (VITAMIN B-6 PO) Take 1 tablet by mouth daily.    Historical Provider, MD     Objective:   Vitals:   09/19/16 1425  BP: (!) 175/78  Pulse: 84  Resp: 16  Temp: 97.4 F (36.3 C)  TempSrc: Oral  SpO2: 96%  Weight: 221 lb (100.2 kg)    Exam General appearance : Awake, alert, not in any distress. Speech Clear. Not toxic looking, pleasant HEENT: Atraumatic and Normocephalic, pupils equally reactive to light. Neck: supple, no JVD.  Chest:Good air entry bilaterally, no added sounds. CVS: S1 S2 regular, no murmurs/gallups or rubs. Abdomen: Bowel sounds active, Non tender, obese and not distended with no gaurding, rigidity or rebound. Extremities: B/L Lower Ext shows no edema, both legs are warm to touch Neurology: Awake alert, and oriented X 3, CN II-XII grossly intact, Non focal Skin:No Rash  Data Review Lab Results  Component Value Date   HGBA1C 12.4 07/25/2016   HGBA1C 13.1 01/12/2016   HGBA1C 13.20 09/29/2015    Depression screen PHQ 2/9 09/19/2016 07/25/2016 02/02/2016  Decreased Interest 1 2 0  Down, Depressed, Hopeless 1 2 0  PHQ - 2 Score 2 4 0  Altered sleeping 3 1 -  Tired, decreased energy 3 3 -  Change in appetite 1 2 -  Feeling bad or failure about yourself  0 1 -  Trouble concentrating 1 1 -  Moving slowly or fidgety/restless 1 0 -  Suicidal thoughts 0 0 -  PHQ-9 Score 11 12 -      Assessment & Plan   1. HYPERTENSION, BENIGN ESSENTIAL - same norvasc 10 and losartan 100qd - hydrALAZINE (APRESOLINE) 50 MG tablet; Take 1 tablet (50 mg total) by mouth 3 (three) times daily.  Dispense: 120 tablet; Refill: 3  2. Uncontrolled type 2 diabetes mellitus with complication, with long-term current use of insulin (HCC) - recd low carb diet for weight loss and insulin resistance - gave pt info on LCHF/if diet, recd  checking sugars at least 3 x day if she does this b/c her insulin needs will decrease w/ restriction. - Glucose (CBG) - insulin aspart (novoLOG) injection 10 Units; Inject 0.1 mLs (10 Units total) into the skin once. - increase novolog to 25 tid for now - Ambulatory referral to Ophthalmology - Lipid Panel - Glucose (CBG) - fu [facility] 2 wks - bring in her glucometer to correlate, she states get more nml cbgs at home, but does not chk often. - gabapentin (NEURONTIN) 300 MG capsule; Take 1 capsule (300 mg total) by mouth 3 (three) times daily.  Dispense: 90 capsule; Refill: 3  4. Encounter for immunization -  Flu Vaccine QUAD 36+ mos IM  5. Colon cancer screening - Ambulatory referral to Gastroenterology   Patient have been counseled extensively about nutrition and exercise  Return in about 4 weeks (around 10/17/2016) for htn/ dm/pap smear.  The patient was given clear instructions to go to ER or return to medical center if symptoms don't improve, worsen or new problems develop. The patient verbalized understanding. The patient was told to call to get lab results if they haven't heard anything in the next week.   This note has been created with Surveyor, quantity. Any transcriptional errors are unintentional.   Maren Reamer, MD, [facility] [place], [place]   09/19/2016, 3:28 PM

## 2016-09-19 NOTE — Patient Instructions (Addendum)
- financial aid packet if does not have - bring in blood sugar monitoring next appt  Katherine [name] - pharm clinic 2 wks dm chk  Check blood sugars on waking up 2  times a week  Also check blood sugars about 2 hours after a meal and do this after different meals by rotation  Recommended blood sugar levels on waking up is 90-130 and about 2 hours after meal is 130-160  Please bring your blood sugar monitor to each visit, thank you  Restart exercise  Stay on Lipitor//choleseterol med.    Influenza Virus Vaccine injection (Fluarix) What is this medicine? INFLUENZA VIRUS VACCINE (in floo EN zuh VAHY ruhs vak SEEN) helps to reduce the risk of getting influenza also known as the flu. This medicine may be used for other purposes; ask your health care provider or pharmacist if you have questions. What should I tell my health care provider before I take this medicine? They need to know if you have any of these conditions: -bleeding disorder like hemophilia -fever or infection -Guillain-Barre syndrome or other neurological problems -immune system problems -infection with the human immunodeficiency virus (HIV) or AIDS -low blood platelet counts -multiple sclerosis -an unusual or allergic reaction to influenza virus vaccine, eggs, chicken proteins, latex, gentamicin, other medicines, foods, dyes or preservatives -pregnant or trying to get pregnant -breast-feeding How should I use this medicine? This vaccine is for injection into a muscle. It is given by a health care professional. A copy of Vaccine Information Statements will be given before each vaccination. Read this sheet carefully each time. The sheet may change frequently. Talk to your pediatrician regarding the use of this medicine in children. Special care may be needed. Overdosage: If you think you have taken too much of this medicine contact a poison control center or emergency room at once. NOTE: This medicine is only for you.  Do not share this medicine with others. What if I miss a dose? This does not apply. What may interact with this medicine? -chemotherapy or radiation therapy -medicines that lower your immune system like etanercept, anakinra, infliximab, and adalimumab -medicines that treat or prevent blood clots like warfarin -phenytoin -steroid medicines like prednisone or cortisone -theophylline -vaccines This list may not describe all possible interactions. Give your health care provider a list of all the medicines, herbs, non-prescription drugs, or dietary supplements you use. Also tell them if you smoke, drink alcohol, or use illegal drugs. Some items may interact with your medicine. What should I watch for while using this medicine? Report any side effects that do not go away within 3 days to your doctor or health care professional. Call your health care provider if any unusual symptoms occur within 6 weeks of receiving this vaccine. You may still catch the flu, but the illness is not usually as bad. You cannot get the flu from the vaccine. The vaccine will not protect against colds or other illnesses that may cause fever. The vaccine is needed every year. What side effects may I notice from receiving this medicine? Side effects that you should report to your doctor or health care professional as soon as possible: -allergic reactions like skin rash, itching or hives, swelling of the face, lips, or tongue Side effects that usually do not require medical attention (report to your doctor or health care professional if they continue or are bothersome): -fever -headache -muscle aches and pains -pain, tenderness, redness, or swelling at site where injected -weak or tired This list may  not describe all possible side effects. Call your doctor for medical advice about side effects. You may report side effects to FDA at 1-800-FDA-1088. Where should I keep my medicine? This vaccine is only given in a clinic,  pharmacy, doctor's office, or other health care setting and will not be stored at home. NOTE: This sheet is a summary. It may not cover all possible information. If you have questions about this medicine, talk to your doctor, pharmacist, or health care provider.    2016, Elsevier/Gold Standard. (2008-06-03 09:30:40)   - QUICK START PATIENT GUIDE TO LCHF/IF LOW CARB HIGH FAT / INTERMITTENT FASTING  Recommend: <50 gram carbohydrate a day for weightloss.  What is this diet and how does it work? o Insulin is a hormone made by your body that allows you to use sugar (glucose) from carbohydrates in the food you eat for energy or to store glucose (as fat) for future use  o Insulin levels need to be lowered in order to utilize our stored energy (fat) o Many struggling with obesity are insulin resistant and have high levels of insulin o This diet works to lower your insulin in two ways o Fasting - allows your insulin levels to naturally decrease  o Avoiding carbohydrates - carbs trigger increase in insulin Low Carb Healthy Fat (LCHF) o Get a free app for your phone, such as MyFitnessPal, to help you track your macronutrients (carbs/protein/fats) and to track your weight and body measurements to see your progress o Set your goal for around 10% carbs/20% protein/70% fat o A good starting goal for amount of net carbs per day is 50 grams (some will aim for 20 grams) o "Net carbs" refers to total grams of carbs minus grams of fiber (as fiber is not typically absorbed). For example, if a food has 5g total carb and 3g fiber, that would be 2g net carbs o Increase healthy fats - eg. olive oil, eggs, nuts, avocado, cheese, butter, coconut, meats, fish o Avoid high carb foods - eg. bread, pasta, potatoes, rice, cookies, soda, juice, anything sugary o Buy full-fat ingredients (avoid low-fat versions, which often have more sugar) o No need to count calories, but pay close attention to grams of carbs on  labels Intermittent Fasting (IF) o "Fasting" is going a period of time without eating - it helps to stay busy and well-hydrated o Purpose of fasting is to allow insulin levels to drop as low as possible, allowing your body to switch into fat-burning mode o With this diet there are many approaches to fasting, but 16:8 and 24hr fasts are commonly used o 16:8 fast, usually 5-7 days a week - Fasting for 16 hours of the day, then eating all meals for the day over course of 8 hours. o 24 hour fast, usually 1-3 days a week - Typically eating one meal a day, then fasting until the next day. Plenty of fluids (and some salt to help you hold onto fluids) are recommended during longer fasts.  o During fasts certain beverages are still acceptable - water, sparkling water, bone broth, black tea or coffee, or tea/coffee with small amount of heavy whipping cream Special note for those on diabetic medications o Discuss your medications with your physician. You may need to hold your medication or adjust to only taking when eating. Diabetics should keep close track of their blood sugars when making any changes to diet/meds, to ensure they are staying within normal limits For more info about LCHF/IF o Watch "  Therapeutic Fasting - Solving the Two-Compartment Problem" video by Dr. Sharman Cheek on YouTube ([email or web address].com.ee) for a great intro to these concepts o Read "The Obesity Code" and/or "The Complete Guide to Fasting" by Dr. Sharman Cheek o Go to www.dietdoctor.com for explanations, recipes, and infographics about foods to eat/avoid o Get a Free smartphone app that helps count carbohydrates  - ie MyKeto EXAMPLES TO GET STARTED Fasting Beverages -water (can add  tsp Gering salt once or twice a day to help stay hydrated for longer fasts) -Sparkling water (such as [employer] or similar; avoid any with artificial sweeteners)  -Bone broth (multiple recipes available online or can buy  pre-made) -Tea or Coffee (Adding heavy whipping cream or coconut oil to your tea or coffee can be helpful if you find yourself getting too hungry during the fasts. Can also add cinnamon for flavor. Or "bulletproof coffee.") Low Carb Healthy Fat Breakfast (if not fasting) -eggs in butter or olive oil with avocado -omelet with veggies and cheese  Lunch -hamburger with cheese and avocado wrapped in lettuce (no bun, no ketchup) -meat and cheese wrapped in lettuce (can dip in mustard or olive oil/vinegar/mayo) -salad with meat/cheese/nuts and higher fat dressing (vinaigrette or Ranch, etc) -tuna salad lettuce wrap -taco meat with cheese, sour cream, guacamole, cheese over lettuce  Dinner -steak with herb butter or Barnaise sauce -"Fathead" pizza (uses cheese and almond flour for the dough - several recipes available online) -roasted or grilled chicken with skin on, with low carb sauce (buffalo, garlic butter, alfredo, pesto, etc) -baked salmon with lemon butter -chicken alfredo with zucchini noodles -[place] butter chicken with low carb garlic naan -egg roll in a bowl  Side Dishes -mashed cauliflower (homemade or available in freezer section) -roast vegetables (green veggies that grow above ground rather than root veggies) with butter or cheese -Caprese salad (fresh mozzarella, tomato and basil with olive oil) -homemade low-carb coleslaw Snacks/Desserts (try to avoid unnecessary snacking and sweets in general) -celery or cucumber dipped in guacamole or sour cream dip -cheese and meat slices  -raspberries with whipped cream (can make homemade with no sugar added) -low carb [place] butter cake  AVOID - sugar, diet/regular soda, potatoes, breads, rice, pasta, candy, cookies, cakes, muffins, juice, high carb fruit (bananas, grapes), beer, ketchup, barbeque and other sweet sauces   - DASH Eating Plan DASH stands for "Dietary Approaches to Stop Hypertension." The DASH eating plan is a  healthy eating plan that has been shown to reduce high blood pressure (hypertension). Additional health benefits may include reducing the risk of type 2 diabetes mellitus, heart disease, and stroke. The DASH eating plan may also help with weight loss. WHAT DO I NEED TO KNOW ABOUT THE DASH EATING PLAN? For the DASH eating plan, you will follow these general guidelines:  Choose foods with a percent daily value for sodium of less than 5% (as listed on the food label).  Use salt-free seasonings or herbs instead of table salt or sea salt.  Check with your health care provider or pharmacist before using salt substitutes.  Eat lower-sodium products, often labeled as "lower sodium" or "no salt added."  Eat fresh foods.  Eat more vegetables, fruits, and low-fat dairy products.  Choose whole grains. Look for the word "whole" as the first word in the ingredient list.  Choose fish and skinless chicken or [place] more often than red meat. Limit fish, poultry, and meat to 6 oz (170 g) each day.  Limit sweets, desserts, sugars, and  sugary drinks.  Choose heart-healthy fats.  Limit cheese to 1 oz (28 g) per day.  Eat more home-cooked food and less restaurant, buffet, and fast food.  Limit fried foods.  Cook foods using methods other than frying.  Limit canned vegetables. If you do use them, rinse them well to decrease the sodium.  When eating at a restaurant, ask that your food be prepared with less salt, or no salt if possible. WHAT FOODS CAN I EAT? Seek help from a dietitian for individual calorie needs. Grains Whole grain or whole wheat bread. Brown rice. Whole grain or whole wheat pasta. Quinoa, bulgur, and whole grain cereals. Low-sodium cereals. Corn or whole wheat flour tortillas. Whole grain cornbread. Whole grain crackers. Low-sodium crackers. Vegetables Fresh or frozen vegetables (raw, steamed, roasted, or grilled). Low-sodium or reduced-sodium tomato and vegetable juices. Low-sodium  or reduced-sodium tomato sauce and paste. Low-sodium or reduced-sodium canned vegetables.  Fruits All fresh, canned (in natural juice), or frozen fruits. Meat and Other Protein Products Ground beef (85% or leaner), grass-fed beef, or beef trimmed of fat. Skinless chicken or [place]. Ground chicken or [place]. Pork trimmed of fat. All fish and seafood. Eggs. Dried beans, peas, or lentils. Unsalted nuts and seeds. Unsalted canned beans. Dairy Low-fat dairy products, such as skim or 1% milk, 2% or reduced-fat cheeses, low-fat ricotta or cottage cheese, or plain low-fat yogurt. Low-sodium or reduced-sodium cheeses. Fats and Oils Tub margarines without trans fats. Light or reduced-fat mayonnaise and salad dressings (reduced sodium). Avocado. Safflower, olive, or canola oils. Natural peanut or almond butter. Other Unsalted popcorn and pretzels. The items listed above may not be a complete list of recommended foods or beverages. Contact your dietitian for more options. WHAT FOODS ARE NOT RECOMMENDED? Grains White bread. White pasta. White rice. Refined cornbread. Bagels and croissants. Crackers that contain trans fat. Vegetables Creamed or fried vegetables. Vegetables in a cheese sauce. Regular canned vegetables. Regular canned tomato sauce and paste. Regular tomato and vegetable juices. Fruits Dried fruits. Canned fruit in light or heavy syrup. Fruit juice. Meat and Other Protein Products Fatty cuts of meat. Ribs, chicken wings, bacon, sausage, bologna, salami, chitterlings, fatback, hot dogs, bratwurst, and packaged luncheon meats. Salted nuts and seeds. Canned beans with salt. Dairy Whole or 2% milk, cream, half-and-half, and cream cheese. Whole-fat or sweetened yogurt. Full-fat cheeses or blue cheese. Nondairy creamers and whipped toppings. Processed cheese, cheese spreads, or cheese curds. Condiments Onion and garlic salt, seasoned salt, table salt, and sea salt. Canned and packaged gravies.  Worcestershire sauce. Tartar sauce. Barbecue sauce. Teriyaki sauce. Soy sauce, including reduced sodium. Steak sauce. Fish sauce. Oyster sauce. Cocktail sauce. Horseradish. Ketchup and mustard. Meat flavorings and tenderizers. Bouillon cubes. Hot sauce. Tabasco sauce. Marinades. Taco seasonings. Relishes. Fats and Oils Butter, stick margarine, lard, shortening, ghee, and bacon fat. Coconut, palm kernel, or palm oils. Regular salad dressings. Other Pickles and olives. Salted popcorn and pretzels. The items listed above may not be a complete list of foods and beverages to avoid. Contact your dietitian for more information. WHERE CAN I FIND MORE INFORMATION? National Heart, Lung, and Blood Institute: travelstabloid.com   This information is not intended to replace advice given to you by your health care provider. Make sure you discuss any questions you have with your health care provider.   Document Released: 10/26/2011 Document Revised: 11/27/2014 Document Reviewed: 09/10/2013 Elsevier Interactive Patient Education [employer].

## 2016-09-19 NOTE — Progress Notes (Signed)
Pt is in the office today for diabetes and hypertension Pt states she is not in any pain Pt states she is taking medications without difficulty

## 2016-09-20 ENCOUNTER — Telehealth: Payer: Self-pay

## 2016-09-20 ENCOUNTER — Encounter: Payer: Self-pay | Admitting: Internal Medicine

## 2016-09-20 DIAGNOSIS — E785 Hyperlipidemia, unspecified: Secondary | ICD-10-CM | POA: Insufficient documentation

## 2016-09-20 NOTE — Telephone Encounter (Signed)
Contacted pt to go over lab results pt didn't answer was unable to lvm due to mailbox being full

## 2016-10-03 ENCOUNTER — Other Ambulatory Visit: Payer: Self-pay | Admitting: Internal Medicine

## 2016-10-03 MED ORDER — INSULIN DETEMIR 100 UNIT/ML FLEXPEN: 65.0000 [IU] | PEN_INJECTOR | Freq: Two times a day (BID) | SUBCUTANEOUS | 3 refills | Status: DC

## 2016-10-03 MED ORDER — INSULIN ASPART 100 UNIT/ML FLEXPEN: 25.0000 [IU] | PEN_INJECTOR | Freq: Three times a day (TID) | SUBCUTANEOUS | 3 refills | Status: DC

## 2016-10-03 MED FILL — $Levemir Flexpen 100u/pen: 100 | 20 days supply | Qty: 30 | Fill #0

## 2016-10-03 MED FILL — $novoLOG FLEXPEN SYRINGE: 100 | 33 days supply | Qty: 15 | Fill #0

## 2016-11-22 ENCOUNTER — Ambulatory Visit: Payer: No Typology Code available for payment source | Admitting: Podiatry

## 2016-11-24 ENCOUNTER — Ambulatory Visit: Payer: Self-pay | Attending: Internal Medicine

## 2016-11-27 ENCOUNTER — Ambulatory Visit (AMBULATORY_SURGERY_CENTER): Payer: Self-pay | Admitting: *Deleted

## 2016-11-27 VITALS — Ht 72.0 in | Wt 220.0 lb

## 2016-11-27 DIAGNOSIS — Z1211 Encounter for screening for malignant neoplasm of colon: Secondary | ICD-10-CM

## 2016-11-27 MED ORDER — NA SULFATE-K SULFATE-MG SULF 17.5-3.13-1.6 GM/177ML PO SOLN: ORAL | 0 refills | Status: DC

## 2016-11-27 NOTE — Progress Notes (Signed)
Patient denies any allergies to eggs or soy. Patient denies any problems with anesthesia/sedation. Patient denies any oxygen use at home and does not take any diet/weight loss medications. EMMI education assisgned to patient on colonoscopy, this was explained and instructions given to patient. 

## 2016-12-11 ENCOUNTER — Ambulatory Visit (AMBULATORY_SURGERY_CENTER): Payer: Self-pay | Admitting: Gastroenterology

## 2016-12-11 ENCOUNTER — Encounter: Payer: Self-pay | Admitting: Gastroenterology

## 2016-12-11 VITALS — BP 185/85 | HR 67 | Temp 97.7°F | Resp 16 | Ht 72.0 in | Wt 220.0 lb

## 2016-12-11 DIAGNOSIS — Z1211 Encounter for screening for malignant neoplasm of colon: Secondary | ICD-10-CM

## 2016-12-11 DIAGNOSIS — K599 Functional intestinal disorder, unspecified: Secondary | ICD-10-CM

## 2016-12-11 DIAGNOSIS — D124 Benign neoplasm of descending colon: Secondary | ICD-10-CM

## 2016-12-11 DIAGNOSIS — Z1212 Encounter for screening for malignant neoplasm of rectum: Secondary | ICD-10-CM

## 2016-12-11 DIAGNOSIS — D123 Benign neoplasm of transverse colon: Secondary | ICD-10-CM

## 2016-12-11 LAB — GLUCOSE, CAPILLARY
GLUCOSE-CAPILLARY: 160 mg/dL — AB (ref 65–99)
Glucose-Capillary: 134 mg/dL — ABNORMAL HIGH (ref 65–99)

## 2016-12-11 MED ORDER — SODIUM CHLORIDE 0.9 % IV SOLN: 500.0000 mL | INTRAVENOUS | Status: DC

## 2016-12-11 NOTE — Op Note (Signed)
[facility] Patient Name: Katherine [name] Procedure Date: 12/11/2016 8:59 AM MRN: [id number] Endoscopist: Ladene Artist , MD Age: 61 Referring MD:  Date of Birth: May 04, 1956 Gender: Female Account #: [id number] Procedure:                Colonoscopy Indications:              Screening for colorectal malignant neoplasm Medicines:                Monitored Anesthesia Care Procedure:                Pre-Anesthesia Assessment:                           - Prior to the procedure, a History and Physical                            was performed, and patient medications and                            allergies were reviewed. The patient's tolerance of                            previous anesthesia was also reviewed. The risks                            and benefits of the procedure and the sedation                            options and risks were discussed with the patient.                            All questions were answered, and informed consent                            was obtained. Prior Anticoagulants: The patient has                            taken no previous anticoagulant or antiplatelet                            agents. ASA Grade Assessment: II - A patient with                            mild systemic disease. After reviewing the risks                            and benefits, the patient was deemed in                            satisfactory condition to undergo the procedure.                           After obtaining informed consent, the colonoscope  was passed under direct vision. Throughout the                            procedure, the patient's blood pressure, pulse, and                            oxygen saturations were monitored continuously. The                            Model PCF-H190L [id number]) scope was introduced                            through the anus and advanced to the the cecum,                            identified by  appendiceal orifice and ileocecal                            valve. The ileocecal valve, appendiceal orifice,                            and rectum were photographed. The quality of the                            bowel preparation was adequate. The colonoscopy was                            performed without difficulty. The patient tolerated                            the procedure well. Scope In: 9:06:45 AM Scope Out: 9:19:59 AM Scope Withdrawal Time: 0 hours 10 minutes 32 seconds  Total Procedure Duration: 0 hours 13 minutes 14 seconds  Findings:                 The perianal and digital rectal examinations were                            normal.                           A 7 mm polyp was found in the hepatic flexure. The                            polyp was sessile. The polyp was removed with a                            cold snare. Resection and retrieval were complete.                           Internal hemorrhoids were found during                            retroflexion. The hemorrhoids were small and Grade  I (internal hemorrhoids that do not prolapse).                           A few small-mouthed diverticula were found in the                            sigmoid colon and ascending colon.                           A 5 mm polyp was found in the descending colon. The                            polyp was sessile. The polyp was removed with a                            cold biopsy forceps. Resection and retrieval were                            complete.                           A localized area of mildly erythematous mucosa was                            found in the descending colon. Biopsies were taken                            with a cold forceps for histology.                           The exam was otherwise without abnormality on                            direct and retroflexion views. Complications:            No immediate complications. Estimated  blood loss:                            None. Estimated Blood Loss:     Estimated blood loss: none. Impression:               - One 7 mm polyp at the hepatic flexure, removed                            with a cold snare. Resected and retrieved.                           - Internal hemorrhoids.                           - Diverticulosis in the sigmoid colon and in the                            ascending colon.                           -  One 5 mm polyp in the descending colon, removed                            with a cold biopsy forceps. Resected and retrieved.                           - Erythematous mucosa in the descending colon.                            Biopsied.                           - The examination was otherwise normal on direct                            and retroflexion views. Recommendation:           - Repeat colonoscopy in 5 years for surveillance if                            polyp(s) precancerous, otherwise 10 years.                           - Patient has a contact number available for                            emergencies. The signs and symptoms of potential                            delayed complications were discussed with the                            patient. Return to normal activities tomorrow.                            Written discharge instructions were provided to the                            patient.                           - Resume previous diet.                           - Continue present medications.                           - Await pathology results. Ladene Artist, MD 12/11/2016 9:23:55 AM This report has been signed electronically.

## 2016-12-11 NOTE — Progress Notes (Signed)
Called to room to assist during endoscopic procedure.  Patient ID and intended procedure confirmed with present staff. Received instructions for my participation in the procedure from the performing physician.  

## 2016-12-11 NOTE — Patient Instructions (Signed)
YOU HAD AN ENDOSCOPIC PROCEDURE TODAY AT THE [facility]:   Refer to the procedure report that was given to you for any specific questions about what was found during the examination.  If the procedure report does not answer your questions, please call your gastroenterologist to clarify.  If you requested that your care partner not be given the details of your procedure findings, then the procedure report has been included in a sealed envelope for you to review at your convenience later.  YOU SHOULD EXPECT: Some feelings of bloating in the abdomen. Passage of more gas than usual.  Walking can help get rid of the air that was put into your GI tract during the procedure and reduce the bloating. If you had a lower endoscopy (such as a colonoscopy or flexible sigmoidoscopy) you may notice spotting of blood in your stool or on the toilet paper. If you underwent a bowel prep for your procedure, you may not have a normal bowel movement for a few days.  Please Note:  You might notice some irritation and congestion in your nose or some drainage.  This is from the oxygen used during your procedure.  There is no need for concern and it should clear up in a day or so.  SYMPTOMS TO REPORT IMMEDIATELY:   Following lower endoscopy (colonoscopy or flexible sigmoidoscopy):  Excessive amounts of blood in the stool  Significant tenderness or worsening of abdominal pains  Swelling of the abdomen that is new, acute  Fever of 100F or higher   For urgent or emergent issues, a gastroenterologist can be reached at any hour by calling [phone]. Please read all handouts given to you by your recovery nurse.   DIET:  We do recommend a small meal at first, but then you may proceed to your regular diet.  Drink plenty of fluids but you should avoid alcoholic beverages for 24 hours.  ACTIVITY:  You should plan to take it easy for the rest of today and you should NOT DRIVE or use heavy machinery until  tomorrow (because of the sedation medicines used during the test).    FOLLOW UP: Our staff will call the number listed on your records the next business day following your procedure to check on you and address any questions or concerns that you may have regarding the information given to you following your procedure. If we do not reach you, we will leave a message.  However, if you are feeling well and you are not experiencing any problems, there is no need to return our call.  We will assume that you have returned to your regular daily activities without incident.  If any biopsies were taken you will be contacted by phone or by letter within the next 1-3 weeks.  Please call us at [phone] if you have not heard about the biopsies in 3 weeks.    SIGNATURES/CONFIDENTIALITY: You and/or your care partner have signed paperwork which will be entered into your electronic medical record.  These signatures attest to the fact that that the information above on your After Visit Summary has been reviewed and is understood.  Full responsibility of the confidentiality of this discharge information lies with you and/or your care-partner.  Thank you for letting us take care of your healthcare needs today. 

## 2016-12-11 NOTE — Progress Notes (Signed)
Report given to PACU RN, vss 

## 2016-12-12 ENCOUNTER — Telehealth: Payer: Self-pay

## 2016-12-12 NOTE — Telephone Encounter (Signed)
  Follow up Call-  Call back number 12/11/2016  Post procedure Call Back phone  # [phone] HUSBAND NUMBER Rosser to leave phone message Yes  Some recent data might be hidden    Patient was called for follow up after her procedure on 12/11/2016. I spoke with the patients husband and he reports that Shakari has returned to her normal daily activities without any complications.

## 2016-12-18 ENCOUNTER — Ambulatory Visit: Payer: Self-pay

## 2016-12-21 ENCOUNTER — Encounter: Payer: Self-pay | Admitting: Gastroenterology

## 2016-12-26 ENCOUNTER — Ambulatory Visit: Payer: Self-pay | Attending: Internal Medicine | Admitting: Internal Medicine

## 2016-12-26 ENCOUNTER — Encounter: Payer: Self-pay | Admitting: Internal Medicine

## 2016-12-26 VITALS — BP 195/74 | HR 87 | Temp 98.3°F | Resp 16 | Wt 221.2 lb

## 2016-12-26 DIAGNOSIS — IMO0002 Reserved for concepts with insufficient information to code with codable children: Secondary | ICD-10-CM

## 2016-12-26 DIAGNOSIS — E785 Hyperlipidemia, unspecified: Secondary | ICD-10-CM | POA: Insufficient documentation

## 2016-12-26 DIAGNOSIS — Z7982 Long term (current) use of aspirin: Secondary | ICD-10-CM | POA: Insufficient documentation

## 2016-12-26 DIAGNOSIS — K029 Dental caries, unspecified: Secondary | ICD-10-CM | POA: Insufficient documentation

## 2016-12-26 DIAGNOSIS — Z79899 Other long term (current) drug therapy: Secondary | ICD-10-CM | POA: Insufficient documentation

## 2016-12-26 DIAGNOSIS — E1165 Type 2 diabetes mellitus with hyperglycemia: Secondary | ICD-10-CM

## 2016-12-26 DIAGNOSIS — Z111 Encounter for screening for respiratory tuberculosis: Secondary | ICD-10-CM

## 2016-12-26 DIAGNOSIS — Z794 Long term (current) use of insulin: Secondary | ICD-10-CM | POA: Insufficient documentation

## 2016-12-26 DIAGNOSIS — R490 Dysphonia: Secondary | ICD-10-CM | POA: Insufficient documentation

## 2016-12-26 DIAGNOSIS — I10 Essential (primary) hypertension: Secondary | ICD-10-CM | POA: Insufficient documentation

## 2016-12-26 DIAGNOSIS — E118 Type 2 diabetes mellitus with unspecified complications: Secondary | ICD-10-CM | POA: Insufficient documentation

## 2016-12-26 DIAGNOSIS — Z86010 Personal history of colonic polyps: Secondary | ICD-10-CM | POA: Insufficient documentation

## 2016-12-26 LAB — POCT GLYCOSYLATED HEMOGLOBIN (HGB A1C): Hemoglobin A1C: 11.7

## 2016-12-26 LAB — GLUCOSE, POCT (MANUAL RESULT ENTRY): POC GLUCOSE: 230 mg/dL — AB (ref 70–99)

## 2016-12-26 MED ORDER — INSULIN DETEMIR 100 UNIT/ML FLEXPEN: 75.0000 [IU] | PEN_INJECTOR | Freq: Two times a day (BID) | SUBCUTANEOUS | 3 refills | Status: DC

## 2016-12-26 MED ORDER — AMLODIPINE BESYLATE 10 MG PO TABS: 10.0000 mg | ORAL_TABLET | Freq: Every day | ORAL | 3 refills | Status: DC

## 2016-12-26 MED ORDER — LOSARTAN POTASSIUM 100 MG PO TABS: 100.0000 mg | ORAL_TABLET | Freq: Every day | ORAL | 3 refills | Status: DC

## 2016-12-26 MED ORDER — HYDRALAZINE HCL 50 MG PO TABS: 50.0000 mg | ORAL_TABLET | Freq: Three times a day (TID) | ORAL | 3 refills | Status: DC

## 2016-12-26 MED ORDER — PRAVASTATIN SODIUM 20 MG PO TABS: 20.0000 mg | ORAL_TABLET | Freq: Every day | ORAL | 3 refills | Status: DC

## 2016-12-26 MED ORDER — CARBAMIDE PEROXIDE 6.5 % OT SOLN: 5.0000 [drp] | Freq: Two times a day (BID) | OTIC | 0 refills | Status: DC

## 2016-12-26 MED ORDER — INSULIN ASPART 100 UNIT/ML FLEXPEN: 25.0000 [IU] | PEN_INJECTOR | Freq: Three times a day (TID) | SUBCUTANEOUS | 3 refills | Status: DC

## 2016-12-26 MED FILL — PRAVASTATIN NA 20 MG TAB: 20 | 30 days supply | Qty: 30 | Fill #0

## 2016-12-26 MED FILL — $novoLOG FLEXPEN SYRINGE: 100 | 32 days supply | Qty: 24 | Fill #0

## 2016-12-26 MED FILL — $Levemir Flexpen 100u/pen: 100 | 30 days supply | Qty: 45 | Fill #0

## 2016-12-26 MED FILL — LOSARTAN POTASSIUM 100 MG T: 100 | 30 days supply | Qty: 30 | Fill #0

## 2016-12-26 MED FILL — AMLODIPINE BESYLATE 10 MG T: 10 | 30 days supply | Qty: 30 | Fill #0

## 2016-12-26 MED FILL — hydrALAZINE HCL 50 MG TABS: 50 | 30 days supply | Qty: 90 | Fill #0

## 2016-12-26 MED FILL — EARWAX TREATMENT 6.5% DROPS: 6.5 | 7 days supply | Qty: 15 | Fill #0

## 2016-12-26 NOTE — Patient Instructions (Signed)
[name] /dm  And bp check   -  Check blood sugars on waking up daily., and at least 1-2 times rest of day.    Also check blood sugars about 2 hours after a meal and do this after different meals by rotation  Recommended blood sugar levels on waking up is 90-130 and about 2 hours after meal is 130-160  Please bring your blood sugar monitor to each visit, thank you  Restart exercise  Continue cholesterol med.   -- please call us if you have any questions   QUICK START PATIENT GUIDE TO LCHF/IF LOW CARB HIGH FAT / INTERMITTENT FASTING  Recommend: <50 gram carbohydrate a day for weightloss.  What is this diet and how does it work? o Insulin is a hormone made by your body that allows you to use sugar (glucose) from carbohydrates in the food you eat for energy or to store glucose (as fat) for future use  o Insulin levels need to be lowered in order to utilize our stored energy (fat) o Many struggling with obesity are insulin resistant and have high levels of insulin o This diet works to lower your insulin in two ways o Fasting - allows your insulin levels to naturally decrease  o Avoiding carbohydrates - carbs trigger increase in insulin Low Carb Healthy Fat (LCHF) o Get a free app for your phone, such as MyFitnessPal, to help you track your macronutrients (carbs/protein/fats) and to track your weight and body measurements to see your progress o Set your goal for around 10% carbs/20% protein/70% fat o A good starting goal for amount of net carbs per day is 50 grams (some will aim for 20 grams) o "Net carbs" refers to total grams of carbs minus grams of fiber (as fiber is not typically absorbed). For example, if a food has 5g total carb and 3g fiber, that would be 2g net carbs o Increase healthy fats - eg. olive oil, eggs, nuts, avocado, cheese, butter, coconut, meats, fish o Avoid high carb foods - eg. bread, pasta, potatoes, rice, cookies, soda, juice,  anything sugary o Buy full-fat ingredients (avoid low-fat versions, which often have more sugar) o No need to count calories, but pay close attention to grams of carbs on labels Intermittent Fasting (IF) o "Fasting" is going a period of time without eating - it helps to stay busy and well-hydrated o Purpose of fasting is to allow insulin levels to drop as low as possible, allowing your body to switch into fat-burning mode o With this diet there are many approaches to fasting, but 16:8 and 24hr fasts are commonly used o 16:8 fast, usually 5-7 days a week - Fasting for 16 hours of the day, then eating all meals for the day over course of 8 hours. o 24 hour fast, usually 1-3 days a week - Typically eating one meal a day, then fasting until the next day. Plenty of fluids (and some salt to help you hold onto fluids) are recommended during longer fasts.  o During fasts certain beverages are still acceptable - water, sparkling water, bone broth, black tea or coffee, or tea/coffee with small amount of heavy whipping cream Special note for those on diabetic medications o Discuss your medications with your physician. You may need to hold your medication or adjust to only taking when eating. Diabetics should keep close track of their blood sugars when making any changes to diet/meds, to ensure they are staying within normal  limits For more info about LCHF/IF o Watch "Therapeutic Fasting - Solving the Two-Compartment Problem" video by Dr. Sharman Cheek on YouTube (GreatestGyms.com.ee) for a great intro to these concepts o Read "The Obesity Code" and/or "The Complete Guide to Fasting" by Dr. Sharman Cheek o Go to www.dietdoctor.com for explanations, recipes, and infographics about foods to eat/avoid o Get a Free smartphone app that helps count carbohydrates  - ie MyKeto EXAMPLES TO GET STARTED Fasting Beverages -water (can add  tsp [place] salt once or twice a day to help stay  hydrated for longer fasts) -Sparkling water (such as [employer] or similar; avoid any with artificial sweeteners)  -Bone broth (multiple recipes available online or can buy pre-made) -Tea or Coffee (Adding heavy whipping cream or coconut oil to your tea or coffee can be helpful if you find yourself getting too hungry during the fasts. Can also add cinnamon for flavor. Or "bulletproof coffee.") Low Carb Healthy Fat Breakfast (if not fasting) -eggs in butter or olive oil with avocado -omelet with veggies and cheese  Lunch -hamburger with cheese and avocado wrapped in lettuce (no bun, no ketchup) -meat and cheese wrapped in lettuce (can dip in mustard or olive oil/vinegar/mayo) -salad with meat/cheese/nuts and higher fat dressing (vinaigrette or Ranch, etc) -tuna salad lettuce wrap -taco meat with cheese, sour cream, guacamole, cheese over lettuce  Dinner -steak with herb butter or [place] sauce -"Fathead" pizza (uses cheese and almond flour for the dough - several recipes available online) -roasted or grilled chicken with skin on, with low carb sauce (buffalo, garlic butter, alfredo, pesto, etc) -baked salmon with lemon butter -chicken alfredo with zucchini noodles -[place] butter chicken with low carb garlic naan -egg roll in a bowl  Side Dishes -mashed cauliflower (homemade or available in freezer section) -roast vegetables (green veggies that grow above ground rather than root veggies) with butter or cheese -Caprese salad (fresh mozzarella, tomato and basil with olive oil) -homemade low-carb coleslaw Snacks/Desserts (try to avoid unnecessary snacking and sweets in general) -celery or cucumber dipped in guacamole or sour cream dip -cheese and meat slices  -raspberries with whipped cream (can make homemade with no sugar added) -low carb [place] butter cake  AVOID - sugar, diet/regular soda, potatoes, breads, rice, pasta, candy, cookies, cakes, muffins, juice, high carb fruit  (bananas, grapes), beer, ketchup, barbeque and other sweet sauces  - DASH Eating Plan  Recommend 000mg  /salt daily  DASH stands for "Dietary Approaches to Stop Hypertension." The DASH eating plan is a healthy eating plan that has been shown to reduce high blood pressure (hypertension). Additional health benefits may include reducing the risk of type 2 diabetes mellitus, heart disease, and stroke. The DASH eating plan may also help with weight loss. What do I need to know about the DASH eating plan? For the DASH eating plan, you will follow these general guidelines:  Choose foods with less than 150 milligrams of sodium per serving (as listed on the food label).  Use salt-free seasonings or herbs instead of table salt or sea salt.  Check with your health care provider or pharmacist before using salt substitutes.  Eat lower-sodium products. These are often labeled as "low-sodium" or "no salt added."  Eat fresh foods. Avoid eating a lot of canned foods.  Eat more vegetables, fruits, and low-fat dairy products.  Choose whole grains. Look for the word "whole" as the first word in the ingredient list.  Choose fish and skinless chicken or [place] more often than red  meat. Limit fish, poultry, and meat to 6 oz (170 g) each day.  Limit sweets, desserts, sugars, and sugary drinks.  Choose heart-healthy fats.  Eat more home-cooked food and less restaurant, buffet, and fast food.  Limit fried foods.  Do not fry foods. Cook foods using methods such as baking, boiling, grilling, and broiling instead.  When eating at a restaurant, ask that your food be prepared with less salt, or no salt if possible. What foods can I eat? Seek help from a dietitian for individual calorie needs. Grains  Whole grain or whole wheat bread. Brown rice. Whole grain or whole wheat pasta. Quinoa, bulgur, and whole grain cereals. Low-sodium cereals. Corn or whole wheat flour tortillas. Whole grain cornbread. Whole  grain crackers. Low-sodium crackers. Vegetables  Fresh or frozen vegetables (raw, steamed, roasted, or grilled). Low-sodium or reduced-sodium tomato and vegetable juices. Low-sodium or reduced-sodium tomato sauce and paste. Low-sodium or reduced-sodium canned vegetables. Fruits  All fresh, canned (in natural juice), or frozen fruits. Meat and Other Protein Products  Ground beef (85% or leaner), grass-fed beef, or beef trimmed of fat. Skinless chicken or [place]. Ground chicken or [place]. Pork trimmed of fat. All fish and seafood. Eggs. Dried beans, peas, or lentils. Unsalted nuts and seeds. Unsalted canned beans. Dairy  Low-fat dairy products, such as skim or 1% milk, 2% or reduced-fat cheeses, low-fat ricotta or cottage cheese, or plain low-fat yogurt. Low-sodium or reduced-sodium cheeses. Fats and Oils  Tub margarines without trans fats. Light or reduced-fat mayonnaise and salad dressings (reduced sodium). Avocado. Safflower, olive, or canola oils. Natural peanut or almond butter. Other  Unsalted popcorn and pretzels. The items listed above may not be a complete list of recommended foods or beverages. Contact your dietitian for more options.  What foods are not recommended? Grains  White bread. White pasta. White rice. Refined cornbread. Bagels and croissants. Crackers that contain trans fat. Vegetables  Creamed or fried vegetables. Vegetables in a cheese sauce. Regular canned vegetables. Regular canned tomato sauce and paste. Regular tomato and vegetable juices. Fruits  Canned fruit in light or heavy syrup. Fruit juice. Meat and Other Protein Products  Fatty cuts of meat. Ribs, chicken wings, bacon, sausage, bologna, salami, chitterlings, fatback, hot dogs, bratwurst, and packaged luncheon meats. Salted nuts and seeds. Canned beans with salt. Dairy  Whole or 2% milk, cream, half-and-half, and cream cheese. Whole-fat or sweetened yogurt. Full-fat cheeses or blue cheese. Nondairy creamers and  whipped toppings. Processed cheese, cheese spreads, or cheese curds. Condiments  Onion and garlic salt, seasoned salt, table salt, and sea salt. Canned and packaged gravies. Worcestershire sauce. Tartar sauce. Barbecue sauce. Teriyaki sauce. Soy sauce, including reduced sodium. Steak sauce. Fish sauce. Oyster sauce. Cocktail sauce. Horseradish. Ketchup and mustard. Meat flavorings and tenderizers. Bouillon cubes. Hot sauce. Tabasco sauce. Marinades. Taco seasonings. Relishes. Fats and Oils  Butter, stick margarine, lard, shortening, ghee, and bacon fat. Coconut, palm kernel, or palm oils. Regular salad dressings. Other  Pickles and olives. Salted popcorn and pretzels. The items listed above may not be a complete list of foods and beverages to avoid. Contact your dietitian for more information.  Where can I find more information? National Heart, Lung, and Blood Institute: travelstabloid.com This information is not intended to replace advice given to you by your health care provider. Make sure you discuss any questions you have with your health care provider. Document Released: 10/26/2011 Document Revised: 04/13/2016 Document Reviewed: 09/10/2013 Elsevier Interactive Patient Education  2017 [employer].

## 2016-12-26 NOTE — Progress Notes (Signed)
Katherine [name], is a 61 y.o. female  [id number]  [id number]  DOB - 13-Dec-1955  Chief Complaint  Patient presents with  . Hypertension  . Diabetes        Subjective:   Katherine [name] is a 61 y.o. female here today for a follow up visit htn and dm.  Last seen 09/19/16 when I increased her hydralazine from 25 tid to 50tid. Howerver, pt is still taking hydralazine 25tid, still taking losartan 100 and norvasc 10.  She is watching her salt intake much more.  In regards to dm, she is not watching her carb intake as much. Has given up breads, but still eating a lot of crackers and pasta. Am cbg per pt >200s, and throughout day stays in 250 range.  Pt has been noticing her voice has become hoarser and deeper. Requested ent visit.;  Patient has No headache, No chest pain, No abdominal pain - No Nausea, No new weakness tingling or numbness, No Cough - SOB.  No problems updated.  ALLERGIES: Allergies  Allergen Reactions  . Other Swelling    Seaweed= swelling on arms, hands and face    PAST MEDICAL HISTORY: Past Medical History:  Diagnosis Date  . Boil of buttock   . Diabetes mellitus   . Hyperlipidemia   . Hypertension     MEDICATIONS AT HOME: Prior to Admission medications   Medication Sig Start Date End Date Taking? Authorizing Provider  amLODipine (NORVASC) 10 MG tablet Take 1 tablet (10 mg total) by mouth daily. 12/26/16   Maren Reamer, MD  aspirin (ASPIRIN CHILDRENS) 81 MG chewable tablet Chew 1 tablet (81 mg total) by mouth daily. 09/19/16   Maren Reamer, MD  Blood Glucose Monitoring Suppl (TRUE METRIX METER) W/DEVICE KIT USE AS INSTRUCTED 10/22/15   Lance Bosch, NP  carbamide peroxide (DEBROX) 6.5 % otic solution Place 5 drops into the right ear 2 (two) times daily. 12/26/16   Maren Reamer, MD  chlorhexidine (PERIDEX) 0.12 % solution Use as directed 15 mLs in the mouth or throat 2 (two) times daily. 01/12/16   Lance Bosch, NP  gabapentin (NEURONTIN) 300  MG capsule Take 1 capsule (300 mg total) by mouth 3 (three) times daily. 09/19/16   Maren Reamer, MD  glucose blood (TRUE METRIX BLOOD GLUCOSE TEST) test strip Use as instructed 10/22/15   Lance Bosch, NP  hydrALAZINE (APRESOLINE) 50 MG tablet Take 1 tablet (50 mg total) by mouth 3 (three) times daily. 12/26/16   Maren Reamer, MD  insulin aspart (NOVOLOG FLEXPEN) 100 UNIT/ML FlexPen Inject 25 Units into the skin 3 (three) times daily with meals. 12/26/16   Maren Reamer, MD  Insulin Detemir (LEVEMIR FLEXTOUCH) 100 UNIT/ML Pen Inject 75 Units into the skin 2 (two) times daily. 12/26/16   Maren Reamer, MD  Insulin Pen Needle (ULTICARE MICRO PEN NEEDLES) 32G X 4 MM MISC USE AS DIRECTED 02/09/16   Lance Bosch, NP  Insulin Syringe-Needle U-100 28G X 1/2" 0.3 ML MISC Check blood sugar TID & QHS 04/08/15   Lorayne Marek, MD  loratadine (CLARITIN) 10 MG tablet Take 1 tablet (10 mg total) by mouth daily. Patient not taking: Reported on 11/27/2016 08/23/16   Maren Reamer, MD  losartan (COZAAR) 100 MG tablet Take 1 tablet (100 mg total) by mouth daily. 12/26/16   Maren Reamer, MD  meclizine (ANTIVERT) 12.5 MG tablet Take 1 tablet (12.5 mg total) by mouth 3 (three)  times daily as needed for dizziness. Patient not taking: Reported on 11/27/2016 02/23/16   Okey Regal, PA-C  OVER THE COUNTER MEDICATION Take 1 tablet by mouth daily. Over the counter supplement with iron and other ingredients    Historical Provider, MD  oxyCODONE-acetaminophen (PERCOCET) 5-325 MG tablet Take 1-2 tablets by mouth every 6 (six) hours as needed. Patient not taking: Reported on 11/27/2016 06/25/16   Veryl Speak, MD  pravastatin (PRAVACHOL) 20 MG tablet Take 1 tablet (20 mg total) by mouth daily. 12/26/16   Maren Reamer, MD  Pyridoxine HCl (VITAMIN B-6 PO) Take 1 tablet by mouth daily.    Historical Provider, MD     Objective:   Vitals:   12/26/16 1208  BP: (!) 195/74  Pulse: 87  Resp: 16  Temp: 98.3 F (36.8 C)    TempSrc: Oral  SpO2: 97%  Weight: 221 lb 3.2 oz (100.3 kg)    Exam General appearance : Awake, alert, not in any distress. Speech Clear. Not toxic looking, obese, pleasant. HEENT: Atraumatic and Normocephalic, pupils equally reactive to light.  Left tm clear, mild ceruminosis right ear, but tm is clear. Neck: supple, no JVD. Chest:Good air entry bilaterally, no added sounds. CVS: S1 S2 regular, no murmurs/gallups or rubs. Abdomen: Bowel sounds active, obese, Non tender and not distended with no gaurding, rigidity or rebound. Extremities: B/L Lower Ext shows no edema, both legs are warm to touch Neurology: Awake alert, and oriented X 3, CN II-XII grossly intact, Non focal Skin:No Rash  Data Review Lab Results  Component Value Date   HGBA1C 11.7 12/26/2016   HGBA1C 12.4 07/25/2016   HGBA1C 13.1 01/12/2016    Depression screen PHQ 2/9 12/26/2016 09/19/2016 07/25/2016 02/02/2016  Decreased Interest _0 0  Down, Depressed, Hopeless _1 0  PHQ - 2 Score _2 0  Altered sleeping _3 -  Tired, decreased energy _4 -  Change in appetite _5 -  Feeling bad or failure about yourself  1 0 1 -  Trouble concentrating _6 -  Moving slowly or fidgety/restless 0 1 0 -  Suicidal thoughts 0 0 0 -  PHQ-9 Score _7 -      Assessment & Plan   1. Uncontrolled type 2 diabetes mellitus with complication, with long-term current use of insulin ([place]) - very slow progress, recd low carb diet, needs to carb count., recd increase exercise - low carb diet info provided, instructed her that if she really does restrict her carb intake, she may start needed less insulin. - she is currently on levemir 70bid, increased it to 75bid, continue novolog 25tid ac. - POCT glucose (manual entry) - POCT glycosylated hemoglobin (Hb A1C)  11.7 (from 12.4) - Ambulatory referral to Ophthalmology, has orange card, told her may not cover.  - she could not tol metformin xr either due to nausea, added to  allergy list - [facility] for dm and bp check  2. HYPERTENSION, BENIGN ESSENTIAL - pt did not take the increase hydralazine that was started in Oct. - advised her to start doubling her dose w/ her current hydralazine 93m pills, 2pills tid until she picks up new rx - low salt diet advised. - hydrALAZINE (APRESOLINE) 50 MG tablet; Take 1 tablet (50 mg total) by mouth 3 (three) times daily.  Dispense: 120 tablet; Refill: 3 - continue same norvasc 10 and cozaar [id number] - losartan (  COZAAR) 100 MG tablet; Take 1 tablet (100 mg total) by mouth daily.  Dispense: 30 tablet; Refill: 3 - amLODipine (NORVASC) 10 MG tablet; Take 1 tablet (10 mg total) by mouth daily.  Dispense: 90 tablet; Refill: 3 - - bp check w/ [facility] in 2 wks, if sbp remains uncontrolled, would recd considering hyzaar combo.  3. Visit for TB skin test - PPD  4. Dental cavities - Ambulatory referral to Dentistry  5. Recent colonoscopy, 12/11/16, w/ polyps noted, repeat in 5years.  6. Hoarse voice,  No prior tob, etiology unclear. ent cs placed. Denies gi/gerd sx.  Patient have been counseled extensively about nutrition and exercise  Return in about 3 months (around 03/25/2017), or if symptoms worsen or fail to improve.  The patient was given clear instructions to go to ER or return to medical center if symptoms don't improve, worsen or new problems develop. The patient verbalized understanding. The patient was told to call to get lab results if they haven't heard anything in the next week.   This note has been created with Surveyor, quantity. Any transcriptional errors are unintentional.   Maren Reamer, MD, [place] [facility] [place], [place]   12/26/2016, 1:03 PM

## 2016-12-29 ENCOUNTER — Ambulatory Visit: Payer: Self-pay | Attending: Internal Medicine | Admitting: *Deleted

## 2016-12-29 DIAGNOSIS — Z0000 Encounter for general adult medical examination without abnormal findings: Secondary | ICD-10-CM

## 2016-12-29 LAB — TB SKIN TEST: TB SKIN TEST: NEGATIVE

## 2016-12-29 NOTE — Progress Notes (Signed)
PPD Reading Note PPD read and results entered in [place]. Result: no mm induration. Interpretation: negative   Allergic reaction: no

## 2017-01-03 ENCOUNTER — Ambulatory Visit: Payer: Self-pay | Admitting: Podiatry

## 2017-01-03 DIAGNOSIS — M79609 Pain in unspecified limb: Secondary | ICD-10-CM

## 2017-01-03 DIAGNOSIS — L608 Other nail disorders: Secondary | ICD-10-CM

## 2017-01-03 DIAGNOSIS — B351 Tinea unguium: Secondary | ICD-10-CM

## 2017-01-03 DIAGNOSIS — L603 Nail dystrophy: Secondary | ICD-10-CM

## 2017-01-03 DIAGNOSIS — E0843 Diabetes mellitus due to underlying condition with diabetic autonomic (poly)neuropathy: Secondary | ICD-10-CM

## 2017-01-03 NOTE — Progress Notes (Signed)
   SUBJECTIVE Patient with a history of diabetes mellitus presents to office today complaining of elongated, thickened nails. Pain while ambulating in shoes. Patient is unable to trim their own nails.   OBJECTIVE General Patient is awake, alert, and oriented x 3 and in no acute distress. Derm Skin is dry and supple bilateral. Negative open lesions or macerations. Remaining integument unremarkable. Nails are tender, long, thickened and dystrophic with subungual debris, consistent with onychomycosis, 1-5 bilateral. No signs of infection noted. Vasc  DP and PT pedal pulses palpable bilaterally. Temperature gradient within normal limits.  Neuro Epicritic and protective threshold sensation diminished bilaterally.  Musculoskeletal Exam No symptomatic pedal deformities noted bilateral. Muscular strength within normal limits.  ASSESSMENT 1. Diabetes Mellitus w/ peripheral neuropathy 2. Onychomycosis of nail due to dermatophyte bilateral 3. Pain in foot bilateral  PLAN OF CARE 1. Patient evaluated today. 2. Instructed to maintain good pedal hygiene and foot care. Stressed importance of controlling blood sugar.  3. Mechanical debridement of nails 1-5 bilaterally performed using a nail nipper. Filed with dremel without incident.  4. Recommend increasing gabapentin 100 mg to 3 times daily.  5. Return to clinic in 3 mos.     Edrick Kins, DPM [facility]  Dr. Edrick Kins, [address]                                        [place], [place] [address]                Office [phone]  Fax [phone]

## 2017-01-11 ENCOUNTER — Encounter: Payer: Self-pay | Admitting: Pharmacist

## 2017-01-11 ENCOUNTER — Other Ambulatory Visit: Payer: Self-pay | Admitting: Internal Medicine

## 2017-01-11 ENCOUNTER — Ambulatory Visit: Payer: Self-pay | Attending: Internal Medicine | Admitting: Pharmacist

## 2017-01-11 VITALS — BP 162/78 | HR 78

## 2017-01-11 DIAGNOSIS — E118 Type 2 diabetes mellitus with unspecified complications: Secondary | ICD-10-CM

## 2017-01-11 DIAGNOSIS — E1165 Type 2 diabetes mellitus with hyperglycemia: Secondary | ICD-10-CM

## 2017-01-11 DIAGNOSIS — E1143 Type 2 diabetes mellitus with diabetic autonomic (poly)neuropathy: Secondary | ICD-10-CM | POA: Insufficient documentation

## 2017-01-11 DIAGNOSIS — Z794 Long term (current) use of insulin: Secondary | ICD-10-CM | POA: Insufficient documentation

## 2017-01-11 DIAGNOSIS — IMO0002 Reserved for concepts with insufficient information to code with codable children: Secondary | ICD-10-CM

## 2017-01-11 DIAGNOSIS — Z87440 Personal history of urinary (tract) infections: Secondary | ICD-10-CM | POA: Insufficient documentation

## 2017-01-11 DIAGNOSIS — I10 Essential (primary) hypertension: Secondary | ICD-10-CM | POA: Insufficient documentation

## 2017-01-11 DIAGNOSIS — K3184 Gastroparesis: Secondary | ICD-10-CM | POA: Insufficient documentation

## 2017-01-11 DIAGNOSIS — Z79899 Other long term (current) drug therapy: Secondary | ICD-10-CM | POA: Insufficient documentation

## 2017-01-11 MED ORDER — METFORMIN HCL ER 500 MG PO TB24: 1000.0000 mg | ORAL_TABLET | Freq: Two times a day (BID) | ORAL | 0 refills | Status: DC

## 2017-01-11 MED ORDER — HYDROCHLOROTHIAZIDE 25 MG PO TABS: 25.0000 mg | ORAL_TABLET | Freq: Every day | ORAL | 2 refills | Status: DC

## 2017-01-11 NOTE — Progress Notes (Signed)
S:    Patient arrives in good spirits.    Presents to the clinic for hypertension evaluation. Patient was referred on 12/26/16.  Patient was last seen by Primary Care Provider on 12/26/16.   Patient reports adherence with medications.  Current BP Medications include:  Amlodipine 10 mg daily, hydralazine 50 mg TID, losartan 100 mg daily  Antihypertensives tried in the past include: HCTZ, lisinopril, olmesartan, valsartan  Current diabetes medications include: Levemir 75 units BID, Novolog 25 units TID  Dietary habits include: patient has been trying to cut out salt and feels like she is doing a good job of this. She is not doing a great job of cutting out carbs but would like to so that she can lose weight.  Her husband recently was taken off of hydralazine and she would like to come off of it if she could at some point.   Patient reports feeling depressed. Denies SI/HI. Would like to talk to someone about it.  O:   Last 3 Office BP readings: BP Readings from Last 3 Encounters:  01/11/17 (!) 162/78  12/26/16 (!) 195/74  12/11/16 (!) 185/85    BMET    Component Value Date/Time   NA 133 (L) 06/25/2016 2017   K 3.8 06/25/2016 2017   CL 97 (L) 06/25/2016 2017   CO2 27 06/25/2016 2017   GLUCOSE 513 (HH) 06/25/2016 2017   BUN 11 06/25/2016 2017   CREATININE 0.91 06/25/2016 2017   CREATININE 0.88 09/29/2015 1712   CALCIUM 9.0 06/25/2016 2017   GFRNONAA >60 06/25/2016 2017   GFRNONAA >89 01/06/2015 1621   GFRAA >60 06/25/2016 2017   GFRAA >89 01/06/2015 1621   Home fasting CBGs: 200s Home post-prandial CBGs: 200s-300s  Lab Results  Component Value Date   HGBA1C 11.7 12/26/2016     A/P: Hypertension longstanding currently UNcontrolled on current medications.  Continued current medications and start hydrochlorothiazide 25 mg daily - if patient tolerates will order as combo pill with losartan at next visit. Potassium was 3.8 on an ACEi (without HCTZ) which could indicate  possible aldosterone involvement in her hypertension so would consider spironolactone if we still cannot control her blood pressure. Of note, she was on this in the past, but it was stopped and patient wasn't sure why. Will try to look into this more. Will obtain BMET.   Type 2 diabetes longstanding currently UNcontrolled based on A1c of 11.7 on current medications. She has a listed allergy to metformin as nausea but she still has it so she is ok to try the metformin again. GLP-1 is a viable next step for DM control and weight loss, but don't want to add it at the same time as the metformin as both can cause GI upset. Patient also has history of gastroparesis which could complicate the use of GLP-1 due to GI upset. Due to history of repeat UTI and DKA admissions, would avoid SGLT-2 for now and use other medications instead. Could consider DPP-IV. Will see how she does with metformin first and will determine at next visit.  Patient given Christa See (LCSW)'s contact information to set up visit for counseling. Patient will set up appointment. No concern for acute danger to patient and all recent PHQ-9 scores have been stable between 10-12. Patient to also follow up with Dr. Janne Napoleon.  Results reviewed and written information provided.   Total time in face-to-face counseling 30 minutes.   F/U Clinic Visit with me in 2 weeks.  Patient seen with  Windell Moulding, PharmD Candidate

## 2017-01-11 NOTE — Patient Instructions (Addendum)
Thanks for coming to see [place]!  Start hydrochlorothiazide 25 mg daily for your blood pressure  Start metformin XL 500 mg twice daily with meals and then increase to 2 tablets twice daily with meals after 1 week.  Come back in 2 weeks

## 2017-01-12 MED FILL — CHLORHEXIDINE 0.12% RINSE: 0.12 | 15 days supply | Qty: 473 | Fill #0

## 2017-01-12 MED FILL — HYDROCHLOROTHIAZIDE 25 MG T: 25 | 30 days supply | Qty: 30 | Fill #0

## 2017-01-12 MED FILL — METFORMIN HCL ER 500 MG TAB: 500 | 30 days supply | Qty: 120 | Fill #0

## 2017-01-23 ENCOUNTER — Ambulatory Visit: Payer: Self-pay | Admitting: Pharmacist

## 2017-02-06 ENCOUNTER — Ambulatory Visit: Payer: Self-pay | Attending: Internal Medicine | Admitting: Pharmacist

## 2017-02-06 VITALS — BP 146/90 | HR 70

## 2017-02-06 DIAGNOSIS — E1165 Type 2 diabetes mellitus with hyperglycemia: Secondary | ICD-10-CM | POA: Insufficient documentation

## 2017-02-06 DIAGNOSIS — Z79899 Other long term (current) drug therapy: Secondary | ICD-10-CM | POA: Insufficient documentation

## 2017-02-06 DIAGNOSIS — Z794 Long term (current) use of insulin: Secondary | ICD-10-CM | POA: Insufficient documentation

## 2017-02-06 DIAGNOSIS — I10 Essential (primary) hypertension: Secondary | ICD-10-CM | POA: Insufficient documentation

## 2017-02-06 DIAGNOSIS — E785 Hyperlipidemia, unspecified: Secondary | ICD-10-CM | POA: Insufficient documentation

## 2017-02-06 DIAGNOSIS — Z7982 Long term (current) use of aspirin: Secondary | ICD-10-CM | POA: Insufficient documentation

## 2017-02-06 DIAGNOSIS — E118 Type 2 diabetes mellitus with unspecified complications: Secondary | ICD-10-CM

## 2017-02-06 DIAGNOSIS — IMO0002 Reserved for concepts with insufficient information to code with codable children: Secondary | ICD-10-CM

## 2017-02-06 LAB — GLUCOSE, POCT (MANUAL RESULT ENTRY): POC Glucose: 302 mg/dl — AB (ref 70–99)

## 2017-02-06 MED ORDER — HYDRALAZINE HCL 100 MG PO TABS: 100.0000 mg | ORAL_TABLET | Freq: Three times a day (TID) | ORAL | 2 refills | Status: DC

## 2017-02-06 MED ORDER — INSULIN GLARGINE 100 UNIT/ML SOLOSTAR PEN: 75.0000 [IU] | PEN_INJECTOR | Freq: Two times a day (BID) | SUBCUTANEOUS | 2 refills | Status: DC

## 2017-02-06 MED ORDER — CLONIDINE HCL 0.1 MG PO TABS
0.1000 mg | ORAL_TABLET | Freq: Once | ORAL | Status: AC
  Administered 2017-02-06: 0.1 mg via ORAL

## 2017-02-06 NOTE — Progress Notes (Signed)
Katherine [name], is a 60 y.o. female  [id number]:[id number]  [id number]  DOB - March 02, 1956  CC: No chief complaint on file.      HPI: Katherine [name] is a 61 y.o. female here today to fu w/ Stacey, clinical pharmacist. Noted to be very hypertensive, hyperglycemic, and c/o of gen malaise.  I was asked to eval pt further.    Pt c/o of diffuse fatigue, but no specific extremity weakness.  Pt denies n/v/d. Denies dysarthria.  No sob/doe.  Patient has No headache, No chest pain, No abdominal pain - No Nausea, No new weakness tingling or numbness, No Cough - SOB.    Review of Systems: Per hpi, o/w all systems reviewed and neg.    Allergies  Allergen Reactions  . Other Swelling    Seaweed= swelling on arms, hands and face  . Metformin And Related Nausea Only   Past Medical History:  Diagnosis Date  . Boil of buttock   . Diabetes mellitus   . Hyperlipidemia   . Hypertension    Current Outpatient Prescriptions on File Prior to Visit  Medication Sig Dispense Refill  . amLODipine (NORVASC) 10 MG tablet Take 1 tablet (10 mg total) by mouth daily. 90 tablet 3  . aspirin (ASPIRIN CHILDRENS) 81 MG chewable tablet Chew 1 tablet (81 mg total) by mouth daily. 90 tablet 3  . Blood Glucose Monitoring Suppl (TRUE METRIX METER) W/DEVICE KIT USE AS INSTRUCTED 1 kit 0  . carbamide peroxide (DEBROX) 6.5 % otic solution Place 5 drops into the right ear 2 (two) times daily. 15 mL 0  . chlorhexidine (PERIDEX) 0.12 % solution SWISH IN MOUTH 15 MLS FOR 30 SECONDS THEN SPIT OUT. USE 2 TIMES DAILY AFTER BREAKFAST AND AT BEDTIME 473 mL 0  . gabapentin (NEURONTIN) 300 MG capsule Take 1 capsule (300 mg total) by mouth 3 (three) times daily. 90 capsule 3  . glucose blood (TRUE METRIX BLOOD GLUCOSE TEST) test strip Use as instructed 100 each 12  . hydrALAZINE (APRESOLINE) 50 MG tablet Take 1 tablet (50 mg total) by mouth 3 (three) times daily. 120 tablet 3  . hydrochlorothiazide (HYDRODIURIL) 25 MG tablet Take 1  tablet (25 mg total) by mouth daily. 30 tablet 2  . insulin aspart (NOVOLOG FLEXPEN) 100 UNIT/ML FlexPen Inject 25 Units into the skin 3 (three) times daily with meals. 15 mL 3  . Insulin Detemir (LEVEMIR FLEXTOUCH) 100 UNIT/ML Pen Inject 75 Units into the skin 2 (two) times daily. 15 mL 3  . Insulin Pen Needle (ULTICARE MICRO PEN NEEDLES) 32G X 4 MM MISC USE AS DIRECTED 100 each 12  . Insulin Syringe-Needle U-100 28G X 1/2" 0.3 ML MISC Check blood sugar TID & QHS 100 each 5  . loratadine (CLARITIN) 10 MG tablet Take 1 tablet (10 mg total) by mouth daily. (Patient not taking: Reported on 11/27/2016) 30 tablet 11  . losartan (COZAAR) 100 MG tablet Take 1 tablet (100 mg total) by mouth daily. 30 tablet 3  . meclizine (ANTIVERT) 12.5 MG tablet Take 1 tablet (12.5 mg total) by mouth 3 (three) times daily as needed for dizziness. (Patient not taking: Reported on 11/27/2016) 30 tablet 0  . metFORMIN (GLUCOPHAGE-XR) 500 MG 24 hr tablet Take 2 tablets (1,000 mg total) by mouth 2 (two) times daily with a meal. 120 tablet 0  . OVER THE COUNTER MEDICATION Take 1 tablet by mouth daily. Over the counter supplement with iron and other ingredients    . oxyCODONE-acetaminophen (PERCOCET)  5-325 MG tablet Take 1-2 tablets by mouth every 6 (six) hours as needed. (Patient not taking: Reported on 11/27/2016) 15 tablet 0  . pravastatin (PRAVACHOL) 20 MG tablet Take 1 tablet (20 mg total) by mouth daily. 90 tablet 3  . Pyridoxine HCl (VITAMIN B-6 PO) Take 1 tablet by mouth daily.    . [DISCONTINUED] sitaGLIPtin (JANUVIA) 100 MG tablet Take 100 mg by mouth daily.     Current Facility-Administered Medications on File Prior to Visit  Medication Dose Route Frequency Provider Last Rate Last Dose  . 0.9 %  sodium chloride infusion  500 mL Intravenous Continuous Ladene Artist, MD       Family History  Problem Relation Age of Onset  . Diabetes Father   . Hypertension Father   . Kidney disease Mother   . Hypertension Mother     . Cancer Maternal Grandmother   . Hypertension Maternal Grandfather   . Colon cancer Neg Hx    Social History   Social History  . Marital status: Married    Spouse name: N/A  . Number of children: N/A  . Years of education: N/A   Occupational History  . Not on file.   Social History Main Topics  . Smoking status: Never Smoker  . Smokeless tobacco: Never Used  . Alcohol use No  . Drug use: No  . Sexual activity: Not on file   Other Topics Concern  . Not on file   Social History Narrative  . No narrative on file    Objective:   Vitals:   02/06/17 1616  BP: (!) 190/110  Pulse: 70    There were no vitals filed for this visit.  BP Readings from Last 3 Encounters:  02/06/17 (!) 190/110  01/11/17 (!) 162/78  12/26/16 (!) 195/74    Physical Exam: Constitutional: Patient appears well-developed and well-nourished. No distress. AAOx3 HENT: Normocephalic, atraumatic, External right and left ear normal. Oropharynx is clear and moist.  toungue midline.   Eyes: Conjunctivae and EOMI are normal. PERRL, no scleral icterus. Neck: Normal ROM. Neck supple. No JVD.  CVS: RRR, S1/S2 +, no murmurs, no gallops, no carotid bruit.  Pulmonary: Effort and breath sounds normal, no stridor, rhonchi, wheezes, rales.  Abdominal: Soft. BS +, nttp. Musculoskeletal: Normal range of motion. No edema and no tenderness.  LE: bilat/ no c/c/e, pulses 2+ bilateral. Neuro: Alert.  muscle tone coordination wnl. cn2 - 12 intact.  Ms 5/5 ue and le. Tongue midline. Speech intact, no facial droop noted. Skin: Skin is warm and dry. No rash noted. Not diaphoretic. No erythema. No pallor. Psychiatric: Normal mood and affect. Behavior, judgment, thought content normal.  Lab Results  Component Value Date   WBC 6.9 06/25/2016   HGB 14.5 06/25/2016   HCT 44.4 06/25/2016   MCV 86.2 06/25/2016   PLT 231 06/25/2016   Lab Results  Component Value Date   CREATININE 0.91 06/25/2016   BUN 11 06/25/2016    NA 133 (L) 06/25/2016   K 3.8 06/25/2016   CL 97 (L) 06/25/2016   CO2 27 06/25/2016    Lab Results  Component Value Date   HGBA1C 11.7 12/26/2016   Lipid Panel     Component Value Date/Time   CHOL 205 (H) 09/19/2016 1452   TRIG 179 (H) 09/19/2016 1452   HDL 54 09/19/2016 1452   CHOLHDL 3.8 09/19/2016 1452   VLDL 36 (H) 09/19/2016 1452   LDLCALC 115 09/19/2016 1452  Depression screen [facility] 2/9 12/26/2016 09/19/2016 07/25/2016 02/02/2016  Decreased Interest '2 1 2 '$ 0  Down, Depressed, Hopeless '1 1 2 '$ 0  PHQ - 2 Score '3 2 4 '$ 0  Altered sleeping '2 3 1 '$ -  Tired, decreased energy '1 3 3 '$ -  Change in appetite '2 1 2 '$ -  Feeling bad or failure about yourself  1 0 1 -  Trouble concentrating '1 1 1 '$ -  Moving slowly or fidgety/restless 0 1 0 -  Suicidal thoughts 0 0 0 -  PHQ-9 Score '10 11 12 '$ -    Assessment and plan:   1. Uncontrolled type 2 diabetes mellitus with complication, with long-term current use of insulin (HCC) - hyperglycemia,  - Glucose (CBG) - Stacey - pharm to readjust  2. Malignant htn, uncontrolled - Stacey pharm to readjust meds. - no s/se of acs/cva sx at this time.  - fu up w/ me in 2 wks.  No Follow-up on file.  The patient was given clear instructions to go to ER or return to medical center if symptoms don't improve, worsen or new problems develop. The patient verbalized understanding. The patient was told to call to get lab results if they haven't heard anything in the next week.    This note has been created with Surveyor, quantity. Any transcriptional errors are unintentional.   Maren Reamer, MD, [place], [place]   02/06/2017, 4:23 PM

## 2017-02-06 NOTE — Patient Instructions (Addendum)
Thanks for coming to see [place]!  I have ordered the Lantus pen to replace the Levemir to see if that helps with the bumps. It is used the same way and take the same dose you have been taking of the Levemir: 75 units twice daily.  Increase Hydralazine to 100 mg three times daily.  See Dr. Janne Napoleon in 1-2 weeks.

## 2017-02-06 NOTE — Progress Notes (Addendum)
   S:    Patient arrives in good spirits.    Presents to the clinic for hypertension evaluation. Patient was referred on 12/26/16.  Patient was last seen by Primary Care Provider on 12/26/16.   Patient reports adherence with medications except for Levemir  Current BP Medications include:  Amlodipine 10 mg daily, hydralazine 50 mg TID, losartan 100 mg daily, hydrochlorothiazide 25 mg daily.  Antihypertensives tried in the past include: , lisinopril, olmesartan, valsartan  Current diabetes medications include: Levemir 75 units BID, Novolog 25 units TID. Patient reports that she wants to stop her Levemir due to bumps it is causing on her stomach.     O:   Last 3 Office BP readings: BP Readings from Last 3 Encounters:  01/11/17 (!) 162/78  12/26/16 (!) 195/74  12/11/16 (!) 185/85    BMET    Component Value Date/Time   NA 133 (L) 06/25/2016 2017   K 3.8 06/25/2016 2017   CL 97 (L) 06/25/2016 2017   CO2 27 06/25/2016 2017   GLUCOSE 513 (HH) 06/25/2016 2017   BUN 11 06/25/2016 2017   CREATININE 0.91 06/25/2016 2017   CREATININE 0.88 09/29/2015 1712   CALCIUM 9.0 06/25/2016 2017   GFRNONAA >60 06/25/2016 2017   GFRNONAA >89 01/06/2015 1621   GFRAA >60 06/25/2016 2017   GFRAA >89 01/06/2015 1621   Home fasting CBGs: 140s-160s Home post-prandial CBGs: 200s-300s  Lab Results  Component Value Date   HGBA1C 11.7 12/26/2016     A/P: Hypertension longstanding currently UNcontrolled on current medications.  Elevated blood pressure in office so clonidine 0.1 mg x 1 was given. Increase hydralazine to 100 mg TID. Patient assessed by Dr. Janne Napoleon and will follow up with her in 2 weeks.  Type 2 diabetes longstanding currently UNcontrolled based on A1c of 11.7 on current medications. Changed Levemir to Lantus to see if she is possibly reacting to a component of Levemir since she does not have this reaction with the Novolog. Encouraged patient to keep checking her blood sugars and that she  needs to take her insulin as instructed and to not miss any doses. Will not make any other changes at this time until we can see the impact of her twice daily use of Levemir/Lantus.   Results reviewed and written information provided.   Total time in face-to-face counseling 30 minutes.   F/U Clinic Visit with Dr. Janne Napoleon in 2 weeks.  Patient seen with Maryan Char, PharmD Candidate

## 2017-02-07 MED FILL — hydrALAZINE HCL 100 MG TABS: 100 | 30 days supply | Qty: 90 | Fill #0

## 2017-02-07 MED FILL — $Levemir Flexpen 100u/pen: 100 | 10 days supply | Qty: 15 | Fill #1

## 2017-02-13 ENCOUNTER — Ambulatory Visit: Payer: Self-pay | Attending: Internal Medicine | Admitting: Internal Medicine

## 2017-02-13 ENCOUNTER — Encounter: Payer: Self-pay | Admitting: Internal Medicine

## 2017-02-13 VITALS — BP 192/94 | HR 83 | Temp 97.6°F | Resp 16 | Wt 230.6 lb

## 2017-02-13 DIAGNOSIS — Z7982 Long term (current) use of aspirin: Secondary | ICD-10-CM | POA: Insufficient documentation

## 2017-02-13 DIAGNOSIS — Z79899 Other long term (current) drug therapy: Secondary | ICD-10-CM | POA: Insufficient documentation

## 2017-02-13 DIAGNOSIS — E785 Hyperlipidemia, unspecified: Secondary | ICD-10-CM | POA: Insufficient documentation

## 2017-02-13 DIAGNOSIS — Z794 Long term (current) use of insulin: Secondary | ICD-10-CM | POA: Insufficient documentation

## 2017-02-13 DIAGNOSIS — IMO0002 Reserved for concepts with insufficient information to code with codable children: Secondary | ICD-10-CM

## 2017-02-13 DIAGNOSIS — E118 Type 2 diabetes mellitus with unspecified complications: Secondary | ICD-10-CM | POA: Insufficient documentation

## 2017-02-13 DIAGNOSIS — E1165 Type 2 diabetes mellitus with hyperglycemia: Secondary | ICD-10-CM

## 2017-02-13 DIAGNOSIS — I10 Essential (primary) hypertension: Secondary | ICD-10-CM

## 2017-02-13 LAB — GLUCOSE, POCT (MANUAL RESULT ENTRY): POC Glucose: 119 mg/dl — AB (ref 70–99)

## 2017-02-13 MED ORDER — HYDROCHLOROTHIAZIDE 25 MG PO TABS: 25.0000 mg | ORAL_TABLET | Freq: Every day | ORAL | 2 refills | Status: DC

## 2017-02-13 MED ORDER — HYDRALAZINE HCL 100 MG PO TABS: 100.0000 mg | ORAL_TABLET | Freq: Three times a day (TID) | ORAL | 2 refills | Status: DC

## 2017-02-13 MED ORDER — FUROSEMIDE 20 MG PO TABS: 20.0000 mg | ORAL_TABLET | Freq: Every day | ORAL | 0 refills | Status: DC

## 2017-02-13 MED ORDER — BENZONATATE 100 MG PO CAPS: 100.0000 mg | ORAL_CAPSULE | Freq: Three times a day (TID) | ORAL | 0 refills | Status: DC | PRN

## 2017-02-13 NOTE — Progress Notes (Signed)
Katherine [name], is a 61 y.o. female  ACZ:[id number]  [id number]  DOB - [birth year]  Chief Complaint  Patient presents with  . Hypertension  . Diabetes        Subjective:   Katherine [name] is a 61 y.o. female here today for a follow up visit for htn. I saw her on 02/06/17 for accel htn, and rx was adjusted at time.  Unfortunately, she thinks she is still taking hydralazine 50 tid, she did not recall hctz 25qd. She does remember taking norvasc 10 and losartan 100qd.   She c/o of feet swelling today more so than normal as well.  Patient has No headache, No chest pain, No abdominal pain - No Nausea, No new weakness tingling or numbness, No Cough - SOB.  No problems updated.  ALLERGIES: Allergies  Allergen Reactions  . Other Swelling    Seaweed= swelling on arms, hands and face  . Metformin And Related Nausea Only    PAST MEDICAL HISTORY: Past Medical History:  Diagnosis Date  . Boil of buttock   . Diabetes mellitus   . Hyperlipidemia   . Hypertension     MEDICATIONS AT HOME: Prior to Admission medications   Medication Sig Start Date End Date Taking? Authorizing Provider  amLODipine (NORVASC) 10 MG tablet Take 1 tablet (10 mg total) by mouth daily. 12/26/16   Maren Reamer, MD  aspirin (ASPIRIN CHILDRENS) 81 MG chewable tablet Chew 1 tablet (81 mg total) by mouth daily. 09/19/16   Maren Reamer, MD  benzonatate (TESSALON PERLES) 100 MG capsule Take 1 capsule (100 mg total) by mouth 3 (three) times daily as needed for cough. 02/13/17   Maren Reamer, MD  Blood Glucose Monitoring Suppl (TRUE METRIX METER) W/DEVICE KIT USE AS INSTRUCTED 10/22/15   Lance Bosch, NP  carbamide peroxide (DEBROX) 6.5 % otic solution Place 5 drops into the right ear 2 (two) times daily. Patient not taking: Reported on 02/13/2017 12/26/16   Maren Reamer, MD  chlorhexidine (PERIDEX) 0.12 % solution SWISH IN MOUTH 15 MLS FOR 30 SECONDS THEN SPIT OUT. USE 2 TIMES DAILY AFTER BREAKFAST AND AT  BEDTIME 01/12/17   Maren Reamer, MD  furosemide (LASIX) 20 MG tablet Take 1 tablet (20 mg total) by mouth daily. 02/13/17   Maren Reamer, MD  gabapentin (NEURONTIN) 300 MG capsule Take 1 capsule (300 mg total) by mouth 3 (three) times daily. 09/19/16   Maren Reamer, MD  glucose blood (TRUE METRIX BLOOD GLUCOSE TEST) test strip Use as instructed 10/22/15   Lance Bosch, NP  hydrALAZINE (APRESOLINE) 100 MG tablet Take 1 tablet (100 mg total) by mouth 3 (three) times daily. 02/13/17   Maren Reamer, MD  hydrochlorothiazide (HYDRODIURIL) 25 MG tablet Take 1 tablet (25 mg total) by mouth daily. 02/13/17   Maren Reamer, MD  insulin aspart (NOVOLOG FLEXPEN) 100 UNIT/ML FlexPen Inject 25 Units into the skin 3 (three) times daily with meals. 12/26/16   Maren Reamer, MD  Insulin Glargine (LANTUS SOLOSTAR) 100 UNIT/ML Solostar Pen Inject 75 Units into the skin 2 (two) times daily. 02/06/17   Maren Reamer, MD  Insulin Pen Needle (ULTICARE MICRO PEN NEEDLES) 32G X 4 MM MISC USE AS DIRECTED 02/09/16   Lance Bosch, NP  Insulin Syringe-Needle U-100 28G X 1/2" 0.3 ML MISC Check blood sugar TID & QHS 04/08/15   Lorayne Marek, MD  loratadine (CLARITIN) 10 MG tablet Take 1 tablet (10  mg total) by mouth daily. Patient not taking: Reported on 11/27/2016 08/23/16   Maren Reamer, MD  losartan (COZAAR) 100 MG tablet Take 1 tablet (100 mg total) by mouth daily. 12/26/16   Maren Reamer, MD  meclizine (ANTIVERT) 12.5 MG tablet Take 1 tablet (12.5 mg total) by mouth 3 (three) times daily as needed for dizziness. Patient not taking: Reported on 11/27/2016 02/23/16   Okey Regal, PA-C  metFORMIN (GLUCOPHAGE-XR) 500 MG 24 hr tablet Take 2 tablets (1,000 mg total) by mouth 2 (two) times daily with a meal. 01/11/17   Tresa Garter, MD  OVER THE COUNTER MEDICATION Take 1 tablet by mouth daily. Over the counter supplement with iron and other ingredients    Historical Provider, MD  oxyCODONE-acetaminophen  (PERCOCET) 5-325 MG tablet Take 1-2 tablets by mouth every 6 (six) hours as needed. Patient not taking: Reported on 11/27/2016 06/25/16   Veryl Speak, MD  pravastatin (PRAVACHOL) 20 MG tablet Take 1 tablet (20 mg total) by mouth daily. 12/26/16   Maren Reamer, MD  Pyridoxine HCl (VITAMIN B-6 PO) Take 1 tablet by mouth daily.    Historical Provider, MD     Objective:   Vitals:   02/13/17 1515  BP: (!) 192/94  Pulse: 83  Resp: 16  Temp: 97.6 F (36.4 C)  TempSrc: Oral  SpO2: 96%  Weight: 230 lb 9.6 oz (104.6 kg)    Exam General appearance : Awake, alert, not in any distress. Speech Clear. Not toxic looking, pleasant. HEENT: Atraumatic and Normocephalic, pupils equally reactive to light. Neck: supple, no JVD.  Chest:Good air entry bilaterally, no added sounds. CVS: S1 S2 regular, no murmurs/gallups or rubs. Abdomen: Bowel sounds active, Non tender and not distended with no gaurding, rigidity or rebound. Extremities: B/L Lower Ext shows 1+ edema, both legs are warm to touch Neurology: Awake alert, and oriented X 3, CN II-XII grossly intact, Non focal Skin:No Rash  Data Review Lab Results  Component Value Date   HGBA1C 11.7 12/26/2016   HGBA1C 12.4 07/25/2016   HGBA1C 13.1 01/12/2016    Depression screen PHQ 2/9 02/13/2017 12/26/2016 09/19/2016 07/25/2016 02/02/2016  Decreased Interest '1 2 1 2 '$ 0  Down, Depressed, Hopeless '1 1 1 2 '$ 0  PHQ - 2 Score '2 3 2 4 '$ 0  Altered sleeping '1 2 3 1 '$ -  Tired, decreased energy '2 1 3 3 '$ -  Change in appetite '1 2 1 2 '$ -  Feeling bad or failure about yourself  2 1 0 1 -  Trouble concentrating '1 1 1 1 '$ -  Moving slowly or fidgety/restless 0 0 1 0 -  Suicidal thoughts 0 0 0 0 -  PHQ-9 Score '9 10 11 12 '$ -      Assessment & Plan    1 . HYPERTENSION, malignant - I reviewed her med list w/ her on AVS and showed her the 5 meds for now she will be on. - started her on short course of lasix 20 qd x 5 day - continue cozaar 100 and norvasc 10 qd - pick  up hctz 25 qd. - if she has hydralazine 50 mg tabs at home, instructed her to take 2 tabs = '100mg'$  tid until she picks up the higher prescription. - hydrALAZINE (APRESOLINE) 100 MG tablet; Take 1 tablet (100 mg total) by mouth 3 (three) times daily.  Dispense: 90 tablet; Refill: 2 - Basic metabolic panel - Brain natriuretic peptide - fu w/ me in 2 wks, hopefully  will combine cozaar and hctz to cozaar by than.   2. Uncontrolled type 2 diabetes mellitus with complication, with long-term current use of insulin (HCC) - cbg much better today. - POCT glucose (manual entry) 119   Patient have been counseled extensively about nutrition and exercise  Return in about 2 weeks (around 02/27/2017) for htn /fluid retention.  The patient was given clear instructions to go to ER or return to medical center if symptoms don't improve, worsen or new problems develop. The patient verbalized understanding. The patient was told to call to get lab results if they haven't heard anything in the next week.   This note has been created with Surveyor, quantity. Any transcriptional errors are unintentional.   Maren Reamer, MD, [place] [facility], [place]   02/13/2017, 5:10 PM

## 2017-02-13 NOTE — Patient Instructions (Signed)

## 2017-02-14 ENCOUNTER — Other Ambulatory Visit: Payer: Self-pay | Admitting: Internal Medicine

## 2017-02-14 LAB — BASIC METABOLIC PANEL
BUN / CREAT RATIO: 18 (ref 12–28)
BUN: 13 mg/dL (ref 8–27)
CALCIUM: 9.2 mg/dL (ref 8.7–10.3)
CHLORIDE: 100 mmol/L (ref 96–106)
CO2: 30 mmol/L — ABNORMAL HIGH (ref 18–29)
Creatinine, Ser: 0.73 mg/dL (ref 0.57–1.00)
GFR calc non Af Amer: 90 mL/min/{1.73_m2} (ref >59)
GFR, EST AFRICAN AMERICAN: 104 mL/min/{1.73_m2} (ref >59)
Glucose: 74 mg/dL (ref 65–99)
POTASSIUM: 3.3 mmol/L — AB (ref 3.5–5.2)
Sodium: 144 mmol/L (ref 134–144)

## 2017-02-14 LAB — BRAIN NATRIURETIC PEPTIDE: BNP: 82.5 pg/mL (ref 0.0–100.0)

## 2017-02-14 MED ORDER — POTASSIUM CHLORIDE ER 10 MEQ PO TBCR: 10.0000 meq | EXTENDED_RELEASE_TABLET | Freq: Every day | ORAL | 0 refills | Status: DC

## 2017-02-15 ENCOUNTER — Telehealth: Payer: Self-pay

## 2017-02-15 NOTE — Telephone Encounter (Signed)
Contacted pt to go over lab results pt is aware of results and doesn't have any questions or concerns 

## 2017-03-04 ENCOUNTER — Emergency Department (HOSPITAL_COMMUNITY)
Admission: EM | Admit: 2017-03-04 | Discharge: 2017-03-05 | Disposition: A | Discharge status: Home / Self Care | Payer: Self-pay | Attending: Emergency Medicine | Admitting: Emergency Medicine

## 2017-03-04 ENCOUNTER — Encounter (HOSPITAL_COMMUNITY): Payer: Self-pay | Admitting: Emergency Medicine

## 2017-03-04 ENCOUNTER — Emergency Department (HOSPITAL_COMMUNITY): Payer: Self-pay

## 2017-03-04 DIAGNOSIS — Z7982 Long term (current) use of aspirin: Secondary | ICD-10-CM | POA: Insufficient documentation

## 2017-03-04 DIAGNOSIS — Y999 Unspecified external cause status: Secondary | ICD-10-CM | POA: Insufficient documentation

## 2017-03-04 DIAGNOSIS — Z79899 Other long term (current) drug therapy: Secondary | ICD-10-CM | POA: Insufficient documentation

## 2017-03-04 DIAGNOSIS — Y92009 Unspecified place in unspecified non-institutional (private) residence as the place of occurrence of the external cause: Secondary | ICD-10-CM | POA: Insufficient documentation

## 2017-03-04 DIAGNOSIS — E1169 Type 2 diabetes mellitus with other specified complication: Secondary | ICD-10-CM | POA: Insufficient documentation

## 2017-03-04 DIAGNOSIS — Y9389 Activity, other specified: Secondary | ICD-10-CM | POA: Insufficient documentation

## 2017-03-04 DIAGNOSIS — X371XXA Tornado, initial encounter: Secondary | ICD-10-CM | POA: Insufficient documentation

## 2017-03-04 DIAGNOSIS — I10 Essential (primary) hypertension: Secondary | ICD-10-CM | POA: Insufficient documentation

## 2017-03-04 DIAGNOSIS — Z794 Long term (current) use of insulin: Secondary | ICD-10-CM | POA: Insufficient documentation

## 2017-03-04 DIAGNOSIS — M545 Low back pain: Secondary | ICD-10-CM | POA: Insufficient documentation

## 2017-03-04 LAB — CBG MONITORING, ED: Glucose-Capillary: 304 mg/dL — ABNORMAL HIGH (ref 65–99)

## 2017-03-04 MED ORDER — LORAZEPAM 1 MG PO TABS
0.5000 mg | ORAL_TABLET | Freq: Once | ORAL | Status: AC
  Administered 2017-03-04: 0.5 mg via ORAL
  Filled 2017-03-04: qty 1

## 2017-03-04 MED ORDER — OXYCODONE-ACETAMINOPHEN 5-325 MG PO TABS
1.0000 | ORAL_TABLET | Freq: Once | ORAL | Status: AC
  Administered 2017-03-04: 1 via ORAL
  Filled 2017-03-04: qty 1
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
## 2017-03-05 MED ORDER — OXYCODONE-ACETAMINOPHEN 5-325 MG PO TABS: 1.0000 | ORAL_TABLET | Freq: Four times a day (QID) | ORAL | 0 refills | Status: DC | PRN

## 2017-03-05 MED ORDER — HYDRALAZINE HCL 50 MG PO TABS
100.0000 mg | ORAL_TABLET | Freq: Three times a day (TID) | ORAL | Status: DC
  Administered 2017-03-05: 100 mg via ORAL
  Filled 2017-03-05: qty 2

## 2017-03-05 MED ORDER — GABAPENTIN 300 MG PO CAPS: 300.0000 mg | ORAL_CAPSULE | Freq: Three times a day (TID) | ORAL | Status: DC

## 2017-03-05 MED ORDER — INSULIN GLARGINE 100 UNIT/ML ~~LOC~~ SOLN
75.0000 [IU] | Freq: Two times a day (BID) | SUBCUTANEOUS | Status: DC
Start: 2017-03-05 — End: 2017-03-05
  Administered 2017-03-05: 75 [IU] via SUBCUTANEOUS
  Filled 2017-03-05 (×2): qty 0.75
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
## 2017-03-05 NOTE — ED Notes (Signed)
E-signature not working. Pt states she understands d/c instructions.  

## 2017-03-06 ENCOUNTER — Ambulatory Visit: Payer: Self-pay | Admitting: Internal Medicine

## 2017-04-04 ENCOUNTER — Ambulatory Visit: Payer: No Typology Code available for payment source | Admitting: Podiatry

## 2017-04-05 ENCOUNTER — Encounter: Payer: Self-pay | Admitting: Internal Medicine

## 2017-04-06 ENCOUNTER — Encounter: Payer: Self-pay | Admitting: Internal Medicine

## 2017-04-09 ENCOUNTER — Encounter: Payer: Self-pay | Admitting: Internal Medicine

## 2017-05-03 MED FILL — AMLODIPINE BESYLATE 10 MG T: 10 | 30 days supply | Qty: 30 | Fill #1

## 2017-05-03 MED FILL — LOSARTAN POTASSIUM 100 MG T: 100 | 30 days supply | Qty: 30 | Fill #1

## 2017-05-11 ENCOUNTER — Telehealth: Payer: Self-pay | Admitting: Internal Medicine
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
## 2017-05-14 MED ORDER — TRUE METRIX METER W/DEVICE KIT: PACK | 0 refills | Status: DC

## 2017-05-14 MED ORDER — GLUCOSE BLOOD VI STRP: ORAL_STRIP | 12 refills | Status: DC

## 2017-05-14 MED ORDER — TRUEPLUS LANCETS 28G MISC: 12 refills | Status: DC

## 2017-05-14 NOTE — Telephone Encounter (Signed)
Blood glucose meter and supplies sent to [facility] pharmacy. 

## 2017-06-07 ENCOUNTER — Ambulatory Visit: Payer: Self-pay | Attending: Internal Medicine | Admitting: Internal Medicine

## 2017-06-07 ENCOUNTER — Encounter: Payer: Self-pay | Admitting: Internal Medicine

## 2017-06-07 VITALS — BP 148/70 | HR 87 | Temp 98.6°F | Resp 16 | Wt 220.8 lb

## 2017-06-07 DIAGNOSIS — Z833 Family history of diabetes mellitus: Secondary | ICD-10-CM | POA: Insufficient documentation

## 2017-06-07 DIAGNOSIS — E1143 Type 2 diabetes mellitus with diabetic autonomic (poly)neuropathy: Secondary | ICD-10-CM | POA: Insufficient documentation

## 2017-06-07 DIAGNOSIS — K219 Gastro-esophageal reflux disease without esophagitis: Secondary | ICD-10-CM | POA: Insufficient documentation

## 2017-06-07 DIAGNOSIS — Z79899 Other long term (current) drug therapy: Secondary | ICD-10-CM | POA: Insufficient documentation

## 2017-06-07 DIAGNOSIS — Z87440 Personal history of urinary (tract) infections: Secondary | ICD-10-CM | POA: Insufficient documentation

## 2017-06-07 DIAGNOSIS — I10 Essential (primary) hypertension: Secondary | ICD-10-CM | POA: Insufficient documentation

## 2017-06-07 DIAGNOSIS — Z888 Allergy status to other drugs, medicaments and biological substances status: Secondary | ICD-10-CM | POA: Insufficient documentation

## 2017-06-07 DIAGNOSIS — L84 Corns and callosities: Secondary | ICD-10-CM | POA: Insufficient documentation

## 2017-06-07 DIAGNOSIS — E785 Hyperlipidemia, unspecified: Secondary | ICD-10-CM | POA: Insufficient documentation

## 2017-06-07 DIAGNOSIS — E118 Type 2 diabetes mellitus with unspecified complications: Secondary | ICD-10-CM | POA: Insufficient documentation

## 2017-06-07 DIAGNOSIS — IMO0002 Reserved for concepts with insufficient information to code with codable children: Secondary | ICD-10-CM

## 2017-06-07 DIAGNOSIS — Z794 Long term (current) use of insulin: Secondary | ICD-10-CM | POA: Insufficient documentation

## 2017-06-07 DIAGNOSIS — E1142 Type 2 diabetes mellitus with diabetic polyneuropathy: Secondary | ICD-10-CM | POA: Insufficient documentation

## 2017-06-07 DIAGNOSIS — E1165 Type 2 diabetes mellitus with hyperglycemia: Secondary | ICD-10-CM

## 2017-06-07 DIAGNOSIS — K3184 Gastroparesis: Secondary | ICD-10-CM | POA: Insufficient documentation

## 2017-06-07 DIAGNOSIS — Z7982 Long term (current) use of aspirin: Secondary | ICD-10-CM | POA: Insufficient documentation

## 2017-06-07 DIAGNOSIS — Z8249 Family history of ischemic heart disease and other diseases of the circulatory system: Secondary | ICD-10-CM | POA: Insufficient documentation

## 2017-06-07 DIAGNOSIS — Z841 Family history of disorders of kidney and ureter: Secondary | ICD-10-CM | POA: Insufficient documentation

## 2017-06-07 LAB — GLUCOSE, POCT (MANUAL RESULT ENTRY): POC Glucose: 267 mg/dl — AB (ref 70–99)

## 2017-06-07 LAB — POCT GLYCOSYLATED HEMOGLOBIN (HGB A1C): Hemoglobin A1C: 12.2

## 2017-06-07 MED ORDER — POTASSIUM CHLORIDE ER 10 MEQ PO TBCR: 10.0000 meq | EXTENDED_RELEASE_TABLET | Freq: Every day | ORAL | 6 refills | Status: DC

## 2017-06-07 MED ORDER — GABAPENTIN 300 MG PO CAPS: 600.0000 mg | ORAL_CAPSULE | Freq: Three times a day (TID) | ORAL | 5 refills | Status: DC

## 2017-06-07 MED ORDER — INSULIN GLARGINE 100 UNIT/ML SOLOSTAR PEN: 85.0000 [IU] | PEN_INJECTOR | Freq: Two times a day (BID) | SUBCUTANEOUS | 2 refills | Status: DC

## 2017-06-07 MED ORDER — TRUE METRIX METER W/DEVICE KIT: PACK | 0 refills | Status: DC

## 2017-06-07 MED ORDER — HYDROCHLOROTHIAZIDE 25 MG PO TABS: 25.0000 mg | ORAL_TABLET | Freq: Every day | ORAL | 6 refills | Status: DC

## 2017-06-07 MED ORDER — TRUEPLUS LANCETS 28G MISC: 6 refills | Status: DC

## 2017-06-07 MED ORDER — LOSARTAN POTASSIUM 50 MG PO TABS: 50.0000 mg | ORAL_TABLET | Freq: Every day | ORAL | 6 refills | Status: DC

## 2017-06-07 MED ORDER — INSULIN ASPART 100 UNIT/ML FLEXPEN: 28.0000 [IU] | PEN_INJECTOR | Freq: Three times a day (TID) | SUBCUTANEOUS | 3 refills | Status: DC

## 2017-06-07 MED ORDER — GLUCOSE BLOOD VI STRP: ORAL_STRIP | 12 refills | Status: DC

## 2017-06-07 MED FILL — HYDROCHLOROTHIAZIDE 25 MG T: 25 | 30 days supply | Qty: 30 | Fill #0

## 2017-06-07 MED FILL — TRUE METRIX TEST STRIP: 30 days supply | Qty: 100 | Fill #0

## 2017-06-07 MED FILL — !LANTUS SOLOSTAR 100UNITS/M: 100 | 8 days supply | Qty: 15 | Fill #0

## 2017-06-07 MED FILL — $novoLOG FLEXPEN SYRINGE: 100 | 17 days supply | Qty: 15 | Fill #0

## 2017-06-07 MED FILL — TRUEplus LANCETS 28G MISC: 30 days supply | Qty: 100 | Fill #0

## 2017-06-07 MED FILL — LOSARTAN POTASSIUM 50 MG TA: 50 | 30 days supply | Qty: 30 | Fill #0

## 2017-06-07 MED FILL — GABAPENTIN 300 MG CAPSULE: 300 | 30 days supply | Qty: 180 | Fill #0

## 2017-06-07 MED FILL — TRUE METRIX BLOOD GLUCOSE M: W/DEVICE | 365 days supply | Qty: 1 | Fill #0

## 2017-06-07 MED FILL — POTASSIUM CL 10 MEQ TAB SA: 10 | 30 days supply | Qty: 30 | Fill #0

## 2017-06-07 MED FILL — ?AMLODIPINE BESYLATE 10 MG: 10 | 30 days supply | Qty: 30 | Fill #2

## 2017-06-07 NOTE — Patient Instructions (Signed)
Please give pt form for [facility] card and appt with Ms. Luciana Axe. Give appointment with Marzetta Board in 2 wks.  Follow a Healthy Eating Plan - You can do it! Limit sugary drinks.  Avoid sodas, sweet tea, sport or energy drinks, or fruit drinks.  Drink water, lo-fat milk, or diet drinks. Limit snack foods.   Cut back on candy, cake, cookies, chips, ice cream.  These are a special treat, only in small amounts. Eat plenty of vegetables.  Especially dark green, red, and orange vegetables. Aim for at least 3 servings a day. More is better! Include fruit in your daily diet.  Whole fruit is much healthier than fruit juice! Limit "white" bread, "white" pasta, "white" rice.   Choose "100% whole grain" products, brown or wild rice. Avoid fatty meats. Try "Meatless Monday" and choose eggs or beans one day a week.  When eating meat, choose lean meats like chicken, [place], and fish.  Grill, broil, or bake meats instead of frying, and eat poultry without the skin. Eat less salt.  Avoid frozen pizzas, frozen dinners and salty foods.  Use seasonings other than salt in cooking.  This can help blood pressure and keep you from swelling Beer, wine and liquor have calories.  If you can safely drink alcohol, limit to 1 drink per day for women, 2 drinks for men
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
## 2017-06-15 ENCOUNTER — Ambulatory Visit: Payer: Self-pay | Attending: Internal Medicine

## 2017-06-26 ENCOUNTER — Other Ambulatory Visit: Payer: Self-pay

## 2017-06-26 ENCOUNTER — Encounter: Payer: Self-pay | Admitting: Pharmacist

## 2017-06-26 DIAGNOSIS — E1165 Type 2 diabetes mellitus with hyperglycemia: Secondary | ICD-10-CM

## 2017-06-26 DIAGNOSIS — E118 Type 2 diabetes mellitus with unspecified complications: Principal | ICD-10-CM

## 2017-06-26 DIAGNOSIS — Z794 Long term (current) use of insulin: Principal | ICD-10-CM

## 2017-06-26 DIAGNOSIS — IMO0002 Reserved for concepts with insufficient information to code with codable children: Secondary | ICD-10-CM

## 2017-06-26 MED ORDER — INSULIN GLARGINE 100 UNIT/ML SOLOSTAR PEN: 85.0000 [IU] | PEN_INJECTOR | Freq: Two times a day (BID) | SUBCUTANEOUS | 3 refills | Status: DC

## 2017-06-26 NOTE — Progress Notes (Deleted)
    S:     No chief complaint on file.   Patient arrives ***.  Presents for diabetes evaluation, education, and management at the request of Dr. Wynetta Emery. Patient was referred on 06/07/17.  Patient was last seen by Primary Care Provider on 06/07/17.   Patient {Actions; denies-reports:120008} adherence with medications.  Current diabetes medications include: Lantus 85 units BID, NovoLog 28 units 3 times a day with meals. Metformin was d/ced at last visit due to nausea. Current hypertension medications include: losartan 50 mg daily, HCTZ 25 mg daily, hydralazine 100 mg TID, and amlodipine 10 mg daily.   Patient {Actions; denies-reports:120008} hypoglycemic events.  Patient reported dietary habits: Eats *** meals/day Breakfast:*** Lunch:*** Dinner:*** Snacks:*** Drinks:***  Patient reported exercise habits:    Patient {Actions; denies-reports:120008} nocturia.  Patient {Actions; denies-reports:120008} neuropathy. Patient {Actions; denies-reports:120008} visual changes. Patient {Actions; denies-reports:120008} self foot exams.    O:  Physical Exam   ROS   Lab Results  Component Value Date   HGBA1C 12.2 06/07/2017   There were no vitals filed for this visit.  Home fasting CBG: ***  2 hour post-prandial/random CBG: ***.  10 year ASCVD risk: ***.  A/P: Diabetes longstanding/newly diagnosed currently ***. Patient {Actions; denies-reports:120008} hypoglycemic events and is able to verbalize appropriate hypoglycemia management plan. Patient {Actions; denies-reports:120008} adherence with medication. Control is suboptimal due to sedentary lifestyle and dietary indiscretion.Marland Kitchen  Next A1C anticipated October 2018.    ASCVD risk greater than 7.5%. {Meds adjust:18428} Aspirin *** mg and {Meds adjust:18428} ***statin *** mg.   Hypertension longstanding/newly diagnosed currently ***.  Patient {Actions; denies-reports:120008} adherence with medication. Control is suboptimal due to  ***.  Written patient instructions provided.  Total time in face to face counseling *** minutes.   Follow up in Pharmacist Clinic Visit ***.   Patient seen with ***

## 2017-07-02 ENCOUNTER — Encounter: Payer: Self-pay | Admitting: Podiatry

## 2017-07-02 ENCOUNTER — Ambulatory Visit: Payer: Self-pay | Admitting: Podiatry

## 2017-07-02 ENCOUNTER — Ambulatory Visit: Payer: Self-pay

## 2017-07-02 DIAGNOSIS — B351 Tinea unguium: Secondary | ICD-10-CM

## 2017-07-02 DIAGNOSIS — I70245 Atherosclerosis of native arteries of left leg with ulceration of other part of foot: Secondary | ICD-10-CM

## 2017-07-02 DIAGNOSIS — L97522 Non-pressure chronic ulcer of other part of left foot with fat layer exposed: Secondary | ICD-10-CM

## 2017-07-02 DIAGNOSIS — M79676 Pain in unspecified toe(s): Secondary | ICD-10-CM

## 2017-07-02 DIAGNOSIS — E0842 Diabetes mellitus due to underlying condition with diabetic polyneuropathy: Secondary | ICD-10-CM

## 2017-07-02 DIAGNOSIS — M79675 Pain in left toe(s): Secondary | ICD-10-CM

## 2017-07-02 MED ORDER — SULFAMETHOXAZOLE-TRIMETHOPRIM 800-160 MG PO TABS: 1.0000 | ORAL_TABLET | Freq: Two times a day (BID) | ORAL | 0 refills | Status: DC

## 2017-07-02 MED FILL — hydrALAZINE HCL 100 MG TABS: 100 | 30 days supply | Qty: 90 | Fill #1

## 2017-07-02 MED FILL — SULFAMETHOXAZOLE-TMP DS TAB: 800-160 | 10 days supply | Qty: 20 | Fill #0

## 2017-07-02 NOTE — Patient Instructions (Signed)
Betadine/iodine daily to wound. Apply a large Band-Aid daily.

## 2017-07-02 NOTE — Progress Notes (Signed)
   Subjective:  Patient with a history of diabetes mellitus presents today for evaluation ulceration(s) to the lower extremitie(s). Patient states that he did have a history of diabetes mellitus which is not well controlled. Patient states that the ulcer is been present for approximately 3 weeks. She has noticed a lot of swelling in the feet and ankles. Patient also complains of elongated, thickened, discolored, painful toenails to the bilateral feet 1-5. Patient presents today for further treatment and evaluation   Objective/Physical Exam General: The patient is alert and oriented x3 in no acute distress.  Dermatology:  Wound #1 noted to the plantar aspect left great toe measuring approximately [phone] cm (LxWxD).   To the noted ulceration(s), there is no eschar. There is a moderate amount of slough, fibrin, and necrotic tissue noted. Granulation tissue and wound base is red. There is a minimal amount of serosanguineous drainage noted. There is no exposed bone muscle-tendon ligament or joint. There is no malodor. Periwound integrity is intact. Skin is warm, dry and supple bilateral lower extremities.  Elongated, thickened, discolored toenails noted 1-5 bilateral.  Vascular: Palpable pedal pulses bilaterally. No edema or erythema noted. Capillary refill within normal limits.  Neurological: Epicritic and protective threshold absent bilaterally.   Musculoskeletal Exam: Range of motion within normal limits to all pedal and ankle joints bilateral. Muscle strength 5/5 in all groups bilateral.   Assessment: #1 ulcer left great toe secondary to diabetes mellitus #2 diabetes mellitus w/ peripheral neuropathy #3 symptomatic onychomycosis bilateral feet   Plan of Care:  #1 Patient was evaluated. #2 medically necessary excisional debridement including subcutaneous tissue was performed using a tissue nipper and a chisel blade. Excisional debridement of all the necrotic nonviable tissue down to  healthy bleeding viable tissue was performed with post-debridement measurements same as pre-. #3 the wound was cleansed and dry sterile dressing applied. #4 mechanical debridement of nails 1-5 bilaterally was performed using a nail nipper without incident or bleeding.  #5 today cultures were taken of the ulcer to the left great toe consent to pathology for culture and sensitivity  #6 prescription for Bactrim DS #20  #7 recommended the patient applied Betadine with a Band-Aid daily  #8 patient is to return to clinic in 3 weeks.   Edrick Kins, DPM [facility]  Dr. Edrick Kins, [place]                                        [place], [place] [address]                Office [phone]  Fax [phone]

## 2017-07-04 ENCOUNTER — Other Ambulatory Visit: Payer: Self-pay

## 2017-07-04 MED ORDER — INSULIN PEN NEEDLE 32G X 4 MM MISC: 11 refills | Status: DC

## 2017-07-05 LAB — WOUND CULTURE
GRAM STAIN: NONE SEEN
Gram Stain: NONE SEEN

## 2017-07-12 ENCOUNTER — Ambulatory Visit: Payer: Self-pay | Attending: Internal Medicine | Admitting: Internal Medicine

## 2017-07-12 ENCOUNTER — Encounter: Payer: Self-pay | Admitting: Internal Medicine

## 2017-07-12 VITALS — BP 180/68 | HR 82 | Temp 98.4°F | Resp 16 | Wt 229.0 lb

## 2017-07-12 DIAGNOSIS — E1165 Type 2 diabetes mellitus with hyperglycemia: Secondary | ICD-10-CM

## 2017-07-12 DIAGNOSIS — K3184 Gastroparesis: Secondary | ICD-10-CM | POA: Insufficient documentation

## 2017-07-12 DIAGNOSIS — E118 Type 2 diabetes mellitus with unspecified complications: Secondary | ICD-10-CM

## 2017-07-12 DIAGNOSIS — E049 Nontoxic goiter, unspecified: Secondary | ICD-10-CM

## 2017-07-12 DIAGNOSIS — Z794 Long term (current) use of insulin: Secondary | ICD-10-CM

## 2017-07-12 DIAGNOSIS — IMO0002 Reserved for concepts with insufficient information to code with codable children: Secondary | ICD-10-CM

## 2017-07-12 DIAGNOSIS — I10 Essential (primary) hypertension: Secondary | ICD-10-CM

## 2017-07-12 DIAGNOSIS — K219 Gastro-esophageal reflux disease without esophagitis: Secondary | ICD-10-CM | POA: Insufficient documentation

## 2017-07-12 DIAGNOSIS — R110 Nausea: Secondary | ICD-10-CM

## 2017-07-12 DIAGNOSIS — Z7982 Long term (current) use of aspirin: Secondary | ICD-10-CM | POA: Insufficient documentation

## 2017-07-12 DIAGNOSIS — E785 Hyperlipidemia, unspecified: Secondary | ICD-10-CM | POA: Insufficient documentation

## 2017-07-12 DIAGNOSIS — F329 Major depressive disorder, single episode, unspecified: Secondary | ICD-10-CM | POA: Insufficient documentation

## 2017-07-12 DIAGNOSIS — E1143 Type 2 diabetes mellitus with diabetic autonomic (poly)neuropathy: Secondary | ICD-10-CM | POA: Insufficient documentation

## 2017-07-12 DIAGNOSIS — R499 Unspecified voice and resonance disorder: Secondary | ICD-10-CM

## 2017-07-12 LAB — GLUCOSE, POCT (MANUAL RESULT ENTRY): POC Glucose: 222 mg/dl — AB (ref 70–99)

## 2017-07-12 MED ORDER — HYDROCHLOROTHIAZIDE 25 MG PO TABS
25.0000 mg | ORAL_TABLET | Freq: Every day | ORAL | 6 refills | Status: DC
Start: 2017-07-12 — End: 2018-07-11

## 2017-07-12 MED ORDER — INSULIN PEN NEEDLE 32G X 4 MM MISC: 11 refills | Status: DC

## 2017-07-12 MED ORDER — ONDANSETRON HCL 4 MG PO TABS: 4.0000 mg | ORAL_TABLET | Freq: Three times a day (TID) | ORAL | 0 refills | Status: DC | PRN

## 2017-07-12 MED FILL — HYDROCHLOROTHIAZIDE 25 MG T: 25 | 30 days supply | Qty: 30 | Fill #0

## 2017-07-12 MED FILL — ULTICARE PEN NDL 4MM 32G: 32G X 4 MM | 30 days supply | Qty: 100 | Fill #0

## 2017-07-12 MED FILL — ?ONDANSETRON HCL 4 MG TABLE: 4 | 6 days supply | Qty: 20 | Fill #0
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
## 2017-07-12 NOTE — Patient Instructions (Signed)
Give appt with Stacy in 2 wk for recheck BP and diabetes.   Increase Lantus to 95 units twice a day. Take Novolog 27 units with meals. Check blood sugars 2-3 times daily before meals and bring in on visit with clinical pharmacist in 2 weeks

## 2017-07-13 ENCOUNTER — Telehealth: Payer: Self-pay

## 2017-07-13 LAB — TSH: TSH: 2.94 u[IU]/mL (ref 0.450–4.500)

## 2017-07-13 NOTE — Telephone Encounter (Signed)
Contacted pt to go over lab results pt didn't answer lvm asking pt to give me a call to go over results.   If pt calls back please give results: thyroid level is normal. However we will still get a thyroid ultrasound as the thyroid gland on exam is enlarged.Marland Kitchen   Ultrasound appointment information is in last phone note

## 2017-07-13 NOTE — Telephone Encounter (Signed)
Contacted pt to go over [place] appointment pt didn't answer lvm regarding information.  Ultrasound is schedule for July 19, 2017 @12 :30pm at [facility]. Pt is to arrive at 1215pm.

## 2017-07-16 DIAGNOSIS — R600 Localized edema: Secondary | ICD-10-CM | POA: Insufficient documentation

## 2017-07-16 DIAGNOSIS — R0789 Other chest pain: Secondary | ICD-10-CM | POA: Insufficient documentation

## 2017-07-16 DIAGNOSIS — Z7982 Long term (current) use of aspirin: Secondary | ICD-10-CM | POA: Insufficient documentation

## 2017-07-16 DIAGNOSIS — E1140 Type 2 diabetes mellitus with diabetic neuropathy, unspecified: Secondary | ICD-10-CM | POA: Insufficient documentation

## 2017-07-16 DIAGNOSIS — I10 Essential (primary) hypertension: Secondary | ICD-10-CM | POA: Insufficient documentation

## 2017-07-16 DIAGNOSIS — Z79899 Other long term (current) drug therapy: Secondary | ICD-10-CM | POA: Insufficient documentation

## 2017-07-16 DIAGNOSIS — Z794 Long term (current) use of insulin: Secondary | ICD-10-CM | POA: Insufficient documentation

## 2017-07-16 DIAGNOSIS — M791 Myalgia: Secondary | ICD-10-CM | POA: Insufficient documentation

## 2017-07-16 NOTE — ED Triage Notes (Signed)
Patient states she has been having intermittent chest pain since Sunday-states across chest wall/substernal. Patient states she has been having lower extremity swelling over 2 weeks ago and he gave her a diuretic pill that she has been taking. Patient states the swelling has been controlled but has returned tonight. Patient states SOB during the day Monday. States nauseated this past Saturday.

## 2017-07-17 ENCOUNTER — Emergency Department (HOSPITAL_COMMUNITY)
Admission: EM | Admit: 2017-07-17 | Discharge: 2017-07-17 | Disposition: A | Discharge status: Home / Self Care | Payer: Self-pay | Attending: Emergency Medicine | Admitting: Emergency Medicine

## 2017-07-17 ENCOUNTER — Other Ambulatory Visit: Payer: Self-pay

## 2017-07-17 ENCOUNTER — Encounter (HOSPITAL_COMMUNITY): Payer: Self-pay

## 2017-07-17 ENCOUNTER — Emergency Department (HOSPITAL_COMMUNITY): Payer: Self-pay

## 2017-07-17 DIAGNOSIS — M791 Myalgia, unspecified site: Secondary | ICD-10-CM

## 2017-07-17 DIAGNOSIS — G629 Polyneuropathy, unspecified: Secondary | ICD-10-CM

## 2017-07-17 DIAGNOSIS — R0789 Other chest pain: Secondary | ICD-10-CM

## 2017-07-17 LAB — BASIC METABOLIC PANEL
Anion gap: 8 (ref 5–15)
BUN: 12 mg/dL (ref 6–20)
CO2: 28 mmol/L (ref 22–32)
Calcium: 8.9 mg/dL (ref 8.9–10.3)
Chloride: 104 mmol/L (ref 101–111)
Creatinine, Ser: 0.91 mg/dL (ref 0.44–1.00)
GFR calc Af Amer: >60 mL/min (ref >60)
GLUCOSE: 227 mg/dL — AB (ref 65–99)
POTASSIUM: 3.5 mmol/L (ref 3.5–5.1)
Sodium: 140 mmol/L (ref 135–145)

## 2017-07-17 LAB — CBC
HEMATOCRIT: 37.7 % (ref 36.0–46.0)
Hemoglobin: 12.3 g/dL (ref 12.0–15.0)
MCH: 27.9 pg (ref 26.0–34.0)
MCHC: 32.6 g/dL (ref 30.0–36.0)
MCV: 85.5 fL (ref 78.0–100.0)
Platelets: 265 10*3/uL (ref 150–400)
RBC: 4.41 MIL/uL (ref 3.87–5.11)
RDW: 13.4 % (ref 11.5–15.5)
WBC: 7.4 10*3/uL (ref 4.0–10.5)

## 2017-07-17 LAB — HEPATIC FUNCTION PANEL
ALBUMIN: 3.5 g/dL (ref 3.5–5.0)
ALT: 29 U/L (ref 14–54)
AST: 33 U/L (ref 15–41)
Alkaline Phosphatase: 111 U/L (ref 38–126)
BILIRUBIN DIRECT: 0.1 mg/dL (ref 0.1–0.5)
BILIRUBIN INDIRECT: 0.3 mg/dL (ref 0.3–0.9)
BILIRUBIN TOTAL: 0.4 mg/dL (ref 0.3–1.2)
Total Protein: 7.3 g/dL (ref 6.5–8.1)

## 2017-07-17 LAB — URINALYSIS, ROUTINE W REFLEX MICROSCOPIC
BILIRUBIN URINE: NEGATIVE
Glucose, UA: 250 mg/dL — AB
Ketones, ur: NEGATIVE mg/dL
Leukocytes, UA: NEGATIVE
NITRITE: NEGATIVE
PH: 6.5 (ref 5.0–8.0)
Protein, ur: NEGATIVE mg/dL
SPECIFIC GRAVITY, URINE: 1.005 (ref 1.005–1.030)
Squamous Epithelial / LPF: NONE SEEN

## 2017-07-17 LAB — LIPASE, BLOOD: LIPASE: 20 U/L (ref 11–51)

## 2017-07-17 LAB — POCT I-STAT TROPONIN I
Troponin i, poc: 0.01 ng/mL (ref 0.00–0.08)
Troponin i, poc: 0.02 ng/mL (ref 0.00–0.08)

## 2017-07-17 MED ORDER — ACETAMINOPHEN 500 MG PO TABS
1000.0000 mg | ORAL_TABLET | Freq: Once | ORAL | Status: AC
  Administered 2017-07-17: 1000 mg via ORAL
  Filled 2017-07-17: qty 2

## 2017-07-17 MED ORDER — FUROSEMIDE 10 MG/ML IJ SOLN
40.0000 mg | Freq: Once | INTRAMUSCULAR | Status: AC
  Administered 2017-07-17: 40 mg via INTRAVENOUS
  Filled 2017-07-17: qty 4

## 2017-07-17 NOTE — Discharge Instructions (Signed)
You may alternate Tylenol 1000 mg every 6 hours as needed for pain and Ibuprofen 800 mg every 8 hours as needed for pain.  Please take Ibuprofen with food. ° °

## 2017-07-17 NOTE — ED Notes (Signed)
Patient transported to X-ray 
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
## 2017-07-19 ENCOUNTER — Ambulatory Visit (HOSPITAL_COMMUNITY): Payer: Self-pay

## 2017-07-25 ENCOUNTER — Ambulatory Visit: Payer: Self-pay | Admitting: Podiatry

## 2017-07-25 ENCOUNTER — Encounter: Payer: Self-pay | Admitting: Podiatry

## 2017-07-25 VITALS — BP 185/94 | HR 73 | Temp 98.8°F | Resp 18

## 2017-07-25 DIAGNOSIS — E0842 Diabetes mellitus due to underlying condition with diabetic polyneuropathy: Secondary | ICD-10-CM

## 2017-07-25 DIAGNOSIS — L97522 Non-pressure chronic ulcer of other part of left foot with fat layer exposed: Secondary | ICD-10-CM

## 2017-07-25 DIAGNOSIS — I739 Peripheral vascular disease, unspecified: Secondary | ICD-10-CM

## 2017-07-25 DIAGNOSIS — I70245 Atherosclerosis of native arteries of left leg with ulceration of other part of foot: Secondary | ICD-10-CM

## 2017-07-25 MED ORDER — AMOXICILLIN-POT CLAVULANATE 875-125 MG PO TABS: 1.0000 | ORAL_TABLET | Freq: Two times a day (BID) | ORAL | 0 refills | Status: DC

## 2017-07-25 MED FILL — AMOX-CLAV 875-125 MG TABLET: 875-125 | 10 days supply | Qty: 20 | Fill #0

## 2017-07-29 NOTE — Progress Notes (Signed)
   Subjective:  Patient with a history of diabetes mellitus presents today for follow up evaluation of an ulceration to the left great toe. She states the wound has worsened and is now experiencing drainage from the area. She seems more concerned about the ulceration at this time. Patient presents today for further treatment and evaluation  . Objective/Physical Exam General: The patient is alert and oriented x3 in no acute distress.  Dermatology:  Wound #1 noted to the plantar aspect left great toe measuring approximately 3.5 x 2.5 x 0.1 cm (LxWxD).   To the noted ulceration(s), there is superficial eschar to the central portion of the ulcer. There is a moderate amount of slough, fibrin, and necrotic tissue noted. Granulation tissue and wound base is red. There is a minimal amount of serosanguineous drainage noted. There is no exposed bone muscle-tendon ligament or joint. There is no malodor. Periwound integrity is intact. Skin is warm, dry and supple bilateral lower extremities.  Vascular: Palpable pedal pulses bilaterally. No edema or erythema noted. Capillary refill within normal limits.  Neurological: Epicritic and protective threshold absent bilaterally.   Musculoskeletal Exam: Range of motion within normal limits to all pedal and ankle joints bilateral. Muscle strength 5/5 in all groups bilateral.   Assessment: #1 ulcer left great toe secondary to diabetes mellitus   Plan of Care:  #1 Patient was evaluated. #2 medically necessary excisional debridement including subcutaneous tissue was performed using a tissue nipper and a chisel blade. Excisional debridement of all the necrotic nonviable tissue down to healthy bleeding viable tissue was performed with post-debridement measurements same as pre-. #3 the wound was cleansed and dry sterile dressing applied. #4 Recommended Betadine daily with Betadine foot soaks.  #5 Prescription for Augmentin given to patient as per cultures taken last  visit. #6 Post op shoe dispensed.  #7 ABI performed today and are within normal limits. #8 Return to clinic in 2 weeks.   Edrick Kins, DPM [facility]  Dr. Edrick Kins, [place]                                        [place], [place] [address]                Office [phone]  Fax [phone]

## 2017-08-06 ENCOUNTER — Encounter: Payer: Self-pay | Admitting: *Deleted

## 2017-08-08 ENCOUNTER — Ambulatory Visit: Payer: Self-pay

## 2017-08-08 ENCOUNTER — Ambulatory Visit: Payer: Self-pay | Admitting: Podiatry

## 2017-08-08 ENCOUNTER — Encounter: Payer: Self-pay | Admitting: Podiatry

## 2017-08-08 DIAGNOSIS — L97522 Non-pressure chronic ulcer of other part of left foot with fat layer exposed: Secondary | ICD-10-CM

## 2017-08-08 DIAGNOSIS — E1351 Other specified diabetes mellitus with diabetic peripheral angiopathy without gangrene: Secondary | ICD-10-CM

## 2017-08-08 MED ORDER — AMOXICILLIN-POT CLAVULANATE 875-125 MG PO TABS: 1.0000 | ORAL_TABLET | Freq: Two times a day (BID) | ORAL | 0 refills | Status: DC

## 2017-08-08 MED ORDER — HYDROCODONE-ACETAMINOPHEN 5-325 MG PO TABS: 1.0000 | ORAL_TABLET | Freq: Four times a day (QID) | ORAL | 0 refills | Status: DC | PRN

## 2017-08-08 MED FILL — AMOX-CLAV 875-125 MG TABLET: 875-125 | 10 days supply | Qty: 20 | Fill #0

## 2017-08-08 NOTE — Progress Notes (Signed)
   Subjective:  61 year old female with a history of diabetes mellitus presents today for follow-up evaluation regarding an ulceration to the left great toe. Patient is currently taking Augmentin oral antibiotics as per cultures taken last visit. Patient has been applying Betadine daily with Betadine foot soaks. She is concerned that the toe looks worse and that is more dark and more painful today. She presents today for further treatment and evaluation  . Objective/Physical Exam General: The patient is alert and oriented x3 in no acute distress.  Dermatology:  Wound #1 noted to the plantar aspect left great toe measuring approximately 3.5 x 2.5 x 0.1 cm (LxWxD).  To the noted ulceration there is no drainage. There appears to be dry stable eschar noted to the ulceration site today. There is some dark discoloration also noted to the first ray of the left foot consistent with possible acute changes of peripheral arterial disease and vasculature. No malodor. Periwound integrity is intact. No other open lesions noted.  Vascular: Nonpalpable pedal pulses bilaterally. There is some moderate edema noted to the left foot. There is also delayed capillary refill.  Neurological: Epicritic and protective threshold absent bilaterally.   Musculoskeletal Exam: Range of motion within normal limits to all pedal and ankle joints bilateral. Muscle strength 5/5 in all groups bilateral.   Radiographic exam: No sign of obvious osteolytic process. Cortices appear intact. Diffuse radiolucency noted throughout the osseous structures consistent with osteoarthritis  Assessment: #1 ulcer left great toe secondary to diabetes mellitus #2 peripheral arterial disease left lower extremity   Plan of Care:  #1 Patient was evaluated. X-rays reviewed today #2 ABIs taken last visit in office were within normal limits, however today there appears to be acute arterial changes. #3 referral placed for vascular consultation and  workup #4 continue Betadine soaks daily with application of Betadine to the left great toe #5 recommend that the patient finished her oral antibiotic  #6 return to clinic in 4 weeks  Edrick Kins, DPM [facility]  Dr. Edrick Kins, [address] [place]                                        [place], [place] [address]                Office [phone]  Fax [phone]

## 2017-08-09 ENCOUNTER — Telehealth: Payer: Self-pay | Admitting: *Deleted

## 2017-08-09 DIAGNOSIS — L97522 Non-pressure chronic ulcer of other part of left foot with fat layer exposed: Secondary | ICD-10-CM

## 2017-08-09 DIAGNOSIS — E1351 Other specified diabetes mellitus with diabetic peripheral angiopathy without gangrene: Secondary | ICD-10-CM

## 2017-08-09 MED FILL — LOSARTAN POTASSIUM 50 MG TA: 50 | 30 days supply | Qty: 30 | Fill #1

## 2017-08-09 NOTE — Telephone Encounter (Signed)
Dr. Amalia Hailey ordered pt to [facility] for evaluation of circulation and necrotic left great toe. 08/09/2017-Faxed referral, clinicals and demographics to [facility].

## 2017-08-20 ENCOUNTER — Telehealth: Payer: Self-pay | Admitting: *Deleted

## 2017-08-20 NOTE — Telephone Encounter (Signed)
Pt states a referral was to be made to cardiovascular and she had not heard from anyone. I told pt the referral was made on 08/17/2017 and usually took at least a week before pt was called, but she could call and set up her appt, gave [phone].

## 2017-08-22 ENCOUNTER — Encounter: Payer: Self-pay | Admitting: Endocrinology

## 2017-08-22 ENCOUNTER — Ambulatory Visit (INDEPENDENT_AMBULATORY_CARE_PROVIDER_SITE_OTHER): Payer: Self-pay | Admitting: Endocrinology

## 2017-08-22 VITALS — BP 140/62 | HR 89 | Wt 215.6 lb

## 2017-08-22 DIAGNOSIS — Z794 Long term (current) use of insulin: Secondary | ICD-10-CM

## 2017-08-22 DIAGNOSIS — H3500 Unspecified background retinopathy: Secondary | ICD-10-CM | POA: Insufficient documentation

## 2017-08-22 DIAGNOSIS — E1165 Type 2 diabetes mellitus with hyperglycemia: Secondary | ICD-10-CM

## 2017-08-22 DIAGNOSIS — IMO0002 Reserved for concepts with insufficient information to code with codable children: Secondary | ICD-10-CM

## 2017-08-22 DIAGNOSIS — E118 Type 2 diabetes mellitus with unspecified complications: Secondary | ICD-10-CM

## 2017-08-22 LAB — POCT GLYCOSYLATED HEMOGLOBIN (HGB A1C): HEMOGLOBIN A1C: 9.5

## 2017-08-22 MED ORDER — INSULIN GLARGINE 100 UNIT/ML SOLOSTAR PEN: 200.0000 [IU] | PEN_INJECTOR | SUBCUTANEOUS | 3 refills | Status: DC

## 2017-08-22 MED FILL — GABAPENTIN 300 MG CAPSULE: 300 | 30 days supply | Qty: 180 | Fill #1

## 2017-08-22 MED FILL — ?POTASSIUM CL ER 10MEQ TAB: 10 | 30 days supply | Qty: 30 | Fill #1

## 2017-08-22 MED FILL — AMLODIPINE BESYLATE 10 MG T: 10 | 30 days supply | Qty: 30 | Fill #3

## 2017-08-22 NOTE — Patient Instructions (Addendum)
good diet and exercise significantly improve the control of your diabetes.  please let me know if you wish to be referred to a dietician.  high blood sugar is very risky to your health.  you should see an eye doctor and dentist every year.  It is very important to get all recommended vaccinations.  Controlling your blood pressure and cholesterol drastically reduces the damage diabetes does to your body.  Those who smoke should quit.  Please discuss these with your doctor.  check your blood sugar twice a day.  vary the time of day when you check, between before the 3 meals, and at bedtime.  also check if you have symptoms of your blood sugar being too high or too low.  please keep a record of the readings and bring it to your next appointment here (or you can bring the meter itself).  You can write it on any piece of paper.  please call us sooner if your blood sugar goes below 70, or if you have a lot of readings over 200.  For now, please: Stop taking the novolog, and: Increase the lantus to 200 units each morning, and none in the evening.  Please come back for a follow-up appointment in 2 weeks.
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
## 2017-08-23 ENCOUNTER — Ambulatory Visit: Payer: Self-pay | Attending: Internal Medicine | Admitting: Internal Medicine

## 2017-08-23 ENCOUNTER — Encounter: Payer: Self-pay | Admitting: Internal Medicine

## 2017-08-23 VITALS — BP 147/68 | HR 84 | Temp 98.4°F | Resp 16 | Wt 218.0 lb

## 2017-08-23 DIAGNOSIS — E1140 Type 2 diabetes mellitus with diabetic neuropathy, unspecified: Secondary | ICD-10-CM | POA: Insufficient documentation

## 2017-08-23 DIAGNOSIS — E11319 Type 2 diabetes mellitus with unspecified diabetic retinopathy without macular edema: Secondary | ICD-10-CM | POA: Insufficient documentation

## 2017-08-23 DIAGNOSIS — F32A Depression, unspecified: Secondary | ICD-10-CM

## 2017-08-23 DIAGNOSIS — E1165 Type 2 diabetes mellitus with hyperglycemia: Secondary | ICD-10-CM

## 2017-08-23 DIAGNOSIS — Z7982 Long term (current) use of aspirin: Secondary | ICD-10-CM | POA: Insufficient documentation

## 2017-08-23 DIAGNOSIS — L97529 Non-pressure chronic ulcer of other part of left foot with unspecified severity: Secondary | ICD-10-CM | POA: Insufficient documentation

## 2017-08-23 DIAGNOSIS — E118 Type 2 diabetes mellitus with unspecified complications: Secondary | ICD-10-CM

## 2017-08-23 DIAGNOSIS — I10 Essential (primary) hypertension: Secondary | ICD-10-CM | POA: Insufficient documentation

## 2017-08-23 DIAGNOSIS — Z79899 Other long term (current) drug therapy: Secondary | ICD-10-CM | POA: Insufficient documentation

## 2017-08-23 DIAGNOSIS — IMO0002 Reserved for concepts with insufficient information to code with codable children: Secondary | ICD-10-CM

## 2017-08-23 DIAGNOSIS — Z794 Long term (current) use of insulin: Secondary | ICD-10-CM | POA: Insufficient documentation

## 2017-08-23 DIAGNOSIS — E785 Hyperlipidemia, unspecified: Secondary | ICD-10-CM | POA: Insufficient documentation

## 2017-08-23 DIAGNOSIS — R600 Localized edema: Secondary | ICD-10-CM | POA: Insufficient documentation

## 2017-08-23 DIAGNOSIS — E11621 Type 2 diabetes mellitus with foot ulcer: Secondary | ICD-10-CM | POA: Insufficient documentation

## 2017-08-23 DIAGNOSIS — K219 Gastro-esophageal reflux disease without esophagitis: Secondary | ICD-10-CM | POA: Insufficient documentation

## 2017-08-23 DIAGNOSIS — F329 Major depressive disorder, single episode, unspecified: Secondary | ICD-10-CM | POA: Insufficient documentation

## 2017-08-23 LAB — GLUCOSE, POCT (MANUAL RESULT ENTRY): POC Glucose: 128 mg/dL — AB (ref 70–99)

## 2017-08-23 MED ORDER — TRAZODONE HCL 50 MG PO TABS
25.0000 mg | ORAL_TABLET | Freq: Every evening | ORAL | 3 refills | Status: DC | PRN
Start: 2017-08-23 — End: 2018-02-05
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
## 2017-08-23 NOTE — Patient Instructions (Signed)
Start Trazodone at bedtime as discussed.

## 2017-08-24 DIAGNOSIS — L97529 Non-pressure chronic ulcer of other part of left foot with unspecified severity: Secondary | ICD-10-CM | POA: Insufficient documentation

## 2017-08-24 MED FILL — traZODone HCL 50 MG TABS: 50 | 30 days supply | Qty: 30 | Fill #0

## 2017-08-24 NOTE — Addendum Note (Signed)
Addended by: Karle Plumber B on: 08/24/2017 04:59 PM   Modules accepted: Orders

## 2017-08-27 ENCOUNTER — Telehealth: Payer: Self-pay | Admitting: Internal Medicine

## 2017-08-27 NOTE — Telephone Encounter (Signed)
Pt called requesting to speak to nurse to check on status of ultrasound appt. Please f/up

## 2017-08-28 ENCOUNTER — Telehealth: Payer: Self-pay | Admitting: Endocrinology

## 2017-08-28 NOTE — Telephone Encounter (Signed)
Patient stated that Dr Lo[facility] Drilling [facility] they stated they haven't received the medication for Lantus 200 units. Patient stated that the medication is making her feel like a zombie.  please advise

## 2017-08-28 NOTE — Telephone Encounter (Signed)
Pt is scheduled for her DVT on Thursday August 30, 2017 @ 10am. Pt is to arrive at [facility] at 945am. Tried contacting pt was unable to lvm will try contacting pt again

## 2017-08-29 MED FILL — TRUEPLUS PEN NDL 32GX5/32: 32G X 4 MM | 30 days supply | Qty: 100 | Fill #0

## 2017-08-29 MED FILL — TRUEPLUS PEN NDL 32GX5/32": 32G X 4 MM | 30 days supply | Qty: 100 | Fill #0

## 2017-08-29 MED FILL — $LANTUS SOLOSTAR 100 UNITS/: 100 | 30 days supply | Qty: 60 | Fill #0

## 2017-08-29 NOTE — Telephone Encounter (Signed)
Called patient but got no answer & no VM set up. I also called [facility] they did not receive fax so I will fax again.

## 2017-08-30 ENCOUNTER — Ambulatory Visit (HOSPITAL_COMMUNITY): Admission: RE | Admit: 2017-08-30 | Payer: Self-pay | Source: Ambulatory Visit

## 2017-08-30 NOTE — Telephone Encounter (Signed)
Tried contacting pt to inform her the DVT being rescheduled. Was unable to reach pt again due to VM not being set up.

## 2017-08-30 NOTE — Telephone Encounter (Signed)
Contacted the vas dept and reschedule the DVT. DVT is rescheduled for August 31, 2017 @ 3pm. Pt is to arrive at 245pm at [facility]. Will try an contact pt to make aware

## 2017-08-30 NOTE — Telephone Encounter (Signed)
Dr. Wynetta Emery still haven't been able to reach patient to give appointment information

## 2017-08-31 ENCOUNTER — Ambulatory Visit (HOSPITAL_COMMUNITY)
Admission: RE | Admit: 2017-08-31 | Discharge: 2017-08-31 | Disposition: A | Discharge status: Home / Self Care | Payer: Self-pay | Source: Ambulatory Visit | Attending: Internal Medicine | Admitting: Internal Medicine

## 2017-08-31 DIAGNOSIS — R600 Localized edema: Secondary | ICD-10-CM | POA: Insufficient documentation

## 2017-08-31 NOTE — Telephone Encounter (Signed)
Tried contacting patient and was able to get in touch with her patient is aware of appointment and pt states she will make it to her appointment

## 2017-08-31 NOTE — Progress Notes (Signed)
VASCULAR LAB PRELIMINARY  PRELIMINARY  PRELIMINARY  PRELIMINARY  Left lower extremity venous duplex completed.    Preliminary report: Left - No obvious evidence of DVT. There is a small chronic thrombus in the saphenofemoral junction with not propagation to the common femoral or greater saphenous veins.  Xolani Degracia, RVS 08/31/2017, 3:53 PM

## 2017-09-02 ENCOUNTER — Telehealth: Payer: Self-pay | Admitting: Internal Medicine

## 2017-09-02 MED ORDER — RIVAROXABAN 10 MG PO TABS: 10.0000 mg | ORAL_TABLET | Freq: Every day | ORAL | 0 refills | Status: DC

## 2017-09-02 MED ORDER — DOCUSATE SODIUM 100 MG PO CAPS: 100.0000 mg | ORAL_CAPSULE | Freq: Every day | ORAL | 4 refills | Status: DC

## 2017-09-02 NOTE — Telephone Encounter (Signed)
PC placed to pt today. I went over results of ultra sound of LT LE. This was negative for DVT but did reveal chronic superficial thrombus involving the saphenofemoral junction. Pt informed that clot at this junction has the potential to propagate to one of the deep veins. Recommendation is low dose blood thinner(Xarelto 20 mg) x 4-6 wks. Pt to avoid falls. Watch for any bruising or bleeding.  Pt also requested something for constipation. Will start Colace.

## 2017-09-03 ENCOUNTER — Encounter: Payer: Self-pay | Admitting: Endocrinology

## 2017-09-03 ENCOUNTER — Ambulatory Visit (INDEPENDENT_AMBULATORY_CARE_PROVIDER_SITE_OTHER): Payer: Self-pay | Admitting: Endocrinology

## 2017-09-03 DIAGNOSIS — Z794 Long term (current) use of insulin: Secondary | ICD-10-CM

## 2017-09-03 DIAGNOSIS — E1165 Type 2 diabetes mellitus with hyperglycemia: Secondary | ICD-10-CM

## 2017-09-03 DIAGNOSIS — IMO0002 Reserved for concepts with insufficient information to code with codable children: Secondary | ICD-10-CM

## 2017-09-03 DIAGNOSIS — E118 Type 2 diabetes mellitus with unspecified complications: Secondary | ICD-10-CM

## 2017-09-03 MED ORDER — INSULIN GLARGINE 100 UNIT/ML SOLOSTAR PEN: 180.0000 [IU] | PEN_INJECTOR | SUBCUTANEOUS | 3 refills | Status: DC

## 2017-09-03 NOTE — Progress Notes (Signed)
Subjective:    Patient ID: Katherine [name], female    DOB: [birth year], 61 y.o.   MRN: [id number]  HPI Pt returns for f/u of diabetes mellitus: DM type: 1 Dx'ed: 2263 Complications: polyneuropathy, gastroparesis, foot ulcer, and DR Therapy: insulin since 2014 GDM: never DKA: once (2016) Severe hypoglycemia: never Pancreatitis: never Pancreatic imaging: normal on 2016 CT Other: she gets insulin through [facility]; she declines multiple daily injections; she did not tolerate levemir (rash). Interval history: no cbg record, but states cbg's are often low.  She takes lantus as rx'ed.   Past Medical History:  Diagnosis Date  . Boil of buttock   . Diabetes mellitus   . Hyperlipidemia   . Hypertension     Past Surgical History:  Procedure Laterality Date  . APPENDECTOMY      Social History   Social History  . Marital status: Married    Spouse name: N/A  . Number of children: N/A  . Years of education: N/A   Occupational History  . Not on file.   Social History Main Topics  . Smoking status: Never Smoker  . Smokeless tobacco: Never Used  . Alcohol use No  . Drug use: No  . Sexual activity: Not on file   Other Topics Concern  . Not on file   Social History Narrative  . No narrative on file    Current Outpatient Prescriptions on File Prior to Visit  Medication Sig Dispense Refill  . amLODipine (NORVASC) 10 MG tablet Take 1 tablet (10 mg total) by mouth daily. 90 tablet 3  . amoxicillin-clavulanate (AUGMENTIN) 875-125 MG tablet Take 1 tablet by mouth 2 (two) times daily. 20 tablet 0  . aspirin (ASPIRIN CHILDRENS) 81 MG chewable tablet Chew 1 tablet (81 mg total) by mouth daily. 90 tablet 3  . Blood Glucose Monitoring Suppl (TRUE METRIX METER) w/Device KIT USE AS INSTRUCTED 1 kit 0  . Blood Glucose Monitoring Suppl (TRUE METRIX METER) w/Device KIT Use as directed 1 kit 0  . docusate sodium (COLACE) 100 MG capsule Take 1 capsule (100 mg total) by mouth daily. 30 capsule 4    . gabapentin (NEURONTIN) 300 MG capsule Take 2 capsules (600 mg total) by mouth 3 (three) times daily. 180 capsule 5  . glucose blood (TRUE METRIX BLOOD GLUCOSE TEST) test strip Use as instructed 100 each 12  . hydrALAZINE (APRESOLINE) 100 MG tablet Take 1 tablet (100 mg total) by mouth 3 (three) times daily. 90 tablet 2  . hydrochlorothiazide (HYDRODIURIL) 25 MG tablet Take 1 tablet (25 mg total) by mouth daily. 30 tablet 6  . HYDROcodone-acetaminophen (NORCO/VICODIN) 5-325 MG tablet Take 1 tablet by mouth every 6 (six) hours as needed for moderate pain. 30 tablet 0  . Insulin Pen Needle (ULTICARE MICRO PEN NEEDLES) 32G X 4 MM MISC Use as directed 100 each 11  . losartan (COZAAR) 50 MG tablet Take 1 tablet (50 mg total) by mouth daily. 30 tablet 6  . meclizine (ANTIVERT) 12.5 MG tablet Take 1 tablet (12.5 mg total) by mouth 3 (three) times daily as needed for dizziness. 30 tablet 0  . ondansetron (ZOFRAN) 4 MG tablet Take 1 tablet (4 mg total) by mouth every 8 (eight) hours as needed for nausea or vomiting. 20 tablet 0  . OVER THE COUNTER MEDICATION Take 1 tablet by mouth daily. Over the counter supplement with iron and other ingredients    . potassium chloride (K-DUR) 10 MEQ tablet Take 1 tablet (10 mEq  total) by mouth daily. 30 tablet 6  . pravastatin (PRAVACHOL) 20 MG tablet Take 1 tablet (20 mg total) by mouth daily. (Patient not taking: Reported on 07/17/2017) 90 tablet 3  . Pyridoxine HCl (VITAMIN B-6 PO) Take 1 tablet by mouth daily.    . rivaroxaban (XARELTO) 10 MG TABS tablet Take 1 tablet (10 mg total) by mouth daily. Hold off on taking Aspirin while on this medication 30 tablet 0  . sulfamethoxazole-trimethoprim (BACTRIM DS,SEPTRA DS) 800-160 MG tablet Take 1 tablet by mouth 2 (two) times daily. (Patient not taking: Reported on 07/17/2017) 20 tablet 0  . traZODone (DESYREL) 50 MG tablet Take 0.5-1 tablets (25-50 mg total) by mouth at bedtime as needed for sleep. 30 tablet 3  . TRUEPLUS  LANCETS 28G MISC Use as directed 100 each 12  . TRUEPLUS LANCETS 28G MISC Use as directed 100 each 6  . [DISCONTINUED] sitaGLIPtin (JANUVIA) 100 MG tablet Take 100 mg by mouth daily.     Current Facility-Administered Medications on File Prior to Visit  Medication Dose Route Frequency Provider Last Rate Last Dose  . 0.9 %  sodium chloride infusion  500 mL Intravenous Continuous Ladene Artist, MD        Allergies  Allergen Reactions  . Other Swelling    Seaweed= swelling on arms, hands and face  . Metformin And Related Nausea Only    Family History  Problem Relation Age of Onset  . Diabetes Father   . Hypertension Father   . Kidney disease Mother   . Hypertension Mother   . Cancer Maternal Grandmother   . Hypertension Maternal Grandfather   . Colon cancer Neg Hx     BP (!) 160/90   Pulse 84   Wt 216 lb 9.6 oz (98.2 kg)   SpO2 94%   BMI 29.38 kg/m   Review of Systems Denies LOC    Objective:   Physical Exam VITAL SIGNS:  See vs page GENERAL: no distress.  In wheelchair. Pulses: foot pulses are intact bilaterally.   MSK: no deformity of the feet or ankles.  CV: 2+ left leg edema (1+ on the right) Skin:  The left great toe is bandaged.  normal color and temp on the feet and ankles Neuro: sensation is intact to touch on the feet and ankles, but decreased from normal.  Ext: There is bilateral onychomycosis of the toenails.        Assessment & Plan:  Insulin-requiring type 2 DM: now overcontrolled.    Patient Instructions  check your blood sugar twice a day.  vary the time of day when you check, between before the 3 meals, and at bedtime.  also check if you have symptoms of your blood sugar being too high or too low.  please keep a record of the readings and bring it to your next appointment here (or you can bring the meter itself).  You can write it on any piece of paper.  please call us sooner if your blood sugar goes below 70, or if you have a lot of readings over  200.   Please decrease the lantus to 180 units each morning, and none in the evening.   Please come back for a follow-up appointment in 1 month.

## 2017-09-03 NOTE — Patient Instructions (Addendum)
check your blood sugar twice a day.  vary the time of day when you check, between before the 3 meals, and at bedtime.  also check if you have symptoms of your blood sugar being too high or too low.  please keep a record of the readings and bring it to your next appointment here (or you can bring the meter itself).  You can write it on any piece of paper.  please call us sooner if your blood sugar goes below 70, or if you have a lot of readings over 200.   Please decrease the lantus to 180 units each morning, and none in the evening.   Please come back for a follow-up appointment in 1 month.

## 2017-09-05 ENCOUNTER — Ambulatory Visit (HOSPITAL_COMMUNITY)
Admission: RE | Admit: 2017-09-05 | Discharge: 2017-09-05 | Disposition: A | Discharge status: Home / Self Care | Payer: Self-pay | Source: Ambulatory Visit | Attending: Cardiovascular Disease | Admitting: Cardiovascular Disease

## 2017-09-05 ENCOUNTER — Ambulatory Visit (INDEPENDENT_AMBULATORY_CARE_PROVIDER_SITE_OTHER): Payer: Self-pay | Admitting: Cardiovascular Disease

## 2017-09-05 ENCOUNTER — Encounter: Payer: Self-pay | Admitting: Cardiovascular Disease

## 2017-09-05 VITALS — BP 199/88 | HR 93 | Ht 72.0 in | Wt 215.8 lb

## 2017-09-05 DIAGNOSIS — I70229 Atherosclerosis of native arteries of extremities with rest pain, unspecified extremity: Secondary | ICD-10-CM

## 2017-09-05 DIAGNOSIS — I998 Other disorder of circulatory system: Secondary | ICD-10-CM

## 2017-09-05 DIAGNOSIS — I10 Essential (primary) hypertension: Secondary | ICD-10-CM | POA: Insufficient documentation

## 2017-09-05 DIAGNOSIS — R0989 Other specified symptoms and signs involving the circulatory and respiratory systems: Secondary | ICD-10-CM

## 2017-09-05 DIAGNOSIS — E785 Hyperlipidemia, unspecified: Secondary | ICD-10-CM | POA: Insufficient documentation

## 2017-09-05 DIAGNOSIS — E119 Type 2 diabetes mellitus without complications: Secondary | ICD-10-CM | POA: Insufficient documentation

## 2017-09-05 LAB — CBC WITH DIFFERENTIAL/PLATELET
BASOS ABS: 0 10*3/uL (ref 0.0–0.2)
Basos: 0 %
EOS (ABSOLUTE): 0.1 10*3/uL (ref 0.0–0.4)
Eos: 2 %
Hematocrit: 31.9 % — ABNORMAL LOW (ref 34.0–46.6)
Hemoglobin: 10.4 g/dL — ABNORMAL LOW (ref 11.1–15.9)
IMMATURE GRANS (ABS): 0 10*3/uL (ref 0.0–0.1)
Immature Granulocytes: 0 %
LYMPHS ABS: 1.3 10*3/uL (ref 0.7–3.1)
LYMPHS: 18 %
MCH: 27.1 pg (ref 26.6–33.0)
MCHC: 32.6 g/dL (ref 31.5–35.7)
MCV: 83 fL (ref 79–97)
Monocytes Absolute: 0.4 10*3/uL (ref 0.1–0.9)
Monocytes: 6 %
NEUTROS ABS: 5.2 10*3/uL (ref 1.4–7.0)
Neutrophils: 74 %
PLATELETS: 365 10*3/uL (ref 150–379)
RBC: 3.84 x10E6/uL (ref 3.77–5.28)
RDW: 13.7 % (ref 12.3–15.4)
WBC: 7.1 10*3/uL (ref 3.4–10.8)

## 2017-09-05 LAB — BASIC METABOLIC PANEL
BUN/Creatinine Ratio: 16 (ref 12–28)
BUN: 13 mg/dL (ref 8–27)
CALCIUM: 9.1 mg/dL (ref 8.7–10.3)
CHLORIDE: 97 mmol/L (ref 96–106)
CO2: 28 mmol/L (ref 20–29)
Creatinine, Ser: 0.79 mg/dL (ref 0.57–1.00)
GFR calc non Af Amer: 81 mL/min/{1.73_m2} (ref >59)
GFR, EST AFRICAN AMERICAN: 93 mL/min/{1.73_m2} (ref >59)
Glucose: 137 mg/dL — ABNORMAL HIGH (ref 65–99)
POTASSIUM: 4.1 mmol/L (ref 3.5–5.2)
Sodium: 139 mmol/L (ref 134–144)

## 2017-09-05 LAB — PROTIME-INR
INR: 1.1 (ref 0.8–1.2)
Prothrombin Time: 11.8 s (ref 9.1–12.0)

## 2017-09-05 LAB — APTT: aPTT: 33 s (ref 24–33)

## 2017-09-05 NOTE — Progress Notes (Signed)
09/05/2017 Katherine [name]   15-Apr-1956  [id number]  Primary Physician Katherine Pier, MD Primary Cardiologist: Katherine Harp MD Katherine [name]  HPI:  Katherine [name] is a 61 y.o. 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 She was referred to me by Katherine [name] for peripheral vascular evaluation because of critical limb ischemia. She does have a history of treated hypertension, diabetes and hyperlipidemia. She has never had a heart attack or stroke. She does have a gangrenous left great toe is an ulcer there for several months having undergone several rounds of antibiotic therapy. Her left toe is painful. She had recent venous Doppler study that did not show evidence of DVT.   Current Meds  Medication Sig  . amLODipine (NORVASC) 10 MG tablet Take 1 tablet (10 mg total) by mouth daily.  Marland Kitchen aspirin (ASPIRIN CHILDRENS) 81 MG chewable tablet Chew 1 tablet (81 mg total) by mouth daily.  . Blood Glucose Monitoring Suppl (TRUE METRIX METER) w/Device KIT Use as directed  . docusate sodium (COLACE) 100 MG capsule Take 1 capsule (100 mg total) by mouth daily.  Marland Kitchen gabapentin (NEURONTIN) 300 MG capsule Take 2 capsules (600 mg total) by mouth 3 (three) times daily.  Marland Kitchen glucose blood (TRUE METRIX BLOOD GLUCOSE TEST) test strip Use as instructed  . hydrALAZINE (APRESOLINE) 100 MG tablet Take 1 tablet (100 mg total) by mouth 3 (three) times daily.  . hydrochlorothiazide (HYDRODIURIL) 25 MG tablet Take 1 tablet (25 mg total) by mouth daily.  Marland Kitchen HYDROcodone-acetaminophen (NORCO/VICODIN) 5-325 MG tablet Take 1 tablet by mouth every 6 (six) hours as needed for moderate pain.  . Insulin Glargine (LANTUS SOLOSTAR) 100 UNIT/ML Solostar Pen Inject 180 Units into the skin every morning. And pen needles 1/day.  . Insulin Pen Needle (ULTICARE MICRO PEN NEEDLES) 32G X 4 MM MISC Use as directed  . losartan (COZAAR) 50 MG tablet Take 1 tablet (50 mg  total) by mouth daily.  . meclizine (ANTIVERT) 12.5 MG tablet Take 1 tablet (12.5 mg total) by mouth 3 (three) times daily as needed for dizziness.  . ondansetron (ZOFRAN) 4 MG tablet Take 1 tablet (4 mg total) by mouth every 8 (eight) hours as needed for nausea or vomiting.  Marland Kitchen OVER THE COUNTER MEDICATION Take 1 tablet by mouth daily. Over the counter supplement with iron and other ingredients  . potassium chloride (K-DUR) 10 MEQ tablet Take 1 tablet (10 mEq total) by mouth daily.  . pravastatin (PRAVACHOL) 20 MG tablet Take 1 tablet (20 mg total) by mouth daily.  . Pyridoxine HCl (VITAMIN B-6 PO) Take 1 tablet by mouth daily.  . rivaroxaban (XARELTO) 10 MG TABS tablet Take 1 tablet (10 mg total) by mouth daily. Hold off on taking Aspirin while on this medication  . traZODone (DESYREL) 50 MG tablet Take 0.5-1 tablets (25-50 mg total) by mouth at bedtime as needed for sleep.  . TRUEPLUS LANCETS 28G MISC Use as directed   Current Facility-Administered Medications for the 09/05/17 encounter (Office Visit) with Katherine Harp, MD  Medication  . 0.9 %  sodium chloride infusion     Allergies  Allergen Reactions  . Other Swelling    Seaweed= swelling on arms, hands and face  . Metformin And Related Nausea Only    Social History   Social History  . Marital status: Married    Spouse name: N/A  . Number of children:  N/A  . Years of education: N/A   Occupational History  . Not on file.   Social History Main Topics  . Smoking status: Never Smoker  . Smokeless tobacco: Never Used  . Alcohol use No  . Drug use: No  . Sexual activity: Not on file   Other Topics Concern  . Not on file   Social History Narrative  . No narrative on file     Review of Systems: General: negative for chills, fever, night sweats or weight changes.  Cardiovascular: negative for chest pain, dyspnea on exertion, edema, orthopnea, palpitations, paroxysmal nocturnal dyspnea or shortness of  breath Dermatological: negative for rash Respiratory: negative for cough or wheezing Urologic: negative for hematuria Abdominal: negative for nausea, vomiting, diarrhea, bright red blood per rectum, melena, or hematemesis Neurologic: negative for visual changes, syncope, or dizziness All other systems reviewed and are otherwise negative except as noted above.    Blood pressure (!) 199/88, pulse 93, height 6' (1.829 m), weight 215 lb 12.8 oz (97.9 kg).  General appearance: alert and no distress Neck: no adenopathy, no JVD, supple, symmetrical, trachea midline, thyroid not enlarged, symmetric, no tenderness/mass/nodules and Bilateral carotid bruits Lungs: clear to auscultation bilaterally Heart: regular rate and rhythm, S1, S2 normal, no murmur, click, rub or gallop Extremities: Edematous left foot with gangrenous left great toe Pulses: 2+ right pedal pulses, absent left pedal pulses. Skin: 2+ right pedal pulses, absent left pedal pulses Neurologic: Alert and oriented X 3, normal strength and tone. Normal symmetric reflexes. Normal coordination and gait  EKG not performed today.  ASSESSMENT AND PLAN:   HYPERTENSION, BENIGN ESSENTIAL History of essential hypertension with blood pressure measured 199/88. She is on amlodipine, hydralazine and losartan. Continue current meds at current dosing. I suspect that her blood pressures elevated because of pain and concern about her left leg.  Dyslipidemia History of hyperlipidemia on statin therapy followed by her PCP  Ulcer of left foot (HCC) [name]  referred to me by Katherine [name] for evaluation of critical limb ischemia. She's had a left great toe ulcer for several months and was last seen by Katherine [name] in the office on 08/08/17. She has undergone 2 rounds of antibiotic therapy. She denied claudication prior to this. She's been diabetic for last 20 years. She has a good pulse on the right, absent left pedal pulses. I'm going to get lower extremity  arterial Doppler studies on the left. We'll function is acceptable. We'll arrange for her to undergo angiography on Monday of next week.       J.  MD FACP,FACC,FAHA, FSCAI 09/05/2017 10:52 AM 

## 2017-09-05 NOTE — Assessment & Plan Note (Signed)
History of hyperlipidemia on statin therapy followed by her PCP. 

## 2017-09-05 NOTE — Assessment & Plan Note (Signed)
History of essential hypertension with blood pressure measured 199/88. She is on amlodipine, hydralazine and losartan. Continue current meds at current dosing. I suspect that her blood pressures elevated because of pain and concern about her left leg.

## 2017-09-05 NOTE — Assessment & Plan Note (Signed)
Katherine [name]  referred to me by Dr. Amalia Hailey for evaluation of critical limb ischemia. She's had a left great toe ulcer for several months and was last seen by Dr. Amalia Hailey in the office on 08/08/17. She has undergone 2 rounds of antibiotic therapy. She denied claudication prior to this. She's been diabetic for last 20 years. She has a good pulse on the right, absent left pedal pulses. I'm going to get lower extremity arterial Doppler studies on the left. We'll function is acceptable. We'll arrange for her to undergo angiography on Monday of next week.

## 2017-09-05 NOTE — Patient Instructions (Addendum)
   [facility] [address] [place] [address] Dept: [phone] Loc: [place]  09/05/2017  You are scheduled for a Peripheral Angiogram on Monday, October 22 with Dr. Quay Burow.  1. Please arrive at the [facility] (Main Entrance A) at [facility]: [address] [place], [place] [address] at 5:30 AM (two hours before your procedure to ensure your preparation). Free valet parking service is available.   Special note: Every effort is made to have your procedure done on time. Please understand that emergencies sometimes delay scheduled procedures.  2. Diet: Do not eat or drink anything after midnight prior to your procedure except sips of water to take medications.  3. Please have labs in our office today.  4. Medication instructions in preparation for your procedure:  DO NOT START XARELTO.  Take only 90 units of insulin the night before your procedure. Do not take any insulin on the day of the procedure.  On the morning of your procedure, take your Aspirin and any morning medicines NOT listed above.  You may use sips of water.  5. Plan for one night stay--bring personal belongings. 6. Bring a current list of your medications and current insurance cards. 7. You MUST have a responsible person to drive you home. 8. Someone MUST be with you the first 24 hours after you arrive home or your discharge will be delayed. 9. Please wear clothes that are easy to get on and off and wear slip-on shoes.  Thank you for allowing [place] to care for you!   -- [facility]    Post-procedure testing: Your physician has requested that you have a lower extremity arterial duplex. During this test, ultrasound is used to evaluate arterial blood flow in the legs. Allow one hour for this exam. There are no restrictions or special instructions.  Your  physician has requested that you have an ankle brachial index (ABI). During this test an ultrasound and blood pressure cuff are used to evaluate the arteries that supply the arms and legs with blood. Allow thirty minutes for this exam. There are no restrictions or special instructions. --1-2 weeks post procedure  Follow-up: Your physician recommends that you schedule a follow-up appointment in: about 2 weeks after your procedure with Dr. Gwenlyn Found.

## 2017-09-07 ENCOUNTER — Other Ambulatory Visit: Payer: Self-pay | Admitting: Cardiovascular Disease

## 2017-09-07 DIAGNOSIS — I70229 Atherosclerosis of native arteries of extremities with rest pain, unspecified extremity: Secondary | ICD-10-CM

## 2017-09-07 DIAGNOSIS — I998 Other disorder of circulatory system: Secondary | ICD-10-CM

## 2017-09-10 ENCOUNTER — Encounter (HOSPITAL_COMMUNITY)
Admission: RE | Disposition: A | Discharge status: Home / Self Care | Payer: Self-pay | Source: Home / Self Care | Attending: Cardiovascular Disease

## 2017-09-10 ENCOUNTER — Inpatient Hospital Stay (HOSPITAL_COMMUNITY)
Admission: RE | Admit: 2017-09-10 | Discharge: 2017-09-13 | DRG: 271 | Disposition: A | Discharge status: Home / Self Care | Payer: Self-pay | Attending: Cardiology | Admitting: Cardiology

## 2017-09-10 ENCOUNTER — Encounter (HOSPITAL_COMMUNITY): Payer: Self-pay | Admitting: General Practice

## 2017-09-10 DIAGNOSIS — K219 Gastro-esophageal reflux disease without esophagitis: Secondary | ICD-10-CM | POA: Diagnosis present

## 2017-09-10 DIAGNOSIS — Z7901 Long term (current) use of anticoagulants: Secondary | ICD-10-CM

## 2017-09-10 DIAGNOSIS — D509 Iron deficiency anemia, unspecified: Secondary | ICD-10-CM | POA: Diagnosis present

## 2017-09-10 DIAGNOSIS — I70262 Atherosclerosis of native arteries of extremities with gangrene, left leg: Secondary | ICD-10-CM | POA: Diagnosis present

## 2017-09-10 DIAGNOSIS — E1152 Type 2 diabetes mellitus with diabetic peripheral angiopathy with gangrene: Principal | ICD-10-CM | POA: Diagnosis present

## 2017-09-10 DIAGNOSIS — I10 Essential (primary) hypertension: Secondary | ICD-10-CM | POA: Diagnosis present

## 2017-09-10 DIAGNOSIS — I70229 Atherosclerosis of native arteries of extremities with rest pain, unspecified extremity: Secondary | ICD-10-CM | POA: Diagnosis present

## 2017-09-10 DIAGNOSIS — Z91018 Allergy to other foods: Secondary | ICD-10-CM

## 2017-09-10 DIAGNOSIS — L97529 Non-pressure chronic ulcer of other part of left foot with unspecified severity: Secondary | ICD-10-CM | POA: Diagnosis present

## 2017-09-10 DIAGNOSIS — E1165 Type 2 diabetes mellitus with hyperglycemia: Secondary | ICD-10-CM | POA: Diagnosis present

## 2017-09-10 DIAGNOSIS — Z888 Allergy status to other drugs, medicaments and biological substances status: Secondary | ICD-10-CM

## 2017-09-10 DIAGNOSIS — Z79899 Other long term (current) drug therapy: Secondary | ICD-10-CM

## 2017-09-10 DIAGNOSIS — I7092 Chronic total occlusion of artery of the extremities: Secondary | ICD-10-CM | POA: Diagnosis present

## 2017-09-10 DIAGNOSIS — E11621 Type 2 diabetes mellitus with foot ulcer: Secondary | ICD-10-CM | POA: Diagnosis present

## 2017-09-10 DIAGNOSIS — Z1211 Encounter for screening for malignant neoplasm of colon: Secondary | ICD-10-CM

## 2017-09-10 DIAGNOSIS — E11649 Type 2 diabetes mellitus with hypoglycemia without coma: Secondary | ICD-10-CM | POA: Diagnosis present

## 2017-09-10 DIAGNOSIS — Z7982 Long term (current) use of aspirin: Secondary | ICD-10-CM

## 2017-09-10 DIAGNOSIS — I998 Other disorder of circulatory system: Secondary | ICD-10-CM | POA: Diagnosis present

## 2017-09-10 DIAGNOSIS — E785 Hyperlipidemia, unspecified: Secondary | ICD-10-CM | POA: Diagnosis present

## 2017-09-10 DIAGNOSIS — Z794 Long term (current) use of insulin: Secondary | ICD-10-CM

## 2017-09-10 DIAGNOSIS — E118 Type 2 diabetes mellitus with unspecified complications: Secondary | ICD-10-CM

## 2017-09-10 DIAGNOSIS — I70248 Atherosclerosis of native arteries of left leg with ulceration of other part of lower left leg: Secondary | ICD-10-CM

## 2017-09-10 HISTORY — DX: Depression, unspecified: F32.A

## 2017-09-10 HISTORY — DX: Type 2 diabetes mellitus with diabetic neuropathy, unspecified: E11.40

## 2017-09-10 HISTORY — PX: LOWER EXTREMITY INTERVENTION: CATH118252

## 2017-09-10 HISTORY — PX: PERIPHERAL VASCULAR ATHERECTOMY: CATH118256

## 2017-09-10 HISTORY — DX: Major depressive disorder, single episode, unspecified: F32.9

## 2017-09-10 HISTORY — DX: Non-pressure chronic ulcer of other part of unspecified foot with unspecified severity: L97.509

## 2017-09-10 HISTORY — DX: Type 2 diabetes mellitus without complications: E11.9

## 2017-09-10 HISTORY — DX: Type 2 diabetes mellitus with ketoacidosis without coma: E11.10

## 2017-09-10 LAB — GLUCOSE, CAPILLARY
Glucose-Capillary: 110 mg/dL — ABNORMAL HIGH (ref 65–99)
Glucose-Capillary: 106 mg/dL — ABNORMAL HIGH (ref 65–99)
Glucose-Capillary: 92 mg/dL (ref 65–99)
Glucose-Capillary: 103 mg/dL — ABNORMAL HIGH (ref 65–99)
Glucose-Capillary: 137 mg/dL — ABNORMAL HIGH (ref 65–99)

## 2017-09-10 LAB — POCT ACTIVATED CLOTTING TIME
ACTIVATED CLOTTING TIME: 241 s
Activated Clotting Time: 241 seconds
Activated Clotting Time: 241 seconds

## 2017-09-10 SURGERY — LOWER EXTREMITY INTERVENTION
Anesthesia: LOCAL

## 2017-09-10 MED ORDER — AMLODIPINE BESYLATE 10 MG PO TABS
10.0000 mg | ORAL_TABLET | Freq: Every day | ORAL | Status: DC
  Administered 2017-09-11 – 2017-09-13 (×3): 10 mg via ORAL
  Filled 2017-09-10 (×3): qty 1

## 2017-09-10 MED ORDER — CLOPIDOGREL BISULFATE 300 MG PO TABS
ORAL_TABLET | ORAL | Status: DC | PRN
  Administered 2017-09-10: 300 mg via ORAL

## 2017-09-10 MED ORDER — HEPARIN SODIUM (PORCINE) 1000 UNIT/ML IJ SOLN
INTRAMUSCULAR | Status: AC
  Filled 2017-09-10: qty 1

## 2017-09-10 MED ORDER — SODIUM CHLORIDE 0.9 % IV SOLN: INTRAVENOUS | Status: AC

## 2017-09-10 MED ORDER — SODIUM CHLORIDE 0.9 % WEIGHT BASED INFUSION: 1.0000 mL/kg/h | INTRAVENOUS | Status: DC

## 2017-09-10 MED ORDER — HYDRALAZINE HCL 50 MG PO TABS
100.0000 mg | ORAL_TABLET | Freq: Three times a day (TID) | ORAL | Status: DC
  Administered 2017-09-10 – 2017-09-13 (×8): 100 mg via ORAL
  Filled 2017-09-10 (×9): qty 2

## 2017-09-10 MED ORDER — MECLIZINE HCL 25 MG PO TABS
12.5000 mg | ORAL_TABLET | Freq: Three times a day (TID) | ORAL | Status: DC | PRN
  Filled 2017-09-10: qty 1

## 2017-09-10 MED ORDER — ADULT MULTIVITAMIN W/MINERALS CH
1.0000 | ORAL_TABLET | Freq: Every day | ORAL | Status: DC
  Administered 2017-09-10 – 2017-09-13 (×4): 1 via ORAL
  Filled 2017-09-10 (×4): qty 1

## 2017-09-10 MED ORDER — ASPIRIN 81 MG PO CHEW: 81.0000 mg | CHEWABLE_TABLET | ORAL | Status: DC

## 2017-09-10 MED ORDER — ANGIOPLASTY BOOK
Freq: Once | Status: AC
  Administered 2017-09-11: 04:00:00
  Filled 2017-09-10: qty 1

## 2017-09-10 MED ORDER — INSULIN GLARGINE 100 UNIT/ML ~~LOC~~ SOLN
90.0000 [IU] | Freq: Every day | SUBCUTANEOUS | Status: DC
  Filled 2017-09-10: qty 0.9

## 2017-09-10 MED ORDER — DOCUSATE SODIUM 100 MG PO CAPS
100.0000 mg | ORAL_CAPSULE | Freq: Every day | ORAL | Status: DC
  Administered 2017-09-11 – 2017-09-13 (×3): 100 mg via ORAL
  Filled 2017-09-10 (×3): qty 1

## 2017-09-10 MED ORDER — VIPERSLIDE LUBRICANT OPTIME
TOPICAL | Status: DC | PRN
  Administered 2017-09-10: 09:00:00 via SURGICAL_CAVITY

## 2017-09-10 MED ORDER — ATORVASTATIN CALCIUM 80 MG PO TABS
80.0000 mg | ORAL_TABLET | Freq: Every day | ORAL | Status: DC
  Administered 2017-09-10 – 2017-09-12 (×3): 80 mg via ORAL
  Filled 2017-09-10 (×3): qty 1

## 2017-09-10 MED ORDER — SODIUM CHLORIDE 0.9% FLUSH: 3.0000 mL | INTRAVENOUS | Status: DC | PRN

## 2017-09-10 MED ORDER — ASPIRIN 81 MG PO CHEW: 81.0000 mg | CHEWABLE_TABLET | Freq: Every day | ORAL | Status: DC

## 2017-09-10 MED ORDER — SODIUM CHLORIDE 0.9 % WEIGHT BASED INFUSION
3.0000 mL/kg/h | INTRAVENOUS | Status: DC
  Administered 2017-09-10: 3 mL/kg/h via INTRAVENOUS

## 2017-09-10 MED ORDER — NITROGLYCERIN IN D5W 200-5 MCG/ML-% IV SOLN
INTRAVENOUS | Status: AC
  Filled 2017-09-10: qty 250

## 2017-09-10 MED ORDER — TRAZODONE 25 MG HALF TABLET
25.0000 mg | ORAL_TABLET | Freq: Every evening | ORAL | Status: DC | PRN
  Filled 2017-09-10: qty 2

## 2017-09-10 MED ORDER — HEPARIN (PORCINE) IN NACL 2-0.9 UNIT/ML-% IJ SOLN
INTRAMUSCULAR | Status: AC | PRN
  Administered 2017-09-10: 1000 mL

## 2017-09-10 MED ORDER — ONDANSETRON HCL 4 MG/2ML IJ SOLN: 4.0000 mg | Freq: Four times a day (QID) | INTRAMUSCULAR | Status: DC | PRN

## 2017-09-10 MED ORDER — INSULIN GLARGINE 100 UNIT/ML SOLOSTAR PEN: 180.0000 [IU] | PEN_INJECTOR | Freq: Every day | SUBCUTANEOUS | Status: DC

## 2017-09-10 MED ORDER — ASPIRIN EC 81 MG PO TBEC
81.0000 mg | DELAYED_RELEASE_TABLET | Freq: Every day | ORAL | Status: DC
  Administered 2017-09-11 – 2017-09-12 (×2): 81 mg via ORAL
  Filled 2017-09-10 (×2): qty 1

## 2017-09-10 MED ORDER — SODIUM CHLORIDE 0.9% FLUSH
3.0000 mL | Freq: Two times a day (BID) | INTRAVENOUS | Status: DC
  Administered 2017-09-10 – 2017-09-13 (×6): 3 mL via INTRAVENOUS

## 2017-09-10 MED ORDER — POTASSIUM CHLORIDE CRYS ER 10 MEQ PO TBCR
10.0000 meq | EXTENDED_RELEASE_TABLET | Freq: Every day | ORAL | Status: DC
  Administered 2017-09-11 – 2017-09-13 (×3): 10 meq via ORAL
  Filled 2017-09-10 (×5): qty 1

## 2017-09-10 MED ORDER — HYDROCODONE-ACETAMINOPHEN 5-325 MG PO TABS
1.0000 | ORAL_TABLET | Freq: Four times a day (QID) | ORAL | Status: DC | PRN
Start: 2017-09-10 — End: 2017-09-13
  Administered 2017-09-13: 1 via ORAL
  Filled 2017-09-10 (×2): qty 1

## 2017-09-10 MED ORDER — SODIUM CHLORIDE 0.9 % IV SOLN: 250.0000 mL | INTRAVENOUS | Status: DC | PRN

## 2017-09-10 MED ORDER — HYDROCHLOROTHIAZIDE 25 MG PO TABS
25.0000 mg | ORAL_TABLET | Freq: Every day | ORAL | Status: DC
Start: 2017-09-11 — End: 2017-09-13
  Administered 2017-09-11 – 2017-09-13 (×3): 25 mg via ORAL
  Filled 2017-09-10 (×3): qty 1

## 2017-09-10 MED ORDER — CLOPIDOGREL BISULFATE 75 MG PO TABS
75.0000 mg | ORAL_TABLET | Freq: Every day | ORAL | Status: DC
  Administered 2017-09-11 – 2017-09-13 (×3): 75 mg via ORAL
  Filled 2017-09-10 (×3): qty 1

## 2017-09-10 MED ORDER — LIDOCAINE HCL 2 % IJ SOLN
INTRAMUSCULAR | Status: AC
  Filled 2017-09-10: qty 10

## 2017-09-10 MED ORDER — SODIUM CHLORIDE 0.9 % IV SOLN: 500.0000 mL | INTRAVENOUS | Status: DC

## 2017-09-10 MED ORDER — HYDRALAZINE HCL 20 MG/ML IJ SOLN: 5.0000 mg | INTRAMUSCULAR | Status: DC | PRN

## 2017-09-10 MED ORDER — GABAPENTIN 600 MG PO TABS
600.0000 mg | ORAL_TABLET | Freq: Three times a day (TID) | ORAL | Status: DC
  Administered 2017-09-10 – 2017-09-13 (×8): 600 mg via ORAL
  Filled 2017-09-10 (×9): qty 1

## 2017-09-10 MED ORDER — LIDOCAINE HCL 2 % IJ SOLN
INTRAMUSCULAR | Status: DC | PRN
  Administered 2017-09-10: 20 mL
  Administered 2017-09-10: 10 mL

## 2017-09-10 MED ORDER — CLOPIDOGREL BISULFATE 300 MG PO TABS
ORAL_TABLET | ORAL | Status: AC
  Filled 2017-09-10: qty 1

## 2017-09-10 MED ORDER — LABETALOL HCL 5 MG/ML IV SOLN
10.0000 mg | INTRAVENOUS | Status: DC | PRN
  Filled 2017-09-10: qty 4

## 2017-09-10 MED ORDER — INSULIN ASPART 100 UNIT/ML ~~LOC~~ SOLN
0.0000 [IU] | Freq: Three times a day (TID) | SUBCUTANEOUS | Status: DC
  Administered 2017-09-11: 17:00:00 3 [IU] via SUBCUTANEOUS
  Administered 2017-09-12 – 2017-09-13 (×3): 5 [IU] via SUBCUTANEOUS

## 2017-09-10 MED ORDER — HEPARIN SODIUM (PORCINE) 1000 UNIT/ML IJ SOLN
INTRAMUSCULAR | Status: DC | PRN
  Administered 2017-09-10: 8000 [IU] via INTRAVENOUS
  Administered 2017-09-10 (×2): 2500 [IU] via INTRAVENOUS

## 2017-09-10 MED ORDER — VERAPAMIL HCL 2.5 MG/ML IV SOLN
INTRAVENOUS | Status: AC
  Filled 2017-09-10: qty 2

## 2017-09-10 MED ORDER — ACETAMINOPHEN 325 MG PO TABS: 650.0000 mg | ORAL_TABLET | ORAL | Status: DC | PRN

## 2017-09-10 MED ORDER — NITROGLYCERIN 1 MG/10 ML FOR IR/CATH LAB
INTRA_ARTERIAL | Status: DC | PRN
  Administered 2017-09-10 (×3): 200 ug via INTRA_ARTERIAL

## 2017-09-10 MED ORDER — LOSARTAN POTASSIUM 50 MG PO TABS
50.0000 mg | ORAL_TABLET | Freq: Every day | ORAL | Status: DC
Start: 2017-09-11 — End: 2017-09-13
  Administered 2017-09-11 – 2017-09-13 (×3): 50 mg via ORAL
  Filled 2017-09-10 (×3): qty 1

## 2017-09-10 MED ORDER — HEPARIN (PORCINE) IN NACL 2-0.9 UNIT/ML-% IJ SOLN
INTRAMUSCULAR | Status: AC
  Filled 2017-09-10: qty 1000

## 2017-09-10 MED ORDER — MULTIVITAMIN ADULT PO CHEW: 1.0000 | CHEWABLE_TABLET | Freq: Every day | ORAL | Status: DC

## 2017-09-10 SURGICAL SUPPLY — 25 items
BALLN COYOTE OTW 2.5X220X150 (BALLOONS) ×3
BALLOON COYOTE OTW 2.5X220X150 (BALLOONS) IMPLANT
CATH ANGIO 5F PIGTAIL 65CM (CATHETERS) ×1 IMPLANT
CATH CROSS OVER TEMPO 5F (CATHETERS) ×1 IMPLANT
CATH CXI SUPP ST 2.6FR 150CM (CATHETERS) ×1 IMPLANT
CATH SOFT-VU ST 4F 90CM (CATHETERS) ×1 IMPLANT
CROWN STEALTH MICRO-30 1.25MM (CATHETERS) ×1 IMPLANT
GUIDEWIRE ANGLED .035X150CM (WIRE) ×1 IMPLANT
GUIDEWIRE ASTATO XS 20G 300CM (WIRE) ×1 IMPLANT
GUIDEWIRE REGALIA .014X300CM (WIRE) ×1 IMPLANT
KIT ENCORE 26 ADVANTAGE (KITS) ×1 IMPLANT
KIT PV (KITS) ×3 IMPLANT
LUBRICANT VIPERSLIDE CORONARY (MISCELLANEOUS) ×1 IMPLANT
SHEATH PINNACLE 5F 10CM (SHEATH) ×1 IMPLANT
SHEATH PINNACLE 6F 10CM (SHEATH) ×1 IMPLANT
SHEATH PINNACLE ST 6F 65CM (SHEATH) ×1 IMPLANT
STOPCOCK MORSE 400PSI 3WAY (MISCELLANEOUS) ×1 IMPLANT
SYRINGE MEDRAD AVANTA MACH 7 (SYRINGE) ×1 IMPLANT
TAPE RADIOPAQUE TURBO (MISCELLANEOUS) ×1 IMPLANT
TRANSDUCER W/STOPCOCK (MISCELLANEOUS) ×3 IMPLANT
TRAY PV CATH (CUSTOM PROCEDURE TRAY) ×3 IMPLANT
TUBING CIL FLEX 10 FLL-RA (TUBING) ×1 IMPLANT
WIRE HITORQ VERSACORE ST 145CM (WIRE) ×2 IMPLANT
WIRE ROSEN-J .035X260CM (WIRE) ×1 IMPLANT
WIRE VIPER WIRECTO 0.014 (WIRE) ×2 IMPLANT

## 2017-09-10 NOTE — Care Management Note (Addendum)
Case Management Note  Patient Details  Name: Katherine [name] MRN: [id number] Date of Birth: 1956/01/17  Subjective/Objective:   From home, s/p pv intervention, will be on plavix, patient has no insurance, has had follow up at [facility], NCM scheduled a follow up for patient for 10/30 at 3 pm at [facility] clinic.    10/23 [place], BSN - patient is for dc today, she will go to [facility] clinic to get medications, she will be able to get plavix and xarelto today per pharmacist,  No other  needs.                   Action/Plan: NCM will follow for dc needs.   Expected Discharge Date:  09/11/17               Expected Discharge Plan:  Home/Self Care  In-House Referral:     Discharge planning Services  CM Consult  Post Acute Care Choice:    Choice offered to:     DME Arranged:    DME Agency:     HH Arranged:    HH Agency:     Status of Service:  Completed, signed off  If discussed at [employer] of Stay Meetings, dates discussed:    Additional Comments:  Katherine Mayo, RN 09/10/2017, 2:44 PM

## 2017-09-10 NOTE — H&P (View-Only) (Signed)
09/05/2017 Katherine [name]   15-Apr-1956  [id number]  Primary Physician Katherine Pier, [name] Primary Cardiologist: Katherine [name] Katherine [name]  HPI:  Katherine [name] is a 61 y.o. 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 She was referred to me by Katherine [name] for peripheral vascular evaluation because of critical limb ischemia. She does have a history of treated hypertension, diabetes and hyperlipidemia. She has never had a heart attack or stroke. She does have a gangrenous left great toe is an ulcer there for several months having undergone several rounds of antibiotic therapy. Her left toe is painful. She had recent venous Doppler study that did not show evidence of DVT.   Current Meds  Medication Sig  . amLODipine (NORVASC) 10 MG tablet Take 1 tablet (10 mg total) by mouth daily.  Marland Kitchen aspirin (ASPIRIN CHILDRENS) 81 MG chewable tablet Chew 1 tablet (81 mg total) by mouth daily.  . Blood Glucose Monitoring Suppl (TRUE METRIX METER) w/Device KIT Use as directed  . docusate sodium (COLACE) 100 MG capsule Take 1 capsule (100 mg total) by mouth daily.  Marland Kitchen gabapentin (NEURONTIN) 300 MG capsule Take 2 capsules (600 mg total) by mouth 3 (three) times daily.  Marland Kitchen glucose blood (TRUE METRIX BLOOD GLUCOSE TEST) test strip Use as instructed  . hydrALAZINE (APRESOLINE) 100 MG tablet Take 1 tablet (100 mg total) by mouth 3 (three) times daily.  . hydrochlorothiazide (HYDRODIURIL) 25 MG tablet Take 1 tablet (25 mg total) by mouth daily.  Marland Kitchen HYDROcodone-acetaminophen (NORCO/VICODIN) 5-325 MG tablet Take 1 tablet by mouth every 6 (six) hours as needed for moderate pain.  . Insulin Glargine (LANTUS SOLOSTAR) 100 UNIT/ML Solostar Pen Inject 180 Units into the skin every morning. And pen needles 1/day.  . Insulin Pen Needle (ULTICARE MICRO PEN NEEDLES) 32G X 4 MM MISC Use as directed  . losartan (COZAAR) 50 MG tablet Take 1 tablet (50 mg  total) by mouth daily.  . meclizine (ANTIVERT) 12.5 MG tablet Take 1 tablet (12.5 mg total) by mouth 3 (three) times daily as needed for dizziness.  . ondansetron (ZOFRAN) 4 MG tablet Take 1 tablet (4 mg total) by mouth every 8 (eight) hours as needed for nausea or vomiting.  Marland Kitchen OVER THE COUNTER MEDICATION Take 1 tablet by mouth daily. Over the counter supplement with iron and other ingredients  . potassium chloride (K-DUR) 10 MEQ tablet Take 1 tablet (10 mEq total) by mouth daily.  . pravastatin (PRAVACHOL) 20 MG tablet Take 1 tablet (20 mg total) by mouth daily.  . Pyridoxine HCl (VITAMIN B-6 PO) Take 1 tablet by mouth daily.  . rivaroxaban (XARELTO) 10 MG TABS tablet Take 1 tablet (10 mg total) by mouth daily. Hold off on taking Aspirin while on this medication  . traZODone (DESYREL) 50 MG tablet Take 0.5-1 tablets (25-50 mg total) by mouth at bedtime as needed for sleep.  . TRUEPLUS LANCETS 28G MISC Use as directed   Current Facility-Administered Medications for the 09/05/17 encounter (Office Visit) with Katherine Harp, [name]  Medication  . 0.9 %  sodium chloride infusion     Allergies  Allergen Reactions  . Other Swelling    Seaweed= swelling on arms, hands and face  . Metformin And Related Nausea Only    Social History   Social History  . Marital status: Married    Spouse name: N/A  . Number of children:  N/A  . Years of education: N/A   Occupational History  . Not on file.   Social History Main Topics  . Smoking status: Never Smoker  . Smokeless tobacco: Never Used  . Alcohol use No  . Drug use: No  . Sexual activity: Not on file   Other Topics Concern  . Not on file   Social History Narrative  . No narrative on file     Review of Systems: General: negative for chills, fever, night sweats or weight changes.  Cardiovascular: negative for chest pain, dyspnea on exertion, edema, orthopnea, palpitations, paroxysmal nocturnal dyspnea or shortness of  breath Dermatological: negative for rash Respiratory: negative for cough or wheezing Urologic: negative for hematuria Abdominal: negative for nausea, vomiting, diarrhea, bright red blood per rectum, melena, or hematemesis Neurologic: negative for visual changes, syncope, or dizziness All other systems reviewed and are otherwise negative except as noted above.    Blood pressure (!) 199/88, pulse 93, height 6' (1.829 m), weight 215 lb 12.8 oz (97.9 kg).  General appearance: alert and no distress Neck: no adenopathy, no JVD, supple, symmetrical, trachea midline, thyroid not enlarged, symmetric, no tenderness/mass/nodules and Bilateral carotid bruits Lungs: clear to auscultation bilaterally Heart: regular rate and rhythm, S1, S2 normal, no murmur, click, rub or gallop Extremities: Edematous left foot with gangrenous left great toe Pulses: 2+ right pedal pulses, absent left pedal pulses. Skin: 2+ right pedal pulses, absent left pedal pulses Neurologic: Alert and oriented X 3, normal strength and tone. Normal symmetric reflexes. Normal coordination and gait  EKG not performed today.  ASSESSMENT AND PLAN:   HYPERTENSION, BENIGN ESSENTIAL History of essential hypertension with blood pressure measured 199/88. She is on amlodipine, hydralazine and losartan. Continue current meds at current dosing. I suspect that her blood pressures elevated because of pain and concern about her left leg.  Dyslipidemia History of hyperlipidemia on statin therapy followed by her PCP  Ulcer of left foot (HCC) Katherine [name]  referred to me by Dr. Evans for evaluation of critical limb ischemia. She's had a left great toe ulcer for several months and was last seen by Dr. Evans in the office on 08/08/17. She has undergone 2 rounds of antibiotic therapy. She denied claudication prior to this. She's been diabetic for last 20 years. She has a good pulse on the right, absent left pedal pulses. I'm going to get lower extremity  arterial Doppler studies on the left. We'll function is acceptable. We'll arrange for her to undergo angiography on Monday of next week.      Katherine J. Berry [name] FACP,FACC,FAHA, FSCAI 09/05/2017 10:52 AM 

## 2017-09-10 NOTE — Progress Notes (Signed)
Site area: right groin  Site Prior to Removal:  Level 0 (oozing site )  Pressure Applied For 20 MINUTES    Minutes Beginning at 1510  Manual:   Yes.    Patient Status During Pull:  AAO X 4  Post Pull Groin Site:  Level 0  Post Pull Instructions Given:  Yes.    Post Pull Pulses Present:  Yes.    Dressing Applied:  Yes.    Comments:  TOLERATED PROCEDURE WELL

## 2017-09-10 NOTE — Interval H&P Note (Signed)
History and Physical Interval Note:  09/10/2017 7:42 AM  Katherine [name]  has presented today for surgery, with the diagnosis of critical lower limb ischemia  The various methods of treatment have been discussed with the patient and family. After consideration of risks, benefits and other options for treatment, the patient has consented to  Procedure(s): LOWER EXTREMITY INTERVENTION (N/A) as a surgical intervention .  The patient's history has been reviewed, patient examined, no change in status, stable for surgery.  I have reviewed the patient's chart and labs.  Questions were answered to the patient's satisfaction.     Quay Burow

## 2017-09-11 DIAGNOSIS — I998 Other disorder of circulatory system: Secondary | ICD-10-CM

## 2017-09-11 DIAGNOSIS — E118 Type 2 diabetes mellitus with unspecified complications: Secondary | ICD-10-CM

## 2017-09-11 DIAGNOSIS — E785 Hyperlipidemia, unspecified: Secondary | ICD-10-CM

## 2017-09-11 DIAGNOSIS — L97523 Non-pressure chronic ulcer of other part of left foot with necrosis of muscle: Secondary | ICD-10-CM

## 2017-09-11 DIAGNOSIS — I10 Essential (primary) hypertension: Secondary | ICD-10-CM

## 2017-09-11 DIAGNOSIS — E1165 Type 2 diabetes mellitus with hyperglycemia: Secondary | ICD-10-CM

## 2017-09-11 DIAGNOSIS — D509 Iron deficiency anemia, unspecified: Secondary | ICD-10-CM

## 2017-09-11 LAB — CBC
HEMATOCRIT: 26.8 % — AB (ref 36.0–46.0)
HEMATOCRIT: 26.7 % — AB (ref 36.0–46.0)
HEMOGLOBIN: 8.3 g/dL — AB (ref 12.0–15.0)
Hemoglobin: 8.2 g/dL — ABNORMAL LOW (ref 12.0–15.0)
MCH: 26.2 pg (ref 26.0–34.0)
MCH: 26.7 pg (ref 26.0–34.0)
MCHC: 30.6 g/dL (ref 30.0–36.0)
MCHC: 31.1 g/dL (ref 30.0–36.0)
MCV: 85.6 fL (ref 78.0–100.0)
MCV: 85.9 fL (ref 78.0–100.0)
PLATELETS: 330 10*3/uL (ref 150–400)
Platelets: 259 10*3/uL (ref 150–400)
RBC: 3.13 MIL/uL — ABNORMAL LOW (ref 3.87–5.11)
RBC: 3.11 MIL/uL — ABNORMAL LOW (ref 3.87–5.11)
RDW: 14.1 % (ref 11.5–15.5)
RDW: 14.5 % (ref 11.5–15.5)
WBC: 6.8 10*3/uL (ref 4.0–10.5)
WBC: 7.6 10*3/uL (ref 4.0–10.5)

## 2017-09-11 LAB — FOLATE: FOLATE: 13.1 ng/mL (ref >5.9)

## 2017-09-11 LAB — VITAMIN B12: VITAMIN B 12: 190 pg/mL (ref 180–914)

## 2017-09-11 LAB — BASIC METABOLIC PANEL
Anion gap: 7 (ref 5–15)
BUN: 16 mg/dL (ref 6–20)
CALCIUM: 8.5 mg/dL — AB (ref 8.9–10.3)
CO2: 29 mmol/L (ref 22–32)
Chloride: 101 mmol/L (ref 101–111)
Creatinine, Ser: 0.94 mg/dL (ref 0.44–1.00)
GFR calc Af Amer: >60 mL/min (ref >60)
Glucose, Bld: 65 mg/dL (ref 65–99)
POTASSIUM: 3.4 mmol/L — AB (ref 3.5–5.1)
SODIUM: 137 mmol/L (ref 135–145)

## 2017-09-11 LAB — GLUCOSE, CAPILLARY
GLUCOSE-CAPILLARY: 55 mg/dL — AB (ref 65–99)
GLUCOSE-CAPILLARY: 191 mg/dL — AB (ref 65–99)
Glucose-Capillary: 93 mg/dL (ref 65–99)
Glucose-Capillary: 118 mg/dL — ABNORMAL HIGH (ref 65–99)
Glucose-Capillary: 298 mg/dL — ABNORMAL HIGH (ref 65–99)

## 2017-09-11 LAB — IRON AND TIBC
IRON: 20 ug/dL — AB (ref 28–170)
SATURATION RATIOS: 13 % (ref 10.4–31.8)
TIBC: 160 ug/dL — ABNORMAL LOW (ref 250–450)
UIBC: 140 ug/dL

## 2017-09-11 LAB — POCT ACTIVATED CLOTTING TIME
ACTIVATED CLOTTING TIME: 180 s
ACTIVATED CLOTTING TIME: 169 s
Activated Clotting Time: 180 seconds

## 2017-09-11 LAB — RETICULOCYTES
RBC.: 3.11 MIL/uL — ABNORMAL LOW (ref 3.87–5.11)
RETIC COUNT ABSOLUTE: 46.7 10*3/uL (ref 19.0–186.0)
Retic Ct Pct: 1.5 % (ref 0.4–3.1)

## 2017-09-11 LAB — FERRITIN: Ferritin: 219 ng/mL (ref 11–307)

## 2017-09-11 MED ORDER — INSULIN GLARGINE 100 UNIT/ML ~~LOC~~ SOLN
180.0000 [IU] | Freq: Every day | SUBCUTANEOUS | Status: DC
  Filled 2017-09-11: qty 1.8

## 2017-09-11 MED ORDER — FERROUS SULFATE 325 (65 FE) MG PO TABS
325.0000 mg | ORAL_TABLET | Freq: Two times a day (BID) | ORAL | Status: DC
  Administered 2017-09-11 – 2017-09-13 (×4): 325 mg via ORAL
  Filled 2017-09-11 (×4): qty 1

## 2017-09-11 NOTE — Progress Notes (Signed)
Progress Note  Patient Name: Katherine [name] Date of Encounter: 09/11/2017  Primary Cardiologist: Very  Subjective   Felt little bit weak - thought her sugars were low. Was able to ambulate okay. No groin pain.  Inpatient Medications    Scheduled Meds: . amLODipine  10 mg Oral Daily  . aspirin EC  81 mg Oral Daily  . atorvastatin  80 mg Oral q1800  . clopidogrel  75 mg Oral Q breakfast  . docusate sodium  100 mg Oral Daily  . ferrous sulfate  325 mg Oral BID WC  . gabapentin  600 mg Oral TID  . hydrALAZINE  100 mg Oral TID  . hydrochlorothiazide  25 mg Oral Daily  . insulin aspart  0-15 Units Subcutaneous TID WC  . losartan  50 mg Oral Daily  . multivitamin with minerals  1 tablet Oral Daily  . potassium chloride  10 mEq Oral Daily  . sodium chloride flush  3 mL Intravenous Q12H   Continuous Infusions: . sodium chloride    . sodium chloride     PRN Meds: sodium chloride, acetaminophen, hydrALAZINE, HYDROcodone-acetaminophen, labetalol, meclizine, ondansetron (ZOFRAN) IV, sodium chloride flush, traZODone   Vital Signs    Vitals:   09/11/17 1148 09/11/17 1545 09/11/17 1813 09/11/17 1949  BP: (!) 139/44 (!) 136/44 (!) 118/44 (!) 117/38  Pulse: 86 86 85 87  Resp: 20 17 18 18   Temp: 98.8 F (37.1 C) 98.4 F (36.9 C) 98 F (36.7 C) 99 F (37.2 C)  TempSrc: Oral Oral Oral Oral  SpO2: 97% 95% 98% 98%  Weight:      Height:        Intake/Output Summary (Last 24 hours) at 09/11/17 2013 Last data filed at 09/11/17 1820  Gross per 24 hour  Intake             1320 ml  Output              650 ml  Net              670 ml   Filed Weights   09/10/17 0539  Weight: 219 lb (99.3 kg)    Telemetry    Sinus rhythm- Personally Reviewed  ECG    Not performed - Personally Reviewed  Physical Exam   GEN: No acute distress.  Yet somewhat lethargic and tired appearing. Neck: No JVD. Soft bilateral carotid bruit Cardiac: RRR, normal S1-S2. no murmurs, rubs, or  gallops.  Respiratory: Clear to auscultation bilaterally. Nonlabored, good air movement GI: Soft, nontender, non-distended; normal active bowel sounds MS: No edema; No deformity. Left foot is edematous and has dressings covering gangrenous toe. There is a palpable pulse in the dorsalis pedis (bilaterally palpable. Neuro:  Nonfocal  Psych: Normal affect   Labs    Chemistry  Recent Labs Lab 09/05/17 1139 09/11/17 0550  NA 139 137  K 4.1 3.4*  CL 97 101  CO2 28 29  GLUCOSE 137* 65  BUN 13 16  CREATININE 0.79 0.94  CALCIUM 9.1 8.5*  GFRNONAA 81 >60  GFRAA 93 >60  ANIONGAP  --  7     Hematology  Recent Labs Lab 09/05/17 1139 09/11/17 0550 09/11/17 1147  WBC 7.1 6.8 7.6  RBC 3.84 3.13* 3.11*  3.11*  HGB 10.4* 8.2* 8.3*  HCT 31.9* 26.8* 26.7*  MCV 83 85.6 85.9  MCH 27.1 26.2 26.7  MCHC 32.6 30.6 31.1  RDW 13.7 14.1 14.5  PLT 365 330 259  Cardiac EnzymesNo results for input(s): TROPONINI in the last 168 hours. No results for input(s): TROPIPOC in the last 168 hours.   BNPNo results for input(s): BNP, PROBNP in the last 168 hours.   DDimer No results for input(s): DDIMER in the last 168 hours.   Radiology    No results found.  Cardiac Studies   PV Angiogram/Intervention - relatively normal distal abdominal aorta. Left anterior tibial and posterior tibial arteries occluded. Peroneal artery with patent to ankle. Right posterior tibial artery occluded.  diamondback orbital atherectomy of left anterior tibial followed by PTA.  Patient Profile     61 y.o. female with significant PAD and left foot gangrene brought in for peripheral vascular intervention by Dr. Gwenlyn Found on 09/10/2017.  Assessment & Plan    Principal Problem:   Critical lower limb ischemia Active Problems:   Diabetes mellitus type 2 with complications, uncontrolled (HCC)   HYPERTENSION, BENIGN ESSENTIAL   Dyslipidemia   Ulcer of left foot (HCC)   Microcytic anemia  Critical limb  ischemia/PAD: Status post atherectomy and angioplasty of the left anterior tibial artery was totally occluded. Improved blood flow to the left foot.  Discharge delayed by anemia.Marland Kitchen -- Status post CTO atherectomy and angioplasty of left anterior tibial artery for limb salvage.  Aspirin and Plavix statin and Norvasc.  Microcytic anemia is concerning - notable drop in hemoglobin without any obvious source of bleeding. There is nothing on groin exam to suggest RPDA bleed or hematoma. Not hemodynamically showing signs of rapid bleed. We rechecked her hemoglobin later on and it was stable. Show signs of microcytic anemia.   We will start her on oral iron  Will need follow-up CBC as an outpatient.  Will also need more detailed evaluation if numbers continue to drop.  Blood pressure stable on Norvasc, hydralazine and HCTZ along with losartan.  Dyslipidemia with goal LDL less than 70: On high-dose atorvastatin.  Diabetes mellitus, 2: Complicated by PAD etc.  - On Lantus plus sliding scale. She feels like her Lantus dose is too high because she has had according to her multiple readings hypoglycemia -> however she says she feels bad after her glucoses go below 200. Currently this morning was 50. - I do agree that her dose could be decreased on discharge.   We'll monitor her overnight for signs of worsening anemia. If she is stable can likely be discharged tomorrow.    For questions or updates, please contact [place] Please consult [email or web address] for contact info under Cardiology/STEMI.      Signed, Glenetta Hew, MD  09/11/2017, 8:13 PM

## 2017-09-11 NOTE — Progress Notes (Signed)
    Pt. Seen & examined this AM - a bit groggy & weak, ? Related to low CBG. Upon review of post-procedure labs, Hgb level dropped 2 points to 8.2 from ~10. - more than expected. - groin access site, C/D/I - no hematoma, non-tender --> nothing to suggest access bleed.  Will recheck CBC with anemia panel @ ~1100.  - may need to d/c with FeSO4 & ensure recheck.  Glenetta Hew, MD

## 2017-09-11 NOTE — Progress Notes (Signed)
At 0630 cbg was 55mg /dl. Given apple juice 4oz, and peanut butter and crackers.  Reports she does feel like her sugar is low, and states she is nervous about taking so much lantus, states she told her physician and they will adjust it in a few weeks.

## 2017-09-12 ENCOUNTER — Encounter: Payer: Self-pay | Admitting: Internal Medicine

## 2017-09-12 ENCOUNTER — Ambulatory Visit (HOSPITAL_COMMUNITY): Payer: Self-pay

## 2017-09-12 DIAGNOSIS — D62 Acute posthemorrhagic anemia: Secondary | ICD-10-CM

## 2017-09-12 LAB — GLUCOSE, CAPILLARY
GLUCOSE-CAPILLARY: 162 mg/dL — AB (ref 65–99)
GLUCOSE-CAPILLARY: 200 mg/dL — AB (ref 65–99)
Glucose-Capillary: 244 mg/dL — ABNORMAL HIGH (ref 65–99)
Glucose-Capillary: 268 mg/dL — ABNORMAL HIGH (ref 65–99)

## 2017-09-12 LAB — CBC
HEMATOCRIT: 25 % — AB (ref 36.0–46.0)
HEMOGLOBIN: 7.6 g/dL — AB (ref 12.0–15.0)
MCH: 25.9 pg — ABNORMAL LOW (ref 26.0–34.0)
MCHC: 30.4 g/dL (ref 30.0–36.0)
MCV: 85.3 fL (ref 78.0–100.0)
Platelets: 300 10*3/uL (ref 150–400)
RBC: 2.93 MIL/uL — AB (ref 3.87–5.11)
RDW: 14.2 % (ref 11.5–15.5)
WBC: 7.5 10*3/uL (ref 4.0–10.5)

## 2017-09-12 LAB — HEMOGLOBIN AND HEMATOCRIT, BLOOD
HEMATOCRIT: 28.4 % — AB (ref 36.0–46.0)
Hemoglobin: 8.7 g/dL — ABNORMAL LOW (ref 12.0–15.0)

## 2017-09-12 LAB — ABO/RH: ABO/RH(D): O POS

## 2017-09-12 LAB — PREPARE RBC (CROSSMATCH)

## 2017-09-12 MED ORDER — SODIUM CHLORIDE 0.9 % IV SOLN: Freq: Once | INTRAVENOUS | Status: DC

## 2017-09-12 NOTE — Progress Notes (Signed)
Pt refusing to let anyone assess foot.

## 2017-09-12 NOTE — Progress Notes (Signed)
Progress Note  Patient Name: Katherine [name] Date of Encounter: 09/12/2017  Primary Cardiologist: Gwenlyn Found   Subjective   Reports mild pain in the right groin at fem stick site. Otherwise no pain.   Inpatient Medications    Scheduled Meds: . amLODipine  10 mg Oral Daily  . aspirin EC  81 mg Oral Daily  . atorvastatin  80 mg Oral q1800  . clopidogrel  75 mg Oral Q breakfast  . docusate sodium  100 mg Oral Daily  . ferrous sulfate  325 mg Oral BID WC  . gabapentin  600 mg Oral TID  . hydrALAZINE  100 mg Oral TID  . hydrochlorothiazide  25 mg Oral Daily  . insulin aspart  0-15 Units Subcutaneous TID WC  . losartan  50 mg Oral Daily  . multivitamin with minerals  1 tablet Oral Daily  . potassium chloride  10 mEq Oral Daily  . sodium chloride flush  3 mL Intravenous Q12H   Continuous Infusions: . sodium chloride    . sodium chloride     PRN Meds: sodium chloride, acetaminophen, hydrALAZINE, HYDROcodone-acetaminophen, labetalol, meclizine, ondansetron (ZOFRAN) IV, sodium chloride flush, traZODone   Vital Signs    Vitals:   09/11/17 1813 09/11/17 1949 09/11/17 2154 09/12/17 0500  BP: (!) 118/44 (!) 117/38 (!) 151/56 (!) 112/39  Pulse: 85 87  71  Resp: 18 18  18   Temp: 98 F (36.7 C) 99 F (37.2 C)  97.7 F (36.5 C)  TempSrc: Oral Oral  Oral  SpO2: 98% 98%  98%  Weight:    216 lb 3.2 oz (98.1 kg)  Height:        Intake/Output Summary (Last 24 hours) at 09/12/17 0746 Last data filed at 09/11/17 1820  Gross per 24 hour  Intake              720 ml  Output              650 ml  Net               70 ml   Filed Weights   09/10/17 0539 09/12/17 0500  Weight: 219 lb (99.3 kg) 216 lb 3.2 oz (98.1 kg)    Telemetry    SR - Personally Reviewed  Physical Exam   General: Well developed, well nourished, AA female appearing in no acute distress. Head: Normocephalic, atraumatic.  Neck: Supple without bruits, JVD. Lungs:  Resp regular and unlabored, CTA. Heart: RRR,  S1, S2, no S3, S4, or murmur; no rub. Abdomen: Soft, non-distended with normoactive bowel sounds. Some tenderness in the lower abd/groin at cath site. Extremities: No clubbing, cyanosis, edema. No swelling noted to thighs (would not let me examine lower legs or feet).  Neuro: Alert and oriented X 3. Moves all extremities spontaneously. Psych: Normal affect.  Labs    Chemistry Recent Labs Lab 09/05/17 1139 09/11/17 0550  NA 139 137  K 4.1 3.4*  CL 97 101  CO2 28 29  GLUCOSE 137* 65  BUN 13 16  CREATININE 0.79 0.94  CALCIUM 9.1 8.5*  GFRNONAA 81 >60  GFRAA 93 >60  ANIONGAP  --  7     Hematology Recent Labs Lab 09/11/17 0550 09/11/17 1147 09/12/17 0526  WBC 6.8 7.6 7.5  RBC 3.13* 3.11*  3.11* 2.93*  HGB 8.2* 8.3* 7.6*  HCT 26.8* 26.7* 25.0*  MCV 85.6 85.9 85.3  MCH 26.2 26.7 25.9*  MCHC 30.6 31.1 30.4  RDW 14.1 14.5  14.2  PLT 330 259 300    Cardiac EnzymesNo results for input(s): TROPONINI in the last 168 hours. No results for input(s): TROPIPOC in the last 168 hours.   BNPNo results for input(s): BNP, PROBNP in the last 168 hours.   DDimer No results for input(s): DDIMER in the last 168 hours.    Radiology    No results found.  Cardiac Studies   09/10/17  PV Angiogram/Intervention - relatively normal distal abdominal aorta. Left anterior tibial and posterior tibial arteries occluded. Peroneal artery with patent to ankle. Right posterior tibial artery occluded.  diamondback orbital atherectomy of left anterior tibial followed by PTA.  Patient Profile     61 y.o. female with PMH of PAD, and left foot gangrene who presented for outpatient PV angiogram with Dr. Gwenlyn Found. Notable drop in Hgb and observed overnight, further drop this am.   Assessment & Plan    1. PAD/Left foot gangrene: s/p atherectomy and angioplasty of the left AT artery with improved blood flow to the foot. Left foot with bandage noted, but patient would not allow me to examine her foot  this morning.  -- remains on ASA, plavix, and statin  2. Microcytic Anemia: Hgb dropped to 8.2 post procedure. Recheck yesterday afternoon was 8.3. Iron studied showed low Fe. Added Iron yesterday. Recheck of Hgb this am 7.6. No obvious signs of hematoma or RP bleed but concerning given further drop.  -- CT abd.pelvis -- transfuse 1 unit -- recheck CBC post transfusion -- check stools  3. HTN: Stable with current regimen  4. HL: on statin  5. IDDM: CBGs were low yesterday morning. Lantus was dc'ed. Improved today.  -- continue SSI   Signed, Reino Bellis, NP  09/12/2017, 7:46 AM  Pager # [phone]    I have seen, examined and evaluated the patient this PM along with Reino Bellis, NP-C.  After reviewing all the available data and chart, we discussed the patients laboratory, study & physical findings as well as symptoms in detail. I agree with her findings, examination as well as impression recommendations as per our discussion.    Attending adjustments noted in italics.   Very interesting that her hemoglobin continued to drop. No signs on physical exam the groin at the access site or on the contralateral leg where the intervention was done. She is tender there but is not showing signs at all of compartment syndrome or significant perforation related bleed. CT scanning showed a relatively small hematoma in the groin but does not explain 2 unit blood loss.  At this point I think with microcytic anemia we need to consider that this is potentially GI related. She had multiple episodes of GERD and over the last 2 weeks has been taking quite a bit of Tums and Zantac. Also she has yet to have a bowel movement here so we have not been able to check her guaiac. Her hemoglobin did drop from August to October 2 units from 12.3-10.4.  - We consulted GI medicine  Unfortunately because her anemia we have not yet walked her to see how she is doing claudication standpoint. She has a warm left foot  with palpable pulse.  Plan for now is to give her 1 unit of blood. I stopped aspirin. I'm not sure what the thought process is potentially adding back Xarelto was, but at this point I would not do it. We will Plavix for her recent PTA. We reduced her Lantus dose and stopped it finally for low  blood sugars need to be addressed as an outpatient.  At this point I would like to see her have a stable posttransfusion hemoglobin this afternoon and then tomorrow morning.  If it is stable, we can likely let her be discharged.   Glenetta Hew, M.D., M.S. Interventional Cardiologist   Pager # [phone] Phone # [phone] [address] [place], [place] [address]    For questions or updates, please contact [place] Please consult [email or web address] for contact info under Cardiology/STEMI. Daytime calls, contact the Day Call APP (6a-8a) or assigned team (Teams A-D) provider (7:30a - 5p). All other daytime calls (7:30-5p), contact the Card Master @ [phone].   Nighttime calls, contact the assigned APP (5p-8p) or MD (6:30p-8p). Overnight calls (8p-6a), contact the on call Fellow @ [phone].

## 2017-09-12 NOTE — Consult Note (Signed)
[facility] Nurse wound consult note Reason for Consult: ischemic left great toe ABI 0.67, recent re-vascularization procedure  Patient will not allow staff to see toe for care. WOC spent approximately 15 minutes discussing options for patient to either allow me and clinical staff to assess and care for the toe or that she continues to provide care on her own without clinical input. Wound type: arterial, dry gangrenous mummified great left toe, some mild darkening of the inner aspect of the 2nd toe and the web space. Darkening around the great toe metatarsal  head.  Pressure Injury POA: NA Measurement: entire great toe Wound bed:100% dry black eschar/gangrene  Drainage (amount, consistency, odor) none Periwound: intact, palpable pulse, warm foot  Dressing procedure/placement/frequency: Continue to paint with betadine BID, patient is soaking in Betadine at home, she will continue this treatment with soaks and "bandaids with betadine" at home.  I have had lengthy conversation with this patient today about the possibility of autoamputation and keeping toe stable.  She is tearful but seems appreciative of our conversation.    Follow up with podiatry or interventional cardiology per their recommendations.   Discussed POC with patient and bedside nurse.  Re consult if needed, will not follow at this time. Thanks  Mical Kicklighter [employer], RN,CWOCN, CNS, [place] [phone])

## 2017-09-12 NOTE — Progress Notes (Signed)
Chart reviewed. Pt currently in hosp with worsened anemia post angiography and vascular procedure LT leg. She had not started Xarelto as yet per our last telephone conversation 09/02/2017. Will monitor hosp progress.

## 2017-09-12 NOTE — Progress Notes (Signed)
Received a call from Lab recollection on BMET.

## 2017-09-13 LAB — CBC
HCT: 28.3 % — ABNORMAL LOW (ref 36.0–46.0)
HEMOGLOBIN: 8.9 g/dL — AB (ref 12.0–15.0)
MCH: 26.9 pg (ref 26.0–34.0)
MCHC: 31.4 g/dL (ref 30.0–36.0)
MCV: 85.5 fL (ref 78.0–100.0)
Platelets: 291 10*3/uL (ref 150–400)
RBC: 3.31 MIL/uL — ABNORMAL LOW (ref 3.87–5.11)
RDW: 14 % (ref 11.5–15.5)
WBC: 8.3 10*3/uL (ref 4.0–10.5)

## 2017-09-13 LAB — TYPE AND SCREEN
ABO/RH(D): O POS
ANTIBODY SCREEN: NEGATIVE
Unit division: 0

## 2017-09-13 LAB — GLUCOSE, CAPILLARY
GLUCOSE-CAPILLARY: 215 mg/dL — AB (ref 65–99)
GLUCOSE-CAPILLARY: 206 mg/dL — AB (ref 65–99)

## 2017-09-13 LAB — BPAM RBC
BLOOD PRODUCT EXPIRATION DATE: 201810262359
ISSUE DATE / TIME: 201810241359
Unit Type and Rh: 9500

## 2017-09-13 MED ORDER — ATORVASTATIN CALCIUM 80 MG PO TABS: 80.0000 mg | ORAL_TABLET | Freq: Every day | ORAL | 0 refills | Status: DC

## 2017-09-13 MED ORDER — FERROUS SULFATE 325 (65 FE) MG PO TABS: 325.0000 mg | ORAL_TABLET | Freq: Two times a day (BID) | ORAL | 0 refills | Status: DC

## 2017-09-13 MED ORDER — CLOPIDOGREL BISULFATE 75 MG PO TABS: 75.0000 mg | ORAL_TABLET | Freq: Every day | ORAL | 0 refills | Status: DC

## 2017-09-13 MED FILL — ATORVASTATIN 80 MG TABLET: 80 | 30 days supply | Qty: 30 | Fill #0

## 2017-09-13 MED FILL — FERROUS SULFATE 325 MG TAB: 325 (65 FE) | 30 days supply | Qty: 60 | Fill #0

## 2017-09-13 MED FILL — ?CLOPIDOGREL 75MG TAB: 75 | 30 days supply | Qty: 30 | Fill #0

## 2017-09-13 NOTE — Discharge Summary (Signed)
Discharge Summary    Patient ID: Katherine [name],  MRN: [id number], DOB/AGE: [birth year] 61 y.o.  Admit date: 09/10/2017 Discharge date: 09/13/2017  Primary Care Provider: Ladell Pier Primary Cardiologist: Gwenlyn Found   Discharge Diagnoses    Principal Problem:   Critical lower limb ischemia Active Problems:   Diabetes mellitus type 2 with complications, uncontrolled ([place])   HYPERTENSION, BENIGN ESSENTIAL   Dyslipidemia   Ulcer of left foot (HCC)   Microcytic anemia   Allergies Allergies  Allergen Reactions  . Other Swelling    Seaweed= swelling on arms, hands and face  . Metformin And Related Nausea Only    Diagnostic Studies/Procedures    PV angiogram: 09/10/17  Procedures Performed:            1. Abdominal aortogram/bilateral iliac angiogram/bifemoral runoff            2. Contralateral access(third order catheter placement)            3. Diamondback orbital rotation arthrectomy left anterior tibial            4. PTA left anterior tibial  Procedure Description:contralateral access was obtained with a crossover catheter and 035 Glidewire. A long 4 [place] ental catheter was then advanced into the SFA and spot pictures were taken of the below the knee tibial vessels. Following this a 03 fibrosis wire was then used to exchange for a 65 cm 6 [place] destination sheath which was then placed in the mid left SFA. The patient received a total of 13,000 units of heparin with an ACT of 241. Total contrast administered to the patient with 215 mL. Fluoroscopy time was prolonged at 35.6 minutes.  I then was able to cross the chronic total occluded anterior tibial with a 018 CXI end hole catheter along with a 0.14 [place]  wire and an 014 Astato 20  Wire. I then placed a 14 Viper wire through the End Hole catheter into the dorsalis pedis vessel. I performed diamond back orbital rotational atherectomy with a 1.25 mm micro-bur throughout the entirety of the occluded anterior tibial.  Artery and performed PTCA using a 2.5 x 220 mm long Abbott over the wire 0.14 [place]  Balloon reducing a total occlusion to 0% residual with excellent flow and no obvious dissection down to the foot.copious amounts of intra-arterial metaplasia were administered during the atherectomy part of the procedure. The wire was then removed and completion angiography performed.  Final Impression: successful diamondback orbital rotational atherectomy, PTA of a occluded anterior tibial artery on the left in the setting of critical limb ischemia with a gangrenous left first great toe. The patient received 300 mg of by mouth Plavix. The long sheath was exchanged over at [place] for a short 6 [place] sheath which was then secured in place. The sheath will be removed and pressure held once the ACT falls below 170. The patient will be hydrated overnight, discharged home in the morning on dual antiplatelet therapy. She will get lower extremity arterial Doppler studies in our [employer] office next week and I will see her back the week after. Dr. Daylene Katayama, her podiatrist, was notified of these results.  Quay Burow. MD, [facility] _____________   History of Present Illness     61 yo female with PMH of hypertension, diabetes, hyperlipidemia and PAD. She is followed by Dr. Alvester Chou as an outpatient and was last seen in the office on 09/05/17 for a gangrenous great left toe with no improvement  after several rounds of antibiotic therapy. She was referred for lower extremity Dopplers which were positive for left lower extremity arterial disease. As a result she was set up for outpatient PV angiogram with Dr. Gwenlyn Found.  Hospital Course     Underwent successful diamondback orbital rotational arthrectomy of occluded anterior tibial artery on the left. She was given 300 of Plavix in the lab, and plan for the DAPT with aspirin/Plavix. Of note she had a prolonged fluoroscopy time of 35.6 minutes, with a total of 215 mL of contrast.  Post-cath reported feeling well, but morning labs showed further decline in hemoglobin from 10-8.2. She was held and repeat hemoglobin later that afternoon showed 8.3, and anemia panel noted low iron capacities. She was started on iron supplement. Labs the following morning showed further decline in hemoglobin to 7.6. Oddly enough her right femoral cath site remained stable without significant bruising, or hematoma. She denied any significant abdominal pain or flank pain. Given her decline in hemoglobin she was sent for a CT abdomen and pelvis which showed ill-defined hemorrhage of the right femoral/inguinal region of 3 x 4 x 5 cm, with no evidence of intra-abdominal or intrapelvic hemorrhage/hematoma. No signs of compartment syndrome noted. She was given 1 unit of PRBCs with improvement in hemoglobin to 8.9. Her 81 mg aspirin was stopped, but Plavix continued. Of note as an outpatient she had previously been prescribed to start Xarelto for a superficial thrombus, but reported she had not started this medication yet. At this time would not recommend starting Xarelto, and encouraged her to follow-up with her PCP regarding further recommendations. Also reported taking several days' worth of Tums prior to admission, but states she had seen in GI earlier this year and had a normal colonoscopy done. Her anemia will need to be followed closely by her PCP as an outpatient. Also had issues with hypoglycemia this admission and her Lantus was reduced and eventually stopped. Instructed to follow this up as an outpatient.   General: Well developed, well nourished, female appearing in no acute distress. Head: Normocephalic, atraumatic.  Neck: Supple without bruits, JVD. Lungs:  Resp regular and unlabored, CTA. Heart: RRR, S1, S2, no S3, S4, or murmur; no rub. Abdomen: Soft, non-tender, non-distended with normoactive bowel sounds. No hepatomegaly. No rebound/guarding. No obvious abdominal masses. Extremities: Left foot  with edema and dry dressing in place. R femoral cath site stable without bruising or hematoma Neuro: Alert and oriented X 3. Moves all extremities spontaneously. Psych: Normal affect.  Lucindia G Wooton was seen by Dr. Ellyn Hack and determined stable for discharge home. Follow up in the office has been arranged. Medications are listed below.   _____________  Discharge Vitals Blood pressure (!) 136/49, pulse 77, temperature 98.5 F (36.9 C), temperature source Oral, resp. rate 20, height 6' (1.829 m), weight 215 lb 2.7 oz (97.6 kg), SpO2 97 %.  Filed Weights   09/10/17 0539 09/12/17 0500 09/13/17 0605  Weight: 219 lb (99.3 kg) 216 lb 3.2 oz (98.1 kg) 215 lb 2.7 oz (97.6 kg)    Labs & Radiologic Studies    CBC  Recent Labs  09/12/17 0526 09/12/17 1701 09/13/17 0657  WBC 7.5  --  8.3  HGB 7.6* 8.7* 8.9*  HCT 25.0* 28.4* 28.3*  MCV 85.3  --  85.5  PLT 300  --  829   Basic Metabolic Panel  Recent Labs  09/11/17 0550  NA 137  K 3.4*  CL 101  CO2 29  GLUCOSE 65  BUN 16  CREATININE 0.94  CALCIUM 8.5*   Liver Function Tests No results for input(s): AST, ALT, ALKPHOS, BILITOT, PROT, ALBUMIN in the last 72 hours. No results for input(s): LIPASE, AMYLASE in the last 72 hours. Cardiac Enzymes No results for input(s): CKTOTAL, CKMB, CKMBINDEX, TROPONINI in the last 72 hours. BNP Invalid input(s): POCBNP D-Dimer No results for input(s): DDIMER in the last 72 hours. Hemoglobin A1C No results for input(s): HGBA1C in the last 72 hours. Fasting Lipid Panel No results for input(s): CHOL, HDL, LDLCALC, TRIG, CHOLHDL, LDLDIRECT in the last 72 hours. Thyroid Function Tests No results for input(s): TSH, T4TOTAL, T3FREE, THYROIDAB in the last 72 hours.  Invalid input(s): FREET3 _____________  Ct Abdomen Pelvis Wo Contrast  Result Date: 09/12/2017 CLINICAL DATA:  61 year old female with right pelvic and groin pain following catheterization. EXAM: CT ABDOMEN AND PELVIS WITHOUT  CONTRAST TECHNIQUE: Multidetector CT imaging of the abdomen and pelvis was performed following the standard protocol without IV contrast. COMPARISON:  12/11/2014 CT FINDINGS: Please note that parenchymal abnormalities may be missed without intravenous contrast. Lower chest: No acute abnormality Hepatobiliary: The liver is unremarkable. Patient is status post cholecystectomy. No biliary dilatation. Pancreas: Unremarkable Spleen: Unremarkable Adrenals/Urinary Tract: The kidneys, adrenal glands and bladder are unremarkable. Stomach/Bowel: Stomach is within normal limits. Appendix appears normal. No evidence of bowel wall thickening, distention, or inflammatory changes. Vascular/Lymphatic: Ill-defined hemorrhage in the right femoral/ inguinal region is noted, measuring up to 3 x 4 x 5 cm in diameter. There is no evidence of intra- abdominal or intrapelvic hemorrhage/hematoma. Aortic atherosclerotic calcifications noted without aneurysm. No enlarged lymph nodes. Reproductive: Uterus and bilateral adnexa are unremarkable. Other: No ascites or pneumoperitoneum identified. Musculoskeletal: No acute or suspicious bony findings. IMPRESSION: 1. Ill-defined hemorrhage in the right femoral/inguinal region measuring 3 x 4 x 5 cm. No evidence of intra- abdominal or intrapelvic hemorrhage/hematoma. 2.  Aortic Atherosclerosis (ICD10-I70.0). Electronically Signed   By: Margarette Canada M.D.   On: 09/12/2017 12:27   Disposition   Pt is being discharged home today in good condition.  Follow-up Plans & Appointments    Follow-up [facility] Follow up on 09/18/2017.   Why:  3 pm for hospital follow up regarding your blood work and diabetes management. Contact information: [place] [id number] [place] [facility] Follow up on 09/24/2017.   Specialty:  Cardiology Why:  at 11am for your follow up dopplers Contact  information: [address] [place] [phone]       Lorretta Harp, MD Follow up on 09/28/2017.   Specialties:  Cardiology, Radiology Why:  at 1:30pm for your follow up appt.  Contact information: [address] [place] [address] [phone]          Discharge Instructions    Call MD for:  redness, tenderness, or signs of infection (pain, swelling, redness, odor or green/yellow discharge around incision site)    Complete by:  As directed    Diet - low sodium heart healthy    Complete by:  As directed    Discharge instructions    Complete by:  As directed    Groin Site Care Refer to this sheet in the next few weeks. These instructions provide you with information on caring for yourself after your procedure. Your caregiver may also give you more specific instructions. Your treatment has been  planned according to current medical practices, but problems sometimes occur. Call your caregiver if you have any problems or questions after your procedure. HOME CARE INSTRUCTIONS You may shower 24 hours after the procedure. Remove the bandage (dressing) and gently wash the site with plain soap and water. Gently pat the site dry.  Do not apply powder or lotion to the site.  Do not sit in a bathtub, swimming pool, or whirlpool for 5 to 7 days.  No bending, squatting, or lifting anything over 10 pounds (4.5 kg) as directed by your caregiver.  Inspect the site at least twice daily.  Do not drive home if you are discharged the same day of the procedure. Have someone else drive you.  You may drive 24 hours after the procedure unless otherwise instructed by your caregiver.  What to expect: Any bruising will usually fade within 1 to 2 weeks.  Blood that collects in the tissue (hematoma) may be painful to the touch. It should usually decrease in size and tenderness within 1 to 2 weeks.  SEEK IMMEDIATE MEDICAL CARE IF: You have unusual  pain at the groin site or down the affected leg.  You have redness, warmth, swelling, or pain at the groin site.  You have drainage (other than a small amount of blood on the dressing).  You have chills.  You have a fever or persistent symptoms for more than 72 hours.  You have a fever and your symptoms suddenly get worse.  Your leg becomes pale, cool, tingly, or numb.  You have heavy bleeding from the site. Hold pressure on the site. .  Please monitor for signs of bleeding with being on Plavix. Also you need to have close follow up with your PCP regarding your hemaglobin, and insulin dosing. Follow up dopplers have been arranged and an appt with Dr. Gwenlyn Found.   Increase activity slowly    Complete by:  As directed       Discharge Medications     Medication List    STOP taking these medications   aspirin 81 MG chewable tablet Commonly known as:  ASPIRIN CHILDRENS   pravastatin 20 MG tablet Commonly known as:  PRAVACHOL   rivaroxaban 10 MG Tabs tablet Commonly known as:  XARELTO     TAKE these medications   amLODipine 10 MG tablet Commonly known as:  NORVASC Take 1 tablet (10 mg total) by mouth daily.   atorvastatin 80 MG tablet Commonly known as:  LIPITOR Take 1 tablet (80 mg total) by mouth daily at 6 PM.   clopidogrel 75 MG tablet Commonly known as:  PLAVIX Take 1 tablet (75 mg total) by mouth daily with breakfast.   docusate sodium 100 MG capsule Commonly known as:  COLACE Take 1 capsule (100 mg total) by mouth daily.   ferrous sulfate 325 (65 FE) MG tablet Take 1 tablet (325 mg total) by mouth 2 (two) times daily with a meal.   gabapentin 300 MG capsule Commonly known as:  NEURONTIN Take 2 capsules (600 mg total) by mouth 3 (three) times daily.   glucose blood test strip Commonly known as:  TRUE METRIX BLOOD GLUCOSE TEST Use as instructed   hydrALAZINE 100 MG tablet Commonly known as:  APRESOLINE Take 1 tablet (100 mg total) by mouth 3 (three) times  daily.   hydrochlorothiazide 25 MG tablet Commonly known as:  HYDRODIURIL Take 1 tablet (25 mg total) by mouth daily.   HYDROcodone-acetaminophen 5-325 MG tablet Commonly known as:  NORCO/VICODIN  Take 1 tablet by mouth every 6 (six) hours as needed for moderate pain.   Insulin Pen Needle 32G X 4 MM Misc Commonly known as:  ULTICARE MICRO PEN NEEDLES Use as directed   LANTUS SOLOSTAR 100 UNIT/ML Solostar Pen Generic drug:  Insulin Glargine Inject 180 Units into the skin daily.   losartan 50 MG tablet Commonly known as:  COZAAR Take 1 tablet (50 mg total) by mouth daily.   meclizine 12.5 MG tablet Commonly known as:  ANTIVERT Take 1 tablet (12.5 mg total) by mouth 3 (three) times daily as needed for dizziness.   MULTIVITAMIN ADULT Chew Chew 1 tablet by mouth daily.   ondansetron 4 MG tablet Commonly known as:  ZOFRAN Take 1 tablet (4 mg total) by mouth every 8 (eight) hours as needed for nausea or vomiting.   potassium chloride 10 MEQ tablet Commonly known as:  K-DUR Take 1 tablet (10 mEq total) by mouth daily.   traZODone 50 MG tablet Commonly known as:  DESYREL Take 0.5-1 tablets (25-50 mg total) by mouth at bedtime as needed for sleep.   TRUE METRIX METER w/Device Kit Use as directed   TRUEPLUS LANCETS 28G Misc Use as directed         Outstanding Labs/Studies   CBC at follow up appt.   Duration of Discharge Encounter   Greater than 30 minutes including physician time.  Signed, Reino Bellis NP-C 09/13/2017, 3:24 PM

## 2017-09-13 NOTE — Progress Notes (Signed)
Pt discharge education provided at bedside with pt and pt family. Pt has all belongings. Pt IV discontinued, catheter intact and telemetry removed. Pt discharged via wheelchair with pt nurse tech

## 2017-09-17 NOTE — Progress Notes (Signed)
Patient ID: Katherine [name], female   DOB: 1956-05-31, 61 y.o.   MRN: [id number]     Katherine [name], is a 61 y.o. female  MBW:[id number]  SVX:[id number]  DOB - 12-11-1955  Subjective:  Chief Complaint and HPI: Katherine [name] is a 61 y.o. female here today to for a follow up visit After being hospitalized 10/22-10/25/2018 for: Principal Problem:   Critical lower limb ischemia Active Problems:   Diabetes mellitus type 2 with complications, uncontrolled ([place])   HYPERTENSION, BENIGN ESSENTIAL   Dyslipidemia   Ulcer of left foot ([place])   Microcytic anemia  She presents today feeling overall better.  She washed the area on her R leg/hip today.  Her blood sugars have ben high and she admits to poor diet.  Dr Loanne Drilling currently has her on 200units lantus daily and she says she is compliant with this.  No f/c.  She is taking her Iron tablets.   Hospital course: Underwent successful diamondback orbital rotational arthrectomy of occluded anterior tibial artery on the left. She was given 300 of Plavix in the lab, and plan for the DAPT with aspirin/Plavix. Of note she had a prolonged fluoroscopy time of 35.6 minutes, with a total of 215 mL of contrast. Post-cath reported feeling well, but morning labs showed further decline in hemoglobin from 10-8.2. She was held and repeat hemoglobin later that afternoon showed 8.3, and anemia panel noted low iron capacities. She was started on iron supplement. Labs the following morning showed further decline in hemoglobin to 7.6. Oddly enough her right femoral cath site remained stable without significant bruising, or hematoma. She denied any significant abdominal pain or flank pain. Given her decline in hemoglobin she was sent for a CT abdomen and pelvis which showed ill-defined hemorrhage of the right femoral/inguinal region of 3 x 4 x 5 cm, with no evidence of intra-abdominal or intrapelvic hemorrhage/hematoma. No signs of compartment syndrome noted. She was given 1 unit of  PRBCs with improvement in hemoglobin to 8.9. Her 81 mg aspirin was stopped, but Plavix continued. Of note as an outpatient she had previously been prescribed to start Xarelto for a superficial thrombus, but reported she had not started this medication yet. At this time would not recommend starting Xarelto, and encouraged her to follow-up with her PCP regarding further recommendations. Also reported taking several days' worth of Tums prior to admission, but states she had seen in GI earlier this year and had a normal colonoscopy done. Her anemia will need to be followed closely by her PCP as an outpatient. Also had issues with hypoglycemia this admission and her Lantus was reduced and eventually stopped. Instructed to follow this up as an outpatient.  ED/Hospital notes reviewed.     ROS:   Constitutional:  No f/c, No night sweats, No unexplained weight loss. EENT:  No vision changes, No blurry vision, No hearing changes. No mouth, throat, or ear problems.  Respiratory: No cough, No SOB Cardiac: No CP, no palpitations GI:  No abd pain, No N/V/D. GU: No Urinary s/sx Musculoskeletal: No joint pain Neuro: No headache, no dizziness, no motor weakness.  Skin: No rash Endocrine:  No polydipsia. No polyuria.  Psych: Denies SI/HI  No problems updated.  ALLERGIES: Allergies  Allergen Reactions  . Other Swelling    Seaweed= swelling on arms, hands and face  . Metformin And Related Nausea Only    PAST MEDICAL HISTORY: Past Medical History:  Diagnosis Date  . Boil of buttock ~ 2016  . Depression   .  Diabetic neuropathy ([place])    Archie Endo 08/23/2017  . DKA (diabetic ketoacidoses) ([place])    recent/notes 01/06/2015  . Hyperlipidemia   . Hypertension   . Toe ulcer ([place])    left great toe/notes 08/23/2017  . Type II diabetes mellitus ([place])     MEDICATIONS AT HOME: Prior to Admission medications   Medication Sig Start Date End Date Taking? Authorizing Provider  amLODipine (NORVASC) 10 MG tablet  Take 1 tablet (10 mg total) by mouth daily. 12/26/16  Yes Maren Reamer, MD  atorvastatin (LIPITOR) 80 MG tablet Take 1 tablet (80 mg total) by mouth daily at 6 PM. 09/13/17  Yes Cheryln Manly, NP  Blood Glucose Monitoring Suppl (TRUE METRIX METER) w/Device KIT Use as directed 06/07/17  Yes Ladell Pier, MD  clopidogrel (PLAVIX) 75 MG tablet Take 1 tablet (75 mg total) by mouth daily with breakfast. 09/14/17  Yes Reino Bellis B, NP  docusate sodium (COLACE) 100 MG capsule Take 1 capsule (100 mg total) by mouth daily. 09/02/17  Yes Ladell Pier, MD  ferrous sulfate 325 (65 FE) MG tablet Take 1 tablet (325 mg total) by mouth 2 (two) times daily with a meal. 09/13/17  Yes Reino Bellis B, NP  gabapentin (NEURONTIN) 300 MG capsule Take 2 capsules (600 mg total) by mouth 3 (three) times daily. 06/07/17  Yes Ladell Pier, MD  hydrALAZINE (APRESOLINE) 100 MG tablet Take 1 tablet (100 mg total) by mouth 3 (three) times daily. 02/13/17  Yes Langeland, Dawn T, MD  hydrochlorothiazide (HYDRODIURIL) 25 MG tablet Take 1 tablet (25 mg total) by mouth daily. 07/12/17  Yes Ladell Pier, MD  HYDROcodone-acetaminophen (NORCO/VICODIN) 5-325 MG tablet Take 1 tablet by mouth every 6 (six) hours as needed for moderate pain. 08/08/17  Yes Edrick Kins, DPM  Insulin Pen Needle (ULTICARE MICRO PEN NEEDLES) 32G X 4 MM MISC Use as directed 07/12/17  Yes Ladell Pier, MD  LANTUS SOLOSTAR 100 UNIT/ML Solostar Pen Inject 180 Units into the skin daily. 08/29/17  Yes [provider]  losartan (COZAAR) 50 MG tablet Take 1 tablet (50 mg total) by mouth daily. 06/07/17  Yes Ladell Pier, MD  meclizine (ANTIVERT) 12.5 MG tablet Take 1 tablet (12.5 mg total) by mouth 3 (three) times daily as needed for dizziness. 02/23/16  Yes Hedges, Dellis Filbert, PA-C  Multiple Vitamins-Minerals (MULTIVITAMIN ADULT) CHEW Chew 1 tablet by mouth daily.   Yes [provider]  ondansetron (ZOFRAN) 4  MG tablet Take 1 tablet (4 mg total) by mouth every 8 (eight) hours as needed for nausea or vomiting. 07/12/17  Yes Ladell Pier, MD  potassium chloride (K-DUR) 10 MEQ tablet Take 1 tablet (10 mEq total) by mouth daily. 06/07/17  Yes Ladell Pier, MD  traZODone (DESYREL) 50 MG tablet Take 0.5-1 tablets (25-50 mg total) by mouth at bedtime as needed for sleep. 08/23/17  Yes Ladell Pier, MD  TRUEPLUS LANCETS 28G MISC Use as directed 05/14/17  Yes Tresa Garter, MD  glucose blood (TRUE METRIX BLOOD GLUCOSE TEST) test strip Use as instructed 06/07/17   Ladell Pier, MD     Objective:  EXAM:   Vitals:   09/18/17 1515  BP: (!) 168/68  Pulse: 81  Resp: 12  Temp: 97.7 F (36.5 C)  TempSrc: Oral  SpO2: 97%    General appearance : A&OX3. NAD. Non-toxic-appearing, in a wheel chair today HEENT: Atraumatic and Normocephalic.  PERRLA. EOM intact.  Neck: supple, no JVD. No cervical lymphadenopathy. No thyromegaly Chest/Lungs:  Breathing-non-labored, Good air entry bilaterally, breath sounds normal without rales, rhonchi, or wheezing  CVS: S1 S2 regular, no murmurs, gallops, rubs  L leg/hip-incision site healing well w/o ecchymoses or erythema.  No bleeding or oozing. Extremities: Bilateral Lower Ext shows no edema, both legs are warm to touch with = pulse throughout Neurology:  CN II-XII grossly intact, Non focal.   Psych:  TP linear. J/I WNL. Normal speech. Appropriate eye contact and affect.  Skin:  No Rash  Data Review Lab Results  Component Value Date   HGBA1C 9.5 08/22/2017   HGBA1C 12.2 06/07/2017   HGBA1C 11.7 12/26/2016     Assessment & Plan   1. Critical lower limb ischemia S/p PVC.  No sign infection Procedures Performed:            1. Abdominal aortogram/bilateral iliac angiogram/bifemoral runoff            2. Contralateral access(third order catheter placement)            3. Diamondback orbital rotation arthrectomy left anterior tibial             4. PTA left anterior tibial Keep all f/up appts vascular, cardiology, endocrinology and regarding gangrene LLE   2. Diabetes mellitus type 2 with complications, uncontrolled (HCC) Uncontrolled.  Keep close f/up with Dr Loanne Drilling and follow regimen and f/up as he has prescribed which is to call his office if sugars avg >200.  Blood sugar 345 today and admits to eating poorly today.  Says she is compliant on Lantus 200units daily.   - Glucose (CBG)  3. Microcytic anemia S/p transfusion in the hospital Continue Iron supplement - CBC with Differential/Platelet     Patient have been counseled extensively about nutrition and exercise  Return in about 5 weeks (around 10/23/2017) for keep 12/4 appt with Dr Rosaria Ferries.  The patient was given clear instructions to go to ER or return to medical center if symptoms don't improve, worsen or new problems develop. The patient verbalized understanding. The patient was told to call to get lab results if they haven't heard anything in the next week.     Freeman Caldron, PA-C [facility] and [place], [place]   09/18/2017, 3:33 PM

## 2017-09-18 ENCOUNTER — Ambulatory Visit: Payer: Self-pay | Attending: Internal Medicine | Admitting: Physician Assistant

## 2017-09-18 VITALS — BP 146/69 | HR 81 | Temp 97.7°F | Resp 12

## 2017-09-18 DIAGNOSIS — Z91018 Allergy to other foods: Secondary | ICD-10-CM | POA: Insufficient documentation

## 2017-09-18 DIAGNOSIS — E1140 Type 2 diabetes mellitus with diabetic neuropathy, unspecified: Secondary | ICD-10-CM | POA: Insufficient documentation

## 2017-09-18 DIAGNOSIS — Z794 Long term (current) use of insulin: Secondary | ICD-10-CM | POA: Insufficient documentation

## 2017-09-18 DIAGNOSIS — Z888 Allergy status to other drugs, medicaments and biological substances status: Secondary | ICD-10-CM | POA: Insufficient documentation

## 2017-09-18 DIAGNOSIS — Z7902 Long term (current) use of antithrombotics/antiplatelets: Secondary | ICD-10-CM | POA: Insufficient documentation

## 2017-09-18 DIAGNOSIS — IMO0002 Reserved for concepts with insufficient information to code with codable children: Secondary | ICD-10-CM

## 2017-09-18 DIAGNOSIS — E118 Type 2 diabetes mellitus with unspecified complications: Secondary | ICD-10-CM

## 2017-09-18 DIAGNOSIS — E1151 Type 2 diabetes mellitus with diabetic peripheral angiopathy without gangrene: Secondary | ICD-10-CM | POA: Insufficient documentation

## 2017-09-18 DIAGNOSIS — E1165 Type 2 diabetes mellitus with hyperglycemia: Secondary | ICD-10-CM

## 2017-09-18 DIAGNOSIS — E11621 Type 2 diabetes mellitus with foot ulcer: Secondary | ICD-10-CM | POA: Insufficient documentation

## 2017-09-18 DIAGNOSIS — L97529 Non-pressure chronic ulcer of other part of left foot with unspecified severity: Secondary | ICD-10-CM | POA: Insufficient documentation

## 2017-09-18 DIAGNOSIS — E11649 Type 2 diabetes mellitus with hypoglycemia without coma: Secondary | ICD-10-CM | POA: Insufficient documentation

## 2017-09-18 DIAGNOSIS — D509 Iron deficiency anemia, unspecified: Secondary | ICD-10-CM

## 2017-09-18 DIAGNOSIS — E785 Hyperlipidemia, unspecified: Secondary | ICD-10-CM | POA: Insufficient documentation

## 2017-09-18 DIAGNOSIS — I493 Ventricular premature depolarization: Secondary | ICD-10-CM | POA: Insufficient documentation

## 2017-09-18 DIAGNOSIS — Z9889 Other specified postprocedural states: Secondary | ICD-10-CM | POA: Insufficient documentation

## 2017-09-18 DIAGNOSIS — I998 Other disorder of circulatory system: Secondary | ICD-10-CM

## 2017-09-18 DIAGNOSIS — I10 Essential (primary) hypertension: Secondary | ICD-10-CM | POA: Insufficient documentation

## 2017-09-18 DIAGNOSIS — I70229 Atherosclerosis of native arteries of extremities with rest pain, unspecified extremity: Secondary | ICD-10-CM

## 2017-09-18 LAB — GLUCOSE, POCT (MANUAL RESULT ENTRY): POC GLUCOSE: 345 mg/dL — AB (ref 70–99)

## 2017-09-18 NOTE — Patient Instructions (Signed)
Check sugars as directed and follow-up with Dr Loanne Drilling per his instructions.  Call his office if your blood sugars are avg over 200

## 2017-09-19 LAB — CBC WITH DIFFERENTIAL/PLATELET
Basophils Absolute: 0 10*3/uL (ref 0.0–0.2)
Basos: 0 %
EOS (ABSOLUTE): 0.2 10*3/uL (ref 0.0–0.4)
EOS: 3 %
HEMATOCRIT: 31.4 % — AB (ref 34.0–46.6)
Hemoglobin: 9.6 g/dL — ABNORMAL LOW (ref 11.1–15.9)
IMMATURE GRANULOCYTES: 0 %
Immature Grans (Abs): 0 10*3/uL (ref 0.0–0.1)
Lymphocytes Absolute: 1.7 10*3/uL (ref 0.7–3.1)
Lymphs: 22 %
MCH: 26.2 pg — ABNORMAL LOW (ref 26.6–33.0)
MCHC: 30.6 g/dL — ABNORMAL LOW (ref 31.5–35.7)
MCV: 86 fL (ref 79–97)
MONOS ABS: 0.4 10*3/uL (ref 0.1–0.9)
Monocytes: 5 %
NEUTROS PCT: 70 %
Neutrophils Absolute: 5.3 10*3/uL (ref 1.4–7.0)
PLATELETS: 383 10*3/uL — AB (ref 150–379)
RBC: 3.66 x10E6/uL — AB (ref 3.77–5.28)
RDW: 14.2 % (ref 12.3–15.4)
WBC: 7.7 10*3/uL (ref 3.4–10.8)

## 2017-09-24 ENCOUNTER — Ambulatory Visit (HOSPITAL_COMMUNITY)
Admission: RE | Admit: 2017-09-24 | Discharge: 2017-09-24 | Disposition: A | Discharge status: Home / Self Care | Payer: Self-pay | Source: Ambulatory Visit | Attending: Cardiovascular Disease | Admitting: Cardiovascular Disease

## 2017-09-24 DIAGNOSIS — I70229 Atherosclerosis of native arteries of extremities with rest pain, unspecified extremity: Secondary | ICD-10-CM

## 2017-09-24 DIAGNOSIS — I70202 Unspecified atherosclerosis of native arteries of extremities, left leg: Secondary | ICD-10-CM | POA: Insufficient documentation

## 2017-09-24 DIAGNOSIS — I998 Other disorder of circulatory system: Secondary | ICD-10-CM | POA: Insufficient documentation

## 2017-09-25 MED FILL — HYDROCHLOROTHIAZIDE 25 MG T: 25 | 30 days supply | Qty: 30 | Fill #1

## 2017-09-25 MED FILL — LOSARTAN POTASSIUM 50 MG TA: 50 | 30 days supply | Qty: 30 | Fill #2

## 2017-09-26 ENCOUNTER — Telehealth: Payer: Self-pay | Admitting: *Deleted

## 2017-09-26 MED FILL — hydrALAZINE HCL 100 MG TABS: 100 | 30 days supply | Qty: 90 | Fill #2

## 2017-09-26 NOTE — Telephone Encounter (Signed)
Left message on voicemail to return call.    Notes recorded by Carilyn Goodpasture, RN on 09/25/2017 at 8:41 AM EST No answer ------  Notes recorded by Argentina Donovan, PA-C on 09/20/2017 at 3:15 PM EDT Please call patient. Her blood count is improving. She should continue the Iron as it will continue to help and improve her energy. Keep all follow-up appts as planned. Thanks, Freeman Caldron, PA-C

## 2017-09-28 ENCOUNTER — Ambulatory Visit (INDEPENDENT_AMBULATORY_CARE_PROVIDER_SITE_OTHER): Payer: Self-pay | Admitting: Cardiovascular Disease

## 2017-09-28 ENCOUNTER — Encounter: Payer: Self-pay | Admitting: Cardiovascular Disease

## 2017-09-28 VITALS — BP 164/66 | HR 92 | Ht 72.0 in | Wt 215.0 lb

## 2017-09-28 DIAGNOSIS — I70229 Atherosclerosis of native arteries of extremities with rest pain, unspecified extremity: Secondary | ICD-10-CM

## 2017-09-28 DIAGNOSIS — I998 Other disorder of circulatory system: Secondary | ICD-10-CM

## 2017-09-28 NOTE — Assessment & Plan Note (Signed)
Katherine [name] returns today for post hospital follow-up after her recent endovascular procedure on 09/10/17 for critical limb ischemia. She had a gangrenous left great toe. I angiogram to her revealing one-vessel runoff on the left with occluded AT and PT and a patent peroneal. I was able to recanalize her anterior tibial down to her ankle. Her hospital course was complicated by anemia requiring transfusion. Her post procedure Dopplers performed 09/24/17 revealed an increase in her left ABI from 0.67 up to 1.03. Her left leg is still somewhat swollen but improved. Her left toe does not appear to be any better. It is still unclear to me whether she'll be able to salvage the toe requiring amputation. Clearly she needs aggressive local wound care including debridement.

## 2017-09-28 NOTE — Progress Notes (Signed)
09/28/2017 Katherine [name]   1956-10-02  [id number]  Primary Physician Ladell Pier, MD Primary Cardiologist: Lorretta Harp MD Lupe Carney, [place]  HPI:  Katherine [name] is a 61 y.o. married African-American female with no children who worked as a Scientist, water quality up until several months ago. She was referred to me by Dr. Amalia Hailey for peripheral vascular evaluation because of critical limb ischemia. I last saw her in the office 09/05/17. She does have a history of treated hypertension, diabetes and hyperlipidemia. She has never had a heart attack or stroke. She does have a gangrenous left great toe is an ulcer there for several months having undergone several rounds of antibiotic therapy. Her left toe is painful. She had recent venous Doppler study that did not show evidence of DVT. I perform peripheral angiography of her 09/10/17 revealing an occluded left anterior tibial posterior tibial artery with a patent peroneal. I was able to recanalize the anterior tibial down to the foot. Her hospital course was mildly prolonged because of unexplained anemia requiring transfusion. She did not have a retroperitoneal hematoma by CT. Her subsequent lower extremity arterial Doppler studies performed on 09/24/17 showed an increase in her left ABI from 0.67 up to 1.03. She still has edema in her left leg and her left toe appears gangrenous.     Current Meds  Medication Sig  . amLODipine (NORVASC) 10 MG tablet Take 1 tablet (10 mg total) by mouth daily.  Marland Kitchen atorvastatin (LIPITOR) 80 MG tablet Take 1 tablet (80 mg total) by mouth daily at 6 PM.  . Blood Glucose Monitoring Suppl (TRUE METRIX METER) w/Device KIT Use as directed  . clopidogrel (PLAVIX) 75 MG tablet Take 1 tablet (75 mg total) by mouth daily with breakfast.  . docusate sodium (COLACE) 100 MG capsule Take 1 capsule (100 mg total) by mouth daily.  . ferrous sulfate 325 (65 FE) MG tablet Take 1 tablet (325 mg total) by mouth 2 (two) times daily with  a meal.  . gabapentin (NEURONTIN) 300 MG capsule Take 2 capsules (600 mg total) by mouth 3 (three) times daily.  Marland Kitchen glucose blood (TRUE METRIX BLOOD GLUCOSE TEST) test strip Use as instructed  . hydrALAZINE (APRESOLINE) 100 MG tablet Take 1 tablet (100 mg total) by mouth 3 (three) times daily.  . hydrochlorothiazide (HYDRODIURIL) 25 MG tablet Take 1 tablet (25 mg total) by mouth daily.  Marland Kitchen HYDROcodone-acetaminophen (NORCO/VICODIN) 5-325 MG tablet Take 1 tablet by mouth every 6 (six) hours as needed for moderate pain.  . Insulin Pen Needle (ULTICARE MICRO PEN NEEDLES) 32G X 4 MM MISC Use as directed  . LANTUS SOLOSTAR 100 UNIT/ML Solostar Pen Inject 180 Units into the skin daily.  Marland Kitchen losartan (COZAAR) 50 MG tablet Take 1 tablet (50 mg total) by mouth daily.  . meclizine (ANTIVERT) 12.5 MG tablet Take 1 tablet (12.5 mg total) by mouth 3 (three) times daily as needed for dizziness.  . Multiple Vitamins-Minerals (MULTIVITAMIN ADULT) CHEW Chew 1 tablet by mouth daily.  . ondansetron (ZOFRAN) 4 MG tablet Take 1 tablet (4 mg total) by mouth every 8 (eight) hours as needed for nausea or vomiting.  . potassium chloride (K-DUR) 10 MEQ tablet Take 1 tablet (10 mEq total) by mouth daily.  . traZODone (DESYREL) 50 MG tablet Take 0.5-1 tablets (25-50 mg total) by mouth at bedtime as needed for sleep.  . TRUEPLUS LANCETS 28G MISC Use as directed     Allergies  Allergen Reactions  .  Other Swelling    Seaweed= swelling on arms, hands and face  . Metformin And Related Nausea Only    Social History   Socioeconomic History  . Marital status: Married    Spouse name: Not on file  . Number of children: Not on file  . Years of education: Not on file  . Highest education level: Not on file  Social Needs  . Financial resource strain: Not on file  . Food insecurity - worry: Not on file  . Food insecurity - inability: Not on file  . Transportation needs - medical: Not on file  . Transportation needs -  non-medical: Not on file  Occupational History  . Not on file  Tobacco Use  . Smoking status: Never Smoker  . Smokeless tobacco: Never Used  Substance and Sexual Activity  . Alcohol use: No  . Drug use: No  . Sexual activity: No  Other Topics Concern  . Not on file  Social History Narrative  . Not on file     Review of Systems: General: negative for chills, fever, night sweats or weight changes.  Cardiovascular: negative for chest pain, dyspnea on exertion, edema, orthopnea, palpitations, paroxysmal nocturnal dyspnea or shortness of breath Dermatological: negative for rash Respiratory: negative for cough or wheezing Urologic: negative for hematuria Abdominal: negative for nausea, vomiting, diarrhea, bright red blood per rectum, melena, or hematemesis Neurologic: negative for visual changes, syncope, or dizziness All other systems reviewed and are otherwise negative except as noted above.    Blood pressure (!) 164/66, pulse 92, height 6' (1.829 m), weight 215 lb (97.5 kg).  General appearance: alert and no distress Neck: no adenopathy, no carotid bruit, no JVD, supple, symmetrical, trachea midline and thyroid not enlarged, symmetric, no tenderness/mass/nodules Lungs: clear to auscultation bilaterally Heart: regular rate and rhythm, S1, S2 normal, no murmur, click, rub or gallop Extremities: Gangrenous left great toe with 1-2+ pitting edema on the left Pulses: Palpable left dorsalis pedis Skin: Gangrenous changes left great toe Neurologic: Alert and oriented X 3, normal strength and tone. Normal symmetric reflexes. Normal coordination and gait  EKG not performed today  ASSESSMENT AND PLAN:   Critical lower limb ischemia [name] returns today for post hospital follow-up after her recent endovascular procedure on 09/10/17 for critical limb ischemia. She had a gangrenous left great toe. I angiogram to her revealing one-vessel runoff on the left with occluded AT and PT and a  patent peroneal. I was able to recanalize her anterior tibial down to her ankle. Her hospital course was complicated by anemia requiring transfusion. Her post procedure Dopplers performed 09/24/17 revealed an increase in her left ABI from 0.67 up to 1.03. Her left leg is still somewhat swollen but improved. Her left toe does not appear to be any better. It is still unclear to me whether she'll be able to salvage the toe requiring amputation. Clearly she needs aggressive local wound care including debridement.      Lorretta Harp MD FACP,FACC,FAHA, [facility] 09/28/2017 2:07 PM

## 2017-09-28 NOTE — Patient Instructions (Signed)
Medication Instructions: Your physician recommends that you continue on your current medications as directed. Please refer to the Current Medication list given to you today.  Testing/Procedures: Your physician has requested that you have a lower extremity arterial duplex. During this test, ultrasound is used to evaluate arterial blood flow in the legs. Allow one hour for this exam. There are no restrictions or special instructions.  Your physician has requested that you have an ankle brachial index (ABI). During this test an ultrasound and blood pressure cuff are used to evaluate the arteries that supply the arms and legs with blood. Allow thirty minutes for this exam. There are no restrictions or special instructions. (IN 6 MONTHS)   Follow-Up: Your physician recommends that you schedule a follow-up appointment in: 1 month with Dr. Gwenlyn Found.  If you need a refill on your cardiac medications before your next appointment, please call your pharmacy.

## 2017-10-02 NOTE — Telephone Encounter (Signed)
Left message on voicemail for patient to return call. 

## 2017-10-03 ENCOUNTER — Telehealth: Payer: Self-pay | Admitting: Internal Medicine

## 2017-10-03 NOTE — Telephone Encounter (Signed)
-----   Message from Lorretta Harp, MD sent at 09/30/2017 11:08 AM EST ----- Thx for reaching out. I've spoken with Daylene Katayama at [facility] who initially referred her to me and he is going to arrange to see her back early this week to decide re debridement abd aggressive local wound care now that I've revaascularized her vs toe/ ray amputation.. I'm going to be following closely as an OP as well.   Roderic Palau ----- Message ----- From: Ladell Pier, MD Sent: 09/29/2017   9:30 AM To: Lorretta Harp, MD  Dr.Berry: I am the PCP for Ms.Ambrocio. Just wanted to know what are the next step for Ms. Boise regarding her toe. Should I refer to wound care or are there plans to amputate?

## 2017-10-04 ENCOUNTER — Telehealth: Payer: Self-pay | Admitting: Podiatry

## 2017-10-04 ENCOUNTER — Encounter: Payer: Self-pay | Admitting: Endocrinology

## 2017-10-04 ENCOUNTER — Ambulatory Visit (INDEPENDENT_AMBULATORY_CARE_PROVIDER_SITE_OTHER): Payer: Self-pay | Admitting: Endocrinology

## 2017-10-04 VITALS — BP 190/82 | HR 75 | Wt 219.0 lb

## 2017-10-04 DIAGNOSIS — E118 Type 2 diabetes mellitus with unspecified complications: Secondary | ICD-10-CM

## 2017-10-04 DIAGNOSIS — E041 Nontoxic single thyroid nodule: Secondary | ICD-10-CM

## 2017-10-04 DIAGNOSIS — IMO0002 Reserved for concepts with insufficient information to code with codable children: Secondary | ICD-10-CM

## 2017-10-04 DIAGNOSIS — E1165 Type 2 diabetes mellitus with hyperglycemia: Secondary | ICD-10-CM

## 2017-10-04 NOTE — Telephone Encounter (Signed)
Left voicemail for pt to call office to schedule appt. With Dr. Amalia Hailey for great toe ulcer.

## 2017-10-04 NOTE — Patient Instructions (Addendum)
check your blood sugar twice a day.  vary the time of day when you check, between before the 3 meals, and at bedtime.  also check if you have symptoms of your blood sugar being too high or too low.  please keep a record of the readings and bring it to your next appointment here (or you can bring the meter itself).  You can write it on any piece of paper.  please call us sooner if your blood sugar goes below 70, or if you have a lot of readings over 200.   blood tests are requested for you today.  We'll let you know about the results. If it is high, we should change to a faster-acting daily insulin.  If it is good, we'll adjust the current insulin. Let's check the ultrasound.  you will receive a phone call, about a day and time for an appointment. Please come back for a follow-up appointment in 2 months.

## 2017-10-04 NOTE — Telephone Encounter (Signed)
Good morning, I just got a call from someone so I just chose the nurse. My foot doctor is Dr. Amalia Hailey and Rene Paci been waiting on this call so if you can call me back at [phone]. Thank you.

## 2017-10-04 NOTE — Progress Notes (Signed)
Subjective:    Patient ID: Katherine [name], female    DOB: Sep 08, 1956, 61 y.o.   MRN: [id number]  HPI Pt returns for f/u of diabetes mellitus: DM type: 1 Dx'ed: 7371 Complications: polyneuropathy, gastroparesis, foot ulcer, and DR Therapy: insulin since 2014 GDM: never DKA: once (2016) Severe hypoglycemia: never.   Pancreatitis: never Pancreatic imaging: normal on 2016 CT.   Other: she gets insulin through [facility]; she declines multiple daily injections; she did not tolerate levemir (rash).   Interval history: no cbg record, but states cbg's are often low, in the fasting state. She takes lantus 90-180 units qam, according to cbg's.  She says cbg's are only in the 100's later in the day.   Past Medical History:  Diagnosis Date  . Boil of buttock ~ 2016  . Depression   . Diabetic neuropathy ([place])    Archie Endo 08/23/2017  . DKA (diabetic ketoacidoses) ([place])    recent/notes 01/06/2015  . Hyperlipidemia   . Hypertension   . Toe ulcer ([place])    left great toe/notes 08/23/2017  . Type II diabetes mellitus ([place])     Past Surgical History:  Procedure Laterality Date  . CHOLECYSTECTOMY  01/24/2003   Archie Endo 04/04/2011  . INCISION AND DRAINAGE ABSCESS  03/17/2012   "right but; left lower abdoment"/notes 03/17/2012  . LOWER EXTREMITY INTERVENTION N/A 09/10/2017   Performed by Lorretta Harp, MD at [facility]  . PERIPHERAL VASCULAR ATHERECTOMY  09/10/2017   Performed by Lorretta Harp, MD at [facility]    Social History   Socioeconomic History  . Marital status: Married    Spouse name: Not on file  . Number of children: Not on file  . Years of education: Not on file  . Highest education level: Not on file  Social Needs  . Financial resource strain: Not on file  . Food insecurity - worry: Not on file  . Food insecurity - inability: Not on file  . Transportation needs - medical: Not on file  . Transportation needs - non-medical: Not on file  Occupational History    . Not on file  Tobacco Use  . Smoking status: Never Smoker  . Smokeless tobacco: Never Used  Substance and Sexual Activity  . Alcohol use: No  . Drug use: No  . Sexual activity: No  Other Topics Concern  . Not on file  Social History Narrative  . Not on file    Current Outpatient Medications on File Prior to Visit  Medication Sig Dispense Refill  . amLODipine (NORVASC) 10 MG tablet Take 1 tablet (10 mg total) by mouth daily. 90 tablet 3  . atorvastatin (LIPITOR) 80 MG tablet Take 1 tablet (80 mg total) by mouth daily at 6 PM. 30 tablet 0  . Blood Glucose Monitoring Suppl (TRUE METRIX METER) w/Device KIT Use as directed 1 kit 0  . clopidogrel (PLAVIX) 75 MG tablet Take 1 tablet (75 mg total) by mouth daily with breakfast. 90 tablet 0  . docusate sodium (COLACE) 100 MG capsule Take 1 capsule (100 mg total) by mouth daily. 30 capsule 4  . ferrous sulfate 325 (65 FE) MG tablet Take 1 tablet (325 mg total) by mouth 2 (two) times daily with a meal. 60 tablet 0  . gabapentin (NEURONTIN) 300 MG capsule Take 2 capsules (600 mg total) by mouth 3 (three) times daily. 180 capsule 5  . glucose blood (TRUE METRIX BLOOD GLUCOSE TEST) test strip Use as instructed  100 each 12  . hydrALAZINE (APRESOLINE) 100 MG tablet Take 1 tablet (100 mg total) by mouth 3 (three) times daily. 90 tablet 2  . hydrochlorothiazide (HYDRODIURIL) 25 MG tablet Take 1 tablet (25 mg total) by mouth daily. 30 tablet 6  . HYDROcodone-acetaminophen (NORCO/VICODIN) 5-325 MG tablet Take 1 tablet by mouth every 6 (six) hours as needed for moderate pain. 30 tablet 0  . Insulin Pen Needle (ULTICARE MICRO PEN NEEDLES) 32G X 4 MM MISC Use as directed 100 each 11  . LANTUS SOLOSTAR 100 UNIT/ML Solostar Pen Inject 180 Units into the skin daily.  3  . losartan (COZAAR) 50 MG tablet Take 1 tablet (50 mg total) by mouth daily. 30 tablet 6  . meclizine (ANTIVERT) 12.5 MG tablet Take 1 tablet (12.5 mg total) by mouth 3 (three) times daily as  needed for dizziness. 30 tablet 0  . Multiple Vitamins-Minerals (MULTIVITAMIN ADULT) CHEW Chew 1 tablet by mouth daily.    . ondansetron (ZOFRAN) 4 MG tablet Take 1 tablet (4 mg total) by mouth every 8 (eight) hours as needed for nausea or vomiting. 20 tablet 0  . potassium chloride (K-DUR) 10 MEQ tablet Take 1 tablet (10 mEq total) by mouth daily. 30 tablet 6  . traZODone (DESYREL) 50 MG tablet Take 0.5-1 tablets (25-50 mg total) by mouth at bedtime as needed for sleep. 30 tablet 3  . TRUEPLUS LANCETS 28G MISC Use as directed 100 each 12  . [DISCONTINUED] sitaGLIPtin (JANUVIA) 100 MG tablet Take 100 mg by mouth daily.     No current facility-administered medications on file prior to visit.     Allergies  Allergen Reactions  . Other Swelling    Seaweed= swelling on arms, hands and face  . Metformin And Related Nausea Only    Family History  Problem Relation Age of Onset  . Diabetes Father   . Hypertension Father   . Kidney disease Mother   . Hypertension Mother   . Cancer Maternal Grandmother   . Hypertension Maternal Grandfather   . Colon cancer Neg Hx     BP (!) 190/82 (BP Location: Right Arm, Patient Position: Sitting, Cuff Size: Normal)   Pulse 75   Wt 219 lb (99.3 kg)   SpO2 98%   BMI 29.70 kg/m    Review of Systems Denies LOC.     Objective:   Physical Exam VITAL SIGNS:  See vs page GENERAL: no distress head: no deformity  eyes: no periorbital swelling, no proptosis  external nose and ears are normal  mouth: no lesion seen NECK: There is a 2 cm right thyroid nodule LUNGS:  Clear to auscultation HEART:  Regular rate and rhythm without murmurs noted. Normal S1,S2.   Ext: no leg edema Feet: bandaged      Assessment & Plan:  Insulin-requiring type 2 DM, with DR.  Uncertain glycemic control. Thyroid nodule, new.   Patient Instructions  check your blood sugar twice a day.  vary the time of day when you check, between before the 3 meals, and at bedtime.   also check if you have symptoms of your blood sugar being too high or too low.  please keep a record of the readings and bring it to your next appointment here (or you can bring the meter itself).  You can write it on any piece of paper.  please call us sooner if your blood sugar goes below 70, or if you have a lot of readings over 200.  blood tests are requested for you today.  We'll let you know about the results. If it is high, we should change to a faster-acting daily insulin.  If it is good, we'll adjust the current insulin. Let's check the ultrasound.  you will receive a phone call, about a day and time for an appointment. Please come back for a follow-up appointment in 2 months.

## 2017-10-04 NOTE — Telephone Encounter (Signed)
-----   Message from Edrick Kins, DPM sent at 09/28/2017  2:26 PM EST ----- Regarding: Patient needs an appt with me Here's another one...  Vascular called and said she needs an appt with me next week for possible amputation. She has a great toe ulcer.   Thanks, Dr. Amalia Hailey

## 2017-10-04 NOTE — Telephone Encounter (Signed)
Pt name and DOB verified. Pt aware of result message and result. Pt verified understanding.

## 2017-10-08 ENCOUNTER — Other Ambulatory Visit: Payer: Self-pay | Admitting: Endocrinology

## 2017-10-08 ENCOUNTER — Encounter: Payer: Self-pay | Admitting: Podiatry

## 2017-10-08 ENCOUNTER — Ambulatory Visit: Payer: No Typology Code available for payment source

## 2017-10-08 ENCOUNTER — Ambulatory Visit: Payer: No Typology Code available for payment source | Admitting: Podiatry

## 2017-10-08 VITALS — Temp 98.4°F

## 2017-10-08 DIAGNOSIS — E1351 Other specified diabetes mellitus with diabetic peripheral angiopathy without gangrene: Secondary | ICD-10-CM

## 2017-10-08 DIAGNOSIS — L97522 Non-pressure chronic ulcer of other part of left foot with fat layer exposed: Secondary | ICD-10-CM

## 2017-10-08 DIAGNOSIS — I96 Gangrene, not elsewhere classified: Secondary | ICD-10-CM

## 2017-10-08 LAB — FRUCTOSAMINE: Fructosamine: 399 umol/L — ABNORMAL HIGH (ref 190–270)

## 2017-10-08 MED ORDER — INSULIN ISOPHANE HUMAN 100 UNIT/ML KWIKPEN: 180.0000 [IU] | PEN_INJECTOR | SUBCUTANEOUS | 11 refills | Status: DC

## 2017-10-08 MED ORDER — SULFAMETHOXAZOLE-TRIMETHOPRIM 800-160 MG PO TABS: 1.0000 | ORAL_TABLET | Freq: Two times a day (BID) | ORAL | 0 refills | Status: DC

## 2017-10-08 MED ORDER — HYDROCODONE-ACETAMINOPHEN 5-325 MG PO TABS: 1.0000 | ORAL_TABLET | Freq: Four times a day (QID) | ORAL | 0 refills | Status: DC | PRN

## 2017-10-08 MED FILL — SULFAMETHOXAZOLE-TMP DS TAB: 800-160 | 14 days supply | Qty: 28 | Fill #0

## 2017-10-08 NOTE — Patient Instructions (Signed)
Pre-Operative Instructions  Congratulations, you have decided to take an important step towards improving your quality of life.  You can be assured that the doctors and staff at [facility] will be with you every step of the way.  Here are some important things you should know:  1. Plan to be at the surgery center/hospital at least 1 (one) hour prior to your scheduled time, unless otherwise directed by the surgical center/hospital staff.  You must have a responsible adult accompany you, remain during the surgery and drive you home.  Make sure you have directions to the surgical center/hospital to ensure you arrive on time. 2. If you are having surgery at [facility] or  hospitals, you will need a copy of your medical history and physical form from your family physician within one month prior to the date of surgery. We will give you a form for your primary physician to complete.  3. We make every effort to accommodate the date you request for surgery.  However, there are times where surgery dates or times have to be moved.  We will contact you as soon as possible if a change in schedule is required.   4. No aspirin/ibuprofen for one week before surgery.  If you are on aspirin, any non-steroidal anti-inflammatory medications (Mobic, Aleve, Ibuprofen) should not be taken seven (7) days prior to your surgery.  You make take Tylenol for pain prior to surgery.  5. Medications - If you are taking daily heart and blood pressure medications, seizure, reflux, allergy, asthma, anxiety, pain or diabetes medications, make sure you notify the surgery center/hospital before the day of surgery so they can tell you which medications you should take or avoid the day of surgery. 6. No food or drink after midnight the night before surgery unless directed otherwise by surgical center/hospital staff. 7. No alcoholic beverages 24-hours prior to surgery.  No smoking 24-hours prior or 24-hours after  surgery. 8. Wear loose pants or shorts. They should be loose enough to fit over bandages, boots, and casts. 9. Don't wear slip-on shoes. Sneakers are preferred. 10. Bring your boot with you to the surgery center/hospital.  Also bring crutches or a walker if your physician has prescribed it for you.  If you do not have this equipment, it will be provided for you after surgery. 11. If you have not been contacted by the surgery center/hospital by the day before your surgery, call to confirm the date and time of your surgery. 12. Leave-time from work may vary depending on the type of surgery you have.  Appropriate arrangements should be made prior to surgery with your employer. 13. Prescriptions will be provided immediately following surgery by your doctor.  Fill these as soon as possible after surgery and take the medication as directed. Pain medications will not be refilled on weekends and must be approved by the doctor. 14. Remove nail polish on the operative foot and avoid getting pedicures prior to surgery. 15. Wash the night before surgery.  The night before surgery wash the foot and leg well with water and the antibacterial soap provided. Be sure to pay special attention to beneath the toenails and in between the toes.  Wash for at least three (3) minutes. Rinse thoroughly with water and dry well with a towel.  Perform this wash unless told not to do so by your physician.  Enclosed: 1 Ice pack (please put in freezer the night before surgery)   1 Hibiclens skin cleaner     Pre-op instructions  If you have any questions regarding the instructions, please do not hesitate to call our office.  [place]: [address], [place], [place] [address] -- [phone]  [place]: [address] [place], [place] [address] -- [phone]  [place]: [address]  [place], [place] [address] -- [phone]  [place]: [address], [address], [place],  [address] -- [phone]  Website:  [email or web address] 

## 2017-10-10 ENCOUNTER — Other Ambulatory Visit: Payer: Self-pay

## 2017-10-13 NOTE — H&P (View-Only) (Signed)
   Subjective:  61 year old female with a history of diabetes mellitus presents today for follow-up evaluation regarding an ulceration to the left great toe. She states the toe is black and malodorous. She denies taking any antibiotics at this time. She has bene soaking the toe in betadine. She also is requesting a refill on pain medications. Patient. presents today for further treatment and evaluation.   Past Medical History:  Diagnosis Date  . Boil of buttock ~ 2016  . Depression   . Diabetic neuropathy ([place])    Archie Endo 08/23/2017  . DKA (diabetic ketoacidoses) ([place])    recent/notes 01/06/2015  . Hyperlipidemia   . Hypertension   . Toe ulcer ([place])    left great toe/notes 08/23/2017  . Type II diabetes mellitus ([place])      . Objective/Physical Exam General: The patient is alert and oriented x3 in no acute distress.  Dermatology: Ulcer noted with necrotic tissue encompassing the left great toe. There is partial overlying eschar noted. Discoloration of the left great toe also noted. Mild malodor. No proximal streaking or erythema noted proximal to the great toe.   Vascular: Nonpalpable pedal pulses bilaterally. There is some moderate edema noted to the left foot. There is also delayed capillary refill.  Neurological: Epicritic and protective threshold absent bilaterally.   Musculoskeletal Exam: Range of motion within normal limits to all pedal and ankle joints bilateral. Muscle strength 5/5 in all groups bilateral.   Radiographic exam: No sign of obvious osteolytic process. Cortices appear intact. Diffuse radiolucency noted throughout the osseous structures consistent with osteoarthritis  Assessment: #1 ischemic gangrene left great toe #2 PVD LLE   Plan of Care:  #1 Patient was evaluated. X-rays reviewed today #2 Today we discussed the conservative versus surgical management of the presenting pathology. The patient opts for surgical management. All possible complications and  details of the procedure were explained. All patient questions were answered. No guarantees were expressed or implied. #3 Authorization for surgery was initiated today. Surgery will consist of incision and drainage left foot, amputation left great toe. #4 Prescription for Bactrim DS #28 provided for patient. #5 Prescription for Vicodin 5/325 mg provided for patient. #6 Continue using Iodine daily.  #7 Return to clinic one week post op.   Edrick Kins, DPM [facility]  Dr. Edrick Kins, [place]                                        [place], [place] [address]                Office [phone]  Fax [phone]

## 2017-10-13 NOTE — Progress Notes (Signed)
   Subjective:  61 year old female with a history of diabetes mellitus presents today for follow-up evaluation regarding an ulceration to the left great toe. She states the toe is black and malodorous. She denies taking any antibiotics at this time. She has bene soaking the toe in betadine. She also is requesting a refill on pain medications. Patient. presents today for further treatment and evaluation.   Past Medical History:  Diagnosis Date  . Boil of buttock ~ 2016  . Depression   . Diabetic neuropathy ([place])    Archie Endo 08/23/2017  . DKA (diabetic ketoacidoses) ([place])    recent/notes 01/06/2015  . Hyperlipidemia   . Hypertension   . Toe ulcer ([place])    left great toe/notes 08/23/2017  . Type II diabetes mellitus ([place])      . Objective/Physical Exam General: The patient is alert and oriented x3 in no acute distress.  Dermatology: Ulcer noted with necrotic tissue encompassing the left great toe. There is partial overlying eschar noted. Discoloration of the left great toe also noted. Mild malodor. No proximal streaking or erythema noted proximal to the great toe.   Vascular: Nonpalpable pedal pulses bilaterally. There is some moderate edema noted to the left foot. There is also delayed capillary refill.  Neurological: Epicritic and protective threshold absent bilaterally.   Musculoskeletal Exam: Range of motion within normal limits to all pedal and ankle joints bilateral. Muscle strength 5/5 in all groups bilateral.   Radiographic exam: No sign of obvious osteolytic process. Cortices appear intact. Diffuse radiolucency noted throughout the osseous structures consistent with osteoarthritis  Assessment: #1 ischemic gangrene left great toe #2 PVD LLE   Plan of Care:  #1 Patient was evaluated. X-rays reviewed today #2 Today we discussed the conservative versus surgical management of the presenting pathology. The patient opts for surgical management. All possible complications and  details of the procedure were explained. All patient questions were answered. No guarantees were expressed or implied. #3 Authorization for surgery was initiated today. Surgery will consist of incision and drainage left foot, amputation left great toe. #4 Prescription for Bactrim DS #28 provided for patient. #5 Prescription for Vicodin 5/325 mg provided for patient. #6 Continue using Iodine daily.  #7 Return to clinic one week post op.   Edrick Kins, DPM [facility]  Dr. Edrick Kins, [place]                                        [place], [place] [address]                Office [phone]  Fax [phone]

## 2017-10-22 MED FILL — ?CLOPIDOGREL 75MG TAB: 75 | 30 days supply | Qty: 30 | Fill #1

## 2017-10-23 ENCOUNTER — Ambulatory Visit: Payer: Self-pay | Attending: Internal Medicine | Admitting: Internal Medicine

## 2017-10-23 ENCOUNTER — Encounter: Payer: Self-pay | Admitting: Internal Medicine

## 2017-10-23 ENCOUNTER — Other Ambulatory Visit: Payer: Self-pay

## 2017-10-23 VITALS — BP 129/64 | HR 76 | Temp 98.2°F | Resp 18 | Ht 72.0 in | Wt 215.0 lb

## 2017-10-23 DIAGNOSIS — Z79899 Other long term (current) drug therapy: Secondary | ICD-10-CM | POA: Insufficient documentation

## 2017-10-23 DIAGNOSIS — L97529 Non-pressure chronic ulcer of other part of left foot with unspecified severity: Secondary | ICD-10-CM | POA: Insufficient documentation

## 2017-10-23 DIAGNOSIS — F32A Depression, unspecified: Secondary | ICD-10-CM

## 2017-10-23 DIAGNOSIS — D649 Anemia, unspecified: Secondary | ICD-10-CM

## 2017-10-23 DIAGNOSIS — F329 Major depressive disorder, single episode, unspecified: Secondary | ICD-10-CM | POA: Insufficient documentation

## 2017-10-23 DIAGNOSIS — E1165 Type 2 diabetes mellitus with hyperglycemia: Secondary | ICD-10-CM | POA: Insufficient documentation

## 2017-10-23 DIAGNOSIS — Z01818 Encounter for other preprocedural examination: Secondary | ICD-10-CM

## 2017-10-23 DIAGNOSIS — E118 Type 2 diabetes mellitus with unspecified complications: Secondary | ICD-10-CM

## 2017-10-23 DIAGNOSIS — Z794 Long term (current) use of insulin: Secondary | ICD-10-CM

## 2017-10-23 DIAGNOSIS — Z23 Encounter for immunization: Secondary | ICD-10-CM

## 2017-10-23 DIAGNOSIS — E11621 Type 2 diabetes mellitus with foot ulcer: Secondary | ICD-10-CM | POA: Insufficient documentation

## 2017-10-23 DIAGNOSIS — I10 Essential (primary) hypertension: Secondary | ICD-10-CM

## 2017-10-23 DIAGNOSIS — I96 Gangrene, not elsewhere classified: Secondary | ICD-10-CM

## 2017-10-23 LAB — POCT CBG (FASTING - GLUCOSE)-MANUAL ENTRY: Glucose Fasting, POC: 174 mg/dL — AB (ref 70–99)

## 2017-10-23 MED ORDER — FERROUS SULFATE 325 (65 FE) MG PO TABS: 325.0000 mg | ORAL_TABLET | Freq: Two times a day (BID) | ORAL | 0 refills | Status: DC

## 2017-10-23 MED ORDER — SERTRALINE HCL 50 MG PO TABS: ORAL_TABLET | ORAL | 3 refills | Status: DC

## 2017-10-23 MED FILL — POTASSIUM CL 10 MEQ TAB SA: 10 | 30 days supply | Qty: 30 | Fill #0

## 2017-10-23 MED FILL — FERROUS SULFATE 325 MG TAB: 325 (65 FE) | 30 days supply | Qty: 60 | Fill #0

## 2017-10-23 NOTE — Progress Notes (Signed)
Patient ID: Katherine [name], female    DOB: 1956/10/16  MRN: [id number]  CC: Hypertension   Subjective: Katherine [name] is a 61 y.o. female who presents for chronic ds management and pre-surgical eval Her concerns today include:  Hx of HTN, DM with neuropathy and possible gastroparesis, HL, depression, GERD.   Recent LT critical limb ischemia requiring arthrectomy of LT anterior Tibial artery.  Her ABI improved from 0.67 to 1.03 with this procedure but unfortunately, ulcer on LT big toe did not improve. She was tried with abx by podiatrist. She now has gangrene and amputation has been recommended and is scheduled for the end of this wk. -pt very depressed about this.  She soaks the foot but can not stand to look at the toe.  She is embarrassed by the smell.  she is wondering about getting a second opinion of whether amputation is needed or not -she has preop instructions from the podiatrist's office but has not looked at them as yet -she has not had any stress test in past yr  She was walking 1/2 mile at [facility] 3 x a wk from Jan- end Sept 2018 Denies any CP/SOB when she was walking regularly.   2. DM:  Followed by Dr. Loanne Drilling at [place]. She saw him last mth. Did not have BS log for him to do a thorough eval On Lantus 160 units in a.m.   -checks BS BID in a.m and dinner. Gives range 150-155.   3. HTN: compliant with meds and salt restriction  4. Anemia: was taking BID. Just ran out of iron. Needs RF On Plavix but out x 3-4 days.   Patient Active Problem List   Diagnosis Date Noted  . Thyroid nodule 10/04/2017  . Microcytic anemia 09/11/2017  . Critical lower limb ischemia 09/10/2017  . Ulcer of left foot ([place]) 08/24/2017  . Retinopathy 08/22/2017  . Thyroid enlargement 07/12/2017  . Dyslipidemia 09/20/2016  . DEPRESSION 09/10/2009  . LEG CRAMPS 05/19/2009  . GERD 02/15/2009  . Gastroparesis 09/03/2008  . ONYCHOMYCOSIS 07/14/2008  . DENTAL CARIES 07/14/2008  . Diabetes  mellitus type 2 with complications, uncontrolled ([place]) 09/16/2003  . HYPERTENSION, BENIGN ESSENTIAL 09/16/2003     Current Outpatient Medications on File Prior to Visit  Medication Sig Dispense Refill  . amLODipine (NORVASC) 10 MG tablet Take 1 tablet (10 mg total) by mouth daily. 90 tablet 3  . atorvastatin (LIPITOR) 80 MG tablet Take 1 tablet (80 mg total) by mouth daily at 6 PM. 30 tablet 0  . Blood Glucose Monitoring Suppl (TRUE METRIX METER) w/Device KIT Use as directed 1 kit 0  . clopidogrel (PLAVIX) 75 MG tablet Take 1 tablet (75 mg total) by mouth daily with breakfast. 90 tablet 0  . docusate sodium (COLACE) 100 MG capsule Take 1 capsule (100 mg total) by mouth daily. (Patient taking differently: Take 100 mg by mouth daily as needed for mild constipation. ) 30 capsule 4  . gabapentin (NEURONTIN) 300 MG capsule Take 2 capsules (600 mg total) by mouth 3 (three) times daily. 180 capsule 5  . glucose blood (TRUE METRIX BLOOD GLUCOSE TEST) test strip Use as instructed 100 each 12  . hydrALAZINE (APRESOLINE) 100 MG tablet Take 1 tablet (100 mg total) by mouth 3 (three) times daily. 90 tablet 2  . hydrochlorothiazide (HYDRODIURIL) 25 MG tablet Take 1 tablet (25 mg total) by mouth daily. 30 tablet 6  . HYDROcodone-acetaminophen (NORCO/VICODIN) 5-325 MG tablet Take 1 tablet every  6 (six) hours as needed by mouth for moderate pain. 30 tablet 0  . Insulin NPH, Human,, Isophane, (HUMULIN N KWIKPEN) 100 UNIT/ML Kiwkpen Inject 180 Units every morning into the skin. And pen needles 1/day 60 mL 11  . Insulin Pen Needle (ULTICARE MICRO PEN NEEDLES) 32G X 4 MM MISC Use as directed 100 each 11  . losartan (COZAAR) 50 MG tablet Take 1 tablet (50 mg total) by mouth daily. 30 tablet 6  . Multiple Vitamins-Minerals (MULTIVITAMIN ADULT) CHEW Chew 1 tablet by mouth daily.    . ondansetron (ZOFRAN) 4 MG tablet Take 1 tablet (4 mg total) by mouth every 8 (eight) hours as needed for nausea or vomiting. 20 tablet 0    . potassium chloride (K-DUR) 10 MEQ tablet Take 1 tablet (10 mEq total) by mouth daily. 30 tablet 6  . traZODone (DESYREL) 50 MG tablet Take 0.5-1 tablets (25-50 mg total) by mouth at bedtime as needed for sleep. 30 tablet 3  . TRUEPLUS LANCETS 28G MISC Use as directed 100 each 12  . [DISCONTINUED] sitaGLIPtin (JANUVIA) 100 MG tablet Take 100 mg by mouth daily.     No current facility-administered medications on file prior to visit.     Allergies  Allergen Reactions  . Other Swelling    Seaweed= swelling on arms, hands and face  . Metformin And Related Nausea Only    Social History   Socioeconomic History  . Marital status: Married    Spouse name: Not on file  . Number of children: Not on file  . Years of education: Not on file  . Highest education level: Not on file  Social Needs  . Financial resource strain: Not on file  . Food insecurity - worry: Not on file  . Food insecurity - inability: Not on file  . Transportation needs - medical: Not on file  . Transportation needs - non-medical: Not on file  Occupational History  . Not on file  Tobacco Use  . Smoking status: Never Smoker  . Smokeless tobacco: Never Used  Substance and Sexual Activity  . Alcohol use: No  . Drug use: No  . Sexual activity: No  Other Topics Concern  . Not on file  Social History Narrative  . Not on file    Family History  Problem Relation Age of Onset  . Diabetes Father   . Hypertension Father   . Kidney disease Mother   . Hypertension Mother   . Cancer Maternal Grandmother   . Hypertension Maternal Grandfather   . Diabetes Paternal Aunt   . Diabetes Paternal Uncle   . Diabetes Paternal Grandfather   . Colon cancer Neg Hx     Past Surgical History:  Procedure Laterality Date  . CHOLECYSTECTOMY  01/24/2003   Archie Endo 04/04/2011  . INCISION AND DRAINAGE ABSCESS  03/17/2012   "right but; left lower abdoment"/notes 03/17/2012  . LOWER EXTREMITY INTERVENTION N/A 09/10/2017   Procedure:  LOWER EXTREMITY INTERVENTION;  Surgeon: Lorretta Harp, MD;  Location: [facility];  Service: Cardiovascular;  Laterality: N/A;  . PERIPHERAL VASCULAR ATHERECTOMY  09/10/2017   Procedure: PERIPHERAL VASCULAR ATHERECTOMY;  Surgeon: Lorretta Harp, MD;  Location: [facility];  Service: Cardiovascular;;  left AT    ROS: Review of Systems As stated above PHYSICAL EXAM: BP 129/64 (BP Location: Left Arm, Patient Position: Sitting, Cuff Size: Large)   Pulse 76   Temp 98.2 F (36.8 C) (Oral)   Resp 18   Ht  6' (1.829 m)   Wt 215 lb (97.5 kg)   SpO2 100%   BMI 29.16 kg/m   Physical Exam General appearance - alert, well appearing, and in no distress Mental status - flat affect. Somewhat tearful in talking about her foot Eyes - pupils equal and reactive, extraocular eye movements intact Mouth - mucous membranes moist, pharynx normal without lesions Neck - supple, no significant adenopathy Chest - clear to auscultation, no wheezes, rales or rhonchi, symmetric air entry Heart - normal rate, regular rhythm, normal S1, S2, 1/6 SEM LSB Abdomen - soft, nontender, nondistended, no masses or organomegaly Neurological -dec sensation on sole of feet Extremities -+ edema LT lower leg that is dec from previous Skin: LT foot - wet gangrene of over 2/3 of the big toe. malodorous   Lab Results  Component Value Date   HGBA1C 9.5 08/22/2017   EKG NSR, good RWP, no acute ischemic changes Results for orders placed or performed in visit on 10/23/17  CBC  Result Value Ref Range   WBC 6.9 3.4 - 10.8 x10E3/uL   RBC 4.30 3.77 - 5.28 x10E6/uL   Hemoglobin 11.5 11.1 - 15.9 g/dL   Hematocrit 36.2 34.0 - 46.6 %   MCV 84 79 - 97 fL   MCH 26.7 26.6 - 33.0 pg   MCHC 31.8 31.5 - 35.7 g/dL   RDW 16.2 (H) 12.3 - 15.4 %   Platelets 289 150 - 379 x10E3/uL  Comprehensive metabolic panel  Result Value Ref Range   Glucose 130 (H) 65 - 99 mg/dL   BUN 13 8 - 27 mg/dL   Creatinine, Ser 1.10 (H)  0.57 - 1.00 mg/dL   GFR calc non Af Amer 54 (L) >59 mL/min/1.73   GFR calc Af Amer 63 >59 mL/min/1.73   BUN/Creatinine Ratio 12 12 - 28   Sodium 142 134 - 144 mmol/L   Potassium 4.1 3.5 - 5.2 mmol/L   Chloride 97 96 - 106 mmol/L   CO2 29 20 - 29 mmol/L   Calcium 9.4 8.7 - 10.3 mg/dL   Total Protein 7.4 6.0 - 8.5 g/dL   Albumin 3.8 3.6 - 4.8 g/dL   Globulin, Total 3.6 1.5 - 4.5 g/dL   Albumin/Globulin Ratio 1.1 (L) 1.2 - 2.2   Bilirubin Total <0.2 0.0 - 1.2 mg/dL   Alkaline Phosphatase 127 (H) 39 - 117 IU/L   AST 13 0 - 40 IU/L   ALT 10 0 - 32 IU/L  Glucose (CBG), Fasting  Result Value Ref Range   Glucose Fasting, POC 174 (A) 70 - 99 mg/dL   Results for orders placed or performed in visit on 10/23/17  CBC  Result Value Ref Range   WBC 6.9 3.4 - 10.8 x10E3/uL   RBC 4.30 3.77 - 5.28 x10E6/uL   Hemoglobin 11.5 11.1 - 15.9 g/dL   Hematocrit 36.2 34.0 - 46.6 %   MCV 84 79 - 97 fL   MCH 26.7 26.6 - 33.0 pg   MCHC 31.8 31.5 - 35.7 g/dL   RDW 16.2 (H) 12.3 - 15.4 %   Platelets 289 150 - 379 x10E3/uL  Comprehensive metabolic panel  Result Value Ref Range   Glucose 130 (H) 65 - 99 mg/dL   BUN 13 8 - 27 mg/dL   Creatinine, Ser 1.10 (H) 0.57 - 1.00 mg/dL   GFR calc non Af Amer 54 (L) >59 mL/min/1.73   GFR calc Af Amer 63 >59 mL/min/1.73   BUN/Creatinine Ratio 12 12 - 28  Sodium 142 134 - 144 mmol/L   Potassium 4.1 3.5 - 5.2 mmol/L   Chloride 97 96 - 106 mmol/L   CO2 29 20 - 29 mmol/L   Calcium 9.4 8.7 - 10.3 mg/dL   Total Protein 7.4 6.0 - 8.5 g/dL   Albumin 3.8 3.6 - 4.8 g/dL   Globulin, Total 3.6 1.5 - 4.5 g/dL   Albumin/Globulin Ratio 1.1 (L) 1.2 - 2.2   Bilirubin Total <0.2 0.0 - 1.2 mg/dL   Alkaline Phosphatase 127 (H) 39 - 117 IU/L   AST 13 0 - 40 IU/L   ALT 10 0 - 32 IU/L  Glucose (CBG), Fasting  Result Value Ref Range   Glucose Fasting, POC 174 (A) 70 - 99 mg/dL    ASSESSMENT AND PLAN: 1. Preoperative evaluation to rule out surgical contraindication -given  that pt was actively walking 3 days a wk without CP/SOB until 07/2017, I think we can proceed with surgery. No hx of arrhythmias or CHF. Will get baseline blood test including CBC. She has been out of Plavix for several days. I told her to remain off it until after surgery which is planned for later this wk - EKG 12-Lead - Glucose (CBG), Fasting - CBC - Comprehensive metabolic panel  2. Gangrenous toe ([place]) -does not look salvageable.  Advised her to get it taken care of before she ends up losing more of the foot.  3. Essential hypertension At goal Continue current meds  4. Type 2 diabetes mellitus with complication, with long-term current use of insulin (HCC) Continue recommended insulin dose per endocrine instructions -continue home monitoring BS -await A1C results to make certain she is optimized for surgery and healing  5. Depression, unspecified depression type Discuss starting her on med for depression, and she is agreeable.  Rxn given for Zoloft. Pt to start it after surgery Will have our LCSW f/u with her - sertraline (ZOLOFT) 50 MG tablet; 1/2 tab daily x 2 wk then 1 tab PO daily  Dispense: 30 tablet; Refill: 3  6. Anemia, unspecified type - ferrous sulfate 325 (65 FE) MG tablet; Take 1 tablet (325 mg total) by mouth 2 (two) times daily with a meal.  Dispense: 60 tablet; Refill: 0  7. Need for influenza vaccination - Flu Vaccine QUAD 6+ mos PF IM (Fluarix Quad PF)  Patient was given the opportunity to ask questions.  Patient verbalized understanding of the plan and was able to repeat key elements of the plan.   Orders Placed This Encounter  Procedures  . Flu Vaccine QUAD 6+ mos PF IM (Fluarix Quad PF)  . CBC  . Comprehensive metabolic panel  . Hemoglobin A1c  . Glucose (CBG), Fasting  . EKG 12-Lead     Requested Prescriptions   Signed Prescriptions Disp Refills  . sertraline (ZOLOFT) 50 MG tablet 30 tablet 3    Sig: 1/2 tab daily x 2 wk then 1 tab PO daily  .  ferrous sulfate 325 (65 FE) MG tablet 60 tablet 0    Sig: Take 1 tablet (325 mg total) by mouth 2 (two) times daily with a meal.    Return in about 1 month (around 11/23/2017).  Karle Plumber, MD, FACP

## 2017-10-23 NOTE — Patient Instructions (Signed)
Please give appt with [facility] for the same day as her next appointment with me

## 2017-10-24 ENCOUNTER — Telehealth: Payer: Self-pay | Admitting: Internal Medicine

## 2017-10-24 ENCOUNTER — Telehealth: Payer: Self-pay | Admitting: *Deleted

## 2017-10-24 ENCOUNTER — Encounter (HOSPITAL_COMMUNITY): Payer: Self-pay | Admitting: *Deleted

## 2017-10-24 LAB — COMPREHENSIVE METABOLIC PANEL
A/G RATIO: 1.1 — AB (ref 1.2–2.2)
ALK PHOS: 127 IU/L — AB (ref 39–117)
ALT: 10 IU/L (ref 0–32)
AST: 13 IU/L (ref 0–40)
Albumin: 3.8 g/dL (ref 3.6–4.8)
BUN/Creatinine Ratio: 12 (ref 12–28)
BUN: 13 mg/dL (ref 8–27)
CHLORIDE: 97 mmol/L (ref 96–106)
CO2: 29 mmol/L (ref 20–29)
Calcium: 9.4 mg/dL (ref 8.7–10.3)
Creatinine, Ser: 1.1 mg/dL — ABNORMAL HIGH (ref 0.57–1.00)
GFR calc Af Amer: 63 mL/min/{1.73_m2} (ref >59)
GFR calc non Af Amer: 54 mL/min/{1.73_m2} — ABNORMAL LOW (ref >59)
GLUCOSE: 130 mg/dL — AB (ref 65–99)
Globulin, Total: 3.6 g/dL (ref 1.5–4.5)
POTASSIUM: 4.1 mmol/L (ref 3.5–5.2)
Sodium: 142 mmol/L (ref 134–144)
TOTAL PROTEIN: 7.4 g/dL (ref 6.0–8.5)

## 2017-10-24 LAB — CBC
HEMOGLOBIN: 11.5 g/dL (ref 11.1–15.9)
Hematocrit: 36.2 % (ref 34.0–46.6)
MCH: 26.7 pg (ref 26.6–33.0)
MCHC: 31.8 g/dL (ref 31.5–35.7)
MCV: 84 fL (ref 79–97)
PLATELETS: 289 10*3/uL (ref 150–379)
RBC: 4.3 x10E6/uL (ref 3.77–5.28)
RDW: 16.2 % — AB (ref 12.3–15.4)
WBC: 6.9 10*3/uL (ref 3.4–10.8)

## 2017-10-24 NOTE — Telephone Encounter (Signed)
C placed to pt today to give lab results and to verify whether she is on Lantus or NPH. Her VM box is full and not able to leave message.

## 2017-10-24 NOTE — Telephone Encounter (Signed)
"  She is scheduled to have surgery in the morning at 7:30 with Dr. Amalia Hailey.  We do not have orders on this patient.  If someone would put orders in for [place] so we will know what to do for her."  (Surgery is not scheduled for tomorrow.  It is scheduled for 10/26/2017 at 7:30 am.)

## 2017-10-25 ENCOUNTER — Telehealth: Payer: Self-pay | Admitting: Internal Medicine

## 2017-10-25 ENCOUNTER — Telehealth: Payer: Self-pay | Admitting: Endocrinology

## 2017-10-25 ENCOUNTER — Encounter (HOSPITAL_COMMUNITY): Payer: Self-pay | Admitting: *Deleted

## 2017-10-25 ENCOUNTER — Other Ambulatory Visit: Payer: Self-pay

## 2017-10-25 LAB — SPECIMEN STATUS REPORT

## 2017-10-25 MED ORDER — CEFAZOLIN SODIUM 10 G IJ SOLR
3.0000 g | INTRAMUSCULAR | Status: AC
  Administered 2017-10-26: 3 g via INTRAVENOUS
  Filled 2017-10-25: qty 3

## 2017-10-25 NOTE — Telephone Encounter (Signed)
Deborah:  i'm sorry for the delay getting back to you.  I think she should take half the prescribed dosage tomorrow.  I also tried calling her just now, but her voice mailbox is full.  Even if she takes the full amount, there is no need to worry in my opinion, as anesthesia will monitor her, and give her some IV D5 if necessary.  I converted this to  Phone message, so it is available to them.   Hilliard Clark

## 2017-10-25 NOTE — Telephone Encounter (Signed)
Tried to reach pt this evening. Mail box is full and I was unable to leave a message. A1C is 8.3 whih is improved from 9.5 in October. She can have the surgery as scheduled tomorrow.

## 2017-10-25 NOTE — Telephone Encounter (Signed)
-----   Message from Ladell Pier, MD sent at 10/24/2017 12:21 PM EST ----- Regarding: Question Dr. Loanne Drilling: this pt is scheduled for amputation of the LT big toe on Friday of this wk. I saw her for pre-op yesterday and she told me she is on Lantus 160 units daily but I see a prescription from you for NPH daily. I tried calling her back to verify which one she is currently taking but unable to reach via phone as yet.  In any event, if her surgery is scheduled for the morning, how many units should she take the morning of the surgery?

## 2017-10-25 NOTE — Progress Notes (Signed)
Spoke with pt for pre-op call. Pt denies cardiac history or chest pain. Pt is a type 2 diabetic. She states she had an A1C done yesterday, but results are not back. 3 months ago she states it was 9.6. She states her blood sugar usually runs high in the AM, today it was 220. Pt instructed to take 1/2 of her regular dose of Humulin N in the AM, will take 90 units. Instructed pt to check her blood sugar in the AM when she gets up. If blood sugar is 70 or below, treat with 1/2 cup of clear juice (apple or cranberry) and recheck blood sugar 15 minutes after drinking juice. Pt voiced understanding. Pt was very tearful and outright crying during phone call. She is very upset over having to lose her toe.

## 2017-10-26 ENCOUNTER — Ambulatory Visit (HOSPITAL_COMMUNITY): Payer: Self-pay | Admitting: Anesthesiology

## 2017-10-26 ENCOUNTER — Ambulatory Visit (HOSPITAL_COMMUNITY)
Admission: RE | Admit: 2017-10-26 | Discharge: 2017-10-26 | Disposition: A | Discharge status: Home / Self Care | Payer: Self-pay | Source: Ambulatory Visit | Attending: Podiatry | Admitting: Podiatry

## 2017-10-26 ENCOUNTER — Encounter: Payer: Self-pay | Admitting: Podiatry

## 2017-10-26 ENCOUNTER — Encounter (HOSPITAL_COMMUNITY): Payer: Self-pay | Admitting: *Deleted

## 2017-10-26 ENCOUNTER — Encounter (HOSPITAL_COMMUNITY)
Admission: RE | Disposition: A | Discharge status: Home / Self Care | Payer: Self-pay | Source: Ambulatory Visit | Attending: Podiatry

## 2017-10-26 DIAGNOSIS — I96 Gangrene, not elsewhere classified: Secondary | ICD-10-CM | POA: Insufficient documentation

## 2017-10-26 DIAGNOSIS — M86679 Other chronic osteomyelitis, unspecified ankle and foot: Secondary | ICD-10-CM

## 2017-10-26 DIAGNOSIS — E785 Hyperlipidemia, unspecified: Secondary | ICD-10-CM | POA: Insufficient documentation

## 2017-10-26 DIAGNOSIS — Z79899 Other long term (current) drug therapy: Secondary | ICD-10-CM | POA: Insufficient documentation

## 2017-10-26 DIAGNOSIS — Z794 Long term (current) use of insulin: Secondary | ICD-10-CM | POA: Insufficient documentation

## 2017-10-26 DIAGNOSIS — I10 Essential (primary) hypertension: Secondary | ICD-10-CM | POA: Insufficient documentation

## 2017-10-26 DIAGNOSIS — L02612 Cutaneous abscess of left foot: Secondary | ICD-10-CM | POA: Insufficient documentation

## 2017-10-26 DIAGNOSIS — E1152 Type 2 diabetes mellitus with diabetic peripheral angiopathy with gangrene: Secondary | ICD-10-CM | POA: Insufficient documentation

## 2017-10-26 DIAGNOSIS — F329 Major depressive disorder, single episode, unspecified: Secondary | ICD-10-CM | POA: Insufficient documentation

## 2017-10-26 HISTORY — DX: Anemia, unspecified: D64.9

## 2017-10-26 HISTORY — DX: Peripheral vascular disease, unspecified: I73.9

## 2017-10-26 HISTORY — PX: AMPUTATION TOE: SHX6595

## 2017-10-26 HISTORY — DX: Polyneuropathy, unspecified: G62.9

## 2017-10-26 LAB — GLUCOSE, CAPILLARY
Glucose-Capillary: 227 mg/dL — ABNORMAL HIGH (ref 65–99)
Glucose-Capillary: 232 mg/dL — ABNORMAL HIGH (ref 65–99)

## 2017-10-26 SURGERY — AMPUTATION, TOE
Anesthesia: Monitor Anesthesia Care | Laterality: Left

## 2017-10-26 MED ORDER — LIDOCAINE 2% (20 MG/ML) 5 ML SYRINGE
INTRAMUSCULAR | Status: AC
  Filled 2017-10-26: qty 5

## 2017-10-26 MED ORDER — SUCCINYLCHOLINE CHLORIDE 200 MG/10ML IV SOSY
PREFILLED_SYRINGE | INTRAVENOUS | Status: AC
  Filled 2017-10-26: qty 10

## 2017-10-26 MED ORDER — PHENYLEPHRINE 40 MCG/ML (10ML) SYRINGE FOR IV PUSH (FOR BLOOD PRESSURE SUPPORT)
PREFILLED_SYRINGE | INTRAVENOUS | Status: AC
Start: 2017-10-26 — End: 2017-10-26
  Filled 2017-10-26: qty 10

## 2017-10-26 MED ORDER — PROPOFOL 10 MG/ML IV BOLUS
INTRAVENOUS | Status: AC
  Filled 2017-10-26: qty 20

## 2017-10-26 MED ORDER — LIDOCAINE-EPINEPHRINE 1 %-1:100000 IJ SOLN
INTRAMUSCULAR | Status: AC
  Filled 2017-10-26: qty 1

## 2017-10-26 MED ORDER — MIDAZOLAM HCL 2 MG/2ML IJ SOLN
INTRAMUSCULAR | Status: AC
Start: 2017-10-26 — End: 2017-10-26
  Filled 2017-10-26: qty 2

## 2017-10-26 MED ORDER — BUPIVACAINE HCL (PF) 0.5 % IJ SOLN
INTRAMUSCULAR | Status: DC | PRN
  Administered 2017-10-26: 10 mL

## 2017-10-26 MED ORDER — LIDOCAINE HCL (PF) 1 % IJ SOLN
INTRAMUSCULAR | Status: DC | PRN
  Administered 2017-10-26: 10 mL via INTRADERMAL

## 2017-10-26 MED ORDER — FENTANYL CITRATE (PF) 100 MCG/2ML IJ SOLN: 25.0000 ug | INTRAMUSCULAR | Status: DC | PRN

## 2017-10-26 MED ORDER — ONDANSETRON HCL 4 MG/2ML IJ SOLN: 4.0000 mg | Freq: Once | INTRAMUSCULAR | Status: DC | PRN

## 2017-10-26 MED ORDER — PROPOFOL 500 MG/50ML IV EMUL
INTRAVENOUS | Status: DC | PRN
  Administered 2017-10-26: 100 ug/kg/min via INTRAVENOUS

## 2017-10-26 MED ORDER — MIDAZOLAM HCL 5 MG/5ML IJ SOLN
INTRAMUSCULAR | Status: DC | PRN
  Administered 2017-10-26: 2 mg via INTRAVENOUS

## 2017-10-26 MED ORDER — FENTANYL CITRATE (PF) 250 MCG/5ML IJ SOLN
INTRAMUSCULAR | Status: AC
  Filled 2017-10-26: qty 5

## 2017-10-26 MED ORDER — PROPOFOL 1000 MG/100ML IV EMUL
INTRAVENOUS | Status: AC
  Filled 2017-10-26: qty 100

## 2017-10-26 MED ORDER — BUPIVACAINE HCL (PF) 0.5 % IJ SOLN
INTRAMUSCULAR | Status: AC
  Filled 2017-10-26: qty 30

## 2017-10-26 MED ORDER — OXYCODONE-ACETAMINOPHEN 5-325 MG PO TABS: 1.0000 | ORAL_TABLET | Freq: Four times a day (QID) | ORAL | 0 refills | Status: DC | PRN

## 2017-10-26 MED ORDER — ROCURONIUM BROMIDE 10 MG/ML (PF) SYRINGE
PREFILLED_SYRINGE | INTRAVENOUS | Status: AC
  Filled 2017-10-26: qty 5

## 2017-10-26 MED ORDER — LIDOCAINE 2% (20 MG/ML) 5 ML SYRINGE
INTRAMUSCULAR | Status: DC | PRN
  Administered 2017-10-26: 40 mg via INTRAVENOUS

## 2017-10-26 MED ORDER — EPHEDRINE 5 MG/ML INJ
INTRAVENOUS | Status: AC
  Filled 2017-10-26: qty 10

## 2017-10-26 MED ORDER — ONDANSETRON HCL 4 MG/2ML IJ SOLN
INTRAMUSCULAR | Status: DC | PRN
  Administered 2017-10-26: 4 mg via INTRAVENOUS

## 2017-10-26 MED ORDER — SULFAMETHOXAZOLE-TRIMETHOPRIM 800-160 MG PO TABS: 1.0000 | ORAL_TABLET | Freq: Two times a day (BID) | ORAL | 0 refills | Status: DC

## 2017-10-26 MED ORDER — LACTATED RINGERS IV SOLN
INTRAVENOUS | Status: DC | PRN
  Administered 2017-10-26: 08:00:00 via INTRAVENOUS

## 2017-10-26 MED ORDER — LIDOCAINE HCL (PF) 1 % IJ SOLN
INTRAMUSCULAR | Status: AC
Start: 2017-10-26 — End: 2017-10-26
  Filled 2017-10-26: qty 30

## 2017-10-26 MED ORDER — CHLORHEXIDINE GLUCONATE 4 % EX LIQD: 60.0000 mL | Freq: Once | CUTANEOUS | Status: DC

## 2017-10-26 MED ORDER — FENTANYL CITRATE (PF) 250 MCG/5ML IJ SOLN
INTRAMUSCULAR | Status: DC | PRN
  Administered 2017-10-26: 50 ug via INTRAVENOUS

## 2017-10-26 MED FILL — SERTRALINE HCL 50 MG TABLET: 50 | 30 days supply | Qty: 30 | Fill #0

## 2017-10-26 SURGICAL SUPPLY — 41 items
BANDAGE ACE 4X5 VEL STRL LF (GAUZE/BANDAGES/DRESSINGS) ×2 IMPLANT
BLADE CORE FAN STRYKER (BLADE) ×2 IMPLANT
BLADE SURG 15 STRL LF DISP TIS (BLADE) IMPLANT
BLADE SURG 15 STRL SS (BLADE)
BNDG GAUZE ELAST 4 BULKY (GAUZE/BANDAGES/DRESSINGS) ×3 IMPLANT
CHLORAPREP W/TINT 26ML (MISCELLANEOUS) IMPLANT
COVER SURGICAL LIGHT HANDLE (MISCELLANEOUS) ×1 IMPLANT
CUFF TOURNIQUET SINGLE 18IN (TOURNIQUET CUFF) ×3 IMPLANT
CUFF TOURNIQUET SINGLE 24IN (TOURNIQUET CUFF) IMPLANT
DRSG PAD ABDOMINAL 8X10 ST (GAUZE/BANDAGES/DRESSINGS) ×3 IMPLANT
ELECT REM PT RETURN 9FT ADLT (ELECTROSURGICAL)
ELECTRODE REM PT RTRN 9FT ADLT (ELECTROSURGICAL) IMPLANT
GAUZE PACKING IODOFORM 1/4X15 (GAUZE/BANDAGES/DRESSINGS) ×2 IMPLANT
GAUZE SPONGE 4X4 12PLY STRL (GAUZE/BANDAGES/DRESSINGS) ×3 IMPLANT
GAUZE XEROFORM 1X8 LF (GAUZE/BANDAGES/DRESSINGS) ×3 IMPLANT
GLOVE BIO SURGEON STRL SZ8 (GLOVE) ×3 IMPLANT
GLOVE BIOGEL PI IND STRL 8 (GLOVE) ×1 IMPLANT
GLOVE BIOGEL PI INDICATOR 8 (GLOVE) ×2
GOWN STRL REUS W/ TWL LRG LVL3 (GOWN DISPOSABLE) ×2 IMPLANT
GOWN STRL REUS W/TWL LRG LVL3 (GOWN DISPOSABLE) ×6
KIT BASIN OR (CUSTOM PROCEDURE TRAY) ×3 IMPLANT
KIT ROOM TURNOVER OR (KITS) ×3 IMPLANT
NDL HYPO 25X1 1.5 SAFETY (NEEDLE) IMPLANT
NEEDLE 27GAX1X1/2 (NEEDLE) ×3 IMPLANT
NEEDLE HYPO 25X1 1.5 SAFETY (NEEDLE) ×3 IMPLANT
NS IRRIG 1000ML POUR BTL (IV SOLUTION) ×3 IMPLANT
PACK ORTHO EXTREMITY (CUSTOM PROCEDURE TRAY) ×3 IMPLANT
PAD ARMBOARD 7.5X6 YLW CONV (MISCELLANEOUS) ×6 IMPLANT
PAD CAST 4YDX4 CTTN HI CHSV (CAST SUPPLIES) ×1 IMPLANT
PADDING CAST COTTON 4X4 STRL (CAST SUPPLIES) ×3
SCRUB BETADINE 4OZ XXX (MISCELLANEOUS) ×3 IMPLANT
SOL PREP POV-IOD 4OZ 10% (MISCELLANEOUS) ×2 IMPLANT
SUT PROLENE 4 0 PS 2 18 (SUTURE) ×3 IMPLANT
SUT VIC AB 3-0 PS2 18 (SUTURE) ×5 IMPLANT
SUT VICRYL 4-0 PS2 18IN ABS (SUTURE) ×1 IMPLANT
SYR CONTROL 10ML LL (SYRINGE) ×3 IMPLANT
TOWEL OR 17X24 6PK STRL BLUE (TOWEL DISPOSABLE) ×3 IMPLANT
TOWEL OR 17X26 10 PK STRL BLUE (TOWEL DISPOSABLE) ×3 IMPLANT
TUBE CONNECTING 12'X1/4 (SUCTIONS) ×1
TUBE CONNECTING 12X1/4 (SUCTIONS) ×2 IMPLANT
YANKAUER SUCT BULB TIP NO VENT (SUCTIONS) IMPLANT

## 2017-10-26 NOTE — Anesthesia Preprocedure Evaluation (Addendum)
Anesthesia Evaluation  Patient identified by MRN, date of birth, ID band Patient awake    Reviewed: Allergy & Precautions, NPO status , Patient's Chart, lab work & pertinent test results  Airway Mallampati: III  TM Distance: >3 FB Neck ROM: Full    Dental  (+) Dental Advisory Given, Missing   Pulmonary neg pulmonary ROS,    Pulmonary exam normal breath sounds clear to auscultation       Cardiovascular hypertension, Pt. on medications + Peripheral Vascular Disease  Normal cardiovascular exam Rhythm:Regular Rate:Normal     Neuro/Psych PSYCHIATRIC DISORDERS Depression negative neurological ROS     GI/Hepatic Neg liver ROS, GERD  ,  Endo/Other  diabetes (Neuropathy), Type 2, Insulin Dependent  Renal/GU negative Renal ROS     Musculoskeletal negative musculoskeletal ROS (+)   Abdominal   Peds  Hematology  (+) Blood dyscrasia (Plavix), ,   Anesthesia Other Findings Day of surgery medications reviewed with the patient.  Reproductive/Obstetrics                           Anesthesia Physical Anesthesia Plan  ASA: III  Anesthesia Plan: MAC   Post-op Pain Management:    Induction: Intravenous  PONV Risk Score and Plan: 2 and Propofol infusion, Ondansetron and Midazolam  Airway Management Planned: Nasal Cannula  Additional Equipment:   Intra-op Plan:   Post-operative Plan:   Informed Consent: I have reviewed the patients History and Physical, chart, labs and discussed the procedure including the risks, benefits and alternatives for the proposed anesthesia with the patient or authorized representative who has indicated his/her understanding and acceptance.   Dental advisory given  Plan Discussed with: CRNA and Anesthesiologist  Anesthesia Plan Comments: (Discussed risks/benefits/alternatives to MAC sedation including need for ventilatory support, hypotension, need for conversion to  general anesthesia.  All patient questions answered.  Patient/guardian wishes to proceed.  MAC plus local by surgeon)        Anesthesia Quick Evaluation

## 2017-10-26 NOTE — Progress Notes (Signed)
Orthopedic Tech Progress Note Patient Details:  Katherine [name] [birth year] [id number]  Ortho Devices Type of Ortho Device: CAM walker       [name] 10/26/2017, 8:43 AM

## 2017-10-26 NOTE — Addendum Note (Signed)
Addendum  created 10/26/17 1511 by Myna Bright, CRNA   Intraprocedure Event edited

## 2017-10-26 NOTE — Discharge Instructions (Signed)
Weight bearing as tolerated in CAM boot. Keep Dressings clean, dry, and intact. F/u in office x 1 week.

## 2017-10-26 NOTE — Interval H&P Note (Signed)
History and Physical Interval Note:  10/26/2017 7:47 AM  Katherine [name]  has presented today for surgery, with the diagnosis of GANGRENE  The various methods of treatment have been discussed with the patient and family. After consideration of risks, benefits and other options for treatment, the patient has consented to  Procedure(s): AMPUTATION PARTIAL RAY LEFT FOOT AND IRRIGATION/DEBRIDEMENT (Left) as a surgical intervention .  The patient's history has been reviewed, patient examined, no change in status, stable for surgery.  I have reviewed the patient's chart and labs.  Questions were answered to the patient's satisfaction.     Edrick Kins

## 2017-10-26 NOTE — Transfer of Care (Signed)
Immediate Anesthesia Transfer of Care Note  Patient: Katherine [name]  Procedure(s) Performed: AMPUTATION PARTIAL RAY LEFT FOOT AND IRRIGATION/DEBRIDEMENT (Left )  Patient Location: PACU  Anesthesia Type:MAC  Level of Consciousness: awake, alert , oriented and patient cooperative  Airway & Oxygen Therapy: Patient Spontanous Breathing and Patient connected to nasal cannula oxygen  Post-op Assessment: Report given to RN, Post -op Vital signs reviewed and stable and Patient moving all extremities  Post vital signs: Reviewed and stable  Last Vitals:  Vitals:   10/26/17 0617 10/26/17 0847  BP: (!) 170/58   Pulse: 82 (P) 78  Resp: 18 (P) 18  Temp: 37 C (P) 36.4 C  SpO2: 100% (P) 96%    Last Pain:  Vitals:   10/26/17 0617  TempSrc: Oral      Patients Stated Pain Goal: 3 (47/82/95 6213)  Complications: No apparent anesthesia complications

## 2017-10-26 NOTE — Brief Op Note (Signed)
10/26/2017  8:52 AM  PATIENT:  Katherine [name]  61 y.o. female  PRE-OPERATIVE DIAGNOSIS:  GANGRENE  POST-OPERATIVE DIAGNOSIS:  GANGRENE  PROCEDURE:  Procedure(s) with comments: AMPUTATION PARTIAL RAY LEFT FOOT AND IRRIGATION/DEBRIDEMENT (Left) - AMPUTATION PARTIAL RAY LEFT FOOT AND IRRIGATION/DEBRIDEMENT  SURGEON:  Surgeon(s) and Role:    Edrick Kins, DPM - Primary  PHYSICIAN ASSISTANT:   ASSISTANTS: none   ANESTHESIA:   local  EBL:  50 mL   BLOOD ADMINISTERED:none  DRAINS: none   LOCAL MEDICATIONS USED:  MARCAINE    and LIDOCAINE   SPECIMEN:  No Specimen  DISPOSITION OF SPECIMEN:  N/A  COUNTS:  YES  TOURNIQUET:  * Missing tourniquet times found for documented tourniquets in log: [id number] *  DICTATION: .Dragon Dictation  PLAN OF CARE: Discharge to home after PACU  PATIENT DISPOSITION:  PACU - hemodynamically stable.   Delay start of Pharmacological VTE agent (>24hrs) due to surgical blood loss or risk of bleeding: not applicable  Edrick Kins, DPM [facility]  Dr. Edrick Kins, DPM    [address], [place] [address]                Office [phone]  Fax [phone]

## 2017-10-26 NOTE — Anesthesia Postprocedure Evaluation (Signed)
Anesthesia Post Note  Patient: Katherine [name]  Procedure(s) Performed: AMPUTATION PARTIAL RAY LEFT FOOT AND IRRIGATION/DEBRIDEMENT (Left )     Patient location during evaluation: PACU Anesthesia Type: MAC Level of consciousness: awake and alert Pain management: pain level controlled Vital Signs Assessment: post-procedure vital signs reviewed and stable Respiratory status: spontaneous breathing, nonlabored ventilation and respiratory function stable Cardiovascular status: stable and blood pressure returned to baseline Postop Assessment: no apparent nausea or vomiting Anesthetic complications: no    Last Vitals:  Vitals:   10/26/17 0933 10/26/17 0942  BP: (!) 158/67 (!) 164/67  Pulse: 70 70  Resp: 14 14  Temp: 36.9 C   SpO2: 96% 97%    Last Pain:  Vitals:   10/26/17 0942  TempSrc:   PainSc: 0-No pain                 Catalina Gravel

## 2017-10-27 ENCOUNTER — Encounter (HOSPITAL_COMMUNITY): Payer: Self-pay | Admitting: Podiatry

## 2017-10-29 ENCOUNTER — Telehealth: Payer: Self-pay | Admitting: Internal Medicine

## 2017-10-29 LAB — AEROBIC CULTURE  (SUPERFICIAL SPECIMEN): GRAM STAIN: NONE SEEN

## 2017-10-29 LAB — AEROBIC CULTURE W GRAM STAIN (SUPERFICIAL SPECIMEN)

## 2017-10-29 NOTE — Telephone Encounter (Signed)
PC placed to pt today after I received the results of surgical cx from LT big toe that was amputated. Cx results below. I left message on VM letting her know that I was calling to see how she is doing post-op and also to let her know that we need to change the antibiotics. I tried calling a second time today and got VM message that VM box is full.  Will try her again tomorrow. I have sent message to Dr. Amalia Hailey informing him of results and inquiring how long she would need treatment with Cipro.  Results for orders placed or performed during the hospital encounter of 10/26/17  Anaerobic culture  Result Value Ref Range   Specimen Description WOUND FOOT LEFT    Special Requests L GREAT TOE    Culture      NO ANAEROBES ISOLATED; CULTURE IN PROGRESS FOR 5 DAYS   Report Status PENDING   Aerobic Culture (superficial specimen)  Result Value Ref Range   Specimen Description WOUND FOOT LEFT    Special Requests L GREAT TOE    Gram Stain NO WBC SEEN NO ORGANISMS SEEN     Culture      RARE PSEUDOMONAS AERUGINOSA RARE PROVIDENCIA RETTGERI    Report Status 10/29/2017 FINAL    Organism ID, Bacteria PSEUDOMONAS AERUGINOSA    Organism ID, Bacteria PROVIDENCIA RETTGERI       Susceptibility   Pseudomonas aeruginosa - MIC*    CEFTAZIDIME 4 SENSITIVE Sensitive     CIPROFLOXACIN <=0.25 SENSITIVE Sensitive     GENTAMICIN <=1 SENSITIVE Sensitive     IMIPENEM 2 SENSITIVE Sensitive     PIP/TAZO 8 SENSITIVE Sensitive     CEFEPIME 2 SENSITIVE Sensitive     * RARE PSEUDOMONAS AERUGINOSA   Providencia rettgeri - MIC*    AMPICILLIN RESISTANT Resistant     CEFAZOLIN >=64 RESISTANT Resistant     CEFEPIME <=1 SENSITIVE Sensitive     CEFTAZIDIME <=1 SENSITIVE Sensitive     CEFTRIAXONE <=1 SENSITIVE Sensitive     CIPROFLOXACIN <=0.25 SENSITIVE Sensitive     GENTAMICIN <=1 SENSITIVE Sensitive     IMIPENEM 1 SENSITIVE Sensitive     TRIMETH/SULFA >=320 RESISTANT Resistant     AMPICILLIN/SULBACTAM <=2 SENSITIVE  Sensitive     PIP/TAZO <=4 SENSITIVE Sensitive     * RARE PROVIDENCIA RETTGERI  Glucose, capillary  Result Value Ref Range   Glucose-Capillary 227 (H) 65 - 99 mg/dL  Glucose, capillary  Result Value Ref Range   Glucose-Capillary 232 (H) 65 - 99 mg/dL   Comment 1 Notify RN    Comment 2 Document in Chart

## 2017-10-30 ENCOUNTER — Telehealth: Payer: Self-pay | Admitting: Internal Medicine

## 2017-10-30 MED ORDER — CIPROFLOXACIN HCL 500 MG PO TABS: 500.0000 mg | ORAL_TABLET | Freq: Two times a day (BID) | ORAL | 0 refills | Status: DC

## 2017-10-30 MED FILL — CIPROFLOXACIN HCL 500 MG TA: 500 | 10 days supply | Qty: 20 | Fill #0

## 2017-10-30 NOTE — Telephone Encounter (Signed)
Spoke with pt today. Pt informed that bacteria grown from amputated toe resistant to Bactrim. Recommend change to Cipro. Pt requested rxn sent to [facility] pharmacy.  She will pick up rxn tomorrow. Pt has appt with Dr. Amalia Hailey and Gwenlyn Found tomorrow. Pt advised to ask Dr. Amalia Hailey how long she would have to be on Cipro.

## 2017-10-31 ENCOUNTER — Ambulatory Visit: Payer: Self-pay

## 2017-10-31 ENCOUNTER — Encounter: Payer: Self-pay | Admitting: Cardiovascular Disease

## 2017-10-31 ENCOUNTER — Ambulatory Visit (INDEPENDENT_AMBULATORY_CARE_PROVIDER_SITE_OTHER): Payer: Self-pay | Admitting: Cardiovascular Disease

## 2017-10-31 ENCOUNTER — Ambulatory Visit (INDEPENDENT_AMBULATORY_CARE_PROVIDER_SITE_OTHER): Payer: No Typology Code available for payment source | Admitting: Podiatry

## 2017-10-31 DIAGNOSIS — I10 Essential (primary) hypertension: Secondary | ICD-10-CM

## 2017-10-31 DIAGNOSIS — E785 Hyperlipidemia, unspecified: Secondary | ICD-10-CM

## 2017-10-31 DIAGNOSIS — I70229 Atherosclerosis of native arteries of extremities with rest pain, unspecified extremity: Secondary | ICD-10-CM

## 2017-10-31 DIAGNOSIS — I998 Other disorder of circulatory system: Secondary | ICD-10-CM

## 2017-10-31 DIAGNOSIS — Z899 Acquired absence of limb, unspecified: Secondary | ICD-10-CM

## 2017-10-31 MED ORDER — ATORVASTATIN CALCIUM 80 MG PO TABS: 80.0000 mg | ORAL_TABLET | Freq: Every day | ORAL | 6 refills | Status: DC

## 2017-10-31 MED FILL — ATORVASTATIN 80 MG TABLET: 80 | 30 days supply | Qty: 30 | Fill #0

## 2017-10-31 NOTE — Progress Notes (Signed)
10/31/2017 Katherine [name]   October 28, 1956  [id number]  Primary Physician Ladell Pier, MD Primary Cardiologist: Lorretta Harp MD Lupe Carney, [place]  HPI:  Katherine [name] is a 61 y.o.  married African-American female with no children who worked as a Scientist, water quality up until several months ago. She was referred to me by Dr. Amalia Hailey for peripheral vascular evaluation because of critical limb ischemia. I last saw her in the office 09/28/17 . She does have a history of treated hypertension, diabetes and hyperlipidemia. She has never had a heart attack or stroke. She does have a gangrenous left great toe is an ulcer there for several months having undergone several rounds of antibiotic therapy. Her left toe is painful. She had recent venous Doppler study that did not show evidence of DVT. I perform peripheral angiography of her 09/10/17 revealing an occluded left anterior tibial posterior tibial artery with a patent peroneal. I was able to recanalize the anterior tibial down to the foot. Her hospital course was mildly prolonged because of unexplained anemia requiring transfusion. She did not have a retroperitoneal hematoma by CT. Her subsequent lower extremity arterial Doppler studies performed on 09/24/17 showed an increase in her left ABI from 0.67 up to 1.03. When I saw her last approximately 5 weeks ago she still had some edema with an unchanged ischemic ulcer. She also underwent left first toe partial ray amputation by Dr. Amalia Hailey on 10/26/17 and he is seeing her today for postop evaluation.   No outpatient medications have been marked as taking for the 10/31/17 encounter (Office Visit) with Lorretta Harp, MD.     Allergies  Allergen Reactions  . Other Swelling    Seaweed= swelling on arms, hands and face  . Metformin And Related Nausea Only    Social History   Socioeconomic History  . Marital status: Married    Spouse name: Not on file  . Number of children: Not on file  . Years of  education: Not on file  . Highest education level: Not on file  Social Needs  . Financial resource strain: Not on file  . Food insecurity - worry: Not on file  . Food insecurity - inability: Not on file  . Transportation needs - medical: Not on file  . Transportation needs - non-medical: Not on file  Occupational History  . Not on file  Tobacco Use  . Smoking status: Never Smoker  . Smokeless tobacco: Never Used  Substance and Sexual Activity  . Alcohol use: No  . Drug use: No  . Sexual activity: No  Other Topics Concern  . Not on file  Social History Narrative  . Not on file     Review of Systems: General: negative for chills, fever, night sweats or weight changes.  Cardiovascular: negative for chest pain, dyspnea on exertion, edema, orthopnea, palpitations, paroxysmal nocturnal dyspnea or shortness of breath Dermatological: negative for rash Respiratory: negative for cough or wheezing Urologic: negative for hematuria Abdominal: negative for nausea, vomiting, diarrhea, bright red blood per rectum, melena, or hematemesis Neurologic: negative for visual changes, syncope, or dizziness All other systems reviewed and are otherwise negative except as noted above.    Blood pressure 140/66, pulse 84, height 6' (1.829 m), weight 221 lb 12.8 oz (100.6 kg).  General appearance: alert and no distress Neck: no adenopathy, no carotid bruit, no JVD, supple, symmetrical, trachea midline and thyroid not enlarged, symmetric, no tenderness/mass/nodules Lungs: clear to auscultation bilaterally Heart: regular  rate and rhythm, S1, S2 normal, no murmur, click, rub or gallop Extremities: extremities normal, atraumatic, no cyanosis or edema Pulses: 2+ and symmetric Skin: Skin color, texture, turgor normal. No rashes or lesions Neurologic: Alert and oriented X 3, normal strength and tone. Normal symmetric reflexes. Normal coordination and gait  EKG not performed today  ASSESSMENT AND PLAN:    HYPERTENSION, BENIGN ESSENTIAL History of essential hypertension blood pressure measures 140/66. She is on amlodipine, hydralazine and hydrochlorothiazide as well as losartan. Continue current meds at current dosing.  Dyslipidemia History of dyslipidemia on statin therapy followed by her PCP  Critical lower limb ischemia History of critical limb ischemia status post peripheral angiography and left anterior tibial recanalization 09/10/17 for critical limb ischemia. She did have a gangrenous left great toe. Her left ABI increased from 0.67 up to 1.03. She also underwent left great toe partial ray amputation by Dr. Amalia Hailey 10/26/17 and is scheduled to see him back today in follow-up.      Lorretta Harp MD FACP,FACC,FAHA, [facility] 10/31/2017 11:47 AM

## 2017-10-31 NOTE — Assessment & Plan Note (Signed)
History of dyslipidemia on statin therapy followed by her PCP 

## 2017-10-31 NOTE — Addendum Note (Signed)
Addended by: Therisa Doyne on: 10/31/2017 12:12 PM   Modules accepted: Orders

## 2017-10-31 NOTE — Patient Instructions (Signed)
Medication Instructions: Your physician recommends that you continue on your current medications as directed. Please refer to the Current Medication list given to you today.   Follow-Up: Your physician recommends that you schedule a follow-up appointment in: 3 months with Dr. Berry.  If you need a refill on your cardiac medications before your next appointment, please call your pharmacy.  

## 2017-10-31 NOTE — Assessment & Plan Note (Signed)
History of essential hypertension blood pressure measures 140/66. She is on amlodipine, hydralazine and hydrochlorothiazide as well as losartan. Continue current meds at current dosing.

## 2017-10-31 NOTE — Op Note (Signed)
OPERATIVE REPORT Patient name: Katherine [name] MRN: [id number] DOB: February 07, 1956  DOS:  10/26/2017  Preop Dx: Gangrene left great toe.  Abscess left foot. Postop Dx: same  Procedure:  1.  Partial first ray amputation left foot 2.  Incision and drainage left foot  Surgeon: Edrick Kins DPM  Anesthesia: 50-50 mixture of 2% lidocaine plain with 0.5% Marcaine plain totaling 20 cc infiltrated in the patient's left lower extremity  Hemostasis: No tourniquet was utilized  EBL: 20 mL Materials: None Injectables: None Pathology: Deep wound culture sent to pathology for culture and sensitivity  Condition: The patient tolerated the procedure and anesthesia well. No complications noted or reported   Justification for procedure: The patient is a 61 y.o. female with a history of diabetes mellitus who presents today for surgical correction of gangrene to the left great toe. All conservative modalities of been unsuccessful in providing any sort of satisfactory alleviation of symptoms with the patient. The patient was told benefits as well as possible side effects of the surgery. The patient consented for surgical correction. The patient consent form was reviewed. All patient questions were answered. No guarantees were expressed or implied. The patient and the surgeon boson the patient consent form with the witness present and placed in the patient's chart.   Procedure in Detail: The patient was brought to the operating room, placed in the operating table in the supine position at which time an aseptic scrub and drape were performed about the patient's respective lower extremity after anesthesia was induced as described above. Attention was then directed to the surgical area where procedure number one commenced.  Procedure #1: Partial first ray amputation left foot  A racquet type incision was made around the first metatarsal phalangeal joint of the left foot.  The incision was carried down to the  level joint capsule with care taken to Clamp ligate to retract well small neurovascular structures traversing the incision site.  The great toe was grasped with a perforating towel clamp and all soft tissue and capsular structures around the first MPJ where sharply dissected and the toe was removed in toto and placed in a sterile specimen container. Any exposed tendons including the EHL and FHL of the surgical amputation site were distracted distally and cut as far proximal as could be visualized.  Intraoperative evaluation of the first metatarsal head warranted resection of the metatarsal head and sesamoidal apparatus to promote better wound closure.  An osteotomy was performed at the surgical phalangeal neck of the first metatarsal in a dorsal distal to proximal plantar orientation using a sagittal blade wound sagittal saw.  Soft tissue dissection was performed around the sesamoidal apparatus and the sesamoidal apparatus was removed in toto as well.  Procedure #2: Incision and drainage left foot  After surgical amputation of the partial first ray of the left foot soft tissue structures were examined.  Blunt soft tissue dissection was performed to perforating deep tissue underlying abscess.  All necrotic nonviable tissue was sharply dissected using a surgical 15 blade.  At this time deep wound cultures were taken and sent to pathology for cultures and sensitivities.  Copious irrigation was then utilized in preparation for routine layered soft tissue closure.  Superficial skin edges were reapproximated using 3-0 Prolene suture.  Dry sterile compressive dressings were then applied to all previously mentioned incision sites about the patient's lower extremity. The patient was then transferred from the operating room to the recovery room having tolerated the procedure and anesthesia  well. All vital signs are stable. After a brief stay in the recovery room the patient was discharged with adequate prescriptions  for analgesia. Verbal as well as written instructions were provided for the patient regarding wound care. The patient is to keep the dressings clean dry and intact until they are to follow surgeon Dr. Daylene Katayama in the office upon discharge.   Edrick Kins, DPM [facility]  Dr. Edrick Kins, [address]                                        [place], [place] [address]                Office [phone]  Fax [phone]

## 2017-10-31 NOTE — Assessment & Plan Note (Signed)
History of critical limb ischemia status post peripheral angiography and left anterior tibial recanalization 09/10/17 for critical limb ischemia. She did have a gangrenous left great toe. Her left ABI increased from 0.67 up to 1.03. She also underwent left great toe partial ray amputation by Dr. Amalia Hailey 10/26/17 and is scheduled to see him back today in follow-up.

## 2017-11-01 LAB — SPECIMEN STATUS REPORT

## 2017-11-01 LAB — HGB A1C W/O EAG: HEMOGLOBIN A1C: 8.3 % — AB (ref 4.8–5.6)

## 2017-11-02 MED FILL — GABAPENTIN 300 MG CAPSULE: 300 | 30 days supply | Qty: 180 | Fill #2

## 2017-11-03 LAB — ANAEROBIC CULTURE

## 2017-11-04 NOTE — Progress Notes (Signed)
   Subjective:  Patient presents today status post partial first ray amputation of the left lower extremity. DOS: 10/26/17.  She reports continued significant pain.  She has been wearing the cam boot but states it is very heavy for her.  There are no modifying factors noted.  Patient is here for further evaluation and treatment.   Past Medical History:  Diagnosis Date  . Anemia    requiring transfusion  . Boil of buttock ~ 2016  . Depression   . Diabetic neuropathy ([place])    Archie Endo 08/23/2017  . DKA (diabetic ketoacidoses) ([place])    recent/notes 01/06/2015  . Hyperlipidemia   . Hypertension   . Neuropathy   . Peripheral vascular disease ([place])   . Toe ulcer ([place])    left great toe/notes 08/23/2017  . Type II diabetes mellitus ([facility])       Objective/Physical Exam Skin incisions appear to be well coapted with sutures and staples intact. No sign of infectious process noted. No dehiscence. No active bleeding noted. Moderate edema noted to the surgical extremity.  Radiographic Exam:  Osteotomy site appear to be stable with routine healing.  No cortical erosions noted to the osteotomy site of the first metatarsal.  Assessment: 1. s/p partial first ray amputation left lower extremity. DOS: 10/26/17.   Plan of Care:  1.  Patient was evaluated. X-rays reviewed 2.  Dressing changed. 3.  Continue weightbearing in Cam boot. 4.  Continue taking oral antibiotics as directed by PCP. 5.  Return to clinic in 1 week.   Edrick Kins, DPM [facility]  Dr. Edrick Kins, [address]                                        [place], [place] [address]                Office [phone]  Fax [phone]

## 2017-11-05 MED FILL — LOSARTAN POTASSIUM 50 MG TA: 50 | 30 days supply | Qty: 30 | Fill #3

## 2017-11-05 MED FILL — !HUMULIN N 100 UNITS/ML KWI: 100 | 16 days supply | Qty: 30 | Fill #0

## 2017-11-07 ENCOUNTER — Ambulatory Visit (INDEPENDENT_AMBULATORY_CARE_PROVIDER_SITE_OTHER): Payer: No Typology Code available for payment source | Admitting: Podiatry

## 2017-11-07 DIAGNOSIS — I70245 Atherosclerosis of native arteries of left leg with ulceration of other part of foot: Secondary | ICD-10-CM

## 2017-11-07 DIAGNOSIS — E0842 Diabetes mellitus due to underlying condition with diabetic polyneuropathy: Secondary | ICD-10-CM

## 2017-11-07 DIAGNOSIS — Z899 Acquired absence of limb, unspecified: Secondary | ICD-10-CM

## 2017-11-07 DIAGNOSIS — E1351 Other specified diabetes mellitus with diabetic peripheral angiopathy without gangrene: Secondary | ICD-10-CM

## 2017-11-14 ENCOUNTER — Other Ambulatory Visit: Payer: Self-pay | Admitting: Internal Medicine

## 2017-11-14 ENCOUNTER — Ambulatory Visit (INDEPENDENT_AMBULATORY_CARE_PROVIDER_SITE_OTHER): Payer: No Typology Code available for payment source | Admitting: Podiatry

## 2017-11-14 ENCOUNTER — Ambulatory Visit: Payer: No Typology Code available for payment source

## 2017-11-14 DIAGNOSIS — Z899 Acquired absence of limb, unspecified: Secondary | ICD-10-CM

## 2017-11-14 DIAGNOSIS — M86679 Other chronic osteomyelitis, unspecified ankle and foot: Secondary | ICD-10-CM

## 2017-11-14 NOTE — Progress Notes (Signed)
   Subjective:  Patient presents today status post partial first ray amputation of the left lower extremity. DOS: 10/26/17.  She states she is doing well overall.  She denies any new complaints at this time.  Patient is here for further evaluation and treatment.   Past Medical History:  Diagnosis Date  . Anemia    requiring transfusion  . Boil of buttock ~ 2016  . Depression   . Diabetic neuropathy ([place])    Archie Endo 08/23/2017  . DKA (diabetic ketoacidoses) ([place])    recent/notes 01/06/2015  . Hyperlipidemia   . Hypertension   . Neuropathy   . Peripheral vascular disease ([place])   . Toe ulcer ([place])    left great toe/notes 08/23/2017  . Type II diabetes mellitus ([facility])       Objective/Physical Exam Skin incisions appear to be well coapted with sutures and staples intact. No sign of infectious process noted. No dehiscence. No active bleeding noted. Improved edema noted to the surgical extremity.   Assessment: 1. s/p partial first ray amputation left lower extremity. DOS: 10/26/17.   Plan of Care:  1.  Patient was evaluated.  2.  Dressing changed.  Keep clean, dry and intact for 1 week. 3.  Continue weightbearing in Cam boot or postop shoe. 4.  Return to clinic in 1 week.   Edrick Kins, DPM [facility]  Dr. Edrick Kins, [address]                                        [place], [place] [address]                Office [phone]  Fax [phone]

## 2017-11-21 NOTE — Progress Notes (Signed)
   Subjective:  Patient presents today status post partial first ray amputation of the left lower extremity. DOS: 10/26/17.  She states she is doing better.  She reports the pain is tolerable, but it is worse at night.  She states the leg feels weak but is getting better.  Patient is here for further evaluation and treatment.   Past Medical History:  Diagnosis Date  . Anemia    requiring transfusion  . Boil of buttock ~ 2016  . Depression   . Diabetic neuropathy ([place])    Archie Endo 08/23/2017  . DKA (diabetic ketoacidoses) ([place])    recent/notes 01/06/2015  . Hyperlipidemia   . Hypertension   . Neuropathy   . Peripheral vascular disease ([place])   . Toe ulcer ([place])    left great toe/notes 08/23/2017  . Type II diabetes mellitus (H[facility])       Objective/Physical Exam Skin incisions appear to be well coapted with sutures and staples intact. No sign of infectious process noted. No dehiscence. No active bleeding noted. Improved edema noted to the surgical extremity.   Radiographic Exam:  Osteotomies sites appear to be stable with routine healing.   Assessment: 1. s/p partial first ray amputation left lower extremity. DOS: 10/26/17.   Plan of Care:  1.  Patient was evaluated.  X-rays reviewed. 2.  Sutures removed.  Dry sterile dressing applied. 3.  Continue weightbearing in postop shoe.  Discontinue wearing cam boot. 4.  Return to clinic in 3 weeks.   Edrick Kins, DPM [facility]  Dr. Edrick Kins, [address]                                        [place], [place] [address]                Office [phone]  Fax [phone]

## 2017-11-30 MED FILL — ATORVASTATIN 80 MG TABLET: 80 | 30 days supply | Qty: 30 | Fill #1

## 2017-11-30 MED FILL — ?CLOPIDOGREL 75MG TABLET: 75 | 30 days supply | Qty: 30 | Fill #2

## 2017-12-03 ENCOUNTER — Ambulatory Visit: Payer: Self-pay | Attending: Internal Medicine | Admitting: Internal Medicine

## 2017-12-03 ENCOUNTER — Encounter: Payer: Self-pay | Admitting: Internal Medicine

## 2017-12-03 VITALS — BP 160/71 | HR 79 | Temp 98.0°F | Resp 16 | Wt 220.8 lb

## 2017-12-03 DIAGNOSIS — Z888 Allergy status to other drugs, medicaments and biological substances status: Secondary | ICD-10-CM | POA: Insufficient documentation

## 2017-12-03 DIAGNOSIS — E1165 Type 2 diabetes mellitus with hyperglycemia: Secondary | ICD-10-CM

## 2017-12-03 DIAGNOSIS — Z794 Long term (current) use of insulin: Secondary | ICD-10-CM | POA: Insufficient documentation

## 2017-12-03 DIAGNOSIS — E118 Type 2 diabetes mellitus with unspecified complications: Secondary | ICD-10-CM

## 2017-12-03 DIAGNOSIS — E1140 Type 2 diabetes mellitus with diabetic neuropathy, unspecified: Secondary | ICD-10-CM | POA: Insufficient documentation

## 2017-12-03 DIAGNOSIS — Z79899 Other long term (current) drug therapy: Secondary | ICD-10-CM | POA: Insufficient documentation

## 2017-12-03 DIAGNOSIS — D509 Iron deficiency anemia, unspecified: Secondary | ICD-10-CM | POA: Insufficient documentation

## 2017-12-03 DIAGNOSIS — F329 Major depressive disorder, single episode, unspecified: Secondary | ICD-10-CM | POA: Insufficient documentation

## 2017-12-03 DIAGNOSIS — Z841 Family history of disorders of kidney and ureter: Secondary | ICD-10-CM | POA: Insufficient documentation

## 2017-12-03 DIAGNOSIS — Z9889 Other specified postprocedural states: Secondary | ICD-10-CM | POA: Insufficient documentation

## 2017-12-03 DIAGNOSIS — Z833 Family history of diabetes mellitus: Secondary | ICD-10-CM | POA: Insufficient documentation

## 2017-12-03 DIAGNOSIS — K219 Gastro-esophageal reflux disease without esophagitis: Secondary | ICD-10-CM | POA: Insufficient documentation

## 2017-12-03 DIAGNOSIS — I10 Essential (primary) hypertension: Secondary | ICD-10-CM | POA: Insufficient documentation

## 2017-12-03 DIAGNOSIS — E785 Hyperlipidemia, unspecified: Secondary | ICD-10-CM | POA: Insufficient documentation

## 2017-12-03 DIAGNOSIS — Z1239 Encounter for other screening for malignant neoplasm of breast: Secondary | ICD-10-CM

## 2017-12-03 DIAGNOSIS — Z1231 Encounter for screening mammogram for malignant neoplasm of breast: Secondary | ICD-10-CM

## 2017-12-03 DIAGNOSIS — Z8249 Family history of ischemic heart disease and other diseases of the circulatory system: Secondary | ICD-10-CM | POA: Insufficient documentation

## 2017-12-03 DIAGNOSIS — Z9049 Acquired absence of other specified parts of digestive tract: Secondary | ICD-10-CM | POA: Insufficient documentation

## 2017-12-03 DIAGNOSIS — Z8631 Personal history of diabetic foot ulcer: Secondary | ICD-10-CM | POA: Insufficient documentation

## 2017-12-03 DIAGNOSIS — IMO0002 Reserved for concepts with insufficient information to code with codable children: Secondary | ICD-10-CM

## 2017-12-03 DIAGNOSIS — E11319 Type 2 diabetes mellitus with unspecified diabetic retinopathy without macular edema: Secondary | ICD-10-CM | POA: Insufficient documentation

## 2017-12-03 DIAGNOSIS — Z89412 Acquired absence of left great toe: Secondary | ICD-10-CM | POA: Insufficient documentation

## 2017-12-03 LAB — GLUCOSE, POCT (MANUAL RESULT ENTRY): POC Glucose: 287 mg/dl — AB (ref 70–99)

## 2017-12-03 MED ORDER — POTASSIUM CHLORIDE ER 10 MEQ PO TBCR: 10.0000 meq | EXTENDED_RELEASE_TABLET | Freq: Every day | ORAL | 6 refills | Status: DC

## 2017-12-03 MED FILL — LOSARTAN POTASSIUM 50 MG TA: 50 | 30 days supply | Qty: 30 | Fill #4

## 2017-12-03 MED FILL — HYDROCHLOROTHIAZIDE 25 MG T: 25 | 30 days supply | Qty: 30 | Fill #2

## 2017-12-03 NOTE — Progress Notes (Signed)
Pt is needing refills for clopidogrel and hydrochlorothiazide

## 2017-12-03 NOTE — Progress Notes (Signed)
Patient ID: Katherine [name], female    DOB: 03-10-1956  MRN: [id number]  CC: Follow-up (1 month)   Subjective: Katherine [name] is a 62 y.o. female who presents for chronic ds management. Last seen 10/2017 Her concerns today include:  Hx of HTN, DM with neuropathy, retinopathyand possible gastroparesis, HL, depression, GERD, PAD.   1.  LT foot: had amputation LT big toe since last visit.  Surgical wound healing well; followed by Dr. Amalia Hailey  -Dr. Amalia Hailey changes dressing Q wk.  She is still having a hard time dealing with the loss of the digit.  "I don't look at it." -+ swelling both lower legs x past 3 days.  Thinks she over did it walking around for about 1 hr in the mall over the wkend -No PND or orthopnea, CP  2.  HTN: compliant with Norvasc, HCTZ and Cozaar.  She stopped taking the latter because she was told it was taken off the market.  Out of HCTZ x 2-3 days. Endorses LE edema  Needs RF on Potassium Last checked BP about 1 mth ago  3.  DM: -sees Dr. Loanne Drilling tomorrow. Suppose to be on Humulin N 180 units daily.  However, pt states she is still taking "the gray pen"  90 units BID which she thinks is Lantus A.m BS 180-190, evening BS 120s  4. Anemia: taking iron BID  Patient Active Problem List   Diagnosis Date Noted  . Thyroid nodule 10/04/2017  . Microcytic anemia 09/11/2017  . Critical lower limb ischemia 09/10/2017  . Ulcer of left foot ([place]) 08/24/2017  . Retinopathy 08/22/2017  . Thyroid enlargement 07/12/2017  . Dyslipidemia 09/20/2016  . DEPRESSION 09/10/2009  . LEG CRAMPS 05/19/2009  . GERD 02/15/2009  . Gastroparesis 09/03/2008  . ONYCHOMYCOSIS 07/14/2008  . DENTAL CARIES 07/14/2008  . Diabetes mellitus type 2 with complications, uncontrolled ([place]) 09/16/2003  . HYPERTENSION, BENIGN ESSENTIAL 09/16/2003     Current Outpatient Medications on File Prior to Visit  Medication Sig Dispense Refill  . oxyCODONE-acetaminophen (ROXICET) 5-325 MG tablet Take 1 tablet by  mouth every 6 (six) hours as needed. 30 tablet 0  . amLODipine (NORVASC) 10 MG tablet Take 1 tablet (10 mg total) by mouth daily. 90 tablet 3  . atorvastatin (LIPITOR) 80 MG tablet Take 1 tablet (80 mg total) by mouth daily at 6 PM. 30 tablet 6  . Blood Glucose Monitoring Suppl (TRUE METRIX METER) w/Device KIT Use as directed 1 kit 0  . clopidogrel (PLAVIX) 75 MG tablet Take 1 tablet (75 mg total) by mouth daily with breakfast. 90 tablet 0  . docusate sodium (COLACE) 100 MG capsule Take 1 capsule (100 mg total) by mouth daily. (Patient taking differently: Take 100 mg by mouth daily as needed for mild constipation. ) 30 capsule 4  . ferrous sulfate 325 (65 FE) MG tablet Take 1 tablet (325 mg total) by mouth 2 (two) times daily with a meal. 60 tablet 0  . gabapentin (NEURONTIN) 300 MG capsule Take 2 capsules (600 mg total) by mouth 3 (three) times daily. 180 capsule 5  . glucose blood (TRUE METRIX BLOOD GLUCOSE TEST) test strip Use as instructed 100 each 12  . hydrALAZINE (APRESOLINE) 100 MG tablet Take 1 tablet (100 mg total) by mouth 3 (three) times daily. 90 tablet 2  . hydrochlorothiazide (HYDRODIURIL) 25 MG tablet Take 1 tablet (25 mg total) by mouth daily. 30 tablet 6  . Insulin NPH, Human,, Isophane, (HUMULIN N KWIKPEN) 100 UNIT/ML  Kiwkpen Inject 180 Units every morning into the skin. And pen needles 1/day 60 mL 11  . Insulin Pen Needle (ULTICARE MICRO PEN NEEDLES) 32G X 4 MM MISC Use as directed 100 each 11  . losartan (COZAAR) 50 MG tablet Take 1 tablet (50 mg total) by mouth daily. 30 tablet 6  . Multiple Vitamins-Minerals (MULTIVITAMIN ADULT) CHEW Chew 1 tablet by mouth daily.    . ondansetron (ZOFRAN) 4 MG tablet Take 1 tablet (4 mg total) by mouth every 8 (eight) hours as needed for nausea or vomiting. 20 tablet 0  . sertraline (ZOLOFT) 50 MG tablet 1/2 tab daily x 2 wk then 1 tab PO daily 30 tablet 3  . traZODone (DESYREL) 50 MG tablet Take 0.5-1 tablets (25-50 mg total) by mouth at  bedtime as needed for sleep. (Patient not taking: Reported on 12/03/2017) 30 tablet 3  . TRUEPLUS LANCETS 28G MISC Use as directed 100 each 12  . [DISCONTINUED] sitaGLIPtin (JANUVIA) 100 MG tablet Take 100 mg by mouth daily.     No current facility-administered medications on file prior to visit.     Allergies  Allergen Reactions  . Other Swelling    Seaweed= swelling on arms, hands and face  . Metformin And Related Nausea Only    Social History   Socioeconomic History  . Marital status: Married    Spouse name: Not on file  . Number of children: Not on file  . Years of education: Not on file  . Highest education level: Not on file  Social Needs  . Financial resource strain: Not on file  . Food insecurity - worry: Not on file  . Food insecurity - inability: Not on file  . Transportation needs - medical: Not on file  . Transportation needs - non-medical: Not on file  Occupational History  . Not on file  Tobacco Use  . Smoking status: Never Smoker  . Smokeless tobacco: Never Used  Substance and Sexual Activity  . Alcohol use: No  . Drug use: No  . Sexual activity: No  Other Topics Concern  . Not on file  Social History Narrative  . Not on file    Family History  Problem Relation Age of Onset  . Diabetes Father   . Hypertension Father   . Kidney disease Mother   . Hypertension Mother   . Cancer Maternal Grandmother   . Hypertension Maternal Grandfather   . Diabetes Paternal Aunt   . Diabetes Paternal Uncle   . Diabetes Paternal Grandfather   . Colon cancer Neg Hx     Past Surgical History:  Procedure Laterality Date  . AMPUTATION TOE Left 10/26/2017   Procedure: AMPUTATION PARTIAL RAY LEFT FOOT AND IRRIGATION/DEBRIDEMENT;  Surgeon: Edrick Kins, DPM;  Location: [facility];  Service: Podiatry;  Laterality: Left;  AMPUTATION PARTIAL RAY LEFT FOOT AND IRRIGATION/DEBRIDEMENT  . CHOLECYSTECTOMY  01/24/2003   Archie Endo 04/04/2011  . COLONOSCOPY    . INCISION AND DRAINAGE  ABSCESS  03/17/2012   "right but; left lower abdoment"/notes 03/17/2012  . LOWER EXTREMITY INTERVENTION N/A 09/10/2017   Procedure: LOWER EXTREMITY INTERVENTION;  Surgeon: Lorretta Harp, MD;  Location: [facility];  Service: Cardiovascular;  Laterality: N/A;  . PERIPHERAL VASCULAR ATHERECTOMY  09/10/2017   Procedure: PERIPHERAL VASCULAR ATHERECTOMY;  Surgeon: Lorretta Harp, MD;  Location: [facility];  Service: Cardiovascular;;  left AT    ROS: Review of Systems Neg except as above PHYSICAL EXAM: BP (!) 160/71  Pulse 79   Temp 98 F (36.7 C) (Oral)   Resp 16   Wt 220 lb 12.8 oz (100.2 kg)   SpO2 98%   BMI 29.95 kg/m   Physical Exam General appearance - alert, well appearing, and in no distress Mental status - alert, oriented to person, place, and time, normal mood, behavior, speech, dress, motor activity, and thought processes Chest - clear to auscultation, no wheezes, rales or rhonchi, symmetric air entry Heart - normal rate, regular rhythm, normal S1, S2, no murmurs, rubs, clicks or gallops Extremities - tract to 1 + BL LE edema  Lab Results  Component Value Date   HGBA1C 8.3 (H) 10/23/2017   Lab Results  Component Value Date   WBC 6.9 10/23/2017   HGB 11.5 10/23/2017   HCT 36.2 10/23/2017   MCV 84 10/23/2017   PLT 289 10/23/2017   BS 287  ASSESSMENT AND PLAN: 1. Essential hypertension Not at goal. LE edema due to being out of HCTZ which she will get filled today. There was a recall on Cozaar from the particular manufacturer.  However the manufacturer that our pharmacy uses was not on the recall list.  She will continue the Cozaar - potassium chloride (K-DUR) 10 MEQ tablet; Take 1 tablet (10 mEq total) by mouth daily.  Dispense: 30 tablet; Refill: 6  2. Diabetes mellitus type 2 with complications, uncontrolled ([place] -Need clarification as to which insulin she is taking the Lantus or Humulin N She sees Dr. Loanne Drilling tomorrow.  I advised that she  take 1 of the insulin pens with her to show him which she is taking.  She reports taking 90 units twice a day.  Until she sees him I recommended change to 90 in the morning and 94 in the evening. - POCT glucose (manual entry)  3. Iron deficiency anemia, unspecified iron deficiency anemia type Continue iron - Hematocrit  4. Breast cancer screening - MM Digital Screening; Future   Patient was given the opportunity to ask questions.  Patient verbalized understanding of the plan and was able to repeat key elements of the plan.   Orders Placed This Encounter  Procedures  . MM Digital Screening  . Hematocrit  . POCT glucose (manual entry)     Requested Prescriptions   Signed Prescriptions Disp Refills  . potassium chloride (K-DUR) 10 MEQ tablet 30 tablet 6    Sig: Take 1 tablet (10 mEq total) by mouth daily.    Return in about 2 weeks (around 12/17/2017) for PAP.  Karle Plumber, MD, FACP

## 2017-12-03 NOTE — Patient Instructions (Signed)
Change Lantus to 90 units in a.m and 94 units in p.m. Keep appointment with Dr. Loanne Drilling tomorrow.

## 2017-12-04 ENCOUNTER — Ambulatory Visit (INDEPENDENT_AMBULATORY_CARE_PROVIDER_SITE_OTHER): Payer: Self-pay | Admitting: Endocrinology

## 2017-12-04 ENCOUNTER — Encounter: Payer: Self-pay | Admitting: Endocrinology

## 2017-12-04 VITALS — BP 152/70 | HR 80 | Wt 223.8 lb

## 2017-12-04 DIAGNOSIS — E1165 Type 2 diabetes mellitus with hyperglycemia: Secondary | ICD-10-CM

## 2017-12-04 DIAGNOSIS — IMO0002 Reserved for concepts with insufficient information to code with codable children: Secondary | ICD-10-CM

## 2017-12-04 DIAGNOSIS — E118 Type 2 diabetes mellitus with unspecified complications: Secondary | ICD-10-CM

## 2017-12-04 DIAGNOSIS — E049 Nontoxic goiter, unspecified: Secondary | ICD-10-CM

## 2017-12-04 LAB — POCT GLYCOSYLATED HEMOGLOBIN (HGB A1C): Hemoglobin A1C: 8.4

## 2017-12-04 LAB — HEMATOCRIT: HEMATOCRIT: 40.2 % (ref 34.0–46.6)

## 2017-12-04 NOTE — Patient Instructions (Addendum)
check your blood sugar twice a day.  vary the time of day when you check, between before the 3 meals, and at bedtime.  also check if you have symptoms of your blood sugar being too high or too low.  please keep a record of the readings and bring it to your next appointment here (or you can bring the meter itself).  You can write it on any piece of paper.  please call us sooner if your blood sugar goes below 70, or if you have a lot of readings over 200.   Please start taking the NPH insulin, 180 units each morning.  Let's check the ultrasound.  you will receive a phone call, about a day and time for an appointment.   Please come back for a follow-up appointment in 2 months.

## 2017-12-04 NOTE — Progress Notes (Signed)
Results for orders placed or performed in visit on 12/03/17  Hematocrit  Result Value Ref Range   Hematocrit 40.2 34.0 - 46.6 %  POCT glucose (manual entry)  Result Value Ref Range   POC Glucose 287 (A) 70 - 99 mg/dl   Anemia has corrected.  Will have her d/c iron.

## 2017-12-04 NOTE — Addendum Note (Signed)
Addended by: Karle Plumber B on: 12/04/2017 08:04 AM   Modules accepted: Orders

## 2017-12-04 NOTE — Progress Notes (Signed)
Subjective:    Patient ID: Katherine [name], female    DOB: 1956-08-19, 62 y.o.   MRN: [id number]  HPI Pt returns for f/u of diabetes mellitus:  DM type: 1 Dx'ed: 4098 Complications: polyneuropathy, gastroparesis, foot ulcer, renal insuff, and DR.  Therapy: insulin since 2014 GDM: never DKA: once (2016) Severe hypoglycemia: never.   Pancreatitis: never Pancreatic imaging: normal on 2016 CT.   Other: she gets insulin through [facility]; she declines multiple daily injections; she did not tolerate levemir (rash); she was changed to NPH qam, due to the pattern of her cbg's.   Interval history: She has obtained the NPH insulin, but she has not started taking.  no cbg record, but states cbg's vary widely.   Past Medical History:  Diagnosis Date  . Anemia    requiring transfusion  . Boil of buttock ~ 2016  . Depression   . Diabetic neuropathy ([place])    Archie Endo 08/23/2017  . DKA (diabetic ketoacidoses) ([place])    recent/notes 01/06/2015  . Hyperlipidemia   . Hypertension   . Neuropathy   . Peripheral vascular disease ([place])   . Toe ulcer ([place])    left great toe/notes 08/23/2017  . Type II diabetes mellitus ([place])     Past Surgical History:  Procedure Laterality Date  . AMPUTATION TOE Left 10/26/2017   Procedure: AMPUTATION PARTIAL RAY LEFT FOOT AND IRRIGATION/DEBRIDEMENT;  Surgeon: Edrick Kins, DPM;  Location: [facility];  Service: Podiatry;  Laterality: Left;  AMPUTATION PARTIAL RAY LEFT FOOT AND IRRIGATION/DEBRIDEMENT  . CHOLECYSTECTOMY  01/24/2003   Archie Endo 04/04/2011  . COLONOSCOPY    . INCISION AND DRAINAGE ABSCESS  03/17/2012   "right but; left lower abdoment"/notes 03/17/2012  . LOWER EXTREMITY INTERVENTION N/A 09/10/2017   Procedure: LOWER EXTREMITY INTERVENTION;  Surgeon: Lorretta Harp, MD;  Location: [facility];  Service: Cardiovascular;  Laterality: N/A;  . PERIPHERAL VASCULAR ATHERECTOMY  09/10/2017   Procedure: PERIPHERAL VASCULAR ATHERECTOMY;  Surgeon: Lorretta Harp,  MD;  Location: [facility];  Service: Cardiovascular;;  left AT    Social History   Socioeconomic History  . Marital status: Married    Spouse name: Not on file  . Number of children: Not on file  . Years of education: Not on file  . Highest education level: Not on file  Social Needs  . Financial resource strain: Not on file  . Food insecurity - worry: Not on file  . Food insecurity - inability: Not on file  . Transportation needs - medical: Not on file  . Transportation needs - non-medical: Not on file  Occupational History  . Not on file  Tobacco Use  . Smoking status: Never Smoker  . Smokeless tobacco: Never Used  Substance and Sexual Activity  . Alcohol use: No  . Drug use: No  . Sexual activity: No  Other Topics Concern  . Not on file  Social History Narrative  . Not on file    Current Outpatient Medications on File Prior to Visit  Medication Sig Dispense Refill  . amLODipine (NORVASC) 10 MG tablet Take 1 tablet (10 mg total) by mouth daily. 90 tablet 3  . atorvastatin (LIPITOR) 80 MG tablet Take 1 tablet (80 mg total) by mouth daily at 6 PM. 30 tablet 6  . Blood Glucose Monitoring Suppl (TRUE METRIX METER) w/Device KIT Use as directed 1 kit 0  . clopidogrel (PLAVIX) 75 MG tablet Take 1 tablet (75 mg total) by mouth daily  with breakfast. 90 tablet 0  . docusate sodium (COLACE) 100 MG capsule Take 1 capsule (100 mg total) by mouth daily. (Patient taking differently: Take 100 mg by mouth daily as needed for mild constipation. ) 30 capsule 4  . gabapentin (NEURONTIN) 300 MG capsule Take 2 capsules (600 mg total) by mouth 3 (three) times daily. 180 capsule 5  . glucose blood (TRUE METRIX BLOOD GLUCOSE TEST) test strip Use as instructed 100 each 12  . hydrALAZINE (APRESOLINE) 100 MG tablet Take 1 tablet (100 mg total) by mouth 3 (three) times daily. 90 tablet 2  . hydrochlorothiazide (HYDRODIURIL) 25 MG tablet Take 1 tablet (25 mg total) by mouth daily. 30 tablet 6    . Insulin NPH, Human,, Isophane, (HUMULIN N KWIKPEN) 100 UNIT/ML Kiwkpen Inject 180 Units every morning into the skin. And pen needles 1/day 60 mL 11  . Insulin Pen Needle (ULTICARE MICRO PEN NEEDLES) 32G X 4 MM MISC Use as directed 100 each 11  . losartan (COZAAR) 50 MG tablet Take 1 tablet (50 mg total) by mouth daily. 30 tablet 6  . Multiple Vitamins-Minerals (MULTIVITAMIN ADULT) CHEW Chew 1 tablet by mouth daily.    . ondansetron (ZOFRAN) 4 MG tablet Take 1 tablet (4 mg total) by mouth every 8 (eight) hours as needed for nausea or vomiting. 20 tablet 0  . oxyCODONE-acetaminophen (ROXICET) 5-325 MG tablet Take 1 tablet by mouth every 6 (six) hours as needed. 30 tablet 0  . potassium chloride (K-DUR) 10 MEQ tablet Take 1 tablet (10 mEq total) by mouth daily. 30 tablet 6  . sertraline (ZOLOFT) 50 MG tablet 1/2 tab daily x 2 wk then 1 tab PO daily 30 tablet 3  . traZODone (DESYREL) 50 MG tablet Take 0.5-1 tablets (25-50 mg total) by mouth at bedtime as needed for sleep. 30 tablet 3  . TRUEPLUS LANCETS 28G MISC Use as directed 100 each 12  . [DISCONTINUED] sitaGLIPtin (JANUVIA) 100 MG tablet Take 100 mg by mouth daily.     No current facility-administered medications on file prior to visit.     Allergies  Allergen Reactions  . Other Swelling    Seaweed= swelling on arms, hands and face  . Metformin And Related Nausea Only    Family History  Problem Relation Age of Onset  . Diabetes Father   . Hypertension Father   . Kidney disease Mother   . Hypertension Mother   . Cancer Maternal Grandmother   . Hypertension Maternal Grandfather   . Diabetes Paternal Aunt   . Diabetes Paternal Uncle   . Diabetes Paternal Grandfather   . Colon cancer Neg Hx     BP (!) 152/70 (BP Location: Left Arm, Patient Position: Sitting, Cuff Size: Normal)   Pulse 80   Wt 223 lb 12.8 oz (101.5 kg)   SpO2 97%   BMI 30.35 kg/m   Review of Systems She denies hypoglycemia.      Objective:   Physical  Exam VITAL SIGNS:  See vs page GENERAL: no distress Foot exam is declined today.    Lab Results  Component Value Date   HGBA1C 8.3 (H) 10/23/2017   Lab Results  Component Value Date   CREATININE 1.10 (H) 10/23/2017   BUN 13 10/23/2017   NA 142 10/23/2017   K 4.1 10/23/2017   CL 97 10/23/2017   CO2 29 10/23/2017      Assessment & Plan:  Insulin-requiring type 2 DM: therapy limited by noncompliance with insulin and cbg's.  Patient Instructions  check your blood sugar twice a day.  vary the time of day when you check, between before the 3 meals, and at bedtime.  also check if you have symptoms of your blood sugar being too high or too low.  please keep a record of the readings and bring it to your next appointment here (or you can bring the meter itself).  You can write it on any piece of paper.  please call us sooner if your blood sugar goes below 70, or if you have a lot of readings over 200.   Please start taking the NPH insulin, 180 units each morning.  Let's check the ultrasound.  you will receive a phone call, about a day and time for an appointment.   Please come back for a follow-up appointment in 2 months.

## 2017-12-05 ENCOUNTER — Ambulatory Visit: Payer: No Typology Code available for payment source

## 2017-12-05 ENCOUNTER — Telehealth: Payer: Self-pay

## 2017-12-05 ENCOUNTER — Ambulatory Visit (INDEPENDENT_AMBULATORY_CARE_PROVIDER_SITE_OTHER): Payer: Self-pay | Admitting: Podiatry

## 2017-12-05 DIAGNOSIS — Z899 Acquired absence of limb, unspecified: Secondary | ICD-10-CM

## 2017-12-05 NOTE — Telephone Encounter (Signed)
Contacted pt to go over lab results pt didn't answer left detailed message regarding labs and if she has any questions or concerns to give me a call  If calls back please give results: anemia has corrected. She can stop iron.

## 2017-12-09 NOTE — Progress Notes (Signed)
   Subjective:  Patient presents today status post partial first ray amputation of the left lower extremity. DOS: 10/26/17.  She states she is doing well overall. She reports some continued pain to the dorsum of the left foot. She states it feels as if the plantar aspect of the foot is swollen. Patient is here for further evaluation and treatment.   Past Medical History:  Diagnosis Date  . Anemia    requiring transfusion  . Boil of buttock ~ 2016  . Depression   . Diabetic neuropathy ([place])    Archie Endo 08/23/2017  . DKA (diabetic ketoacidoses) ([place])    recent/notes 01/06/2015  . Hyperlipidemia   . Hypertension   . Neuropathy   . Peripheral vascular disease ([place])   . Toe ulcer ([place])    left great toe/notes 08/23/2017  . Type II diabetes mellitus ([place])       Objective/Physical Exam Skin incisions appear to be well coapted. No sign of infectious process noted. No dehiscence. No active bleeding noted. Improved edema noted to the surgical extremity.   Radiographic Exam:  Osteotomies sites appear to be stable with routine healing.   Assessment: 1. s/p partial first ray amputation left lower extremity. DOS: 10/26/17. - healed   Plan of Care:  1. Patient was evaluated.  X-rays reviewed. 2. Continue wearing DM shoes and insoles. 3. Recommended bilateral lower extremity compression socks. 4. Return to clinic as needed.   Edrick Kins, DPM [facility]  Dr. Edrick Kins, [address]                                        [place], [place] [address]                Office [phone]  Fax [phone]

## 2017-12-10 ENCOUNTER — Ambulatory Visit
Admission: RE | Admit: 2017-12-10 | Discharge: 2017-12-10 | Disposition: A | Discharge status: Home / Self Care | Payer: Self-pay | Source: Ambulatory Visit | Attending: Endocrinology | Admitting: Endocrinology

## 2017-12-10 DIAGNOSIS — E049 Nontoxic goiter, unspecified: Secondary | ICD-10-CM

## 2017-12-17 ENCOUNTER — Ambulatory Visit: Payer: Self-pay | Attending: Internal Medicine | Admitting: Internal Medicine

## 2017-12-17 ENCOUNTER — Encounter: Payer: Self-pay | Admitting: Internal Medicine

## 2017-12-17 VITALS — BP 168/73 | HR 78 | Temp 98.0°F | Resp 16 | Wt 218.8 lb

## 2017-12-17 DIAGNOSIS — Z833 Family history of diabetes mellitus: Secondary | ICD-10-CM | POA: Insufficient documentation

## 2017-12-17 DIAGNOSIS — K029 Dental caries, unspecified: Secondary | ICD-10-CM | POA: Insufficient documentation

## 2017-12-17 DIAGNOSIS — M7989 Other specified soft tissue disorders: Secondary | ICD-10-CM | POA: Insufficient documentation

## 2017-12-17 DIAGNOSIS — J069 Acute upper respiratory infection, unspecified: Secondary | ICD-10-CM | POA: Insufficient documentation

## 2017-12-17 DIAGNOSIS — D649 Anemia, unspecified: Secondary | ICD-10-CM | POA: Insufficient documentation

## 2017-12-17 DIAGNOSIS — Z888 Allergy status to other drugs, medicaments and biological substances status: Secondary | ICD-10-CM | POA: Insufficient documentation

## 2017-12-17 DIAGNOSIS — E785 Hyperlipidemia, unspecified: Secondary | ICD-10-CM | POA: Insufficient documentation

## 2017-12-17 DIAGNOSIS — Z809 Family history of malignant neoplasm, unspecified: Secondary | ICD-10-CM | POA: Insufficient documentation

## 2017-12-17 DIAGNOSIS — I10 Essential (primary) hypertension: Secondary | ICD-10-CM | POA: Insufficient documentation

## 2017-12-17 DIAGNOSIS — Z124 Encounter for screening for malignant neoplasm of cervix: Secondary | ICD-10-CM | POA: Insufficient documentation

## 2017-12-17 DIAGNOSIS — F329 Major depressive disorder, single episode, unspecified: Secondary | ICD-10-CM | POA: Insufficient documentation

## 2017-12-17 DIAGNOSIS — Z89422 Acquired absence of other left toe(s): Secondary | ICD-10-CM | POA: Insufficient documentation

## 2017-12-17 DIAGNOSIS — IMO0002 Reserved for concepts with insufficient information to code with codable children: Secondary | ICD-10-CM

## 2017-12-17 DIAGNOSIS — Z79899 Other long term (current) drug therapy: Secondary | ICD-10-CM | POA: Insufficient documentation

## 2017-12-17 DIAGNOSIS — E041 Nontoxic single thyroid nodule: Secondary | ICD-10-CM | POA: Insufficient documentation

## 2017-12-17 DIAGNOSIS — D509 Iron deficiency anemia, unspecified: Secondary | ICD-10-CM | POA: Insufficient documentation

## 2017-12-17 DIAGNOSIS — E1143 Type 2 diabetes mellitus with diabetic autonomic (poly)neuropathy: Secondary | ICD-10-CM | POA: Insufficient documentation

## 2017-12-17 DIAGNOSIS — E11319 Type 2 diabetes mellitus with unspecified diabetic retinopathy without macular edema: Secondary | ICD-10-CM | POA: Insufficient documentation

## 2017-12-17 DIAGNOSIS — K219 Gastro-esophageal reflux disease without esophagitis: Secondary | ICD-10-CM | POA: Insufficient documentation

## 2017-12-17 DIAGNOSIS — Z794 Long term (current) use of insulin: Secondary | ICD-10-CM | POA: Insufficient documentation

## 2017-12-17 DIAGNOSIS — Z9049 Acquired absence of other specified parts of digestive tract: Secondary | ICD-10-CM | POA: Insufficient documentation

## 2017-12-17 DIAGNOSIS — Z841 Family history of disorders of kidney and ureter: Secondary | ICD-10-CM | POA: Insufficient documentation

## 2017-12-17 DIAGNOSIS — Z91048 Other nonmedicinal substance allergy status: Secondary | ICD-10-CM | POA: Insufficient documentation

## 2017-12-17 DIAGNOSIS — Z8249 Family history of ischemic heart disease and other diseases of the circulatory system: Secondary | ICD-10-CM | POA: Insufficient documentation

## 2017-12-17 DIAGNOSIS — E1165 Type 2 diabetes mellitus with hyperglycemia: Secondary | ICD-10-CM | POA: Insufficient documentation

## 2017-12-17 DIAGNOSIS — E118 Type 2 diabetes mellitus with unspecified complications: Secondary | ICD-10-CM

## 2017-12-17 LAB — GLUCOSE, POCT (MANUAL RESULT ENTRY): POC Glucose: 379 mg/dl — AB (ref 70–99)

## 2017-12-17 MED ORDER — LOSARTAN POTASSIUM 50 MG PO TABS: 75.0000 mg | ORAL_TABLET | Freq: Every day | ORAL | 6 refills | Status: DC

## 2017-12-17 MED ORDER — LORATADINE 10 MG PO TABS: 10.0000 mg | ORAL_TABLET | Freq: Every day | ORAL | 11 refills | Status: DC

## 2017-12-17 NOTE — Patient Instructions (Signed)
STOP Iron  Increase Losartan to 75 mg daily.  Check your blood pressure 2-3 times a week.  Goal is to be less than 140/90.  Call me if your blood pressure is persistently above this level.   Increase NPH insulin to 184 units daily.

## 2017-12-17 NOTE — Progress Notes (Signed)
Pt states she has some sinus issues going on  Pt states she has one nostril stopped up

## 2017-12-17 NOTE — Progress Notes (Signed)
Patient ID: Katherine [name], female    DOB: 02-25-1956  MRN: [id number]  CC: Gynecologic Exam   Subjective: Katherine [name] is a 62 y.o. female who presents for PAP Her concerns today include:   Pt is G0P0. -last pap was 5 yrs ago. No abnormal pap. No vaginal dischg or itching -sexually active with same spouse for 18 yrs -pt is menopausal   C/o sinus congestion x 1 wk -endorses rhinorrhea. Mild cough.  No fever Taking OTC med called [facility]  DM: saw Dr. Loanne Drilling since last visit.  He confirmed that pt should be on NPH 180 units daily.  She has been doing that -checking BS a.m and nights.  A.m range: 200-229. 1 hr before bedtime 200-220  HTN:  Took meds already for the a.m.  Back on HCTZ but still has some swelling in legs  Anemia:  Corrected.  Never received message to stop iron supplement  Patient Active Problem List   Diagnosis Date Noted  . Thyroid nodule 10/04/2017  . Microcytic anemia 09/11/2017  . Critical lower limb ischemia 09/10/2017  . Retinopathy 08/22/2017  . Thyroid enlargement 07/12/2017  . Dyslipidemia 09/20/2016  . DEPRESSION 09/10/2009  . LEG CRAMPS 05/19/2009  . GERD 02/15/2009  . Gastroparesis 09/03/2008  . DENTAL CARIES 07/14/2008  . Diabetes mellitus type 2 with complications, uncontrolled ([place]) 09/16/2003  . Essential hypertension 09/16/2003     Current Outpatient Medications on File Prior to Visit  Medication Sig Dispense Refill  . amLODipine (NORVASC) 10 MG tablet Take 1 tablet (10 mg total) by mouth daily. 90 tablet 3  . atorvastatin (LIPITOR) 80 MG tablet Take 1 tablet (80 mg total) by mouth daily at 6 PM. 30 tablet 6  . Blood Glucose Monitoring Suppl (TRUE METRIX METER) w/Device KIT Use as directed 1 kit 0  . clopidogrel (PLAVIX) 75 MG tablet Take 1 tablet (75 mg total) by mouth daily with breakfast. 90 tablet 0  . docusate sodium (COLACE) 100 MG capsule Take 1 capsule (100 mg total) by mouth daily. (Patient taking differently: Take 100  mg by mouth daily as needed for mild constipation. ) 30 capsule 4  . gabapentin (NEURONTIN) 300 MG capsule Take 2 capsules (600 mg total) by mouth 3 (three) times daily. 180 capsule 5  . glucose blood (TRUE METRIX BLOOD GLUCOSE TEST) test strip Use as instructed 100 each 12  . hydrALAZINE (APRESOLINE) 100 MG tablet Take 1 tablet (100 mg total) by mouth 3 (three) times daily. 90 tablet 2  . hydrochlorothiazide (HYDRODIURIL) 25 MG tablet Take 1 tablet (25 mg total) by mouth daily. 30 tablet 6  . Insulin NPH, Human,, Isophane, (HUMULIN N KWIKPEN) 100 UNIT/ML Kiwkpen Inject 180 Units every morning into the skin. And pen needles 1/day 60 mL 11  . Insulin Pen Needle (ULTICARE MICRO PEN NEEDLES) 32G X 4 MM MISC Use as directed 100 each 11  . Multiple Vitamins-Minerals (MULTIVITAMIN ADULT) CHEW Chew 1 tablet by mouth daily.    . ondansetron (ZOFRAN) 4 MG tablet Take 1 tablet (4 mg total) by mouth every 8 (eight) hours as needed for nausea or vomiting. 20 tablet 0  . oxyCODONE-acetaminophen (ROXICET) 5-325 MG tablet Take 1 tablet by mouth every 6 (six) hours as needed. 30 tablet 0  . potassium chloride (K-DUR) 10 MEQ tablet Take 1 tablet (10 mEq total) by mouth daily. 30 tablet 6  . sertraline (ZOLOFT) 50 MG tablet 1/2 tab daily x 2 wk then 1 tab PO  daily 30 tablet 3  . traZODone (DESYREL) 50 MG tablet Take 0.5-1 tablets (25-50 mg total) by mouth at bedtime as needed for sleep. 30 tablet 3  . TRUEPLUS LANCETS 28G MISC Use as directed 100 each 12  . [DISCONTINUED] sitaGLIPtin (JANUVIA) 100 MG tablet Take 100 mg by mouth daily.     No current facility-administered medications on file prior to visit.     Allergies  Allergen Reactions  . Other Swelling    Seaweed= swelling on arms, hands and face  . Metformin And Related Nausea Only    Social History   Socioeconomic History  . Marital status: Married    Spouse name: Not on file  . Number of children: Not on file  . Years of education: Not on file    . Highest education level: Not on file  Social Needs  . Financial resource strain: Not on file  . Food insecurity - worry: Not on file  . Food insecurity - inability: Not on file  . Transportation needs - medical: Not on file  . Transportation needs - non-medical: Not on file  Occupational History  . Not on file  Tobacco Use  . Smoking status: Never Smoker  . Smokeless tobacco: Never Used  Substance and Sexual Activity  . Alcohol use: No  . Drug use: No  . Sexual activity: No  Other Topics Concern  . Not on file  Social History Narrative  . Not on file    Family History  Problem Relation Age of Onset  . Diabetes Father   . Hypertension Father   . Kidney disease Mother   . Hypertension Mother   . Cancer Maternal Grandmother   . Hypertension Maternal Grandfather   . Diabetes Paternal Aunt   . Diabetes Paternal Uncle   . Diabetes Paternal Grandfather   . Colon cancer Neg Hx     Past Surgical History:  Procedure Laterality Date  . AMPUTATION TOE Left 10/26/2017   Procedure: AMPUTATION PARTIAL RAY LEFT FOOT AND IRRIGATION/DEBRIDEMENT;  Surgeon: Edrick Kins, DPM;  Location: [facility];  Service: Podiatry;  Laterality: Left;  AMPUTATION PARTIAL RAY LEFT FOOT AND IRRIGATION/DEBRIDEMENT  . CHOLECYSTECTOMY  01/24/2003   Archie Endo 04/04/2011  . COLONOSCOPY    . INCISION AND DRAINAGE ABSCESS  03/17/2012   "right but; left lower abdoment"/notes 03/17/2012  . LOWER EXTREMITY INTERVENTION N/A 09/10/2017   Procedure: LOWER EXTREMITY INTERVENTION;  Surgeon: Lorretta Harp, MD;  Location: [facility] [facility];  Service: Cardiovascular;  Laterality: N/A;  . PERIPHERAL VASCULAR ATHERECTOMY  09/10/2017   Procedure: PERIPHERAL VASCULAR ATHERECTOMY;  Surgeon: Lorretta Harp, MD;  Location: [place];  Service: Cardiovascular;;  left AT    ROS: Review of Systems Neg except as above  PHYSICAL EXAM: BP (!) 168/73   Pulse 78   Temp 98 F (36.7 C) (Oral)   Resp 16   Wt 218 lb  12.8 oz (99.2 kg)   SpO2 96%   BMI 29.67 kg/m   Physical Exam  General appearance - alert, well appearing, and in no distress Mental status - alert, oriented to person, place, and time Pelvic - Boykin Reaper CMA present:  normal external genitalia, vulva, vagina, cervix, uterus and adnexa.  Small amount of white discgh in vaginal vault Ext: trace LE edema LT>RT   Results for orders placed or performed in visit on 12/17/17  POCT glucose (manual entry)  Result Value Ref Range   POC Glucose 379 (A) 70 -  99 mg/dl   BS 379  ASSESSMENT AND PLAN: 1. Essential hypertension -not tat goal.  Advise to stop the OTC cold med as it may have pseudoephedrine or phenylephrine in it -Increase Cozaar to 75 mg daily Patient to check blood pressure twice a week and record readings.  If readings persistently above 140/90, she is asked to contact me for further instructions - losartan (COZAAR) 50 MG tablet; Take 1.5 tablets (75 mg total) by mouth daily.  Dispense: 45 tablet; Refill: 6  2. Diabetes mellitus type 2 with complications, uncontrolled ([place]) -recommend increase NPH to 184 units daily.  Advise to call Dr. Cordelia Pen office if [facility] remain persistently above 200 - POCT glucose (manual entry)  3. Pap smear for cervical cancer screening - Cytology - PAP  4. Viral upper respiratory tract infection - loratadine (CLARITIN) 10 MG tablet; Take 1 tablet (10 mg total) by mouth daily.  Dispense: 30 tablet; Refill: 11  5.  Anemia: Stop iron  Patient was given the opportunity to ask questions.  Patient verbalized understanding of the plan and was able to repeat key elements of the plan.   Orders Placed This Encounter  Procedures  . POCT glucose (manual entry)     Requested Prescriptions   Signed Prescriptions Disp Refills  . losartan (COZAAR) 50 MG tablet 45 tablet 6    Sig: Take 1.5 tablets (75 mg total) by mouth daily.  Marland Kitchen loratadine (CLARITIN) 10 MG tablet 30 tablet 11    Sig: Take 1 tablet  (10 mg total) by mouth daily.    Return in about 3 months (around 03/17/2018).  Karle Plumber, MD, FACP

## 2017-12-18 LAB — CYTOLOGY - PAP
CHLAMYDIA, DNA PROBE: NEGATIVE
Diagnosis: NEGATIVE
HPV: NOT DETECTED
NEISSERIA GONORRHEA: NEGATIVE
TRICH (WINDOWPATH): NEGATIVE

## 2017-12-19 ENCOUNTER — Telehealth: Payer: Self-pay

## 2017-12-19 DIAGNOSIS — E118 Type 2 diabetes mellitus with unspecified complications: Principal | ICD-10-CM

## 2017-12-19 DIAGNOSIS — E1165 Type 2 diabetes mellitus with hyperglycemia: Secondary | ICD-10-CM

## 2017-12-19 DIAGNOSIS — IMO0002 Reserved for concepts with insufficient information to code with codable children: Secondary | ICD-10-CM

## 2017-12-19 NOTE — Telephone Encounter (Signed)
-----   Message from Renato Shin, MD sent at 12/10/2017  6:59 PM EST ----- please call patient: Just some tiny nodules.  No further thyroid testing or treatment is needed now.

## 2017-12-19 NOTE — Telephone Encounter (Signed)
Contacted pt to go over pap results pt didn't answer lvm asking pt to give me a call at her earliest convienence   If pt calls back please give results: pap was normal. Screen for chlamydia, gonorrhea, Trichomonas and HPV are negative.

## 2017-12-19 NOTE — Telephone Encounter (Signed)
done

## 2017-12-19 NOTE — Telephone Encounter (Signed)
Spoke to patient. Gave results. Patient verbalized understanding. Pt requesting referral to ophthalmologist.

## 2017-12-24 NOTE — Progress Notes (Signed)
DOS 10/26/17 Incision and drainage Lt, Lt great toe amputation

## 2017-12-25 MED FILL — POTASSIUM CL 10 MEQ TAB SA: 10 | 30 days supply | Qty: 30 | Fill #0

## 2018-01-04 MED FILL — GABAPENTIN 300 MG CAPSULE: 300 | 30 days supply | Qty: 180 | Fill #3

## 2018-01-07 MED FILL — HYDROCHLOROTHIAZIDE 25 MG T: 25 | 30 days supply | Qty: 30 | Fill #3

## 2018-01-10 ENCOUNTER — Other Ambulatory Visit: Payer: Self-pay | Admitting: Cardiology

## 2018-01-10 MED FILL — CLOPIDOGREL 75 MG TABLET: 75 | 90 days supply | Qty: 90 | Fill #0

## 2018-01-10 NOTE — Telephone Encounter (Signed)
Rx has been sent to the pharmacy electronically. ° °

## 2018-01-11 MED FILL — ATORVASTATIN 80 MG TABLET: 80 | 30 days supply | Qty: 30 | Fill #2

## 2018-01-14 ENCOUNTER — Ambulatory Visit: Payer: Self-pay

## 2018-01-23 IMAGING — CT CT ABD-PELV W/O CM
2 of 4 series · 16 of 46 positions shown, 18 images · non-contrast
Comparison: 12/11/2014 CT

CLINICAL DATA: 61-year-old female with right pelvic and groin pain
following catheterization.

EXAM:
CT ABDOMEN AND PELVIS WITHOUT CONTRAST
TECHNIQUE: Multidetector CT imaging of the abdomen and pelvis was performed
following the standard protocol without IV contrast.

[Series 3: a/p w/o 5mm · axial · non-contrast · 0.98mm/px · z∈[+764,+1229]mm · 13 of 103 slices shown, 15 images]
[im 5/103  soft-tissue]
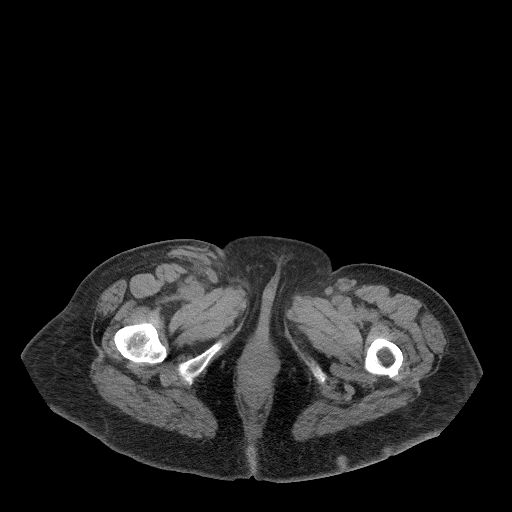
[im 5/103  bone]
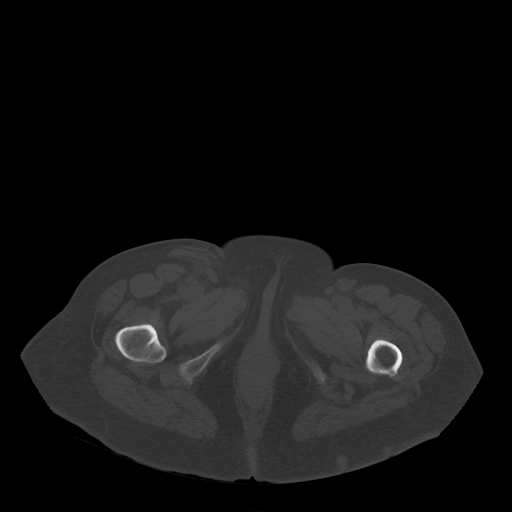
[im 13/103  soft-tissue]
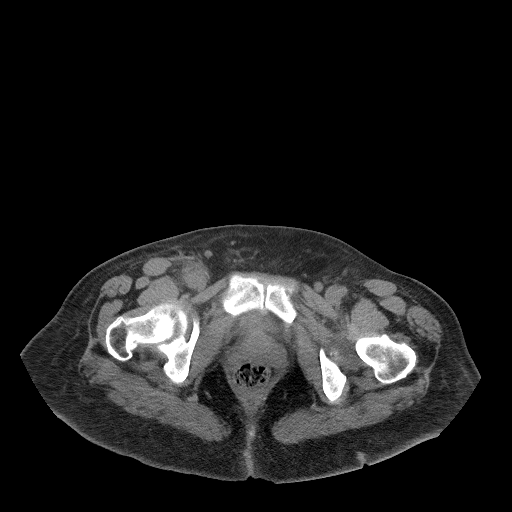
[im 22/103  soft-tissue]
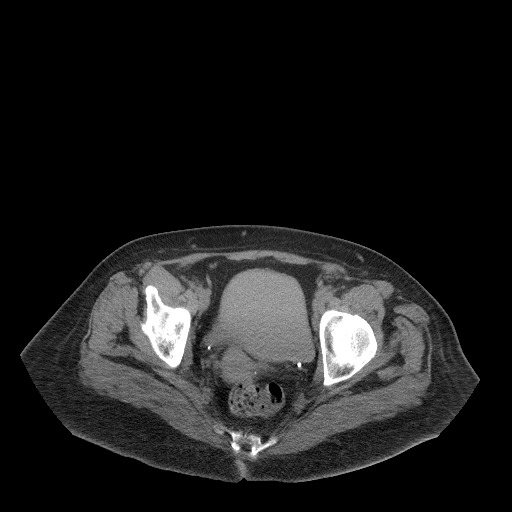
[im 30/103  soft-tissue]
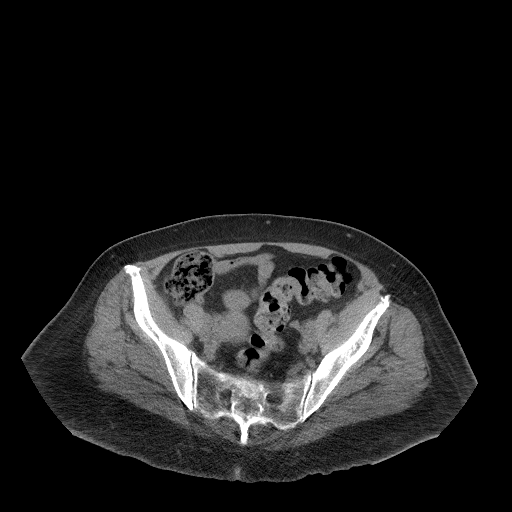
[im 35/103  soft-tissue]
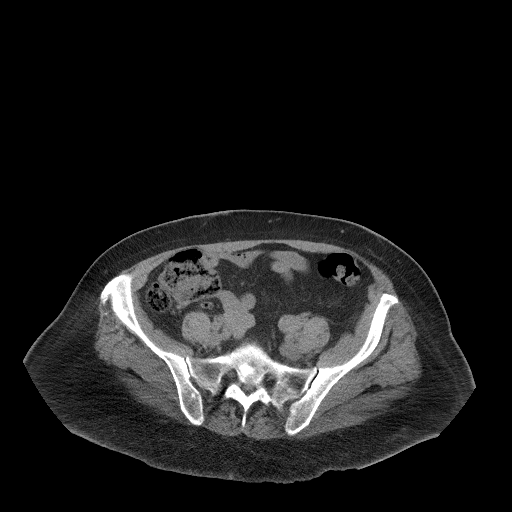
[im 43/103  soft-tissue]
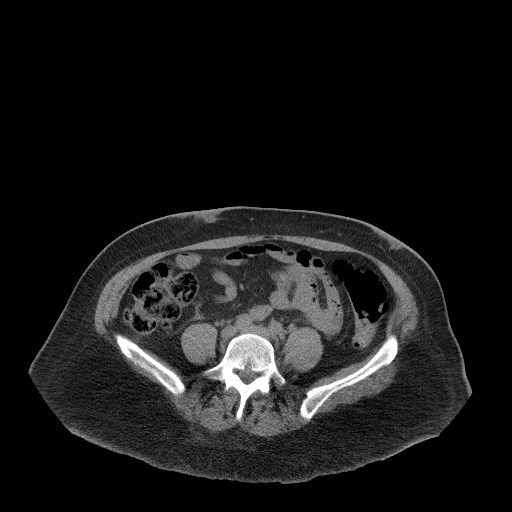
[im 52/103  soft-tissue]
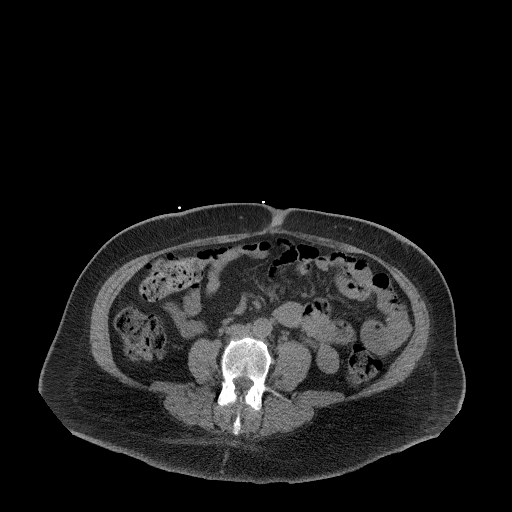
[im 60/103  soft-tissue]
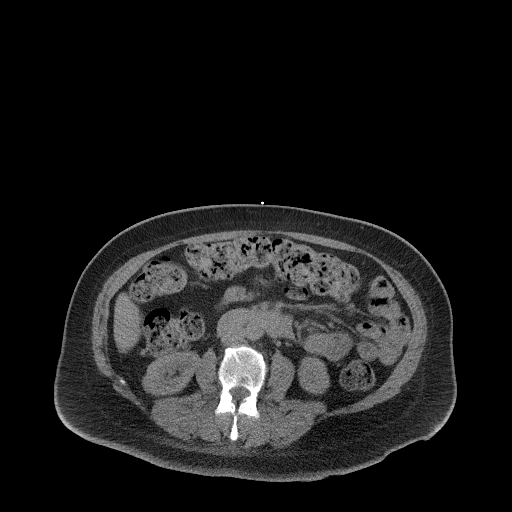
[im 69/103  soft-tissue]
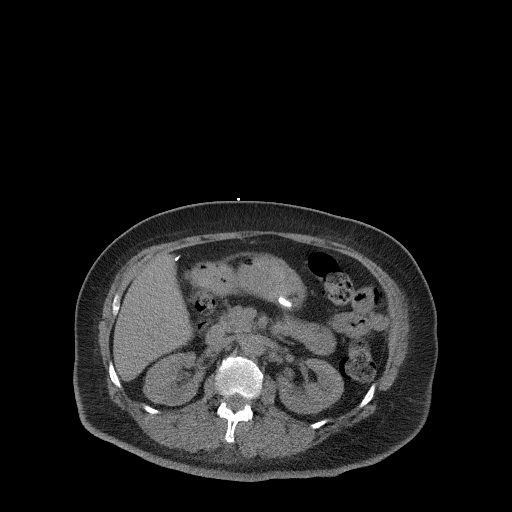
[im 69/103  bone]
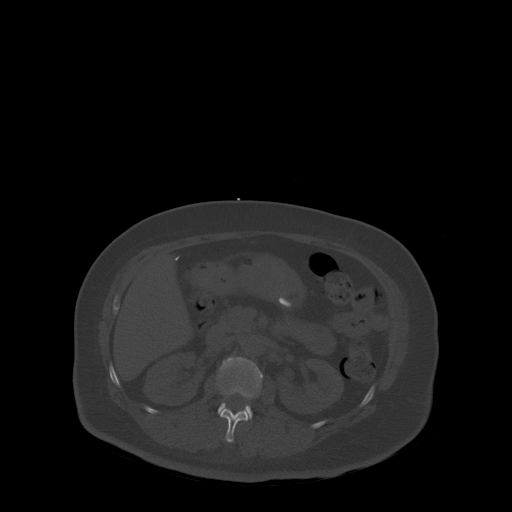
[im 73/103  soft-tissue]
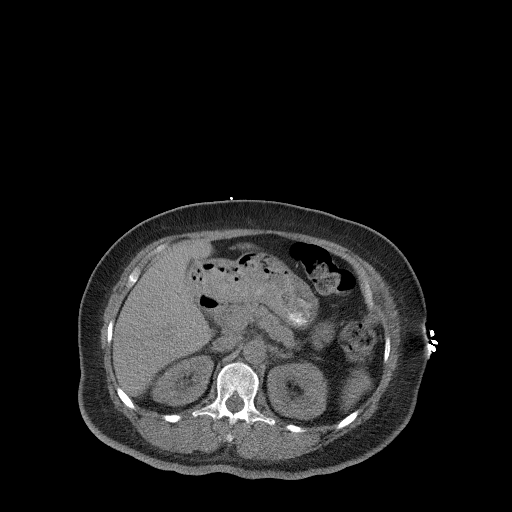
[im 81/103  soft-tissue]
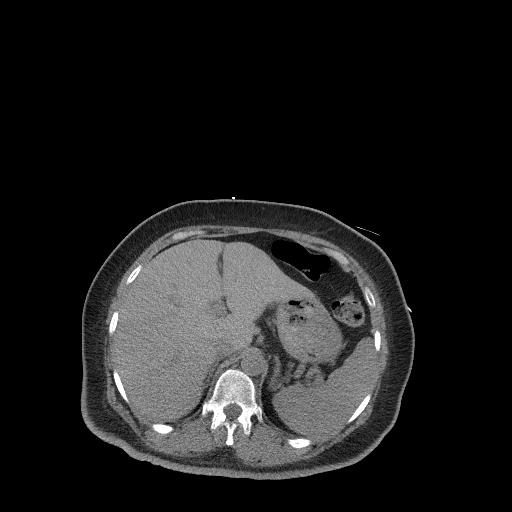
[im 90/103  soft-tissue]
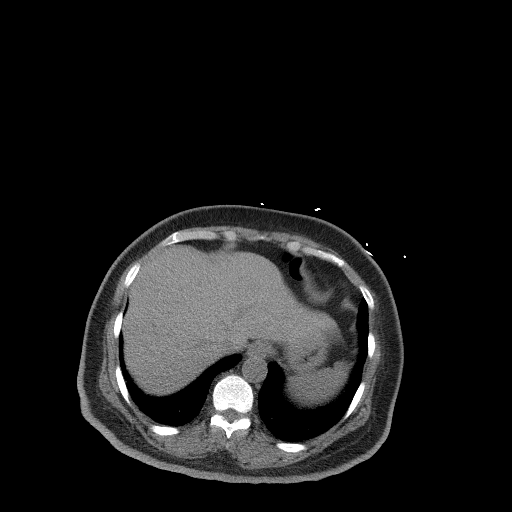
[im 98/103  soft-tissue]
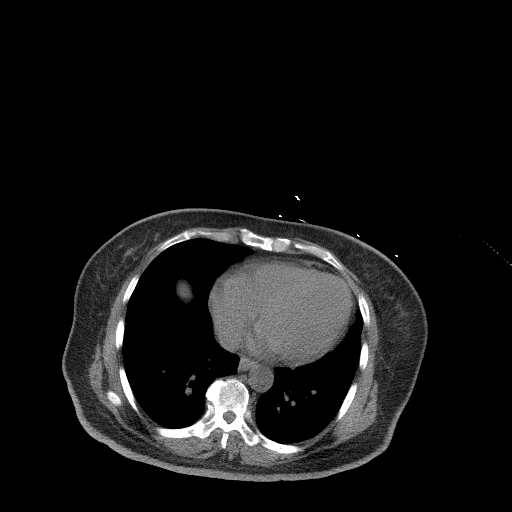

[Series 6: a/p w/o cor · coronal · non-contrast · 0.81mm/px · 3 of 140 slices shown]
[im 47/140  soft-tissue]
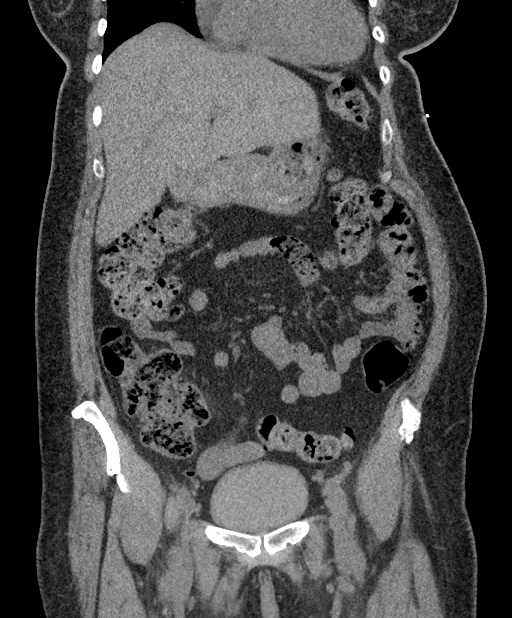
[im 62/140  soft-tissue]
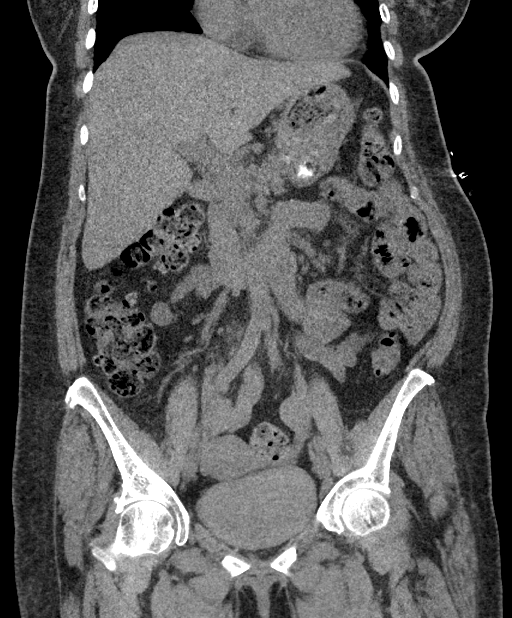
[im 78/140  soft-tissue]
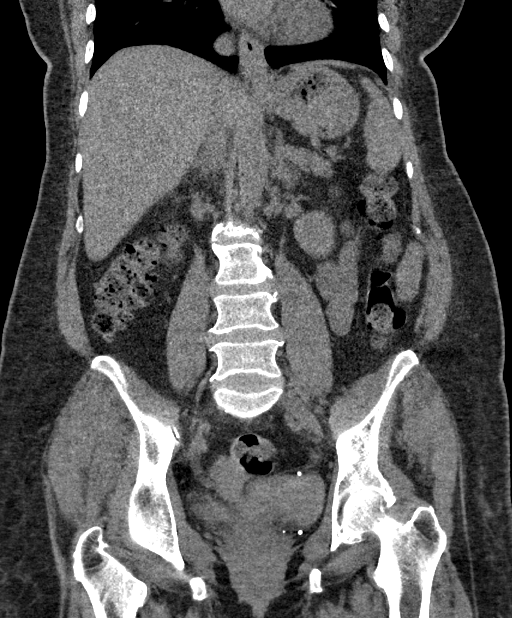

[16 of 46 positions shown; findings below may reference images not displayed]

FINDINGS: Please note that parenchymal abnormalities may be missed without
intravenous contrast.

Lower chest: No acute abnormality

Hepatobiliary: The liver is unremarkable. Patient is status post
cholecystectomy. No biliary dilatation.

Pancreas: Unremarkable

Spleen: Unremarkable

Adrenals/Urinary Tract: The kidneys, adrenal glands and bladder are
unremarkable.

Stomach/Bowel: Stomach is within normal limits. Appendix appears
normal. No evidence of bowel wall thickening, distention, or
inflammatory changes.

Vascular/Lymphatic: Ill-defined hemorrhage in the right femoral/
inguinal region is noted, measuring up to 3 x 4 x 5 cm in diameter.
There is no evidence of intra- abdominal or intrapelvic
hemorrhage/hematoma. Aortic atherosclerotic calcifications noted
without aneurysm. No enlarged lymph nodes.

Reproductive: Uterus and bilateral adnexa are unremarkable.

Other: No ascites or pneumoperitoneum identified.

Musculoskeletal: No acute or suspicious bony findings.
IMPRESSION: 1. Ill-defined hemorrhage in the right femoral/inguinal region
measuring 3 x 4 x 5 cm. No evidence of intra- abdominal or
intrapelvic hemorrhage/hematoma.
2.  Aortic Atherosclerosis ([id number]).

## 2018-02-01 ENCOUNTER — Encounter: Payer: Self-pay | Admitting: Endocrinology

## 2018-02-01 ENCOUNTER — Ambulatory Visit (INDEPENDENT_AMBULATORY_CARE_PROVIDER_SITE_OTHER): Payer: Self-pay | Admitting: Endocrinology

## 2018-02-01 VITALS — BP 160/100 | HR 76 | Wt 228.0 lb

## 2018-02-01 DIAGNOSIS — IMO0002 Reserved for concepts with insufficient information to code with codable children: Secondary | ICD-10-CM

## 2018-02-01 DIAGNOSIS — E118 Type 2 diabetes mellitus with unspecified complications: Secondary | ICD-10-CM

## 2018-02-01 DIAGNOSIS — E1165 Type 2 diabetes mellitus with hyperglycemia: Secondary | ICD-10-CM

## 2018-02-01 LAB — HEMOGLOBIN A1C: Hgb A1c MFr Bld: 10.1 % — ABNORMAL HIGH (ref 4.6–6.5)

## 2018-02-01 MED ORDER — INSULIN ISOPHANE HUMAN 100 UNIT/ML KWIKPEN: 190.0000 [IU] | PEN_INJECTOR | SUBCUTANEOUS | 11 refills | Status: DC

## 2018-02-01 NOTE — Progress Notes (Signed)
Subjective:    Patient ID: Katherine [name], female    DOB: [birth year], 62 y.o.   MRN: [id number]  HPI Pt returns for f/u of diabetes mellitus:  DM type: 1 Dx'ed: 4287 Complications: polyneuropathy, gastroparesis, foot ulcer, renal insuff, PAD, and DR.  Therapy: insulin since 2014 GDM: never DKA: once (2016) Severe hypoglycemia: never.   Pancreatitis: never Pancreatic imaging: normal on 2016 CT.   Other: she gets insulin through [facility]; she declines multiple daily injections; she did not tolerate levemir (rash); she was changed to NPH qam, due to the pattern of her cbg's.   Interval history: pt says she never misses the insulin.  no cbg record, but states cbg's vary from 70-200.  It is in general higher as the day goes on.  She takes just 160 units.   Pt also has h/o thyroid pseudonodule.   Past Medical History:  Diagnosis Date  . Anemia    requiring transfusion  . Boil of buttock ~ 2016  . Depression   . Diabetic neuropathy ([place])    Archie Endo 08/23/2017  . DKA (diabetic ketoacidoses) ([place])    recent/notes 01/06/2015  . Hyperlipidemia   . Hypertension   . Neuropathy   . Peripheral vascular disease ([place])   . Toe ulcer ([place])    left great toe/notes 08/23/2017  . Type II diabetes mellitus ([place])     Past Surgical History:  Procedure Laterality Date  . AMPUTATION TOE Left 10/26/2017   Procedure: AMPUTATION PARTIAL RAY LEFT FOOT AND IRRIGATION/DEBRIDEMENT;  Surgeon: Edrick Kins, DPM;  Location: [place];  Service: Podiatry;  Laterality: Left;  AMPUTATION PARTIAL RAY LEFT FOOT AND IRRIGATION/DEBRIDEMENT  . CHOLECYSTECTOMY  01/24/2003   Archie Endo 04/04/2011  . COLONOSCOPY    . INCISION AND DRAINAGE ABSCESS  03/17/2012   "right but; left lower abdoment"/notes 03/17/2012  . LOWER EXTREMITY INTERVENTION N/A 09/10/2017   Procedure: LOWER EXTREMITY INTERVENTION;  Surgeon: Lorretta Harp, MD;  Location: [place] [facility];  Service: Cardiovascular;  Laterality: N/A;  . PERIPHERAL VASCULAR  ATHERECTOMY  09/10/2017   Procedure: PERIPHERAL VASCULAR ATHERECTOMY;  Surgeon: Lorretta Harp, MD;  Location: [place] [facility];  Service: Cardiovascular;;  left AT    Social History   Socioeconomic History  . Marital status: Married    Spouse name: Not on file  . Number of children: Not on file  . Years of education: Not on file  . Highest education level: Not on file  Social Needs  . Financial resource strain: Not on file  . Food insecurity - worry: Not on file  . Food insecurity - inability: Not on file  . Transportation needs - medical: Not on file  . Transportation needs - non-medical: Not on file  Occupational History  . Not on file  Tobacco Use  . Smoking status: Never Smoker  . Smokeless tobacco: Never Used  Substance and Sexual Activity  . Alcohol use: No  . Drug use: No  . Sexual activity: No  Other Topics Concern  . Not on file  Social History Narrative  . Not on file    Current Outpatient Medications on File Prior to Visit  Medication Sig Dispense Refill  . amLODipine (NORVASC) 10 MG tablet Take 1 tablet (10 mg total) by mouth daily. 90 tablet 3  . atorvastatin (LIPITOR) 80 MG tablet Take 1 tablet (80 mg total) by mouth daily at 6 PM. 30 tablet 6  . Blood Glucose Monitoring Suppl (TRUE METRIX METER) w/Device KIT  Use as directed 1 kit 0  . clopidogrel (PLAVIX) 75 MG tablet Take 1 tablet (75 mg total) by mouth daily with breakfast. 90 tablet 1  . docusate sodium (COLACE) 100 MG capsule Take 1 capsule (100 mg total) by mouth daily. (Patient taking differently: Take 100 mg by mouth daily as needed for mild constipation. ) 30 capsule 4  . gabapentin (NEURONTIN) 300 MG capsule Take 2 capsules (600 mg total) by mouth 3 (three) times daily. 180 capsule 5  . glucose blood (TRUE METRIX BLOOD GLUCOSE TEST) test strip Use as instructed 100 each 12  . hydrALAZINE (APRESOLINE) 100 MG tablet Take 1 tablet (100 mg total) by mouth 3 (three) times daily. 90 tablet 2  .  hydrochlorothiazide (HYDRODIURIL) 25 MG tablet Take 1 tablet (25 mg total) by mouth daily. 30 tablet 6  . Insulin Pen Needle (ULTICARE MICRO PEN NEEDLES) 32G X 4 MM MISC Use as directed 100 each 11  . loratadine (CLARITIN) 10 MG tablet Take 1 tablet (10 mg total) by mouth daily. 30 tablet 11  . losartan (COZAAR) 50 MG tablet Take 1.5 tablets (75 mg total) by mouth daily. 45 tablet 6  . Multiple Vitamins-Minerals (MULTIVITAMIN ADULT) CHEW Chew 1 tablet by mouth daily.    . ondansetron (ZOFRAN) 4 MG tablet Take 1 tablet (4 mg total) by mouth every 8 (eight) hours as needed for nausea or vomiting. 20 tablet 0  . potassium chloride (K-DUR) 10 MEQ tablet Take 1 tablet (10 mEq total) by mouth daily. 30 tablet 6  . sertraline (ZOLOFT) 50 MG tablet 1/2 tab daily x 2 wk then 1 tab PO daily 30 tablet 3  . traZODone (DESYREL) 50 MG tablet Take 0.5-1 tablets (25-50 mg total) by mouth at bedtime as needed for sleep. 30 tablet 3  . TRUEPLUS LANCETS 28G MISC Use as directed 100 each 12  . [DISCONTINUED] sitaGLIPtin (JANUVIA) 100 MG tablet Take 100 mg by mouth daily.     No current facility-administered medications on file prior to visit.     Allergies  Allergen Reactions  . Other Swelling    Seaweed= swelling on arms, hands and face  . Metformin And Related Nausea Only    Family History  Problem Relation Age of Onset  . Diabetes Father   . Hypertension Father   . Kidney disease Mother   . Hypertension Mother   . Cancer Maternal Grandmother   . Hypertension Maternal Grandfather   . Diabetes Paternal Aunt   . Diabetes Paternal Uncle   . Diabetes Paternal Grandfather   . Colon cancer Neg Hx     BP (!) 160/100 (BP Location: Left Arm, Patient Position: Sitting, Cuff Size: Normal)   Pulse 76   Wt 228 lb (103.4 kg)   SpO2 97%   BMI 30.92 kg/m   Review of Systems Denies LOC    Objective:   Physical Exam VITAL SIGNS:  See vs page GENERAL: no distress Pulses: dorsalis pedis absent bilat  (poss due to edema) MSK: no deformity of the feet, except left great toe is absent CV: 1+ bilat leg edema Skin:  no ulcer on the feet.  normal color and temp on the feet. Neuro: sensation is intact to touch on the feet, but decreased from normal.    Lab Results  Component Value Date   HGBA1C 10.1 (H) 02/01/2018       Assessment & Plan:  HTN: is noted today.   Type 1 DM, with PAD.  Worse.  Noncompliance with cbg recording and insulin dosing.  We discussed.    Patient Instructions  Your blood pressure is high today.  Please see your heart doctor soon, to have it rechecked check your blood sugar twice a day.  vary the time of day when you check, between before the 3 meals, and at bedtime.  also check if you have symptoms of your blood sugar being too high or too low.  please keep a record of the readings and bring it to your next appointment here (or you can bring the meter itself).  You can write it on any piece of paper.  please call us sooner if your blood sugar goes below 70, or if you have a lot of readings over 200.   blood tests are requested for you today.  We'll let you know about the results.  Please come back for a follow-up appointment in 2 months.

## 2018-02-01 NOTE — Patient Instructions (Addendum)
Your blood pressure is high today.  Please see your heart doctor soon, to have it rechecked check your blood sugar twice a day.  vary the time of day when you check, between before the 3 meals, and at bedtime.  also check if you have symptoms of your blood sugar being too high or too low.  please keep a record of the readings and bring it to your next appointment here (or you can bring the meter itself).  You can write it on any piece of paper.  please call us sooner if your blood sugar goes below 70, or if you have a lot of readings over 200.   blood tests are requested for you today.  We'll let you know about the results.  Please come back for a follow-up appointment in 2 months.

## 2018-02-05 ENCOUNTER — Ambulatory Visit (INDEPENDENT_AMBULATORY_CARE_PROVIDER_SITE_OTHER): Payer: Self-pay | Admitting: Cardiovascular Disease

## 2018-02-05 ENCOUNTER — Other Ambulatory Visit: Payer: Self-pay

## 2018-02-05 ENCOUNTER — Encounter: Payer: Self-pay | Admitting: Cardiovascular Disease

## 2018-02-05 DIAGNOSIS — I10 Essential (primary) hypertension: Secondary | ICD-10-CM

## 2018-02-05 DIAGNOSIS — E785 Hyperlipidemia, unspecified: Secondary | ICD-10-CM

## 2018-02-05 DIAGNOSIS — I998 Other disorder of circulatory system: Secondary | ICD-10-CM

## 2018-02-05 DIAGNOSIS — I70229 Atherosclerosis of native arteries of extremities with rest pain, unspecified extremity: Secondary | ICD-10-CM

## 2018-02-05 MED ORDER — INSULIN ISOPHANE HUMAN 100 UNIT/ML KWIKPEN: 190.0000 [IU] | PEN_INJECTOR | SUBCUTANEOUS | 11 refills | Status: DC

## 2018-02-05 MED ORDER — LOSARTAN POTASSIUM 100 MG PO TABS: 100.0000 mg | ORAL_TABLET | Freq: Every day | ORAL | 6 refills | Status: DC

## 2018-02-05 MED FILL — TRUEPLUS PEN NDL 32GX5/32": 32G X 4 MM | 30 days supply | Qty: 100 | Fill #0

## 2018-02-05 MED FILL — LOSARTAN POTASSIUM 100 MG T: 100 | 30 days supply | Qty: 30 | Fill #0

## 2018-02-05 MED FILL — HUMULIN N 100 UNITS/ML KWIK: 100 | 7 days supply | Qty: 15 | Fill #0

## 2018-02-05 MED FILL — TRUEPLUS PEN NDL 32GX5/32: 32G X 4 MM | 30 days supply | Qty: 100 | Fill #0

## 2018-02-05 NOTE — Addendum Note (Signed)
Addended by: Therisa Doyne on: 02/05/2018 11:53 AM   Modules accepted: Orders

## 2018-02-05 NOTE — Assessment & Plan Note (Signed)
History of hyperlipidemia on statin therapy followed by her PCP. 

## 2018-02-05 NOTE — Patient Instructions (Signed)
Medication Instructions: Increase Losartan to 100 mg daily.   Testing/Procedures:  Schedule for May: Your physician has requested that you have a lower extremity arterial duplex. During this test, ultrasound is used to evaluate arterial blood flow in the legs. Allow one hour for this exam. There are no restrictions or special instructions.  Your physician has requested that you have an ankle brachial index (ABI). During this test an ultrasound and blood pressure cuff are used to evaluate the arteries that supply the arms and legs with blood. Allow thirty minutes for this exam. There are no restrictions or special instructions.  Follow-Up: Your physician wants you to follow-up in: 6 months with Dr. Gwenlyn Found. You will receive a reminder letter in the mail two months in advance. If you don't receive a letter, please call our office to schedule the follow-up appointment.  If you need a refill on your cardiac medications before your next appointment, please call your pharmacy.

## 2018-02-05 NOTE — Assessment & Plan Note (Signed)
History of essential hypertension blood pressure measured at 142/86. She is on amlodipine, hydralazine, hydrochlorothiazide and losartan. I am going to increase her losartan from 75 mg to 100 mg a day.

## 2018-02-05 NOTE — Progress Notes (Signed)
02/05/2018 Katherine [name]   1955-11-24  [id number]  Primary Physician Ladell Pier, MD Primary Cardiologist: Lorretta Harp MD Renae Gloss  HPI:  Katherine [name] is a 62 y.o.  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 She was referred to me by Dr. Amalia Hailey for peripheral vascular evaluation because of critical limb ischemia.I last saw her in the office 10/31/17 .She does have a history of treated hypertension, diabetes and hyperlipidemia. She has never had a heart attack or stroke. She does have a gangrenous left great toe is an ulcer there for several months having undergone several rounds of antibiotic therapy. Her left toe is painful. She had recent venous Doppler study that did not show evidence of DVT.I perform peripheral angiography of her 09/10/17 revealing an occluded left anterior tibial posterior tibial artery with a patent peroneal. I was able to recanalize the anterior tibial down to the foot. Her hospital course was mildly prolonged because of unexplained anemia requiring transfusion. She did not have a retroperitoneal hematoma by CT. Her subsequent lower extremity arterial Doppler studies performed on 09/24/17 showed an increase in her left ABI from 0.67 up to 1.03. When I saw her last approximately 5 weeks ago she still had some edema with an unchanged ischemic ulcer. She also underwent left first toe partial ray amputation by Dr. Amalia Hailey on 10/26/17 .  Since I saw her 3 months ago she continues to do well. She does have some nocturnal pain in that foot and some swelling. Her Dopplers again improved back to baseline and her ray resection has completely healed.      Current Meds  Medication Sig  . amLODipine (NORVASC) 10 MG tablet Take 1 tablet (10 mg total) by mouth daily.  Marland Kitchen atorvastatin (LIPITOR) 80 MG tablet Take 1 tablet (80 mg total) by mouth daily at 6 PM.  . Blood Glucose Monitoring Suppl (TRUE  METRIX METER) w/Device KIT Use as directed  . clopidogrel (PLAVIX) 75 MG tablet Take 1 tablet (75 mg total) by mouth daily with breakfast.  . docusate sodium (COLACE) 100 MG capsule Take 1 capsule (100 mg total) by mouth daily. (Patient taking differently: Take 100 mg by mouth daily as needed for mild constipation. )  . gabapentin (NEURONTIN) 300 MG capsule Take 2 capsules (600 mg total) by mouth 3 (three) times daily.  Marland Kitchen glucose blood (TRUE METRIX BLOOD GLUCOSE TEST) test strip Use as instructed  . hydrALAZINE (APRESOLINE) 100 MG tablet Take 1 tablet (100 mg total) by mouth 3 (three) times daily.  . hydrochlorothiazide (HYDRODIURIL) 25 MG tablet Take 1 tablet (25 mg total) by mouth daily.  . Insulin NPH, Human,, Isophane, (HUMULIN N KWIKPEN) 100 UNIT/ML Kiwkpen Inject 190 Units into the skin every morning. And pen needles 1/day  . Insulin Pen Needle (ULTICARE MICRO PEN NEEDLES) 32G X 4 MM MISC Use as directed  . loratadine (CLARITIN) 10 MG tablet Take 1 tablet (10 mg total) by mouth daily.  Marland Kitchen losartan (COZAAR) 50 MG tablet Take 1.5 tablets (75 mg total) by mouth daily.  . Multiple Vitamins-Minerals (MULTIVITAMIN ADULT) CHEW Chew 1 tablet by mouth daily.  . ondansetron (ZOFRAN) 4 MG tablet Take 1 tablet (4 mg total) by mouth every 8 (eight) hours as needed for nausea or vomiting.  . potassium chloride (K-DUR) 10 MEQ tablet Take 1 tablet (10 mEq total) by mouth daily.  . TRUEPLUS LANCETS 28G MISC Use as directed  . [  DISCONTINUED] sertraline (ZOLOFT) 50 MG tablet 1/2 tab daily x 2 wk then 1 tab PO daily  . [DISCONTINUED] traZODone (DESYREL) 50 MG tablet Take 0.5-1 tablets (25-50 mg total) by mouth at bedtime as needed for sleep.     Allergies  Allergen Reactions  . Other Swelling    Seaweed= swelling on arms, hands and face  . Metformin And Related Nausea Only    Social History   Socioeconomic History  . Marital status: Married    Spouse name: Not on file  . Number of children: Not on  file  . Years of education: Not on file  . Highest education level: Not on file  Social Needs  . Financial resource strain: Not on file  . Food insecurity - worry: Not on file  . Food insecurity - inability: Not on file  . Transportation needs - medical: Not on file  . Transportation needs - non-medical: Not on file  Occupational History  . Not on file  Tobacco Use  . Smoking status: Never Smoker  . Smokeless tobacco: Never Used  Substance and Sexual Activity  . Alcohol use: No  . Drug use: No  . Sexual activity: No  Other Topics Concern  . Not on file  Social History Narrative  . Not on file     Review of Systems: General: negative for chills, fever, night sweats or weight changes.  Cardiovascular: negative for chest pain, dyspnea on exertion, edema, orthopnea, palpitations, paroxysmal nocturnal dyspnea or shortness of breath Dermatological: negative for rash Respiratory: negative for cough or wheezing Urologic: negative for hematuria Abdominal: negative for nausea, vomiting, diarrhea, bright red blood per rectum, melena, or hematemesis Neurologic: negative for visual changes, syncope, or dizziness All other systems reviewed and are otherwise negative except as noted above.    Blood pressure (!) 142/86, pulse 64, height 6' (1.829 m), weight 226 lb 9.6 oz (102.8 kg).  General appearance: alert and no distress Neck: no adenopathy, no carotid bruit, no JVD, supple, symmetrical, trachea midline and thyroid not enlarged, symmetric, no tenderness/mass/nodules Lungs: clear to auscultation bilaterally Heart: regular rate and rhythm, S1, S2 normal, no murmur, click, rub or gallop Extremities: extremities normal, atraumatic, no cyanosis or edema and left fifth toe ray resection has healed. There is 1+ peripheral edema on that side. Pulses: 2+ and symmetric Skin: Skin color, texture, turgor normal. No rashes or lesions Neurologic: Alert and oriented X 3, normal strength and tone.  Normal symmetric reflexes. Normal coordination and gait  EKG not performed today  ASSESSMENT AND PLAN:   Essential hypertension History of essential hypertension blood pressure measured at 142/86. She is on amlodipine, hydralazine, hydrochlorothiazide and losartan. I am going to increase her losartan from 75 mg to 100 mg a day.  Dyslipidemia History of hyperlipidemia on statin therapy followed by her PCP  Critical lower limb ischemia If you critical limb ischemia with gangrenous left toe ulcer for several months prior to seeing me on multiple rounds of antibiotics with painful toe. I performed angiography of her 09/10/17 revealing an occluded left anterior tibial artery which was I was able to recanalize. Her left ABI improved. She also underwent a left first toe partial ray amputation by Dr. Daylene Katayama 10/26/17 which ultimately healed. Her ABI went from 0.67 up to 1.03. She does have some mild swelling in that leg.      Lorretta Harp MD FACP,FACC,FAHA, [facility] 02/05/2018 11:49 AM

## 2018-02-05 NOTE — Assessment & Plan Note (Signed)
If you critical limb ischemia with gangrenous left toe ulcer for several months prior to seeing me on multiple rounds of antibiotics with painful toe. I performed angiography of her 09/10/17 revealing an occluded left anterior tibial artery which was I was able to recanalize. Her left ABI improved. She also underwent a left first toe partial ray amputation by Dr. Daylene Katayama 10/26/17 which ultimately healed. Her ABI went from 0.67 up to 1.03. She does have some mild swelling in that leg.

## 2018-02-07 ENCOUNTER — Other Ambulatory Visit: Payer: Self-pay | Admitting: Internal Medicine

## 2018-02-07 DIAGNOSIS — Z1231 Encounter for screening mammogram for malignant neoplasm of breast: Secondary | ICD-10-CM

## 2018-02-11 MED FILL — HYDROCHLOROTHIAZIDE 25 MG T: 25 | 30 days supply | Qty: 30 | Fill #4

## 2018-02-15 MED FILL — GABAPENTIN 300 MG CAPSULE: 300 | 30 days supply | Qty: 180 | Fill #4

## 2018-02-15 MED FILL — TRUE METRIX TEST STRIP: 30 days supply | Qty: 100 | Fill #1

## 2018-02-15 MED FILL — ATORVASTATIN 80 MG TABLET: 80 | 30 days supply | Qty: 30 | Fill #3

## 2018-02-15 MED FILL — POTASSIUM CL 10 MEQ TAB SA: 10 | 30 days supply | Qty: 30 | Fill #1

## 2018-02-19 ENCOUNTER — Ambulatory Visit: Payer: Self-pay | Attending: Internal Medicine | Admitting: *Deleted

## 2018-02-19 DIAGNOSIS — Z111 Encounter for screening for respiratory tuberculosis: Secondary | ICD-10-CM

## 2018-02-19 NOTE — Progress Notes (Signed)
PPD Placement note Katherine [name], 62 y.o. female is here today for placement of PPD test Reason for PPD test: employment Pt taken PPD test before: yes Verified in allergy area and with patient that they are not allergic to the products PPD is made of (Phenol or Tween). Yes Is patient taking any oral or IV steroid medication now or have they taken it in the last month? no Has the patient ever received the BCG vaccine?: no Has the patient been in recent contact with anyone known or suspected of having active TB disease?: no  PPD placed on 02/19/2018 in right forearm.  Patient advised to return for reading within 48-72 hours.

## 2018-02-21 ENCOUNTER — Ambulatory Visit: Payer: Self-pay | Attending: Internal Medicine | Admitting: *Deleted

## 2018-02-21 DIAGNOSIS — Z111 Encounter for screening for respiratory tuberculosis: Secondary | ICD-10-CM | POA: Insufficient documentation

## 2018-02-21 LAB — TB SKIN TEST
Induration: 0 mm
TB Skin Test: NEGATIVE

## 2018-02-21 NOTE — Patient Instructions (Signed)
Patient received a copy of her results in hand

## 2018-03-07 ENCOUNTER — Ambulatory Visit
Admission: RE | Admit: 2018-03-07 | Discharge: 2018-03-07 | Disposition: A | Discharge status: Home / Self Care | Payer: No Typology Code available for payment source | Source: Ambulatory Visit | Attending: Internal Medicine | Admitting: Internal Medicine

## 2018-03-07 DIAGNOSIS — Z1231 Encounter for screening mammogram for malignant neoplasm of breast: Secondary | ICD-10-CM

## 2018-03-28 MED FILL — HYDROCHLOROTHIAZIDE 25 MG T: 25 | 30 days supply | Qty: 30 | Fill #5

## 2018-03-28 MED FILL — LOSARTAN POTASSIUM 100 MG T: 100 | 30 days supply | Qty: 30 | Fill #1

## 2018-03-29 ENCOUNTER — Ambulatory Visit (HOSPITAL_COMMUNITY)
Admission: RE | Admit: 2018-03-29 | Discharge: 2018-03-29 | Disposition: A | Discharge status: Home / Self Care | Payer: Self-pay | Source: Ambulatory Visit | Attending: Cardiology | Admitting: Cardiology

## 2018-03-29 DIAGNOSIS — I10 Essential (primary) hypertension: Secondary | ICD-10-CM | POA: Insufficient documentation

## 2018-03-29 DIAGNOSIS — I998 Other disorder of circulatory system: Secondary | ICD-10-CM | POA: Insufficient documentation

## 2018-03-29 DIAGNOSIS — E119 Type 2 diabetes mellitus without complications: Secondary | ICD-10-CM | POA: Insufficient documentation

## 2018-03-29 DIAGNOSIS — E785 Hyperlipidemia, unspecified: Secondary | ICD-10-CM | POA: Insufficient documentation

## 2018-03-29 DIAGNOSIS — I70229 Atherosclerosis of native arteries of extremities with rest pain, unspecified extremity: Secondary | ICD-10-CM

## 2018-03-29 DIAGNOSIS — Z794 Long term (current) use of insulin: Secondary | ICD-10-CM | POA: Insufficient documentation

## 2018-03-29 DIAGNOSIS — I70202 Unspecified atherosclerosis of native arteries of extremities, left leg: Secondary | ICD-10-CM | POA: Insufficient documentation

## 2018-04-03 ENCOUNTER — Ambulatory Visit (INDEPENDENT_AMBULATORY_CARE_PROVIDER_SITE_OTHER): Payer: Self-pay | Admitting: Endocrinology

## 2018-04-03 ENCOUNTER — Encounter: Payer: Self-pay | Admitting: Endocrinology

## 2018-04-03 VITALS — BP 160/72 | HR 77 | Ht 72.0 in | Wt 229.0 lb

## 2018-04-03 DIAGNOSIS — E1165 Type 2 diabetes mellitus with hyperglycemia: Secondary | ICD-10-CM

## 2018-04-03 DIAGNOSIS — IMO0002 Reserved for concepts with insufficient information to code with codable children: Secondary | ICD-10-CM

## 2018-04-03 DIAGNOSIS — E118 Type 2 diabetes mellitus with unspecified complications: Secondary | ICD-10-CM

## 2018-04-03 LAB — POCT GLYCOSYLATED HEMOGLOBIN (HGB A1C): Hemoglobin A1C: 8.9

## 2018-04-03 MED ORDER — INSULIN ISOPHANE & REGULAR (HUMAN 70-30)100 UNIT/ML KWIKPEN: 160.0000 [IU] | PEN_INJECTOR | Freq: Every day | SUBCUTANEOUS | 11 refills | Status: DC

## 2018-04-03 NOTE — Patient Instructions (Addendum)
Your blood pressure is high today.  Please see your heart doctor soon, to have it rechecked.  check your blood sugar twice a day.  vary the time of day when you check, between before the 3 meals, and at bedtime.  also check if you have symptoms of your blood sugar being too high or too low.  please keep a record of the readings and bring it to your next appointment here (or you can bring the meter itself).  You can write it on any piece of paper.  please call us sooner if your blood sugar goes below 70, or if you have a lot of readings over 200.   I have sent a prescription to your pharmacy, to change to "70/30," 160 units with breakfast. On this type of insulin schedule, you should eat meals on a regular schedule.  If a meal is missed or significantly delayed, your blood sugar could go low.   Please come back for a follow-up appointment in 2 months.

## 2018-04-03 NOTE — Progress Notes (Signed)
Subjective:    Patient ID: Katherine [name], female    DOB: 03/28/1956, 62 y.o.   MRN: [id number]  HPI Pt returns for f/u of diabetes mellitus:  DM type: 1 Dx'ed: 9977 Complications: polyneuropathy, gastroparesis, foot ulcer, renal insuff, PAD, and DR.  Therapy: insulin since 2014 GDM: never DKA: once (2016) Severe hypoglycemia: never.   Pancreatitis: never Pancreatic imaging: normal on 2016 CT.   Other: she gets insulin through [facility]; she declines multiple daily injections; she did not tolerate levemir (rash); she was changed to NPH qam, due to the pattern of her cbg's.   Interval history: pt says she never misses the insulin.  no cbg record, but states cbg's are often low in the middle of the night.  She takes just 160 units, out of fear of hypoglycemia.  Past Medical History:  Diagnosis Date  . Anemia    requiring transfusion  . Boil of buttock ~ 2016  . Depression   . Diabetic neuropathy ([place])    Archie Endo 08/23/2017  . DKA (diabetic ketoacidoses) ([place])    recent/notes 01/06/2015  . Hyperlipidemia   . Hypertension   . Neuropathy   . Peripheral vascular disease ([place])   . Toe ulcer ([place])    left great toe/notes 08/23/2017  . Type II diabetes mellitus ([place])     Past Surgical History:  Procedure Laterality Date  . AMPUTATION TOE Left 10/26/2017   Procedure: AMPUTATION PARTIAL RAY LEFT FOOT AND IRRIGATION/DEBRIDEMENT;  Surgeon: Edrick Kins, DPM;  Location: [facility];  Service: Podiatry;  Laterality: Left;  AMPUTATION PARTIAL RAY LEFT FOOT AND IRRIGATION/DEBRIDEMENT  . CHOLECYSTECTOMY  01/24/2003   Archie Endo 04/04/2011  . COLONOSCOPY    . INCISION AND DRAINAGE ABSCESS  03/17/2012   "right but; left lower abdoment"/notes 03/17/2012  . LOWER EXTREMITY INTERVENTION N/A 09/10/2017   Procedure: LOWER EXTREMITY INTERVENTION;  Surgeon: Lorretta Harp, MD;  Location: [facility];  Service: Cardiovascular;  Laterality: N/A;  . PERIPHERAL VASCULAR ATHERECTOMY  09/10/2017   Procedure:  PERIPHERAL VASCULAR ATHERECTOMY;  Surgeon: Lorretta Harp, MD;  Location: [place] [facility];  Service: Cardiovascular;;  left AT    Social History   Socioeconomic History  . Marital status: Married    Spouse name: Not on file  . Number of children: Not on file  . Years of education: Not on file  . Highest education level: Not on file  Occupational History  . Not on file  Social Needs  . Financial resource strain: Not on file  . Food insecurity:    Worry: Not on file    Inability: Not on file  . Transportation needs:    Medical: Not on file    Non-medical: Not on file  Tobacco Use  . Smoking status: Never Smoker  . Smokeless tobacco: Never Used  Substance and Sexual Activity  . Alcohol use: No  . Drug use: No  . Sexual activity: Never  Lifestyle  . Physical activity:    Days per week: Not on file    Minutes per session: Not on file  . Stress: Not on file  Relationships  . Social connections:    Talks on phone: Not on file    Gets together: Not on file    Attends religious service: Not on file    Active member of club or organization: Not on file    Attends meetings of clubs or organizations: Not on file    Relationship status: Not on file  .  Intimate partner violence:    Fear of current or ex partner: Not on file    Emotionally abused: Not on file    Physically abused: Not on file    Forced sexual activity: Not on file  Other Topics Concern  . Not on file  Social History Narrative  . Not on file    Current Outpatient Medications on File Prior to Visit  Medication Sig Dispense Refill  . amLODipine (NORVASC) 10 MG tablet Take 1 tablet (10 mg total) by mouth daily. 90 tablet 3  . atorvastatin (LIPITOR) 80 MG tablet Take 1 tablet (80 mg total) by mouth daily at 6 PM. 30 tablet 6  . Blood Glucose Monitoring Suppl (TRUE METRIX METER) w/Device KIT Use as directed 1 kit 0  . clopidogrel (PLAVIX) 75 MG tablet Take 1 tablet (75 mg total) by mouth daily with  breakfast. 90 tablet 1  . docusate sodium (COLACE) 100 MG capsule Take 1 capsule (100 mg total) by mouth daily. (Patient taking differently: Take 100 mg by mouth daily as needed for mild constipation. ) 30 capsule 4  . gabapentin (NEURONTIN) 300 MG capsule Take 2 capsules (600 mg total) by mouth 3 (three) times daily. 180 capsule 5  . glucose blood (TRUE METRIX BLOOD GLUCOSE TEST) test strip Use as instructed 100 each 12  . hydrALAZINE (APRESOLINE) 100 MG tablet Take 1 tablet (100 mg total) by mouth 3 (three) times daily. 90 tablet 2  . hydrochlorothiazide (HYDRODIURIL) 25 MG tablet Take 1 tablet (25 mg total) by mouth daily. 30 tablet 6  . loratadine (CLARITIN) 10 MG tablet Take 1 tablet (10 mg total) by mouth daily. 30 tablet 11  . losartan (COZAAR) 100 MG tablet Take 1 tablet (100 mg total) by mouth daily. 30 tablet 6  . Multiple Vitamins-Minerals (MULTIVITAMIN ADULT) CHEW Chew 1 tablet by mouth daily.    . ondansetron (ZOFRAN) 4 MG tablet Take 1 tablet (4 mg total) by mouth every 8 (eight) hours as needed for nausea or vomiting. 20 tablet 0  . potassium chloride (K-DUR) 10 MEQ tablet Take 1 tablet (10 mEq total) by mouth daily. 30 tablet 6  . TRUEPLUS LANCETS 28G MISC Use as directed 100 each 12   No current facility-administered medications on file prior to visit.     Allergies  Allergen Reactions  . Other Swelling    Seaweed= swelling on arms, hands and face  . Metformin And Related Nausea Only    Family History  Problem Relation Age of Onset  . Diabetes Father   . Hypertension Father   . Kidney disease Mother   . Hypertension Mother   . Cancer Maternal Grandmother   . Hypertension Maternal Grandfather   . Diabetes Paternal Aunt   . Diabetes Paternal Uncle   . Diabetes Paternal Grandfather   . Colon cancer Neg Hx   . Breast cancer Neg Hx     BP (!) 160/72 (BP Location: Left Arm, Patient Position: Sitting, Cuff Size: Large)   Pulse 77   Ht 6' (1.829 m)   Wt 229 lb (103.9  kg)   SpO2 97%   BMI 31.06 kg/m   Review of Systems Denies LOC.     Objective:   Physical Exam VITAL SIGNS:  See vs page GENERAL: no distress Pulses: dorsalis pedis absent bilat (poss due to edema).  MSK: no deformity of the feet, except left great toe is absent CV: 1+ bilat leg edema Skin:  no ulcer on the feet.  normal color and temp on the feet.  Neuro: sensation is intact to touch on the feet, but decreased from normal.     Lab Results  Component Value Date   HGBA1C 8.9 04/03/2018       Assessment & Plan:  Type 1 DM, with PAD: worse.  Noncompliance with cbg recording.  He is not a candidate for multiple daily injections Hypoglycemia: this is limiting dosage.  The pattern of his cbg's indicates she needs a faster-acting qd insulin. HTN: is noted today.  Patient Instructions  Your blood pressure is high today.  Please see your heart doctor soon, to have it rechecked.  check your blood sugar twice a day.  vary the time of day when you check, between before the 3 meals, and at bedtime.  also check if you have symptoms of your blood sugar being too high or too low.  please keep a record of the readings and bring it to your next appointment here (or you can bring the meter itself).  You can write it on any piece of paper.  please call us sooner if your blood sugar goes below 70, or if you have a lot of readings over 200.   I have sent a prescription to your pharmacy, to change to "70/30," 160 units with breakfast. On this type of insulin schedule, you should eat meals on a regular schedule.  If a meal is missed or significantly delayed, your blood sugar could go low.   Please come back for a follow-up appointment in 2 months.

## 2018-04-11 LAB — HM DIABETES EYE EXAM

## 2018-04-11 MED FILL — !HUMULIN 70/30 KWIKPEN: (70-30) 100 | 30 days supply | Qty: 48 | Fill #0

## 2018-04-11 MED FILL — POTASSIUM CL 10 MEQ TAB SA: 10 | 30 days supply | Qty: 30 | Fill #2

## 2018-04-26 ENCOUNTER — Other Ambulatory Visit: Payer: Self-pay | Admitting: *Deleted

## 2018-04-26 DIAGNOSIS — I70229 Atherosclerosis of native arteries of extremities with rest pain, unspecified extremity: Secondary | ICD-10-CM

## 2018-04-26 DIAGNOSIS — I998 Other disorder of circulatory system: Secondary | ICD-10-CM

## 2018-05-02 ENCOUNTER — Other Ambulatory Visit: Payer: Self-pay | Admitting: Internal Medicine

## 2018-05-02 DIAGNOSIS — E1142 Type 2 diabetes mellitus with diabetic polyneuropathy: Secondary | ICD-10-CM

## 2018-05-02 MED FILL — GABAPENTIN 300 MG CAPSULE: 300 | 30 days supply | Qty: 180 | Fill #5

## 2018-05-14 ENCOUNTER — Encounter: Payer: Self-pay | Admitting: Internal Medicine

## 2018-05-14 ENCOUNTER — Ambulatory Visit: Payer: Self-pay | Attending: Internal Medicine | Admitting: Internal Medicine

## 2018-05-14 VITALS — BP 126/70 | HR 79 | Temp 99.2°F | Resp 16 | Wt 229.8 lb

## 2018-05-14 DIAGNOSIS — M7989 Other specified soft tissue disorders: Secondary | ICD-10-CM | POA: Insufficient documentation

## 2018-05-14 DIAGNOSIS — Z888 Allergy status to other drugs, medicaments and biological substances status: Secondary | ICD-10-CM | POA: Insufficient documentation

## 2018-05-14 DIAGNOSIS — E1165 Type 2 diabetes mellitus with hyperglycemia: Secondary | ICD-10-CM | POA: Insufficient documentation

## 2018-05-14 DIAGNOSIS — Z9049 Acquired absence of other specified parts of digestive tract: Secondary | ICD-10-CM | POA: Insufficient documentation

## 2018-05-14 DIAGNOSIS — L602 Onychogryphosis: Secondary | ICD-10-CM

## 2018-05-14 DIAGNOSIS — Z7902 Long term (current) use of antithrombotics/antiplatelets: Secondary | ICD-10-CM | POA: Insufficient documentation

## 2018-05-14 DIAGNOSIS — M79671 Pain in right foot: Secondary | ICD-10-CM | POA: Insufficient documentation

## 2018-05-14 DIAGNOSIS — M79604 Pain in right leg: Secondary | ICD-10-CM | POA: Insufficient documentation

## 2018-05-14 DIAGNOSIS — E118 Type 2 diabetes mellitus with unspecified complications: Secondary | ICD-10-CM

## 2018-05-14 DIAGNOSIS — M79672 Pain in left foot: Secondary | ICD-10-CM | POA: Insufficient documentation

## 2018-05-14 DIAGNOSIS — E1142 Type 2 diabetes mellitus with diabetic polyneuropathy: Secondary | ICD-10-CM | POA: Insufficient documentation

## 2018-05-14 DIAGNOSIS — Z794 Long term (current) use of insulin: Secondary | ICD-10-CM | POA: Insufficient documentation

## 2018-05-14 DIAGNOSIS — Z9889 Other specified postprocedural states: Secondary | ICD-10-CM | POA: Insufficient documentation

## 2018-05-14 DIAGNOSIS — F329 Major depressive disorder, single episode, unspecified: Secondary | ICD-10-CM | POA: Insufficient documentation

## 2018-05-14 DIAGNOSIS — Z841 Family history of disorders of kidney and ureter: Secondary | ICD-10-CM | POA: Insufficient documentation

## 2018-05-14 DIAGNOSIS — I10 Essential (primary) hypertension: Secondary | ICD-10-CM | POA: Insufficient documentation

## 2018-05-14 DIAGNOSIS — R600 Localized edema: Secondary | ICD-10-CM | POA: Insufficient documentation

## 2018-05-14 DIAGNOSIS — M79605 Pain in left leg: Secondary | ICD-10-CM | POA: Insufficient documentation

## 2018-05-14 DIAGNOSIS — Z8249 Family history of ischemic heart disease and other diseases of the circulatory system: Secondary | ICD-10-CM | POA: Insufficient documentation

## 2018-05-14 DIAGNOSIS — E11319 Type 2 diabetes mellitus with unspecified diabetic retinopathy without macular edema: Secondary | ICD-10-CM | POA: Insufficient documentation

## 2018-05-14 DIAGNOSIS — Z833 Family history of diabetes mellitus: Secondary | ICD-10-CM | POA: Insufficient documentation

## 2018-05-14 DIAGNOSIS — Z89412 Acquired absence of left great toe: Secondary | ICD-10-CM | POA: Insufficient documentation

## 2018-05-14 DIAGNOSIS — IMO0002 Reserved for concepts with insufficient information to code with codable children: Secondary | ICD-10-CM

## 2018-05-14 DIAGNOSIS — K219 Gastro-esophageal reflux disease without esophagitis: Secondary | ICD-10-CM | POA: Insufficient documentation

## 2018-05-14 DIAGNOSIS — Z79899 Other long term (current) drug therapy: Secondary | ICD-10-CM | POA: Insufficient documentation

## 2018-05-14 DIAGNOSIS — E785 Hyperlipidemia, unspecified: Secondary | ICD-10-CM | POA: Insufficient documentation

## 2018-05-14 LAB — GLUCOSE, POCT (MANUAL RESULT ENTRY): POC Glucose: 287 mg/dl — AB (ref 70–99)

## 2018-05-14 MED ORDER — CARVEDILOL 3.125 MG PO TABS: 3.1250 mg | ORAL_TABLET | Freq: Two times a day (BID) | ORAL | 3 refills | Status: DC

## 2018-05-14 MED ORDER — GABAPENTIN 300 MG PO CAPS: ORAL_CAPSULE | ORAL | 5 refills | Status: DC

## 2018-05-14 NOTE — Progress Notes (Signed)
Patient ID: Katherine [name], female    DOB: 29-Dec-1955  MRN: [id number]  CC: Diabetes and Hypertension   Subjective: Katherine [name] is a 62 y.o. female who presents for chronic ds management. Her concerns today include:  Hx of HTN, DM with neuropathy, retinopathyand possible gastroparesis, HL, depression, GERD, PAD, s/p amputation LT 1st toe.  HTN: Compliance with current medications including hydralazine, HCTZ, Cozaar and Norvasc She limits salt in the foods.  Main complaint today is pain in the legs and feet with left being greater than the right.  Started having some swelling again in the left lower extremity.  She tries to keep the left leg elevated but it takes about 2 hours before the swelling goes down even with elevation.  No PND orthopnea.  No shortness of breath on ambulation. Purchased compression socks but they are too tight so she does not wear them Pain in feet worse at nights.  Has phantom pain where LT big toe was amputated.  She still avoids looking at the amputation area which has healed.  "Ijust don't want to see it."   Husband was taking care of her feet it but he recently had MI  -still gets swelling in feet and ankle  DM: seeing Dr. Loanne Drilling.  Was recently changed to 70/30 insulin with instructions of 160 units in the mornings.  She states that she was just finally able to get this through our pharmacy.  Blood sugars were running high up to this point.  Patient Active Problem List   Diagnosis Date Noted  . Thyroid nodule 10/04/2017  . Microcytic anemia 09/11/2017  . Critical lower limb ischemia 09/10/2017  . Retinopathy 08/22/2017  . Thyroid enlargement 07/12/2017  . Dyslipidemia 09/20/2016  . DEPRESSION 09/10/2009  . LEG CRAMPS 05/19/2009  . GERD 02/15/2009  . Gastroparesis 09/03/2008  . DENTAL CARIES 07/14/2008  . Diabetes mellitus type 2 with complications, uncontrolled ([place]) 09/16/2003  . Essential hypertension 09/16/2003     Current Outpatient  Medications on File Prior to Visit  Medication Sig Dispense Refill  . atorvastatin (LIPITOR) 80 MG tablet Take 1 tablet (80 mg total) by mouth daily at 6 PM. 30 tablet 6  . Blood Glucose Monitoring Suppl (TRUE METRIX METER) w/Device KIT Use as directed 1 kit 0  . clopidogrel (PLAVIX) 75 MG tablet Take 1 tablet (75 mg total) by mouth daily with breakfast. 90 tablet 1  . docusate sodium (COLACE) 100 MG capsule Take 1 capsule (100 mg total) by mouth daily. (Patient taking differently: Take 100 mg by mouth daily as needed for mild constipation. ) 30 capsule 4  . glucose blood (TRUE METRIX BLOOD GLUCOSE TEST) test strip Use as instructed 100 each 12  . hydrALAZINE (APRESOLINE) 100 MG tablet Take 1 tablet (100 mg total) by mouth 3 (three) times daily. 90 tablet 2  . hydrochlorothiazide (HYDRODIURIL) 25 MG tablet Take 1 tablet (25 mg total) by mouth daily. 30 tablet 6  . Insulin Isophane & Regular Human (HUMULIN 70/30 KWIKPEN) (70-30) 100 UNIT/ML PEN Inject 160 Units into the skin daily with breakfast. And pen needles 2/day 20 pen 11  . loratadine (CLARITIN) 10 MG tablet Take 1 tablet (10 mg total) by mouth daily. 30 tablet 11  . losartan (COZAAR) 100 MG tablet Take 1 tablet (100 mg total) by mouth daily. 30 tablet 6  . Multiple Vitamins-Minerals (MULTIVITAMIN ADULT) CHEW Chew 1 tablet by mouth daily.    . ondansetron (ZOFRAN) 4 MG tablet Take 1 tablet (  4 mg total) by mouth every 8 (eight) hours as needed for nausea or vomiting. 20 tablet 0  . potassium chloride (K-DUR) 10 MEQ tablet Take 1 tablet (10 mEq total) by mouth daily. 30 tablet 6  . TRUEPLUS LANCETS 28G MISC Use as directed 100 each 12   No current facility-administered medications on file prior to visit.     Allergies  Allergen Reactions  . Other Swelling    Seaweed= swelling on arms, hands and face  . Metformin And Related Nausea Only    Social History   Socioeconomic History  . Marital status: Married    Spouse name: Not on file    . Number of children: Not on file  . Years of education: Not on file  . Highest education level: Not on file  Occupational History  . Not on file  Social Needs  . Financial resource strain: Not on file  . Food insecurity:    Worry: Not on file    Inability: Not on file  . Transportation needs:    Medical: Not on file    Non-medical: Not on file  Tobacco Use  . Smoking status: Never Smoker  . Smokeless tobacco: Never Used  Substance and Sexual Activity  . Alcohol use: No  . Drug use: No  . Sexual activity: Never  Lifestyle  . Physical activity:    Days per week: Not on file    Minutes per session: Not on file  . Stress: Not on file  Relationships  . Social connections:    Talks on phone: Not on file    Gets together: Not on file    Attends religious service: Not on file    Active member of club or organization: Not on file    Attends meetings of clubs or organizations: Not on file    Relationship status: Not on file  . Intimate partner violence:    Fear of current or ex partner: Not on file    Emotionally abused: Not on file    Physically abused: Not on file    Forced sexual activity: Not on file  Other Topics Concern  . Not on file  Social History Narrative  . Not on file    Family History  Problem Relation Age of Onset  . Diabetes Father   . Hypertension Father   . Kidney disease Mother   . Hypertension Mother   . Cancer Maternal Grandmother   . Hypertension Maternal Grandfather   . Diabetes Paternal Aunt   . Diabetes Paternal Uncle   . Diabetes Paternal Grandfather   . Colon cancer Neg Hx   . Breast cancer Neg Hx     Past Surgical History:  Procedure Laterality Date  . AMPUTATION TOE Left 10/26/2017   Procedure: AMPUTATION PARTIAL RAY LEFT FOOT AND IRRIGATION/DEBRIDEMENT;  Surgeon: Edrick Kins, DPM;  Location: [facility];  Service: Podiatry;  Laterality: Left;  AMPUTATION PARTIAL RAY LEFT FOOT AND IRRIGATION/DEBRIDEMENT  . CHOLECYSTECTOMY  01/24/2003    Archie Endo 04/04/2011  . COLONOSCOPY    . INCISION AND DRAINAGE ABSCESS  03/17/2012   "right but; left lower abdoment"/notes 03/17/2012  . LOWER EXTREMITY INTERVENTION N/A 09/10/2017   Procedure: LOWER EXTREMITY INTERVENTION;  Surgeon: Lorretta Harp, MD;  Location: [facility];  Service: Cardiovascular;  Laterality: N/A;  . PERIPHERAL VASCULAR ATHERECTOMY  09/10/2017   Procedure: PERIPHERAL VASCULAR ATHERECTOMY;  Surgeon: Lorretta Harp, MD;  Location: [facility];  Service: Cardiovascular;;  left  AT    ROS: Review of Systems Negative except as stated above  PHYSICAL EXAM: BP 126/70   Pulse 79   Temp 99.2 F (37.3 C) (Oral)   Resp 16   Wt 229 lb 12.8 oz (104.2 kg)   SpO2 97%   BMI 31.17 kg/m   Physical Exam  General appearance - alert, well appearing, and in no distress Mental status - normal mood, behavior, speech, dress, motor activity, and thought processes Neck - supple, no significant adenopathy Chest - clear to auscultation, no wheezes, rales or rhonchi, symmetric air entry Heart - normal rate, regular rhythm, normal S1, S2, no murmurs, rubs, clicks or gallops Extremities -trace to 1+ lower extremity edema left greater than right.  1+ bilateral ankle edema Diabetic Foot Exam - Simple   Simple Foot Form Visual Inspection See comments:  Yes Sensation Testing See comments:  Yes Pulse Check Posterior Tibialis and Dorsalis pulse intact bilaterally:  Yes Comments Patient is flat-footed.  She has well-healed site on the left foot from amputation of the big toe.  She has decreased sensation on the plantar surface of both feet on leap exam.  Toenails are thick and overgrown.     Results for orders placed or performed in visit on 05/14/18  POCT glucose (manual entry)  Result Value Ref Range   POC Glucose 287 (A) 70 - 99 mg/dl    Lab Results  Component Value Date   HGBA1C 8.9 04/03/2018     Chemistry      Component Value Date/Time   NA 142  10/23/2017 1452   K 4.1 10/23/2017 1452   CL 97 10/23/2017 1452   CO2 29 10/23/2017 1452   BUN 13 10/23/2017 1452   CREATININE 1.10 (H) 10/23/2017 1452   CREATININE 0.88 09/29/2015 1712      Component Value Date/Time   CALCIUM 9.4 10/23/2017 1452   ALKPHOS 127 (H) 10/23/2017 1452   AST 13 10/23/2017 1452   ALT 10 10/23/2017 1452   BILITOT <0.2 10/23/2017 1452      ASSESSMENT AND PLAN: 1. Essential hypertension At goal.  However given the lower extremity edema of which Norvasc and gabapentin can contribute, we will change the Norvasc to carvedilol.  Bring her back in about 4 weeks to see whether this change has made a difference in terms of decreasing the lower extremity edema.  The - carvedilol (COREG) 3.125 MG tablet; Take 1 tablet (3.125 mg total) by mouth 2 (two) times daily with a meal.  Dispense: 60 tablet; Refill: 3  2. Diabetic polyneuropathy associated with type 2 diabetes mellitus (HCC) Symptoms are worse at nights, so we increased the dose of gabapentin in the p.m. from 600 to 900 mg - gabapentin (NEURONTIN) 300 MG capsule; 2 caps PO Q a.m and 2 tabs PO Q afternoon and 3 tabs Q p.m  Dispense: 210 capsule; Refill: 5  3. Diabetes mellitus type 2 with complications, uncontrolled (HCC) No changes made to insulin today since she is just started taking the 70/30 insulin - POCT glucose (manual entry)  4. Overgrown toenails - Ambulatory referral to Podiatry  5. Edema of both legs See #1 above   Patient was given the opportunity to ask questions.  Patient verbalized understanding of the plan and was able to repeat key elements of the plan.   Orders Placed This Encounter  Procedures  . Ambulatory referral to Podiatry  . POCT glucose (manual entry)     Requested Prescriptions   Signed Prescriptions Disp  Refills  . carvedilol (COREG) 3.125 MG tablet 60 tablet 3    Sig: Take 1 tablet (3.125 mg total) by mouth 2 (two) times daily with a meal.  . gabapentin (NEURONTIN)  300 MG capsule 210 capsule 5    Sig: 2 caps PO Q a.m and 2 tabs PO Q afternoon and 3 tabs Q p.m    Return in about 1 month (around 06/13/2018).  Karle Plumber, MD, FACP

## 2018-05-14 NOTE — Patient Instructions (Signed)
Stop Amlodipine. Start Carvedilol twice a day instead.  Increase Gabapentin to 600 mg in mornings and afternoons and 900 mg at nights.

## 2018-05-22 MED FILL — HYDROCHLOROTHIAZIDE 25 MG T: 25 | 30 days supply | Qty: 30 | Fill #6

## 2018-05-24 ENCOUNTER — Encounter (HOSPITAL_COMMUNITY): Payer: Self-pay | Admitting: Emergency Medicine

## 2018-05-24 ENCOUNTER — Other Ambulatory Visit: Payer: Self-pay

## 2018-05-24 ENCOUNTER — Emergency Department (HOSPITAL_COMMUNITY)
Admission: EM | Admit: 2018-05-24 | Discharge: 2018-05-24 | Disposition: A | Discharge status: Home / Self Care | Payer: No Typology Code available for payment source | Attending: Emergency Medicine | Admitting: Emergency Medicine

## 2018-05-24 DIAGNOSIS — F329 Major depressive disorder, single episode, unspecified: Secondary | ICD-10-CM | POA: Insufficient documentation

## 2018-05-24 DIAGNOSIS — Y9389 Activity, other specified: Secondary | ICD-10-CM | POA: Insufficient documentation

## 2018-05-24 DIAGNOSIS — Y929 Unspecified place or not applicable: Secondary | ICD-10-CM | POA: Insufficient documentation

## 2018-05-24 DIAGNOSIS — W57XXXA Bitten or stung by nonvenomous insect and other nonvenomous arthropods, initial encounter: Secondary | ICD-10-CM | POA: Insufficient documentation

## 2018-05-24 DIAGNOSIS — R112 Nausea with vomiting, unspecified: Secondary | ICD-10-CM

## 2018-05-24 DIAGNOSIS — Y998 Other external cause status: Secondary | ICD-10-CM | POA: Insufficient documentation

## 2018-05-24 DIAGNOSIS — Z794 Long term (current) use of insulin: Secondary | ICD-10-CM | POA: Insufficient documentation

## 2018-05-24 DIAGNOSIS — Z7902 Long term (current) use of antithrombotics/antiplatelets: Secondary | ICD-10-CM | POA: Insufficient documentation

## 2018-05-24 DIAGNOSIS — M7918 Myalgia, other site: Secondary | ICD-10-CM | POA: Insufficient documentation

## 2018-05-24 DIAGNOSIS — S20469A Insect bite (nonvenomous) of unspecified back wall of thorax, initial encounter: Secondary | ICD-10-CM | POA: Insufficient documentation

## 2018-05-24 DIAGNOSIS — I10 Essential (primary) hypertension: Secondary | ICD-10-CM | POA: Insufficient documentation

## 2018-05-24 DIAGNOSIS — E119 Type 2 diabetes mellitus without complications: Secondary | ICD-10-CM | POA: Insufficient documentation

## 2018-05-24 DIAGNOSIS — Z79899 Other long term (current) drug therapy: Secondary | ICD-10-CM | POA: Insufficient documentation

## 2018-05-24 DIAGNOSIS — Z9049 Acquired absence of other specified parts of digestive tract: Secondary | ICD-10-CM | POA: Insufficient documentation

## 2018-05-24 DIAGNOSIS — M791 Myalgia, unspecified site: Secondary | ICD-10-CM

## 2018-05-24 LAB — CBC WITH DIFFERENTIAL/PLATELET
Abs Immature Granulocytes: 0 10*3/uL (ref 0.0–0.1)
BASOS ABS: 0 10*3/uL (ref 0.0–0.1)
Basophils Relative: 1 %
EOS ABS: 0.1 10*3/uL (ref 0.0–0.7)
EOS PCT: 2 %
HEMATOCRIT: 42.5 % (ref 36.0–46.0)
Hemoglobin: 13.1 g/dL (ref 12.0–15.0)
Immature Granulocytes: 0 %
Lymphocytes Relative: 34 %
Lymphs Abs: 1.9 10*3/uL (ref 0.7–4.0)
MCH: 27.3 pg (ref 26.0–34.0)
MCHC: 30.8 g/dL (ref 30.0–36.0)
MCV: 88.5 fL (ref 78.0–100.0)
Monocytes Absolute: 0.4 10*3/uL (ref 0.1–1.0)
Monocytes Relative: 8 %
NEUTROS ABS: 3.2 10*3/uL (ref 1.7–7.7)
NEUTROS PCT: 55 %
Platelets: 230 10*3/uL (ref 150–400)
RBC: 4.8 MIL/uL (ref 3.87–5.11)
RDW: 13.2 % (ref 11.5–15.5)
WBC: 5.8 10*3/uL (ref 4.0–10.5)

## 2018-05-24 LAB — COMPREHENSIVE METABOLIC PANEL
ALT: 13 U/L (ref 0–44)
ANION GAP: 8 (ref 5–15)
AST: 14 U/L — ABNORMAL LOW (ref 15–41)
Albumin: 3.5 g/dL (ref 3.5–5.0)
Alkaline Phosphatase: 104 U/L (ref 38–126)
BILIRUBIN TOTAL: 0.7 mg/dL (ref 0.3–1.2)
BUN: 16 mg/dL (ref 8–23)
CO2: 31 mmol/L (ref 22–32)
Calcium: 9.1 mg/dL (ref 8.9–10.3)
Chloride: 102 mmol/L (ref 98–111)
Creatinine, Ser: 0.99 mg/dL (ref 0.44–1.00)
GFR calc non Af Amer: 60 mL/min — ABNORMAL LOW (ref >60)
Glucose, Bld: 103 mg/dL — ABNORMAL HIGH (ref 70–99)
POTASSIUM: 3.8 mmol/L (ref 3.5–5.1)
SODIUM: 141 mmol/L (ref 135–145)
TOTAL PROTEIN: 6.8 g/dL (ref 6.5–8.1)

## 2018-05-24 LAB — CBG MONITORING, ED: GLUCOSE-CAPILLARY: 103 mg/dL — AB (ref 70–99)

## 2018-05-24 LAB — LIPASE, BLOOD: Lipase: 23 U/L (ref 11–51)

## 2018-05-24 MED ORDER — ONDANSETRON HCL 4 MG/2ML IJ SOLN
4.0000 mg | Freq: Once | INTRAMUSCULAR | Status: AC
  Administered 2018-05-24: 4 mg via INTRAVENOUS
  Filled 2018-05-24: qty 2

## 2018-05-24 MED ORDER — DOXYCYCLINE HYCLATE 100 MG PO CAPS: 100.0000 mg | ORAL_CAPSULE | Freq: Two times a day (BID) | ORAL | 0 refills | Status: DC

## 2018-05-24 MED ORDER — ONDANSETRON 4 MG PO TBDP: ORAL_TABLET | ORAL | 0 refills | Status: DC

## 2018-05-24 MED ORDER — SODIUM CHLORIDE 0.9 % IV BOLUS
500.0000 mL | Freq: Once | INTRAVENOUS | Status: AC
  Administered 2018-05-24: 500 mL via INTRAVENOUS

## 2018-05-24 MED ORDER — DOXYCYCLINE HYCLATE 100 MG PO TABS: 100.0000 mg | ORAL_TABLET | Freq: Once | ORAL | Status: DC

## 2018-05-24 NOTE — ED Triage Notes (Signed)
Pt. Stated, I got bit by a spider 3 weeks ago and its still there. It itches every night and getting painful

## 2018-05-24 NOTE — Discharge Instructions (Signed)
You have tickborne illness lab testing pending and will be contacted with any positive results.  Please take entire course of antibiotics as prescribed.  You may use Zofran as needed for nausea and vomiting, please start with clear liquids and advance her diet slowly as tolerated.  Return to the emergency department if you develop fevers, rash, unable to keep down fluids, focal abdominal pain or any other new or concerning symptoms.  Your blood pressure was elevated today, please follow up with your primary doctor in 1 week for blood pressure recheck.

## 2018-05-24 NOTE — ED Provider Notes (Signed)
[facility] EMERGENCY DEPARTMENT Provider Note   CSN: [id number] Arrival date & time: 05/24/18  1013     History   Chief Complaint Chief Complaint  Patient presents with  . Insect Bite    HPI Katherine [name] is a 62 y.o. female.  Katherine [name] is a 62 y.o. Female with a history of diabetes, hypertension, hyperlipidemia, neuropathy, anemia and depression, presents to the emergency department for evaluation of tick bite as well as nausea and vomiting.  Patient reports approximately 3 weeks ago her husband found a tick over her mid low back and removed it she reports she is continued to have some pain and itching immediately surrounding the area, thinks that her husband got all parts of the tick out but is not 100% sure, she reports small amount of redness over this area but has not noticed any rash.  She reports over the past few days she is also developed generalized myalgias, nausea and has had a few episodes of nonbloody emesis.  She denies fevers or chills, no abdominal pain, chest pain or shortness of breath.  Patient reports with these new symptoms she is continued to have itching and discomfort over the area where the tick bite occurred.  Denies headaches or neck stiffness.  Has not noticed any swollen lymph nodes.     Past Medical History:  Diagnosis Date  . Anemia    requiring transfusion  . Boil of buttock ~ 2016  . Depression   . Diabetic neuropathy ([place])    Archie Endo 08/23/2017  . DKA (diabetic ketoacidoses) ([place])    recent/notes 01/06/2015  . Hyperlipidemia   . Hypertension   . Neuropathy   . Peripheral vascular disease ([place])   . Toe ulcer ([place])    left great toe/notes 08/23/2017  . Type II diabetes mellitus [facility])     Patient Active Problem List   Diagnosis Date Noted  . Diabetic polyneuropathy associated with type 2 diabetes mellitus ([place]) 05/14/2018  . Thyroid nodule 10/04/2017  . Microcytic anemia 09/11/2017  . Critical lower limb ischemia  09/10/2017  . Retinopathy 08/22/2017  . Thyroid enlargement 07/12/2017  . Dyslipidemia 09/20/2016  . DEPRESSION 09/10/2009  . LEG CRAMPS 05/19/2009  . GERD 02/15/2009  . Gastroparesis 09/03/2008  . DENTAL CARIES 07/14/2008  . Diabetes mellitus type 2 with complications, uncontrolled ([place]) 09/16/2003  . Essential hypertension 09/16/2003    Past Surgical History:  Procedure Laterality Date  . AMPUTATION TOE Left 10/26/2017   Procedure: AMPUTATION PARTIAL RAY LEFT FOOT AND IRRIGATION/DEBRIDEMENT;  Surgeon: Edrick Kins, DPM;  Location: [place];  Service: Podiatry;  Laterality: Left;  AMPUTATION PARTIAL RAY LEFT FOOT AND IRRIGATION/DEBRIDEMENT  . CHOLECYSTECTOMY  01/24/2003   Archie Endo 04/04/2011  . COLONOSCOPY    . INCISION AND DRAINAGE ABSCESS  03/17/2012   "right but; left lower abdoment"/notes 03/17/2012  . LOWER EXTREMITY INTERVENTION N/A 09/10/2017   Procedure: LOWER EXTREMITY INTERVENTION;  Surgeon: Lorretta Harp, MD;  Location: [facility];  Service: Cardiovascular;  Laterality: N/A;  . PERIPHERAL VASCULAR ATHERECTOMY  09/10/2017   Procedure: PERIPHERAL VASCULAR ATHERECTOMY;  Surgeon: Lorretta Harp, MD;  Location: [facility];  Service: Cardiovascular;;  left AT     OB History   None      Home Medications    Prior to Admission medications   Medication Sig Start Date End Date Taking? Authorizing Provider  atorvastatin (LIPITOR) 80 MG tablet Take 1 tablet (80 mg total) by mouth  daily at 6 PM. 10/31/17   Lorretta Harp, MD  Blood Glucose Monitoring Suppl (TRUE METRIX METER) w/Device KIT Use as directed 06/07/17   Ladell Pier, MD  carvedilol (COREG) 3.125 MG tablet Take 1 tablet (3.125 mg total) by mouth 2 (two) times daily with a meal. 05/14/18   Ladell Pier, MD  clopidogrel (PLAVIX) 75 MG tablet Take 1 tablet (75 mg total) by mouth daily with breakfast. 01/10/18   Lorretta Harp, MD  docusate sodium (COLACE) 100 MG capsule Take 1 capsule  (100 mg total) by mouth daily. Patient taking differently: Take 100 mg by mouth daily as needed for mild constipation.  09/02/17   Ladell Pier, MD  doxycycline (VIBRAMYCIN) 100 MG capsule Take 1 capsule (100 mg total) by mouth 2 (two) times daily. 05/24/18   Jacqlyn Larsen, PA-C  gabapentin (NEURONTIN) 300 MG capsule 2 caps PO Q a.m and 2 tabs PO Q afternoon and 3 tabs Q p.m 05/14/18   Ladell Pier, MD  glucose blood (TRUE METRIX BLOOD GLUCOSE TEST) test strip Use as instructed 06/07/17   Ladell Pier, MD  hydrALAZINE (APRESOLINE) 100 MG tablet Take 1 tablet (100 mg total) by mouth 3 (three) times daily. 02/13/17   Maren Reamer, MD  hydrochlorothiazide (HYDRODIURIL) 25 MG tablet Take 1 tablet (25 mg total) by mouth daily. 07/12/17   Ladell Pier, MD  Insulin Isophane & Regular Human (HUMULIN 70/30 KWIKPEN) (70-30) 100 UNIT/ML PEN Inject 160 Units into the skin daily with breakfast. And pen needles 2/day 04/03/18   Renato Shin, MD  loratadine (CLARITIN) 10 MG tablet Take 1 tablet (10 mg total) by mouth daily. 12/17/17   Ladell Pier, MD  losartan (COZAAR) 100 MG tablet Take 1 tablet (100 mg total) by mouth daily. 02/05/18   Lorretta Harp, MD  Multiple Vitamins-Minerals (MULTIVITAMIN ADULT) CHEW Chew 1 tablet by mouth daily.    [provider]  ondansetron (ZOFRAN ODT) 4 MG disintegrating tablet 25m ODT q4 hours prn nausea/vomit 05/24/18   FJacqlyn Larsen PA-C  ondansetron (ZOFRAN) 4 MG tablet Take 1 tablet (4 mg total) by mouth every 8 (eight) hours as needed for nausea or vomiting. 07/12/17   JLadell Pier MD  potassium chloride (K-DUR) 10 MEQ tablet Take 1 tablet (10 mEq total) by mouth daily. 12/03/17   JLadell Pier MD  TRUEPLUS LANCETS 28G MISC Use as directed 05/14/17   JTresa Garter MD    Family History Family History  Problem Relation Age of Onset  . Diabetes Father   . Hypertension Father   . Kidney disease Mother   .  Hypertension Mother   . Cancer Maternal Grandmother   . Hypertension Maternal Grandfather   . Diabetes Paternal Aunt   . Diabetes Paternal Uncle   . Diabetes Paternal Grandfather   . Colon cancer Neg Hx   . Breast cancer Neg Hx     Social History Social History   Tobacco Use  . Smoking status: Never Smoker  . Smokeless tobacco: Never Used  Substance Use Topics  . Alcohol use: No  . Drug use: No     Allergies   Other and Metformin and related   Review of Systems Review of Systems  Constitutional: Positive for appetite change and fatigue. Negative for chills and fever.  HENT: Negative for congestion, rhinorrhea and sore throat.   Eyes: Negative for visual disturbance.  Respiratory: Negative for cough and shortness of  breath.   Cardiovascular: Negative for chest pain.  Gastrointestinal: Positive for nausea and vomiting. Negative for abdominal pain, blood in stool, constipation and diarrhea.  Musculoskeletal: Positive for arthralgias and myalgias. Negative for back pain, joint swelling, neck pain and neck stiffness.  Skin: Negative for color change, rash and wound.  Neurological: Negative for syncope, weakness, light-headedness, numbness and headaches.     Physical Exam Updated Vital Signs BP (!) 190/76 (BP Location: Right Arm)   Pulse 61   Temp 99 F (37.2 C) (Oral)   Resp 17   Ht 6' (1.829 m)   Wt 117.5 kg (259 lb)   SpO2 99%   BMI 35.13 kg/m   Physical Exam  Constitutional: She is oriented to person, place, and time. She appears well-developed and well-nourished. No distress.  HENT:  Head: Normocephalic and atraumatic.  Mouth/Throat: Oropharynx is clear and moist.  Eyes: Right eye exhibits no discharge. Left eye exhibits no discharge.  Neck: Neck supple.  No rigidity  Cardiovascular: Normal rate, regular rhythm, normal heart sounds and intact distal pulses.  Pulmonary/Chest: Effort normal and breath sounds normal. No stridor. No respiratory distress. She  has no wheezes. She has no rales.  Respirations equal and unlabored, patient able to speak in full sentences, lungs clear to auscultation bilaterally  Abdominal: Soft. Bowel sounds are normal. She exhibits no distension and no mass. There is no tenderness. There is no guarding.  Abdomen soft, nondistended, nontender to palpation in all quadrants without guarding or peritoneal signs, no CVA tenderness  Musculoskeletal: She exhibits no edema or deformity.  T-spine and L-spine nontender to palpation at midline. Patient moves all extremities without difficulty. All joints supple and easily movable, no erythema, swelling or palpable deformity, all compartments soft.  Neurological: She is alert and oriented to person, place, and time. Coordination normal.  Skin: Skin is warm and dry. She is not diaphoretic.  No apparent rash or erythema over patient's back where tick was reported.  Psychiatric: She has a normal mood and affect. Her behavior is normal.  Nursing note and vitals reviewed.    ED Treatments / Results  Labs (all labs ordered are listed, but only abnormal results are displayed) Labs Reviewed  COMPREHENSIVE METABOLIC PANEL - Abnormal; Notable for the following components:      Result Value   Glucose, Bld 103 (*)    AST 14 (*)    GFR calc non Af Amer 60 (*)    All other components within normal limits  CBG MONITORING, ED - Abnormal; Notable for the following components:   Glucose-Capillary 103 (*)    All other components within normal limits  CBC WITH DIFFERENTIAL/PLATELET  LIPASE, BLOOD  B. BURGDORFI ANTIBODIES  ROCKY MTN SPOTTED FVR ABS PNL(IGG+IGM)  EHRLICHIA ANTIBODY PANEL    EKG None  Radiology No results found.  Procedures Procedures (including critical care time)  Medications Ordered in ED Medications  sodium chloride 0.9 % bolus 500 mL (0 mLs Intravenous Stopped 05/24/18 1426)  ondansetron (ZOFRAN) injection 4 mg (4 mg Intravenous Given 05/24/18 1241)      Initial Impression / Assessment and Plan / ED Course  I have reviewed the triage vital signs and the nursing notes.  Pertinent labs & imaging results that were available during my care of the patient were reviewed by me and considered in my medical decision making (see chart for details).  Patient presents for evaluation of nausea vomiting, poor appetite and myalgias, she reports tick bite that was completely removed  approximately 3 weeks ago, no rash noted.  Patient denies headaches, fevers, neck pain or stiffness.  Will get tickborne illness labs as well as abdominal labs and give IV fluid bolus and Zofran.  No leukocytosis, normal hemoglobin, no acute electrolyte derangements requiring intervention, patient is diabetic but CBG here is 103, no anion gap, no concern for DKA, normal renal and liver function, normal lipase.  Lyme disease, RMSF and early ketosis labs pending.  Will treat with doxycycline, she is tolerating p.o. here in the ED, Zofran as needed for nausea.  Patient follow-up with her primary care doctor.  Strict return precautions discussed.  Patient expresses understanding and is in agreement with plan.  Final Clinical Impressions(s) / ED Diagnoses   Final diagnoses:  Tick bite, initial encounter  Non-intractable vomiting with nausea, unspecified vomiting type  Myalgia    ED Discharge Orders        Ordered    doxycycline (VIBRAMYCIN) 100 MG capsule  2 times daily     05/24/18 1454    ondansetron (ZOFRAN ODT) 4 MG disintegrating tablet     05/24/18 1454       Jacqlyn Larsen, [place] 05/25/18 2345    Malvin Johns, MD 05/27/18 [id number]

## 2018-05-24 NOTE — ED Notes (Signed)
Patient given apple juice for PO challenge. ?

## 2018-05-25 LAB — B. BURGDORFI ANTIBODIES: B burgdorferi Ab IgG+IgM: <0.91 {ISR} (ref 0.00–0.90)

## 2018-05-27 MED FILL — ONDANSETRON ODT 4 MG TABLET: 4 | 2 days supply | Qty: 10 | Fill #0

## 2018-05-27 MED FILL — DOXYCYCLINE HYCLATE 100 MG: 100 | 10 days supply | Qty: 20 | Fill #0

## 2018-05-28 LAB — ROCKY MTN SPOTTED FVR ABS PNL(IGG+IGM)
RMSF IgG: NEGATIVE
RMSF IgM: 0.98 index — ABNORMAL HIGH (ref 0.00–0.89)

## 2018-05-28 LAB — EHRLICHIA ANTIBODY PANEL
E CHAFFEENSIS AB, IGM: NEGATIVE
E chaffeensis (HGE) Ab, IgG: NEGATIVE
E. CHAFFEENSIS (HME) IGM TITER: NEGATIVE
E.Chaffeensis (HME) IgG: NEGATIVE

## 2018-05-29 MED FILL — ?CLOPIDOGREL 75MG TAB: 75 | 90 days supply | Qty: 90 | Fill #1

## 2018-05-30 ENCOUNTER — Telehealth: Payer: Self-pay | Admitting: Internal Medicine

## 2018-05-30 NOTE — Telephone Encounter (Signed)
PC placed to pt this a.m to inform of lab results from ER.  No answer.  Left message on voicemail informing her that I was calling from [facility] clinic to go over lab results and to give [place] a call back.

## 2018-05-30 NOTE — Telephone Encounter (Signed)
Contacted pt to go over over Dr. Wynetta Emery concern pt didn't answer lef a detailed vm informing pt of this information and if she has any questions or concerns to give me a call

## 2018-06-03 MED FILL — !HUMULIN 70/30 KWIKPEN: (70-30) 100 | 30 days supply | Qty: 48 | Fill #1

## 2018-06-11 ENCOUNTER — Ambulatory Visit (INDEPENDENT_AMBULATORY_CARE_PROVIDER_SITE_OTHER): Payer: No Typology Code available for payment source | Admitting: Endocrinology

## 2018-06-11 ENCOUNTER — Encounter: Payer: Self-pay | Admitting: Endocrinology

## 2018-06-11 VITALS — BP 138/72 | HR 67 | Ht 72.0 in | Wt 228.2 lb

## 2018-06-11 DIAGNOSIS — E1142 Type 2 diabetes mellitus with diabetic polyneuropathy: Secondary | ICD-10-CM

## 2018-06-11 LAB — POCT GLYCOSYLATED HEMOGLOBIN (HGB A1C): Hemoglobin A1C: 10.8 % — AB (ref 4.0–5.6)

## 2018-06-11 MED ORDER — INSULIN ISOPHANE & REGULAR (HUMAN 70-30)100 UNIT/ML KWIKPEN: 160.0000 [IU] | PEN_INJECTOR | Freq: Every day | SUBCUTANEOUS | 11 refills | Status: DC

## 2018-06-11 NOTE — Progress Notes (Signed)
Subjective:    Patient ID: Katherine [name], female    DOB: August 01, 1956, 62 y.o.   MRN: [id number]  HPI Pt returns for f/u of diabetes mellitus:  DM type: 1 Dx'ed: 0160 Complications: polyneuropathy, gastroparesis, foot ulcer, renal insuff, PAD, and DR.  Therapy: insulin since 2014 GDM: never DKA: once (2016) Severe hypoglycemia: never.   Pancreatitis: never Pancreatic imaging: normal on 2016 CT.   Other: she gets insulin through [facility]; she declines multiple daily injections; she did not tolerate levemir (rash); she was changed to NPH, then to 70/30, due to the pattern of her cbg's.   Interval history: Pt says she does not know what insulin she takes, but she takes a widely varying dosage, BID.  pt states she feels well in general.   no cbg record, but states cbg's vary widely.  She seldom has hypoglycemia, and these episodes are mild.   Past Medical History:  Diagnosis Date  . Anemia    requiring transfusion  . Boil of buttock ~ 2016  . Depression   . Diabetic neuropathy ([place])    Archie Endo 08/23/2017  . DKA (diabetic ketoacidoses) ([place])    recent/notes 01/06/2015  . Hyperlipidemia   . Hypertension   . Neuropathy   . Peripheral vascular disease ([place])   . Toe ulcer ([place])    left great toe/notes 08/23/2017  . Type II diabetes mellitus ([place])     Past Surgical History:  Procedure Laterality Date  . AMPUTATION TOE Left 10/26/2017   Procedure: AMPUTATION PARTIAL RAY LEFT FOOT AND IRRIGATION/DEBRIDEMENT;  Surgeon: Edrick Kins, DPM;  Location: [facility];  Service: Podiatry;  Laterality: Left;  AMPUTATION PARTIAL RAY LEFT FOOT AND IRRIGATION/DEBRIDEMENT  . CHOLECYSTECTOMY  01/24/2003   Archie Endo 04/04/2011  . COLONOSCOPY    . INCISION AND DRAINAGE ABSCESS  03/17/2012   "right but; left lower abdoment"/notes 03/17/2012  . LOWER EXTREMITY INTERVENTION N/A 09/10/2017   Procedure: LOWER EXTREMITY INTERVENTION;  Surgeon: Lorretta Harp, MD;  Location: [facility];  Service: Cardiovascular;   Laterality: N/A;  . PERIPHERAL VASCULAR ATHERECTOMY  09/10/2017   Procedure: PERIPHERAL VASCULAR ATHERECTOMY;  Surgeon: Lorretta Harp, MD;  Location: [facility];  Service: Cardiovascular;;  left AT    Social History   Socioeconomic History  . Marital status: Married    Spouse name: Not on file  . Number of children: Not on file  . Years of education: Not on file  . Highest education level: Not on file  Occupational History  . Not on file  Social Needs  . Financial resource strain: Not on file  . Food insecurity:    Worry: Not on file    Inability: Not on file  . Transportation needs:    Medical: Not on file    Non-medical: Not on file  Tobacco Use  . Smoking status: Never Smoker  . Smokeless tobacco: Never Used  Substance and Sexual Activity  . Alcohol use: No  . Drug use: No  . Sexual activity: Never  Lifestyle  . Physical activity:    Days per week: Not on file    Minutes per session: Not on file  . Stress: Not on file  Relationships  . Social connections:    Talks on phone: Not on file    Gets together: Not on file    Attends religious service: Not on file    Active member of club or organization: Not on file    Attends meetings of  clubs or organizations: Not on file    Relationship status: Not on file  . Intimate partner violence:    Fear of current or ex partner: Not on file    Emotionally abused: Not on file    Physically abused: Not on file    Forced sexual activity: Not on file  Other Topics Concern  . Not on file  Social History Narrative  . Not on file    Current Outpatient Medications on File Prior to Visit  Medication Sig Dispense Refill  . atorvastatin (LIPITOR) 80 MG tablet Take 1 tablet (80 mg total) by mouth daily at 6 PM. 30 tablet 6  . Blood Glucose Monitoring Suppl (TRUE METRIX METER) w/Device KIT Use as directed 1 kit 0  . carvedilol (COREG) 3.125 MG tablet Take 1 tablet (3.125 mg total) by mouth 2 (two) times daily with a  meal. 60 tablet 3  . clopidogrel (PLAVIX) 75 MG tablet Take 1 tablet (75 mg total) by mouth daily with breakfast. 90 tablet 1  . docusate sodium (COLACE) 100 MG capsule Take 1 capsule (100 mg total) by mouth daily. (Patient taking differently: Take 100 mg by mouth daily as needed for mild constipation. ) 30 capsule 4  . gabapentin (NEURONTIN) 300 MG capsule 2 caps PO Q a.m and 2 tabs PO Q afternoon and 3 tabs Q p.m 210 capsule 5  . glucose blood (TRUE METRIX BLOOD GLUCOSE TEST) test strip Use as instructed 100 each 12  . hydrALAZINE (APRESOLINE) 100 MG tablet Take 1 tablet (100 mg total) by mouth 3 (three) times daily. 90 tablet 2  . hydrochlorothiazide (HYDRODIURIL) 25 MG tablet Take 1 tablet (25 mg total) by mouth daily. 30 tablet 6  . loratadine (CLARITIN) 10 MG tablet Take 1 tablet (10 mg total) by mouth daily. 30 tablet 11  . losartan (COZAAR) 100 MG tablet Take 1 tablet (100 mg total) by mouth daily. 30 tablet 6  . Multiple Vitamins-Minerals (MULTIVITAMIN ADULT) CHEW Chew 1 tablet by mouth daily.    . ondansetron (ZOFRAN ODT) 4 MG disintegrating tablet '4mg'$  ODT q4 hours prn nausea/vomit 10 tablet 0  . ondansetron (ZOFRAN) 4 MG tablet Take 1 tablet (4 mg total) by mouth every 8 (eight) hours as needed for nausea or vomiting. 20 tablet 0  . potassium chloride (K-DUR) 10 MEQ tablet Take 1 tablet (10 mEq total) by mouth daily. 30 tablet 6  . TRUEPLUS LANCETS 28G MISC Use as directed 100 each 12   No current facility-administered medications on file prior to visit.     Allergies  Allergen Reactions  . Other Swelling    Seaweed= swelling on arms, hands and face  . Metformin And Related Nausea Only    Family History  Problem Relation Age of Onset  . Diabetes Father   . Hypertension Father   . Kidney disease Mother   . Hypertension Mother   . Cancer Maternal Grandmother   . Hypertension Maternal Grandfather   . Diabetes Paternal Aunt   . Diabetes Paternal Uncle   . Diabetes Paternal  Grandfather   . Colon cancer Neg Hx   . Breast cancer Neg Hx     BP 138/72 (BP Location: Right Arm, Patient Position: Sitting, Cuff Size: Normal)   Pulse 67   Ht 6' (1.829 m)   Wt 228 lb 3.2 oz (103.5 kg)   SpO2 93%   BMI 30.95 kg/m    Review of Systems Denies LOC.  No weight change.  Objective:   Physical Exam VITAL SIGNS:  See vs page GENERAL: no distress Pulses: dorsalis pedis absent bilat (poss due to edema).  MSK: no deformity of the feet, except left great toe is absent CV: 1+ bilat leg edema Skin:  no ulcer on the feet.  normal color and temp on the feet.  Neuro: sensation is intact to touch on the feet, but decreased from normal.     Lab Results  Component Value Date   HGBA1C 10.8 (A) 06/11/2018   Lab Results  Component Value Date   CREATININE 0.99 05/24/2018   BUN 16 05/24/2018   NA 141 05/24/2018   K 3.8 05/24/2018   CL 102 05/24/2018   CO2 31 05/24/2018      Assessment & Plan:  type 1 DM, with PAD: worse.  Noncompliance with cbg recording and insulin: we discussed need to take insulin as rx'ed.      Patient Instructions  check your blood sugar twice a day.  vary the time of day when you check, between before the 3 meals, and at bedtime.  also check if you have symptoms of your blood sugar being too high or too low.  please keep a record of the readings and bring it to your next appointment here (or you can bring the meter itself).  You can write it on any piece of paper.  please call us sooner if your blood sugar goes below 70, or if you have a lot of readings over 200.   I have resent a prescription to your pharmacy, to change to "70/30," 160 units with breakfast.  Please make sure this is what you take.   On this type of insulin schedule, you should eat meals on a regular schedule.  If a meal is missed or significantly delayed, your blood sugar could go low.   Please come back for a follow-up appointment in 2 months.

## 2018-06-11 NOTE — Patient Instructions (Addendum)
check your blood sugar twice a day.  vary the time of day when you check, between before the 3 meals, and at bedtime.  also check if you have symptoms of your blood sugar being too high or too low.  please keep a record of the readings and bring it to your next appointment here (or you can bring the meter itself).  You can write it on any piece of paper.  please call us sooner if your blood sugar goes below 70, or if you have a lot of readings over 200.   I have resent a prescription to your pharmacy, to change to "70/30," 160 units with breakfast.  Please make sure this is what you take.   On this type of insulin schedule, you should eat meals on a regular schedule.  If a meal is missed or significantly delayed, your blood sugar could go low.   Please come back for a follow-up appointment in 2 months.

## 2018-06-24 ENCOUNTER — Ambulatory Visit: Payer: Self-pay | Attending: Family Medicine | Admitting: Podiatry

## 2018-06-24 DIAGNOSIS — B351 Tinea unguium: Secondary | ICD-10-CM

## 2018-06-24 DIAGNOSIS — E1142 Type 2 diabetes mellitus with diabetic polyneuropathy: Secondary | ICD-10-CM

## 2018-06-24 DIAGNOSIS — M79674 Pain in right toe(s): Secondary | ICD-10-CM

## 2018-06-24 DIAGNOSIS — M79675 Pain in left toe(s): Secondary | ICD-10-CM

## 2018-06-25 ENCOUNTER — Ambulatory Visit: Payer: No Typology Code available for payment source | Admitting: Internal Medicine

## 2018-06-26 NOTE — Progress Notes (Signed)
Subjective: 62 year [facility] for concerns of thick, elongated toenails that she cannot trim herself.  She recently had an amputation of the left big toe in December 2018 which is healed well but she does not want to look at her toe.  She denies any open sores that she is recalled and she denies any swelling.  Her husband does look at her feet.  She has recently started a new medication which makes her dizzy at times. Denies any systemic complaints such as fevers, chills, nausea, vomiting. No acute changes since last appointment, and no other complaints at this time.   Objective: AAO x3, NAD DP/PT pulses palpable however decreased Nails are hypertrophic, dystrophic, brittle, discolored, elongated 9. No surrounding redness or drainage. Tenderness nails 1-5 bilaterally except for the left hallux which is been amputated and this is well-healed.. No open lesions or pre-ulcerative lesions are identified today. No open lesions or pre-ulcerative lesions.  No pain with calf compression, swelling, warmth, erythema  Assessment: Symptomatic onychomycosis  Plan: -All treatment options discussed with the patient including all alternatives, risks, complications.  -The nails were sharply debrided x9 without any complications or bleeding. -Today the office she states that her blood sugar was low.  Recheck it was 74.  I did give her a small piece of candy.  After she ate that she felt much better. -She has burning pain to her feet she is on gabapentin.  I do not want to increase the dose the gabapentin given she is already having some issues with her other medications.  She is seen her primary care physician tomorrow. -Patient encouraged to call the office with any questions, concerns, change in symptoms.   Trula Slade DPM

## 2018-07-11 ENCOUNTER — Other Ambulatory Visit: Payer: Self-pay | Admitting: Internal Medicine

## 2018-07-11 DIAGNOSIS — I10 Essential (primary) hypertension: Secondary | ICD-10-CM

## 2018-07-19 ENCOUNTER — Encounter: Payer: Self-pay | Admitting: Cardiovascular Disease

## 2018-07-19 ENCOUNTER — Ambulatory Visit (INDEPENDENT_AMBULATORY_CARE_PROVIDER_SITE_OTHER): Payer: Self-pay | Admitting: Cardiovascular Disease

## 2018-07-19 VITALS — BP 130/90 | HR 66 | Ht 72.0 in | Wt 228.0 lb

## 2018-07-19 DIAGNOSIS — E785 Hyperlipidemia, unspecified: Secondary | ICD-10-CM

## 2018-07-19 DIAGNOSIS — I10 Essential (primary) hypertension: Secondary | ICD-10-CM

## 2018-07-19 LAB — HEPATIC FUNCTION PANEL
ALBUMIN: 4.2 g/dL (ref 3.6–4.8)
ALT: 10 IU/L (ref 0–32)
AST: 8 IU/L (ref 0–40)
Alkaline Phosphatase: 147 IU/L — ABNORMAL HIGH (ref 39–117)
Bilirubin Total: 0.6 mg/dL (ref 0.0–1.2)
Bilirubin, Direct: 0.18 mg/dL (ref 0.00–0.40)
Total Protein: 7.2 g/dL (ref 6.0–8.5)

## 2018-07-19 LAB — LIPID PANEL
CHOL/HDL RATIO: 2.5 ratio (ref 0.0–4.4)
Cholesterol, Total: 130 mg/dL (ref 100–199)
HDL: 52 mg/dL (ref >39)
LDL Calculated: 55 mg/dL (ref 0–99)
Triglycerides: 116 mg/dL (ref 0–149)
VLDL Cholesterol Cal: 23 mg/dL (ref 5–40)

## 2018-07-19 NOTE — Progress Notes (Signed)
07/19/2018 Katherine [name]   1956/06/12  [id number]  Primary Physician Ladell Pier, MD Primary Cardiologist: Lorretta Harp MD Lupe Carney, [place]  HPI:  Katherine [name] is a 62 y.o.  married African-American female with no children who worked as a Scientist, water quality. She was referred to me by Dr. Amalia Hailey for peripheral vascular evaluation because of critical limb ischemia.I last saw her in the 02/05/2018.She does have a history of treated hypertension, diabetes and hyperlipidemia. She has never had a heart attack or stroke. She does have a gangrenous left great toe is an ulcer there for several months having undergone several rounds of antibiotic therapy. Her left toe is painful. She had recent venous Doppler study that did not show evidence of DVT.I perform peripheral angiography of her 09/10/17 revealing an occluded left anterior tibial posterior tibial artery with a patent peroneal. I was able to recanalize the anterior tibial down to the foot. Her hospital course was mildly prolonged because of unexplained anemia requiring transfusion. She did not have a retroperitoneal hematoma by CT. Her subsequent lower extremity arterial Doppler studies performed on 09/24/17 showed an increase in her left ABI from 0.67 up to 1.03.When I saw her last approximately 5 weeks ago she still had some edema with an unchanged ischemic ulcer. She also underwent left first toe partial ray amputation by Dr. Amalia Hailey on 10/26/17 .  Since I saw her 6 months ago she continues to do well. She does have some nocturnal pain in that foot and some swelling. Her Dopplers again improved back to baseline and her ray resection has completely healed.  She denies chest pain or shortness of breath.  Her most recent Dopplers performed 03/29/2018 revealed normal ABIs bilaterally with a patent left anterior tibial artery.   Current Meds  Medication Sig  . atorvastatin (LIPITOR) 80 MG tablet Take 1 tablet (80 mg total) by mouth daily at 6  PM.  . Blood Glucose Monitoring Suppl (TRUE METRIX METER) w/Device KIT Use as directed  . carvedilol (COREG) 3.125 MG tablet Take 1 tablet (3.125 mg total) by mouth 2 (two) times daily with a meal.  . clopidogrel (PLAVIX) 75 MG tablet Take 1 tablet (75 mg total) by mouth daily with breakfast.  . docusate sodium (COLACE) 100 MG capsule Take 1 capsule (100 mg total) by mouth daily. (Patient taking differently: Take 100 mg by mouth daily as needed for mild constipation. )  . gabapentin (NEURONTIN) 300 MG capsule 2 caps PO Q a.m and 2 tabs PO Q afternoon and 3 tabs Q p.m  . glucose blood (TRUE METRIX BLOOD GLUCOSE TEST) test strip Use as instructed  . hydrALAZINE (APRESOLINE) 100 MG tablet Take 1 tablet (100 mg total) by mouth 3 (three) times daily.  . hydrochlorothiazide (HYDRODIURIL) 25 MG tablet TAKE 1 TABLET (25 MG TOTAL) BY MOUTH DAILY.  Marland Kitchen Insulin Isophane & Regular Human (HUMULIN 70/30 KWIKPEN) (70-30) 100 UNIT/ML PEN Inject 160 Units into the skin daily with breakfast. And pen needles 2/day  . loratadine (CLARITIN) 10 MG tablet Take 1 tablet (10 mg total) by mouth daily.  . Multiple Vitamins-Minerals (MULTIVITAMIN ADULT) CHEW Chew 1 tablet by mouth daily.  . ondansetron (ZOFRAN ODT) 4 MG disintegrating tablet 26m ODT q4 hours prn nausea/vomit  . potassium chloride (K-DUR) 10 MEQ tablet Take 1 tablet (10 mEq total) by mouth daily.  . TRUEPLUS LANCETS 28G MISC Use as directed  . TRUEPLUS PEN NEEDLES 31G X 5 MM MISC See admin  instructions.     Allergies  Allergen Reactions  . Other Swelling    Seaweed= swelling on arms, hands and face  . Metformin And Related Nausea Only    Social History   Socioeconomic History  . Marital status: Married    Spouse name: Not on file  . Number of children: Not on file  . Years of education: Not on file  . Highest education level: Not on file  Occupational History  . Not on file  Social Needs  . Financial resource strain: Not on file  . Food  insecurity:    Worry: Not on file    Inability: Not on file  . Transportation needs:    Medical: Not on file    Non-medical: Not on file  Tobacco Use  . Smoking status: Never Smoker  . Smokeless tobacco: Never Used  Substance and Sexual Activity  . Alcohol use: No  . Drug use: No  . Sexual activity: Never  Lifestyle  . Physical activity:    Days per week: Not on file    Minutes per session: Not on file  . Stress: Not on file  Relationships  . Social connections:    Talks on phone: Not on file    Gets together: Not on file    Attends religious service: Not on file    Active member of club or organization: Not on file    Attends meetings of clubs or organizations: Not on file    Relationship status: Not on file  . Intimate partner violence:    Fear of current or ex partner: Not on file    Emotionally abused: Not on file    Physically abused: Not on file    Forced sexual activity: Not on file  Other Topics Concern  . Not on file  Social History Narrative  . Not on file     Review of Systems: General: negative for chills, fever, night sweats or weight changes.  Cardiovascular: negative for chest pain, dyspnea on exertion, edema, orthopnea, palpitations, paroxysmal nocturnal dyspnea or shortness of breath Dermatological: negative for rash Respiratory: negative for cough or wheezing Urologic: negative for hematuria Abdominal: negative for nausea, vomiting, diarrhea, bright red blood per rectum, melena, or hematemesis Neurologic: negative for visual changes, syncope, or dizziness All other systems reviewed and are otherwise negative except as noted above.    Blood pressure 130/90, pulse 66, height 6' (1.829 m), weight 228 lb (103.4 kg).  General appearance: alert and no distress Neck: no adenopathy, no carotid bruit, no JVD, supple, symmetrical, trachea midline and thyroid not enlarged, symmetric, no tenderness/mass/nodules Lungs: clear to auscultation bilaterally Heart:  regular rate and rhythm, S1, S2 normal, no murmur, click, rub or gallop Extremities: extremities normal, atraumatic, no cyanosis or edema and 2+ left dorsalis pedis Pulses: 2+ left dorsalis pedis Skin: Skin color, texture, turgor normal. No rashes or lesions Neurologic: Alert and oriented X 3, normal strength and tone. Normal symmetric reflexes. Normal coordination and gait  EKG sinus rhythm at 66 borderline LVH nonspecific ST and T wave changes.  Personally reviewed this EKG.  ASSESSMENT AND PLAN:   Essential hypertension She of essential hypertension her blood pressure measured at 130/90 she is on carvedilol, hydralazine and hydrochlorothiazide.  Continue current meds at current dosing.  Dyslipidemia History of dyslipidemia on statin therapy.  We will recheck a lipid and liver profile.  Critical lower limb ischemia History of critical limb ischemia with a nonhealing ulcer on her left great toe  status post left anterior tibial intervention by myself 09/10/2017.  She subsequently underwent a left first toe ray resection by Dr. Amalia Hailey which healed nicely when I saw her back in March of this year.  She does complain of pain in both feet but I suspect this is related to diabetic peripheral neuropathy.  On exam she is got a 2+ palpable dorsalis pedis, well-healed left great toe amputation site and a warm foot.  Her Dopplers likewise showed normal ABIs with a patent anterior tibial artery on the left.      Lorretta Harp MD FACP,FACC,FAHA, [facility] 07/19/2018 9:55 AM

## 2018-07-19 NOTE — Patient Instructions (Signed)
Medication Instructions:  Your physician recommends that you continue on your current medications as directed. Please refer to the Current Medication list given to you today.   Labwork: Your physician recommends that you return for lab work in: TODAY   Testing/Procedures: Your physician has requested that you have a lower extremity arterial doppler- During this test, ultrasound is used to evaluate arterial blood flow in the legs. Allow approximately one hour for this exam.  Your physician has requested that you have an ankle brachial index (ABI). During this test an ultrasound and blood pressure cuff are used to evaluate the arteries that supply the arms and legs with blood. Allow thirty minutes for this exam. There are no restrictions or special instructions.   Follow-Up: Your physician wants you to follow-up in: 12 months with Dr. Gwenlyn Found. You will receive a reminder letter in the mail two months in advance. If you don't receive a letter, please call our office to schedule the follow-up appointment.   Any Other Special Instructions Will Be Listed Below (If Applicable).     If you need a refill on your cardiac medications before your next appointment, please call your pharmacy.

## 2018-07-19 NOTE — Assessment & Plan Note (Signed)
History of dyslipidemia on statin therapy.  We will recheck a lipid and liver profile. 

## 2018-07-19 NOTE — Assessment & Plan Note (Signed)
History of critical limb ischemia with a nonhealing ulcer on her left great toe status post left anterior tibial intervention by myself 09/10/2017.  She subsequently underwent a left first toe ray resection by Dr. Amalia Hailey which healed nicely when I saw her back in March of this year.  She does complain of pain in both feet but I suspect this is related to diabetic peripheral neuropathy.  On exam she is got a 2+ palpable dorsalis pedis, well-healed left great toe amputation site and a warm foot.  Her Dopplers likewise showed normal ABIs with a patent anterior tibial artery on the left.

## 2018-07-19 NOTE — Assessment & Plan Note (Signed)
She of essential hypertension her blood pressure measured at 130/90 she is on carvedilol, hydralazine and hydrochlorothiazide.  Continue current meds at current dosing.

## 2018-07-23 ENCOUNTER — Encounter: Payer: Self-pay | Admitting: *Deleted

## 2018-07-24 MED FILL — GABAPENTIN 300 MG CAPSULE: 300 | 30 days supply | Qty: 210 | Fill #0

## 2018-07-24 MED FILL — HYDROCHLOROTHIAZIDE 25 MG T: 25 | 30 days supply | Qty: 30 | Fill #0

## 2018-08-12 ENCOUNTER — Ambulatory Visit (INDEPENDENT_AMBULATORY_CARE_PROVIDER_SITE_OTHER): Payer: Self-pay | Admitting: Endocrinology

## 2018-08-12 ENCOUNTER — Encounter: Payer: Self-pay | Admitting: Endocrinology

## 2018-08-12 VITALS — BP 132/88 | HR 73 | Ht 72.0 in | Wt 231.7 lb

## 2018-08-12 DIAGNOSIS — E1142 Type 2 diabetes mellitus with diabetic polyneuropathy: Secondary | ICD-10-CM

## 2018-08-12 LAB — POCT GLYCOSYLATED HEMOGLOBIN (HGB A1C): HEMOGLOBIN A1C: 10.3 % — AB (ref 4.0–5.6)

## 2018-08-12 MED ORDER — INSULIN GLARGINE 100 UNIT/ML SOLOSTAR PEN: 160.0000 [IU] | PEN_INJECTOR | SUBCUTANEOUS | 2 refills | Status: DC

## 2018-08-12 MED FILL — !LANTUS SOLOSTAR 100UNITS/M: 100 | 15 days supply | Qty: 24 | Fill #0

## 2018-08-12 NOTE — Progress Notes (Signed)
Subjective:    Patient ID: Katherine [name], female    DOB: 06-14-1956, 62 y.o.   MRN: [id number]  HPI Pt returns for f/u of diabetes mellitus:  DM type: 1 Dx'ed: 0354 Complications: polyneuropathy, gastroparesis, foot ulcer, renal insuff, PAD, and DR.  Therapy: insulin since 2014 GDM: never DKA: once (2016) Severe hypoglycemia: never.   Pancreatitis: never.   Pancreatic imaging: normal on 2016 CT.   Other: she gets insulin through [facility]; she declines multiple daily injections; she did not tolerate levemir (rash); she was changed to NPH, then to 70/30, due to the pattern of her cbg's.   Interval history: no cbg record, but states cbg's vary from 74-383.  She has hypoglycemia approx QOD, and these episodes are mild.  This happens in the afternoon.  cbg is highest at HS.   Past Medical History:  Diagnosis Date  . Anemia    requiring transfusion  . Boil of buttock ~ 2016  . Depression   . Diabetic neuropathy ([place])    Archie Endo 08/23/2017  . DKA (diabetic ketoacidoses) ([place])    recent/notes 01/06/2015  . Hyperlipidemia   . Hypertension   . Neuropathy   . Peripheral vascular disease ([place])   . Toe ulcer ([place])    left great toe/notes 08/23/2017  . Type II diabetes mellitus ([place])     Past Surgical History:  Procedure Laterality Date  . AMPUTATION TOE Left 10/26/2017   Procedure: AMPUTATION PARTIAL RAY LEFT FOOT AND IRRIGATION/DEBRIDEMENT;  Surgeon: Edrick Kins, DPM;  Location: [facility];  Service: Podiatry;  Laterality: Left;  AMPUTATION PARTIAL RAY LEFT FOOT AND IRRIGATION/DEBRIDEMENT  . CHOLECYSTECTOMY  01/24/2003   Archie Endo 04/04/2011  . COLONOSCOPY    . INCISION AND DRAINAGE ABSCESS  03/17/2012   "right but; left lower abdoment"/notes 03/17/2012  . LOWER EXTREMITY INTERVENTION N/A 09/10/2017   Procedure: LOWER EXTREMITY INTERVENTION;  Surgeon: Lorretta Harp, MD;  Location: [facility];  Service: Cardiovascular;  Laterality: N/A;  . PERIPHERAL VASCULAR ATHERECTOMY  09/10/2017     Procedure: PERIPHERAL VASCULAR ATHERECTOMY;  Surgeon: Lorretta Harp, MD;  Location: [facility];  Service: Cardiovascular;;  left AT    Social History   Socioeconomic History  . Marital status: Married    Spouse name: Not on file  . Number of children: Not on file  . Years of education: Not on file  . Highest education level: Not on file  Occupational History  . Not on file  Social Needs  . Financial resource strain: Not on file  . Food insecurity:    Worry: Not on file    Inability: Not on file  . Transportation needs:    Medical: Not on file    Non-medical: Not on file  Tobacco Use  . Smoking status: Never Smoker  . Smokeless tobacco: Never Used  Substance and Sexual Activity  . Alcohol use: No  . Drug use: No  . Sexual activity: Never  Lifestyle  . Physical activity:    Days per week: Not on file    Minutes per session: Not on file  . Stress: Not on file  Relationships  . Social connections:    Talks on phone: Not on file    Gets together: Not on file    Attends religious service: Not on file    Active member of club or organization: Not on file    Attends meetings of clubs or organizations: Not on file    Relationship status:  Not on file  . Intimate partner violence:    Fear of current or ex partner: Not on file    Emotionally abused: Not on file    Physically abused: Not on file    Forced sexual activity: Not on file  Other Topics Concern  . Not on file  Social History Narrative  . Not on file    Current Outpatient Medications on File Prior to Visit  Medication Sig Dispense Refill  . atorvastatin (LIPITOR) 80 MG tablet Take 1 tablet (80 mg total) by mouth daily at 6 PM. 30 tablet 6  . Blood Glucose Monitoring Suppl (TRUE METRIX METER) w/Device KIT Use as directed 1 kit 0  . carvedilol (COREG) 3.125 MG tablet Take 1 tablet (3.125 mg total) by mouth 2 (two) times daily with a meal. 60 tablet 3  . clopidogrel (PLAVIX) 75 MG tablet Take 1 tablet  (75 mg total) by mouth daily with breakfast. 90 tablet 1  . docusate sodium (COLACE) 100 MG capsule Take 1 capsule (100 mg total) by mouth daily. (Patient taking differently: Take 100 mg by mouth daily as needed for mild constipation. ) 30 capsule 4  . gabapentin (NEURONTIN) 300 MG capsule 2 caps PO Q a.m and 2 tabs PO Q afternoon and 3 tabs Q p.m 210 capsule 5  . glucose blood (TRUE METRIX BLOOD GLUCOSE TEST) test strip Use as instructed 100 each 12  . hydrALAZINE (APRESOLINE) 100 MG tablet Take 1 tablet (100 mg total) by mouth 3 (three) times daily. 90 tablet 2  . hydrochlorothiazide (HYDRODIURIL) 25 MG tablet TAKE 1 TABLET (25 MG TOTAL) BY MOUTH DAILY. 30 tablet 2  . loratadine (CLARITIN) 10 MG tablet Take 1 tablet (10 mg total) by mouth daily. 30 tablet 11  . Multiple Vitamins-Minerals (MULTIVITAMIN ADULT) CHEW Chew 1 tablet by mouth daily.    . ondansetron (ZOFRAN ODT) 4 MG disintegrating tablet 71m ODT q4 hours prn nausea/vomit 10 tablet 0  . potassium chloride (K-DUR) 10 MEQ tablet Take 1 tablet (10 mEq total) by mouth daily. 30 tablet 6  . TRUEPLUS LANCETS 28G MISC Use as directed 100 each 12  . TRUEPLUS PEN NEEDLES 31G X 5 MM MISC See admin instructions.  11   No current facility-administered medications on file prior to visit.     Allergies  Allergen Reactions  . Other Swelling    Seaweed= swelling on arms, hands and face  . Metformin And Related Nausea Only    Family History  Problem Relation Age of Onset  . Diabetes Father   . Hypertension Father   . Kidney disease Mother   . Hypertension Mother   . Cancer Maternal Grandmother   . Hypertension Maternal Grandfather   . Diabetes Paternal Aunt   . Diabetes Paternal Uncle   . Diabetes Paternal Grandfather   . Colon cancer Neg Hx   . Breast cancer Neg Hx     BP 132/88   Pulse 73   Ht 6' (1.829 m)   Wt 231 lb 11.2 oz (105.1 kg)   SpO2 96%   BMI 31.42 kg/m    Review of Systems Denies LOC.      Objective:    Physical Exam VITAL SIGNS:  See vs page GENERAL: no distress Pulses: dorsalis pedis absent bilat (poss due to edema).  MSK: no deformity of the feet, except left great toe is absent.   CV: 1+ bilat leg edema Skin:  no ulcer on the feet.  normal color and  temp on the feet.  Neuro: sensation is intact to touch on the feet, but decreased from normal.      Lab Results  Component Value Date   HGBA1C 10.3 (A) 08/12/2018       Assessment & Plan:  Type 1 DM, with renal failure: worse.   Hypoglycemia: although she would need a faster-acting QD insulin, the simplicity of lantus is preferred.    Patient Instructions  Please change the insulin to lantus, 160 units qam. check your blood sugar twice a day.  vary the time of day when you check, between before the 3 meals, and at bedtime.  also check if you have symptoms of your blood sugar being too high or too low.  please keep a record of the readings and bring it to your next appointment here (or you can bring the meter itself).  You can write it on any piece of paper.  please call us sooner if your blood sugar goes below 70, or if you have a lot of readings over 200.   Please come back for a follow-up appointment in 2 months.

## 2018-08-12 NOTE — Patient Instructions (Signed)
Please change the insulin to lantus, 160 units qam. check your blood sugar twice a day.  vary the time of day when you check, between before the 3 meals, and at bedtime.  also check if you have symptoms of your blood sugar being too high or too low.  please keep a record of the readings and bring it to your next appointment here (or you can bring the meter itself).  You can write it on any piece of paper.  please call us sooner if your blood sugar goes below 70, or if you have a lot of readings over 200.   Please come back for a follow-up appointment in 2 months.

## 2018-08-23 ENCOUNTER — Ambulatory Visit: Payer: Self-pay | Admitting: Internal Medicine

## 2018-08-28 ENCOUNTER — Ambulatory Visit: Payer: Self-pay | Attending: Internal Medicine | Admitting: Physician Assistant

## 2018-08-28 ENCOUNTER — Other Ambulatory Visit: Payer: Self-pay

## 2018-08-28 VITALS — BP 156/76 | HR 65 | Temp 98.1°F | Resp 18 | Ht 72.0 in | Wt 235.2 lb

## 2018-08-28 DIAGNOSIS — F329 Major depressive disorder, single episode, unspecified: Secondary | ICD-10-CM | POA: Insufficient documentation

## 2018-08-28 DIAGNOSIS — I10 Essential (primary) hypertension: Secondary | ICD-10-CM | POA: Insufficient documentation

## 2018-08-28 DIAGNOSIS — E1140 Type 2 diabetes mellitus with diabetic neuropathy, unspecified: Secondary | ICD-10-CM | POA: Insufficient documentation

## 2018-08-28 DIAGNOSIS — E785 Hyperlipidemia, unspecified: Secondary | ICD-10-CM | POA: Insufficient documentation

## 2018-08-28 DIAGNOSIS — Z794 Long term (current) use of insulin: Secondary | ICD-10-CM | POA: Insufficient documentation

## 2018-08-28 DIAGNOSIS — E1151 Type 2 diabetes mellitus with diabetic peripheral angiopathy without gangrene: Secondary | ICD-10-CM | POA: Insufficient documentation

## 2018-08-28 DIAGNOSIS — Z79899 Other long term (current) drug therapy: Secondary | ICD-10-CM | POA: Insufficient documentation

## 2018-08-28 DIAGNOSIS — Z23 Encounter for immunization: Secondary | ICD-10-CM

## 2018-08-28 DIAGNOSIS — Z7902 Long term (current) use of antithrombotics/antiplatelets: Secondary | ICD-10-CM | POA: Insufficient documentation

## 2018-08-28 DIAGNOSIS — R5383 Other fatigue: Secondary | ICD-10-CM | POA: Insufficient documentation

## 2018-08-28 DIAGNOSIS — E118 Type 2 diabetes mellitus with unspecified complications: Secondary | ICD-10-CM

## 2018-08-28 DIAGNOSIS — E1165 Type 2 diabetes mellitus with hyperglycemia: Secondary | ICD-10-CM | POA: Insufficient documentation

## 2018-08-28 DIAGNOSIS — IMO0002 Reserved for concepts with insufficient information to code with codable children: Secondary | ICD-10-CM

## 2018-08-28 DIAGNOSIS — R0981 Nasal congestion: Secondary | ICD-10-CM | POA: Insufficient documentation

## 2018-08-28 LAB — GLUCOSE, POCT (MANUAL RESULT ENTRY): POC GLUCOSE: 209 mg/dL — AB (ref 70–99)

## 2018-08-28 MED ORDER — FLUTICASONE PROPIONATE 50 MCG/ACT NA SUSP: 2.0000 | Freq: Every day | NASAL | 6 refills | Status: DC

## 2018-08-28 MED ORDER — CARVEDILOL 6.25 MG PO TABS: 6.2500 mg | ORAL_TABLET | Freq: Two times a day (BID) | ORAL | 3 refills | Status: DC

## 2018-08-28 MED FILL — CARVEDILOL 6.25 MG TABLET: 6.25 | 30 days supply | Qty: 60 | Fill #0

## 2018-08-28 NOTE — Patient Instructions (Signed)
Check blood pressure 2-3 times/week and record and bring to next visit

## 2018-08-28 NOTE — Progress Notes (Signed)
Patient ID: Katherine [name], female   DOB: June 30, 1956, 62 y.o.   MRN: [id number]   Katherine [name], is a 62 y.o. female  UXN:[id number]  URK:[id number]  DOB - Apr 16, 1956  Subjective:  Chief Complaint and HPI: Katherine [name] is a 62 y.o. female here today with fatigue for several months.  No change in appetite.  No melena/hematochezia.  Takes a multivitamin which hasn't helped.  Sleep is fair.    Works with endocrinologist on blood sugar.    Blood pressure outside of the office runs 150-170/80-90.   Also, having nasal congestion since last night.  No f/c.  No cough.     ROS:   Constitutional:  No f/c, No night sweats, No unexplained weight loss. EENT:  No vision changes, No blurry vision, No hearing changes. No additional mouth, throat, or ear problems.  Respiratory: No cough, No SOB Cardiac: No CP, no palpitations GI:  No abd pain, No N/V/D. GU: No Urinary s/sx Musculoskeletal: +aches and pains Neuro: No headache, no dizziness, no motor weakness.  Skin: No rash Endocrine:  No polydipsia. No polyuria.  Psych: Denies SI/HI  No problems updated.  ALLERGIES: Allergies  Allergen Reactions  . Other Swelling    Seaweed= swelling on arms, hands and face  . Metformin And Related Nausea Only    PAST MEDICAL HISTORY: Past Medical History:  Diagnosis Date  . Anemia    requiring transfusion  . Boil of buttock ~ 2016  . Depression   . Diabetic neuropathy ([place])    Archie Endo 08/23/2017  . DKA (diabetic ketoacidoses) ([place])    recent/notes 01/06/2015  . Hyperlipidemia   . Hypertension   . Neuropathy   . Peripheral vascular disease ([place])   . Toe ulcer ([place])    left great toe/notes 08/23/2017  . Type II diabetes mellitus ([place])     MEDICATIONS AT HOME: Prior to Admission medications   Medication Sig Start Date End Date Taking? Authorizing Provider  atorvastatin (LIPITOR) 80 MG tablet Take 1 tablet (80 mg total) by mouth daily at 6 PM. 10/31/17  Yes Lorretta Harp, MD  Blood Glucose  Monitoring Suppl (TRUE METRIX METER) w/Device KIT Use as directed 06/07/17  Yes Ladell Pier, MD  clopidogrel (PLAVIX) 75 MG tablet Take 1 tablet (75 mg total) by mouth daily with breakfast. 01/10/18  Yes Lorretta Harp, MD  gabapentin (NEURONTIN) 300 MG capsule 2 caps PO Q a.m and 2 tabs PO Q afternoon and 3 tabs Q p.m 05/14/18  Yes Ladell Pier, MD  glucose blood (TRUE METRIX BLOOD GLUCOSE TEST) test strip Use as instructed 06/07/17  Yes Ladell Pier, MD  hydrALAZINE (APRESOLINE) 100 MG tablet Take 1 tablet (100 mg total) by mouth 3 (three) times daily. 02/13/17  Yes Langeland, Dawn T, MD  hydrochlorothiazide (HYDRODIURIL) 25 MG tablet TAKE 1 TABLET (25 MG TOTAL) BY MOUTH DAILY. 07/11/18  Yes Ladell Pier, MD  Insulin Glargine (LANTUS SOLOSTAR) 100 UNIT/ML Solostar Pen Inject 160 Units into the skin every morning. And pen needles 1/day 08/12/18  Yes Renato Shin, MD  loratadine (CLARITIN) 10 MG tablet Take 1 tablet (10 mg total) by mouth daily. 12/17/17  Yes Ladell Pier, MD  TRUEPLUS LANCETS 28G MISC Use as directed 05/14/17  Yes Tresa Garter, MD  TRUEPLUS PEN NEEDLES 31G X 5 MM MISC See admin instructions. 06/11/18  Yes [provider]  carvedilol (COREG) 6.25 MG tablet Take 1 tablet (6.25 mg total) by mouth 2 (two) times  daily with a meal. 08/28/18   Mattilynn Forrer, Dionne Bucy, PA-C  docusate sodium (COLACE) 100 MG capsule Take 1 capsule (100 mg total) by mouth daily. Patient not taking: Reported on 08/28/2018 09/02/17   Ladell Pier, MD  Multiple Vitamins-Minerals (MULTIVITAMIN ADULT) CHEW Chew 1 tablet by mouth daily.    [provider]  ondansetron (ZOFRAN ODT) 4 MG disintegrating tablet '4mg'$  ODT q4 hours prn nausea/vomit Patient not taking: Reported on 08/28/2018 05/24/18   Jacqlyn Larsen, PA-C  potassium chloride (K-DUR) 10 MEQ tablet Take 1 tablet (10 mEq total) by mouth daily. 12/03/17   Ladell Pier, MD     Objective:  EXAM:   Vitals:     08/28/18 1350  BP: (!) 156/76  Pulse: 65  Resp: 18  Temp: 98.1 F (36.7 C)  TempSrc: Oral  SpO2: 97%  Weight: 235 lb 3.2 oz (106.7 kg)  Height: 6' (1.829 m)    General appearance : A&OX3. NAD. Non-toxic-appearing HEENT: Atraumatic and Normocephalic.  PERRLA. EOM intact.  TM full/congested B. Mouth-MMM, post pharynx WNL w/o erythema, + PND. Turbinates enalrged Neck: supple, no JVD. No cervical lymphadenopathy. No thyromegaly Chest/Lungs:  Breathing-non-labored, Good air entry bilaterally, breath sounds normal without rales, rhonchi, or wheezing  CVS: S1 S2 regular, no murmurs, gallops, rubs  Extremities: Bilateral Lower Ext shows no edema, both legs are warm to touch with = pulse throughout Neurology:  CN II-XII grossly intact, Non focal.   Psych:  TP linear. J/I WNL. Normal speech. Appropriate eye contact and affect.  Skin:  No Rash  Data Review Lab Results  Component Value Date   HGBA1C 10.3 (A) 08/12/2018   HGBA1C 10.8 (A) 06/11/2018   HGBA1C 8.9 04/03/2018     Assessment & Plan   1. Fatigue, unspecified type - Vitamin D, 25-hydroxy - B12 and Folate Panel - TSH  2. Diabetes mellitus type 2 with complications, uncontrolled (HCC) Not controlled-continue with endocrine per their directions - Glucose (CBG)  3. Essential hypertension Check BP OOO and record and bring to next visit Uncontrolled-increase dose of carvedilol, continue hydralazine and HCTZ - carvedilol (COREG) 6.25 MG tablet; Take 1 tablet (6.25 mg total) by mouth 2 (two) times daily with a meal.  Dispense: 60 tablet; Refill: 3 - CBC with Differential/Platelet  4. Nasal congestion flonase ns.     Patient have been counseled extensively about nutrition and exercise  Return in about 3 weeks (around 09/18/2018) for Dr Johnson-recheck BP and fatigue.  The patient was given clear instructions to go to ER or return to medical center if symptoms don't improve, worsen or new problems develop. The patient  verbalized understanding. The patient was told to call to get lab results if they haven't heard anything in the next week.     Freeman Caldron, PA-C [facility], [place]   08/28/2018, 2:18 PM

## 2018-08-28 NOTE — Progress Notes (Signed)
Flu shot; yes  Pain:9, both legs and feet sharp, 55mnth  Multiviamin, patient requested for something else.

## 2018-08-29 ENCOUNTER — Other Ambulatory Visit: Payer: Self-pay | Admitting: Physician Assistant

## 2018-08-29 LAB — CBC WITH DIFFERENTIAL/PLATELET
BASOS: 0 %
Basophils Absolute: 0 10*3/uL (ref 0.0–0.2)
EOS (ABSOLUTE): 0.2 10*3/uL (ref 0.0–0.4)
Eos: 3 %
HEMATOCRIT: 36.9 % (ref 34.0–46.6)
HEMOGLOBIN: 12.4 g/dL (ref 11.1–15.9)
IMMATURE GRANULOCYTES: 0 %
Immature Grans (Abs): 0 10*3/uL (ref 0.0–0.1)
LYMPHS: 27 %
Lymphocytes Absolute: 1.9 10*3/uL (ref 0.7–3.1)
MCH: 29 pg (ref 26.6–33.0)
MCHC: 33.6 g/dL (ref 31.5–35.7)
MCV: 86 fL (ref 79–97)
Monocytes Absolute: 0.5 10*3/uL (ref 0.1–0.9)
Monocytes: 7 %
Neutrophils Absolute: 4.5 10*3/uL (ref 1.4–7.0)
Neutrophils: 63 %
Platelets: 210 10*3/uL (ref 150–450)
RBC: 4.27 x10E6/uL (ref 3.77–5.28)
RDW: 12.9 % (ref 12.3–15.4)
WBC: 7.2 10*3/uL (ref 3.4–10.8)

## 2018-08-29 LAB — B12 AND FOLATE PANEL
FOLATE: 5.9 ng/mL (ref >3.0)
VITAMIN B 12: 315 pg/mL (ref 232–1245)

## 2018-08-29 LAB — TSH: TSH: 1.37 u[IU]/mL (ref 0.450–4.500)

## 2018-08-29 LAB — VITAMIN D 25 HYDROXY (VIT D DEFICIENCY, FRACTURES): Vit D, 25-Hydroxy: 22.2 ng/mL — ABNORMAL LOW (ref 30.0–100.0)

## 2018-08-29 MED ORDER — VITAMIN D (ERGOCALCIFEROL) 1.25 MG (50000 UNIT) PO CAPS: 50000.0000 [IU] | ORAL_CAPSULE | ORAL | 0 refills | Status: DC

## 2018-08-29 MED FILL — VIT D2 1.25 MG (50,000 UNIT: 1.25 MG | 28 days supply | Qty: 4 | Fill #0

## 2018-08-30 ENCOUNTER — Telehealth: Payer: Self-pay

## 2018-08-30 NOTE — Telephone Encounter (Signed)
Patient was called, answered, verified dob, and was given most recent lab results. Patient verbalized understanding and had no further questions.

## 2018-08-30 NOTE — Telephone Encounter (Signed)
-----   Message from [place] Donovan, [place] sent at 08/29/2018  8:02 AM EDT ----- Please call patient and let them that their vitamin D is low.  This can contribute to muscle aches, anxiety, fatigue, and depression.  I have sent a prescription to the pharmacy for them to take once a week.  Her vitamin B12, folate, and Thyroid are normal.  She can continue her mult-vitamin daily as well.  We will recheck her bitamin D level in 3-4 months.  Thanks, Freeman Caldron, PA-C

## 2018-09-19 ENCOUNTER — Encounter: Payer: Self-pay | Admitting: Internal Medicine

## 2018-09-19 ENCOUNTER — Ambulatory Visit: Payer: Self-pay | Attending: Internal Medicine | Admitting: Internal Medicine

## 2018-09-19 VITALS — BP 169/72 | HR 65 | Temp 98.5°F | Resp 16 | Wt 233.8 lb

## 2018-09-19 DIAGNOSIS — Z794 Long term (current) use of insulin: Secondary | ICD-10-CM | POA: Insufficient documentation

## 2018-09-19 DIAGNOSIS — E041 Nontoxic single thyroid nodule: Secondary | ICD-10-CM | POA: Insufficient documentation

## 2018-09-19 DIAGNOSIS — M79604 Pain in right leg: Secondary | ICD-10-CM | POA: Insufficient documentation

## 2018-09-19 DIAGNOSIS — E1165 Type 2 diabetes mellitus with hyperglycemia: Secondary | ICD-10-CM | POA: Insufficient documentation

## 2018-09-19 DIAGNOSIS — E785 Hyperlipidemia, unspecified: Secondary | ICD-10-CM | POA: Insufficient documentation

## 2018-09-19 DIAGNOSIS — M79672 Pain in left foot: Secondary | ICD-10-CM | POA: Insufficient documentation

## 2018-09-19 DIAGNOSIS — R21 Rash and other nonspecific skin eruption: Secondary | ICD-10-CM | POA: Insufficient documentation

## 2018-09-19 DIAGNOSIS — Z8249 Family history of ischemic heart disease and other diseases of the circulatory system: Secondary | ICD-10-CM | POA: Insufficient documentation

## 2018-09-19 DIAGNOSIS — Z833 Family history of diabetes mellitus: Secondary | ICD-10-CM | POA: Insufficient documentation

## 2018-09-19 DIAGNOSIS — Z9049 Acquired absence of other specified parts of digestive tract: Secondary | ICD-10-CM | POA: Insufficient documentation

## 2018-09-19 DIAGNOSIS — Z89412 Acquired absence of left great toe: Secondary | ICD-10-CM | POA: Insufficient documentation

## 2018-09-19 DIAGNOSIS — IMO0002 Reserved for concepts with insufficient information to code with codable children: Secondary | ICD-10-CM

## 2018-09-19 DIAGNOSIS — M79671 Pain in right foot: Secondary | ICD-10-CM | POA: Insufficient documentation

## 2018-09-19 DIAGNOSIS — E118 Type 2 diabetes mellitus with unspecified complications: Secondary | ICD-10-CM

## 2018-09-19 DIAGNOSIS — M79605 Pain in left leg: Secondary | ICD-10-CM | POA: Insufficient documentation

## 2018-09-19 DIAGNOSIS — K219 Gastro-esophageal reflux disease without esophagitis: Secondary | ICD-10-CM | POA: Insufficient documentation

## 2018-09-19 DIAGNOSIS — Z79899 Other long term (current) drug therapy: Secondary | ICD-10-CM | POA: Insufficient documentation

## 2018-09-19 DIAGNOSIS — E11319 Type 2 diabetes mellitus with unspecified diabetic retinopathy without macular edema: Secondary | ICD-10-CM | POA: Insufficient documentation

## 2018-09-19 DIAGNOSIS — E1143 Type 2 diabetes mellitus with diabetic autonomic (poly)neuropathy: Secondary | ICD-10-CM | POA: Insufficient documentation

## 2018-09-19 DIAGNOSIS — I10 Essential (primary) hypertension: Secondary | ICD-10-CM | POA: Insufficient documentation

## 2018-09-19 DIAGNOSIS — F329 Major depressive disorder, single episode, unspecified: Secondary | ICD-10-CM | POA: Insufficient documentation

## 2018-09-19 DIAGNOSIS — E11649 Type 2 diabetes mellitus with hypoglycemia without coma: Secondary | ICD-10-CM

## 2018-09-19 DIAGNOSIS — Z7902 Long term (current) use of antithrombotics/antiplatelets: Secondary | ICD-10-CM | POA: Insufficient documentation

## 2018-09-19 DIAGNOSIS — E1151 Type 2 diabetes mellitus with diabetic peripheral angiopathy without gangrene: Secondary | ICD-10-CM | POA: Insufficient documentation

## 2018-09-19 DIAGNOSIS — E1142 Type 2 diabetes mellitus with diabetic polyneuropathy: Secondary | ICD-10-CM

## 2018-09-19 DIAGNOSIS — R600 Localized edema: Secondary | ICD-10-CM | POA: Insufficient documentation

## 2018-09-19 LAB — GLUCOSE, POCT (MANUAL RESULT ENTRY)
POC Glucose: 88 mg/dl (ref 70–99)
POC Glucose: 106 mg/dl — AB (ref 70–99)

## 2018-09-19 MED ORDER — TRIAMCINOLONE ACETONIDE 0.1 % EX CREA: 1.0000 "application " | TOPICAL_CREAM | Freq: Two times a day (BID) | CUTANEOUS | 0 refills | Status: DC

## 2018-09-19 MED ORDER — TRUEPLUS LANCETS 28G MISC: 6 refills | Status: DC

## 2018-09-19 MED ORDER — GLUCOSE BLOOD VI STRP: ORAL_STRIP | 12 refills | Status: DC

## 2018-09-19 MED ORDER — GABAPENTIN 300 MG PO CAPS: ORAL_CAPSULE | ORAL | 5 refills | Status: DC

## 2018-09-19 MED ORDER — TRUE METRIX METER W/DEVICE KIT: PACK | 0 refills | Status: DC

## 2018-09-19 MED FILL — !TRUE METRIX BLOOD GLUCOSE: 30 days supply | Qty: 1 | Fill #0

## 2018-09-19 MED FILL — TRIAMCINOLONE ACETONIDE 0.1: 0.1 | 30 days supply | Qty: 30 | Fill #0

## 2018-09-19 NOTE — Patient Instructions (Addendum)
Please give patient an appointment with the clinical pharmacist in 2 weeks for repeat blood pressure check.  Please check your blood pressure at least twice a week and write down the readings.  Please bring that log with you when you see the clinical pharmacist in 2 weeks.  You should try to eat your 3 meals a day on time.  You can snack on fruits between meals.

## 2018-09-19 NOTE — Progress Notes (Signed)
Pt states her legs and feet are hurting  Pt states no matter what she takes they stay hurting

## 2018-09-19 NOTE — Progress Notes (Signed)
Patient ID: Katherine [name], female    DOB: 03-15-1956  MRN: [id number]  CC: Diabetes   Subjective: Katherine [name] is a 62 y.o. female who presents for chronic ds management.  I last saw her in July. Her concerns today include:  Hx of HTN, DM with neuropathy, retinopathyand possible gastroparesis, HL, depression, GERD, PAD, s/p amputation LT 1st toe.  HTN:  Norvasc d/c on last visit to see if LE edema would dec.  Changed to Coreg.  She has had some decrease edema of the legs.  She is wearing some compression socks which also helps.  She limits salt in the foods.  She is not checking blood pressure.  Reports compliance with medications.  Medications include hydralazine, HCTZ, carvedilol  DM:  Sees Dr. Loanne [name].  Not checking BS "because I do not trust the machine."  Feels she needs a new glucometer.  On Lantus 160 units. Reports low BS 2 x a wk.  Feels BS low when BS is below 100.  She was told by the endocrinologist to call his office and let him know when she is getting low blood sugars but patient states that most of the episodes occur on the weekends. Eating 5 smaller meals a day.  Blood sugar low in the office today.  Patient has not eaten anything since breakfast and is now 3 PM. -She continues to have pain in the legs and feet due to diabetic neuropathy.  She is needing refill on gabapentin. Patient Active Problem List   Diagnosis Date Noted  . Diabetic polyneuropathy associated with type 2 diabetes mellitus ([place]) 05/14/2018  . Thyroid nodule 10/04/2017  . Microcytic anemia 09/11/2017  . Critical lower limb ischemia 09/10/2017  . Retinopathy 08/22/2017  . Thyroid enlargement 07/12/2017  . Dyslipidemia 09/20/2016  . DEPRESSION 09/10/2009  . LEG CRAMPS 05/19/2009  . GERD 02/15/2009  . Gastroparesis 09/03/2008  . DENTAL CARIES 07/14/2008  . Diabetes mellitus type 2 with complications, uncontrolled ([place]) 09/16/2003  . Essential hypertension 09/16/2003     Current Outpatient  Medications on File Prior to Visit  Medication Sig Dispense Refill  . atorvastatin (LIPITOR) 80 MG tablet Take 1 tablet (80 mg total) by mouth daily at 6 PM. 30 tablet 6  . Blood Glucose Monitoring Suppl (TRUE METRIX METER) w/Device KIT Use as directed 1 kit 0  . carvedilol (COREG) 6.25 MG tablet Take 1 tablet (6.25 mg total) by mouth 2 (two) times daily with a meal. 60 tablet 3  . clopidogrel (PLAVIX) 75 MG tablet Take 1 tablet (75 mg total) by mouth daily with breakfast. 90 tablet 1  . docusate sodium (COLACE) 100 MG capsule Take 1 capsule (100 mg total) by mouth daily. (Patient not taking: Reported on 08/28/2018) 30 capsule 4  . fluticasone (FLONASE) 50 MCG/ACT nasal spray Place 2 sprays into both nostrils daily. 16 g 6  . glucose blood (TRUE METRIX BLOOD GLUCOSE TEST) test strip Use as instructed 100 each 12  . hydrALAZINE (APRESOLINE) 100 MG tablet Take 1 tablet (100 mg total) by mouth 3 (three) times daily. 90 tablet 2  . hydrochlorothiazide (HYDRODIURIL) 25 MG tablet TAKE 1 TABLET (25 MG TOTAL) BY MOUTH DAILY. 30 tablet 2  . Insulin Glargine (LANTUS SOLOSTAR) 100 UNIT/ML Solostar Pen Inject 160 Units into the skin every morning. And pen needles 1/day 20 pen 2  . loratadine (CLARITIN) 10 MG tablet Take 1 tablet (10 mg total) by mouth daily. 30 tablet 11  . Multiple Vitamins-Minerals (MULTIVITAMIN ADULT)  CHEW Chew 1 tablet by mouth daily.    . ondansetron (ZOFRAN ODT) 4 MG disintegrating tablet 19m ODT q4 hours prn nausea/vomit (Patient not taking: Reported on 08/28/2018) 10 tablet 0  . potassium chloride (K-DUR) 10 MEQ tablet Take 1 tablet (10 mEq total) by mouth daily. 30 tablet 6  . TRUEPLUS LANCETS 28G MISC Use as directed 100 each 12  . TRUEPLUS PEN NEEDLES 31G X 5 MM MISC See admin instructions.  11  . Vitamin D, Ergocalciferol, (DRISDOL) 50000 units CAPS capsule Take 1 capsule (50,000 Units total) by mouth every 7 (seven) days. 16 capsule 0   No current facility-administered medications  on file prior to visit.     Allergies  Allergen Reactions  . Other Swelling    Seaweed= swelling on arms, hands and face  . Metformin And Related Nausea Only    Social History   Socioeconomic History  . Marital status: Married    Spouse name: Not on file  . Number of children: Not on file  . Years of education: Not on file  . Highest education level: Not on file  Occupational History  . Not on file  Social Needs  . Financial resource strain: Not on file  . Food insecurity:    Worry: Not on file    Inability: Not on file  . Transportation needs:    Medical: Not on file    Non-medical: Not on file  Tobacco Use  . Smoking status: Never Smoker  . Smokeless tobacco: Never Used  Substance and Sexual Activity  . Alcohol use: No  . Drug use: No  . Sexual activity: Never  Lifestyle  . Physical activity:    Days per week: Not on file    Minutes per session: Not on file  . Stress: Not on file  Relationships  . Social connections:    Talks on phone: Not on file    Gets together: Not on file    Attends religious service: Not on file    Active member of club or organization: Not on file    Attends meetings of clubs or organizations: Not on file    Relationship status: Not on file  . Intimate partner violence:    Fear of current or ex partner: Not on file    Emotionally abused: Not on file    Physically abused: Not on file    Forced sexual activity: Not on file  Other Topics Concern  . Not on file  Social History Narrative  . Not on file    Family History  Problem Relation Age of Onset  . Diabetes Father   . Hypertension Father   . Kidney disease Mother   . Hypertension Mother   . Cancer Maternal Grandmother   . Hypertension Maternal Grandfather   . Diabetes Paternal Aunt   . Diabetes Paternal Uncle   . Diabetes Paternal Grandfather   . Colon cancer Neg Hx   . Breast cancer Neg Hx     Past Surgical History:  Procedure Laterality Date  . AMPUTATION TOE Left  10/26/2017   Procedure: AMPUTATION PARTIAL RAY LEFT FOOT AND IRRIGATION/DEBRIDEMENT;  Surgeon: EEdrick Kins DPM;  Location: [facility]  Service: Podiatry;  Laterality: Left;  AMPUTATION PARTIAL RAY LEFT FOOT AND IRRIGATION/DEBRIDEMENT  . CHOLECYSTECTOMY  01/24/2003   /Archie Endo5/15/2012  . COLONOSCOPY    . INCISION AND DRAINAGE ABSCESS  03/17/2012   "right but; left lower abdoment"/notes 03/17/2012  . LOWER EXTREMITY INTERVENTION N/A 09/10/2017  Procedure: LOWER EXTREMITY INTERVENTION;  Surgeon: Lorretta Harp, MD;  Location: [facility];  Service: Cardiovascular;  Laterality: N/A;  . PERIPHERAL VASCULAR ATHERECTOMY  09/10/2017   Procedure: PERIPHERAL VASCULAR ATHERECTOMY;  Surgeon: Lorretta Harp, MD;  Location: [facility];  Service: Cardiovascular;;  left AT    ROS: Review of Systems Derm: Has a small nodule on the lower back which she has had since the summer.  She thinks it may have been a bug bite.  It itches intermittently.  The itching had gone away for a while but now it started back again.  PHYSICAL EXAM: BP (!) 169/72   Pulse 65   Temp 98.5 F (36.9 C) (Oral)   Resp 16   Wt 233 lb 12.8 oz (106.1 kg)   SpO2 97%   BMI 31.71 kg/m   Wt Readings from Last 3 Encounters:  09/19/18 233 lb 12.8 oz (106.1 kg)  08/28/18 235 lb 3.2 oz (106.7 kg)  08/12/18 231 lb 11.2 oz (105.1 kg)  Repeat blood pressure 150/80 Physical Exam  General appearance - alert, well appearing, and in no distress Mental status - normal mood, behavior, speech, dress, motor activity, and thought processes Mouth - mucous membranes moist, pharynx normal without lesions Chest - clear to auscultation, no wheezes, rales or rhonchi, symmetric air entry Heart - normal rate, regular rhythm, normal S1, S2, no murmurs, rubs, clicks or gallops Extremities -trace lower extremity edema.  Nonpitting edema in both ankles. Skin: 1 cm hyperpigmented raised area in the lower back to the right of the  midline  Results for orders placed or performed in visit on 09/19/18  POCT glucose (manual entry)  Result Value Ref Range   POC Glucose 88 70 - 99 mg/dl  POCT glucose (manual entry)  Result Value Ref Range   POC Glucose 106 (A) 70 - 99 mg/dl   Lab Results  Component Value Date   HGBA1C 10.3 (A) 08/12/2018    ASSESSMENT AND PLAN:  1. Essential hypertension Not at goal.  He is on maximum dose of hydralazine and I do not want to increase the carvedilol as I think it would drop her pulse rate too low.  We discussed having her check blood pressure twice a week and record the readings.  She will follow-up in 2 weeks with our clinical pharmacist.  If blood pressure not at goal of 130/80 or lower we may need to add back amlodipine at a low dose of 5 mg  2. Edema of both legs Improved.  3. Diabetes mellitus type 2 with complications, uncontrolled ([place]) Encourage patient to check blood sugars at least 2-3 times a day.  Prescription for diabetic supplies sent to the pharmacy including a new meter Encouraged her to eat meals on time. Encourage her to call her endocrinologist if she has concerns about frequent hypoglycemia.  He has told her to do so. Patient given a small soda today in the office with subsequent increase in blood sugar 106 prior to discharge - POCT glucose (manual entry) - Microalbumin / creatinine urine ratio - Blood Glucose Monitoring Suppl (TRUE METRIX METER) w/Device KIT; Use as directed  Dispense: 1 kit; Refill: 0 - glucose blood (TRUE METRIX BLOOD GLUCOSE TEST) test strip; Use as instructed  Dispense: 100 each; Refill: 12 - TRUEPLUS LANCETS 28G MISC; Use as directed  Dispense: 100 each; Refill: 6 - POCT glucose (manual entry)  4. Diabetic polyneuropathy associated with type 2 diabetes mellitus ([facility]) - gabapentin (NEURONTIN)  300 MG capsule; 2 caps PO Q a.m and 2 tabs PO Q afternoon and 3 tabs Q p.m  Dispense: 210 capsule; Refill: 5  5. Hypoglycemia associated with  diabetes ([place]) See #3 above.  Advised patient to avoid skipping meals  6. Rash of back - triamcinolone cream (KENALOG) 0.1 %; Apply 1 application topically 2 (two) times daily.  Dispense: 30 g; Refill: 0   Patient was given the opportunity to ask questions.  Patient verbalized understanding of the plan and was able to repeat key elements of the plan.   Orders Placed This Encounter  Procedures  . Microalbumin / creatinine urine ratio  . POCT glucose (manual entry)  . POCT glucose (manual entry)     Requested Prescriptions   Signed Prescriptions Disp Refills  . gabapentin (NEURONTIN) 300 MG capsule 210 capsule 5    Sig: 2 caps PO Q a.m and 2 tabs PO Q afternoon and 3 tabs Q p.m  . triamcinolone cream (KENALOG) 0.1 % 30 g 0    Sig: Apply 1 application topically 2 (two) times daily.  . Blood Glucose Monitoring Suppl (TRUE METRIX METER) w/Device KIT 1 kit 0    Sig: Use as directed  . glucose blood (TRUE METRIX BLOOD GLUCOSE TEST) test strip 100 each 12    Sig: Use as instructed  . TRUEPLUS LANCETS 28G MISC 100 each 6    Sig: Use as directed    Return in about 6 weeks (around 10/31/2018).  Karle Plumber, MD, FACP

## 2018-09-20 LAB — MICROALBUMIN / CREATININE URINE RATIO
Creatinine, Urine: 170.8 mg/dL
MICROALB/CREAT RATIO: 118.5 mg/g{creat} — AB (ref 0.0–30.0)
MICROALBUM., U, RANDOM: 202.4 ug/mL

## 2018-09-27 MED FILL — HYDROCHLOROTHIAZIDE 25 MG T: 25 | 30 days supply | Qty: 30 | Fill #1

## 2018-09-27 MED FILL — VIT D2 1.25 MG (50,000 UNIT: 1.25 MG | 28 days supply | Qty: 4 | Fill #1

## 2018-09-30 MED FILL — LANTUS SOLOSTAR 100 UNITS/M: 100 | 15 days supply | Qty: 24 | Fill #1

## 2018-10-03 ENCOUNTER — Encounter: Payer: Self-pay | Admitting: Pharmacist

## 2018-10-08 ENCOUNTER — Other Ambulatory Visit: Payer: Self-pay

## 2018-10-08 ENCOUNTER — Ambulatory Visit: Payer: Self-pay | Attending: Internal Medicine | Admitting: Pharmacist

## 2018-10-08 ENCOUNTER — Encounter: Payer: Self-pay | Admitting: Pharmacist

## 2018-10-08 VITALS — BP 171/72 | HR 67

## 2018-10-08 DIAGNOSIS — Z79899 Other long term (current) drug therapy: Secondary | ICD-10-CM | POA: Insufficient documentation

## 2018-10-08 DIAGNOSIS — I10 Essential (primary) hypertension: Secondary | ICD-10-CM | POA: Insufficient documentation

## 2018-10-08 MED ORDER — AMLODIPINE BESYLATE 5 MG PO TABS: 5.0000 mg | ORAL_TABLET | Freq: Every day | ORAL | 3 refills | Status: DC

## 2018-10-08 NOTE — Progress Notes (Signed)
   S:    PCP: Dr. Wynetta Emery  Patient arrives in good spirits. Presents to the clinic for hypertension management. Patient was referred by Dr. Wynetta Emery on 09/19/18.  BP 169/72 at that visit. No changes were made to her medications.  Today, pt denies chest pain, shortness of breath, headache, or blurred vision. Denies BLE edema.   Patient reports adherence with medications.  Current BP Medications include:   - carvedilol 6.25 mg BID - hydralazine 100 mg TID - HCTZ 25 mg daily  Antihypertensives tried in the past include: amlodipine 10 mg, carvedilol 3.125 mg BID, hydralazine 10, 25 and 50 mg TID, irbesartan 150 mg, lisinopril 40mg , losartan 25, 50, 100 mg, Benicar-HCT 20-12.5 mg, spironolactone 25 mg, and Diovan-HCT 160-25 mg.   Dietary habits include: limits salt, denies drinking caffeine Exercise habits include: most of her exercise comes from housework Family / Social history: HTN (mother and father), DM (father), kidney disease (mother), never smoker, denies drinking  Home BP readings: denies taking at home  O:  L arm after 5 minutes rest: 171/72, HR 67  Last 3 Office BP readings: BP Readings from Last 3 Encounters:  09/19/18 (!) 169/72  08/28/18 (!) 156/76  08/12/18 132/88   BMET    Component Value Date/Time   NA 141 05/24/2018 1239   NA 142 10/23/2017 1452   K 3.8 05/24/2018 1239   CL 102 05/24/2018 1239   CO2 31 05/24/2018 1239   GLUCOSE 103 (H) 05/24/2018 1239   BUN 16 05/24/2018 1239   BUN 13 10/23/2017 1452   CREATININE 0.99 05/24/2018 1239   CREATININE 0.88 09/29/2015 1712   CALCIUM 9.1 05/24/2018 1239   GFRNONAA 60 (L) 05/24/2018 1239   GFRNONAA >89 01/06/2015 1621   GFRAA >60 05/24/2018 1239   GFRAA >89 01/06/2015 1621   Renal function: CrCl cannot be calculated (Patient's most recent lab result is older than the maximum 21 days allowed.).  A/P: Hypertension longstanding currently uncontrolled on medications. BP Goal <130/80 mmHg. Patient reports  adherence with current medications. However, I reviewed her pharmacy records here and she has not filled her hydralazine since 09/2017. I called her other pharmacy ([facility] on [address]) and they denied filling it. Strongly encouraged adherence.  -Continued HCTZ, hydralazine, and carvedilol.  -Sent rx for amlodipine 5 mg daily - pt does not wish to pick up at this time -Counseled on lifestyle modifications for blood pressure control including reduced dietary sodium, increased exercise, adequate sleep  Results reviewed and written information provided. Total time in face-to-face counseling 15 minutes.   F/U w/ PCP 10/24/18.    Benard Halsted, PharmD, [place] [phone]

## 2018-10-08 NOTE — Patient Instructions (Signed)
Thank you for coming to see [place] today.   Blood pressure today is elevated.   Continue taking medications and start amlodipine.   Limiting salt and caffeine, as well as exercising as able for at least 30 minutes for 5 days out of the week, can also help you lower your blood pressure.  Take your blood pressure at home if you are able. Please write down these numbers and bring them to your visits.  If you have any questions about medications, please call me [phone].  Lurena Joiner

## 2018-10-14 ENCOUNTER — Telehealth: Payer: Self-pay | Admitting: Endocrinology

## 2018-10-14 ENCOUNTER — Ambulatory Visit: Payer: Self-pay | Admitting: Endocrinology

## 2018-10-14 DIAGNOSIS — Z0289 Encounter for other administrative examinations: Secondary | ICD-10-CM

## 2018-10-14 NOTE — Telephone Encounter (Signed)
error 

## 2018-10-24 ENCOUNTER — Ambulatory Visit: Payer: Self-pay | Attending: Internal Medicine | Admitting: Internal Medicine

## 2018-10-24 ENCOUNTER — Encounter: Payer: Self-pay | Admitting: Internal Medicine

## 2018-10-24 VITALS — BP 193/81 | HR 75 | Temp 97.9°F | Resp 16 | Wt 239.6 lb

## 2018-10-24 DIAGNOSIS — I10 Essential (primary) hypertension: Secondary | ICD-10-CM | POA: Insufficient documentation

## 2018-10-24 DIAGNOSIS — Z89422 Acquired absence of other left toe(s): Secondary | ICD-10-CM | POA: Insufficient documentation

## 2018-10-24 DIAGNOSIS — Z833 Family history of diabetes mellitus: Secondary | ICD-10-CM | POA: Insufficient documentation

## 2018-10-24 DIAGNOSIS — IMO0002 Reserved for concepts with insufficient information to code with codable children: Secondary | ICD-10-CM

## 2018-10-24 DIAGNOSIS — E041 Nontoxic single thyroid nodule: Secondary | ICD-10-CM | POA: Insufficient documentation

## 2018-10-24 DIAGNOSIS — E1142 Type 2 diabetes mellitus with diabetic polyneuropathy: Secondary | ICD-10-CM | POA: Insufficient documentation

## 2018-10-24 DIAGNOSIS — E11649 Type 2 diabetes mellitus with hypoglycemia without coma: Secondary | ICD-10-CM | POA: Insufficient documentation

## 2018-10-24 DIAGNOSIS — Z9049 Acquired absence of other specified parts of digestive tract: Secondary | ICD-10-CM | POA: Insufficient documentation

## 2018-10-24 DIAGNOSIS — Z8249 Family history of ischemic heart disease and other diseases of the circulatory system: Secondary | ICD-10-CM | POA: Insufficient documentation

## 2018-10-24 DIAGNOSIS — F329 Major depressive disorder, single episode, unspecified: Secondary | ICD-10-CM | POA: Insufficient documentation

## 2018-10-24 DIAGNOSIS — Z79899 Other long term (current) drug therapy: Secondary | ICD-10-CM | POA: Insufficient documentation

## 2018-10-24 DIAGNOSIS — D509 Iron deficiency anemia, unspecified: Secondary | ICD-10-CM | POA: Insufficient documentation

## 2018-10-24 DIAGNOSIS — E785 Hyperlipidemia, unspecified: Secondary | ICD-10-CM | POA: Insufficient documentation

## 2018-10-24 DIAGNOSIS — E1165 Type 2 diabetes mellitus with hyperglycemia: Secondary | ICD-10-CM

## 2018-10-24 DIAGNOSIS — E559 Vitamin D deficiency, unspecified: Secondary | ICD-10-CM | POA: Insufficient documentation

## 2018-10-24 LAB — GLUCOSE, POCT (MANUAL RESULT ENTRY): POC GLUCOSE: 167 mg/dL — AB (ref 70–99)

## 2018-10-24 MED ORDER — VITAMIN D (ERGOCALCIFEROL) 1.25 MG (50000 UNIT) PO CAPS: 50000.0000 [IU] | ORAL_CAPSULE | ORAL | 0 refills | Status: DC

## 2018-10-24 MED ORDER — METFORMIN HCL ER (MOD) 500 MG PO TB24: ORAL_TABLET | ORAL | 1 refills | Status: DC

## 2018-10-24 MED ORDER — AMLODIPINE BESYLATE 10 MG PO TABS: 10.0000 mg | ORAL_TABLET | Freq: Every day | ORAL | 3 refills | Status: DC

## 2018-10-24 MED ORDER — INSULIN GLARGINE 100 UNIT/ML SOLOSTAR PEN: 154.0000 [IU] | PEN_INJECTOR | SUBCUTANEOUS | 2 refills | Status: DC

## 2018-10-24 MED ORDER — SPIRONOLACTONE 25 MG PO TABS: 12.5000 mg | ORAL_TABLET | Freq: Every day | ORAL | 3 refills | Status: DC

## 2018-10-24 MED FILL — VIT D2 1.25 MG (50,000 UNIT: 1.25 MG | 28 days supply | Qty: 4 | Fill #0

## 2018-10-24 MED FILL — METFORMIN HCL ER 500 MG TAB: 500 | 30 days supply | Qty: 60 | Fill #0

## 2018-10-24 MED FILL — AMLODIPINE BESYLATE 10 MG T: 10 | 30 days supply | Qty: 30 | Fill #0

## 2018-10-24 MED FILL — SPIRONOLACTONE 25 MG TABLET: 25 | 30 days supply | Qty: 15 | Fill #0

## 2018-10-24 NOTE — Patient Instructions (Addendum)
Your blood pressure is still not controlled.  You should be on amlodipine 10 mg daily, carvedilol 6.25 mg twice a day, hydralazine and hydrochlorothiazide 25 mg daily.  We have added a new medication called Spironolactone which you will take daily.  Stop the potassium supplement.  Please continue to monitor your blood pressure at least twice a week and bring in readings on next visit.  For your diabetes, decrease the Lantus to 154 units daily.  We have added metformin 500 mg daily.  Take the 500 mg daily for 2 weeks.  If you tolerate this, after 2 weeks increase the dose to 2 tablets daily.  Please return to the lab in 1 week to have blood draw to check your kidney function.

## 2018-10-24 NOTE — Progress Notes (Signed)
Patient ID: Katherine [name], female    DOB: [birth year]  MRN: [id number]  CC: Follow-up   Subjective: Katherine [name] is a 62 y.o. female who presents for follow-up on blood pressure Her concerns today include:  Hx of HTN, DM with neuropathy, retinopathyand possible gastroparesis, HL, depression, GERD, PAD, s/p amputation LT 1st toe.  Since last visit with me 5 weeks ago, she has seen the clinical pharmacist for follow-up on blood pressure.  Blood pressure was elevated.  He added a low-dose of amlodipine.  However patient states that she had the 10 mg amlodipine tablets at home from when I had previously prescribed it and so she started taking 1 of those daily -reports compliance with meds and took meds already for today. Checks BP 2 x a wk.  Reports SBP always high  DM:  Loves to eat a candy bar after her dinner Had appt with Dr. Loanne Drilling last Thursday but missed appt because BS had dropped low and she had to go back in the house to eat something.   She does not want to go back to Dr. Loanne Drilling. Feels he does not answer any additional questions or concerns she may have. She is wanting to get off insulin -reports low BS almost daily in the past wk.  Lowest in the 70s. Checks BS BID - in a.m before BF 101-180 and before/sometimes after dinner usually under 150.    Patient is requesting refill on vitamin D. Patient Active Problem List   Diagnosis Date Noted  . Diabetic polyneuropathy associated with type 2 diabetes mellitus ([place]) 05/14/2018  . Thyroid nodule 10/04/2017  . Microcytic anemia 09/11/2017  . Critical lower limb ischemia 09/10/2017  . Retinopathy 08/22/2017  . Thyroid enlargement 07/12/2017  . Dyslipidemia 09/20/2016  . DEPRESSION 09/10/2009  . LEG CRAMPS 05/19/2009  . GERD 02/15/2009  . Gastroparesis 09/03/2008  . DENTAL CARIES 07/14/2008  . Diabetes mellitus type 2 with complications, uncontrolled ([place]) 09/16/2003  . Essential hypertension 09/16/2003     Current  Outpatient Medications on File Prior to Visit  Medication Sig Dispense Refill  . atorvastatin (LIPITOR) 80 MG tablet Take 1 tablet (80 mg total) by mouth daily at 6 PM. 30 tablet 6  . Blood Glucose Monitoring Suppl (TRUE METRIX METER) w/Device KIT Use as directed 1 kit 0  . Blood Glucose Monitoring Suppl (TRUE METRIX METER) w/Device KIT Use as directed 1 kit 0  . carvedilol (COREG) 6.25 MG tablet Take 1 tablet (6.25 mg total) by mouth 2 (two) times daily with a meal. 60 tablet 3  . clopidogrel (PLAVIX) 75 MG tablet Take 1 tablet (75 mg total) by mouth daily with breakfast. 90 tablet 1  . fluticasone (FLONASE) 50 MCG/ACT nasal spray Place 2 sprays into both nostrils daily. 16 g 6  . gabapentin (NEURONTIN) 300 MG capsule 2 caps PO Q a.m and 2 tabs PO Q afternoon and 3 tabs Q p.m 210 capsule 5  . glucose blood (TRUE METRIX BLOOD GLUCOSE TEST) test strip Use as instructed 100 each 12  . glucose blood (TRUE METRIX BLOOD GLUCOSE TEST) test strip Use as instructed 100 each 12  . hydrALAZINE (APRESOLINE) 100 MG tablet Take 1 tablet (100 mg total) by mouth 3 (three) times daily. 90 tablet 2  . hydrochlorothiazide (HYDRODIURIL) 25 MG tablet TAKE 1 TABLET (25 MG TOTAL) BY MOUTH DAILY. 30 tablet 2  . loratadine (CLARITIN) 10 MG tablet Take 1 tablet (10 mg total) by mouth daily. 30 tablet 11  .  Multiple Vitamins-Minerals (MULTIVITAMIN ADULT) CHEW Chew 1 tablet by mouth daily.    . ondansetron (ZOFRAN ODT) 4 MG disintegrating tablet 69m ODT q4 hours prn nausea/vomit (Patient not taking: Reported on 08/28/2018) 10 tablet 0  . triamcinolone cream (KENALOG) 0.1 % Apply 1 application topically 2 (two) times daily. 30 g 0  . TRUEPLUS LANCETS 28G MISC Use as directed 100 each 12  . TRUEPLUS LANCETS 28G MISC Use as directed 100 each 6  . TRUEPLUS PEN NEEDLES 31G X 5 MM MISC See admin instructions.  11   No current facility-administered medications on file prior to visit.     Allergies  Allergen Reactions  .  Other Swelling    Seaweed= swelling on arms, hands and face  . Metformin And Related Nausea Only    Social History   Socioeconomic History  . Marital status: Married    Spouse name: Not on file  . Number of children: Not on file  . Years of education: Not on file  . Highest education level: Not on file  Occupational History  . Not on file  Social Needs  . Financial resource strain: Not on file  . Food insecurity:    Worry: Not on file    Inability: Not on file  . Transportation needs:    Medical: Not on file    Non-medical: Not on file  Tobacco Use  . Smoking status: Never Smoker  . Smokeless tobacco: Never Used  Substance and Sexual Activity  . Alcohol use: No  . Drug use: No  . Sexual activity: Never  Lifestyle  . Physical activity:    Days per week: Not on file    Minutes per session: Not on file  . Stress: Not on file  Relationships  . Social connections:    Talks on phone: Not on file    Gets together: Not on file    Attends religious service: Not on file    Active member of club or organization: Not on file    Attends meetings of clubs or organizations: Not on file    Relationship status: Not on file  . Intimate partner violence:    Fear of current or ex partner: Not on file    Emotionally abused: Not on file    Physically abused: Not on file    Forced sexual activity: Not on file  Other Topics Concern  . Not on file  Social History Narrative  . Not on file    Family History  Problem Relation Age of Onset  . Diabetes Father   . Hypertension Father   . Kidney disease Mother   . Hypertension Mother   . Cancer Maternal Grandmother   . Hypertension Maternal Grandfather   . Diabetes Paternal Aunt   . Diabetes Paternal Uncle   . Diabetes Paternal Grandfather   . Colon cancer Neg Hx   . Breast cancer Neg Hx     Past Surgical History:  Procedure Laterality Date  . AMPUTATION TOE Left 10/26/2017   Procedure: AMPUTATION PARTIAL RAY LEFT FOOT AND  IRRIGATION/DEBRIDEMENT;  Surgeon: EEdrick Kins DPM;  Location: [facility]  Service: Podiatry;  Laterality: Left;  AMPUTATION PARTIAL RAY LEFT FOOT AND IRRIGATION/DEBRIDEMENT  . CHOLECYSTECTOMY  01/24/2003   /Archie Endo5/15/2012  . COLONOSCOPY    . INCISION AND DRAINAGE ABSCESS  03/17/2012   "right but; left lower abdoment"/notes 03/17/2012  . LOWER EXTREMITY INTERVENTION N/A 09/10/2017   Procedure: LOWER EXTREMITY INTERVENTION;  Surgeon: BLorretta Harp  MD;  Location: [facility];  Service: Cardiovascular;  Laterality: N/A;  . PERIPHERAL VASCULAR ATHERECTOMY  09/10/2017   Procedure: PERIPHERAL VASCULAR ATHERECTOMY;  Surgeon: Lorretta Harp, MD;  Location: [facility];  Service: Cardiovascular;;  left AT    ROS: Review of Systems Negative except as stated above PHYSICAL EXAM: BP (!) 193/81   Pulse 75   Temp 97.9 F (36.6 C) (Oral)   Resp 16   Wt 239 lb 9.6 oz (108.7 kg)   SpO2 98%   BMI 32.50 kg/m   Wt Readings from Last 3 Encounters:  10/24/18 239 lb 9.6 oz (108.7 kg)  09/19/18 233 lb 12.8 oz (106.1 kg)  08/28/18 235 lb 3.2 oz (106.7 kg)    Physical Exam  General appearance - alert, well appearing, and in no distress Mental status - normal mood, behavior, speech, dress, motor activity, and thought processes Neck - supple, no significant adenopathy Chest - clear to auscultation, no wheezes, rales or rhonchi, symmetric air entry Heart - normal rate, regular rhythm, normal S1, S2, no murmurs, rubs, clicks or gallops Extremities -trace ankle edema   Results for orders placed or performed in visit on 10/24/18  POCT glucose (manual entry)  Result Value Ref Range   POC Glucose 167 (A) 70 - 99 mg/dl   Lab Results  Component Value Date   HGBA1C 10.3 (A) 08/12/2018    ASSESSMENT AND PLAN: 1. Essential hypertension Not at goal I have up dated Norvasc to reflect 10 mg  Daily. Stop Potassium.   Add Spironolactone Return to lab in 1 wk for BMP -  spironolactone (ALDACTONE) 25 MG tablet; Take 0.5 tablets (12.5 mg total) by mouth daily.  Dispense: 45 tablet; Refill: 3 - Basic Metabolic Panel; Future - amLODipine (NORVASC) 10 MG tablet; Take 1 tablet (10 mg total) by mouth daily.  Dispense: 90 tablet; Refill: 3  2. Uncontrolled type 2 diabetes mellitus with peripheral neuropathy ([place]) 3. Hypoglycemia due to type 2 diabetes mellitus ([place]) Given her hypoglycemic episodes, I recommend decreasing Lantus to 154 units daily.  We discussed adding an oral medication.  She is willing to give metformin a try again.  We will try her with the extended release form to see if she tolerates.  She will take 500 mg daily for 2 weeks and then increase to thousand milligrams daily if she tolerates. - POCT glucose (manual entry) - metFORMIN (GLUMETZA) 500 MG (MOD) 24 hr tablet; 1 tab PO daily x 2 wks then 2 tabs PO daily  Dispense: 60 tablet; Refill: 1 - Insulin Glargine (LANTUS SOLOSTAR) 100 UNIT/ML Solostar Pen; Inject 154 Units into the skin every morning. And pen needles 1/day  Dispense: 20 pen; Refill: 2   4. Vitamin D deficiency - Vitamin D, Ergocalciferol, (DRISDOL) 1.25 MG (50000 UT) CAPS capsule; Take 1 capsule (50,000 Units total) by mouth every 7 (seven) days.  Dispense: 16 capsule; Refill: 0 - VITAMIN D 25 Hydroxy (Vit-D Deficiency, Fractures); Future    Patient was given the opportunity to ask questions.  Patient verbalized understanding of the plan and was able to repeat key elements of the plan.   Orders Placed This Encounter  Procedures  . Basic Metabolic Panel  . VITAMIN D 25 Hydroxy (Vit-D Deficiency, Fractures)  . POCT glucose (manual entry)     Requested Prescriptions   Signed Prescriptions Disp Refills  . spironolactone (ALDACTONE) 25 MG tablet 45 tablet 3    Sig: Take 0.5 tablets (12.5 mg total)  by mouth daily.  . Vitamin D, Ergocalciferol, (DRISDOL) 1.25 MG (50000 UT) CAPS capsule 16 capsule 0    Sig: Take 1 capsule (50,000  Units total) by mouth every 7 (seven) days.  Marland Kitchen amLODipine (NORVASC) 10 MG tablet 90 tablet 3    Sig: Take 1 tablet (10 mg total) by mouth daily.  . metFORMIN (GLUMETZA) 500 MG (MOD) 24 hr tablet 60 tablet 1    Sig: 1 tab PO daily x 2 wks then 2 tabs PO daily  . Insulin Glargine (LANTUS SOLOSTAR) 100 UNIT/ML Solostar Pen 20 pen 2    Sig: Inject 154 Units into the skin every morning. And pen needles 1/day    Return in about 7 weeks (around 12/12/2018).  Karle Plumber, MD, FACP

## 2018-10-25 MED FILL — GABAPENTIN 300 MG CAPSULE: 300 | 30 days supply | Qty: 210 | Fill #1

## 2018-11-01 MED FILL — !LANTUS SOLOSTAR 100UNITS/M: 100 | 29 days supply | Qty: 45 | Fill #0

## 2018-11-04 ENCOUNTER — Ambulatory Visit (HOSPITAL_COMMUNITY)
Admission: RE | Admit: 2018-11-04 | Payer: Self-pay | Source: Ambulatory Visit | Attending: Cardiovascular Disease | Admitting: Cardiovascular Disease

## 2018-11-04 ENCOUNTER — Encounter (HOSPITAL_COMMUNITY): Payer: Self-pay

## 2018-11-28 ENCOUNTER — Inpatient Hospital Stay (HOSPITAL_COMMUNITY)
Admission: RE | Admit: 2018-11-28 | Payer: Self-pay | Source: Ambulatory Visit | Attending: Cardiovascular Disease | Admitting: Cardiovascular Disease

## 2018-12-02 ENCOUNTER — Ambulatory Visit (HOSPITAL_COMMUNITY)
Admission: RE | Admit: 2018-12-02 | Discharge: 2018-12-02 | Disposition: A | Discharge status: Home / Self Care | Payer: Self-pay | Source: Ambulatory Visit | Attending: Cardiology | Admitting: Cardiology

## 2018-12-02 DIAGNOSIS — I70229 Atherosclerosis of native arteries of extremities with rest pain, unspecified extremity: Secondary | ICD-10-CM

## 2018-12-02 DIAGNOSIS — I998 Other disorder of circulatory system: Secondary | ICD-10-CM | POA: Insufficient documentation

## 2018-12-04 ENCOUNTER — Other Ambulatory Visit: Payer: Self-pay | Admitting: Podiatry

## 2018-12-04 ENCOUNTER — Ambulatory Visit (INDEPENDENT_AMBULATORY_CARE_PROVIDER_SITE_OTHER): Payer: Self-pay

## 2018-12-04 ENCOUNTER — Encounter: Payer: Self-pay | Admitting: Podiatry

## 2018-12-04 ENCOUNTER — Ambulatory Visit: Payer: Self-pay | Attending: Family Medicine

## 2018-12-04 ENCOUNTER — Ambulatory Visit (INDEPENDENT_AMBULATORY_CARE_PROVIDER_SITE_OTHER): Payer: Self-pay | Admitting: Podiatry

## 2018-12-04 DIAGNOSIS — S90211A Contusion of right great toe with damage to nail, initial encounter: Secondary | ICD-10-CM

## 2018-12-04 DIAGNOSIS — L03031 Cellulitis of right toe: Secondary | ICD-10-CM

## 2018-12-04 DIAGNOSIS — E559 Vitamin D deficiency, unspecified: Secondary | ICD-10-CM | POA: Insufficient documentation

## 2018-12-04 DIAGNOSIS — L603 Nail dystrophy: Secondary | ICD-10-CM

## 2018-12-04 DIAGNOSIS — I10 Essential (primary) hypertension: Secondary | ICD-10-CM

## 2018-12-04 MED ORDER — DOXYCYCLINE HYCLATE 100 MG PO TABS: 100.0000 mg | ORAL_TABLET | Freq: Two times a day (BID) | ORAL | 0 refills | Status: DC

## 2018-12-04 MED FILL — DOXYCYCLINE HYCLATE 100 MG: 100 | 10 days supply | Qty: 20 | Fill #0

## 2018-12-05 LAB — BASIC METABOLIC PANEL
BUN/Creatinine Ratio: 14 (ref 12–28)
BUN: 13 mg/dL (ref 8–27)
CO2: 29 mmol/L (ref 20–29)
Calcium: 9.2 mg/dL (ref 8.7–10.3)
Chloride: 94 mmol/L — ABNORMAL LOW (ref 96–106)
Creatinine, Ser: 0.94 mg/dL (ref 0.57–1.00)
GFR calc Af Amer: 75 mL/min/{1.73_m2} (ref >59)
GFR, EST NON AFRICAN AMERICAN: 65 mL/min/{1.73_m2} (ref >59)
Glucose: 345 mg/dL — ABNORMAL HIGH (ref 65–99)
POTASSIUM: 4.4 mmol/L (ref 3.5–5.2)
Sodium: 138 mmol/L (ref 134–144)

## 2018-12-05 LAB — VITAMIN D 25 HYDROXY (VIT D DEFICIENCY, FRACTURES): Vit D, 25-Hydroxy: 40.6 ng/mL (ref 30.0–100.0)

## 2018-12-06 ENCOUNTER — Ambulatory Visit: Payer: Self-pay | Attending: Family Medicine

## 2018-12-06 MED FILL — HYDROCHLOROTHIAZIDE 25 MG T: 25 | 30 days supply | Qty: 30 | Fill #2

## 2018-12-08 NOTE — Progress Notes (Signed)
   Subjective: 63 year old female presenting today with a chief complaint of broken skin to the base of the right great toe that appeared 3-4 days ago. She reports associated discoloration, pain, swelling and redness. Touching the area increases the pain. She has not done anything for treatment. Patient is here for further evaluation and treatment.   Past Medical History:  Diagnosis Date  . Anemia    requiring transfusion  . Boil of buttock ~ 2016  . Depression   . Diabetic neuropathy ([place])    Archie Endo 08/23/2017  . DKA (diabetic ketoacidoses) ([place])    recent/notes 01/06/2015  . Hyperlipidemia   . Hypertension   . Neuropathy   . Peripheral vascular disease ([place])   . Toe ulcer ([place])    left great toe/notes 08/23/2017  . Type II diabetes mellitus ([place])     Objective:  General: Well developed, nourished, in no acute distress, alert and oriented x3   Dermatology: Hyperkeratotic, discolored, partially detached right great toenail. Erythema and edema localized to the right great toe. Skin is warm, dry and supple bilateral lower extremities.   Vascular: Dorsalis Pedis artery and Posterior Tibial artery pedal pulses palpable. No lower extremity edema noted.   Neruologic: Absent via light touch bilateral.  Musculoskeletal: Muscular strength within normal limits in all groups bilateral. Normal range of motion noted to all pedal and ankle joints.   Radiographic Exam:  Normal osseous mineralization. Joint spaces preserved. No fracture/dislocation/boney destruction.    Assessment:  #1 Dystrophic, partially detached nail of the right hallux #2 cellulitis right hallux   Plan of Care:  1. Patient evaluated. X-Rays reviewed.  2. Discussed treatment alternatives and plan of care. Explained nail avulsion procedure and post procedure course to patient. 3. Patient opted for total temporary nail avulsion of the right great toenail.  4. No lidocaine utilized secondary to neuropathy.  5. Light  dressing applied. 6. Recommended Betadine daily with a bandage.  7. Prescription Doxycycline #20 provided to patient.  8. Return to clinic in 2 weeks for nail trim.   Edrick Kins, DPM [facility]  Dr. Edrick Kins, [address]                                        [place], [place] [address]                Office [phone]  Fax [phone]

## 2018-12-10 ENCOUNTER — Telehealth: Payer: Self-pay

## 2018-12-10 DIAGNOSIS — I70229 Atherosclerosis of native arteries of extremities with rest pain, unspecified extremity: Secondary | ICD-10-CM

## 2018-12-10 DIAGNOSIS — I998 Other disorder of circulatory system: Secondary | ICD-10-CM

## 2018-12-10 NOTE — Telephone Encounter (Signed)
Pt aware of results 

## 2018-12-11 ENCOUNTER — Encounter (HOSPITAL_COMMUNITY): Payer: Self-pay | Admitting: *Deleted

## 2018-12-11 ENCOUNTER — Telehealth: Payer: Self-pay

## 2018-12-11 ENCOUNTER — Emergency Department (HOSPITAL_COMMUNITY)
Admission: EM | Admit: 2018-12-11 | Discharge: 2018-12-11 | Disposition: A | Discharge status: Home / Self Care | Payer: Self-pay | Attending: Emergency Medicine | Admitting: Emergency Medicine

## 2018-12-11 ENCOUNTER — Emergency Department (HOSPITAL_COMMUNITY): Payer: Self-pay

## 2018-12-11 DIAGNOSIS — R69 Illness, unspecified: Secondary | ICD-10-CM

## 2018-12-11 DIAGNOSIS — Z79899 Other long term (current) drug therapy: Secondary | ICD-10-CM | POA: Insufficient documentation

## 2018-12-11 DIAGNOSIS — Z794 Long term (current) use of insulin: Secondary | ICD-10-CM | POA: Insufficient documentation

## 2018-12-11 DIAGNOSIS — J111 Influenza due to unidentified influenza virus with other respiratory manifestations: Secondary | ICD-10-CM | POA: Insufficient documentation

## 2018-12-11 DIAGNOSIS — I10 Essential (primary) hypertension: Secondary | ICD-10-CM | POA: Insufficient documentation

## 2018-12-11 DIAGNOSIS — E119 Type 2 diabetes mellitus without complications: Secondary | ICD-10-CM | POA: Insufficient documentation

## 2018-12-11 DIAGNOSIS — Z7902 Long term (current) use of antithrombotics/antiplatelets: Secondary | ICD-10-CM | POA: Insufficient documentation

## 2018-12-11 LAB — CBC
HEMATOCRIT: 38.4 % (ref 36.0–46.0)
Hemoglobin: 12.2 g/dL (ref 12.0–15.0)
MCH: 27.5 pg (ref 26.0–34.0)
MCHC: 31.8 g/dL (ref 30.0–36.0)
MCV: 86.7 fL (ref 80.0–100.0)
Platelets: 249 10*3/uL (ref 150–400)
RBC: 4.43 MIL/uL (ref 3.87–5.11)
RDW: 13.2 % (ref 11.5–15.5)
WBC: 4.5 10*3/uL (ref 4.0–10.5)
nRBC: 0 % (ref 0.0–0.2)

## 2018-12-11 LAB — BASIC METABOLIC PANEL
Anion gap: 8 (ref 5–15)
BUN: 13 mg/dL (ref 8–23)
CO2: 26 mmol/L (ref 22–32)
Calcium: 8.9 mg/dL (ref 8.9–10.3)
Chloride: 103 mmol/L (ref 98–111)
Creatinine, Ser: 0.9 mg/dL (ref 0.44–1.00)
Glucose, Bld: 201 mg/dL — ABNORMAL HIGH (ref 70–99)
Potassium: 3.9 mmol/L (ref 3.5–5.1)
Sodium: 137 mmol/L (ref 135–145)

## 2018-12-11 LAB — URINALYSIS, ROUTINE W REFLEX MICROSCOPIC
BACTERIA UA: NONE SEEN
Bilirubin Urine: NEGATIVE
Glucose, UA: 50 mg/dL — AB
Ketones, ur: NEGATIVE mg/dL
Leukocytes, UA: NEGATIVE
Nitrite: NEGATIVE
Protein, ur: 100 mg/dL — AB
Specific Gravity, Urine: 1.024 (ref 1.005–1.030)
pH: 5 (ref 5.0–8.0)

## 2018-12-11 MED ORDER — FLUTICASONE PROPIONATE 50 MCG/ACT NA SUSP: 1.0000 | Freq: Every day | NASAL | 0 refills | Status: DC

## 2018-12-11 MED ORDER — ONDANSETRON 4 MG PO TBDP: 4.0000 mg | ORAL_TABLET | Freq: Three times a day (TID) | ORAL | 0 refills | Status: DC | PRN

## 2018-12-11 MED ORDER — BENZONATATE 100 MG PO CAPS: 100.0000 mg | ORAL_CAPSULE | Freq: Three times a day (TID) | ORAL | 0 refills | Status: DC | PRN

## 2018-12-11 MED ORDER — OSELTAMIVIR PHOSPHATE 75 MG PO CAPS: 75.0000 mg | ORAL_CAPSULE | Freq: Two times a day (BID) | ORAL | 0 refills | Status: DC

## 2018-12-11 NOTE — ED Triage Notes (Signed)
Pt in c/o continued cough and congestion, states she had a cold a few weeks ago and started to feel better, in the last week symptoms have worsened again, reports body aches and chills also fatigue, unsure of fever at home

## 2018-12-11 NOTE — ED Provider Notes (Signed)
[facility] EMERGENCY DEPARTMENT Provider Note   CSN: [id number] Arrival date & time: 12/11/18  1156     History   Chief Complaint Chief Complaint  Patient presents with  . URI    HPI Katherine [name] is a 63 y.o. female with a hx of anemia, depression, hyperlipidemia, HTN, T2DM and PVD who presents to the ED with waxing/waning URI sxs x 3-4 weeks. Patient states she had a cold a few weeks ago that she thought was clearing up then over the past 1 week seemed to come back worse with congestion, rhinorrhea, dry cough, &  Nausea with subsequent recent subjective fevers, chills, and body aches over past 48 hours. She states her chest hurts when she coughs and she feels short of breath when coughing- otherwise none of these sxs. She feels generally weak/fatigued. No specific alleviating/aggravating factors. Her husband is sick with similar sxs. Denies vomiting, abdominal pain or diarrhea.   HPI  Past Medical History:  Diagnosis Date  . Anemia    requiring transfusion  . Boil of buttock ~ 2016  . Depression   . Diabetic neuropathy ([place])    Archie Endo 08/23/2017  . DKA (diabetic ketoacidoses) ([place])    recent/notes 01/06/2015  . Hyperlipidemia   . Hypertension   . Neuropathy   . Peripheral vascular disease ([place])   . Toe ulcer ([place])    left great toe/notes 08/23/2017  . Type II diabetes mellitus [facility])     Patient Active Problem List   Diagnosis Date Noted  . Diabetic polyneuropathy associated with type 2 diabetes mellitus ([place]) 05/14/2018  . Thyroid nodule 10/04/2017  . Microcytic anemia 09/11/2017  . Critical lower limb ischemia 09/10/2017  . Retinopathy 08/22/2017  . Thyroid enlargement 07/12/2017  . Dyslipidemia 09/20/2016  . DEPRESSION 09/10/2009  . LEG CRAMPS 05/19/2009  . GERD 02/15/2009  . Gastroparesis 09/03/2008  . DENTAL CARIES 07/14/2008  . Diabetes mellitus type 2 with complications, uncontrolled ([place]) 09/16/2003  . Essential hypertension 09/16/2003      Past Surgical History:  Procedure Laterality Date  . AMPUTATION TOE Left 10/26/2017   Procedure: AMPUTATION PARTIAL RAY LEFT FOOT AND IRRIGATION/DEBRIDEMENT;  Surgeon: Edrick Kins, DPM;  Location: [facility];  Service: Podiatry;  Laterality: Left;  AMPUTATION PARTIAL RAY LEFT FOOT AND IRRIGATION/DEBRIDEMENT  . CHOLECYSTECTOMY  01/24/2003   Archie Endo 04/04/2011  . COLONOSCOPY    . INCISION AND DRAINAGE ABSCESS  03/17/2012   "right but; left lower abdoment"/notes 03/17/2012  . LOWER EXTREMITY INTERVENTION N/A 09/10/2017   Procedure: LOWER EXTREMITY INTERVENTION;  Surgeon: Lorretta Harp, MD;  Location: [facility] CV LAB;  Service: Cardiovascular;  Laterality: N/A;  . PERIPHERAL VASCULAR ATHERECTOMY  09/10/2017   Procedure: PERIPHERAL VASCULAR ATHERECTOMY;  Surgeon: Lorretta Harp, MD;  Location: [facility];  Service: Cardiovascular;;  left AT     OB History   No obstetric history on file.      Home Medications    Prior to Admission medications   Medication Sig Start Date End Date Taking? Authorizing Provider  amLODipine (NORVASC) 10 MG tablet Take 1 tablet (10 mg total) by mouth daily. 10/24/18   Ladell Pier, MD  atorvastatin (LIPITOR) 80 MG tablet Take 1 tablet (80 mg total) by mouth daily at 6 PM. 10/31/17   Lorretta Harp, MD  Blood Glucose Monitoring Suppl (TRUE METRIX METER) w/Device KIT Use as directed 06/07/17   Ladell Pier, MD  Blood Glucose Monitoring Suppl (TRUE METRIX METER)  w/Device KIT Use as directed 09/19/18   Ladell Pier, MD  carvedilol (COREG) 6.25 MG tablet Take 1 tablet (6.25 mg total) by mouth 2 (two) times daily with a meal. 08/28/18   Argentina Donovan, PA-C  clopidogrel (PLAVIX) 75 MG tablet Take 1 tablet (75 mg total) by mouth daily with breakfast. 01/10/18   Lorretta Harp, MD  doxycycline (VIBRA-TABS) 100 MG tablet Take 1 tablet (100 mg total) by mouth 2 (two) times daily. 12/04/18   Edrick Kins, DPM  fluticasone (FLONASE)  50 MCG/ACT nasal spray Place 2 sprays into both nostrils daily. 08/28/18   Argentina Donovan, PA-C  gabapentin (NEURONTIN) 300 MG capsule 2 caps PO Q a.m and 2 tabs PO Q afternoon and 3 tabs Q p.m 09/19/18   Ladell Pier, MD  glucose blood (TRUE METRIX BLOOD GLUCOSE TEST) test strip Use as instructed 06/07/17   Ladell Pier, MD  glucose blood (TRUE METRIX BLOOD GLUCOSE TEST) test strip Use as instructed 09/19/18   Ladell Pier, MD  hydrALAZINE (APRESOLINE) 100 MG tablet Take 1 tablet (100 mg total) by mouth 3 (three) times daily. 02/13/17   Langeland, Dawn T, MD  hydrochlorothiazide (HYDRODIURIL) 25 MG tablet TAKE 1 TABLET (25 MG TOTAL) BY MOUTH DAILY. 07/11/18   Ladell Pier, MD  Insulin Glargine (LANTUS SOLOSTAR) 100 UNIT/ML Solostar Pen Inject 154 Units into the skin every morning. And pen needles 1/day 10/24/18   Ladell Pier, MD  loratadine (CLARITIN) 10 MG tablet Take 1 tablet (10 mg total) by mouth daily. 12/17/17   Ladell Pier, MD  metFORMIN (GLUMETZA) 500 MG (MOD) 24 hr tablet 1 tab PO daily x 2 wks then 2 tabs PO daily 10/24/18   Ladell Pier, MD  Multiple Vitamins-Minerals (MULTIVITAMIN ADULT) CHEW Chew 1 tablet by mouth daily.    [provider]  ondansetron (ZOFRAN ODT) 4 MG disintegrating tablet '4mg'$  ODT q4 hours prn nausea/vomit 05/24/18   Jacqlyn Larsen, PA-C  spironolactone (ALDACTONE) 25 MG tablet Take 0.5 tablets (12.5 mg total) by mouth daily. 10/24/18   Ladell Pier, MD  triamcinolone cream (KENALOG) 0.1 % Apply 1 application topically 2 (two) times daily. 09/19/18   Ladell Pier, MD  TRUEPLUS LANCETS 28G MISC Use as directed 05/14/17   Tresa Garter, MD  TRUEPLUS LANCETS 28G MISC Use as directed 09/19/18   Ladell Pier, MD  TRUEPLUS PEN NEEDLES 31G X 5 MM MISC See admin instructions. 06/11/18   [provider]  Vitamin D, Ergocalciferol, (DRISDOL) 1.25 MG (50000 UT) CAPS capsule Take 1 capsule (50,000 Units  total) by mouth every 7 (seven) days. 10/24/18   Ladell Pier, MD    Family History Family History  Problem Relation Age of Onset  . Diabetes Father   . Hypertension Father   . Kidney disease Mother   . Hypertension Mother   . Cancer Maternal Grandmother   . Hypertension Maternal Grandfather   . Diabetes Paternal Aunt   . Diabetes Paternal Uncle   . Diabetes Paternal Grandfather   . Colon cancer Neg Hx   . Breast cancer Neg Hx     Social History Social History   Tobacco Use  . Smoking status: Never Smoker  . Smokeless tobacco: Never Used  Substance Use Topics  . Alcohol use: No  . Drug use: No     Allergies   Other and Metformin and related   Review of Systems Review of  Systems  Constitutional: Positive for chills and fever.  HENT: Positive for congestion and sore throat.   Respiratory: Positive for cough and shortness of breath (w/ coughing).   Cardiovascular: Positive for chest pain (w/ coughing).  Gastrointestinal: Positive for nausea. Negative for abdominal pain, constipation, diarrhea and vomiting.  Genitourinary: Negative for dysuria.  Neurological: Positive for weakness (generalized).  All other systems reviewed and are negative.    Physical Exam Updated Vital Signs BP (!) 163/58 (BP Location: Right Arm)   Pulse 70   Temp 98.5 F (36.9 C) (Oral)   Resp 16   SpO2 100%   Physical Exam Vitals signs and nursing note reviewed.  Constitutional:      General: She is not in acute distress.    Appearance: She is well-developed.  HENT:     Head: Normocephalic and atraumatic.     Right Ear: Tympanic membrane is not perforated, erythematous, retracted or bulging.     Left Ear: Tympanic membrane is not perforated, erythematous, retracted or bulging.     Nose: Congestion present.     Comments: No sinus tenderness.     Mouth/Throat:     Pharynx: Uvula midline. No oropharyngeal exudate or posterior oropharyngeal erythema.  Eyes:     General:         Right eye: No discharge.        Left eye: No discharge.     Conjunctiva/sclera: Conjunctivae normal.     Pupils: Pupils are equal, round, and reactive to light.  Neck:     Musculoskeletal: Normal range of motion and neck supple.  Cardiovascular:     Rate and Rhythm: Normal rate and regular rhythm.     Heart sounds: No murmur.  Pulmonary:     Effort: No respiratory distress.     Breath sounds: Normal breath sounds. No wheezing or rales.  Abdominal:     General: There is no distension.     Palpations: Abdomen is soft.     Tenderness: There is no abdominal tenderness. There is no guarding or rebound.  Lymphadenopathy:     Cervical: No cervical adenopathy.  Skin:    General: Skin is warm and dry.     Findings: No rash.  Neurological:     Mental Status: She is alert.  Psychiatric:        Behavior: Behavior normal.      ED Treatments / Results  Labs (all labs ordered are listed, but only abnormal results are displayed) Labs Reviewed  BASIC METABOLIC PANEL - Abnormal; Notable for the following components:      Result Value   Glucose, Bld 201 (*)    All other components within normal limits  URINALYSIS, ROUTINE W REFLEX MICROSCOPIC - Abnormal; Notable for the following components:   Glucose, UA 50 (*)    Hgb urine dipstick SMALL (*)    Protein, ur 100 (*)    All other components within normal limits  CBC  INFLUENZA PANEL BY PCR (TYPE A & B)    EKG EKG Interpretation  Date/Time:  Wednesday December 11 2018 14:23:35 EST Ventricular Rate:  64 PR Interval:  182 QRS Duration: 90 QT Interval:  432 QTC Calculation: 445 R Axis:   31 Text Interpretation:  Normal sinus rhythm Nonspecific ST and T wave abnormality Abnormal ECG similar to Dec 2018 Confirmed by Sherwood Gambler [phone]) on 12/11/2018 2:27:57 PM   Radiology Dg Chest 2 View  Result Date: 12/11/2018 CLINICAL DATA:  Cough EXAM: CHEST - 2 VIEW  COMPARISON:  07/17/2017 FINDINGS: The heart size and mediastinal contours  are within normal limits. Both lungs are clear. The visualized skeletal structures are unremarkable. IMPRESSION: No active cardiopulmonary disease. Electronically Signed   By: Franchot Gallo M.D.   On: 12/11/2018 13:06    Procedures Procedures (including critical care time)  Medications Ordered in ED Medications - No data to display   Initial Impression / Assessment and Plan / ED Course  I have reviewed the triage vital signs and the nursing notes.  Pertinent labs & imaging results that were available during my care of the patient were reviewed by me and considered in my medical decision making (see chart for details).   Patient presents to the emergency department with URI symptoms that have been waxing/waning for the past 3 to 4 weeks and acutely worsened over the past 1 week with subsequent development of subjective fever, generalized body aches, generalized weakness, as well as nausea without vomiting. Flu like sxs seem to have been over the past 48 hours. On exam patient is nontoxic-appearing, in no apparent distress, vitals WNL with the exception of a very elevated blood pressure, doubt HTN emergency, she is aware of need for recheck.  She has some mild nasal congestion noted.  Her lungs are clear.  Heart regular rate and rhythm.  Abdomen is nontender.  Will further evaluate with EKG, chest x-ray, basic labs, and flu swab.  Results reviewed: Chest x-ray: No acute cardiopulmonary abnormality.  Negative for infiltrate, effusion, edema, or pneumothorax. CBC: No leukocytosis.  No anemia. BMP: Fairly unremarkable.  Hyperglycemic to 201, no ketonuria, elevated gap, or acidosis to indicate DKA.  Electrolytes are within normal limits.  Renal function is preserved. Urinalysis: Small amount of hemoglobin, glucose, and protein, no UTI. EKG: Normal sinus rhythm, non specific changes, no STEMI.  Patient's chest pain is with coughing, otherwise none, low suspicion for ACS or other emergent  cardiopulmonary abnormality.  Influenza swab: Patient refused this.  Overall reassuring work-up in the emergency department.  Lungs clear to auscultation, chest x-ray negative for infiltrate, doubt pneumonia.  No sinus tenderness, low suspicion for acute bacterial sinusitis.  Centor score 0.  No meningismus.  Abdomen soft and nontender without peritoneal signs.  Unclear definitive etiology, seems viral, somewhat influenza-like in nature. given patient has multiple medical comorbidities we discussed option of tamiflu, risks/benefits as well as side effects discussed, she would prefer to receive this medicine therefore it was prescribed.  Additional supportive treatments including Flonase, Tessalon, and zofran prescribed. I discussed results, treatment plan, need for follow-up, and return precautions with the patient. Provided opportunity for questions, patient confirmed understanding and is in agreement with plan.   Findings and plan of care discussed with supervising physician Dr. Regenia Skeeter who is in agreement.   Final Clinical Impressions(s) / ED Diagnoses   Final diagnoses:  Influenza-like illness    ED Discharge Orders         Ordered    oseltamivir (TAMIFLU) 75 MG capsule  Every 12 hours     12/11/18 1511    fluticasone (FLONASE) 50 MCG/ACT nasal spray  Daily     12/11/18 1511    benzonatate (TESSALON) 100 MG capsule  3 times daily PRN     12/11/18 1511    ondansetron (ZOFRAN ODT) 4 MG disintegrating tablet  Every 8 hours PRN     12/11/18 1511           Dameka Younker, Glynda Jaeger, PA-C 12/11/18 1519    Sherwood Gambler,  MD 12/11/18 1907

## 2018-12-11 NOTE — Telephone Encounter (Signed)
Contacted pt to go over lab results pt didn't answer left a detailed vm informing pt of results and if she has any questions or concerns to give me a call  

## 2018-12-11 NOTE — Discharge Instructions (Addendum)
You were seen in the ER for flu like symptoms.  Your labs were all fairly normal other than high blood sugar at 201- be sure to monitor this closely. Your urine had a bit of blood/protein please have this rechecked by primary care.   We suspect you  have a flu like illness and are treating this with tamiflu, flonase (for congestion daily in each nare), tessalon (for cough every 8 hours), and zofran (for nausea/vomiting every 8 hours).   We have prescribed you new medication(s) today. Discuss the medications prescribed today with your pharmacist as they can have adverse effects and interactions with your other medicines including over the counter and prescribed medications. Seek medical evaluation if you start to experience new or abnormal symptoms after taking one of these medicines, seek care immediately if you start to experience difficulty breathing, feeling of your throat closing, facial swelling, or rash as these could be indications of a more serious allergic reaction  Please follow up with primary care within 3-5 days. Return to the Er for new or worsening symptoms or any other concerns.

## 2018-12-11 NOTE — ED Notes (Signed)
Pt refused flu swab, PA Petrucelli aware

## 2018-12-12 MED FILL — OSELTAMIVIR PHOSPHATE 75 MG: 75 | 5 days supply | Qty: 10 | Fill #0

## 2018-12-12 MED FILL — ONDANSETRON ODT 4 MG TABLET: 4 | 3 days supply | Qty: 5 | Fill #0

## 2018-12-12 MED FILL — BENZONATATE 100 MG CAP: 100 | 7 days supply | Qty: 21 | Fill #0

## 2018-12-17 ENCOUNTER — Telehealth: Payer: Self-pay | Admitting: Internal Medicine

## 2018-12-17 NOTE — Telephone Encounter (Signed)
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

## 2018-12-18 ENCOUNTER — Ambulatory Visit (INDEPENDENT_AMBULATORY_CARE_PROVIDER_SITE_OTHER): Payer: No Typology Code available for payment source | Admitting: Podiatry

## 2018-12-18 DIAGNOSIS — M79676 Pain in unspecified toe(s): Secondary | ICD-10-CM

## 2018-12-18 DIAGNOSIS — B351 Tinea unguium: Secondary | ICD-10-CM

## 2018-12-18 DIAGNOSIS — E0842 Diabetes mellitus due to underlying condition with diabetic polyneuropathy: Secondary | ICD-10-CM

## 2018-12-19 ENCOUNTER — Telehealth: Payer: Self-pay | Admitting: Internal Medicine

## 2018-12-19 NOTE — Telephone Encounter (Signed)
Pt called to request the application papers since she is going to apply thru the the Hospital, so I can not process the application any more

## 2018-12-25 NOTE — Progress Notes (Signed)
   SUBJECTIVE Patient presents to office today for follow up evaluation of a total temporary nail avulsion procedure of the right great toe done on 12/04/2018. She states she is doing well and has completed the course of Doxycycline. She is also complaining of elongated, thickened nails that cause pain while ambulating in shoes. She is unable to trim her own nails. Patient is here for further evaluation and treatment.  Past Medical History:  Diagnosis Date  . Anemia    requiring transfusion  . Boil of buttock ~ 2016  . Depression   . Diabetic neuropathy ([place])    Archie Endo 08/23/2017  . DKA (diabetic ketoacidoses) ([place])    recent/notes 01/06/2015  . Hyperlipidemia   . Hypertension   . Neuropathy   . Peripheral vascular disease ([place])   . Toe ulcer ([place])    left great toe/notes 08/23/2017  . Type II diabetes mellitus ([place])     OBJECTIVE General Patient is awake, alert, and oriented x 3 and in no acute distress. Derm Skin is dry and supple bilateral. Negative open lesions or macerations. Remaining integument unremarkable. Nails are tender, long, thickened and dystrophic with subungual debris, consistent with onychomycosis, 2-5 bilateral. No signs of infection noted. Vasc  DP and PT pedal pulses palpable bilaterally. Temperature gradient within normal limits.  Neuro Epicritic and protective threshold sensation grossly intact bilaterally.  Musculoskeletal Exam No symptomatic pedal deformities noted right; absence of left great toe noted. Muscular strength within normal limits.  ASSESSMENT 1. Onychodystrophic nails 2-5 bilateral with hyperkeratosis of nails.  2. Onychomycosis of nail due to dermatophyte bilateral 3. H/o left hallux amputation   PLAN OF CARE 1. Patient evaluated today.  2. Instructed to maintain good pedal hygiene and foot care.  3. Mechanical debridement of nails 2-5 bilaterally performed using a nail nipper. Filed with dremel without incident.  4. Return to clinic as  needed.    Edrick Kins, DPM [facility]  Dr. Edrick Kins, [address]                                        [place], [place] [address]                Office [phone]  Fax [phone]

## 2018-12-31 ENCOUNTER — Encounter: Payer: Self-pay | Admitting: Internal Medicine

## 2018-12-31 ENCOUNTER — Ambulatory Visit: Payer: Self-pay | Attending: Internal Medicine | Admitting: Internal Medicine

## 2018-12-31 VITALS — BP 186/80 | HR 64 | Temp 98.2°F | Resp 16 | Ht 72.0 in | Wt 235.8 lb

## 2018-12-31 DIAGNOSIS — Z8249 Family history of ischemic heart disease and other diseases of the circulatory system: Secondary | ICD-10-CM | POA: Insufficient documentation

## 2018-12-31 DIAGNOSIS — Z79899 Other long term (current) drug therapy: Secondary | ICD-10-CM | POA: Insufficient documentation

## 2018-12-31 DIAGNOSIS — I10 Essential (primary) hypertension: Secondary | ICD-10-CM | POA: Insufficient documentation

## 2018-12-31 DIAGNOSIS — Z9049 Acquired absence of other specified parts of digestive tract: Secondary | ICD-10-CM | POA: Insufficient documentation

## 2018-12-31 DIAGNOSIS — IMO0002 Reserved for concepts with insufficient information to code with codable children: Secondary | ICD-10-CM

## 2018-12-31 DIAGNOSIS — Z882 Allergy status to sulfonamides status: Secondary | ICD-10-CM | POA: Insufficient documentation

## 2018-12-31 DIAGNOSIS — Z89422 Acquired absence of other left toe(s): Secondary | ICD-10-CM | POA: Insufficient documentation

## 2018-12-31 DIAGNOSIS — E785 Hyperlipidemia, unspecified: Secondary | ICD-10-CM | POA: Insufficient documentation

## 2018-12-31 DIAGNOSIS — Z794 Long term (current) use of insulin: Secondary | ICD-10-CM | POA: Insufficient documentation

## 2018-12-31 DIAGNOSIS — E1142 Type 2 diabetes mellitus with diabetic polyneuropathy: Secondary | ICD-10-CM | POA: Insufficient documentation

## 2018-12-31 DIAGNOSIS — Z833 Family history of diabetes mellitus: Secondary | ICD-10-CM | POA: Insufficient documentation

## 2018-12-31 DIAGNOSIS — Z7902 Long term (current) use of antithrombotics/antiplatelets: Secondary | ICD-10-CM | POA: Insufficient documentation

## 2018-12-31 DIAGNOSIS — E1165 Type 2 diabetes mellitus with hyperglycemia: Secondary | ICD-10-CM | POA: Insufficient documentation

## 2018-12-31 DIAGNOSIS — Z91048 Other nonmedicinal substance allergy status: Secondary | ICD-10-CM | POA: Insufficient documentation

## 2018-12-31 DIAGNOSIS — Z809 Family history of malignant neoplasm, unspecified: Secondary | ICD-10-CM | POA: Insufficient documentation

## 2018-12-31 LAB — GLUCOSE, POCT (MANUAL RESULT ENTRY): POC Glucose: 139 mg/dl — AB (ref 70–99)

## 2018-12-31 MED ORDER — GABAPENTIN 300 MG PO CAPS: 900.0000 mg | ORAL_CAPSULE | Freq: Two times a day (BID) | ORAL | 5 refills | Status: DC

## 2018-12-31 MED ORDER — SPIRONOLACTONE 25 MG PO TABS: 25.0000 mg | ORAL_TABLET | Freq: Every day | ORAL | 4 refills | Status: DC

## 2018-12-31 NOTE — Patient Instructions (Signed)
Change gabapentin to 300 mg and take 3 tablets twice a day.  Increase Spironolactone to 25 mg once a day.  Continue to monitor blood pressure.

## 2018-12-31 NOTE — Progress Notes (Signed)
Pt states she is having pain in b/l legs

## 2018-12-31 NOTE — Progress Notes (Addendum)
Patient ID: BERRY GALLACHER, female    DOB: 15-Mar-1956  MRN: [id number]  CC: Diabetes and Hypertension   Subjective: Jenise Iannelli is a 63 y.o. female who presents for chronic ds management. Her concerns today include:  Hx of HTN, DM with neuropathy, retinopathyand possible gastroparesis, HL, depression, GERD, PAD, s/p amputation LT 1st toe.  HTN:  Spironolactone added on last visit.  BMP done 1 mth later revealed nl K+ -checks BP 2 x a wk.  Gives range 160s/70-80s Limits salt Compliant with all meds.  Tried taking a whole tab of Spironolactone one day and it caused dizziness for about 5 minutes She is worried and depressed that her BP is not going down.    DM: "I don't trust my machine." -feels nauseated when she first wakes up in mornings. BS under 200s.  BS last evening was 133, this a.m was 113.  BS have stayed below 150 Tolerating Metformin and Lantus 160 units in a.m "Feet and legs are really giving me a lot of problems." A lot of numbness during the day; cold at nights. She takes Gabapentin 300 mg 3 cap BID; this works better for her than as prescribed as 2 in a.m, 2 noon and 3 att nights.    Patient Active Problem List   Diagnosis Date Noted  . Diabetic polyneuropathy associated with type 2 diabetes mellitus ([facility]) 05/14/2018  . Thyroid nodule 10/04/2017  . Microcytic anemia 09/11/2017  . Critical lower limb ischemia 09/10/2017  . Retinopathy 08/22/2017  . Thyroid enlargement 07/12/2017  . Dyslipidemia 09/20/2016  . DEPRESSION 09/10/2009  . LEG CRAMPS 05/19/2009  . GERD 02/15/2009  . Gastroparesis 09/03/2008  . DENTAL CARIES 07/14/2008  . Diabetes mellitus type 2 with complications, uncontrolled ([place]) 09/16/2003  . Essential hypertension 09/16/2003     Current Outpatient Medications on File Prior to Visit  Medication Sig Dispense Refill  . amLODipine (NORVASC) 10 MG tablet Take 1 tablet (10 mg total) by mouth daily. 90 tablet 3  . atorvastatin (LIPITOR) 80 MG  tablet Take 1 tablet (80 mg total) by mouth daily at 6 PM. 30 tablet 6  . benzonatate (TESSALON) 100 MG capsule Take 1 capsule (100 mg total) by mouth 3 (three) times daily as needed for cough. 21 capsule 0  . Blood Glucose Monitoring Suppl (TRUE METRIX METER) w/Device KIT Use as directed 1 kit 0  . Blood Glucose Monitoring Suppl (TRUE METRIX METER) w/Device KIT Use as directed 1 kit 0  . carvedilol (COREG) 6.25 MG tablet Take 1 tablet (6.25 mg total) by mouth 2 (two) times daily with a meal. 60 tablet 3  . clopidogrel (PLAVIX) 75 MG tablet Take 1 tablet (75 mg total) by mouth daily with breakfast. 90 tablet 1  . fluticasone (FLONASE) 50 MCG/ACT nasal spray Place 1 spray into both nostrils daily. 16 g 0  . glucose blood (TRUE METRIX BLOOD GLUCOSE TEST) test strip Use as instructed 100 each 12  . glucose blood (TRUE METRIX BLOOD GLUCOSE TEST) test strip Use as instructed 100 each 12  . hydrALAZINE (APRESOLINE) 100 MG tablet Take 1 tablet (100 mg total) by mouth 3 (three) times daily. 90 tablet 2  . hydrochlorothiazide (HYDRODIURIL) 25 MG tablet TAKE 1 TABLET (25 MG TOTAL) BY MOUTH DAILY. 30 tablet 2  . Insulin Glargine (LANTUS SOLOSTAR) 100 UNIT/ML Solostar Pen Inject 154 Units into the skin every morning. And pen needles 1/day 20 pen 2  . loratadine (CLARITIN) 10 MG tablet Take 1 tablet (  10 mg total) by mouth daily. 30 tablet 11  . metFORMIN (GLUMETZA) 500 MG (MOD) 24 hr tablet 1 tab PO daily x 2 wks then 2 tabs PO daily 60 tablet 1  . Multiple Vitamins-Minerals (MULTIVITAMIN ADULT) CHEW Chew 1 tablet by mouth daily.    . ondansetron (ZOFRAN ODT) 4 MG disintegrating tablet Take 1 tablet (4 mg total) by mouth every 8 (eight) hours as needed for nausea or vomiting. 5 tablet 0  . oseltamivir (TAMIFLU) 75 MG capsule Take 1 capsule (75 mg total) by mouth every 12 (twelve) hours. 10 capsule 0  . triamcinolone cream (KENALOG) 0.1 % Apply 1 application topically 2 (two) times daily. 30 g 0  . TRUEPLUS  LANCETS 28G MISC Use as directed 100 each 12  . TRUEPLUS LANCETS 28G MISC Use as directed 100 each 6  . TRUEPLUS PEN NEEDLES 31G X 5 MM MISC See admin instructions.  11  . Vitamin D, Ergocalciferol, (DRISDOL) 1.25 MG (50000 UT) CAPS capsule Take 1 capsule (50,000 Units total) by mouth every 7 (seven) days. 16 capsule 0   No current facility-administered medications on file prior to visit.     Allergies  Allergen Reactions  . Other Swelling    Seaweed= swelling on arms, hands and face  . Metformin And Related Nausea Only    Social History   Socioeconomic History  . Marital status: Married    Spouse name: Not on file  . Number of children: Not on file  . Years of education: Not on file  . Highest education level: Not on file  Occupational History  . Not on file  Social Needs  . Financial resource strain: Not on file  . Food insecurity:    Worry: Not on file    Inability: Not on file  . Transportation needs:    Medical: Not on file    Non-medical: Not on file  Tobacco Use  . Smoking status: Never Smoker  . Smokeless tobacco: Never Used  Substance and Sexual Activity  . Alcohol use: No  . Drug use: No  . Sexual activity: Never  Lifestyle  . Physical activity:    Days per week: Not on file    Minutes per session: Not on file  . Stress: Not on file  Relationships  . Social connections:    Talks on phone: Not on file    Gets together: Not on file    Attends religious service: Not on file    Active member of club or organization: Not on file    Attends meetings of clubs or organizations: Not on file    Relationship status: Not on file  . Intimate partner violence:    Fear of current or ex partner: Not on file    Emotionally abused: Not on file    Physically abused: Not on file    Forced sexual activity: Not on file  Other Topics Concern  . Not on file  Social History Narrative  . Not on file    Family History  Problem Relation Age of Onset  . Diabetes Father    . Hypertension Father   . Kidney disease Mother   . Hypertension Mother   . Cancer Maternal Grandmother   . Hypertension Maternal Grandfather   . Diabetes Paternal Aunt   . Diabetes Paternal Uncle   . Diabetes Paternal Grandfather   . Colon cancer Neg Hx   . Breast cancer Neg Hx     Past Surgical History:  Procedure Laterality Date  . AMPUTATION TOE Left 10/26/2017   Procedure: AMPUTATION PARTIAL RAY LEFT FOOT AND IRRIGATION/DEBRIDEMENT;  Surgeon: Edrick Kins, DPM;  Location: [place];  Service: Podiatry;  Laterality: Left;  AMPUTATION PARTIAL RAY LEFT FOOT AND IRRIGATION/DEBRIDEMENT  . CHOLECYSTECTOMY  01/24/2003   Archie Endo 04/04/2011  . COLONOSCOPY    . INCISION AND DRAINAGE ABSCESS  03/17/2012   "right but; left lower abdoment"/notes 03/17/2012  . LOWER EXTREMITY INTERVENTION N/A 09/10/2017   Procedure: LOWER EXTREMITY INTERVENTION;  Surgeon: Lorretta Harp, MD;  Location: [place] [facility];  Service: Cardiovascular;  Laterality: N/A;  . PERIPHERAL VASCULAR ATHERECTOMY  09/10/2017   Procedure: PERIPHERAL VASCULAR ATHERECTOMY;  Surgeon: Lorretta Harp, MD;  Location: [place] [facility];  Service: Cardiovascular;;  left AT    ROS: Review of Systems Negative except as stated above  PHYSICAL EXAM: BP (!) 186/80   Pulse 64   Temp 98.2 F (36.8 C) (Oral)   Resp 16   Ht 6' (1.829 m)   Wt 235 lb 12.8 oz (107 kg)   SpO2 97%   BMI 31.98 kg/m   Wt Readings from Last 3 Encounters:  12/31/18 235 lb 12.8 oz (107 kg)  10/24/18 239 lb 9.6 oz (108.7 kg)  09/19/18 233 lb 12.8 oz (106.1 kg)    Physical Exam  General appearance - alert, well appearing, and in no distress Mental status - normal mood, behavior, speech, dress, motor activity, and thought processes Neck - supple, no significant adenopathy Chest - clear to auscultation, no wheezes, rales or rhonchi, symmetric air entry Heart - normal rate, regular rhythm, normal S1, S2, no murmurs, rubs, clicks or  gallops Extremities -no lower extremity edema Diabetic Foot Exam - Simple   Simple Foot Form Visual Inspection See comments:  Yes Sensation Testing See comments:  Yes Pulse Check See comments:  Yes Comments Patient has healed amputation of left big toe.  She has decreased sensation on the plantar surface of both feet.  Dorsalis pedis pulses 3+ on the right and 2+ on the left.  She has dry peeling of skin on the right big toe     CMP Latest Ref Rng & Units 12/11/2018 12/04/2018 07/19/2018  Glucose 70 - 99 mg/dL 201(H) 345(H) -  BUN 8 - 23 mg/dL 13 13 -  Creatinine 0.44 - 1.00 mg/dL 0.90 0.94 -  Sodium 135 - 145 mmol/L 137 138 -  Potassium 3.5 - 5.1 mmol/L 3.9 4.4 -  Chloride 98 - 111 mmol/L 103 94(L) -  CO2 22 - 32 mmol/L 26 29 -  Calcium 8.9 - 10.3 mg/dL 8.9 9.2 -  Total Protein 6.0 - 8.5 g/dL - - 7.2  Total Bilirubin 0.0 - 1.2 mg/dL - - 0.6  Alkaline Phos 39 - 117 IU/L - - 147(H)  AST 0 - 40 IU/L - - 8  ALT 0 - 32 IU/L - - 10   Lipid Panel     Component Value Date/Time   CHOL 130 07/19/2018 1009   TRIG 116 07/19/2018 1009   HDL 52 07/19/2018 1009   CHOLHDL 2.5 07/19/2018 1009   CHOLHDL 3.8 09/19/2016 1452   VLDL 36 (H) 09/19/2016 1452   LDLCALC 55 07/19/2018 1009    CBC    Component Value Date/Time   WBC 4.5 12/11/2018 1352   RBC 4.43 12/11/2018 1352   HGB 12.2 12/11/2018 1352   HGB 12.4 08/28/2018 1424   HCT 38.4 12/11/2018 1352   HCT 36.9 08/28/2018  1424   PLT 249 12/11/2018 1352   PLT 210 08/28/2018 1424   MCV 86.7 12/11/2018 1352   MCV 86 08/28/2018 1424   MCH 27.5 12/11/2018 1352   MCHC 31.8 12/11/2018 1352   RDW 13.2 12/11/2018 1352   RDW 12.9 08/28/2018 1424   LYMPHSABS 1.9 08/28/2018 1424   MONOABS 0.4 05/24/2018 1239   EOSABS 0.2 08/28/2018 1424   BASOSABS 0.0 08/28/2018 1424    ASSESSMENT AND PLAN: 1. Uncontrolled type 2 diabetes mellitus with peripheral neuropathy ([place]) Blood sugars by her report are much better.  Plan to check A1c today.   Encouraged her to continue healthy eating habits and current dose of metformin and Lantus. Okay to change gabapentin to 900 mg twice a day. - POCT glucose (manual entry) - Hemoglobin A1c - gabapentin (NEURONTIN) 300 MG capsule; Take 3 capsules (900 mg total) by mouth 2 (two) times daily.  Dispense: 180 capsule; Refill: 5  2. Hypertension, unspecified type Persistent elevation despite being on several blood pressure medications.  We will start investigating for secondary causes.  Will start with renal artery Doppler Increase Spironolactone to 25 mg daily.  If she is unable to tolerate taking the full tablet at one time, patient told to split the tablet in half and take half in the morning and a half in the afternoon. - VAS US RENAL ARTERY DUPLEX; Future - spironolactone (ALDACTONE) 25 MG tablet; Take 1 tablet (25 mg total) by mouth daily.  Dispense: 30 tablet; Refill: 4   Patient was given the opportunity to ask questions.  Patient verbalized understanding of the plan and was able to repeat key elements of the plan.   ADDENDUM:  A1C is 11.  Will have her increase Metformin to 1 gram BID and get her back to Dr. Loanne Drilling.  Orders Placed This Encounter  Procedures  . Hemoglobin A1c  . POCT glucose (manual entry)     Requested Prescriptions   Signed Prescriptions Disp Refills  . spironolactone (ALDACTONE) 25 MG tablet 30 tablet 4    Sig: Take 1 tablet (25 mg total) by mouth daily.  Marland Kitchen gabapentin (NEURONTIN) 300 MG capsule 180 capsule 5    Sig: Take 3 capsules (900 mg total) by mouth 2 (two) times daily.    Return in about 5 weeks (around 02/04/2019).  Karle Plumber, MD, FACP

## 2019-01-01 LAB — HEMOGLOBIN A1C
Est. average glucose Bld gHb Est-mCnc: 269 mg/dL
Hgb A1c MFr Bld: 11 % — ABNORMAL HIGH (ref 4.8–5.6)

## 2019-01-01 MED ORDER — METFORMIN HCL ER (MOD) 500 MG PO TB24: 1000.0000 mg | ORAL_TABLET | Freq: Two times a day (BID) | ORAL | 4 refills | Status: DC

## 2019-01-01 MED FILL — GABAPENTIN 300 MG CAPSULE: 300 | 30 days supply | Qty: 180 | Fill #0

## 2019-01-01 MED FILL — SPIRONOLACTONE 25 MG TABLET: 25 | 30 days supply | Qty: 30 | Fill #0

## 2019-01-01 NOTE — Addendum Note (Signed)
Addended by: Karle Plumber B on: 01/01/2019 10:03 AM   Modules accepted: Orders

## 2019-01-02 ENCOUNTER — Telehealth: Payer: Self-pay

## 2019-01-02 ENCOUNTER — Ambulatory Visit (HOSPITAL_COMMUNITY)
Admission: RE | Admit: 2019-01-02 | Discharge: 2019-01-02 | Disposition: A | Discharge status: Home / Self Care | Payer: Self-pay | Source: Ambulatory Visit | Attending: Internal Medicine | Admitting: Internal Medicine

## 2019-01-02 DIAGNOSIS — I10 Essential (primary) hypertension: Secondary | ICD-10-CM | POA: Insufficient documentation

## 2019-01-02 MED FILL — VIT D2 1.25 MG (50,000 UNIT: 1.25 MG | 28 days supply | Qty: 4 | Fill #1

## 2019-01-02 MED FILL — ?CARVEDILOL 6.25 MG TABLET: 6.25 | 30 days supply | Qty: 60 | Fill #1

## 2019-01-02 NOTE — Telephone Encounter (Signed)
Contacted pt to go over lab results pt didn't answer pt hone just kept ringing

## 2019-01-02 NOTE — Progress Notes (Signed)
Renal artery duplex has been completed. Preliminary results can be found in CV Proc through chart review.   01/02/19 9:49 AM Katherine [name] RVT

## 2019-01-08 ENCOUNTER — Telehealth: Payer: Self-pay

## 2019-01-08 NOTE — Telephone Encounter (Signed)
Contacted pt to go over Vas [place] results pt is aware and doesn't have any questions or concerns

## 2019-01-09 ENCOUNTER — Ambulatory Visit (INDEPENDENT_AMBULATORY_CARE_PROVIDER_SITE_OTHER): Payer: Self-pay | Admitting: Endocrinology

## 2019-01-09 ENCOUNTER — Encounter: Payer: Self-pay | Admitting: Endocrinology

## 2019-01-09 ENCOUNTER — Other Ambulatory Visit: Payer: Self-pay

## 2019-01-09 DIAGNOSIS — IMO0002 Reserved for concepts with insufficient information to code with codable children: Secondary | ICD-10-CM

## 2019-01-09 DIAGNOSIS — E1165 Type 2 diabetes mellitus with hyperglycemia: Secondary | ICD-10-CM

## 2019-01-09 DIAGNOSIS — E1142 Type 2 diabetes mellitus with diabetic polyneuropathy: Secondary | ICD-10-CM

## 2019-01-09 MED ORDER — INSULIN GLARGINE 100 UNIT/ML SOLOSTAR PEN: 180.0000 [IU] | PEN_INJECTOR | SUBCUTANEOUS | 2 refills | Status: DC

## 2019-01-09 NOTE — Patient Instructions (Addendum)
Your blood pressure is high today.  Please see your primary care provider soon, to have it rechecked Please continue the same lantus.  On Sundays, take just 130 units.  To avoid the blood sugar going low in the early morning, eat a light snack at bedtime.   check your blood sugar twice a day.  vary the time of day when you check, between before the 3 meals, and at bedtime.  also check if you have symptoms of your blood sugar being too high or too low.  please keep a record of the readings and bring it to your next appointment here (or you can bring the meter itself).  You can write it on any piece of paper.  please call us sooner if your blood sugar goes below 70, or if you have a lot of readings over 200.   Please come back for a follow-up appointment in 2 months.

## 2019-01-09 NOTE — Progress Notes (Signed)
Subjective:    Patient ID: Katherine [name], female    DOB: 1956-06-17, 63 y.o.   MRN: [id number]  HPI Pt returns for f/u of diabetes mellitus:  DM type: 1 Dx'ed: 4098 Complications: polyneuropathy, gastroparesis, foot ulcer, renal insuff, PAD, and DR.  Therapy: insulin since 2014 GDM: never DKA: once (2016) Severe hypoglycemia: never.   Pancreatitis: never.   Pancreatic imaging: normal on 2016 CT.   Other: she gets insulin through [facility]; she declines multiple daily injections; she did not tolerate levemir (rash); she was changed to NPH, then to 70/30, due to the pattern of her cbg's.   Interval history: no cbg record, but states cbg's vary from 76-235.  It is in general higher as the day goes on.  She has hypoglycemia approx 3/week, and these episodes are mild.  Pt says she misses the insulin every Sunday, because she does not eat breakfast that day.    Past Medical History:  Diagnosis Date  . Anemia    requiring transfusion  . Boil of buttock ~ 2016  . Depression   . Diabetic neuropathy ([place])    Archie Endo 08/23/2017  . DKA (diabetic ketoacidoses) ([place])    recent/notes 01/06/2015  . Hyperlipidemia   . Hypertension   . Neuropathy   . Peripheral vascular disease ([place])   . Toe ulcer ([place])    left great toe/notes 08/23/2017  . Type II diabetes mellitus ([place])     Past Surgical History:  Procedure Laterality Date  . AMPUTATION TOE Left 10/26/2017   Procedure: AMPUTATION PARTIAL RAY LEFT FOOT AND IRRIGATION/DEBRIDEMENT;  Surgeon: Edrick Kins, DPM;  Location: [facility];  Service: Podiatry;  Laterality: Left;  AMPUTATION PARTIAL RAY LEFT FOOT AND IRRIGATION/DEBRIDEMENT  . CHOLECYSTECTOMY  01/24/2003   Archie Endo 04/04/2011  . COLONOSCOPY    . INCISION AND DRAINAGE ABSCESS  03/17/2012   "right but; left lower abdoment"/notes 03/17/2012  . LOWER EXTREMITY INTERVENTION N/A 09/10/2017   Procedure: LOWER EXTREMITY INTERVENTION;  Surgeon: Lorretta Harp, MD;  Location: [facility] [facility];   Service: Cardiovascular;  Laterality: N/A;  . PERIPHERAL VASCULAR ATHERECTOMY  09/10/2017   Procedure: PERIPHERAL VASCULAR ATHERECTOMY;  Surgeon: Lorretta Harp, MD;  Location: [place] [facility];  Service: Cardiovascular;;  left AT    Social History   Socioeconomic History  . Marital status: Married    Spouse name: Not on file  . Number of children: Not on file  . Years of education: Not on file  . Highest education level: Not on file  Occupational History  . Not on file  Social Needs  . Financial resource strain: Not on file  . Food insecurity:    Worry: Not on file    Inability: Not on file  . Transportation needs:    Medical: Not on file    Non-medical: Not on file  Tobacco Use  . Smoking status: Never Smoker  . Smokeless tobacco: Never Used  Substance and Sexual Activity  . Alcohol use: No  . Drug use: No  . Sexual activity: Never  Lifestyle  . Physical activity:    Days per week: Not on file    Minutes per session: Not on file  . Stress: Not on file  Relationships  . Social connections:    Talks on phone: Not on file    Gets together: Not on file    Attends religious service: Not on file    Active member of club or organization: Not on file  Attends meetings of clubs or organizations: Not on file    Relationship status: Not on file  . Intimate partner violence:    Fear of current or ex partner: Not on file    Emotionally abused: Not on file    Physically abused: Not on file    Forced sexual activity: Not on file  Other Topics Concern  . Not on file  Social History Narrative  . Not on file    Current Outpatient Medications on File Prior to Visit  Medication Sig Dispense Refill  . amLODipine (NORVASC) 10 MG tablet Take 1 tablet (10 mg total) by mouth daily. 90 tablet 3  . atorvastatin (LIPITOR) 80 MG tablet Take 1 tablet (80 mg total) by mouth daily at 6 PM. 30 tablet 6  . carvedilol (COREG) 6.25 MG tablet Take 1 tablet (6.25 mg total) by mouth 2  (two) times daily with a meal. 60 tablet 3  . clopidogrel (PLAVIX) 75 MG tablet Take 1 tablet (75 mg total) by mouth daily with breakfast. 90 tablet 1  . fluticasone (FLONASE) 50 MCG/ACT nasal spray Place 1 spray into both nostrils daily. 16 g 0  . gabapentin (NEURONTIN) 300 MG capsule Take 3 capsules (900 mg total) by mouth 2 (two) times daily. 180 capsule 5  . hydrALAZINE (APRESOLINE) 100 MG tablet Take 1 tablet (100 mg total) by mouth 3 (three) times daily. 90 tablet 2  . hydrochlorothiazide (HYDRODIURIL) 25 MG tablet TAKE 1 TABLET (25 MG TOTAL) BY MOUTH DAILY. 30 tablet 2  . loratadine (CLARITIN) 10 MG tablet Take 1 tablet (10 mg total) by mouth daily. 30 tablet 11  . metFORMIN (GLUMETZA) 500 MG (MOD) 24 hr tablet Take 2 tablets (1,000 mg total) by mouth 2 (two) times daily with a meal. 120 tablet 4  . Multiple Vitamins-Minerals (MULTIVITAMIN ADULT) CHEW Chew 1 tablet by mouth daily.    Marland Kitchen spironolactone (ALDACTONE) 25 MG tablet Take 1 tablet (25 mg total) by mouth daily. 30 tablet 4  . triamcinolone cream (KENALOG) 0.1 % Apply 1 application topically 2 (two) times daily. 30 g 0  . TRUEPLUS PEN NEEDLES 31G X 5 MM MISC See admin instructions.  11  . Vitamin D, Ergocalciferol, (DRISDOL) 1.25 MG (50000 UT) CAPS capsule Take 1 capsule (50,000 Units total) by mouth every 7 (seven) days. 16 capsule 0   No current facility-administered medications on file prior to visit.     Allergies  Allergen Reactions  . Other Swelling    Seaweed= swelling on arms, hands and face  . Metformin And Related Nausea Only    Family History  Problem Relation Age of Onset  . Diabetes Father   . Hypertension Father   . Kidney disease Mother   . Hypertension Mother   . Cancer Maternal Grandmother   . Hypertension Maternal Grandfather   . Diabetes Paternal Aunt   . Diabetes Paternal Uncle   . Diabetes Paternal Grandfather   . Colon cancer Neg Hx   . Breast cancer Neg Hx     BP (!) 148/72 (BP Location: Left  Arm, Patient Position: Sitting, Cuff Size: Large)   Pulse 73   Ht 6' (1.829 m)   Wt 234 lb 3.2 oz (106.2 kg)   SpO2 96%   BMI 31.76 kg/m   Review of Systems She denies LOC    Objective:   Physical Exam VITAL SIGNS:  See vs page GENERAL: no distress Pulses: dorsalis pedis absent bilat (poss due to edema).  MSK:  no deformity of the feet, except left great toe is absent.   CV: 1+ bilat leg edema Skin:  no ulcer on the feet.  normal color and temp on the feet.  Neuro: sensation is intact to touch on the feet, but decreased from normal.   Ext: There is bilateral onychomycosis of the toenails.  Right great toenail is absent (recently removed)  Lab Results  Component Value Date   HGBA1C 11.0 (H) 12/31/2018       Assessment & Plan:  HTN: is noted today.   Type 1 DM, with PAD: worse.   Renal insuff: this is contributing to fasting hypoglycemia: she has tried faster-acting qd insulin, but it did not work out.   Patient Instructions  Your blood pressure is high today.  Please see your primary care provider soon, to have it rechecked Please continue the same lantus.  On Sundays, take just 130 units.  To avoid the blood sugar going low in the early morning, eat a light snack at bedtime.   check your blood sugar twice a day.  vary the time of day when you check, between before the 3 meals, and at bedtime.  also check if you have symptoms of your blood sugar being too high or too low.  please keep a record of the readings and bring it to your next appointment here (or you can bring the meter itself).  You can write it on any piece of paper.  please call us sooner if your blood sugar goes below 70, or if you have a lot of readings over 200.   Please come back for a follow-up appointment in 2 months.

## 2019-01-10 ENCOUNTER — Other Ambulatory Visit: Payer: Self-pay | Admitting: Cardiovascular Disease

## 2019-01-10 MED FILL — CLOPIDOGREL 75 MG TABLET: 75 | 30 days supply | Qty: 30 | Fill #0

## 2019-01-20 MED FILL — $LANTUS SOLOSTAR 100 UNITS/: 100 | 33 days supply | Qty: 60 | Fill #0

## 2019-02-06 ENCOUNTER — Ambulatory Visit: Payer: Self-pay | Admitting: Internal Medicine

## 2019-02-06 ENCOUNTER — Ambulatory Visit: Payer: Self-pay | Attending: Internal Medicine | Admitting: Internal Medicine

## 2019-02-06 ENCOUNTER — Encounter: Payer: Self-pay | Admitting: Internal Medicine

## 2019-02-06 ENCOUNTER — Other Ambulatory Visit: Payer: Self-pay

## 2019-02-06 VITALS — BP 133/77 | HR 71 | Temp 97.9°F | Resp 16 | Wt 233.8 lb

## 2019-02-06 DIAGNOSIS — E1142 Type 2 diabetes mellitus with diabetic polyneuropathy: Secondary | ICD-10-CM

## 2019-02-06 DIAGNOSIS — Z1331 Encounter for screening for depression: Secondary | ICD-10-CM

## 2019-02-06 DIAGNOSIS — IMO0002 Reserved for concepts with insufficient information to code with codable children: Secondary | ICD-10-CM

## 2019-02-06 DIAGNOSIS — I10 Essential (primary) hypertension: Secondary | ICD-10-CM

## 2019-02-06 DIAGNOSIS — I701 Atherosclerosis of renal artery: Secondary | ICD-10-CM

## 2019-02-06 DIAGNOSIS — E1165 Type 2 diabetes mellitus with hyperglycemia: Secondary | ICD-10-CM

## 2019-02-06 NOTE — Progress Notes (Signed)
Patient ID: Katherine [name], female    DOB: 28-Dec-1955  MRN: [id number]  CC: Follow-up (5 week )   Subjective: Katherine [name] is a 63 y.o. female who presents for 5 wks f/u HTN Her concerns today include:  Hx of HTN, DM with neuropathy, retinopathyand possible gastroparesis, HL, depression, GERD, PAD, s/p amputation LT 1st toe.  HTN:  Checking BP several times a wk.  Reports only three times SBP in the 140s, rest of the times it has been in the 130s.  DBP in 80s Reports compliance with meds.  Tolerating the 25 mg of Spironolactone Renal Doppler revealed mild renal artery stenosis up to 59% bilaterally  DM: Saw Dr. Loanne Drilling since last visit.  Patient advised to take Lantus 130 units on Sundays and 160 on other days.  Compliant with metformin. Checks BS 2 x a wk. he is left-handed and is only able to check using her right hand reports BS better.  Would like Libre Meter but can not afford  Patient Active Problem List   Diagnosis Date Noted  . Diabetic polyneuropathy associated with type 2 diabetes mellitus ([place]) 05/14/2018  . Thyroid nodule 10/04/2017  . Microcytic anemia 09/11/2017  . Critical lower limb ischemia 09/10/2017  . Retinopathy 08/22/2017  . Thyroid enlargement 07/12/2017  . Dyslipidemia 09/20/2016  . DEPRESSION 09/10/2009  . LEG CRAMPS 05/19/2009  . GERD 02/15/2009  . Gastroparesis 09/03/2008  . DENTAL CARIES 07/14/2008  . Diabetes mellitus type 2 with complications, uncontrolled ([place]) 09/16/2003  . Essential hypertension 09/16/2003     Current Outpatient Medications on File Prior to Visit  Medication Sig Dispense Refill  . amLODipine (NORVASC) 10 MG tablet Take 1 tablet (10 mg total) by mouth daily. 90 tablet 3  . atorvastatin (LIPITOR) 80 MG tablet Take 1 tablet (80 mg total) by mouth daily at 6 PM. 30 tablet 6  . carvedilol (COREG) 6.25 MG tablet Take 1 tablet (6.25 mg total) by mouth 2 (two) times daily with a meal. 60 tablet 3  . clopidogrel (PLAVIX) 75 MG  tablet TAKE 1 TABLET BY MOUTH DAILY WITH BREAKFAST. 90 tablet 1  . fluticasone (FLONASE) 50 MCG/ACT nasal spray Place 1 spray into both nostrils daily. 16 g 0  . gabapentin (NEURONTIN) 300 MG capsule Take 3 capsules (900 mg total) by mouth 2 (two) times daily. 180 capsule 5  . hydrALAZINE (APRESOLINE) 100 MG tablet Take 1 tablet (100 mg total) by mouth 3 (three) times daily. 90 tablet 2  . hydrochlorothiazide (HYDRODIURIL) 25 MG tablet TAKE 1 TABLET (25 MG TOTAL) BY MOUTH DAILY. 30 tablet 2  . Insulin Glargine (LANTUS SOLOSTAR) 100 UNIT/ML Solostar Pen Inject 180 Units into the skin every morning. And pen needles 1/day 20 pen 2  . loratadine (CLARITIN) 10 MG tablet Take 1 tablet (10 mg total) by mouth daily. 30 tablet 11  . metFORMIN (GLUMETZA) 500 MG (MOD) 24 hr tablet Take 2 tablets (1,000 mg total) by mouth 2 (two) times daily with a meal. 120 tablet 4  . Multiple Vitamins-Minerals (MULTIVITAMIN ADULT) CHEW Chew 1 tablet by mouth daily.    Marland Kitchen spironolactone (ALDACTONE) 25 MG tablet Take 1 tablet (25 mg total) by mouth daily. 30 tablet 4  . triamcinolone cream (KENALOG) 0.1 % Apply 1 application topically 2 (two) times daily. 30 g 0  . TRUEPLUS PEN NEEDLES 31G X 5 MM MISC See admin instructions.  11  . Vitamin D, Ergocalciferol, (DRISDOL) 1.25 MG (50000 UT) CAPS capsule Take 1  capsule (50,000 Units total) by mouth every 7 (seven) days. 16 capsule 0   No current facility-administered medications on file prior to visit.     Allergies  Allergen Reactions  . Other Swelling    Seaweed= swelling on arms, hands and face  . Metformin And Related Nausea Only    Social History   Socioeconomic History  . Marital status: Married    Spouse name: Not on file  . Number of children: Not on file  . Years of education: Not on file  . Highest education level: Not on file  Occupational History  . Not on file  Social Needs  . Financial resource strain: Not on file  . Food insecurity:    Worry: Not  on file    Inability: Not on file  . Transportation needs:    Medical: Not on file    Non-medical: Not on file  Tobacco Use  . Smoking status: Never Smoker  . Smokeless tobacco: Never Used  Substance and Sexual Activity  . Alcohol use: No  . Drug use: No  . Sexual activity: Never  Lifestyle  . Physical activity:    Days per week: Not on file    Minutes per session: Not on file  . Stress: Not on file  Relationships  . Social connections:    Talks on phone: Not on file    Gets together: Not on file    Attends religious service: Not on file    Active member of club or organization: Not on file    Attends meetings of clubs or organizations: Not on file    Relationship status: Not on file  . Intimate partner violence:    Fear of current or ex partner: Not on file    Emotionally abused: Not on file    Physically abused: Not on file    Forced sexual activity: Not on file  Other Topics Concern  . Not on file  Social History Narrative  . Not on file    Family History  Problem Relation Age of Onset  . Diabetes Father   . Hypertension Father   . Kidney disease Mother   . Hypertension Mother   . Cancer Maternal Grandmother   . Hypertension Maternal Grandfather   . Diabetes Paternal Aunt   . Diabetes Paternal Uncle   . Diabetes Paternal Grandfather   . Colon cancer Neg Hx   . Breast cancer Neg Hx     Past Surgical History:  Procedure Laterality Date  . AMPUTATION TOE Left 10/26/2017   Procedure: AMPUTATION PARTIAL RAY LEFT FOOT AND IRRIGATION/DEBRIDEMENT;  Surgeon: Edrick Kins, DPM;  Location: [facility];  Service: Podiatry;  Laterality: Left;  AMPUTATION PARTIAL RAY LEFT FOOT AND IRRIGATION/DEBRIDEMENT  . CHOLECYSTECTOMY  01/24/2003   Archie Endo 04/04/2011  . COLONOSCOPY    . INCISION AND DRAINAGE ABSCESS  03/17/2012   "right but; left lower abdoment"/notes 03/17/2012  . LOWER EXTREMITY INTERVENTION N/A 09/10/2017   Procedure: LOWER EXTREMITY INTERVENTION;  Surgeon: Lorretta Harp, MD;  Location: [facility];  Service: Cardiovascular;  Laterality: N/A;  . PERIPHERAL VASCULAR ATHERECTOMY  09/10/2017   Procedure: PERIPHERAL VASCULAR ATHERECTOMY;  Surgeon: Lorretta Harp, MD;  Location: [facility];  Service: Cardiovascular;;  left AT    ROS: Review of Systems Negative except as stated above  PHYSICAL EXAM: BP 133/77   Pulse 71   Temp 97.9 F (36.6 C) (Oral)   Resp 16   Wt 233  lb 12.8 oz (106.1 kg)   SpO2 98%   BMI 31.71 kg/m   Wt Readings from Last 3 Encounters:  02/06/19 233 lb 12.8 oz (106.1 kg)  01/09/19 234 lb 3.2 oz (106.2 kg)  12/31/18 235 lb 12.8 oz (107 kg)     Physical Exam  General appearance - alert, well appearing, and in no distress Mental status - normal mood, behavior, speech, dress, motor activity, and thought processes Chest - clear to auscultation, no wheezes, rales or rhonchi, symmetric air entry Heart - normal rate, regular rhythm, normal S1, S2, no murmurs, rubs, clicks or gallops  Depression screen [facility] 2/9 02/06/2019 10/24/2018 08/28/2018  Decreased Interest 2 1 1   Down, Depressed, Hopeless 3 1 3   PHQ - 2 Score 5 2 4   Altered sleeping 1 3 2   Tired, decreased energy 2 3 2   Change in appetite 1 2 2   Feeling bad or failure about yourself  1 0 2  Trouble concentrating 3 2 3   Moving slowly or fidgety/restless 0 0 1  Suicidal thoughts 0 0 1  PHQ-9 Score 13 12 17   Some recent data might be hidden    CMP Latest Ref Rng & Units 12/11/2018 12/04/2018 07/19/2018  Glucose 70 - 99 mg/dL 201(H) 345(H) -  BUN 8 - 23 mg/dL 13 13 -  Creatinine 0.44 - 1.00 mg/dL 0.90 0.94 -  Sodium 135 - 145 mmol/L 137 138 -  Potassium 3.5 - 5.1 mmol/L 3.9 4.4 -  Chloride 98 - 111 mmol/L 103 94(L) -  CO2 22 - 32 mmol/L 26 29 -  Calcium 8.9 - 10.3 mg/dL 8.9 9.2 -  Total Protein 6.0 - 8.5 g/dL - - 7.2  Total Bilirubin 0.0 - 1.2 mg/dL - - 0.6  Alkaline Phos 39 - 117 IU/L - - 147(H)  AST 0 - 40 IU/L - - 8  ALT 0 - 32 IU/L - - 10    Lipid Panel     Component Value Date/Time   CHOL 130 07/19/2018 1009   TRIG 116 07/19/2018 1009   HDL 52 07/19/2018 1009   CHOLHDL 2.5 07/19/2018 1009   CHOLHDL 3.8 09/19/2016 1452   VLDL 36 (H) 09/19/2016 1452   LDLCALC 55 07/19/2018 1009    CBC    Component Value Date/Time   WBC 4.5 12/11/2018 1352   RBC 4.43 12/11/2018 1352   HGB 12.2 12/11/2018 1352   HGB 12.4 08/28/2018 1424   HCT 38.4 12/11/2018 1352   HCT 36.9 08/28/2018 1424   PLT 249 12/11/2018 1352   PLT 210 08/28/2018 1424   MCV 86.7 12/11/2018 1352   MCV 86 08/28/2018 1424   MCH 27.5 12/11/2018 1352   MCHC 31.8 12/11/2018 1352   RDW 13.2 12/11/2018 1352   RDW 12.9 08/28/2018 1424   LYMPHSABS 1.9 08/28/2018 1424   MONOABS 0.4 05/24/2018 1239   EOSABS 0.2 08/28/2018 1424   BASOSABS 0.0 08/28/2018 1424    ASSESSMENT AND PLAN: 1. Uncontrolled type 2 diabetes mellitus with peripheral neuropathy ([place]) Patient to continue Lantus and metformin as instructed by endocrinology.  Continue to encourage her with healthy eating habits. - POCT glucose (manual entry)  2. Essential hypertension Blood pressure today is much better.  Continue current medications.  Advised her to continue to monitor blood pressure  3. Renal artery stenosis, non-flow-limiting ([facility]) Not at the level to warrant any intervention and even if it was significant there is mixed opinion as to whether intervention helps in decreasing blood pressure  Patient was given the opportunity to ask questions.  Patient verbalized understanding of the plan and was able to repeat key elements of the plan.   Orders Placed This Encounter  Procedures  . POCT glucose (manual entry)     Requested Prescriptions    No prescriptions requested or ordered in this encounter    Return in about 2 months (around 04/08/2019).  Karle Plumber, MD, FACP

## 2019-02-07 LAB — GLUCOSE, POCT (MANUAL RESULT ENTRY): POC Glucose: 165 mg/dl — AB (ref 70–99)

## 2019-02-11 ENCOUNTER — Other Ambulatory Visit: Payer: Self-pay | Admitting: Internal Medicine

## 2019-02-11 DIAGNOSIS — I10 Essential (primary) hypertension: Secondary | ICD-10-CM

## 2019-02-11 MED FILL — METFORMIN HCL ER 500 MG TAB: 500 | 30 days supply | Qty: 60 | Fill #1

## 2019-03-07 ENCOUNTER — Encounter: Payer: Self-pay | Admitting: Endocrinology

## 2019-03-10 ENCOUNTER — Ambulatory Visit (INDEPENDENT_AMBULATORY_CARE_PROVIDER_SITE_OTHER): Payer: Self-pay | Admitting: Endocrinology

## 2019-03-10 ENCOUNTER — Other Ambulatory Visit: Payer: Self-pay

## 2019-03-10 DIAGNOSIS — E1165 Type 2 diabetes mellitus with hyperglycemia: Secondary | ICD-10-CM

## 2019-03-10 DIAGNOSIS — IMO0002 Reserved for concepts with insufficient information to code with codable children: Secondary | ICD-10-CM

## 2019-03-10 DIAGNOSIS — E118 Type 2 diabetes mellitus with unspecified complications: Secondary | ICD-10-CM

## 2019-03-10 NOTE — Progress Notes (Addendum)
Subjective:    Patient ID: Katherine [name], female    DOB: 1956/05/02, 63 y.o.   MRN: [id number]  HPI telehealth visit today via Telephone visit today.  Total time=10 minutes Alternatives to telehealth are presented to this patient, and the patient agrees to the telehealth visit. Pt is advised of the cost of the visit, and agrees to this, also.   Patient is at home, and I am at the office.   Alternatives to telehealth are presented to this patient, and the patient agrees to the visit. She is advised of the cost of the visit, and she agrees to this, also.   Patient is at home, and I am at the office.   Pt returns for f/u of diabetes mellitus:  DM type: 1 Dx'ed: 6712 Complications: polyneuropathy, gastroparesis, foot ulcer, renal insuff, PAD, and DR.  Therapy: insulin since 2014 GDM: never DKA: once (2016) Severe hypoglycemia: never.   Pancreatitis: never.   Pancreatic imaging: normal on 2016 CT.   Other: she gets insulin through [facility]; she declines multiple daily injections; she did not tolerate levemir (rash); she was changed to NPH, then to 70/30, due to the pattern of her cbg's.  However, she did not do well on this, so she was changed back to lantus. Interval history: no cbg record, but states cbg's vary from 80-286.  It is in general higher as the day goes on.  Pt says she continues to miss the insulin every Sunday, because she does not eat breakfast that day.   Past Medical History:  Diagnosis Date  . Anemia    requiring transfusion  . Boil of buttock ~ 2016  . Depression   . Diabetic neuropathy ([place])    Archie Endo 08/23/2017  . DKA (diabetic ketoacidoses) ([place])    recent/notes 01/06/2015  . Hyperlipidemia   . Hypertension   . Neuropathy   . Peripheral vascular disease ([place])   . Toe ulcer ([place])    left great toe/notes 08/23/2017  . Type II diabetes mellitus ([place])     Past Surgical History:  Procedure Laterality Date  . AMPUTATION TOE Left 10/26/2017   Procedure: AMPUTATION  PARTIAL RAY LEFT FOOT AND IRRIGATION/DEBRIDEMENT;  Surgeon: Edrick Kins, DPM;  Location: [facility];  Service: Podiatry;  Laterality: Left;  AMPUTATION PARTIAL RAY LEFT FOOT AND IRRIGATION/DEBRIDEMENT  . CHOLECYSTECTOMY  01/24/2003   Archie Endo 04/04/2011  . COLONOSCOPY    . INCISION AND DRAINAGE ABSCESS  03/17/2012   "right but; left lower abdoment"/notes 03/17/2012  . LOWER EXTREMITY INTERVENTION N/A 09/10/2017   Procedure: LOWER EXTREMITY INTERVENTION;  Surgeon: Lorretta Harp, MD;  Location: [facility];  Service: Cardiovascular;  Laterality: N/A;  . PERIPHERAL VASCULAR ATHERECTOMY  09/10/2017   Procedure: PERIPHERAL VASCULAR ATHERECTOMY;  Surgeon: Lorretta Harp, MD;  Location: [facility];  Service: Cardiovascular;;  left AT    Social History   Socioeconomic History  . Marital status: Married    Spouse name: Not on file  . Number of children: Not on file  . Years of education: Not on file  . Highest education level: Not on file  Occupational History  . Not on file  Social Needs  . Financial resource strain: Not on file  . Food insecurity:    Worry: Not on file    Inability: Not on file  . Transportation needs:    Medical: Not on file    Non-medical: Not on file  Tobacco Use  . Smoking status:  Never Smoker  . Smokeless tobacco: Never Used  Substance and Sexual Activity  . Alcohol use: No  . Drug use: No  . Sexual activity: Never  Lifestyle  . Physical activity:    Days per week: Not on file    Minutes per session: Not on file  . Stress: Not on file  Relationships  . Social connections:    Talks on phone: Not on file    Gets together: Not on file    Attends religious service: Not on file    Active member of club or organization: Not on file    Attends meetings of clubs or organizations: Not on file    Relationship status: Not on file  . Intimate partner violence:    Fear of current or ex partner: Not on file    Emotionally abused: Not on file     Physically abused: Not on file    Forced sexual activity: Not on file  Other Topics Concern  . Not on file  Social History Narrative  . Not on file    Current Outpatient Medications on File Prior to Visit  Medication Sig Dispense Refill  . amLODipine (NORVASC) 10 MG tablet Take 1 tablet (10 mg total) by mouth daily. 90 tablet 3  . atorvastatin (LIPITOR) 80 MG tablet Take 1 tablet (80 mg total) by mouth daily at 6 PM. 30 tablet 6  . carvedilol (COREG) 6.25 MG tablet Take 1 tablet (6.25 mg total) by mouth 2 (two) times daily with a meal. 60 tablet 3  . clopidogrel (PLAVIX) 75 MG tablet TAKE 1 TABLET BY MOUTH DAILY WITH BREAKFAST. 90 tablet 1  . fluticasone (FLONASE) 50 MCG/ACT nasal spray Place 1 spray into both nostrils daily. 16 g 0  . gabapentin (NEURONTIN) 300 MG capsule Take 3 capsules (900 mg total) by mouth 2 (two) times daily. 180 capsule 5  . hydrALAZINE (APRESOLINE) 100 MG tablet Take 1 tablet (100 mg total) by mouth 3 (three) times daily. 90 tablet 2  . hydrochlorothiazide (HYDRODIURIL) 25 MG tablet TAKE 1 TABLET (25 MG TOTAL) BY MOUTH DAILY. 30 tablet 2  . Insulin Glargine (LANTUS SOLOSTAR) 100 UNIT/ML Solostar Pen Inject 180 Units into the skin every morning. And pen needles 1/day 20 pen 2  . loratadine (CLARITIN) 10 MG tablet Take 1 tablet (10 mg total) by mouth daily. 30 tablet 11  . metFORMIN (GLUMETZA) 500 MG (MOD) 24 hr tablet Take 2 tablets (1,000 mg total) by mouth 2 (two) times daily with a meal. 120 tablet 4  . Multiple Vitamins-Minerals (MULTIVITAMIN ADULT) CHEW Chew 1 tablet by mouth daily.    Marland Kitchen spironolactone (ALDACTONE) 25 MG tablet Take 1 tablet (25 mg total) by mouth daily. 30 tablet 4  . triamcinolone cream (KENALOG) 0.1 % Apply 1 application topically 2 (two) times daily. 30 g 0  . TRUEPLUS PEN NEEDLES 31G X 5 MM MISC See admin instructions.  11  . Vitamin D, Ergocalciferol, (DRISDOL) 1.25 MG (50000 UT) CAPS capsule Take 1 capsule (50,000 Units total) by mouth  every 7 (seven) days. 16 capsule 0   No current facility-administered medications on file prior to visit.     Allergies  Allergen Reactions  . Other Swelling    Seaweed= swelling on arms, hands and face  . Metformin And Related Nausea Only    Family History  Problem Relation Age of Onset  . Diabetes Father   . Hypertension Father   . Kidney disease Mother   .  Hypertension Mother   . Cancer Maternal Grandmother   . Hypertension Maternal Grandfather   . Diabetes Paternal Aunt   . Diabetes Paternal Uncle   . Diabetes Paternal Grandfather   . Colon cancer Neg Hx   . Breast cancer Neg Hx      Review of Systems She denies hypoglycemia.      Objective:   Physical Exam     Assessment & Plan:  Insulin-requiring type 2 DM, with PAD: uncertain glycemic control  Patient Instructions  Please continue the same lantus.  On Sundays, take just 130 units.   To avoid the blood sugar going low in the early morning, eat a light snack at bedtime.   Please hacve the a1c checked with Dr Wynetta Emery when you see her next month check your blood sugar twice a day.  vary the time of day when you check, between before the 3 meals, and at bedtime.  also check if you have symptoms of your blood sugar being too high or too low.  please keep a record of the readings and bring it to your next appointment here (or you can bring the meter itself).  You can write it on any piece of paper.  please call us sooner if your blood sugar goes below 70, or if you have a lot of readings over 200.   Please come back for a follow-up appointment in 3 months.

## 2019-03-10 NOTE — Patient Instructions (Addendum)
Please continue the same lantus.  On Sundays, take just 130 units.   To avoid the blood sugar going low in the early morning, eat a light snack at bedtime.   Please hacve the a1c checked with Dr Wynetta Emery when you see her next month check your blood sugar twice a day.  vary the time of day when you check, between before the 3 meals, and at bedtime.  also check if you have symptoms of your blood sugar being too high or too low.  please keep a record of the readings and bring it to your next appointment here (or you can bring the meter itself).  You can write it on any piece of paper.  please call us sooner if your blood sugar goes below 70, or if you have a lot of readings over 200.   Please come back for a follow-up appointment in 3 months.

## 2019-03-27 MED FILL — $LANTUS SOLOSTAR 100 UNITS/: 100 | 66 days supply | Qty: 120 | Fill #1

## 2019-03-27 MED FILL — HYDROCHLOROTHIAZIDE 25 MG T: 25 | 30 days supply | Qty: 30 | Fill #0

## 2019-04-15 ENCOUNTER — Ambulatory Visit: Payer: Self-pay | Attending: Internal Medicine | Admitting: Internal Medicine

## 2019-04-15 ENCOUNTER — Encounter: Payer: Self-pay | Admitting: Internal Medicine

## 2019-04-15 ENCOUNTER — Other Ambulatory Visit: Payer: Self-pay

## 2019-04-15 DIAGNOSIS — E1143 Type 2 diabetes mellitus with diabetic autonomic (poly)neuropathy: Secondary | ICD-10-CM

## 2019-04-15 DIAGNOSIS — E1142 Type 2 diabetes mellitus with diabetic polyneuropathy: Secondary | ICD-10-CM

## 2019-04-15 DIAGNOSIS — K3184 Gastroparesis: Secondary | ICD-10-CM

## 2019-04-15 DIAGNOSIS — I10 Essential (primary) hypertension: Secondary | ICD-10-CM

## 2019-04-15 DIAGNOSIS — IMO0002 Reserved for concepts with insufficient information to code with codable children: Secondary | ICD-10-CM

## 2019-04-15 DIAGNOSIS — E1165 Type 2 diabetes mellitus with hyperglycemia: Secondary | ICD-10-CM

## 2019-04-15 MED ORDER — GLUCOSE BLOOD VI STRP: ORAL_STRIP | 12 refills | Status: DC

## 2019-04-15 MED ORDER — GABAPENTIN 300 MG PO CAPS: 900.0000 mg | ORAL_CAPSULE | Freq: Two times a day (BID) | ORAL | 5 refills | Status: DC

## 2019-04-15 MED ORDER — METOCLOPRAMIDE HCL 5 MG PO TABS: 5.0000 mg | ORAL_TABLET | Freq: Two times a day (BID) | ORAL | 1 refills | Status: DC | PRN

## 2019-04-15 MED FILL — GABAPENTIN 300 MG CAPSULE: 300 | 30 days supply | Qty: 180 | Fill #1

## 2019-04-15 MED FILL — METOCLOPRAMIDE 5 MG TABLET: 5 | 30 days supply | Qty: 60 | Fill #0

## 2019-04-15 MED FILL — CLOPIDOGREL 75 MG TABLET: 75 | 30 days supply | Qty: 30 | Fill #1

## 2019-04-15 MED FILL — TRUE METRIX TEST STRIP: 30 days supply | Qty: 100 | Fill #0

## 2019-04-15 NOTE — Progress Notes (Signed)
Pt states she has been having pain in her right foot for 2 days now  Pt states her blood sugar this morning was 196  Pt is wanting to know if pcp is going to keep her on vit D2   Pt states the metformin has been making her nausea  Pt is wanting to know if it is okay for her to take omega fish oil  Pt states she has started taking centrium silver vitamins  Pt is wanting to know about leg trimmed pm if she is able to take it

## 2019-04-15 NOTE — Progress Notes (Signed)
Virtual Visit via Telephone Note Due to current restrictions/limitations of in-office visits due to the COVID-19 pandemic, this scheduled clinical appointment was converted to a telehealth visit  I connected with Katherine [name] on 04/15/19 at  by telephone at 12:18 p.m and verified that I am speaking with the correct person using two identifiers. I am in my office.  The patient is at home.  Only the patient and myself participated in this encounter.  I discussed the limitations, risks, security and privacy concerns of performing an evaluation and management service by telephone and the availability of in person appointments. I also discussed with the patient that there may be a patient responsible charge related to this service. The patient expressed understanding and agreed to proceed.  History of Present Illness: Hx of HTN, DM with neuropathy, retinopathyand possible gastroparesis, HL, depression, GERD, PAD, s/p amputation LT 1st toe, BL RAS 59%.  DM:  Saw Dr. Loanne Drilling about 5 wks ago. -almost out of insulin for about 4 days last wk while waiting for insulin to come in mail.  So she took only 80 units of Lantus until she received her Lantus in mail -Reports taking Lantus 160 units daily except Sundays when she takes 130.  Compliant with Metformin.  Checks BS only 2 x a wk in past 2 wks because she is running low on test stripes. Gives range around 150 or lower.  BS this a.m was 196.  Metformin causes nausea.  Has not taken Metformin in 2 days because of this.  Used to be on antiemetic which she took as needed about twice a week.  This kept the nausea controlled such that she was able to take the metformin.  She is requesting a refill but does not recall the name of the antiemetic.  She is thought to have some component of gastroparesis -last A1C was 11 12/2018 -Eating habits:  Feels she is doing okay but decrease appetite   HTN:  Just got new wrist BP cuff.  She reports this is easier for her to  work with than the arm inflation device.  -checks BP 2 x a wk.  BP this a.m 148/89.  Took her meds about 30 mins prior to this reading -reports compliance with meds  -limits salt -some swelling in RT ankle  Used to take an over-the-counter medication called Trim for leg cramps and leg pain prior to being placed on gabapentin.  She reports occasional cramps in the feet but was taking it more so for pain in her legs associated with neuropathy.  Wants to know if she can take this supplement with Gabapentin  Social History   Socioeconomic History  . Marital status: Married    Spouse name: Not on file  . Number of children: Not on file  . Years of education: Not on file  . Highest education level: Not on file  Occupational History  . Not on file  Social Needs  . Financial resource strain: Not on file  . Food insecurity:    Worry: Not on file    Inability: Not on file  . Transportation needs:    Medical: Not on file    Non-medical: Not on file  Tobacco Use  . Smoking status: Never Smoker  . Smokeless tobacco: Never Used  Substance and Sexual Activity  . Alcohol use: No  . Drug use: No  . Sexual activity: Never  Lifestyle  . Physical activity:    Days per week: Not on file  Minutes per session: Not on file  . Stress: Not on file  Relationships  . Social connections:    Talks on phone: Not on file    Gets together: Not on file    Attends religious service: Not on file    Active member of club or organization: Not on file    Attends meetings of clubs or organizations: Not on file    Relationship status: Not on file  . Intimate partner violence:    Fear of current or ex partner: Not on file    Emotionally abused: Not on file    Physically abused: Not on file    Forced sexual activity: Not on file  Other Topics Concern  . Not on file  Social History Narrative  . Not on file   Outpatient Encounter Medications as of 04/15/2019  Medication Sig  . amLODipine (NORVASC) 10  MG tablet Take 1 tablet (10 mg total) by mouth daily.  Marland Kitchen atorvastatin (LIPITOR) 80 MG tablet Take 1 tablet (80 mg total) by mouth daily at 6 PM.  . carvedilol (COREG) 6.25 MG tablet Take 1 tablet (6.25 mg total) by mouth 2 (two) times daily with a meal.  . clopidogrel (PLAVIX) 75 MG tablet TAKE 1 TABLET BY MOUTH DAILY WITH BREAKFAST.  . fluticasone (FLONASE) 50 MCG/ACT nasal spray Place 1 spray into both nostrils daily.  Marland Kitchen gabapentin (NEURONTIN) 300 MG capsule Take 3 capsules (900 mg total) by mouth 2 (two) times daily.  Marland Kitchen glucose blood (TRUE METRIX BLOOD GLUCOSE TEST) test strip Use as instructed  . hydrALAZINE (APRESOLINE) 100 MG tablet Take 1 tablet (100 mg total) by mouth 3 (three) times daily.  . hydrochlorothiazide (HYDRODIURIL) 25 MG tablet TAKE 1 TABLET (25 MG TOTAL) BY MOUTH DAILY.  Marland Kitchen Insulin Glargine (LANTUS SOLOSTAR) 100 UNIT/ML Solostar Pen Inject 180 Units into the skin every morning. And pen needles 1/day  . loratadine (CLARITIN) 10 MG tablet Take 1 tablet (10 mg total) by mouth daily.  . metFORMIN (GLUMETZA) 500 MG (MOD) 24 hr tablet Take 2 tablets (1,000 mg total) by mouth 2 (two) times daily with a meal.  . metoCLOPramide (REGLAN) 5 MG tablet Take 1 tablet (5 mg total) by mouth 2 (two) times daily as needed for nausea.  . Multiple Vitamins-Minerals (MULTIVITAMIN ADULT) CHEW Chew 1 tablet by mouth daily.  Marland Kitchen spironolactone (ALDACTONE) 25 MG tablet Take 1 tablet (25 mg total) by mouth daily.  Marland Kitchen triamcinolone cream (KENALOG) 0.1 % Apply 1 application topically 2 (two) times daily.  . TRUEPLUS PEN NEEDLES 31G X 5 MM MISC See admin instructions.  . Vitamin D, Ergocalciferol, (DRISDOL) 1.25 MG (50000 UT) CAPS capsule Take 1 capsule (50,000 Units total) by mouth every 7 (seven) days.  . [DISCONTINUED] gabapentin (NEURONTIN) 300 MG capsule Take 3 capsules (900 mg total) by mouth 2 (two) times daily.   No facility-administered encounter medications on file as of 04/15/2019.      Observations/Objective: No direct observation done as this was a telephone encounter.    Chemistry      Component Value Date/Time   NA 137 12/11/2018 1352   NA 138 12/04/2018 1213   K 3.9 12/11/2018 1352   CL 103 12/11/2018 1352   CO2 26 12/11/2018 1352   BUN 13 12/11/2018 1352   BUN 13 12/04/2018 1213   CREATININE 0.90 12/11/2018 1352   CREATININE 0.88 09/29/2015 1712      Component Value Date/Time   CALCIUM 8.9 12/11/2018 1352   ALKPHOS  147 (H) 07/19/2018 1009   AST 8 07/19/2018 1009   ALT 10 07/19/2018 1009   BILITOT 0.6 07/19/2018 1009     Lab Results  Component Value Date   WBC 4.5 12/11/2018   HGB 12.2 12/11/2018   HCT 38.4 12/11/2018   MCV 86.7 12/11/2018   PLT 249 12/11/2018     Assessment and Plan: 1. Uncontrolled type 2 diabetes mellitus with peripheral neuropathy ([place]) Patient does not check blood sugar often enough in the past 2 to 3 weeks to get a good sense of how her blood sugars are running.  Prescription sent to the pharmacy for test strips so that she can test at least twice a day.  She will continue current dose of Lantus.  We will put her on low dose of Reglan to use as needed for the nausea so that we can continue the Metformin. -Encourage her to continue healthy eating habits and try to move as much as she can. In regards to the over-the-counter supplement for neuropathy, I do not know the components of this supplement so was not able to advise constructively on this.  However I recommend that she not take it since she is on the gabapentin - Hemoglobin A1c; Future - gabapentin (NEURONTIN) 300 MG capsule; Take 3 capsules (900 mg total) by mouth 2 (two) times daily.  Dispense: 180 capsule; Refill: 5 - glucose blood (TRUE METRIX BLOOD GLUCOSE TEST) test strip; Use as instructed  Dispense: 100 each; Refill: 12  2. Gastroparesis due to DM [facility]) We will put her on a low dose of Reglan to use as needed.  Patient advised that this medication when use  consistently and in high doses can cause movement disorder that can sometimes be irreversible.  Patient states she will take it only when needed - metoCLOPramide (REGLAN) 5 MG tablet; Take 1 tablet (5 mg total) by mouth 2 (two) times daily as needed for nausea.  Dispense: 60 tablet; Refill: 1  3. Essential hypertension Reported blood pressure today not at goal but certainly acceptable for her considering what her blood pressure ran in the past.  Continue current medications including spironolactone, HCTZ, amlodipine, hydralazine   Follow Up Instructions: Follow-up in 2 to 3 months.  I discussed the assessment and treatment plan with the patient. The patient was provided an opportunity to ask questions and all were answered. The patient agreed with the plan and demonstrated an understanding of the instructions.   The patient was advised to call back or seek an in-person evaluation if the symptoms worsen or if the condition fails to improve as anticipated.  I provided 20 minutes of non-face-to-face time during this encounter.   Karle Plumber, MD

## 2019-07-03 ENCOUNTER — Other Ambulatory Visit: Payer: Self-pay

## 2019-07-04 MED FILL — GABAPENTIN 300 MG CAPSULE: 300 | 30 days supply | Qty: 180 | Fill #2

## 2019-07-04 MED FILL — TRUE METRIX TEST STRIP: 30 days supply | Qty: 100 | Fill #0

## 2019-07-04 MED FILL — SPIRONOLACTONE 25 MG TABLET: 25 | 30 days supply | Qty: 30 | Fill #1

## 2019-07-04 MED FILL — CARVEDILOL 6.25 MG TABLET: 6.25 | 30 days supply | Qty: 60 | Fill #2

## 2019-07-04 MED FILL — HYDROCHLOROTHIAZIDE 25 MG T: 25 | 30 days supply | Qty: 30 | Fill #1

## 2019-07-04 MED FILL — CLOPIDOGREL 75 MG TABLET: 75 | 30 days supply | Qty: 30 | Fill #2

## 2019-07-07 ENCOUNTER — Ambulatory Visit (INDEPENDENT_AMBULATORY_CARE_PROVIDER_SITE_OTHER): Payer: Self-pay | Admitting: Endocrinology

## 2019-07-07 ENCOUNTER — Other Ambulatory Visit: Payer: Self-pay

## 2019-07-07 ENCOUNTER — Encounter: Payer: Self-pay | Admitting: Endocrinology

## 2019-07-07 VITALS — BP 184/88 | HR 70 | Ht 72.0 in | Wt 230.8 lb

## 2019-07-07 DIAGNOSIS — E1165 Type 2 diabetes mellitus with hyperglycemia: Secondary | ICD-10-CM

## 2019-07-07 DIAGNOSIS — E1142 Type 2 diabetes mellitus with diabetic polyneuropathy: Secondary | ICD-10-CM

## 2019-07-07 DIAGNOSIS — E1151 Type 2 diabetes mellitus with diabetic peripheral angiopathy without gangrene: Secondary | ICD-10-CM

## 2019-07-07 DIAGNOSIS — I10 Essential (primary) hypertension: Secondary | ICD-10-CM

## 2019-07-07 DIAGNOSIS — IMO0002 Reserved for concepts with insufficient information to code with codable children: Secondary | ICD-10-CM

## 2019-07-07 LAB — POCT GLYCOSYLATED HEMOGLOBIN (HGB A1C): Hemoglobin A1C: 11.3 % — AB (ref 4.0–5.6)

## 2019-07-07 NOTE — Progress Notes (Signed)
Subjective:    Patient ID: Katherine [name], female    DOB: 04-Jan-1956, 63 y.o.   MRN: [id number]  HPI Pt returns for f/u of diabetes mellitus:  DM type: 1 Dx'ed: 6962 Complications: polyneuropathy, gastroparesis, foot ulcer, renal insuff, PAD, and DR.  Therapy: insulin since 2014 GDM: never DKA: once (2016) Severe hypoglycemia: never.   Pancreatitis: never.   Pancreatic imaging: normal on 2016 CT.   Other: she gets insulin through [facility]; she declines multiple daily injections; she did not tolerate levemir (rash); she was changed to NPH, then to 70/30, due to the pattern of her cbg's.  However, she did not do well on this, so she was changed back to lantus.   Interval history: no cbg record, but states cbg's vary from 150-260.  Pt says she is having trouble injecting insulin.  pt states she feels well in general.  Pt says she does not have a copay for the insulin.    Past Medical History:  Diagnosis Date   Anemia    requiring transfusion   Boil of buttock ~ 2016   Depression    Diabetic neuropathy ([place])    Archie Endo 08/23/2017   DKA (diabetic ketoacidoses) ([place])    recent/notes 01/06/2015   Hyperlipidemia    Hypertension    Neuropathy    Peripheral vascular disease ([place])    Toe ulcer ([place])    left great toe/notes 08/23/2017   Type II diabetes mellitus ([place])     Past Surgical History:  Procedure Laterality Date   AMPUTATION TOE Left 10/26/2017   Procedure: AMPUTATION PARTIAL RAY LEFT FOOT AND IRRIGATION/DEBRIDEMENT;  Surgeon: Edrick Kins, DPM;  Location: [place];  Service: Podiatry;  Laterality: Left;  AMPUTATION PARTIAL RAY LEFT FOOT AND IRRIGATION/DEBRIDEMENT   CHOLECYSTECTOMY  01/24/2003   Archie Endo 04/04/2011   COLONOSCOPY     INCISION AND DRAINAGE ABSCESS  03/17/2012   "right but; left lower abdoment"/notes 03/17/2012   LOWER EXTREMITY INTERVENTION N/A 09/10/2017   Procedure: LOWER EXTREMITY INTERVENTION;  Surgeon: Lorretta Harp, MD;  Location: [place] [facility];  Service: Cardiovascular;  Laterality: N/A;   PERIPHERAL VASCULAR ATHERECTOMY  09/10/2017   Procedure: PERIPHERAL VASCULAR ATHERECTOMY;  Surgeon: Lorretta Harp, MD;  Location: [place] [facility];  Service: Cardiovascular;;  left AT    Social History   Socioeconomic History   Marital status: Married    Spouse name: Not on file   Number of children: Not on file   Years of education: Not on file   Highest education level: Not on file  Occupational History   Not on file  Social Needs   Financial resource strain: Not on file   Food insecurity    Worry: Not on file    Inability: Not on file   Transportation needs    Medical: Not on file    Non-medical: Not on file  Tobacco Use   Smoking status: Never Smoker   Smokeless tobacco: Never Used  Substance and Sexual Activity   Alcohol use: No   Drug use: No   Sexual activity: Never  Lifestyle   Physical activity    Days per week: Not on file    Minutes per session: Not on file   Stress: Not on file  Relationships   Social connections    Talks on phone: Not on file    Gets together: Not on file    Attends religious service: Not on file    Active member of club  or organization: Not on file    Attends meetings of clubs or organizations: Not on file    Relationship status: Not on file   Intimate partner violence    Fear of current or ex partner: Not on file    Emotionally abused: Not on file    Physically abused: Not on file    Forced sexual activity: Not on file  Other Topics Concern   Not on file  Social History Narrative   Not on file    Current Outpatient Medications on File Prior to Visit  Medication Sig Dispense Refill   amLODipine (NORVASC) 10 MG tablet Take 1 tablet (10 mg total) by mouth daily. 90 tablet 3   atorvastatin (LIPITOR) 80 MG tablet Take 1 tablet (80 mg total) by mouth daily at 6 PM. 30 tablet 6   carvedilol (COREG) 6.25 MG tablet Take 1 tablet (6.25 mg total) by mouth 2  (two) times daily with a meal. 60 tablet 3   clopidogrel (PLAVIX) 75 MG tablet TAKE 1 TABLET BY MOUTH DAILY WITH BREAKFAST. 90 tablet 1   fluticasone (FLONASE) 50 MCG/ACT nasal spray Place 1 spray into both nostrils daily. 16 g 0   gabapentin (NEURONTIN) 300 MG capsule Take 3 capsules (900 mg total) by mouth 2 (two) times daily. 180 capsule 5   glucose blood (TRUE METRIX BLOOD GLUCOSE TEST) test strip Use as instructed 100 each 12   hydrALAZINE (APRESOLINE) 100 MG tablet Take 1 tablet (100 mg total) by mouth 3 (three) times daily. 90 tablet 2   hydrochlorothiazide (HYDRODIURIL) 25 MG tablet TAKE 1 TABLET (25 MG TOTAL) BY MOUTH DAILY. 30 tablet 2   Insulin Glargine (LANTUS SOLOSTAR) 100 UNIT/ML Solostar Pen Inject 180 Units into the skin every morning. And pen needles 1/day 20 pen 2   loratadine (CLARITIN) 10 MG tablet Take 1 tablet (10 mg total) by mouth daily. 30 tablet 11   metoCLOPramide (REGLAN) 5 MG tablet Take 1 tablet (5 mg total) by mouth 2 (two) times daily as needed for nausea. 60 tablet 1   Multiple Vitamins-Minerals (MULTIVITAMIN ADULT) CHEW Chew 1 tablet by mouth daily.     spironolactone (ALDACTONE) 25 MG tablet Take 1 tablet (25 mg total) by mouth daily. 30 tablet 4   triamcinolone cream (KENALOG) 0.1 % Apply 1 application topically 2 (two) times daily. 30 g 0   TRUEPLUS PEN NEEDLES 31G X 5 MM MISC See admin instructions.  11   Vitamin D, Ergocalciferol, (DRISDOL) 1.25 MG (50000 UT) CAPS capsule Take 1 capsule (50,000 Units total) by mouth every 7 (seven) days. 16 capsule 0   No current facility-administered medications on file prior to visit.     Allergies  Allergen Reactions   Other Swelling    Seaweed= swelling on arms, hands and face   Metformin And Related Nausea Only    Family History  Problem Relation Age of Onset   Diabetes Father    Hypertension Father    Kidney disease Mother    Hypertension Mother    Cancer Maternal Grandmother     Hypertension Maternal Grandfather    Diabetes Paternal Aunt    Diabetes Paternal Uncle    Diabetes Paternal Grandfather    Colon cancer Neg Hx    Breast cancer Neg Hx     BP (!) 184/88 (BP Location: Left Arm, Patient Position: Sitting, Cuff Size: Large)    Pulse 70    Ht 6' (1.829 m)    Wt 230 lb 12.8  oz (104.7 kg)    SpO2 94%    BMI 31.30 kg/m   Review of Systems She denies hypoglycemia.     Objective:   Physical Exam VITAL SIGNS:  See vs page GENERAL: no distress Pulses: dorsalis pedis intact bilat.   MSK: no deformity of the feet CV: no leg edema Skin:  no ulcer on the feet.  normal color and temp on the feet. Neuro: sensation is intact to touch on the feet  Lab Results  Component Value Date   HGBA1C 11.3 (A) 07/07/2019       Assessment & Plan:  HTN: is noted today type 1 DM, with PAD: Worse Injection technical difficulty: this may be accounting for the increased a1c  Patient Instructions  Your blood pressure is high today.  Please see your primary care provider soon, to have it rechecked Please continue the same lantus.  On Sundays, take just 130 units.   To avoid the blood sugar going low in the early morning, eat a light snack at bedtime.   Please see Vaughan Basta or Mickel Baas, to go over insulin injections.  check your blood sugar twice a day.  vary the time of day when you check, between before the 3 meals, and at bedtime.  also check if you have symptoms of your blood sugar being too high or too low.  please keep a record of the readings and bring it to your next appointment here (or you can bring the meter itself).  You can write it on any piece of paper.  please call us sooner if your blood sugar goes below 70, or if you have a lot of readings over 200.   Please come back for a follow-up appointment in 2 months.

## 2019-07-07 NOTE — Patient Instructions (Addendum)
Your blood pressure is high today.  Please see your primary care provider soon, to have it rechecked Please continue the same lantus.  On Sundays, take just 130 units.   To avoid the blood sugar going low in the early morning, eat a light snack at bedtime.   Please see Vaughan Basta or Mickel Baas, to go over insulin injections.  check your blood sugar twice a day.  vary the time of day when you check, between before the 3 meals, and at bedtime.  also check if you have symptoms of your blood sugar being too high or too low.  please keep a record of the readings and bring it to your next appointment here (or you can bring the meter itself).  You can write it on any piece of paper.  please call us sooner if your blood sugar goes below 70, or if you have a lot of readings over 200.   Please come back for a follow-up appointment in 2 months.

## 2019-07-17 MED FILL — ?HYDROCHLOROTHIAZIDE 25MG T: 25 | 30 days supply | Qty: 30 | Fill #1

## 2019-07-17 MED FILL — ?CARVEDILOL 6.25 MG TABLET: 6.25 | 30 days supply | Qty: 60 | Fill #2

## 2019-07-17 MED FILL — SPIRONOLACTONE 25 MG TABLET: 25 | 30 days supply | Qty: 30 | Fill #1

## 2019-07-17 MED FILL — GABAPENTIN 300 MG CAPSULE: 300 | 30 days supply | Qty: 180 | Fill #2

## 2019-07-17 MED FILL — CLOPIDOGREL 75 MG TABLET: 75 | 30 days supply | Qty: 30 | Fill #2

## 2019-07-21 ENCOUNTER — Telehealth: Payer: Self-pay | Admitting: Internal Medicine

## 2019-07-21 NOTE — Telephone Encounter (Signed)
New Message  Pt came in to drop off placard form and states she will wait for it. Please f/u

## 2019-07-22 ENCOUNTER — Ambulatory Visit: Payer: Self-pay | Admitting: Nutrition

## 2019-07-24 MED FILL — METOCLOPRAMIDE 5 MG TABLET: 5 | 30 days supply | Qty: 60 | Fill #1

## 2019-09-03 ENCOUNTER — Telehealth: Payer: Self-pay | Admitting: *Deleted

## 2019-09-03 NOTE — Telephone Encounter (Signed)
Unable to leave a message,mailbox is full. 

## 2019-09-09 ENCOUNTER — Ambulatory Visit: Payer: Self-pay | Admitting: Endocrinology

## 2019-10-24 MED FILL — ?HYDROCHLOROTHIAZIDE 25MG T: 25 | 30 days supply | Qty: 30 | Fill #2

## 2019-10-24 MED FILL — CLOPIDOGREL 75 MG TABLET: 75 | 30 days supply | Qty: 30 | Fill #3

## 2019-10-25 ENCOUNTER — Telehealth: Payer: Self-pay | Admitting: Internal Medicine

## 2019-10-25 ENCOUNTER — Encounter: Payer: Self-pay | Admitting: Internal Medicine

## 2019-10-25 NOTE — Telephone Encounter (Signed)
Patient got flu shot at flu clinic on sat.

## 2019-10-29 ENCOUNTER — Ambulatory Visit: Payer: Self-pay | Admitting: Pharmacist

## 2019-12-01 ENCOUNTER — Encounter: Payer: Self-pay | Admitting: Internal Medicine

## 2019-12-01 ENCOUNTER — Ambulatory Visit: Payer: Self-pay | Attending: Internal Medicine | Admitting: Internal Medicine

## 2019-12-01 VITALS — BP 116/69 | HR 65 | Resp 16 | Ht 72.0 in | Wt 230.2 lb

## 2019-12-01 DIAGNOSIS — E1165 Type 2 diabetes mellitus with hyperglycemia: Secondary | ICD-10-CM

## 2019-12-01 DIAGNOSIS — E1142 Type 2 diabetes mellitus with diabetic polyneuropathy: Secondary | ICD-10-CM

## 2019-12-01 DIAGNOSIS — I10 Essential (primary) hypertension: Secondary | ICD-10-CM

## 2019-12-01 DIAGNOSIS — E785 Hyperlipidemia, unspecified: Secondary | ICD-10-CM

## 2019-12-01 DIAGNOSIS — I701 Atherosclerosis of renal artery: Secondary | ICD-10-CM | POA: Insufficient documentation

## 2019-12-01 DIAGNOSIS — E1169 Type 2 diabetes mellitus with other specified complication: Secondary | ICD-10-CM

## 2019-12-01 DIAGNOSIS — IMO0002 Reserved for concepts with insufficient information to code with codable children: Secondary | ICD-10-CM

## 2019-12-01 LAB — POCT GLYCOSYLATED HEMOGLOBIN (HGB A1C): HbA1c, POC (controlled diabetic range): 11.9 % — AB (ref 0.0–7.0)

## 2019-12-01 LAB — GLUCOSE, POCT (MANUAL RESULT ENTRY): POC Glucose: 190 mg/dl — AB (ref 70–99)

## 2019-12-01 MED ORDER — SPIRONOLACTONE 25 MG PO TABS: 12.5000 mg | ORAL_TABLET | Freq: Every day | ORAL | 4 refills | Status: DC

## 2019-12-01 MED ORDER — METFORMIN HCL 500 MG PO TABS: 500.0000 mg | ORAL_TABLET | Freq: Two times a day (BID) | ORAL | 6 refills | Status: DC

## 2019-12-01 NOTE — Progress Notes (Signed)
Pt states she is having b/l feet pain

## 2019-12-01 NOTE — Patient Instructions (Signed)
Start Metformin 500 mg twice a day. Please try to take the Lantus insulin consistently as prescribed. Please apply for the orange card/cone discount card so that we can get you back in with the endocrinologist Dr. Loanne Drilling. Please try to get an eye exam done.  Decrease spironolactone to 25 mg a half tablet daily.

## 2019-12-01 NOTE — Progress Notes (Signed)
Patient ID: ASJIA HARPIN, female    DOB: 25-Jun-1956  MRN: [id number]  CC: Annual Exam   Subjective: Katherine [name] is a 64 y.o. female who presents for chronic ds management Her concerns today include:  Hx of HTN, DM with neuropathy, retinopathyand possible gastroparesis, HL, depression, GERD, PAD, s/p amputation LT 1st toe, BL RAS 59%.  DIABETES TYPE 2 Last A1C:   Results for orders placed or performed in visit on 12/01/19  Glucose (CBG)  Result Value Ref Range   POC Glucose 190 (A) 70 - 99 mg/dl  HgB A1c  Result Value Ref Range   Hemoglobin A1C     HbA1c POC (<> result, manual entry)     HbA1c, POC (prediabetic range)     HbA1c, POC (controlled diabetic range) 11.9 (A) 0.0 - 7.0 %    Med Adherence:  [x]  Yes - Saw Dr. Loanne Drilling 06/2019.  A1C was 11.3.  "If I don't eat breakfast, I don't take the insulin."  Skips dose about 2 x a wk.  Suppose to be on Lantus 180 units daily.  However pt states the pens go up to 80 units so she takes two inj of 80 units which is 160 units.  She states that she had informed Dr. Loanne Drilling of this and he told her that it was okay. Medication side effects:  []  Yes    [x]  No Home Monitoring?  []  Yes    [x]  No - not in past 2 wks Home glucose results range: Diet Adherence:  "I'm not doing that good."  Not eating regular meals. Eating more fruits Exercise: []  Yes    [x]  No Hypoglycemic episodes?: [x]  Yes -occasionally. Numbness of the feet? [x]  Yes    []  No Retinopathy hx? []  Yes    []  No Last eye exam:  Over due for eye exam.  She lacks insurance. Comments:   HTN:  reports compliance with meds except Spironolactone.  Reports that spironolactone makes her feel drunk when she takes it. Takes it QOD Checks BP at home sometimes.  DBP 85-110, SBP 130-142 No chest pains or shortness of breath  HL: compliant with taking atorvastatin.  Reports that she will need to have a TB skin test sometime in March.  Her husband has some type of chronic illness and is  followed by specialist at [facility] in [facility].  She is required to get a TB skin test every year because of her husband.  She does not give details about the chronic illness that he has and why she needs to have the TB test every year Patient Active Problem List   Diagnosis Date Noted  . Diabetic polyneuropathy associated with type 2 diabetes mellitus ([place]) 05/14/2018  . Thyroid nodule 10/04/2017  . Microcytic anemia 09/11/2017  . Critical lower limb ischemia 09/10/2017  . Retinopathy 08/22/2017  . Thyroid enlargement 07/12/2017  . Dyslipidemia 09/20/2016  . DEPRESSION 09/10/2009  . LEG CRAMPS 05/19/2009  . GERD 02/15/2009  . Gastroparesis 09/03/2008  . DENTAL CARIES 07/14/2008  . Diabetes mellitus type 2 with complications, uncontrolled ([place]) 09/16/2003  . Essential hypertension 09/16/2003     Current Outpatient Medications on File Prior to Visit  Medication Sig Dispense Refill  . amLODipine (NORVASC) 10 MG tablet Take 1 tablet (10 mg total) by mouth daily. 90 tablet 3  . atorvastatin (LIPITOR) 80 MG tablet Take 1 tablet (80 mg total) by mouth daily at 6 PM. 30 tablet 6  . carvedilol (COREG) 6.25 MG  tablet Take 1 tablet (6.25 mg total) by mouth 2 (two) times daily with a meal. 60 tablet 3  . clopidogrel (PLAVIX) 75 MG tablet TAKE 1 TABLET BY MOUTH DAILY WITH BREAKFAST. 90 tablet 1  . fluticasone (FLONASE) 50 MCG/ACT nasal spray Place 1 spray into both nostrils daily. 16 g 0  . gabapentin (NEURONTIN) 300 MG capsule Take 3 capsules (900 mg total) by mouth 2 (two) times daily. 180 capsule 5  . glucose blood (TRUE METRIX BLOOD GLUCOSE TEST) test strip Use as instructed 100 each 12  . hydrALAZINE (APRESOLINE) 100 MG tablet Take 1 tablet (100 mg total) by mouth 3 (three) times daily. 90 tablet 2  . hydrochlorothiazide (HYDRODIURIL) 25 MG tablet TAKE 1 TABLET (25 MG TOTAL) BY MOUTH DAILY. 30 tablet 2  . Insulin Glargine (LANTUS SOLOSTAR) 100 UNIT/ML Solostar Pen Inject 180 Units into the skin  every morning. And pen needles 1/day 20 pen 2  . loratadine (CLARITIN) 10 MG tablet Take 1 tablet (10 mg total) by mouth daily. 30 tablet 11  . metoCLOPramide (REGLAN) 5 MG tablet Take 1 tablet (5 mg total) by mouth 2 (two) times daily as needed for nausea. 60 tablet 1  . Multiple Vitamins-Minerals (MULTIVITAMIN ADULT) CHEW Chew 1 tablet by mouth daily.    Marland Kitchen triamcinolone cream (KENALOG) 0.1 % Apply 1 application topically 2 (two) times daily. 30 g 0  . TRUEPLUS PEN NEEDLES 31G X 5 MM MISC See admin instructions.  11  . Vitamin D, Ergocalciferol, (DRISDOL) 1.25 MG (50000 UT) CAPS capsule Take 1 capsule (50,000 Units total) by mouth every 7 (seven) days. 16 capsule 0   No current facility-administered medications on file prior to visit.    Allergies  Allergen Reactions  . Other Swelling    Seaweed= swelling on arms, hands and face  . Metformin And Related Nausea Only    Social History   Socioeconomic History  . Marital status: Married    Spouse name: Not on file  . Number of children: Not on file  . Years of education: Not on file  . Highest education level: Not on file  Occupational History  . Not on file  Tobacco Use  . Smoking status: Never Smoker  . Smokeless tobacco: Never Used  Substance and Sexual Activity  . Alcohol use: No  . Drug use: No  . Sexual activity: Never  Other Topics Concern  . Not on file  Social History Narrative  . Not on file   Social Determinants of Health   Financial Resource Strain:   . Difficulty of Paying Living Expenses: Not on file  Food Insecurity:   . Worried About Charity fundraiser in the Last Year: Not on file  . Ran Out of Food in the Last Year: Not on file  Transportation Needs:   . Lack of Transportation (Medical): Not on file  . Lack of Transportation (Non-Medical): Not on file  Physical Activity:   . Days of Exercise per Week: Not on file  . Minutes of Exercise per Session: Not on file  Stress:   . Feeling of Stress : Not  on file  Social Connections:   . Frequency of Communication with Friends and Family: Not on file  . Frequency of Social Gatherings with Friends and Family: Not on file  . Attends Religious Services: Not on file  . Active Member of Clubs or Organizations: Not on file  . Attends Archivist Meetings: Not on file  .  Marital Status: Not on file  Intimate Partner Violence:   . Fear of Current or Ex-Partner: Not on file  . Emotionally Abused: Not on file  . Physically Abused: Not on file  . Sexually Abused: Not on file    Family History  Problem Relation Age of Onset  . Diabetes Father   . Hypertension Father   . Kidney disease Mother   . Hypertension Mother   . Cancer Maternal Grandmother   . Hypertension Maternal Grandfather   . Diabetes Paternal Aunt   . Diabetes Paternal Uncle   . Diabetes Paternal Grandfather   . Colon cancer Neg Hx   . Breast cancer Neg Hx     Past Surgical History:  Procedure Laterality Date  . AMPUTATION TOE Left 10/26/2017   Procedure: AMPUTATION PARTIAL RAY LEFT FOOT AND IRRIGATION/DEBRIDEMENT;  Surgeon: Edrick Kins, DPM;  Location: [place];  Service: Podiatry;  Laterality: Left;  AMPUTATION PARTIAL RAY LEFT FOOT AND IRRIGATION/DEBRIDEMENT  . CHOLECYSTECTOMY  01/24/2003   Archie Endo 04/04/2011  . COLONOSCOPY    . INCISION AND DRAINAGE ABSCESS  03/17/2012   "right but; left lower abdoment"/notes 03/17/2012  . LOWER EXTREMITY INTERVENTION N/A 09/10/2017   Procedure: LOWER EXTREMITY INTERVENTION;  Surgeon: Lorretta Harp, MD;  Location: [place] [facility];  Service: Cardiovascular;  Laterality: N/A;  . PERIPHERAL VASCULAR ATHERECTOMY  09/10/2017   Procedure: PERIPHERAL VASCULAR ATHERECTOMY;  Surgeon: Lorretta Harp, MD;  Location: [place] [facility];  Service: Cardiovascular;;  left AT    ROS: Review of Systems Negative except as stated above  PHYSICAL EXAM: BP 116/69   Pulse 65   Resp 16   Ht 6' (1.829 m)   Wt 230 lb 3.2 oz (104.4 kg)    SpO2 99%   BMI 31.22 kg/m   Physical Exam  General appearance - alert, well appearing, and in no distress Mental status - normal mood, behavior, speech, dress, motor activity, and thought processes Neck - supple, no significant adenopathy Chest - clear to auscultation, no wheezes, rales or rhonchi, symmetric air entry Heart - normal rate, regular rhythm, normal S1, S2, no murmurs, rubs, clicks or gallops Extremities -trace lower extremity edema left greater than right   CMP Latest Ref Rng & Units 12/11/2018 12/04/2018 07/19/2018  Glucose 70 - 99 mg/dL 201(H) 345(H) -  BUN 8 - 23 mg/dL 13 13 -  Creatinine 0.44 - 1.00 mg/dL 0.90 0.94 -  Sodium 135 - 145 mmol/L 137 138 -  Potassium 3.5 - 5.1 mmol/L 3.9 4.4 -  Chloride 98 - 111 mmol/L 103 94(L) -  CO2 22 - 32 mmol/L 26 29 -  Calcium 8.9 - 10.3 mg/dL 8.9 9.2 -  Total Protein 6.0 - 8.5 g/dL - - 7.2  Total Bilirubin 0.0 - 1.2 mg/dL - - 0.6  Alkaline Phos 39 - 117 IU/L - - 147(H)  AST 0 - 40 IU/L - - 8  ALT 0 - 32 IU/L - - 10   Lipid Panel     Component Value Date/Time   CHOL 130 07/19/2018 1009   TRIG 116 07/19/2018 1009   HDL 52 07/19/2018 1009   CHOLHDL 2.5 07/19/2018 1009   CHOLHDL 3.8 09/19/2016 1452   VLDL 36 (H) 09/19/2016 1452   LDLCALC 55 07/19/2018 1009    CBC    Component Value Date/Time   WBC 4.5 12/11/2018 1352   RBC 4.43 12/11/2018 1352   HGB 12.2 12/11/2018 1352   HGB 12.4 08/28/2018 1424  HCT 38.4 12/11/2018 1352   HCT 36.9 08/28/2018 1424   PLT 249 12/11/2018 1352   PLT 210 08/28/2018 1424   MCV 86.7 12/11/2018 1352   MCV 86 08/28/2018 1424   MCH 27.5 12/11/2018 1352   MCHC 31.8 12/11/2018 1352   RDW 13.2 12/11/2018 1352   RDW 12.9 08/28/2018 1424   LYMPHSABS 1.9 08/28/2018 1424   MONOABS 0.4 05/24/2018 1239   EOSABS 0.2 08/28/2018 1424   BASOSABS 0.0 08/28/2018 1424    ASSESSMENT AND PLAN: 1. Uncontrolled type 2 diabetes mellitus with peripheral neuropathy [facility]) Discussed the importance of  better diabetes control.  Healthy eating habits discussed.  She will continue Lantus insulin 160 units daily.  She was on Metformin in the past and felt that it caused nausea.  However she is willing to give it a try again.  We will put her on Metformin 500 mg twice a day.  She was on Farxiga in the past and had to be taken off of it due to acute renal insufficiency. -She needs follow-up with Dr. Loanne Drilling about her [facility] discount has expired.  She plans to renew - Glucose (CBG) - HgB A1c - CBC - Comprehensive metabolic panel - Lipid panel  2. Essential hypertension Blood pressure surprisingly at goal today.  Since she feels stronger when she takes the spironolactone, I recommend decreasing the dose of this medicine to 12.5 mg daily.  She will continue Norvasc, hydralazine, HCTZ and carvedilol.  DASH diet encouraged - spironolactone (ALDACTONE) 25 MG tablet; Take 0.5 tablets (12.5 mg total) by mouth daily.  Dispense: 30 tablet; Refill: 4  3. Hyperlipidemia associated with type 2 diabetes mellitus ([place]) Continue atorvastatin    Patient was given the opportunity to ask questions.  Patient verbalized understanding of the plan and was able to repeat key elements of the plan.   Orders Placed This Encounter  Procedures  . CBC  . Comprehensive metabolic panel  . Lipid panel  . Glucose (CBG)  . HgB A1c     Requested Prescriptions   Signed Prescriptions Disp Refills  . metFORMIN (GLUCOPHAGE) 500 MG tablet 60 tablet 6    Sig: Take 1 tablet (500 mg total) by mouth 2 (two) times daily with a meal.  . spironolactone (ALDACTONE) 25 MG tablet 30 tablet 4    Sig: Take 0.5 tablets (12.5 mg total) by mouth daily.    Return in about 2 months (around 01/29/2020).  Karle Plumber, MD, FACP

## 2019-12-02 LAB — COMPREHENSIVE METABOLIC PANEL
ALT: 14 IU/L (ref 0–32)
AST: 12 IU/L (ref 0–40)
Albumin/Globulin Ratio: 1.7 (ref 1.2–2.2)
Albumin: 4.1 g/dL (ref 3.8–4.8)
Alkaline Phosphatase: 141 IU/L — ABNORMAL HIGH (ref 39–117)
BUN/Creatinine Ratio: 20 (ref 12–28)
BUN: 19 mg/dL (ref 8–27)
Bilirubin Total: 0.5 mg/dL (ref 0.0–1.2)
CO2: 29 mmol/L (ref 20–29)
Calcium: 9.7 mg/dL (ref 8.7–10.3)
Chloride: 97 mmol/L (ref 96–106)
Creatinine, Ser: 0.96 mg/dL (ref 0.57–1.00)
GFR calc Af Amer: 73 mL/min/{1.73_m2} (ref >59)
GFR calc non Af Amer: 63 mL/min/{1.73_m2} (ref >59)
Globulin, Total: 2.4 g/dL (ref 1.5–4.5)
Glucose: 180 mg/dL — ABNORMAL HIGH (ref 65–99)
Potassium: 4.3 mmol/L (ref 3.5–5.2)
Sodium: 140 mmol/L (ref 134–144)
Total Protein: 6.5 g/dL (ref 6.0–8.5)

## 2019-12-02 LAB — CBC
Hematocrit: 42.2 % (ref 34.0–46.6)
Hemoglobin: 13.8 g/dL (ref 11.1–15.9)
MCH: 27.9 pg (ref 26.6–33.0)
MCHC: 32.7 g/dL (ref 31.5–35.7)
MCV: 85 fL (ref 79–97)
Platelets: 234 10*3/uL (ref 150–450)
RBC: 4.94 x10E6/uL (ref 3.77–5.28)
RDW: 12.4 % (ref 11.7–15.4)
WBC: 5.4 10*3/uL (ref 3.4–10.8)

## 2019-12-02 LAB — LIPID PANEL
Chol/HDL Ratio: 2.2 ratio (ref 0.0–4.4)
Cholesterol, Total: 136 mg/dL (ref 100–199)
HDL: 61 mg/dL (ref >39)
LDL Chol Calc (NIH): 60 mg/dL (ref 0–99)
Triglycerides: 77 mg/dL (ref 0–149)
VLDL Cholesterol Cal: 15 mg/dL (ref 5–40)

## 2019-12-02 MED FILL — ?METFORMIN HCL 500MG TABLET: 500 | 30 days supply | Qty: 60 | Fill #0

## 2019-12-02 MED FILL — SPIRONOLACTONE 25 MG TABLET: 25 | 30 days supply | Qty: 15 | Fill #0

## 2019-12-11 ENCOUNTER — Other Ambulatory Visit: Payer: Self-pay | Admitting: Endocrinology

## 2019-12-11 DIAGNOSIS — IMO0002 Reserved for concepts with insufficient information to code with codable children: Secondary | ICD-10-CM

## 2019-12-11 DIAGNOSIS — E1142 Type 2 diabetes mellitus with diabetic polyneuropathy: Secondary | ICD-10-CM

## 2019-12-12 ENCOUNTER — Telehealth: Payer: Self-pay

## 2019-12-12 NOTE — Telephone Encounter (Signed)
Contacted pt to go over lab results pt is aware and doesn't have any questions or concerns 

## 2019-12-16 ENCOUNTER — Ambulatory Visit: Payer: Self-pay | Attending: Internal Medicine

## 2019-12-16 ENCOUNTER — Other Ambulatory Visit: Payer: Self-pay

## 2019-12-18 ENCOUNTER — Other Ambulatory Visit: Payer: Self-pay

## 2019-12-22 ENCOUNTER — Other Ambulatory Visit: Payer: Self-pay

## 2019-12-22 ENCOUNTER — Encounter: Payer: Self-pay | Admitting: Endocrinology

## 2019-12-22 ENCOUNTER — Ambulatory Visit (INDEPENDENT_AMBULATORY_CARE_PROVIDER_SITE_OTHER): Payer: Self-pay | Admitting: Endocrinology

## 2019-12-22 DIAGNOSIS — E1051 Type 1 diabetes mellitus with diabetic peripheral angiopathy without gangrene: Secondary | ICD-10-CM

## 2019-12-22 DIAGNOSIS — E119 Type 2 diabetes mellitus without complications: Secondary | ICD-10-CM | POA: Insufficient documentation

## 2019-12-22 MED ORDER — TOUJEO MAX SOLOSTAR 300 UNIT/ML ~~LOC~~ SOPN: 160.0000 [IU] | PEN_INJECTOR | SUBCUTANEOUS | 11 refills | Status: DC

## 2019-12-22 NOTE — Progress Notes (Signed)
Subjective:    Patient ID: Katherine [name], female    DOB: November 30, 1955, 64 y.o.   MRN: [id number]  HPI Pt returns for f/u of diabetes mellitus:  DM type: 1 Dx'ed: [id number] Complications: polyneuropathy, gastroparesis, foot ulcer, renal insuff, PAD, and DR.  Therapy: insulin since 2014 GDM: never DKA: once (2016) Severe hypoglycemia: never.   Pancreatitis: never.   Pancreatic imaging: normal on 2016 CT.   SDOH: she was ref to CDE to address injection technique, but she did not go; she gets insulin through [facility]; Other: she declines multiple daily injections; she did not tolerate levemir (rash); she was changed to NPH, then to 70/30, due to the pattern of her cbg's.  However, she did not do well on this, so she was changed back to lantus.   Interval history: no cbg record, but states cbg's vary from 150-260.  Pt says she is not recently having trouble injecting insulin.  pt states she feels well in general.  Pt says she is missing insulin doses.    Past Medical History:  Diagnosis Date  . Anemia    requiring transfusion  . Boil of buttock ~ 2016  . Depression   . Diabetic neuropathy ([place])    Archie Endo 08/23/2017  . DKA (diabetic ketoacidoses) ([place])    recent/notes 01/06/2015  . Hyperlipidemia   . Hypertension   . Neuropathy   . Peripheral vascular disease ([place])   . Toe ulcer ([place])    left great toe/notes 08/23/2017  . Type II diabetes mellitus ([place])     Past Surgical History:  Procedure Laterality Date  . AMPUTATION TOE Left 10/26/2017   Procedure: AMPUTATION PARTIAL RAY LEFT FOOT AND IRRIGATION/DEBRIDEMENT;  Surgeon: Edrick Kins, DPM;  Location: [place];  Service: Podiatry;  Laterality: Left;  AMPUTATION PARTIAL RAY LEFT FOOT AND IRRIGATION/DEBRIDEMENT  . CHOLECYSTECTOMY  01/24/2003   Archie Endo 04/04/2011  . COLONOSCOPY    . INCISION AND DRAINAGE ABSCESS  03/17/2012   "right but; left lower abdoment"/notes 03/17/2012  . LOWER EXTREMITY INTERVENTION N/A 09/10/2017   Procedure: LOWER  EXTREMITY INTERVENTION;  Surgeon: Lorretta Harp, MD;  Location: [place] [facility];  Service: Cardiovascular;  Laterality: N/A;  . PERIPHERAL VASCULAR ATHERECTOMY  09/10/2017   Procedure: PERIPHERAL VASCULAR ATHERECTOMY;  Surgeon: Lorretta Harp, MD;  Location: [place] [facility];  Service: Cardiovascular;;  left AT    Social History   Socioeconomic History  . Marital status: Married    Spouse name: Not on file  . Number of children: Not on file  . Years of education: Not on file  . Highest education level: Not on file  Occupational History  . Not on file  Tobacco Use  . Smoking status: Never Smoker  . Smokeless tobacco: Never Used  Substance and Sexual Activity  . Alcohol use: No  . Drug use: No  . Sexual activity: Never  Other Topics Concern  . Not on file  Social History Narrative  . Not on file   Social Determinants of Health   Financial Resource Strain:   . Difficulty of Paying Living Expenses: Not on file  Food Insecurity:   . Worried About Charity fundraiser in the Last Year: Not on file  . Ran Out of Food in the Last Year: Not on file  Transportation Needs:   . Lack of Transportation (Medical): Not on file  . Lack of Transportation (Non-Medical): Not on file  Physical Activity:   . Days of Exercise  per Week: Not on file  . Minutes of Exercise per Session: Not on file  Stress:   . Feeling of Stress : Not on file  Social Connections:   . Frequency of Communication with Friends and Family: Not on file  . Frequency of Social Gatherings with Friends and Family: Not on file  . Attends Religious Services: Not on file  . Active Member of Clubs or Organizations: Not on file  . Attends Archivist Meetings: Not on file  . Marital Status: Not on file  Intimate Partner Violence:   . Fear of Current or Ex-Partner: Not on file  . Emotionally Abused: Not on file  . Physically Abused: Not on file  . Sexually Abused: Not on file    Current Outpatient  Medications on File Prior to Visit  Medication Sig Dispense Refill  . amLODipine (NORVASC) 10 MG tablet Take 1 tablet (10 mg total) by mouth daily. 90 tablet 3  . atorvastatin (LIPITOR) 80 MG tablet Take 1 tablet (80 mg total) by mouth daily at 6 PM. 30 tablet 6  . carvedilol (COREG) 6.25 MG tablet Take 1 tablet (6.25 mg total) by mouth 2 (two) times daily with a meal. 60 tablet 3  . clopidogrel (PLAVIX) 75 MG tablet TAKE 1 TABLET BY MOUTH DAILY WITH BREAKFAST. 90 tablet 1  . fluticasone (FLONASE) 50 MCG/ACT nasal spray Place 1 spray into both nostrils daily. 16 g 0  . gabapentin (NEURONTIN) 300 MG capsule Take 3 capsules (900 mg total) by mouth 2 (two) times daily. 180 capsule 5  . glucose blood (TRUE METRIX BLOOD GLUCOSE TEST) test strip Use as instructed 100 each 12  . hydrALAZINE (APRESOLINE) 100 MG tablet Take 1 tablet (100 mg total) by mouth 3 (three) times daily. 90 tablet 2  . hydrochlorothiazide (HYDRODIURIL) 25 MG tablet TAKE 1 TABLET (25 MG TOTAL) BY MOUTH DAILY. 30 tablet 2  . loratadine (CLARITIN) 10 MG tablet Take 1 tablet (10 mg total) by mouth daily. 30 tablet 11  . metFORMIN (GLUCOPHAGE) 500 MG tablet Take 1 tablet (500 mg total) by mouth 2 (two) times daily with a meal. 60 tablet 6  . metoCLOPramide (REGLAN) 5 MG tablet Take 1 tablet (5 mg total) by mouth 2 (two) times daily as needed for nausea. 60 tablet 1  . Multiple Vitamins-Minerals (MULTIVITAMIN ADULT) CHEW Chew 1 tablet by mouth daily.    Marland Kitchen spironolactone (ALDACTONE) 25 MG tablet Take 0.5 tablets (12.5 mg total) by mouth daily. 30 tablet 4  . triamcinolone cream (KENALOG) 0.1 % Apply 1 application topically 2 (two) times daily. 30 g 0  . TRUEPLUS PEN NEEDLES 31G X 5 MM MISC See admin instructions.  11  . Vitamin D, Ergocalciferol, (DRISDOL) 1.25 MG (50000 UT) CAPS capsule Take 1 capsule (50,000 Units total) by mouth every 7 (seven) days. 16 capsule 0   No current facility-administered medications on file prior to visit.      Allergies  Allergen Reactions  . Other Swelling    Seaweed= swelling on arms, hands and face  . Metformin And Related Nausea Only    Family History  Problem Relation Age of Onset  . Diabetes Father   . Hypertension Father   . Kidney disease Mother   . Hypertension Mother   . Cancer Maternal Grandmother   . Hypertension Maternal Grandfather   . Diabetes Paternal Aunt   . Diabetes Paternal Uncle   . Diabetes Paternal Grandfather   . Colon cancer Neg Hx   .  Breast cancer Neg Hx     BP (!) 180/72 (BP Location: Left Arm, Patient Position: Sitting, Cuff Size: Large)   Pulse 78   Ht 6' (1.829 m)   Wt 239 lb (108.4 kg)   SpO2 94%   BMI 32.41 kg/m    Review of Systems She denies hypoglycemia    Objective:   Physical Exam VITAL SIGNS:  See vs page GENERAL: no distress Pulses: dorsalis pedis intact bilat.   MSK: no deformity of the feet, except the left great toe is surgically absent.  CV: 1+ bilat leg edema. Skin:  no ulcer on the feet.  normal color and temp on the feet. Neuro: sensation is intact to touch on the feet Ext: there is bilateral onychomycosis of the toenails  Lab Results  Component Value Date   HGBA1C 11.9 (A) 12/01/2019       Assessment & Plan:  HTN: is noted today Type 1 DM, with renal insuff: severe exacerbation Noncompliance with insulin: she may do better with Toujeo, to reduce number of injections.   Patient Instructions  Your blood pressure is high today.  Please see your primary care provider soon, to have it rechecked Please change the insulin to Toujeo (same as before, but concentrated).   check your blood sugar twice a day.  vary the time of day when you check, between before the 3 meals, and at bedtime.  also check if you have symptoms of your blood sugar being too high or too low.  please keep a record of the readings and bring it to your next appointment here (or you can bring the meter itself).  You can write it on any piece of  paper.  please call us sooner if your blood sugar goes below 70, or if you have a lot of readings over 200.   Please come back for a follow-up appointment in 6 weeks.

## 2019-12-22 NOTE — Patient Instructions (Addendum)
Your blood pressure is high today.  Please see your primary care provider soon, to have it rechecked Please change the insulin to Toujeo (same as before, but concentrated).   check your blood sugar twice a day.  vary the time of day when you check, between before the 3 meals, and at bedtime.  also check if you have symptoms of your blood sugar being too high or too low.  please keep a record of the readings and bring it to your next appointment here (or you can bring the meter itself).  You can write it on any piece of paper.  please call us sooner if your blood sugar goes below 70, or if you have a lot of readings over 200.   Please come back for a follow-up appointment in 6 weeks.

## 2019-12-29 ENCOUNTER — Other Ambulatory Visit: Payer: Self-pay

## 2019-12-29 ENCOUNTER — Ambulatory Visit: Payer: Self-pay | Attending: Internal Medicine

## 2019-12-29 DIAGNOSIS — Z111 Encounter for screening for respiratory tuberculosis: Secondary | ICD-10-CM

## 2019-12-29 NOTE — Progress Notes (Signed)
PPD Placement note Katherine [name], 64 y.o. female is here today for placement of PPD test Reason for PPD test: annual screening Pt taken PPD test before: yes Verified in allergy area and with patient that they are not allergic to the products PPD is made of (Phenol or Tween).  None verbalized Is patient taking any oral or IV steroid medication now or have they taken it in the last month? no Has the patient ever received the BCG vaccine?: no  P:  PPD placed on 12/29/2019.  Patient advised to return for reading within 48-72 hours. Verbalized understanding

## 2019-12-30 ENCOUNTER — Other Ambulatory Visit: Payer: Self-pay | Admitting: Internal Medicine

## 2019-12-30 DIAGNOSIS — I10 Essential (primary) hypertension: Secondary | ICD-10-CM

## 2019-12-30 MED FILL — ?HYDROCHLOROTHIAZIDE 25MG T: 25 | 30 days supply | Qty: 30 | Fill #0

## 2019-12-30 MED FILL — CLOPIDOGREL 75 MG TABLET: 75 | 30 days supply | Qty: 30 | Fill #4

## 2019-12-31 ENCOUNTER — Other Ambulatory Visit: Payer: Self-pay

## 2019-12-31 ENCOUNTER — Ambulatory Visit: Payer: Self-pay | Attending: Internal Medicine

## 2019-12-31 ENCOUNTER — Other Ambulatory Visit: Payer: Self-pay | Admitting: Endocrinology

## 2019-12-31 DIAGNOSIS — IMO0002 Reserved for concepts with insufficient information to code with codable children: Secondary | ICD-10-CM

## 2019-12-31 DIAGNOSIS — Z111 Encounter for screening for respiratory tuberculosis: Secondary | ICD-10-CM

## 2019-12-31 DIAGNOSIS — E1142 Type 2 diabetes mellitus with diabetic polyneuropathy: Secondary | ICD-10-CM

## 2019-12-31 LAB — TB SKIN TEST
Induration: 0 mm
TB Skin Test: NEGATIVE

## 2019-12-31 NOTE — Progress Notes (Signed)
Pt is here for PPD reading/  Test Read Negative/ 0 induration Letter given to the patient

## 2020-01-07 ENCOUNTER — Other Ambulatory Visit: Payer: Self-pay | Admitting: Endocrinology

## 2020-01-07 ENCOUNTER — Other Ambulatory Visit: Payer: Self-pay

## 2020-01-07 DIAGNOSIS — E1051 Type 1 diabetes mellitus with diabetic peripheral angiopathy without gangrene: Secondary | ICD-10-CM

## 2020-01-07 MED ORDER — TOUJEO SOLOSTAR 300 UNIT/ML ~~LOC~~ SOPN: 160.0000 [IU] | PEN_INJECTOR | SUBCUTANEOUS | 2 refills | Status: DC

## 2020-01-07 MED FILL — !toujeo SOLOSTAR 300 UNIT/M: 300 | 33 days supply | Qty: 18 | Fill #0

## 2020-01-07 NOTE — Telephone Encounter (Signed)
Ok.  Same dosage 

## 2020-01-07 NOTE — Telephone Encounter (Signed)
To pharmacy: we do not fill toujeo max, can you please send new RX for the regular toujeo?    Last ordered: 2 weeks ago by Renato Shin, MD Last refill: 01/01/2020   Rx #: [id number]     To be filled at: [place], [place] [address]    Please advise

## 2020-01-22 ENCOUNTER — Ambulatory Visit (INDEPENDENT_AMBULATORY_CARE_PROVIDER_SITE_OTHER): Payer: Self-pay | Admitting: Cardiovascular Disease

## 2020-01-22 ENCOUNTER — Encounter: Payer: Self-pay | Admitting: Cardiovascular Disease

## 2020-01-22 ENCOUNTER — Other Ambulatory Visit: Payer: Self-pay

## 2020-01-22 VITALS — BP 152/90 | HR 69 | Ht 72.0 in | Wt 225.0 lb

## 2020-01-22 DIAGNOSIS — I70229 Atherosclerosis of native arteries of extremities with rest pain, unspecified extremity: Secondary | ICD-10-CM

## 2020-01-22 DIAGNOSIS — I10 Essential (primary) hypertension: Secondary | ICD-10-CM

## 2020-01-22 DIAGNOSIS — I998 Other disorder of circulatory system: Secondary | ICD-10-CM

## 2020-01-22 DIAGNOSIS — E785 Hyperlipidemia, unspecified: Secondary | ICD-10-CM

## 2020-01-22 NOTE — Assessment & Plan Note (Addendum)
History of dyslipidemia on high-dose statin therapy with lipid profile performed 12/01/2019 revealing total cholesterol 136, LDL 60 and HDL 61.

## 2020-01-22 NOTE — Assessment & Plan Note (Signed)
History of essential hypertension with blood pressure measured today 152/90.  She is on amlodipine, hydralazine, HydroDIURIL and Aldactone.  She may benefit from the addition of an ACE inhibitor especially given her diabetes.  I will defer to her primary care physician.  Recent renal Doppler study did not show evidence of renal artery stenosis.

## 2020-01-22 NOTE — Patient Instructions (Signed)
Medication Instructions:  Continue current medications  *If you need a refill on your cardiac medications before your next appointment, please call your pharmacy*   Lab Work: None Ordered  Testing/Procedures: Your physician has requested that you have a lower  extremity arterial duplex. This test is an ultrasound of the arteries in the legs or arms. It looks at arterial blood flow in the legs and arms. Allow one hour for Lower and Upper Arterial scans. There are no restrictions or special instructions  Your physician has requested that you have an ankle brachial index (ABI). During this test an ultrasound and blood pressure cuff are used to evaluate the arteries that supply the arms and legs with blood. Allow thirty minutes for this exam. There are no restrictions or special instructions.  Follow-Up: At [facility], you and your health needs are our priority.  As part of our continuing mission to provide you with exceptional heart care, we have created designated Provider Care Teams.  These Care Teams include your primary Cardiologist (physician) and Advanced Practice Providers (APPs -  Physician Assistants and Nurse Practitioners) who all work together to provide you with the care you need, when you need it.  We recommend signing up for the patient portal called "MyChart".  Sign up information is provided on this After Visit Summary.  MyChart is used to connect with patients for Virtual Visits (Telemedicine).  Patients are able to view lab/test results, encounter notes, upcoming appointments, etc.  Non-urgent messages can be sent to your provider as well.   To learn more about what you can do with MyChart, go to [email or web address].    Your next appointment:   1 year(s)  The format for your next appointment:   In Person  Provider:   You may see Quay Burow, MD or one of the following Advanced Practice Providers on your designated Care Team:    Kerin Ransom, PA-C  [place], [place]  Coletta Memos, [place]

## 2020-01-22 NOTE — Assessment & Plan Note (Signed)
History of critical limb ischemia status post left first toe ray resection by Dr. Amalia Hailey 10/26/2017 after I performed recanalization of an occluded left anterior tibial artery 09/10/2017.  Her wound has healed nicely.  Her most recent Doppler studies performed 12/02/2018 revealed normal ABIs bilaterally with a widely patent left anterior tibial artery.  We will repeat lower extremity arterial Doppler studies.

## 2020-01-22 NOTE — Progress Notes (Signed)
01/22/2020 Katherine [name]   [birth year]  RJ:[id number]  Primary Physician Ladell Pier, MD Primary Cardiologist: Lorretta Harp MD Lu[place] Car[place], [place]  HPI:  Katherine [name] is a 64 y.o.  married African-American female with no children who worked as a Scientist, water quality. She was referred to me by Dr. Amalia Hailey for peripheral vascular evaluation because of critical limb ischemia.I last saw her in the  07/19/2018.She does have a history of treated hypertension, diabetes and hyperlipidemia. She has never had a heart attack or stroke. She does have a gangrenous left great toe is an ulcer there for several months having undergone several rounds of antibiotic therapy. Her left toe is painful. She had recent venous Doppler study that did not show evidence of DVT.I perform peripheral angiography of her 09/10/17 revealing an occluded left anterior tibial posterior tibial artery with a patent peroneal. I was able to recanalize the anterior tibial down to the foot. Her hospital course was mildly prolonged because of unexplained anemia requiring transfusion. She did not have a retroperitoneal hematoma by CT. Her subsequent lower extremity arterial Doppler studies performed on 09/24/17 showed an increase in her left ABI from 0.67 up to 1.03.When I saw her last approximately 5 weeks ago she still had some edema with an unchanged ischemic ulcer. She also underwent left first toe partial ray amputation by Dr. Amalia Hailey on 10/26/17.  Since I saw her  in the office a year and a half ago she continues to do well.  Her left great toe ray amputation has healed nicely.  Her most recent Doppler studies performed 12/02/2018 revealed normal ABIs bilaterally with patent left anterior tibial.  She does complain of constant pain which sounds like most likely related to diabetes.  She denies chest pain or shortness of breath.  She did have renal Doppler studies recently that showed no evidence of renal artery stenosis.   No outpatient  medications have been marked as taking for the 01/22/20 encounter (Office Visit) with Lorretta Harp, MD.     Allergies  Allergen Reactions  . Other Swelling    Seaweed= swelling on arms, hands and face  . Metformin And Related Nausea Only    Social History   Socioeconomic History  . Marital status: Married    Spouse name: Not on file  . Number of children: Not on file  . Years of education: Not on file  . Highest education level: Not on file  Occupational History  . Not on file  Tobacco Use  . Smoking status: Never Smoker  . Smokeless tobacco: Never Used  Substance and Sexual Activity  . Alcohol use: No  . Drug use: No  . Sexual activity: Never  Other Topics Concern  . Not on file  Social History Narrative  . Not on file   Social Determinants of Health   Financial Resource Strain:   . Difficulty of Paying Living Expenses: Not on file  Food Insecurity:   . Worried About Charity fundraiser in the Last Year: Not on file  . Ran Out of Food in the Last Year: Not on file  Transportation Needs:   . Lack of Transportation (Medical): Not on file  . Lack of Transportation (Non-Medical): Not on file  Physical Activity:   . Days of Exercise per Week: Not on file  . Minutes of Exercise per Session: Not on file  Stress:   . Feeling of Stress : Not on file  Social Connections:   .  Frequency of Communication with Friends and Family: Not on file  . Frequency of Social Gatherings with Friends and Family: Not on file  . Attends Religious Services: Not on file  . Active Member of Clubs or Organizations: Not on file  . Attends Archivist Meetings: Not on file  . Marital Status: Not on file  Intimate Partner Violence:   . Fear of Current or Ex-Partner: Not on file  . Emotionally Abused: Not on file  . Physically Abused: Not on file  . Sexually Abused: Not on file     Review of Systems: General: negative for chills, fever, night sweats or weight changes.    Cardiovascular: negative for chest pain, dyspnea on exertion, edema, orthopnea, palpitations, paroxysmal nocturnal dyspnea or shortness of breath Dermatological: negative for rash Respiratory: negative for cough or wheezing Urologic: negative for hematuria Abdominal: negative for nausea, vomiting, diarrhea, bright red blood per rectum, melena, or hematemesis Neurologic: negative for visual changes, syncope, or dizziness All other systems reviewed and are otherwise negative except as noted above.    Blood pressure (!) 152/90, pulse 69, height 6' (1.829 m), weight 225 lb (102.1 kg), SpO2 96 %.  General appearance: alert and no distress Neck: no adenopathy, no carotid bruit, no JVD, supple, symmetrical, trachea midline and thyroid not enlarged, symmetric, no tenderness/mass/nodules Lungs: clear to auscultation bilaterally Heart: regular rate and rhythm, S1, S2 normal, no murmur, click, rub or gallop Extremities: extremities normal, atraumatic, no cyanosis or edema Pulses: 2+ and symmetric Skin: Skin color, texture, turgor normal. No rashes or lesions Neurologic: Alert and oriented X 3, normal strength and tone. Normal symmetric reflexes. Normal coordination and gait  EKG sinus rhythm at 69 with nonspecific ST and T wave changes.  I personally reviewed this EKG.  ASSESSMENT AND PLAN:   Essential hypertension History of essential hypertension with blood pressure measured today 152/90.  She is on amlodipine, hydralazine, HydroDIURIL and Aldactone.  She may benefit from the addition of an ACE inhibitor especially given her diabetes.  I will defer to her primary care physician.  Recent renal Doppler study did not show evidence of renal artery stenosis.  Dyslipidemia History of dyslipidemia on high-dose statin therapy with lipid profile performed 12/01/2019 revealing total cholesterol 136, LDL 60 and HDL 61.  Critical lower limb ischemia History of critical limb ischemia status post left first  toe ray resection by Dr. Amalia Hailey 10/26/2017 after I performed recanalization of an occluded left anterior tibial artery 09/10/2017.  Her wound has healed nicely.  Her most recent Doppler studies performed 12/02/2018 revealed normal ABIs bilaterally with a widely patent left anterior tibial artery.  We will repeat lower extremity arterial Doppler studies.      Lorretta Harp MD [place], [facility] 01/22/2020 4:38 PM

## 2020-01-27 ENCOUNTER — Ambulatory Visit (HOSPITAL_COMMUNITY)
Admission: RE | Admit: 2020-01-27 | Payer: Self-pay | Source: Ambulatory Visit | Attending: Cardiovascular Disease | Admitting: Cardiovascular Disease

## 2020-02-02 ENCOUNTER — Other Ambulatory Visit: Payer: Self-pay

## 2020-02-02 ENCOUNTER — Ambulatory Visit: Payer: Self-pay | Attending: Internal Medicine | Admitting: Internal Medicine

## 2020-02-02 DIAGNOSIS — E1142 Type 2 diabetes mellitus with diabetic polyneuropathy: Secondary | ICD-10-CM

## 2020-02-02 DIAGNOSIS — H9192 Unspecified hearing loss, left ear: Secondary | ICD-10-CM

## 2020-02-02 DIAGNOSIS — E1143 Type 2 diabetes mellitus with diabetic autonomic (poly)neuropathy: Secondary | ICD-10-CM

## 2020-02-02 DIAGNOSIS — K3184 Gastroparesis: Secondary | ICD-10-CM

## 2020-02-02 DIAGNOSIS — I10 Essential (primary) hypertension: Secondary | ICD-10-CM

## 2020-02-02 DIAGNOSIS — IMO0002 Reserved for concepts with insufficient information to code with codable children: Secondary | ICD-10-CM

## 2020-02-02 DIAGNOSIS — E1165 Type 2 diabetes mellitus with hyperglycemia: Secondary | ICD-10-CM

## 2020-02-02 MED ORDER — METOCLOPRAMIDE HCL 5 MG PO TABS: 5.0000 mg | ORAL_TABLET | Freq: Two times a day (BID) | ORAL | 1 refills | Status: DC | PRN

## 2020-02-02 MED ORDER — AMLODIPINE BESYLATE 10 MG PO TABS: 10.0000 mg | ORAL_TABLET | Freq: Every day | ORAL | 3 refills | Status: DC

## 2020-02-02 MED ORDER — CARVEDILOL 6.25 MG PO TABS: 6.2500 mg | ORAL_TABLET | Freq: Two times a day (BID) | ORAL | 3 refills | Status: DC

## 2020-02-02 MED ORDER — HYDRALAZINE HCL 100 MG PO TABS: 100.0000 mg | ORAL_TABLET | Freq: Three times a day (TID) | ORAL | 2 refills | Status: DC

## 2020-02-02 MED FILL — ?CARVEDILOL 6.25 MG TABLET: 6.25 | 30 days supply | Qty: 60 | Fill #0

## 2020-02-02 MED FILL — SPIRONOLACTONE 25 MG TABLET: 25 | 30 days supply | Qty: 15 | Fill #1

## 2020-02-02 MED FILL — METOCLOPRAMIDE 5 MG TABLET: 5 | 30 days supply | Qty: 60 | Fill #0

## 2020-02-02 MED FILL — hydrALAZINE HCL 100 MG TABS: 100 | 30 days supply | Qty: 90 | Fill #0

## 2020-02-02 MED FILL — ?HYDROCHLOROTHIAZIDE 25MG T: 25 | 30 days supply | Qty: 30 | Fill #1

## 2020-02-02 MED FILL — AMLODIPINE BESYLATE 10 MG T: 10 | 30 days supply | Qty: 30 | Fill #0

## 2020-02-02 MED FILL — GABAPENTIN 300 MG CAPSULE: 300 | 30 days supply | Qty: 180 | Fill #0

## 2020-02-02 MED FILL — ?METFORMIN HCL 500MG TABLET: 500 | 30 days supply | Qty: 60 | Fill #1

## 2020-02-02 NOTE — Progress Notes (Signed)
Virtual Visit via Telephone Note Due to current restrictions/limitations of in-office visits due to the COVID-19 pandemic, this scheduled clinical appointment was converted to a telehealth visit  I connected with Katherine [name] on 02/02/20 at 12:23 p.m by telephone and verified that I am speaking with the correct person using two identifiers. I am in my office.  The patient is at home.  Only the patient and myself participated in this encounter.  I discussed the limitations, risks, security and privacy concerns of performing an evaluation and management service by telephone and the availability of in person appointments. I also discussed with the patient that there may be a patient responsible charge related to this service. The patient expressed understanding and agreed to proceed.   History of Present Illness: Hx of HTN, DM with neuropathy, retinopathyand possible gastroparesis, HL, depression, GERD, PAD, s/p amputation LT 1st toe, BL RAS 59%. Last seen 11/2019  DIABETES TYPE 2 Last A1C:   Results for orders placed or performed in visit on 12/29/19  PPD  Result Value Ref Range   TB Skin Test Negative    Induration 0 mm    Lab Results  Component Value Date   HGBA1C 11.9 (A) 12/01/2019   Saw Dr. Loanne Drilling 12/2019.  Lantus changed to [place].  Just got it in from pharmacy 2 wks ago because it had to be ordered.  Has a f/u appt with Loanne Drilling coming up soon Med Adherence:  [x]  Yes    []  No Medication side effects:  []  Yes    [x]  No Home Monitoring?  [x]  Yes  But sometimes she is not able to get blood out of the finger.  Around 190 Home glucose results range: Diet Adherence: [x]  Yes.  She feels she is doing better.  Eating less candy. wants to lose wgh.  Has loss 10 lbs so far Exercise: []  Yes    [x]  No Hypoglycemic episodes?: []  Yes    []  No Numbness of the feet? [x]  Yes and pain in feet.  Taking Gabapentin with Naprosyn 220mg  at nights Retinopathy hx? [x]  Yes    []  No Last eye exam: over  due for eye exam Comments:  C/o chronic decrease hearing LT>RT.  Would like to see ENT for eval.. Voice heavy in the last 2 yrs  HTN: SBP still elev. This a.m BP was 149/95.  SBP stays in the 140s. Only taking Coreg once a day.  Did not realize it is BID dosing.  Compliant with other meds Some dizziness when she takes all of her meds together in the mornings so she space them out. Limits salt in foods   Taking several vitamins including Biotin, Chromium 1000 mg daily, Vit B12 500 mcg and  Centrium Silver.   Outpatient Encounter Medications as of 02/02/2020  Medication Sig  . amLODipine (NORVASC) 10 MG tablet Take 1 tablet (10 mg total) by mouth daily.  Marland Kitchen atorvastatin (LIPITOR) 80 MG tablet Take 1 tablet (80 mg total) by mouth daily at 6 PM.  . carvedilol (COREG) 6.25 MG tablet Take 1 tablet (6.25 mg total) by mouth 2 (two) times daily with a meal.  . clopidogrel (PLAVIX) 75 MG tablet TAKE 1 TABLET BY MOUTH DAILY WITH BREAKFAST.  . fluticasone (FLONASE) 50 MCG/ACT nasal spray Place 1 spray into both nostrils daily.  Marland Kitchen gabapentin (NEURONTIN) 300 MG capsule Take 3 capsules (900 mg total) by mouth 2 (two) times daily.  Marland Kitchen glucose blood (TRUE METRIX BLOOD GLUCOSE TEST) test strip Use as  instructed  . hydrALAZINE (APRESOLINE) 100 MG tablet Take 1 tablet (100 mg total) by mouth 3 (three) times daily.  . hydrochlorothiazide (HYDRODIURIL) 25 MG tablet TAKE 1 TABLET (25 MG TOTAL) BY MOUTH DAILY.  Marland Kitchen Insulin Glargine, 1 Unit Dial, (TOUJEO SOLOSTAR) 300 UNIT/ML SOPN Inject 160 Units into the skin every morning.  . loratadine (CLARITIN) 10 MG tablet Take 1 tablet (10 mg total) by mouth daily.  . metFORMIN (GLUCOPHAGE) 500 MG tablet Take 1 tablet (500 mg total) by mouth 2 (two) times daily with a meal.  . metoCLOPramide (REGLAN) 5 MG tablet Take 1 tablet (5 mg total) by mouth 2 (two) times daily as needed for nausea.  . Multiple Vitamins-Minerals (MULTIVITAMIN ADULT) CHEW Chew 1 tablet by mouth daily.  Marland Kitchen  spironolactone (ALDACTONE) 25 MG tablet Take 0.5 tablets (12.5 mg total) by mouth daily.  Marland Kitchen triamcinolone cream (KENALOG) 0.1 % Apply 1 application topically 2 (two) times daily.  . TRUEPLUS PEN NEEDLES 31G X 5 MM MISC See admin instructions.  . Vitamin D, Ergocalciferol, (DRISDOL) 1.25 MG (50000 UT) CAPS capsule Take 1 capsule (50,000 Units total) by mouth every 7 (seven) days.   No facility-administered encounter medications on file as of 02/02/2020.      Observations/Objective: Results for orders placed or performed in visit on 12/29/19  PPD  Result Value Ref Range   TB Skin Test Negative    Induration 0 mm     Chemistry      Component Value Date/Time   NA 140 12/01/2019 1119   K 4.3 12/01/2019 1119   CL 97 12/01/2019 1119   CO2 29 12/01/2019 1119   BUN 19 12/01/2019 1119   CREATININE 0.96 12/01/2019 1119   CREATININE 0.88 09/29/2015 1712      Component Value Date/Time   CALCIUM 9.7 12/01/2019 1119   ALKPHOS 141 (H) 12/01/2019 1119   AST 12 12/01/2019 1119   ALT 14 12/01/2019 1119   BILITOT 0.5 12/01/2019 1119     Lab Results  Component Value Date   WBC 5.4 12/01/2019   HGB 13.8 12/01/2019   HCT 42.2 12/01/2019   MCV 85 12/01/2019   PLT 234 12/01/2019     Assessment and Plan: 1. Uncontrolled type 2 diabetes mellitus with peripheral neuropathy (HCC) Dietary counseling given.  Commended her on changes made so far. F/u with endocrine for med management - Ambulatory referral to Ophthalmology  2. Essential hypertension Not at goal Continue current meds. Advise that Coreg is BID dosing - amLODipine (NORVASC) 10 MG tablet; Take 1 tablet (10 mg total) by mouth daily.  Dispense: 90 tablet; Refill: 3 - carvedilol (COREG) 6.25 MG tablet; Take 1 tablet (6.25 mg total) by mouth 2 (two) times daily with a meal.  Dispense: 60 tablet; Refill: 3  3. Gastroparesis due to DM (HCC) Requested RF on Reglan - metoCLOPramide (REGLAN) 5 MG tablet; Take 1 tablet (5 mg total) by  mouth 2 (two) times daily as needed for nausea.  Dispense: 60 tablet; Refill: 1  4. Decreased hearing of left ear - Ambulatory referral to ENT   Follow Up Instructions: F/u in 2 mths in person   I discussed the assessment and treatment plan with the patient. The patient was provided an opportunity to ask questions and all were answered. The patient agreed with the plan and demonstrated an understanding of the instructions.   The patient was advised to call back or seek an in-person evaluation if the symptoms worsen or if the condition  fails to improve as anticipated.  I provided 19 minutes of non-face-to-face time during this encounter.   Karle Plumber, MD

## 2020-02-02 NOTE — Progress Notes (Signed)
Pt states she has some otc medicine that the doctor would like her opinion on   Naproxen sodium 220mg  bid viotin 500mg  once daily Chromium picolinate 1000mg  once daily  b12 500mg   Central senior once daily

## 2020-02-04 ENCOUNTER — Ambulatory Visit: Payer: Self-pay | Admitting: Endocrinology

## 2020-02-06 ENCOUNTER — Other Ambulatory Visit: Payer: Self-pay

## 2020-02-06 ENCOUNTER — Ambulatory Visit (HOSPITAL_COMMUNITY)
Admission: RE | Admit: 2020-02-06 | Discharge: 2020-02-06 | Disposition: A | Discharge status: Home / Self Care | Payer: Self-pay | Source: Ambulatory Visit | Attending: Cardiology | Admitting: Cardiology

## 2020-02-06 DIAGNOSIS — I70229 Atherosclerosis of native arteries of extremities with rest pain, unspecified extremity: Secondary | ICD-10-CM

## 2020-02-06 DIAGNOSIS — I998 Other disorder of circulatory system: Secondary | ICD-10-CM | POA: Insufficient documentation

## 2020-03-25 ENCOUNTER — Other Ambulatory Visit (HOSPITAL_COMMUNITY): Payer: Self-pay | Admitting: Cardiovascular Disease

## 2020-03-25 DIAGNOSIS — Z9862 Peripheral vascular angioplasty status: Secondary | ICD-10-CM

## 2020-03-25 DIAGNOSIS — I739 Peripheral vascular disease, unspecified: Secondary | ICD-10-CM

## 2020-03-31 ENCOUNTER — Ambulatory Visit: Payer: No Typology Code available for payment source | Admitting: Podiatry

## 2020-04-09 ENCOUNTER — Ambulatory Visit: Payer: Self-pay | Attending: Internal Medicine | Admitting: Internal Medicine

## 2020-04-09 ENCOUNTER — Other Ambulatory Visit: Payer: Self-pay

## 2020-04-09 ENCOUNTER — Ambulatory Visit (HOSPITAL_BASED_OUTPATIENT_CLINIC_OR_DEPARTMENT_OTHER): Payer: Self-pay | Admitting: Pharmacist

## 2020-04-09 ENCOUNTER — Encounter: Payer: Self-pay | Admitting: Internal Medicine

## 2020-04-09 VITALS — BP 195/77 | HR 69 | Temp 97.7°F | Resp 16 | Wt 224.4 lb

## 2020-04-09 DIAGNOSIS — Z1231 Encounter for screening mammogram for malignant neoplasm of breast: Secondary | ICD-10-CM

## 2020-04-09 DIAGNOSIS — Z6830 Body mass index (BMI) 30.0-30.9, adult: Secondary | ICD-10-CM

## 2020-04-09 DIAGNOSIS — L602 Onychogryphosis: Secondary | ICD-10-CM

## 2020-04-09 DIAGNOSIS — I10 Essential (primary) hypertension: Secondary | ICD-10-CM

## 2020-04-09 DIAGNOSIS — E6609 Other obesity due to excess calories: Secondary | ICD-10-CM

## 2020-04-09 DIAGNOSIS — E1142 Type 2 diabetes mellitus with diabetic polyneuropathy: Secondary | ICD-10-CM

## 2020-04-09 DIAGNOSIS — IMO0002 Reserved for concepts with insufficient information to code with codable children: Secondary | ICD-10-CM

## 2020-04-09 DIAGNOSIS — Z23 Encounter for immunization: Secondary | ICD-10-CM

## 2020-04-09 DIAGNOSIS — E1165 Type 2 diabetes mellitus with hyperglycemia: Secondary | ICD-10-CM

## 2020-04-09 LAB — POCT GLYCOSYLATED HEMOGLOBIN (HGB A1C): HbA1c, POC (controlled diabetic range): 9.6 % — AB (ref 0.0–7.0)

## 2020-04-09 LAB — GLUCOSE, POCT (MANUAL RESULT ENTRY): POC Glucose: 234 mg/dl — AB (ref 70–99)

## 2020-04-09 NOTE — Patient Instructions (Addendum)
Your blood pressure is not at goal.  Please take the carvedilol twice a day as prescribed. Your A1c has decreased by 2 points.  Try taking the Metformin 2 tablets at bedtime. I have referred you for your eye exam and to the podiatrist.  Please call [phone] to schedule your mammogram   Td (Tetanus, Diphtheria) Vaccine: What You Need to Know 1. Why get vaccinated? Td vaccine can prevent tetanus and diphtheria. Tetanus enters the body through cuts or wounds. Diphtheria spreads from person to person.  TETANUS (T) causes painful stiffening of the muscles. Tetanus can lead to serious health problems, including being unable to open the mouth, having trouble swallowing and breathing, or death.  DIPHTHERIA (D) can lead to difficulty breathing, heart failure, paralysis, or death. 2. Td vaccine Td is only for children 7 years and older, adolescents, and adults.  Td is usually given as a booster dose every 10 years, but it can also be given earlier after a severe and dirty wound or burn. Another vaccine, called Tdap, that protects against pertussis, also known as "whooping cough," in addition to tetanus and diphtheria, may be used instead of Td.  Td may be given at the same time as other vaccines. 3. Talk with your health care provider Tell your vaccine provider if the person getting the vaccine:  Has had an allergic reaction after a previous dose of any vaccine that protects against tetanus or diphtheria, or has any severe, life-threatening allergies.  Has ever had Guillain-Barr Syndrome (also called GBS).  Has had severe pain or swelling after a previous dose of any vaccine that protects against tetanus or diphtheria. In some cases, your health care provider may decide to postpone Td vaccination to a future visit.  People with minor illnesses, such as a cold, may be vaccinated. People who are moderately or severely ill should usually wait until they recover before getting Td vaccine.  Your  health care provider can give you more information. 4. Risks of a vaccine reaction  Pain, redness, or swelling where the shot was given, mild fever, headache, feeling tired, and nausea, vomiting, diarrhea, or stomachache sometimes happen after Td vaccine. People sometimes faint after medical procedures, including vaccination. Tell your provider if you feel dizzy or have vision changes or ringing in the ears.  As with any medicine, there is a very remote chance of a vaccine causing a severe allergic reaction, other serious injury, or death. 5. What if there is a serious problem? An allergic reaction could occur after the vaccinated person leaves the clinic. If you see signs of a severe allergic reaction (hives, swelling of the face and throat, difficulty breathing, a fast heartbeat, dizziness, or weakness), call 9-1-1 and get the person to the nearest hospital.  For other signs that concern you, call your health care provider.  Adverse reactions should be reported to the Vaccine Adverse Event Reporting System (VAERS). Your health care provider will usually file this report, or you can do it yourself. Visit the VAERS website at www.vaers.SamedayNews.es or call 216-418-3194. VAERS is only for reporting reactions, and VAERS staff do not give medical advice. 6. The National Vaccine Injury Compensation Program The [employer] Vaccine Injury Compensation Program (VICP) is a federal program that was created to compensate people who may have been injured by certain vaccines. Visit the VICP website at GoldCloset.com.ee or call (832) 183-7737 to learn about the program and about filing a claim. There is a time limit to file a claim for compensation. 7.  How can I learn more?  Ask your health care provider.  Call your local or state health department.  Contact the Centers for Disease Control and Prevention (CDC): ? Call 313-349-7097 (1-800-CDC-INFO) or ? Visit CDC's website at  http://hunter.com/ Vaccine Information Statement Td Vaccine (02/19/19) This information is not intended to replace advice given to you by your health care provider. Make sure you discuss any questions you have with your health care provider. Document Revised: 03/31/2019 Document Reviewed: 03/03/2019 Elsevier Patient Education  [place].

## 2020-04-09 NOTE — Progress Notes (Signed)
Patient presents for vaccination against tetanus per orders of Dr. Johnson. Consent given. Counseling provided. No contraindications exists. Vaccine administered without incident.   

## 2020-04-09 NOTE — Progress Notes (Signed)
Patient ID: Katherine [name], female    DOB: [birth year]  MRN: [id number]  CC: Diabetes and Hypertension   Subjective: Katherine [name] is a 64 y.o. female who presents for chronic ds management Her concerns today include:  Hx of HTN, DM with neuropathy, retinopathyand possible gastroparesis, HL, depression, GERD, PAD, s/p amputation LT 1st toe, BL RAS 59%.  DIABETES TYPE 2 Last A1C:   Results for orders placed or performed in visit on 04/09/20  POCT glucose (manual entry)  Result Value Ref Range   POC Glucose 234 (A) 70 - 99 mg/dl  POCT glycosylated hemoglobin (Hb A1C)  Result Value Ref Range   Hemoglobin A1C     HbA1c POC (<> result, manual entry)     HbA1c, POC (prediabetic range)     HbA1c, POC (controlled diabetic range) 9.6 (A) 0.0 - 7.0 %   A1C down 2 points Med Adherence:  [x]  Yes - compliant with Touejo. Takes metformin once a day because sometimes it causes nausea.  Does not cause nausea if she takes at night    []  No Medication side effects:  [x]  Yes    []  No Home Monitoring?  []  Yes    [x]  No not regularly in past 2 wks Home glucose results range: Diet Adherence: "At times its better."  Decrease appetite Exercise: [x]  Yes  -walking more.  "I use to sit around all day." Hypoglycemic episodes?: []  Yes    [x]  No Numbness of the feet? [x]  Yes    []  No Retinopathy hx? []  Yes    []  No Last eye exam: referred for eye exam on last visit.  No appt.  Has OC.  She contacted [facility] and was told that they take the orange card but she will need a referral from me. Comments:   HYPERTENSION Currently taking: see medication list.  Still taking Coreg once a day.  She forgot that we had discussed this on last visit that she is supposed to be taking it twice a day.  Compliant with her other blood pressure medications Med Adherence: [x]  Yes and took meds already for the morning   []  No Medication side effects: []  Yes    [x]  No Adherence with salt restriction: [x]  Yes    []  No Home  Monitoring?: [x]  Yes occasionally    []  No Monitoring Frequency: []  Yes    []  No Home BP results range: []  Yes    []  No SOB? []  Yes    [x]  No Chest Pain?: []  Yes    [x]  No Leg swelling?: [x]  Yes off and on but not so much today    []  No Headaches?: []  Yes    [x]  No Dizziness? []  Yes    [x]  No Comments:   HM:  Had COVID Moderna series - last shot was 03/08/2020.  She will call us back with the date of the first shot. Patient Active Problem List   Diagnosis Date Noted  . Diabetes ([place]) 12/22/2019  . Renal artery stenosis ([place]) 12/01/2019  . Diabetic polyneuropathy associated with type 2 diabetes mellitus ([place]) 05/14/2018  . Thyroid nodule 10/04/2017  . Microcytic anemia 09/11/2017  . Critical lower limb ischemia 09/10/2017  . Retinopathy 08/22/2017  . Thyroid enlargement 07/12/2017  . Dyslipidemia 09/20/2016  . DEPRESSION 09/10/2009  . LEG CRAMPS 05/19/2009  . GERD 02/15/2009  . Gastroparesis 09/03/2008  . DENTAL CARIES 07/14/2008  . Essential hypertension 09/16/2003     Current Outpatient Medications on File  Prior to Visit  Medication Sig Dispense Refill  . amLODipine (NORVASC) 10 MG tablet Take 1 tablet (10 mg total) by mouth daily. 90 tablet 3  . atorvastatin (LIPITOR) 80 MG tablet Take 1 tablet (80 mg total) by mouth daily at 6 PM. 30 tablet 6  . carvedilol (COREG) 6.25 MG tablet Take 1 tablet (6.25 mg total) by mouth 2 (two) times daily with a meal. 60 tablet 3  . clopidogrel (PLAVIX) 75 MG tablet TAKE 1 TABLET BY MOUTH DAILY WITH BREAKFAST. 90 tablet 1  . fluticasone (FLONASE) 50 MCG/ACT nasal spray Place 1 spray into both nostrils daily. 16 g 0  . gabapentin (NEURONTIN) 300 MG capsule Take 3 capsules (900 mg total) by mouth 2 (two) times daily. 180 capsule 5  . glucose blood (TRUE METRIX BLOOD GLUCOSE TEST) test strip Use as instructed 100 each 12  . hydrALAZINE (APRESOLINE) 100 MG tablet Take 1 tablet (100 mg total) by mouth 3 (three) times daily. 90 tablet 2  .  hydrochlorothiazide (HYDRODIURIL) 25 MG tablet TAKE 1 TABLET (25 MG TOTAL) BY MOUTH DAILY. 30 tablet 2  . Insulin Glargine, 1 Unit Dial, (TOUJEO SOLOSTAR) 300 UNIT/ML SOPN Inject 160 Units into the skin every morning. 48 mL 2  . loratadine (CLARITIN) 10 MG tablet Take 1 tablet (10 mg total) by mouth daily. 30 tablet 11  . metFORMIN (GLUCOPHAGE) 500 MG tablet Take 1 tablet (500 mg total) by mouth 2 (two) times daily with a meal. 60 tablet 6  . metoCLOPramide (REGLAN) 5 MG tablet Take 1 tablet (5 mg total) by mouth 2 (two) times daily as needed for nausea. 60 tablet 1  . Multiple Vitamins-Minerals (MULTIVITAMIN ADULT) CHEW Chew 1 tablet by mouth daily.    Marland Kitchen spironolactone (ALDACTONE) 25 MG tablet Take 0.5 tablets (12.5 mg total) by mouth daily. 30 tablet 4  . triamcinolone cream (KENALOG) 0.1 % Apply 1 application topically 2 (two) times daily. 30 g 0  . TRUEPLUS PEN NEEDLES 31G X 5 MM MISC See admin instructions.  11  . Vitamin D, Ergocalciferol, (DRISDOL) 1.25 MG (50000 UT) CAPS capsule Take 1 capsule (50,000 Units total) by mouth every 7 (seven) days. 16 capsule 0   No current facility-administered medications on file prior to visit.    Allergies  Allergen Reactions  . Other Swelling    Seaweed= swelling on arms, hands and face  . Metformin And Related Nausea Only    Social History   Socioeconomic History  . Marital status: Married    Spouse name: Not on file  . Number of children: Not on file  . Years of education: Not on file  . Highest education level: Not on file  Occupational History  . Not on file  Tobacco Use  . Smoking status: Never Smoker  . Smokeless tobacco: Never Used  Substance and Sexual Activity  . Alcohol use: No  . Drug use: No  . Sexual activity: Never  Other Topics Concern  . Not on file  Social History Narrative  . Not on file   Social Determinants of Health   Financial Resource Strain:   . Difficulty of Paying Living Expenses:   Food Insecurity:     . Worried About Charity fundraiser in the Last Year:   . Arboriculturist in the Last Year:   Transportation Needs:   . Film/video editor (Medical):   Marland Kitchen Lack of Transportation (Non-Medical):   Physical Activity:   . Days of  Exercise per Week:   . Minutes of Exercise per Session:   Stress:   . Feeling of Stress :   Social Connections:   . Frequency of Communication with Friends and Family:   . Frequency of Social Gatherings with Friends and Family:   . Attends Religious Services:   . Active Member of Clubs or Organizations:   . Attends Archivist Meetings:   Marland Kitchen Marital Status:   Intimate Partner Violence:   . Fear of Current or Ex-Partner:   . Emotionally Abused:   Marland Kitchen Physically Abused:   . Sexually Abused:     Family History  Problem Relation Age of Onset  . Diabetes Father   . Hypertension Father   . Kidney disease Mother   . Hypertension Mother   . Cancer Maternal Grandmother   . Hypertension Maternal Grandfather   . Diabetes Paternal Aunt   . Diabetes Paternal Uncle   . Diabetes Paternal Grandfather   . Colon cancer Neg Hx   . Breast cancer Neg Hx     Past Surgical History:  Procedure Laterality Date  . AMPUTATION TOE Left 10/26/2017   Procedure: AMPUTATION PARTIAL RAY LEFT FOOT AND IRRIGATION/DEBRIDEMENT;  Surgeon: Edrick Kins, DPM;  Location: [facility];  Service: Podiatry;  Laterality: Left;  AMPUTATION PARTIAL RAY LEFT FOOT AND IRRIGATION/DEBRIDEMENT  . CHOLECYSTECTOMY  01/24/2003   Archie Endo 04/04/2011  . COLONOSCOPY    . INCISION AND DRAINAGE ABSCESS  03/17/2012   "right but; left lower abdoment"/notes 03/17/2012  . LOWER EXTREMITY INTERVENTION N/A 09/10/2017   Procedure: LOWER EXTREMITY INTERVENTION;  Surgeon: Lorretta Harp, MD;  Location: [facility];  Service: Cardiovascular;  Laterality: N/A;  . PERIPHERAL VASCULAR ATHERECTOMY  09/10/2017   Procedure: PERIPHERAL VASCULAR ATHERECTOMY;  Surgeon: Lorretta Harp, MD;  Location: [facility];  Service: Cardiovascular;;  left AT    ROS: Review of Systems Negative except as stated above  PHYSICAL EXAM: BP (!) 195/77   Pulse 69   Temp 97.7 F (36.5 C)   Resp 16   Wt 224 lb 6.4 oz (101.8 kg)   SpO2 96%   BMI 30.43 kg/m   Wt Readings from Last 3 Encounters:  04/09/20 224 lb 6.4 oz (101.8 kg)  01/22/20 225 lb (102.1 kg)  12/22/19 239 lb (108.4 kg)     Physical Exam General appearance - alert, well appearing, and in no distress Mental status - normal mood, behavior, speech, dress, motor activity, and thought processes Neck - supple, no significant adenopathy Chest - clear to auscultation, no wheezes, rales or rhonchi, symmetric air entry Heart -regular rate rhythm.  2/6 systolic ejection murmur left upper sternal border Extremities -trace bilateral lower extremity edema. Diabetic Foot Exam - Simple   Simple Foot Form Visual Inspection See comments: Yes Sensation Testing See comments: Yes Pulse Check See comments: Yes Comments Patient is flat-footed.  She has had amputation of the left big toe.  Decreased sensation on the plantar surface of both feet.  Dorsalis pedis pulses 2 bilaterally.      CMP Latest Ref Rng & Units 12/01/2019 12/11/2018 12/04/2018  Glucose 65 - 99 mg/dL 180(H) 201(H) 345(H)  BUN 8 - 27 mg/dL 19 13 13   Creatinine 0.57 - 1.00 mg/dL 0.96 0.90 0.94  Sodium 134 - 144 mmol/L 140 137 138  Potassium 3.5 - 5.2 mmol/L 4.3 3.9 4.4  Chloride 96 - 106 mmol/L 97 103 94(L)  CO2 20 - 29 mmol/L 29 26 29  Calcium 8.7 - 10.3 mg/dL 9.7 8.9 9.2  Total Protein 6.0 - 8.5 g/dL 6.5 - -  Total Bilirubin 0.0 - 1.2 mg/dL 0.5 - -  Alkaline Phos 39 - 117 IU/L 141(H) - -  AST 0 - 40 IU/L 12 - -  ALT 0 - 32 IU/L 14 - -   Lipid Panel     Component Value Date/Time   CHOL 136 12/01/2019 1119   TRIG 77 12/01/2019 1119   HDL 61 12/01/2019 1119   CHOLHDL 2.2 12/01/2019 1119   CHOLHDL 3.8 09/19/2016 1452   VLDL 36 (H) 09/19/2016 1452   LDLCALC 60  12/01/2019 1119    CBC    Component Value Date/Time   WBC 5.4 12/01/2019 1119   WBC 4.5 12/11/2018 1352   RBC 4.94 12/01/2019 1119   RBC 4.43 12/11/2018 1352   HGB 13.8 12/01/2019 1119   HCT 42.2 12/01/2019 1119   PLT 234 12/01/2019 1119   MCV 85 12/01/2019 1119   MCH 27.9 12/01/2019 1119   MCH 27.5 12/11/2018 1352   MCHC 32.7 12/01/2019 1119   MCHC 31.8 12/11/2018 1352   RDW 12.4 12/01/2019 1119   LYMPHSABS 1.9 08/28/2018 1424   MONOABS 0.4 05/24/2018 1239   EOSABS 0.2 08/28/2018 1424   BASOSABS 0.0 08/28/2018 1424    ASSESSMENT AND PLAN: 1. Uncontrolled type 2 diabetes mellitus with peripheral neuropathy (HCC) A1c improved but not at goal.  She will continue Toujeo at current dose.  She will try taking the Metformin 500 mg 2 tablets in the evenings since when she takes in the mornings she gets nausea. Discussed on encourage healthy eating habits - POCT glucose (manual entry) - POCT glycosylated hemoglobin (Hb A1C) - Ambulatory referral to Ophthalmology  2. Essential hypertension Not at goal.  Continue current medications including amlodipine, hydralazine, spironolactone, HCTZ and Coreg.  Advised that the Coreg is twice a day dosing.  3. Class 1 obesity due to excess calories with serious comorbidity and body mass index (BMI) of 30.0 to 30.9 in adult See #1 above  4. Encounter for screening mammogram for malignant neoplasm of breast - MM Digital Screening; Future  5. Need for Tdap vaccination Given today  6. Overgrown toenails - Ambulatory referral to Podiatry    Patient was given the opportunity to ask questions.  Patient verbalized understanding of the plan and was able to repeat key elements of the plan.   Orders Placed This Encounter  Procedures  . MM Digital Screening  . Ambulatory referral to Ophthalmology  . Ambulatory referral to Podiatry  . POCT glucose (manual entry)  . POCT glycosylated hemoglobin (Hb A1C)     Requested Prescriptions    No  prescriptions requested or ordered in this encounter    Return in about 3 months (around 07/10/2020).  Karle Plumber, MD, FACP

## 2020-04-20 ENCOUNTER — Other Ambulatory Visit: Payer: Self-pay | Admitting: Cardiovascular Disease

## 2020-04-20 MED FILL — METFORMIN HCL 500 MG TABS: 500 | 30 days supply | Qty: 60 | Fill #1

## 2020-04-20 MED FILL — $TOUJEO SOLOSTAR 300 UNITS/: 300 | 33 days supply | Qty: 18 | Fill #1

## 2020-04-20 MED FILL — HYDROCHLOROTHIAZIDE 25 MG T: 25 | 30 days supply | Qty: 30 | Fill #1

## 2020-04-23 ENCOUNTER — Other Ambulatory Visit: Payer: Self-pay | Admitting: Cardiovascular Disease

## 2020-04-23 MED FILL — CLOPIDOGREL 75 MG TABLET: 75 | 30 days supply | Qty: 30 | Fill #0

## 2020-05-27 ENCOUNTER — Telehealth: Payer: Self-pay

## 2020-05-27 NOTE — Telephone Encounter (Signed)
Contacted pt and was unable to lvm to make pt aware that handicapp application is ready for pick up

## 2020-06-02 ENCOUNTER — Other Ambulatory Visit: Payer: Self-pay

## 2020-06-02 ENCOUNTER — Ambulatory Visit (INDEPENDENT_AMBULATORY_CARE_PROVIDER_SITE_OTHER): Payer: No Typology Code available for payment source

## 2020-06-02 ENCOUNTER — Ambulatory Visit: Payer: No Typology Code available for payment source | Admitting: Podiatry

## 2020-06-02 DIAGNOSIS — M79604 Pain in right leg: Secondary | ICD-10-CM

## 2020-06-02 DIAGNOSIS — R52 Pain, unspecified: Secondary | ICD-10-CM

## 2020-06-02 DIAGNOSIS — M79676 Pain in unspecified toe(s): Secondary | ICD-10-CM

## 2020-06-02 DIAGNOSIS — E0842 Diabetes mellitus due to underlying condition with diabetic polyneuropathy: Secondary | ICD-10-CM

## 2020-06-02 DIAGNOSIS — B351 Tinea unguium: Secondary | ICD-10-CM

## 2020-06-02 DIAGNOSIS — M79605 Pain in left leg: Secondary | ICD-10-CM

## 2020-06-06 MED ORDER — MELOXICAM 7.5 MG PO TABS
7.5000 mg | ORAL_TABLET | Freq: Every day | ORAL | 0 refills | Status: DC
Start: 2020-06-06 — End: 2022-04-01

## 2020-06-06 NOTE — Progress Notes (Signed)
   SUBJECTIVE Patient with a history of diabetes mellitus presents to office today complaining of elongated, thickened nails that cause pain while ambulating in shoes.  She is unable to trim her own nails.  Patient also has history of great toe amputation to the left foot.  Most recently the patient has been complaining of bilateral lower extremity leg pain.  She has been closely followed and monitored by vascular.  She is referred here to ensure that none of her pain is coming from her feet.  Patient is here for further evaluation and treatment.   Past Medical History:  Diagnosis Date  . Anemia    requiring transfusion  . Boil of buttock ~ 2016  . Depression   . Diabetic neuropathy ([place])    Archie Endo 08/23/2017  . DKA (diabetic ketoacidoses) ([place])    recent/notes 01/06/2015  . Hyperlipidemia   . Hypertension   . Neuropathy   . Peripheral vascular disease ([place])   . Toe ulcer ([place])    left great toe/notes 08/23/2017  . Type II diabetes mellitus ([place])     OBJECTIVE General Patient is awake, alert, and oriented x 3 and in no acute distress. Derm Skin is dry and supple bilateral. Negative open lesions or macerations. Remaining integument unremarkable. Nails are tender, long, thickened and dystrophic with subungual debris, consistent with onychomycosis, 1-5 bilateral. No signs of infection noted. Vasc  DP and PT pedal pulses diminished.  Patient is closely monitored by vascular and has been recently fully evaluated.  Neuro Epicritic and protective threshold sensation diminished bilaterally.  Musculoskeletal Exam No symptomatic pedal deformities noted bilateral. Muscular strength within normal limits.  History of left great toe amputation  ASSESSMENT 1. Diabetes Mellitus w/ peripheral neuropathy 2. Onychomycosis of nail due to dermatophyte bilateral 3. Pain in legs bilateral 4.  History of left great toe amputation  PLAN OF CARE 1. Patient evaluated today. 2. Instructed to maintain good  pedal hygiene and foot care. Stressed importance of controlling blood sugar.  3. Mechanical debridement of nails 1-5 bilaterally performed using a nail nipper. Filed with dremel without incident.  4.  Explained to the patient I did not know where her leg pain would be stemming from.  From a foot and ankle standpoint her feet are doing well  5.  Prescription for meloxicam 7.5 mg daily to see if it alleviates some of her leg pain  6.  Return to clinic in 3 mos.     Edrick Kins, DPM [facility]  Dr. Edrick Kins, [address]                                        [place], [place] [address]                Office [phone]  Fax [phone]

## 2020-06-07 ENCOUNTER — Telehealth: Payer: Self-pay | Admitting: Podiatry

## 2020-06-07 MED FILL — MELOXICAM 7.5 MG TABLET: 7.5 | 30 days supply | Qty: 30 | Fill #0

## 2020-06-07 NOTE — Telephone Encounter (Signed)
Called informiong pt that the medication was called in to pt pharmacy

## 2020-07-15 MED FILL — HYDROCHLOROTHIAZIDE 25 MG T: 25 | 30 days supply | Qty: 30 | Fill #2

## 2020-07-15 MED FILL — ?METFORMIN HCL 500MG TABL: 500 | 30 days supply | Qty: 60 | Fill #2

## 2020-07-15 MED FILL — ?CLOPIDOGREL 75MG TA: 75 | 30 days supply | Qty: 30 | Fill #1

## 2020-07-15 MED FILL — $TOUJEO SOLOSTAR 300 UNITS/: 300 | 28 days supply | Qty: 15 | Fill #2

## 2020-09-27 MED FILL — $TOUJEO SOLOSTAR 300 UNITS/: 300 | 5 days supply | Qty: 3 | Fill #3

## 2020-09-27 MED FILL — ?CLOPIDOGREL 75MG TA: 75 | 30 days supply | Qty: 30 | Fill #2

## 2020-10-18 MED FILL — !toujeo SOLOSTAR 300 UNIT/M: 300 | 8 days supply | Qty: 5 | Fill #4

## 2020-11-03 MED FILL — $TOUJEO SOLOSTAR 300 UNITS/: 300 | 30 days supply | Qty: 17 | Fill #4

## 2021-02-03 ENCOUNTER — Telehealth: Payer: Self-pay | Admitting: Internal Medicine

## 2021-02-03 DIAGNOSIS — E1051 Type 1 diabetes mellitus with diabetic peripheral angiopathy without gangrene: Secondary | ICD-10-CM

## 2021-02-03 NOTE — Telephone Encounter (Signed)
Katherine [name] could you follow up with pt

## 2021-02-03 NOTE — Telephone Encounter (Signed)
Copied from [facility] [id number]. Topic: General - Call Back - No Documentation >> Feb 03, 2021  4:15 PM Erick Blinks wrote: [phone]   Pt is requesting a call back from the nurse, she has insulin that has expired. She has two more refills, she is requesting one of the refills. Does not know the name

## 2021-02-04 ENCOUNTER — Ambulatory Visit: Payer: Self-pay | Attending: Internal Medicine

## 2021-02-04 ENCOUNTER — Other Ambulatory Visit: Payer: Self-pay

## 2021-02-04 ENCOUNTER — Other Ambulatory Visit: Payer: Self-pay | Admitting: Endocrinology

## 2021-02-04 ENCOUNTER — Other Ambulatory Visit: Payer: Self-pay | Admitting: Internal Medicine

## 2021-02-04 DIAGNOSIS — E1051 Type 1 diabetes mellitus with diabetic peripheral angiopathy without gangrene: Secondary | ICD-10-CM

## 2021-02-04 DIAGNOSIS — Z111 Encounter for screening for respiratory tuberculosis: Secondary | ICD-10-CM

## 2021-02-04 MED ORDER — TOUJEO SOLOSTAR 300 UNIT/ML ~~LOC~~ SOPN: 160.0000 [IU] | PEN_INJECTOR | SUBCUTANEOUS | 1 refills | Status: DC

## 2021-02-04 NOTE — Progress Notes (Signed)
Tuberculin skin test applied to left ventral forearm. Explained to patient to return within 48-72 hours for reading and paperwork. Patient verbalized understanding.   

## 2021-02-04 NOTE — Telephone Encounter (Signed)
Pt scheduled for May 3rd with Dr. Wynetta Emery. Will send enough Toujeo to last until then.

## 2021-02-04 NOTE — Addendum Note (Signed)
Addended by: Daisy Blossom, Annie Main L on: 02/04/2021 10:29 AM   Modules accepted: Orders

## 2021-02-07 ENCOUNTER — Other Ambulatory Visit: Payer: Self-pay

## 2021-02-07 ENCOUNTER — Ambulatory Visit: Payer: Self-pay

## 2021-02-07 ENCOUNTER — Other Ambulatory Visit: Payer: Self-pay | Admitting: Endocrinology

## 2021-02-07 ENCOUNTER — Encounter: Payer: Self-pay | Admitting: Internal Medicine

## 2021-02-07 DIAGNOSIS — E1051 Type 1 diabetes mellitus with diabetic peripheral angiopathy without gangrene: Secondary | ICD-10-CM

## 2021-02-07 LAB — TB SKIN TEST
Induration: 0 mm
TB Skin Test: NEGATIVE

## 2021-02-07 NOTE — Progress Notes (Signed)
TB negative.

## 2021-02-11 ENCOUNTER — Ambulatory Visit (HOSPITAL_COMMUNITY): Payer: Self-pay

## 2021-02-14 ENCOUNTER — Ambulatory Visit (HOSPITAL_COMMUNITY)
Admission: RE | Admit: 2021-02-14 | Discharge: 2021-02-14 | Disposition: A | Discharge status: Home / Self Care | Payer: Self-pay | Source: Ambulatory Visit | Attending: Cardiology | Admitting: Cardiology

## 2021-02-14 ENCOUNTER — Other Ambulatory Visit: Payer: Self-pay

## 2021-02-14 ENCOUNTER — Other Ambulatory Visit (HOSPITAL_COMMUNITY): Payer: Self-pay | Admitting: Cardiovascular Disease

## 2021-02-14 DIAGNOSIS — I739 Peripheral vascular disease, unspecified: Secondary | ICD-10-CM

## 2021-02-14 DIAGNOSIS — Z9862 Peripheral vascular angioplasty status: Secondary | ICD-10-CM | POA: Insufficient documentation

## 2021-02-23 ENCOUNTER — Other Ambulatory Visit: Payer: Self-pay

## 2021-02-23 ENCOUNTER — Ambulatory Visit (INDEPENDENT_AMBULATORY_CARE_PROVIDER_SITE_OTHER): Payer: Self-pay | Admitting: Cardiovascular Disease

## 2021-02-23 ENCOUNTER — Encounter: Payer: Self-pay | Admitting: Cardiovascular Disease

## 2021-02-23 VITALS — BP 176/80 | Ht 72.0 in | Wt 216.8 lb

## 2021-02-23 DIAGNOSIS — E785 Hyperlipidemia, unspecified: Secondary | ICD-10-CM

## 2021-02-23 DIAGNOSIS — I10 Essential (primary) hypertension: Secondary | ICD-10-CM

## 2021-02-23 DIAGNOSIS — I70229 Atherosclerosis of native arteries of extremities with rest pain, unspecified extremity: Secondary | ICD-10-CM

## 2021-02-23 LAB — LIPID PANEL
Chol/HDL Ratio: 3.1 ratio (ref 0.0–4.4)
Cholesterol, Total: 175 mg/dL (ref 100–199)
HDL: 56 mg/dL (ref >39)
LDL Chol Calc (NIH): 100 mg/dL — ABNORMAL HIGH (ref 0–99)
Triglycerides: 106 mg/dL (ref 0–149)
VLDL Cholesterol Cal: 19 mg/dL (ref 5–40)

## 2021-02-23 LAB — HEPATIC FUNCTION PANEL
ALT: 11 IU/L (ref 0–32)
AST: 13 IU/L (ref 0–40)
Albumin: 4 g/dL (ref 3.8–4.8)
Alkaline Phosphatase: 154 IU/L — ABNORMAL HIGH (ref 44–121)
Bilirubin Total: 0.3 mg/dL (ref 0.0–1.2)
Bilirubin, Direct: 0.12 mg/dL (ref 0.00–0.40)
Total Protein: 7 g/dL (ref 6.0–8.5)

## 2021-02-23 NOTE — Assessment & Plan Note (Signed)
History of dyslipidemia on statin therapy with lipid profile performed 12/01/2019 revealing total cholesterol 136, LDL of 60 and HDL of 61.

## 2021-02-23 NOTE — Patient Instructions (Addendum)
Medication Instructions:  Your physician recommends that you continue on your current medications as directed. Please refer to the Current Medication list given to you today.  *If you need a refill on your cardiac medications before your next appointment, please call your pharmacy*   Lab Work: Your physician recommends that you have labs drawn today: lipid/liver profile  If you have labs (blood work) drawn today and your tests are completely normal, you will receive your results only by: Marland Kitchen MyChart Message (if you have MyChart) OR . A paper copy in the mail If you have any lab test that is abnormal or we need to change your treatment, we will call you to review the results.   Testing/Procedures: Your physician has requested that you have a lower extremity arterial duplex. This test is an ultrasound of the arteries in the legs. It looks at arterial blood flow in the legs. Allow one hour for Lower Arterial scans. There are no restrictions or special instructions   Your physician has requested that you have an ankle brachial index (ABI). During this test an ultrasound and blood pressure cuff are used to evaluate the arteries that supply the arms and legs with blood. Allow thirty minutes for this exam. There are no restrictions or special instructions.   These procedures are done at [facility]. 2nd Floor. To be done in March of 2023.    Follow-Up: At [facility], you and your health needs are our priority.  As part of our continuing mission to provide you with exceptional heart care, we have created designated Provider Care Teams.  These Care Teams include your primary Cardiologist (physician) and Advanced Practice Providers (APPs -  Physician Assistants and Nurse Practitioners) who all work together to provide you with the care you need, when you need it.  We recommend signing up for the patient portal called "MyChart".  Sign up information is provided on this After Visit Summary.   MyChart is used to connect with patients for Virtual Visits (Telemedicine).  Patients are able to view lab/test results, encounter notes, upcoming appointments, etc.  Non-urgent messages can be sent to your provider as well.   To learn more about what you can do with MyChart, go to [email or web address].    Your next appointment:   6 month(s)  The format for your next appointment:   In Person  Provider:   You will see one of the following Advanced Practice Providers on your designated Care Team:    Sande Rives, PA-C  Coletta Memos, FNP  Then, Quay Burow, MD will plan to see you again in 12 month(s).    Other Instructions Please keep a blood pressure log for 30 days, we will set an appointment to see one of our PharmDs to discuss blood pressure management.

## 2021-02-23 NOTE — Assessment & Plan Note (Signed)
History of essential hypertension a blood pressure measured today 176/80.  She says she checks her blood pressure at home and it usually elevated.  She is on carvedilol, amlodipine, hydralazine, hydrochlorothiazide and Aldactone.  She has normal renal function and no evidence of renal artery stenosis by duplex ultrasound.  Unclear why she is not on any ACE/ARB.  I am going to have her keep a 30-day blood pressure log and see a Pharm.D. back in 4 weeks to evaluate and make medication changes as appropriate.

## 2021-02-23 NOTE — Progress Notes (Signed)
02/23/2021 Katherine [name]   Jul 29, 1956  [id number]  Primary Physician Ladell Pier, MD Primary Cardiologist: Lorretta Harp MD Renae Gloss  HPI:  Katherine [name] is a 65 y.o.  married African-American female with no children who worked as a Scientist, water quality. She was referred to me by Dr. Amalia Hailey for peripheral vascular evaluation because of critical limb ischemia.I last saw her in the  office 01/22/2020..She does have a history of treated hypertension, diabetes and hyperlipidemia. She has never had a heart attack or stroke. She does have a gangrenous left great toe is an ulcer there for several months having undergone several rounds of antibiotic therapy. Her left toe is painful. She had recent venous Doppler study that did not show evidence of DVT.I perform peripheral angiography of her 09/10/17 revealing an occluded left anterior tibial posterior tibial artery with a patent peroneal. I was able to recanalize the anterior tibial down to the foot. Her hospital course was mildly prolonged because of unexplained anemia requiring transfusion. She did not have a retroperitoneal hematoma by CT. Her subsequent lower extremity arterial Doppler studies performed on 09/24/17 showed an increase in her left ABI from 0.67 up to 1.03.When I saw her last approximately 5 weeks ago she still had some edema with an unchanged ischemic ulcer. She also underwent left first toe partial ray amputation by Dr. Amalia Hailey on 10/26/17.  Since I saw her in the office a year ago she continues to do well.  She does have chronic pain in her legs probably related to diabetic peripheral neuropathy.  Her amputation site is well-healed.  Her Doppler studies performed 02/14/2021 revealed normal ABIs bilaterally with a patent left anterior tibial artery.  She denies chest pain or shortness of breath..   Current Meds  Medication Sig  . amLODipine (NORVASC) 10 MG tablet Take 1 tablet (10 mg total) by mouth daily.  Marland Kitchen atorvastatin  (LIPITOR) 80 MG tablet Take 1 tablet (80 mg total) by mouth daily at 6 PM.  . carvedilol (COREG) 6.25 MG tablet Take 1 tablet (6.25 mg total) by mouth 2 (two) times daily with a meal.  . clopidogrel (PLAVIX) 75 MG tablet TAKE 1 TABLET BY MOUTH DAILY WITH BREAKFAST.  Marland Kitchen gabapentin (NEURONTIN) 300 MG capsule Take 3 capsules (900 mg total) by mouth 2 (two) times daily.  Marland Kitchen glucose blood (TRUE METRIX BLOOD GLUCOSE TEST) test strip Use as instructed  . hydrALAZINE (APRESOLINE) 100 MG tablet Take 1 tablet (100 mg total) by mouth 3 (three) times daily.  . hydrochlorothiazide (HYDRODIURIL) 25 MG tablet TAKE 1 TABLET (25 MG TOTAL) BY MOUTH DAILY.  Marland Kitchen insulin glargine, 1 Unit Dial, (TOUJEO) 300 UNIT/ML Solostar Pen INJECT 160 UNITS INTO THE SKIN EVERY MORNING.  . meloxicam (MOBIC) 7.5 MG tablet Take 1 tablet (7.5 mg total) by mouth daily.  . metFORMIN (GLUCOPHAGE) 500 MG tablet Take 1 tablet (500 mg total) by mouth 2 (two) times daily with a meal.  . metoCLOPramide (REGLAN) 5 MG tablet Take 1 tablet (5 mg total) by mouth 2 (two) times daily as needed for nausea.  . Multiple Vitamins-Minerals (MULTIVITAMIN ADULT) CHEW Chew 1 tablet by mouth daily.  Marland Kitchen spironolactone (ALDACTONE) 25 MG tablet Take 0.5 tablets (12.5 mg total) by mouth daily.  Marland Kitchen triamcinolone cream (KENALOG) 0.1 % Apply 1 application topically 2 (two) times daily.  . TRUEPLUS PEN NEEDLES 31G X 5 MM MISC See admin instructions.  . [DISCONTINUED] loratadine (CLARITIN) 10 MG tablet Take 1  tablet (10 mg total) by mouth daily.     Allergies  Allergen Reactions  . Other Swelling    Seaweed= swelling on arms, hands and face  . Metformin And Related Nausea Only    Social History   Socioeconomic History  . Marital status: Married    Spouse name: Not on file  . Number of children: Not on file  . Years of education: Not on file  . Highest education level: Not on file  Occupational History  . Not on file  Tobacco Use  . Smoking status: Never  Smoker  . Smokeless tobacco: Never Used  Vaping Use  . Vaping Use: Never used  Substance and Sexual Activity  . Alcohol use: No  . Drug use: No  . Sexual activity: Never  Other Topics Concern  . Not on file  Social History Narrative  . Not on file   Social Determinants of Health   Financial Resource Strain: Not on file  Food Insecurity: Not on file  Transportation Needs: Not on file  Physical Activity: Not on file  Stress: Not on file  Social Connections: Not on file  Intimate Partner Violence: Not on file     Review of Systems: General: negative for chills, fever, night sweats or weight changes.  Cardiovascular: negative for chest pain, dyspnea on exertion, edema, orthopnea, palpitations, paroxysmal nocturnal dyspnea or shortness of breath Dermatological: negative for rash Respiratory: negative for cough or wheezing Urologic: negative for hematuria Abdominal: negative for nausea, vomiting, diarrhea, bright red blood per rectum, melena, or hematemesis Neurologic: negative for visual changes, syncope, or dizziness All other systems reviewed and are otherwise negative except as noted above.    Blood pressure (!) 176/80, height 6' (1.829 m), weight 216 lb 12.8 oz (98.3 kg), SpO2 97 %.  General appearance: alert and no distress Neck: no adenopathy, no carotid bruit, no JVD, supple, symmetrical, trachea midline and thyroid not enlarged, symmetric, no tenderness/mass/nodules Lungs: clear to auscultation bilaterally Heart: regular rate and rhythm, S1, S2 normal, no murmur, click, rub or gallop Extremities: extremities normal, atraumatic, no cyanosis or edema Pulses: 2+ and symmetric Skin: Skin color, texture, turgor normal. No rashes or lesions Neurologic: Alert and oriented X 3, normal strength and tone. Normal symmetric reflexes. Normal coordination and gait  EKG sinus rhythm at 76 without ST or T wave changes.  I personally reviewed this EKG.  ASSESSMENT AND PLAN:    Essential hypertension History of essential hypertension a blood pressure measured today 176/80.  She says she checks her blood pressure at home and it usually elevated.  She is on carvedilol, amlodipine, hydralazine, hydrochlorothiazide and Aldactone.  She has normal renal function and no evidence of renal artery stenosis by duplex ultrasound.  Unclear why she is not on any ACE/ARB.  I am going to have her keep a 30-day blood pressure log and see a Pharm.D. back in 4 weeks to evaluate and make medication changes as appropriate.  Dyslipidemia History of dyslipidemia on statin therapy with lipid profile performed 12/01/2019 revealing total cholesterol 136, LDL of 60 and HDL of 61.  Critical lower limb ischemia History of critical limb ischemia with a nonhealing ulcer on her left great toe.  I performed angiography and intervention on her occluded left anterior tibial artery 09/10/2017.  She separately had amputation by Dr. Amalia Hailey which ultimately healed.  She does have diabetic peripheral neuropathy with chronic pain.  Recent Doppler studies performed 02/06/2021 revealed normal ABIs bilaterally with a patent left anterior  tibial artery.  I did look at her left foot which shows a healed left great toe amputation site.      Lorretta Harp MD FACP,FACC,FAHA, [facility] 02/23/2021 10:10 AM

## 2021-02-23 NOTE — Assessment & Plan Note (Signed)
History of critical limb ischemia with a nonhealing ulcer on her left great toe.  I performed angiography and intervention on her occluded left anterior tibial artery 09/10/2017.  She separately had amputation by Dr. Amalia Hailey which ultimately healed.  She does have diabetic peripheral neuropathy with chronic pain.  Recent Doppler studies performed 02/06/2021 revealed normal ABIs bilaterally with a patent left anterior tibial artery.  I did look at her left foot which shows a healed left great toe amputation site.

## 2021-03-01 ENCOUNTER — Other Ambulatory Visit: Payer: Self-pay

## 2021-03-10 ENCOUNTER — Telehealth: Payer: Self-pay

## 2021-03-10 NOTE — Telephone Encounter (Signed)
Left message for pt to call back regarding recent lab results.

## 2021-03-22 ENCOUNTER — Ambulatory Visit: Payer: Self-pay | Admitting: Internal Medicine

## 2021-05-02 ENCOUNTER — Encounter (INDEPENDENT_AMBULATORY_CARE_PROVIDER_SITE_OTHER): Payer: Self-pay

## 2021-05-02 ENCOUNTER — Ambulatory Visit: Payer: Self-pay | Admitting: Internal Medicine

## 2021-05-02 ENCOUNTER — Other Ambulatory Visit: Payer: Self-pay

## 2021-05-03 ENCOUNTER — Encounter: Payer: Self-pay | Admitting: Internal Medicine

## 2021-05-03 ENCOUNTER — Other Ambulatory Visit: Payer: Self-pay | Admitting: Internal Medicine

## 2021-05-03 ENCOUNTER — Other Ambulatory Visit: Payer: Self-pay

## 2021-05-03 ENCOUNTER — Ambulatory Visit: Payer: HMO | Attending: Internal Medicine | Admitting: Internal Medicine

## 2021-05-03 VITALS — BP 165/75 | HR 63 | Resp 16 | Ht 72.0 in | Wt 233.6 lb

## 2021-05-03 DIAGNOSIS — S98132A Complete traumatic amputation of one left lesser toe, initial encounter: Secondary | ICD-10-CM

## 2021-05-03 DIAGNOSIS — I152 Hypertension secondary to endocrine disorders: Secondary | ICD-10-CM

## 2021-05-03 DIAGNOSIS — L602 Onychogryphosis: Secondary | ICD-10-CM

## 2021-05-03 DIAGNOSIS — K3184 Gastroparesis: Secondary | ICD-10-CM

## 2021-05-03 DIAGNOSIS — E785 Hyperlipidemia, unspecified: Secondary | ICD-10-CM

## 2021-05-03 DIAGNOSIS — E1142 Type 2 diabetes mellitus with diabetic polyneuropathy: Secondary | ICD-10-CM | POA: Diagnosis not present

## 2021-05-03 DIAGNOSIS — Z23 Encounter for immunization: Secondary | ICD-10-CM

## 2021-05-03 DIAGNOSIS — B351 Tinea unguium: Secondary | ICD-10-CM | POA: Diagnosis not present

## 2021-05-03 DIAGNOSIS — E1165 Type 2 diabetes mellitus with hyperglycemia: Secondary | ICD-10-CM

## 2021-05-03 DIAGNOSIS — IMO0002 Reserved for concepts with insufficient information to code with codable children: Secondary | ICD-10-CM

## 2021-05-03 DIAGNOSIS — Z9229 Personal history of other drug therapy: Secondary | ICD-10-CM

## 2021-05-03 DIAGNOSIS — Z1231 Encounter for screening mammogram for malignant neoplasm of breast: Secondary | ICD-10-CM

## 2021-05-03 DIAGNOSIS — E1159 Type 2 diabetes mellitus with other circulatory complications: Secondary | ICD-10-CM

## 2021-05-03 DIAGNOSIS — E1143 Type 2 diabetes mellitus with diabetic autonomic (poly)neuropathy: Secondary | ICD-10-CM

## 2021-05-03 DIAGNOSIS — Z780 Asymptomatic menopausal state: Secondary | ICD-10-CM

## 2021-05-03 LAB — POCT GLYCOSYLATED HEMOGLOBIN (HGB A1C): HbA1c, POC (controlled diabetic range): 10.3 % — AB (ref 0.0–7.0)

## 2021-05-03 LAB — GLUCOSE, POCT (MANUAL RESULT ENTRY): POC Glucose: 93 mg/dl (ref 70–99)

## 2021-05-03 MED ORDER — HYDROCHLOROTHIAZIDE 25 MG PO TABS
25.0000 mg | ORAL_TABLET | Freq: Every day | ORAL | 6 refills | Status: DC
  Filled 2021-05-03 – 2021-05-06 (×2): qty 30, 30d supply, fill #0
  Filled 2021-07-29: qty 30, 30d supply, fill #1
  Filled 2021-11-16: qty 30, 30d supply, fill #2

## 2021-05-03 MED ORDER — METFORMIN HCL 1000 MG PO TABS
1000.0000 mg | ORAL_TABLET | Freq: Two times a day (BID) | ORAL | 5 refills | Status: DC
  Filled 2021-05-03 – 2021-05-06 (×2): qty 60, 30d supply, fill #0
  Filled 2021-07-29: qty 60, 30d supply, fill #1

## 2021-05-03 MED ORDER — CICLOPIROX 8 % EX SOLN
Freq: Every day | CUTANEOUS | 0 refills | Status: DC
  Filled 2021-05-03: qty 6.6, 30d supply, fill #0

## 2021-05-03 MED ORDER — SPIRONOLACTONE 25 MG PO TABS
12.5000 mg | ORAL_TABLET | Freq: Every day | ORAL | 6 refills | Status: DC
Start: 2021-05-03 — End: 2021-05-04
  Filled 2021-05-03: qty 30, 60d supply, fill #0

## 2021-05-03 MED ORDER — CARVEDILOL 6.25 MG PO TABS
6.2500 mg | ORAL_TABLET | Freq: Two times a day (BID) | ORAL | 6 refills | Status: DC
  Filled 2021-05-03 – 2021-05-06 (×2): qty 60, 30d supply, fill #0
  Filled 2021-07-29: qty 60, 30d supply, fill #1
  Filled 2021-11-16: qty 60, 30d supply, fill #2

## 2021-05-03 MED ORDER — ATORVASTATIN CALCIUM 80 MG PO TABS
80.0000 mg | ORAL_TABLET | Freq: Every day | ORAL | 3 refills | Status: DC
  Filled 2021-05-03 – 2021-05-06 (×2): qty 90, 90d supply, fill #0
  Filled 2021-07-29: qty 90, 90d supply, fill #1
  Filled 2021-11-16: qty 90, 90d supply, fill #2

## 2021-05-03 MED ORDER — AMLODIPINE BESYLATE 10 MG PO TABS
10.0000 mg | ORAL_TABLET | Freq: Every day | ORAL | 6 refills | Status: DC
  Filled 2021-05-03 – 2021-05-06 (×2): qty 30, 30d supply, fill #0
  Filled 2021-07-29: qty 30, 30d supply, fill #1

## 2021-05-03 MED ORDER — METOCLOPRAMIDE HCL 5 MG PO TABS
5.0000 mg | ORAL_TABLET | Freq: Two times a day (BID) | ORAL | 1 refills | Status: DC | PRN
  Filled 2021-05-03: qty 60, 30d supply, fill #0

## 2021-05-03 MED ORDER — FREESTYLE LIBRE READER DEVI: 1.0000 | Freq: Once | 0 refills | Status: AC

## 2021-05-03 MED ORDER — HYDRALAZINE HCL 100 MG PO TABS
100.0000 mg | ORAL_TABLET | Freq: Three times a day (TID) | ORAL | 6 refills | Status: DC
Start: 2021-05-03 — End: 2023-03-07
  Filled 2021-05-03: qty 90, 30d supply, fill #0

## 2021-05-03 MED ORDER — GABAPENTIN 600 MG PO TABS
600.0000 mg | ORAL_TABLET | Freq: Two times a day (BID) | ORAL | 6 refills | Status: DC
  Filled 2021-05-03 – 2021-11-16 (×4): qty 60, 30d supply, fill #0

## 2021-05-03 MED ORDER — INSULIN GLARGINE (1 UNIT DIAL) 300 UNIT/ML ~~LOC~~ SOPN
185.0000 [IU] | PEN_INJECTOR | Freq: Every day | SUBCUTANEOUS | 6 refills | Status: DC
  Filled 2021-05-03 (×2): qty 18, 29d supply, fill #0

## 2021-05-03 MED ORDER — FREESTYLE LIBRE SENSOR SYSTEM MISC: 12 refills | Status: DC

## 2021-05-03 NOTE — Progress Notes (Signed)
Patient ID: Katherine [name], female    DOB: Jun 04, 1956  MRN: [id number]  CC: Diabetes and Hypertension   Subjective: Katherine [name] is a 65 y.o. female who presents for chronic ds management Her concerns today include:  Hx of HTN, DM with neuropathy, retinopathy and possible gastroparesis, HL, depression, GERD, PAD, s/p amputation LT 1st toe, BL RAS 59%.  Last seen 03/2020.  Pt states she tried to get appt earlier but was told that no slots available on 2 occasions  Now has insurance. Approved for Medicare; just got card yesterday  HYPERTENSION Currently taking: see medication list Med Adherence: Out of HCTZ and Spironolactone.  Taking Coreg, Amlodipine, Hydralazine from her husband as she ran out of her own   Medication side effects: []  Yes    [x]  No Adherence with salt restriction: [x]  Yes    []  No Home Monitoring?: [x]  Yes    []  No Monitoring Frequency: once a wk Home BP results range: 135-145/50-55 SOB? []  Yes    [x]  No Chest Pain?: []  Yes    [x]  No Leg swelling?: [x]  Yes  x 4 days Headaches?: []  Yes    [x]  No Dizziness? []  Yes    [x]  No Comments:   DIABETES TYPE 2 Last A1C:   Results for orders placed or performed in visit on 05/03/21  POCT glucose (manual entry)  Result Value Ref Range   POC Glucose 93 70 - 99 mg/dl  POCT glycosylated hemoglobin (Hb A1C)  Result Value Ref Range   Hemoglobin A1C     HbA1c POC (<> result, manual entry)     HbA1c, POC (prediabetic range)     HbA1c, POC (controlled diabetic range) 10.3 (A) 0.0 - 7.0 %    Med Adherence:  [x]  Yes Suppose to be on Toujeo  160 units but self increased to 200 units 2 wks ago because BS were staying in the 250-300 range.  Taking Metformin 500 mg 2 tabs at bedtime. Medication side effects:  []  Yes    [x]  No Home Monitoring?  [x]  Yes  -once a  day after dinner.  Was staying 250-300.  Inc Toujeo 2 wks ago, now in the [place].  Wants Libre Meter Home glucose results range: Diet Adherence: eat habits erratic. Drinks  mainly water. Exercise: []  Yes    [x]  No Hypoglycemic episodes?: [x]  Yes - about 2 x/wk early mornings since she increase dose of Touejo.  Lowest 75 Numbness of the feet? [x]  Yes - getting worse. Suppose to be on Gabapentin 900 mg BID BUT reports taking 600 mg BID.  Retinopathy hx? [x]  Yes    []  No Last eye exam:  reports having had eye exam recently, thinks it was at [facility].  Reports changes from DM seen on eye exam + cataract Comments: She was seeing Dr. Loanne Drilling in the past.  Now that she has insurance she would like to see a different endocrinologist.  HL:  taking and tolerating Lipitor.  Saw Dr. Alvester Chou in April.  LDL cholesterol was 100.  HM:  Due for MMG, bone density.  Reports having had 3 doses of the Moderna COVID-19 vaccine.  She does not have card with her.  Patient Active Problem List   Diagnosis Date Noted   Diabetes ([place]) 12/22/2019   Renal artery stenosis ([place]) 12/01/2019   Diabetic polyneuropathy associated with type 2 diabetes mellitus ([place]) 05/14/2018   Thyroid nodule 10/04/2017   Microcytic anemia 09/11/2017   Critical lower limb ischemia (HCC)  09/10/2017   Retinopathy 08/22/2017   Thyroid enlargement 07/12/2017   Dyslipidemia 09/20/2016   DEPRESSION 09/10/2009   LEG CRAMPS 05/19/2009   GERD 02/15/2009   Gastroparesis 09/03/2008   DENTAL CARIES 07/14/2008   Essential hypertension 09/16/2003     Current Outpatient Medications on File Prior to Visit  Medication Sig Dispense Refill   atorvastatin (LIPITOR) 80 MG tablet Take 1 tablet (80 mg total) by mouth daily at 6 PM. 30 tablet 6   clopidogrel (PLAVIX) 75 MG tablet TAKE 1 TABLET BY MOUTH DAILY WITH BREAKFAST. 90 tablet 3   meloxicam (MOBIC) 7.5 MG tablet Take 1 tablet (7.5 mg total) by mouth daily. 30 tablet 0   Multiple Vitamins-Minerals (MULTIVITAMIN ADULT) CHEW Chew 1 tablet by mouth daily.     TRUEPLUS PEN NEEDLES 31G X 5 MM MISC See admin instructions.  11   No current facility-administered  medications on file prior to visit.    Allergies  Allergen Reactions   Other Swelling    Seaweed= swelling on arms, hands and face   Metformin And Related Nausea Only    Social History   Socioeconomic History   Marital status: Married    Spouse name: Not on file   Number of children: Not on file   Years of education: Not on file   Highest education level: Not on file  Occupational History   Not on file  Tobacco Use   Smoking status: Never   Smokeless tobacco: Never  Vaping Use   Vaping Use: Never used  Substance and Sexual Activity   Alcohol use: No   Drug use: No   Sexual activity: Never  Other Topics Concern   Not on file  Social History Narrative   Not on file   Social Determinants of Health   Financial Resource Strain: Not on file  Food Insecurity: Not on file  Transportation Needs: Not on file  Physical Activity: Not on file  Stress: Not on file  Social Connections: Not on file  Intimate Partner Violence: Not on file    Family History  Problem Relation Age of Onset   Diabetes Father    Hypertension Father    Kidney disease Mother    Hypertension Mother    Cancer Maternal Grandmother    Hypertension Maternal Grandfather    Diabetes Paternal Aunt    Diabetes Paternal Uncle    Diabetes Paternal Grandfather    Colon cancer Neg Hx    Breast cancer Neg Hx     Past Surgical History:  Procedure Laterality Date   AMPUTATION TOE Left 10/26/2017   Procedure: AMPUTATION PARTIAL RAY LEFT FOOT AND IRRIGATION/DEBRIDEMENT;  Surgeon: Edrick Kins, DPM;  Location: [facility];  Service: Podiatry;  Laterality: Left;  AMPUTATION PARTIAL RAY LEFT FOOT AND IRRIGATION/DEBRIDEMENT   CHOLECYSTECTOMY  01/24/2003   Archie Endo 04/04/2011   COLONOSCOPY     INCISION AND DRAINAGE ABSCESS  03/17/2012   "right but; left lower abdoment"/notes 03/17/2012   LOWER EXTREMITY INTERVENTION N/A 09/10/2017   Procedure: LOWER EXTREMITY INTERVENTION;  Surgeon: Lorretta Harp, MD;  Location: [facility];  Service: Cardiovascular;  Laterality: N/A;   PERIPHERAL VASCULAR ATHERECTOMY  09/10/2017   Procedure: PERIPHERAL VASCULAR ATHERECTOMY;  Surgeon: Lorretta Harp, MD;  Location: [facility];  Service: Cardiovascular;;  left AT    ROS: Review of Systems Negative except as stated above  PHYSICAL EXAM: BP (!) 165/75   Pulse 63   Resp 16   Ht 6' (  1.829 m)   Wt 233 lb 9.6 oz (106 kg)   SpO2 98%   BMI 31.68 kg/m   Wt Readings from Last 3 Encounters:  05/03/21 233 lb 9.6 oz (106 kg)  02/23/21 216 lb 12.8 oz (98.3 kg)  04/09/20 224 lb 6.4 oz (101.8 kg)    Physical Exam  General appearance - alert, well appearing, older African-American female and in no distress Mental status - normal mood, behavior, speech, dress, motor activity, and thought processes Neck - supple, no significant adenopathy Chest - clear to auscultation, no wheezes, rales or rhonchi, symmetric air entry Heart - normal rate, regular rhythm, normal S1, S2, no murmurs, rubs, clicks or gallops Extremities -1+ bilateral lower extremity edema Diabetic Foot Exam - Simple   Simple Foot Form Visual Inspection See comments: Yes Sensation Testing See comments: Yes Pulse Check Posterior Tibialis and Dorsalis pulse intact bilaterally: Yes Comments Flat-footed.  Left big toe has been amputated.  Toenails discolored, thick and overgrown.  Decreased sensation on plantar surface of both feet.      CMP Latest Ref Rng & Units 02/23/2021 12/01/2019 12/11/2018  Glucose 65 - 99 mg/dL - 180(H) 201(H)  BUN 8 - 27 mg/dL - 19 13  Creatinine 0.57 - 1.00 mg/dL - 0.96 0.90  Sodium 134 - 144 mmol/L - 140 137  Potassium 3.5 - 5.2 mmol/L - 4.3 3.9  Chloride 96 - 106 mmol/L - 97 103  CO2 20 - 29 mmol/L - 29 26  Calcium 8.7 - 10.3 mg/dL - 9.7 8.9  Total Protein 6.0 - 8.5 g/dL 7.0 6.5 -  Total Bilirubin 0.0 - 1.2 mg/dL 0.3 0.5 -  Alkaline Phos 44 - 121 IU/L 154(H) 141(H) -  AST 0 - 40 IU/L 13 12 -  ALT 0 - 32  IU/L 11 14 -   Lipid Panel     Component Value Date/Time   CHOL 175 02/23/2021 1025   TRIG 106 02/23/2021 1025   HDL 56 02/23/2021 1025   CHOLHDL 3.1 02/23/2021 1025   CHOLHDL 3.8 09/19/2016 1452   VLDL 36 (H) 09/19/2016 1452   LDLCALC 100 (H) 02/23/2021 1025    CBC    Component Value Date/Time   WBC 5.4 12/01/2019 1119   WBC 4.5 12/11/2018 1352   RBC 4.94 12/01/2019 1119   RBC 4.43 12/11/2018 1352   HGB 13.8 12/01/2019 1119   HCT 42.2 12/01/2019 1119   PLT 234 12/01/2019 1119   MCV 85 12/01/2019 1119   MCH 27.9 12/01/2019 1119   MCH 27.5 12/11/2018 1352   MCHC 32.7 12/01/2019 1119   MCHC 31.8 12/11/2018 1352   RDW 12.4 12/01/2019 1119   LYMPHSABS 1.9 08/28/2018 1424   MONOABS 0.4 05/24/2018 1239   EOSABS 0.2 08/28/2018 1424   BASOSABS 0.0 08/28/2018 1424    ASSESSMENT AND PLAN: 1. Uncontrolled type 2 diabetes mellitus with peripheral neuropathy (HCC) A1c not at goal. Advised patient to be careful with self increases of Toujeo as it is a concentrated insulin formula.  Given early morning hypoglycemic episodes, I recommend backing down on the dose to 185 units daily.   -Discussed adding another oral medication like Farxiga.  Prefers to try increased dose of metformin instead.  Reminded her that in the past she got nauseated when she took metformin in the mornings.  She states she will take her first dose at noon and then continue her evening dose. -Prescription sent for [facility] device. Request that she verify information of where she had  her eye exam done and when so that I can update her health maintenance. - POCT glucose (manual entry) - POCT glycosylated hemoglobin (Hb A1C) - CBC - Microalbumin / creatinine urine ratio - Basic metabolic panel - Continuous Blood Gluc Sensor (FREESTYLE LIBRE SENSOR SYSTEM) MISC; Change sensor Q 2 wks  Dispense: 2 each; Refill: 12 - Continuous Blood Gluc Receiver (FREESTYLE LIBRE READER) DEVI; 1 Device by Does not apply route once for 1  dose.  Dispense: 1 each; Refill: 0 - insulin glargine, 1 Unit Dial, (TOUJEO) 300 UNIT/ML Solostar Pen; Inject 185 Units into the skin daily.  Dispense: 30 mL; Refill: 6 - metoCLOPramide (REGLAN) 5 MG tablet; Take 1 tablet (5 mg total) by mouth 2 (two) times daily as needed for nausea.  Dispense: 60 tablet; Refill: 1 - gabapentin (NEURONTIN) 600 MG tablet; Take 1 tablet (600 mg total) by mouth 2 (two) times daily.  Dispense: 60 tablet; Refill: 6 - metFORMIN (GLUCOPHAGE) 1000 MG tablet; Take 1 tablet (1,000 mg total) by mouth 2 (two) times daily with a meal.  Dispense: 60 tablet; Refill: 5 - Ambulatory referral to Endocrinology - Ambulatory referral to Podiatry  2. Hypertension associated with diabetes ([place]) Not at goal.  She has been out of spironolactone and HCTZ.  Refills given on all medications so that she can stop using her husband's medicines. - amLODipine (NORVASC) 10 MG tablet; Take 1 tablet (10 mg total) by mouth daily.  Dispense: 30 tablet; Refill: 6 - hydrALAZINE (APRESOLINE) 100 MG tablet; Take 1 tablet (100 mg total) by mouth 3 (three) times daily.  Dispense: 90 tablet; Refill: 6 - spironolactone (ALDACTONE) 25 MG tablet; Take 0.5 tablets (12.5 mg total) by mouth daily.  Dispense: 30 tablet; Refill: 6 - hydrochlorothiazide (HYDRODIURIL) 25 MG tablet; Take 1 tablet (25 mg total) by mouth daily.  Dispense: 30 tablet; Refill: 6 - carvedilol (COREG) 6.25 MG tablet; Take 1 tablet (6.25 mg total) by mouth 2 (two) times daily with a meal.  Dispense: 60 tablet; Refill: 6  3. Hyperlipidemia associated with type 2 diabetes mellitus ([place]) Patient advised that atorvastatin is not preferred based on her insurance.  I recommend change to Crestor but patient insisted on wanting to keep the Lipitor stating that she has a lot of it at home still. - atorvastatin (LIPITOR) 80 MG tablet; Take 1 tablet (80 mg total) by mouth daily.  Dispense: 90 tablet; Refill: 3  4. Onychomycosis of toenail She  requested treatment for onychomycosis.  Discussed treatment options. Informed oral med works best but can cause drug-induced hepatitis.  She prefers to try topical.  Discussed Penlac.  She is willing to give this a trial for several months.  - ciclopirox (PENLAC) 8 % solution; Apply topically at bedtime. Apply over nail and surrounding skin. Apply daily over previous coat. After seven (7) days, may remove with alcohol and continue cycle.  Dispense: 6.6 mL; Refill: 0  5. Overgrown toenails - Ambulatory referral to Podiatry  6. Amputation of toe of left foot ([place])   7. Postmenopausal estrogen deficiency Agreeable to getting bone density study. - DG Bone Density; Future  8. COVID-19 vaccine series completed Advised that she please bring her card on her next visit so that we can update her health maintenance  9. Encounter for screening mammogram for malignant neoplasm of breast - MM Digital Screening; Future  10. Gastroparesis due to DM ([place]) Refill given on Reglan to use as needed - metoCLOPramide (REGLAN) 5 MG tablet; Take 1 tablet (5  mg total) by mouth 2 (two) times daily as needed for nausea.  Dispense: 60 tablet; Refill: 1  11. Need for vaccination against Streptococcus pneumoniae - Pneumococcal conjugate vaccine 20-valent (Prevnar 20)     Patient was given the opportunity to ask questions.  Patient verbalized understanding of the plan and was able to repeat key elements of the plan.   Orders Placed This Encounter  Procedures   MM Digital Screening   DG Bone Density   Pneumococcal conjugate vaccine 20-valent (Prevnar 20)   CBC   Microalbumin / creatinine urine ratio   Basic metabolic panel   Ambulatory referral to Endocrinology   Ambulatory referral to Podiatry   POCT glucose (manual entry)   POCT glycosylated hemoglobin (Hb A1C)     Requested Prescriptions   Signed Prescriptions Disp Refills   ciclopirox (PENLAC) 8 % solution 6.6 mL 0    Sig: Apply topically at  bedtime. Apply over nail and surrounding skin. Apply daily over previous coat. After seven (7) days, may remove with alcohol and continue cycle.   Continuous Blood Gluc Sensor (FREESTYLE LIBRE SENSOR SYSTEM) MISC 2 each 12    Sig: Change sensor Q 2 wks   Continuous Blood Gluc Receiver (FREESTYLE LIBRE READER) DEVI 1 each 0    Sig: 1 Device by Does not apply route once for 1 dose.   insulin glargine, 1 Unit Dial, (TOUJEO) 300 UNIT/ML Solostar Pen 30 mL 6    Sig: Inject 185 Units into the skin daily.   amLODipine (NORVASC) 10 MG tablet 30 tablet 6    Sig: Take 1 tablet (10 mg total) by mouth daily.   hydrALAZINE (APRESOLINE) 100 MG tablet 90 tablet 6    Sig: Take 1 tablet (100 mg total) by mouth 3 (three) times daily.   spironolactone (ALDACTONE) 25 MG tablet 30 tablet 6    Sig: Take 0.5 tablets (12.5 mg total) by mouth daily.   hydrochlorothiazide (HYDRODIURIL) 25 MG tablet 30 tablet 6    Sig: Take 1 tablet (25 mg total) by mouth daily.   metoCLOPramide (REGLAN) 5 MG tablet 60 tablet 1    Sig: Take 1 tablet (5 mg total) by mouth 2 (two) times daily as needed for nausea.   carvedilol (COREG) 6.25 MG tablet 60 tablet 6    Sig: Take 1 tablet (6.25 mg total) by mouth 2 (two) times daily with a meal.   gabapentin (NEURONTIN) 600 MG tablet 60 tablet 6    Sig: Take 1 tablet (600 mg total) by mouth 2 (two) times daily.   metFORMIN (GLUCOPHAGE) 1000 MG tablet 60 tablet 5    Sig: Take 1 tablet (1,000 mg total) by mouth 2 (two) times daily with a meal.   atorvastatin (LIPITOR) 80 MG tablet 90 tablet 3    Sig: Take 1 tablet (80 mg total) by mouth daily.    Return in about 4 months (around 09/02/2021).  Karle Plumber, MD, FACP

## 2021-05-03 NOTE — Patient Instructions (Signed)
Change her insulin dose to 85 units daily. Metformin increased to 1000 mg twice a day.  I have sent the 100 mg tablet to the pharmacy. I have referred you to the endocrinologist.  I have referred you to the podiatrist.

## 2021-05-03 NOTE — Progress Notes (Signed)
Pt states she is also having left foot pain

## 2021-05-04 ENCOUNTER — Other Ambulatory Visit: Payer: Self-pay

## 2021-05-04 ENCOUNTER — Telehealth: Payer: Self-pay | Admitting: Internal Medicine

## 2021-05-04 ENCOUNTER — Encounter: Payer: Self-pay | Admitting: Internal Medicine

## 2021-05-04 ENCOUNTER — Telehealth: Payer: Self-pay

## 2021-05-04 DIAGNOSIS — R809 Proteinuria, unspecified: Secondary | ICD-10-CM | POA: Insufficient documentation

## 2021-05-04 LAB — MICROALBUMIN / CREATININE URINE RATIO
Creatinine, Urine: 145.5 mg/dL
Microalb/Creat Ratio: 363 mg/g creat — ABNORMAL HIGH (ref 0–29)
Microalbumin, Urine: 527.9 ug/mL

## 2021-05-04 LAB — BASIC METABOLIC PANEL
BUN/Creatinine Ratio: 14 (ref 12–28)
BUN: 11 mg/dL (ref 8–27)
CO2: 26 mmol/L (ref 20–29)
Calcium: 9.2 mg/dL (ref 8.7–10.3)
Chloride: 103 mmol/L (ref 96–106)
Creatinine, Ser: 0.8 mg/dL (ref 0.57–1.00)
Glucose: 73 mg/dL (ref 65–99)
Potassium: 3.7 mmol/L (ref 3.5–5.2)
Sodium: 144 mmol/L (ref 134–144)
eGFR: 82 mL/min/{1.73_m2} (ref >59)

## 2021-05-04 LAB — CBC
Hematocrit: 40.3 % (ref 34.0–46.6)
Hemoglobin: 13.1 g/dL (ref 11.1–15.9)
MCH: 28.2 pg (ref 26.6–33.0)
MCHC: 32.5 g/dL (ref 31.5–35.7)
MCV: 87 fL (ref 79–97)
Platelets: 235 10*3/uL (ref 150–450)
RBC: 4.65 x10E6/uL (ref 3.77–5.28)
RDW: 12.5 % (ref 11.7–15.4)
WBC: 5.2 10*3/uL (ref 3.4–10.8)

## 2021-05-04 MED ORDER — LISINOPRIL 5 MG PO TABS
5.0000 mg | ORAL_TABLET | Freq: Every day | ORAL | 6 refills | Status: DC
  Filled 2021-05-04: qty 30, 30d supply, fill #0
  Filled 2021-07-29: qty 30, 30d supply, fill #1

## 2021-05-04 NOTE — Progress Notes (Signed)
Let patient know that her blood count is normal meaning red blood cell, white blood cell and platelet counts are normal.  Kidney function is good.  Please ask if she has her card handy with the dates for the COVID-19 vaccine so that we could updated it in the system.  She was also supposed to provide me with the date that she had her eye exam done so that we can update that in the system also.

## 2021-05-04 NOTE — Telephone Encounter (Signed)
Contacted pt to go over lab results pt is aware and doesn't have any questions or concerns   Pt states she is going to be coming by to pick up medications and will bring it then

## 2021-05-04 NOTE — Telephone Encounter (Signed)
PC placed to pt today to informed of microalbumin. Advised to stop Spironolactone. Start low dose Lisinopril instead.  Looks like pt was on ACE-I back in 2016.  She does not recall any adverse reaction to it.  Message sent to clinical pharmacist of medication change as pt plans to pick up meds today.

## 2021-05-06 ENCOUNTER — Other Ambulatory Visit: Payer: Self-pay

## 2021-05-10 ENCOUNTER — Other Ambulatory Visit: Payer: Self-pay

## 2021-05-17 ENCOUNTER — Other Ambulatory Visit: Payer: Self-pay

## 2021-05-17 ENCOUNTER — Other Ambulatory Visit: Payer: Self-pay | Admitting: Internal Medicine

## 2021-05-18 ENCOUNTER — Other Ambulatory Visit: Payer: Self-pay

## 2021-05-19 ENCOUNTER — Other Ambulatory Visit: Payer: Self-pay

## 2021-05-19 ENCOUNTER — Other Ambulatory Visit: Payer: Self-pay | Admitting: Cardiovascular Disease

## 2021-05-19 MED ORDER — CLOPIDOGREL BISULFATE 75 MG PO TABS
ORAL_TABLET | ORAL | 2 refills | Status: DC
  Filled 2021-05-19 – 2021-05-26 (×2): qty 90, 90d supply, fill #0
  Filled 2021-11-16: qty 90, 90d supply, fill #1

## 2021-05-19 NOTE — Telephone Encounter (Signed)
Rx(s) sent to pharmacy electronically.  

## 2021-05-26 ENCOUNTER — Other Ambulatory Visit: Payer: Self-pay

## 2021-05-26 ENCOUNTER — Other Ambulatory Visit: Payer: Self-pay | Admitting: Pharmacist

## 2021-05-26 DIAGNOSIS — E1142 Type 2 diabetes mellitus with diabetic polyneuropathy: Secondary | ICD-10-CM

## 2021-05-26 DIAGNOSIS — IMO0002 Reserved for concepts with insufficient information to code with codable children: Secondary | ICD-10-CM

## 2021-05-26 MED ORDER — FREESTYLE LIBRE 2 READER DEVI
12 refills | Status: DC
  Filled 2021-05-26 (×2): qty 1, 30d supply, fill #0

## 2021-05-26 MED ORDER — FREESTYLE LIBRE 2 SENSOR MISC
12 refills | Status: DC
  Filled 2021-05-26: qty 2, 28d supply, fill #0
  Filled 2021-07-27: qty 2, 28d supply, fill #1

## 2021-05-27 ENCOUNTER — Other Ambulatory Visit: Payer: Self-pay

## 2021-05-30 ENCOUNTER — Other Ambulatory Visit: Payer: Self-pay

## 2021-06-01 ENCOUNTER — Other Ambulatory Visit: Payer: Self-pay

## 2021-06-08 ENCOUNTER — Telehealth: Payer: Self-pay | Admitting: Internal Medicine

## 2021-06-08 NOTE — Telephone Encounter (Addendum)
If pt comes in for appt, please let hr know that Katherine [name] a case manager is trying to get in touch with her Cb [phone]  Anyone that sees this message and sees her, Katherine would appreciate.

## 2021-06-13 ENCOUNTER — Other Ambulatory Visit: Payer: Self-pay

## 2021-06-13 ENCOUNTER — Encounter: Payer: Self-pay | Admitting: Podiatry

## 2021-06-13 ENCOUNTER — Ambulatory Visit (INDEPENDENT_AMBULATORY_CARE_PROVIDER_SITE_OTHER): Payer: HMO | Admitting: Podiatry

## 2021-06-13 DIAGNOSIS — B351 Tinea unguium: Secondary | ICD-10-CM | POA: Diagnosis not present

## 2021-06-13 DIAGNOSIS — E0842 Diabetes mellitus due to underlying condition with diabetic polyneuropathy: Secondary | ICD-10-CM

## 2021-06-13 DIAGNOSIS — M79676 Pain in unspecified toe(s): Secondary | ICD-10-CM | POA: Diagnosis not present

## 2021-06-13 DIAGNOSIS — S98112A Complete traumatic amputation of left great toe, initial encounter: Secondary | ICD-10-CM

## 2021-06-13 NOTE — Progress Notes (Signed)
This patient returns to my office for at risk foot care.  This patient requires this care by a professional since this patient will be at risk due to having diabetic neuropathy, PVD and amputation left big toe.  This patient is unable to cut nails herself since the patient cannot reach her nails.These nails are painful walking and wearing shoes.  She desires diabetic shoes.   This patient presents for at risk foot care today.  General Appearance  Alert, conversant and in no acute stress.  Vascular  Dorsalis pedis and posterior tibial  pulses are  weakly palpable  bilaterally.  Capillary return is within normal limits  bilaterally. Temperature is within normal limits  bilaterally.  Neurologic  Senn-Weinstein monofilament wire test  diminished  bilaterally. Muscle power within normal limits bilaterally.  Nails Thick disfigured discolored nails with subungual debris  from hallux to fifth toes right foot and 2-5  left foot. No evidence of bacterial infection or drainage bilaterally.  Orthopedic  No limitations of motion  feet .  No crepitus or effusions noted.  No bony pathology or digital deformities noted. Amputation left hallux.  Skin  normotropic skin with no porokeratosis noted bilaterally.  No signs of infections or ulcers noted.     Onychomycosis  Pain in right toes  Pain in left toes  Consent was obtained for treatment procedures.   Mechanical debridement of nails  bilaterally performed with a nail nipper.  Filed with dremel without incident. Patient qualifies for diabetic shoes due to amputation, PVD and DPN. To make an appointment with pedorthist.   Return office visit   3 months.                    Told patient to return for periodic foot care and evaluation due to potential at risk complications.   Gardiner Barefoot DPM

## 2021-06-17 LAB — HM DIABETES EYE EXAM

## 2021-06-23 ENCOUNTER — Other Ambulatory Visit: Payer: Self-pay

## 2021-06-23 ENCOUNTER — Other Ambulatory Visit: Payer: HMO

## 2021-06-29 ENCOUNTER — Other Ambulatory Visit: Payer: Self-pay

## 2021-06-29 ENCOUNTER — Ambulatory Visit
Admission: RE | Admit: 2021-06-29 | Discharge: 2021-06-29 | Disposition: A | Discharge status: Home / Self Care | Payer: HMO | Source: Ambulatory Visit | Attending: Internal Medicine | Admitting: Internal Medicine

## 2021-06-29 ENCOUNTER — Telehealth: Payer: Self-pay | Admitting: Podiatry

## 2021-06-29 DIAGNOSIS — Z1231 Encounter for screening mammogram for malignant neoplasm of breast: Secondary | ICD-10-CM

## 2021-06-29 NOTE — Telephone Encounter (Signed)
Called pt to discuss the toe filler insert for the left foot , I wanted to make sure pt wanted [place] to order it and she said she did not want to get it.

## 2021-07-04 ENCOUNTER — Other Ambulatory Visit: Payer: Self-pay | Admitting: Internal Medicine

## 2021-07-04 DIAGNOSIS — R928 Other abnormal and inconclusive findings on diagnostic imaging of breast: Secondary | ICD-10-CM

## 2021-07-18 ENCOUNTER — Encounter: Payer: Self-pay | Admitting: *Deleted

## 2021-07-19 ENCOUNTER — Telehealth: Payer: Self-pay | Admitting: Podiatry

## 2021-07-19 NOTE — Telephone Encounter (Signed)
Received HTA auth for diabetic shoes/inserts(A5500 x2/A5514 X6) valid 8.26.2022 thru 11.24.2022

## 2021-07-22 ENCOUNTER — Other Ambulatory Visit: Payer: Self-pay

## 2021-07-22 MED ORDER — INSULIN ASPART FLEXPEN 100 UNIT/ML ~~LOC~~ SOPN
PEN_INJECTOR | SUBCUTANEOUS | 3 refills | Status: DC
  Filled 2021-07-22: qty 45, 88d supply, fill #0

## 2021-07-24 ENCOUNTER — Telehealth: Payer: Self-pay

## 2021-07-24 NOTE — Telephone Encounter (Signed)
Called pt to schedule annual wellness visit, no answer and voicemail not set up

## 2021-07-27 ENCOUNTER — Other Ambulatory Visit: Payer: Self-pay

## 2021-07-28 ENCOUNTER — Other Ambulatory Visit: Payer: Self-pay

## 2021-07-28 ENCOUNTER — Telehealth: Payer: Self-pay | Admitting: Internal Medicine

## 2021-07-28 NOTE — Telephone Encounter (Signed)
C placed to pt this evening regarding her MMG report.  Pt confirms getting the letter from [facility] imaging requesting that she return for additional imaging.  Pt states she has been busy going back and forth to [place] with her husband.  She plans to call [place] tomorrow to set up the appt.  I encouraged her to do so.  Pt informed that they see an area of concern in the LT breast and they want to do more images of that particular area to try to decipher whether it is something benign or needs to be biopsied.  Patient expressed understanding and states she will take care of it.

## 2021-07-29 ENCOUNTER — Other Ambulatory Visit: Payer: Self-pay

## 2021-07-30 DIAGNOSIS — E1122 Type 2 diabetes mellitus with diabetic chronic kidney disease: Secondary | ICD-10-CM | POA: Diagnosis present

## 2021-07-30 DIAGNOSIS — E1151 Type 2 diabetes mellitus with diabetic peripheral angiopathy without gangrene: Secondary | ICD-10-CM

## 2021-07-30 DIAGNOSIS — E1143 Type 2 diabetes mellitus with diabetic autonomic (poly)neuropathy: Secondary | ICD-10-CM | POA: Insufficient documentation

## 2021-07-30 DIAGNOSIS — I129 Hypertensive chronic kidney disease with stage 1 through stage 4 chronic kidney disease, or unspecified chronic kidney disease: Secondary | ICD-10-CM | POA: Diagnosis present

## 2021-08-11 ENCOUNTER — Telehealth: Payer: Self-pay

## 2021-08-11 NOTE — Telephone Encounter (Signed)
Called pt to schedule Annual Wellness Visit, No answer, could not leave message , voicemail not set up.

## 2021-08-16 ENCOUNTER — Telehealth: Payer: Self-pay | Admitting: Podiatry

## 2021-08-16 NOTE — Telephone Encounter (Signed)
Diabetic shoes/inserts in... called to schedule an appt and pt has no voicemail set up.

## 2021-08-31 ENCOUNTER — Other Ambulatory Visit: Payer: HMO

## 2021-09-02 ENCOUNTER — Telehealth: Payer: Self-pay | Admitting: *Deleted

## 2021-09-02 DIAGNOSIS — E1051 Type 1 diabetes mellitus with diabetic peripheral angiopathy without gangrene: Secondary | ICD-10-CM

## 2021-09-02 NOTE — Telephone Encounter (Signed)
Copied from [place] [id number]. Topic: Referral - Question >> Sep 02, 2021 12:18 PM Pawlus, Brayton Layman A wrote: Reason for CRM: Pt stated she needs a referral sent as soon as possible to Lanney Gins, MD for Neuropathy at [facility], please advise, pt has an appt coming up and stated this was urgent.

## 2021-09-05 NOTE — Telephone Encounter (Signed)
Will forward to pcp

## 2021-09-09 ENCOUNTER — Other Ambulatory Visit: Payer: Self-pay

## 2021-09-09 MED ORDER — ONETOUCH DELICA PLUS LANCET33G MISC
3 refills | Status: DC
  Filled 2021-09-23: qty 100, 25d supply, fill #0
  Filled 2021-11-17: qty 100, 25d supply, fill #1

## 2021-09-09 MED ORDER — FREESTYLE LIBRE 2 READER DEVI: 0 refills | Status: DC

## 2021-09-09 MED ORDER — INSULIN ASPART 100 UNIT/ML FLEXPEN
PEN_INJECTOR | SUBCUTANEOUS | 3 refills | Status: DC
  Filled 2021-09-09: qty 15, 29d supply, fill #0
  Filled 2021-11-16: qty 45, 88d supply, fill #1

## 2021-09-09 MED ORDER — FREESTYLE LIBRE 2 SENSOR MISC
11 refills | Status: DC
  Filled 2021-09-09: qty 2, 28d supply, fill #0
  Filled 2021-11-17: qty 2, 28d supply, fill #1

## 2021-09-09 MED ORDER — TOUJEO SOLOSTAR 300 UNIT/ML ~~LOC~~ SOPN
PEN_INJECTOR | SUBCUTANEOUS | 3 refills | Status: DC
  Filled 2021-09-09: qty 7.5, 28d supply, fill #0
  Filled 2021-11-16 – 2021-11-17 (×2): qty 7.5, 28d supply, fill #1

## 2021-09-09 MED ORDER — LISINOPRIL 10 MG PO TABS
10.0000 mg | ORAL_TABLET | Freq: Every day | ORAL | 3 refills | Status: DC
  Filled 2021-09-09: qty 90, 90d supply, fill #0

## 2021-09-09 MED ORDER — ONETOUCH VERIO VI STRP
ORAL_STRIP | 3 refills | Status: DC
  Filled 2021-09-23: qty 100, 25d supply, fill #0

## 2021-09-09 MED ORDER — METFORMIN HCL 1000 MG PO TABS
ORAL_TABLET | ORAL | 3 refills | Status: DC
  Filled 2021-09-09: qty 180, 90d supply, fill #0
  Filled 2021-11-16: qty 180, 90d supply, fill #1

## 2021-09-12 ENCOUNTER — Other Ambulatory Visit: Payer: Self-pay

## 2021-09-14 ENCOUNTER — Other Ambulatory Visit: Payer: Self-pay

## 2021-09-14 ENCOUNTER — Ambulatory Visit: Payer: HMO | Admitting: Podiatry

## 2021-09-20 ENCOUNTER — Ambulatory Visit
Admission: RE | Admit: 2021-09-20 | Discharge: 2021-09-20 | Disposition: A | Discharge status: Home / Self Care | Payer: HMO | Source: Ambulatory Visit | Attending: Internal Medicine | Admitting: Internal Medicine

## 2021-09-20 DIAGNOSIS — R928 Other abnormal and inconclusive findings on diagnostic imaging of breast: Secondary | ICD-10-CM

## 2021-09-23 ENCOUNTER — Other Ambulatory Visit: Payer: Self-pay

## 2021-09-26 ENCOUNTER — Encounter: Payer: Self-pay | Admitting: Podiatrist

## 2021-09-26 ENCOUNTER — Other Ambulatory Visit: Payer: Self-pay

## 2021-09-26 ENCOUNTER — Telehealth: Payer: Self-pay | Admitting: Podiatry

## 2021-09-26 ENCOUNTER — Ambulatory Visit (INDEPENDENT_AMBULATORY_CARE_PROVIDER_SITE_OTHER): Payer: HMO | Admitting: Podiatrist

## 2021-09-26 DIAGNOSIS — E0842 Diabetes mellitus due to underlying condition with diabetic polyneuropathy: Secondary | ICD-10-CM | POA: Diagnosis not present

## 2021-09-26 DIAGNOSIS — I70229 Atherosclerosis of native arteries of extremities with rest pain, unspecified extremity: Secondary | ICD-10-CM

## 2021-09-26 DIAGNOSIS — I70291 Other atherosclerosis of native arteries of extremities, right leg: Secondary | ICD-10-CM | POA: Diagnosis not present

## 2021-09-26 DIAGNOSIS — S98112A Complete traumatic amputation of left great toe, initial encounter: Secondary | ICD-10-CM | POA: Diagnosis not present

## 2021-09-26 NOTE — Telephone Encounter (Signed)
Pt called back and I explained that I think she was given incorrect information about the insurance covering 2 prs of diabetic shoes per yr at 90%. I told pt she still has medicare advantage plan as primary insurance and per medicare guidelines they only cover 1 pr of shoes and 3 pairs of inserts per yr. I explained that if the primary insurance denies the claim the secondary usually denies it as well. She stated she did not apply for the [employer] insurance they just kept sending her letters and then she got cards in the mail. They told her she would have to get the shoes by the end of the year. I explained that if we get the shoes and inserts and they do not cover she would get a bill for 604.00. She then asked about medicaid and I told her medicaid does not cover diabetic shoes and inserts thru our office and there are only 2 places that I am aware of that take medicaid for diabetic shoes and inserts. I told her there was one in [place] but the choices of shoes is very minimal and there is one in [place].We agreed to cancel the appt for next week.

## 2021-09-26 NOTE — Progress Notes (Signed)
The patient presented to the office to day to pick up diabetic shoes and 3 pr diabetic custom inserts.  1 pr of  inserts were put in the shoes and the shoes were fitted to the patient.  The patient states they are comfortable and free of defect.  The patient was satisfied with the fit of the shoe.  Instructions for break in and wear were dispensed.  The  delivery documentation and break in instruction forms were signed and a copy of the paperwork was given to the patient.     She has a new insurance ([employer]) and would like to make another appointment for diabetic shoes.  I will ask Dawn to confirm benefits and will call patient.

## 2021-09-26 NOTE — Telephone Encounter (Signed)
I called pt earlier and could not leave a message and she then called back and left message for me to call her.  I Called pt to discuss getting a second pair of diabetic shoes as she told Dr Valentina Lucks at her appt today there her insurance ([employer])told her they would cover 2 pair of diabetic shoes per year at 90%.. Her voicemail has not been set up yet.  I discussed with our billing manager and believe pt was given the wrong information.

## 2021-10-04 ENCOUNTER — Other Ambulatory Visit: Payer: 59

## 2021-10-07 ENCOUNTER — Telehealth: Payer: Self-pay | Admitting: Internal Medicine

## 2021-10-07 NOTE — Telephone Encounter (Signed)
Copied from [place] [id number]. Topic: General - Other >> Oct 07, 2021 10:51 AM Yvette Rack wrote: Reason for CRM: Pt returned call to office from a missed call she had. Attempted to transfer pt to the office but there was no answer. Pt requests call back.  Returning your call

## 2021-10-07 NOTE — Telephone Encounter (Signed)
Returned pt call and made aware that she will need to contact the endocrinologist. Made pt aware that the endocrinologist can help her with her patches. Pt states she understands.

## 2021-10-07 NOTE — Telephone Encounter (Signed)
Copied from [place] [id number]. Topic: General - Other >> Oct 04, 2021 12:40 PM Valere Dross wrote: Reason for CRM: Pt called in about her blood pressure patches and wanted to speak with a nurse about it, please advise.

## 2021-10-07 NOTE — Telephone Encounter (Signed)
Returned pt call pt didn't answer and was unable to lvm due to vm not being setup

## 2021-10-20 ENCOUNTER — Other Ambulatory Visit: Payer: Self-pay | Admitting: Cardiovascular Disease

## 2021-10-20 ENCOUNTER — Ambulatory Visit
Admission: RE | Admit: 2021-10-20 | Discharge: 2021-10-20 | Disposition: A | Discharge status: Home / Self Care | Payer: HMO | Source: Ambulatory Visit | Attending: Internal Medicine | Admitting: Internal Medicine

## 2021-10-20 ENCOUNTER — Other Ambulatory Visit: Payer: Self-pay

## 2021-10-20 DIAGNOSIS — Z780 Asymptomatic menopausal state: Secondary | ICD-10-CM

## 2021-10-21 ENCOUNTER — Telehealth: Payer: Self-pay

## 2021-10-21 NOTE — Telephone Encounter (Signed)
Pt was called and is aware of results, DOB was confirmed.  ?

## 2021-10-21 NOTE — Telephone Encounter (Signed)
-----   Message from Carilyn Goodpasture, RN sent at 10/21/2021 11:42 AM EST -----  ----- Message ----- From: Ladell Pier, MD Sent: 10/20/2021  10:38 PM EST To: Jackelyn Knife, RMA  Bone density study revealed normal bone density.  She does not have osteoporosis.  She will be due again in 3 to 5 years.

## 2021-11-16 ENCOUNTER — Other Ambulatory Visit: Payer: Self-pay

## 2021-11-17 ENCOUNTER — Other Ambulatory Visit: Payer: Self-pay

## 2021-11-18 ENCOUNTER — Other Ambulatory Visit: Payer: Self-pay

## 2021-12-01 DIAGNOSIS — G629 Polyneuropathy, unspecified: Secondary | ICD-10-CM | POA: Diagnosis not present

## 2021-12-08 DIAGNOSIS — G629 Polyneuropathy, unspecified: Secondary | ICD-10-CM | POA: Insufficient documentation

## 2021-12-13 DIAGNOSIS — N182 Chronic kidney disease, stage 2 (mild): Secondary | ICD-10-CM | POA: Diagnosis not present

## 2021-12-13 DIAGNOSIS — E1169 Type 2 diabetes mellitus with other specified complication: Secondary | ICD-10-CM | POA: Diagnosis not present

## 2021-12-13 DIAGNOSIS — E1122 Type 2 diabetes mellitus with diabetic chronic kidney disease: Secondary | ICD-10-CM | POA: Diagnosis not present

## 2021-12-13 DIAGNOSIS — E785 Hyperlipidemia, unspecified: Secondary | ICD-10-CM | POA: Diagnosis not present

## 2021-12-13 DIAGNOSIS — E11649 Type 2 diabetes mellitus with hypoglycemia without coma: Secondary | ICD-10-CM | POA: Diagnosis not present

## 2021-12-13 DIAGNOSIS — I959 Hypotension, unspecified: Secondary | ICD-10-CM | POA: Diagnosis not present

## 2021-12-13 DIAGNOSIS — N3000 Acute cystitis without hematuria: Secondary | ICD-10-CM | POA: Diagnosis not present

## 2021-12-13 DIAGNOSIS — Z794 Long term (current) use of insulin: Secondary | ICD-10-CM | POA: Diagnosis not present

## 2021-12-13 DIAGNOSIS — E1165 Type 2 diabetes mellitus with hyperglycemia: Secondary | ICD-10-CM | POA: Diagnosis not present

## 2021-12-13 DIAGNOSIS — K3184 Gastroparesis: Secondary | ICD-10-CM | POA: Diagnosis not present

## 2021-12-13 DIAGNOSIS — I129 Hypertensive chronic kidney disease with stage 1 through stage 4 chronic kidney disease, or unspecified chronic kidney disease: Secondary | ICD-10-CM | POA: Diagnosis not present

## 2021-12-13 DIAGNOSIS — E1143 Type 2 diabetes mellitus with diabetic autonomic (poly)neuropathy: Secondary | ICD-10-CM | POA: Diagnosis not present

## 2021-12-13 DIAGNOSIS — E1142 Type 2 diabetes mellitus with diabetic polyneuropathy: Secondary | ICD-10-CM | POA: Diagnosis not present

## 2021-12-13 DIAGNOSIS — Z7984 Long term (current) use of oral hypoglycemic drugs: Secondary | ICD-10-CM | POA: Diagnosis not present

## 2021-12-14 ENCOUNTER — Encounter: Payer: Self-pay | Admitting: Gastroenterology

## 2022-02-07 ENCOUNTER — Other Ambulatory Visit: Payer: Self-pay

## 2022-02-07 ENCOUNTER — Ambulatory Visit: Payer: HMO | Attending: Internal Medicine

## 2022-02-07 DIAGNOSIS — Z111 Encounter for screening for respiratory tuberculosis: Secondary | ICD-10-CM

## 2022-02-07 NOTE — Progress Notes (Signed)
Pt arrived for Ppd testing ?PPD placed in left arm ?Pt to return in 48/72 hours for reading. ?

## 2022-02-10 ENCOUNTER — Ambulatory Visit: Payer: HMO | Attending: Internal Medicine

## 2022-02-10 ENCOUNTER — Other Ambulatory Visit: Payer: Self-pay

## 2022-02-10 DIAGNOSIS — Z111 Encounter for screening for respiratory tuberculosis: Secondary | ICD-10-CM

## 2022-02-10 LAB — TB SKIN TEST
Induration: 0 mm
TB Skin Test: NEGATIVE

## 2022-02-10 NOTE — Progress Notes (Signed)
Negative PPD Notes was given to pt ?

## 2022-02-14 ENCOUNTER — Other Ambulatory Visit: Payer: Self-pay

## 2022-02-14 ENCOUNTER — Ambulatory Visit (HOSPITAL_COMMUNITY)
Admission: RE | Admit: 2022-02-14 | Discharge: 2022-02-14 | Disposition: A | Discharge status: Home / Self Care | Payer: Medicare Other | Source: Ambulatory Visit | Attending: Cardiology | Admitting: Cardiology

## 2022-02-14 DIAGNOSIS — I739 Peripheral vascular disease, unspecified: Secondary | ICD-10-CM | POA: Insufficient documentation

## 2022-02-15 ENCOUNTER — Encounter: Payer: Self-pay | Admitting: Podiatry

## 2022-02-15 ENCOUNTER — Ambulatory Visit (INDEPENDENT_AMBULATORY_CARE_PROVIDER_SITE_OTHER): Payer: Medicare Other | Admitting: Podiatry

## 2022-02-15 DIAGNOSIS — M79676 Pain in unspecified toe(s): Secondary | ICD-10-CM | POA: Diagnosis not present

## 2022-02-15 DIAGNOSIS — E0842 Diabetes mellitus due to underlying condition with diabetic polyneuropathy: Secondary | ICD-10-CM

## 2022-02-15 DIAGNOSIS — S98112A Complete traumatic amputation of left great toe, initial encounter: Secondary | ICD-10-CM

## 2022-02-15 DIAGNOSIS — I70229 Atherosclerosis of native arteries of extremities with rest pain, unspecified extremity: Secondary | ICD-10-CM

## 2022-02-15 DIAGNOSIS — B351 Tinea unguium: Secondary | ICD-10-CM

## 2022-02-15 NOTE — Progress Notes (Signed)
This patient returns to my office for at risk foot care.  This patient requires this care by a professional since this patient will be at risk due to having diabetic neuropathy, PVD and amputation left big toe.  This patient is unable to cut nails herself since the patient cannot reach her nails.These nails are painful walking and wearing shoes.  She desires diabetic shoes.   This patient presents for at risk foot care today. ? ?General Appearance  Alert, conversant and in no acute stress. ? ?Vascular  Dorsalis pedis and posterior tibial  pulses are  weakly palpable  bilaterally.  Capillary return is within normal limits  bilaterally. Temperature is within normal limits  bilaterally. ? ?Neurologic  Senn-Weinstein monofilament wire test  diminished  bilaterally. Muscle power within normal limits bilaterally. ? ?Nails Thick disfigured discolored nails with subungual debris  from hallux to fifth toes right foot and 2-5  left foot. No evidence of bacterial infection or drainage bilaterally. ? ?Orthopedic  No limitations of motion  feet .  No crepitus or effusions noted.  No bony pathology or digital deformities noted. Amputation left hallux. ? ?Skin  normotropic skin with no porokeratosis noted bilaterally.  No signs of infections or ulcers noted.    ? ?Onychomycosis  Pain in right toes  Pain in left toes ? ?Consent was obtained for treatment procedures.   Mechanical debridement of nails  bilaterally performed with a nail nipper.  Filed with dremel without incident.  ? ? ?Return office visit   3 months.                    Told patient to return for periodic foot care and evaluation due to potential at risk complications. ? ? ?Gardiner Barefoot DPM   ?

## 2022-02-26 ENCOUNTER — Emergency Department (HOSPITAL_BASED_OUTPATIENT_CLINIC_OR_DEPARTMENT_OTHER): Payer: Medicare Other

## 2022-02-26 ENCOUNTER — Encounter (HOSPITAL_COMMUNITY): Payer: Self-pay | Admitting: Emergency Medicine

## 2022-02-26 ENCOUNTER — Encounter (HOSPITAL_COMMUNITY): Payer: Self-pay

## 2022-02-26 ENCOUNTER — Ambulatory Visit (HOSPITAL_COMMUNITY)
Admission: EM | Admit: 2022-02-26 | Discharge: 2022-02-26 | Disposition: A | Discharge status: Home / Self Care | Payer: Medicare Other

## 2022-02-26 ENCOUNTER — Other Ambulatory Visit: Payer: Self-pay

## 2022-02-26 ENCOUNTER — Emergency Department (HOSPITAL_COMMUNITY): Payer: Medicare Other

## 2022-02-26 ENCOUNTER — Emergency Department (HOSPITAL_COMMUNITY)
Admission: EM | Admit: 2022-02-26 | Discharge: 2022-02-26 | Disposition: A | Discharge status: Home / Self Care | Payer: Medicare Other | Attending: Emergency Medicine | Admitting: Emergency Medicine

## 2022-02-26 DIAGNOSIS — R609 Edema, unspecified: Secondary | ICD-10-CM | POA: Diagnosis not present

## 2022-02-26 DIAGNOSIS — Z794 Long term (current) use of insulin: Secondary | ICD-10-CM | POA: Insufficient documentation

## 2022-02-26 DIAGNOSIS — E1110 Type 2 diabetes mellitus with ketoacidosis without coma: Secondary | ICD-10-CM | POA: Diagnosis not present

## 2022-02-26 DIAGNOSIS — E1140 Type 2 diabetes mellitus with diabetic neuropathy, unspecified: Secondary | ICD-10-CM | POA: Diagnosis not present

## 2022-02-26 DIAGNOSIS — I10 Essential (primary) hypertension: Secondary | ICD-10-CM | POA: Diagnosis not present

## 2022-02-26 DIAGNOSIS — Z7902 Long term (current) use of antithrombotics/antiplatelets: Secondary | ICD-10-CM | POA: Diagnosis not present

## 2022-02-26 DIAGNOSIS — L089 Local infection of the skin and subcutaneous tissue, unspecified: Secondary | ICD-10-CM

## 2022-02-26 DIAGNOSIS — I161 Hypertensive emergency: Secondary | ICD-10-CM | POA: Diagnosis not present

## 2022-02-26 DIAGNOSIS — M79672 Pain in left foot: Secondary | ICD-10-CM | POA: Diagnosis present

## 2022-02-26 DIAGNOSIS — Z7984 Long term (current) use of oral hypoglycemic drugs: Secondary | ICD-10-CM | POA: Insufficient documentation

## 2022-02-26 DIAGNOSIS — Z79899 Other long term (current) drug therapy: Secondary | ICD-10-CM | POA: Diagnosis not present

## 2022-02-26 DIAGNOSIS — L0291 Cutaneous abscess, unspecified: Secondary | ICD-10-CM | POA: Insufficient documentation

## 2022-02-26 DIAGNOSIS — L03116 Cellulitis of left lower limb: Secondary | ICD-10-CM | POA: Insufficient documentation

## 2022-02-26 DIAGNOSIS — S91302A Unspecified open wound, left foot, initial encounter: Secondary | ICD-10-CM | POA: Diagnosis not present

## 2022-02-26 LAB — CBC WITH DIFFERENTIAL/PLATELET
Abs Immature Granulocytes: 0.02 10*3/uL (ref 0.00–0.07)
Basophils Absolute: 0 10*3/uL (ref 0.0–0.1)
Basophils Relative: 0 %
Eosinophils Absolute: 0.2 10*3/uL (ref 0.0–0.5)
Eosinophils Relative: 2 %
HCT: 41.4 % (ref 36.0–46.0)
Hemoglobin: 13 g/dL (ref 12.0–15.0)
Immature Granulocytes: 0 %
Lymphocytes Relative: 13 %
Lymphs Abs: 1.1 10*3/uL (ref 0.7–4.0)
MCH: 27.2 pg (ref 26.0–34.0)
MCHC: 31.4 g/dL (ref 30.0–36.0)
MCV: 86.6 fL (ref 80.0–100.0)
Monocytes Absolute: 0.7 10*3/uL (ref 0.1–1.0)
Monocytes Relative: 8 %
Neutro Abs: 6.4 10*3/uL (ref 1.7–7.7)
Neutrophils Relative %: 77 %
Platelets: 235 10*3/uL (ref 150–400)
RBC: 4.78 MIL/uL (ref 3.87–5.11)
RDW: 12.6 % (ref 11.5–15.5)
WBC: 8.3 10*3/uL (ref 4.0–10.5)
nRBC: 0 % (ref 0.0–0.2)

## 2022-02-26 LAB — COMPREHENSIVE METABOLIC PANEL
ALT: 10 U/L (ref 0–44)
AST: 10 U/L — ABNORMAL LOW (ref 15–41)
Albumin: 3 g/dL — ABNORMAL LOW (ref 3.5–5.0)
Alkaline Phosphatase: 106 U/L (ref 38–126)
Anion gap: 9 (ref 5–15)
BUN: 11 mg/dL (ref 8–23)
CO2: 30 mmol/L (ref 22–32)
Calcium: 9.1 mg/dL (ref 8.9–10.3)
Chloride: 97 mmol/L — ABNORMAL LOW (ref 98–111)
Creatinine, Ser: 0.89 mg/dL (ref 0.44–1.00)
GFR, Estimated: >60 mL/min (ref >60)
Glucose, Bld: 408 mg/dL — ABNORMAL HIGH (ref 70–99)
Potassium: 3.7 mmol/L (ref 3.5–5.1)
Sodium: 136 mmol/L (ref 135–145)
Total Bilirubin: 0.6 mg/dL (ref 0.3–1.2)
Total Protein: 6.8 g/dL (ref 6.5–8.1)

## 2022-02-26 LAB — LACTIC ACID, PLASMA: Lactic Acid, Venous: 1.2 mmol/L (ref 0.5–1.9)

## 2022-02-26 MED ORDER — METRONIDAZOLE 500 MG/100ML IV SOLN
500.0000 mg | Freq: Two times a day (BID) | INTRAVENOUS | Status: DC
  Administered 2022-02-26: 500 mg via INTRAVENOUS
  Filled 2022-02-26: qty 100

## 2022-02-26 MED ORDER — SULFAMETHOXAZOLE-TRIMETHOPRIM 800-160 MG PO TABS
1.0000 | ORAL_TABLET | Freq: Two times a day (BID) | ORAL | 0 refills | Status: AC
Start: 2022-02-26 — End: 2022-03-05

## 2022-02-26 MED ORDER — VANCOMYCIN HCL 2000 MG/400ML IV SOLN
2000.0000 mg | Freq: Once | INTRAVENOUS | Status: AC
Start: 2022-02-26 — End: 2022-02-26
  Administered 2022-02-26: 2000 mg via INTRAVENOUS
  Filled 2022-02-26: qty 400

## 2022-02-26 MED ORDER — SODIUM CHLORIDE 0.9 % IV SOLN: 2.0000 g | Freq: Once | INTRAVENOUS | Status: DC

## 2022-02-26 MED ORDER — VANCOMYCIN HCL 1750 MG/350ML IV SOLN: 1750.0000 mg | INTRAVENOUS | Status: DC

## 2022-02-26 MED ORDER — GABAPENTIN 100 MG PO CAPS: ORAL_CAPSULE | ORAL | 0 refills | Status: DC

## 2022-02-26 MED ORDER — SODIUM CHLORIDE 0.9 % IV SOLN
2.0000 g | Freq: Three times a day (TID) | INTRAVENOUS | Status: DC
  Administered 2022-02-26: 2 g via INTRAVENOUS
  Filled 2022-02-26: qty 12.5

## 2022-02-26 NOTE — ED Notes (Signed)
This RN attempted IV access twice without success, another RN to try ?

## 2022-02-26 NOTE — ED Provider Notes (Signed)
Patient awaiting DVT study and IV antibiotics before being discharged.  Here with left foot infection.  Has follow-up with podiatry already in place for tomorrow.  Given IV antibiotics.  No concern for systemic infection at this time. ? ?DVT study is negative.  Patient tolerated antibiotics well.  She understands follow-up with podiatry tomorrow.  Antibiotics have been sent to pharmacy.  Discharged in good condition. ? ?This chart was dictated using voice recognition software.  Despite best efforts to proofread,  errors can occur which can change the documentation meaning.  ?  Lennice Sites, DO ?02/26/22 1814 ? ?

## 2022-02-26 NOTE — ED Notes (Signed)
Patient transported to x-ray. ?

## 2022-02-26 NOTE — ED Triage Notes (Signed)
Pt reports taking a shower las night and seeing her foot "split open". States she has a hole in her L foot. States her foot has been swollen and states she last checked her sugar 4 days ago.  ?

## 2022-02-26 NOTE — Discharge Instructions (Addendum)
Follow-up with podiatry tomorrow at 11:30 AM.  Take your next dose antibiotic tomorrow morning. ?

## 2022-02-26 NOTE — Discharge Instructions (Signed)
Go to the emergency room as soon as you leave urgent care for further evaluation and management. ?

## 2022-02-26 NOTE — ED Notes (Signed)
Ultrasound at the bedside

## 2022-02-26 NOTE — ED Triage Notes (Signed)
Pt sent from [facility] for infection to L foot x 3-4 days.  States it "split open" while taking a shower last night. ?

## 2022-02-26 NOTE — Progress Notes (Signed)
VASCULAR LAB ? ? ? ?Left lower extremity venous duplex has been performed. ? ?See CV proc for preliminary results. ? ?Gave verbal report to Dr. Ronnald Nian  ? ?Josue Kass, RVT ?02/26/2022, 4:46 PM ? ?

## 2022-02-26 NOTE — ED Provider Notes (Signed)
?[place] ?Provider Note ? ? ?CSN: [id number] ?Arrival date & time: 02/26/22  1244 ? ?  ? ?History ? ?Chief Complaint  ?Patient presents with  ? Foot Pain  ? ? ?Katherine [name] is a 66 y.o. female. ? ?HPI ? ?  ? ?66 year old female with a history of diabetes, hypertension, peripheral vascular disease, hyperlipidemia, diabetic neuropathy who presents with concern for drainage and pain to her left foot.  ? ?She has had pain to the foot for 3-4 days. Had previous great toe amputation, reports she has not looked at her foot since the amputation and does not want to look at it. Pain increasing to foot, worse with walking. Minimal pain at rest but is worse when standing up and walking.  ALso notes some chronic pain for which she takes gabapentin from her neuropathy, and that she does not feel the gabapentin is helping.  Last night while she was taking a shower her foot began to drain /split open.  No numbness, tingling, fever, nausea, vomiting, lightheadedness. No injuries. No chest pain or dyspnea  or hx of DVT. Does note left leg has also been more swollen over last few days.  Reports she has not been following her sugars like she should and is now trying to get things improved with a sugar dr.  ? ?Past Medical History:  ?Diagnosis Date  ? Anemia   ? requiring transfusion  ? Boil of buttock ~ 2016  ? Depression   ? Diabetic neuropathy ([place])   ? Archie Endo 08/23/2017  ? DKA (diabetic ketoacidoses)   ? recent/notes 01/06/2015  ? Hyperlipidemia   ? Hypertension   ? Neuropathy   ? Peripheral vascular disease ([place])   ? Toe ulcer ([place])   ? left great toe/notes 08/23/2017  ? Type II diabetes mellitus ([place])   ?  ?Home Medications ?Prior to Admission medications   ?Medication Sig Start Date End Date Taking? Authorizing Provider  ?gabapentin (NEURONTIN) 100 MG capsule To take in addition to your prescribed '600mg'$  twice daily 02/26/22  Yes Gareth Morgan, MD  ?sulfamethoxazole-trimethoprim (BACTRIM  DS) 800-160 MG tablet Take 1 tablet by mouth 2 (two) times daily for 7 days. 02/26/22 03/05/22 Yes Gareth Morgan, MD  ?amLODipine (NORVASC) 10 MG tablet Take 1 tablet (10 mg total) by mouth daily. 05/03/21   Ladell Pier, MD  ?atorvastatin (LIPITOR) 80 MG tablet Take 1 tablet (80 mg total) by mouth daily at 6 PM. 10/31/17   Lorretta Harp, MD  ?atorvastatin (LIPITOR) 80 MG tablet Take 1 tablet (80 mg total) by mouth daily. 05/03/21   Ladell Pier, MD  ?carvedilol (COREG) 6.25 MG tablet Take 1 tablet (6.25 mg total) by mouth 2 (two) times daily with a meal. 05/03/21   Ladell Pier, MD  ?ciclopirox (PENLAC) 8 % solution Apply topically at bedtime. Apply over nail and surrounding skin. Apply daily over previous coat. After seven (7) days, may remove with alcohol and continue cycle. 05/03/21   Ladell Pier, MD  ?clopidogrel (PLAVIX) 75 MG tablet TAKE 1 TABLET BY MOUTH DAILY WITH BREAKFAST. 05/19/21   Lorretta Harp, MD  ?Continuous Blood Gluc Receiver (FREESTYLE LIBRE 2 READER) DEVI Scan as needed for continuous glucose monitoring. 09/09/21     ?Continuous Blood Gluc Receiver (FREESTYLE LIBRE 2 READER) DEVI Use as directed 3 times daily 05/26/21   Ladell Pier, MD  ?Continuous Blood Gluc Sensor (FREESTYLE LIBRE 2 SENSOR) MISC Inject 1 sensor to the  skin every 14 days for continuous glucose monitoring. 09/09/21     ?Continuous Blood Gluc Sensor (FREESTYLE LIBRE 2 SENSOR) MISC Change sensor every 2 weeks. 05/26/21   Ladell Pier, MD  ?gabapentin (NEURONTIN) 600 MG tablet Take 1 tablet (600 mg total) by mouth 2 (two) times daily. 05/03/21   Ladell Pier, MD  ?glucose blood (ONETOUCH VERIO) test strip 1 each by Misc.(Non-Drug; Combo Route) route 4 times daily before meals and nightly. 09/09/21     ?hydrALAZINE (APRESOLINE) 100 MG tablet Take 1 tablet (100 mg total) by mouth 3 (three) times daily. 05/03/21   Ladell Pier, MD  ?hydrochlorothiazide (HYDRODIURIL) 25 MG tablet Take 1  tablet (25 mg total) by mouth daily. 05/03/21   Ladell Pier, MD  ?insulin aspart (NOVOLOG) 100 UNIT/ML FlexPen Inject 10-17 units into the skin 3 times daily before meals. 09/09/21     ?Insulin Aspart FlexPen (NOVOLOG) 100 UNIT/ML Inject 10-17 Units into the skin 3 times daily before meals. 07/22/21     ?insulin glargine, 1 Unit Dial, (TOUJEO) 300 UNIT/ML Solostar Pen Inject 185 Units into the skin daily. 05/03/21   Ladell Pier, MD  ?Lancets [facility] DELICA PLUS [id number]) MISC Use 4 times daily before meals and nightly. 09/09/21     ?lisinopril (ZESTRIL) 10 MG tablet Take 1 tablet (10 mg total) by mouth daily. 09/09/21     ?lisinopril (ZESTRIL) 5 MG tablet Take 1 tablet (5 mg total) by mouth daily. 05/04/21   Ladell Pier, MD  ?meloxicam (MOBIC) 7.5 MG tablet Take 1 tablet (7.5 mg total) by mouth daily. 06/06/20   Edrick Kins, DPM  ?metFORMIN (GLUCOPHAGE) 1000 MG tablet Take 1 tablet (1,000 mg total) by mouth 2 (two) times daily with a meal. 05/03/21   Ladell Pier, MD  ?metFORMIN (GLUCOPHAGE) 1000 MG tablet Take 1 tablet (1,000 mg total) by mouth 2 times daily with meals. 09/09/21     ?metoCLOPramide (REGLAN) 5 MG tablet Take 1 tablet (5 mg total) by mouth 2 (two) times daily as needed for nausea. 05/03/21   Ladell Pier, MD  ?Multiple Vitamins-Minerals (MULTIVITAMIN ADULT) CHEW Chew 1 tablet by mouth daily.    [provider]  ?TOUJEO SOLOSTAR 300 UNIT/ML Solostar Pen Inject 80 Units into the skin nightly. 09/09/21     ?TRUEPLUS PEN NEEDLES 31G X 5 MM MISC See admin instructions. 06/11/18   [provider]  ?   ? ?Allergies    ?Other and Metformin and related   ? ?Review of Systems   ?Review of Systems ? ?Physical Exam ?Updated Vital Signs ?BP (!) 195/69   Pulse 71   Temp 98.5 ?F (36.9 ?C) (Oral)   Resp 18   Ht 6' (1.829 m)   Wt 101.6 kg   SpO2 97%   BMI 30.38 kg/m?  ?Physical Exam ?Vitals and nursing note reviewed.  ?Constitutional:   ?   General: She is not in  acute distress. ?   Appearance: She is well-developed. She is not diaphoretic.  ?HENT:  ?   Head: Normocephalic and atraumatic.  ?Eyes:  ?   Conjunctiva/sclera: Conjunctivae normal.  ?Cardiovascular:  ?   Rate and Rhythm: Normal rate and regular rhythm.  ?   Heart sounds: Normal heart sounds. No murmur heard. ?  No friction rub. No gallop.  ?Pulmonary:  ?   Effort: Pulmonary effort is normal. No respiratory distress.  ?   Breath sounds: Normal breath sounds. No wheezing or rales.  ?  Abdominal:  ?   General: There is no distension.  ?   Palpations: Abdomen is soft.  ?   Tenderness: There is no abdominal tenderness. There is no guarding.  ?Musculoskeletal:     ?   General: No tenderness.  ?   Cervical back: Normal range of motion.  ?   Left lower leg: Edema present.  ?Skin: ?   General: Skin is warm and dry.  ?   Findings: No erythema or rash.  ?   Comments: Erythema to dorsum of foot on medial side and extending halfway up foot, area on medial volar side of foot with appearance of purulence underlying layer of skin, has 1cm area of open wound, able to express purulence   ?Neurological:  ?   Mental Status: She is alert and oriented to person, place, and time.  ? ? ? ?ED Results / Procedures / Treatments   ?Labs ?(all labs ordered are listed, but only abnormal results are displayed) ?Labs Reviewed  ?COMPREHENSIVE METABOLIC PANEL - Abnormal; Notable for the following components:  ?    Result Value  ? Chloride 97 (*)   ? Glucose, Bld 408 (*)   ? Albumin 3.0 (*)   ? AST 10 (*)   ? All other components within normal limits  ?AEROBIC/ANAEROBIC CULTURE W GRAM STAIN (SURGICAL/DEEP WOUND)  ?CULTURE, BLOOD (ROUTINE X 2)  ?CULTURE, BLOOD (ROUTINE X 2)  ?CBC WITH DIFFERENTIAL/PLATELET  ?LACTIC ACID, PLASMA  ? ? ?EKG ?None ? ?Radiology ?DG Foot Complete Left ? ?Result Date: 02/26/2022 ?CLINICAL DATA:  pain, wound EXAM: LEFT FOOT - COMPLETE 3+ VIEW COMPARISON:  Radiograph dated June 02, 2020 FINDINGS: Status post amputation of the  first phalanx and distal aspect of the first metatarsal. Surgical margins are well corticated. Osteopenia. No area of erosion or osseous destruction. No definite acute fracture or dislocation. Degenerative changes

## 2022-02-26 NOTE — ED Notes (Signed)
Patient is being discharged from the Urgent Care and sent to the Emergency Department via POV . Per [facility], patient is in need of higher level of care due to hypertension. Patient is aware and verbalizes understanding of plan of care.  ?Vitals:  ? 02/26/22 1214  ?BP: (!) 202/79  ?Pulse: 84  ?Resp: 17  ?SpO2: 95%  ? ? ?

## 2022-02-26 NOTE — ED Provider Notes (Signed)
?[facility] ? ? ? ?CSN: [id number] ?Arrival date & time: 02/26/22  1128 ? ? ?  ? ?History   ?Chief Complaint ?Chief Complaint  ?Patient presents with  ? Foot Problem  ? ? ?HPI ?Katherine [name] is a 66 y.o. female.  ? ?Patient presents today due to concerns of "her foot being split open" that she noticed last night while taking a shower.  Denies any associated pain, numbness, tingling.  Patient reports that she had her toe amputated a few years prior on the same foot.  She does report that she is a diabetic but has not taken her insulin in approximately 1 to 2 weeks.  Her blood pressure is also significantly elevated but reports that she has not taken her blood pressure medication in approximately 1 week.  Denies any associated chest pain, headache, dizziness, shortness of breath, blurred vision, nausea, vomiting.  Denies fevers, body aches, chills.  Denies any injury to the foot. ? ? ? ?Past Medical History:  ?Diagnosis Date  ? Anemia   ? requiring transfusion  ? Boil of buttock ~ 2016  ? Depression   ? Diabetic neuropathy ([place])   ? Archie Endo 08/23/2017  ? DKA (diabetic ketoacidoses)   ? recent/notes 01/06/2015  ? Hyperlipidemia   ? Hypertension   ? Neuropathy   ? Peripheral vascular disease ([place])   ? Toe ulcer ([place])   ? left great toe/notes 08/23/2017  ? Type II diabetes mellitus ([place])   ? ? ?Patient Active Problem List  ? Diagnosis Date Noted  ? Amputation of left great toe ([place]) 06/13/2021  ? Microalbuminuria due to type 2 diabetes mellitus ([place]) 05/04/2021  ? Diabetes ([place]) 12/22/2019  ? Renal artery stenosis ([place]) 12/01/2019  ? Diabetic polyneuropathy associated with type 2 diabetes mellitus ([place]) 05/14/2018  ? Thyroid nodule 10/04/2017  ? Microcytic anemia 09/11/2017  ? Critical lower limb ischemia ([place]) 09/10/2017  ? Retinopathy 08/22/2017  ? Thyroid enlargement 07/12/2017  ? Dyslipidemia 09/20/2016  ? DEPRESSION 09/10/2009  ? LEG CRAMPS 05/19/2009  ? GERD 02/15/2009  ? Gastroparesis 09/03/2008  ? DENTAL  CARIES 07/14/2008  ? Essential hypertension 09/16/2003  ? ? ?Past Surgical History:  ?Procedure Laterality Date  ? AMPUTATION TOE Left 10/26/2017  ? Procedure: AMPUTATION PARTIAL RAY LEFT FOOT AND IRRIGATION/DEBRIDEMENT;  Surgeon: Edrick Kins, DPM;  Location: [facility];  Service: Podiatry;  Laterality: Left;  AMPUTATION PARTIAL RAY LEFT FOOT AND IRRIGATION/DEBRIDEMENT  ? CHOLECYSTECTOMY  01/24/2003  ? Archie Endo 04/04/2011  ? COLONOSCOPY    ? INCISION AND DRAINAGE ABSCESS  03/17/2012  ? "right but; left lower abdoment"/notes 03/17/2012  ? LOWER EXTREMITY INTERVENTION N/A 09/10/2017  ? Procedure: LOWER EXTREMITY INTERVENTION;  Surgeon: Lorretta Harp, MD;  Location: [facility];  Service: Cardiovascular;  Laterality: N/A;  ? PERIPHERAL VASCULAR ATHERECTOMY  09/10/2017  ? Procedure: PERIPHERAL VASCULAR ATHERECTOMY;  Surgeon: Lorretta Harp, MD;  Location: [facility];  Service: Cardiovascular;;  left AT  ? ? ?OB History   ?No obstetric history on file. ?  ? ? ? ?Home Medications   ? ?Prior to Admission medications   ?Medication Sig Start Date End Date Taking? Authorizing Provider  ?amLODipine (NORVASC) 10 MG tablet Take 1 tablet (10 mg total) by mouth daily. 05/03/21   Ladell Pier, MD  ?atorvastatin (LIPITOR) 80 MG tablet Take 1 tablet (80 mg total) by mouth daily at 6 PM. 10/31/17   Lorretta Harp, MD  ?atorvastatin (LIPITOR) 80 MG tablet  Take 1 tablet (80 mg total) by mouth daily. 05/03/21   Ladell Pier, MD  ?carvedilol (COREG) 6.25 MG tablet Take 1 tablet (6.25 mg total) by mouth 2 (two) times daily with a meal. 05/03/21   Ladell Pier, MD  ?ciclopirox (PENLAC) 8 % solution Apply topically at bedtime. Apply over nail and surrounding skin. Apply daily over previous coat. After seven (7) days, may remove with alcohol and continue cycle. 05/03/21   Ladell Pier, MD  ?clopidogrel (PLAVIX) 75 MG tablet TAKE 1 TABLET BY MOUTH DAILY WITH BREAKFAST. 05/19/21   Lorretta Harp, MD   ?Continuous Blood Gluc Receiver (FREESTYLE LIBRE 2 READER) DEVI Scan as needed for continuous glucose monitoring. 09/09/21     ?Continuous Blood Gluc Receiver (FREESTYLE LIBRE 2 READER) DEVI Use as directed 3 times daily 05/26/21   Ladell Pier, MD  ?Continuous Blood Gluc Sensor (FREESTYLE LIBRE 2 SENSOR) MISC Inject 1 sensor to the skin every 14 days for continuous glucose monitoring. 09/09/21     ?Continuous Blood Gluc Sensor (FREESTYLE LIBRE 2 SENSOR) MISC Change sensor every 2 weeks. 05/26/21   Ladell Pier, MD  ?gabapentin (NEURONTIN) 600 MG tablet Take 1 tablet (600 mg total) by mouth 2 (two) times daily. 05/03/21   Ladell Pier, MD  ?glucose blood (ONETOUCH VERIO) test strip 1 each by Misc.(Non-Drug; Combo Route) route 4 times daily before meals and nightly. 09/09/21     ?hydrALAZINE (APRESOLINE) 100 MG tablet Take 1 tablet (100 mg total) by mouth 3 (three) times daily. 05/03/21   Ladell Pier, MD  ?hydrochlorothiazide (HYDRODIURIL) 25 MG tablet Take 1 tablet (25 mg total) by mouth daily. 05/03/21   Ladell Pier, MD  ?insulin aspart (NOVOLOG) 100 UNIT/ML FlexPen Inject 10-17 units into the skin 3 times daily before meals. 09/09/21     ?Insulin Aspart FlexPen (NOVOLOG) 100 UNIT/ML Inject 10-17 Units into the skin 3 times daily before meals. 07/22/21     ?insulin glargine, 1 Unit Dial, (TOUJEO) 300 UNIT/ML Solostar Pen Inject 185 Units into the skin daily. 05/03/21   Ladell Pier, MD  ?Lancets [facility] DELICA PLUS [id number]) MISC Use 4 times daily before meals and nightly. 09/09/21     ?lisinopril (ZESTRIL) 10 MG tablet Take 1 tablet (10 mg total) by mouth daily. 09/09/21     ?lisinopril (ZESTRIL) 5 MG tablet Take 1 tablet (5 mg total) by mouth daily. 05/04/21   Ladell Pier, MD  ?meloxicam (MOBIC) 7.5 MG tablet Take 1 tablet (7.5 mg total) by mouth daily. 06/06/20   Edrick Kins, DPM  ?metFORMIN (GLUCOPHAGE) 1000 MG tablet Take 1 tablet (1,000 mg total) by mouth 2 (two) times  daily with a meal. 05/03/21   Ladell Pier, MD  ?metFORMIN (GLUCOPHAGE) 1000 MG tablet Take 1 tablet (1,000 mg total) by mouth 2 times daily with meals. 09/09/21     ?metoCLOPramide (REGLAN) 5 MG tablet Take 1 tablet (5 mg total) by mouth 2 (two) times daily as needed for nausea. 05/03/21   Ladell Pier, MD  ?Multiple Vitamins-Minerals (MULTIVITAMIN ADULT) CHEW Chew 1 tablet by mouth daily.    [provider]  ?TOUJEO SOLOSTAR 300 UNIT/ML Solostar Pen Inject 80 Units into the skin nightly. 09/09/21     ?TRUEPLUS PEN NEEDLES 31G X 5 MM MISC See admin instructions. 06/11/18   [provider]  ? ? ?Family History ?Family History  ?Problem Relation Age of Onset  ? Diabetes Father   ?  Hypertension Father   ? Kidney disease Mother   ? Hypertension Mother   ? Cancer Maternal Grandmother   ? Hypertension Maternal Grandfather   ? Diabetes Paternal Aunt   ? Diabetes Paternal Uncle   ? Diabetes Paternal Grandfather   ? Colon cancer Neg Hx   ? Breast cancer Neg Hx   ? ? ?Social History ?Social History  ? ?Tobacco Use  ? Smoking status: Never  ? Smokeless tobacco: Never  ?Vaping Use  ? Vaping Use: Never used  ?Substance Use Topics  ? Alcohol use: No  ? Drug use: No  ? ? ? ?Allergies   ?Other and Metformin and related ? ? ?Review of Systems ?Review of Systems ?Per HPI ? ?Physical Exam ?Triage Vital Signs ?ED Triage Vitals  ?Enc Vitals Group  ?   BP 02/26/22 1214 (!) 202/79  ?   Pulse Rate 02/26/22 1214 84  ?   Resp 02/26/22 1214 17  ?   Temp --   ?   Temp src --   ?   SpO2 02/26/22 1214 95 %  ?   Weight --   ?   Height --   ?   Head Circumference --   ?   Peak Flow --   ?   Pain Score 02/26/22 1213 5  ?   Pain Loc --   ?   Pain Edu? --   ?   Excl. in [place]? --   ? ?No data found. ? ?Updated Vital Signs ?BP (!) 202/79 (BP Location: Right Arm)   Pulse 84   Resp 17   SpO2 95%  ? ?Visual Acuity ?Right Eye Distance:   ?Left Eye Distance:   ?Bilateral Distance:   ? ?Right Eye Near:   ?Left Eye Near:     ?Bilateral Near:    ? ?Physical Exam ?Constitutional:   ?   General: She is not in acute distress. ?   Appearance: Normal appearance. She is not toxic-appearing or diaphoretic.  ?HENT:  ?   Head: Normocephalic and at

## 2022-02-26 NOTE — Progress Notes (Signed)
Pharmacy Antibiotic Note ? ?Katherine [name] is a 66 y.o. female for which pharmacy has been consulted for cefepime and vancomycin dosing for  wound infection . ? ?Patient with a history of anemia, depression, diabetic neuropathy, DKA, HLD, HTN, PVD, toe ulcer, T2DM. Patient presenting from [facility] for infection to L foot x 3-4 days. ? ?SCr 0.89 - at baseline ?WBC 8.3; LA 1.2; T 98.5 F; HR 69; RR 18 ? ?Plan: ?Metronidazole per MD ?Cefepime 2g q8hr ?Vancomycin 2000 mg once then 1750 mg q24hr (eAUC 531) unless change in renal function ?Trend WBC, Fever, Renal function, & Clinical course ?F/u cultures, clinical course, WBC, fever ?De-escalate when able ?Levels at steady state ? ?Height: 6' (182.9 cm) ?Weight: 101.6 kg (224 lb) ?IBW/kg (Calculated) : 73.1 ? ?Temp (24hrs), Avg:98.4 ?F (36.9 ?C), Min:98.2 ?F (36.8 ?C), Max:98.5 ?F (36.9 ?C) ? ?No results for input(s): WBC, CREATININE, LATICACIDVEN, VANCOTROUGH, VANCOPEAK, VANCORANDOM, GENTTROUGH, GENTPEAK, GENTRANDOM, TOBRATROUGH, TOBRAPEAK, TOBRARND, AMIKACINPEAK, AMIKACINTROU, AMIKACIN in the last 168 hours.  ?CrCl cannot be calculated (Patient's most recent lab result is older than the maximum 21 days allowed.).   ? ?Allergies  ?Allergen Reactions  ? Other Swelling  ?  Seaweed= swelling on arms, hands and face  ? Metformin And Related Nausea Only  ? ? ?Antimicrobials this admission: ?cefepime 4/9 >>  ?metronidazole 4/9 >>  ?vancomycin 4/9 >>  ? ?Microbiology results: ?Pending ? ?Thank you for allowing pharmacy to be a part of this patient?s care. ? ?Lorelei Pont, PharmD, BCPS ?02/26/2022 2:46 PM ?ED Clinical Pharmacist -  [phone] ?  ?

## 2022-02-27 ENCOUNTER — Ambulatory Visit (INDEPENDENT_AMBULATORY_CARE_PROVIDER_SITE_OTHER): Payer: Medicare Other | Admitting: Podiatry

## 2022-02-27 DIAGNOSIS — E11628 Type 2 diabetes mellitus with other skin complications: Secondary | ICD-10-CM

## 2022-02-27 DIAGNOSIS — E08621 Diabetes mellitus due to underlying condition with foot ulcer: Secondary | ICD-10-CM

## 2022-02-27 DIAGNOSIS — L03119 Cellulitis of unspecified part of limb: Secondary | ICD-10-CM

## 2022-02-27 DIAGNOSIS — L089 Local infection of the skin and subcutaneous tissue, unspecified: Secondary | ICD-10-CM | POA: Diagnosis not present

## 2022-02-27 DIAGNOSIS — L97425 Non-pressure chronic ulcer of left heel and midfoot with muscle involvement without evidence of necrosis: Secondary | ICD-10-CM

## 2022-02-27 NOTE — Progress Notes (Signed)
?  Subjective:  ?Patient ID: Katherine [name], female    DOB: 1956/10/01,  MRN: [id number] ? ?Chief Complaint  ?Patient presents with  ? Diabetes  ?   Per Dr Sherryle Lis - ER Follow up from 02/26/22, left foot infection, on abx  ? ? ?66 y.o. female presents with the above complaint. History confirmed with patient.  She is here for an urgent follow-up on a left foot infection that developed over the weekend she was seen in the emergency room yesterday.  They gave her doses of IV antibiotics and prescribed her Bactrim she says she has not started taking this.  She has not looked at her foot yet, she says she has not looked at her foot since she lost her great toe a few years ago because she cannot stand the sight of it.  Says it is painful to put pressure on.  Denies nausea fever chills vomiting ? ?Objective:  ?Physical Exam: ?warm, good capillary refill, normal DP and PT pulses, and she has an abnormal sensory exam with loss of protective sensation and diffuse neuropathy, there is a large cellulitic area on the left lateral dorsal foot, this correlates to submetatarsal 5 ulceration full-thickness that probes to the level of tendon and capsule, no exposed bone, mild purulence with large blistering around this.  It measures approximately 0.8 x 0.8 x 0.8 cm. ? ?Culture taken in ER yesterday preliminary report shows gram-positive cocci  ? ?Radiographs: ?Multiple views x-ray of the left foot: I reviewed her radiographs from the ER yesterday there is no significant erosions or sign of a gas-forming infection ?Assessment:  ? ?1. Type 2 diabetes mellitus with diabetic foot infection ([place])   ?2. Diabetic ulcer of left midfoot associated with diabetes mellitus due to underlying condition, with muscle involvement without evidence of necrosis ([place])   ?3. Cellulitis and abscess of foot, except toes   ? ? ? ?Plan:  ?Patient was evaluated and treated and all questions answered. ? ?Ulcer left foot ?-We discussed the etiology and factors that  are a part of the wound healing process.  We also discussed the risk of infection both soft tissue and osteomyelitis from open ulceration.  Discussed the risk of limb loss if this happens or worsens. ?-Debridement as below. ?-Dressed with iodinated gauze packing, DSD. ?-Continue home dressing changes  3 times weekly with iodinated packing and gauze dry dressings with silver alginate ?-Continue off-loading with surgical shoe and peg assist device.  These were dispensed today to protect the ulcer and offload it ?-Vascular testing currently not indicated ?-HgbA1c: 10.3% she has significant uncontrolled diabetes I discussed with her the impact of this on wound healing and risk of limb loss with this. ?-Last antibiotics: IV antibiotics received yesterday she did start taking the Bactrim ?-Imaging: x-ray reviewed, shows no signs of erosions, osteolysis, osteomyelitis or emphysema. ? ?Procedure: Excisional Debridement of Wound ?Rationale: Removal of non-viable soft tissue from the wound to promote healing.  ?Anesthesia: none ?Post-Debridement Wound Measurements: 0.8 cm x 0.8 cm x 0.8 cm  ?Type of Debridement: Sharp Excisional ?Tissue Removed: Non-viable soft tissue ?Depth of Debridement: subcutaneous tissue. ?Technique: Sharp excisional debridement to bleeding, viable wound base.  ?Dressing: Dry, sterile, compression dressing. ?Disposition: Patient tolerated procedure well.  ? ? ?No follow-ups on file. ? ?   ? ? ?No follow-ups on file.  ? ?

## 2022-02-27 NOTE — Patient Instructions (Signed)
WEAR THE BOOT WHEN WALKING ? ?PICK UP YOUR ANTIBIOTICS TODAY AND BEGIN TAKING IMMEDIATELY ? ?IF YOU EXPERIENCE ANY FEVER, CHILLS, NIGHTSWEATS, NAUSEA OR VOMITING, ELEVATED OR LOW BLOOD SUGARS, REPORT TO EMERGENCY ROOM. ? ?IF YOU EXPERIENCE INCREASED REDNESS, PAIN, SWELLING, DISCOLORATION, ODOR, PUS, DRAINAGE OR WARMTH OF YOUR FOOT, REPORT TO EMERGENCY ROOM.  ?

## 2022-03-01 ENCOUNTER — Telehealth (HOSPITAL_BASED_OUTPATIENT_CLINIC_OR_DEPARTMENT_OTHER): Payer: Self-pay | Admitting: Emergency Medicine

## 2022-03-01 LAB — BLOOD CULTURE ID PANEL (REFLEXED) - BCID2

## 2022-03-01 MED ORDER — CIPROFLOXACIN HCL 500 MG PO TABS: 500.0000 mg | ORAL_TABLET | Freq: Two times a day (BID) | ORAL | 0 refills | Status: DC

## 2022-03-01 NOTE — Telephone Encounter (Signed)
I was notified by the nurse regarding blood culture.  Patient has positive blood culture on day 3.  However there is none on the ID.  I think the blood culture is likely a contamination.  However patient does have a wound culture that was sent and had Klebsiella and Staph aureus.  She was prescribed Bactrim but the Klebsiella is resistant to Bactrim.  Both staph and Klebsiella are sensitive to Cipro. I prescribed Cipro for patient.  I had the nurse contact patient and the patient is not running fever she does not need to return to the ER.  She just needs to take Cipro as prescribed and follow-up with her wound care doctor. ?

## 2022-03-01 NOTE — ED Notes (Signed)
Per ED MD, Dr Darl Householder, MD, pt was phoned and informed that current abx ordered has been discontinued and a new Rx for Cipro was electronically sent to the pharmacy listed on her EMR. Informed pt that her abx has been changed and that she should stop taking the Bactrim and begin the Cipro PO. Opportunity for questions provided prior to ending phone conversation.  ?

## 2022-03-01 NOTE — ED Notes (Signed)
Phone call rec from [facility] regarding 1 out of 3 bottles Aerobic gram + cocci in clusters, information was provided to the attending ED MD (Dr. Darl Householder) for evaluation and further orders ?

## 2022-03-02 ENCOUNTER — Ambulatory Visit (INDEPENDENT_AMBULATORY_CARE_PROVIDER_SITE_OTHER): Payer: Medicare Other

## 2022-03-02 ENCOUNTER — Other Ambulatory Visit: Payer: Self-pay

## 2022-03-02 DIAGNOSIS — E11628 Type 2 diabetes mellitus with other skin complications: Secondary | ICD-10-CM

## 2022-03-02 DIAGNOSIS — L089 Local infection of the skin and subcutaneous tissue, unspecified: Secondary | ICD-10-CM

## 2022-03-02 MED ORDER — TRAMADOL HCL 50 MG PO TABS
50.0000 mg | ORAL_TABLET | Freq: Four times a day (QID) | ORAL | 0 refills | Status: AC | PRN
  Filled 2022-03-02: qty 20, 5d supply, fill #0

## 2022-03-02 NOTE — Progress Notes (Signed)
Ulcer left foot. ?-Dressed with iodinated gauze packing, DSD. ?-Continue home dressing changes  3 times weekly with iodinated packing and gauze dry dressings with silver alginate ?-Continue off-loading with surgical shoe and peg assist device.  ?

## 2022-03-03 LAB — AEROBIC/ANAEROBIC CULTURE W GRAM STAIN (SURGICAL/DEEP WOUND): Gram Stain: NONE SEEN

## 2022-03-03 LAB — CULTURE, BLOOD (ROUTINE X 2)
Culture: NO GROWTH
Special Requests: ADEQUATE

## 2022-03-04 ENCOUNTER — Telehealth (HOSPITAL_BASED_OUTPATIENT_CLINIC_OR_DEPARTMENT_OTHER): Payer: Self-pay | Admitting: *Deleted

## 2022-03-04 NOTE — Telephone Encounter (Signed)
Post ED Visit - Positive Culture Follow-up ? ?Culture report reviewed by antimicrobial stewardship pharmacist: ?[facility] Team ?'[]'$  Elenor Quinones, Pharm.D. ?'[]'$  Heide Guile, Pharm.D., BCPS AQ-ID ?'[]'$  Parks Neptune, Pharm.D., BCPS ?'[]'$  Alycia Rossetti, Pharm.D., BCPS ?'[]'$  Minh Pham, Pharm.D., BCPS, AAHIVP ?'[]'$  Legrand Como, Pharm.D., BCPS, AAHIVP ?'[]'$  Salome Arnt, PharmD, BCPS ?'[]'$  Johnnette Gourd, PharmD, BCPS ?'[]'$  Hughes Better, PharmD, BCPS ?'[]'$  Leeroy Cha, PharmD ?'[]'$  Laqueta Linden, PharmD, BCPS ?'[x]'$  Albertina Parr, PharmD ? ?[facility] Team ?'[]'$  Leodis Sias, PharmD ?'[]'$  Lindell Spar, PharmD ?'[]'$  Royetta Asal, PharmD ?'[]'$  Graylin Shiver, Rph ?'[]'$  Rema Fendt) Glennon Mac, PharmD ?'[]'$  Arlyn Dunning, PharmD ?'[]'$  Netta Cedars, PharmD ?'[]'$  Dia Sitter, PharmD ?'[]'$  Leone Haven, PharmD ?'[]'$  Gretta Arab, PharmD ?'[]'$  Theodis Shove, PharmD ?'[]'$  Peggyann Juba, PharmD ?'[]'$  Reuel Boom, PharmD ? ? ?Positive Blood culture/ Aerobic/anerobic  culture with gram stain ?Treated with Sulfamethoxazole-Trimethoprim + Cipro, organism sensitive to the same and no further patient follow-up is required at this time per Dr. Harrell Gave Tegeler ? ?Rosie Fate ?03/04/2022, 11:02 AM ?  ?

## 2022-03-06 ENCOUNTER — Telehealth: Payer: Self-pay | Admitting: Podiatry

## 2022-03-06 NOTE — Telephone Encounter (Signed)
Patient called the nurse line stating she has a question but didn't give any further details.  ?

## 2022-03-07 ENCOUNTER — Telehealth: Payer: Self-pay | Admitting: Podiatry

## 2022-03-07 NOTE — Telephone Encounter (Signed)
Pt was to have home health come out to help with dressing changes and has not heard anything. Could you please call pt with information on who the home health agency is or call them to get them to call pt. ?

## 2022-03-08 ENCOUNTER — Other Ambulatory Visit: Payer: Self-pay

## 2022-03-08 ENCOUNTER — Ambulatory Visit (INDEPENDENT_AMBULATORY_CARE_PROVIDER_SITE_OTHER): Payer: Medicare Other

## 2022-03-08 DIAGNOSIS — L03119 Cellulitis of unspecified part of limb: Secondary | ICD-10-CM

## 2022-03-08 DIAGNOSIS — L02619 Cutaneous abscess of unspecified foot: Secondary | ICD-10-CM

## 2022-03-08 NOTE — Progress Notes (Signed)
Ulcer left foot. ?-Dressed with iodinated gauze packing, DSD. ?-Continue home dressing changes  3 times weekly with iodinated packing and gauze dry dressings with silver alginate ?-Continue off-loading with surgical shoe and peg assist device.  ? ?Patient denies nausea, vomiting, fever and chills. Dr. Blenda Mounts looked at the wound and confirmed it appears to be healing well. Will continue dressing changes until home health is established.  ?

## 2022-03-08 NOTE — Telephone Encounter (Signed)
Called [facility] and spoke with Urban Gibson, said that the referral was a non admit due to staffing.  ?I will fax the order to [facility] today. ?

## 2022-03-10 ENCOUNTER — Ambulatory Visit (INDEPENDENT_AMBULATORY_CARE_PROVIDER_SITE_OTHER): Payer: Medicare Other

## 2022-03-10 DIAGNOSIS — L02619 Cutaneous abscess of unspecified foot: Secondary | ICD-10-CM

## 2022-03-10 DIAGNOSIS — L03119 Cellulitis of unspecified part of limb: Secondary | ICD-10-CM

## 2022-03-10 NOTE — Progress Notes (Signed)
Ulcer left foot. ?-Dressed with iodinated gauze packing, DSD. ?-Continue home dressing changes  3 times weekly with iodinated packing and gauze dry dressings with silver alginate ?-Continue off-loading with surgical shoe and peg assist device.  ? ?Patient denies nausea, vomiting, fever and chills.Wound appears to be healing well. Will continue dressing changes until home health is established.  ?

## 2022-03-14 ENCOUNTER — Ambulatory Visit (INDEPENDENT_AMBULATORY_CARE_PROVIDER_SITE_OTHER): Payer: Medicare Other

## 2022-03-14 DIAGNOSIS — E1122 Type 2 diabetes mellitus with diabetic chronic kidney disease: Secondary | ICD-10-CM | POA: Diagnosis not present

## 2022-03-14 DIAGNOSIS — L03119 Cellulitis of unspecified part of limb: Secondary | ICD-10-CM

## 2022-03-14 DIAGNOSIS — I129 Hypertensive chronic kidney disease with stage 1 through stage 4 chronic kidney disease, or unspecified chronic kidney disease: Secondary | ICD-10-CM | POA: Diagnosis not present

## 2022-03-14 DIAGNOSIS — Z7984 Long term (current) use of oral hypoglycemic drugs: Secondary | ICD-10-CM | POA: Diagnosis not present

## 2022-03-14 DIAGNOSIS — E559 Vitamin D deficiency, unspecified: Secondary | ICD-10-CM | POA: Diagnosis not present

## 2022-03-14 DIAGNOSIS — E1151 Type 2 diabetes mellitus with diabetic peripheral angiopathy without gangrene: Secondary | ICD-10-CM | POA: Diagnosis not present

## 2022-03-14 DIAGNOSIS — E785 Hyperlipidemia, unspecified: Secondary | ICD-10-CM | POA: Diagnosis not present

## 2022-03-14 DIAGNOSIS — E1169 Type 2 diabetes mellitus with other specified complication: Secondary | ICD-10-CM | POA: Diagnosis not present

## 2022-03-14 DIAGNOSIS — L02619 Cutaneous abscess of unspecified foot: Secondary | ICD-10-CM

## 2022-03-14 DIAGNOSIS — E1165 Type 2 diabetes mellitus with hyperglycemia: Secondary | ICD-10-CM | POA: Diagnosis not present

## 2022-03-14 DIAGNOSIS — N182 Chronic kidney disease, stage 2 (mild): Secondary | ICD-10-CM | POA: Diagnosis not present

## 2022-03-14 DIAGNOSIS — Z794 Long term (current) use of insulin: Secondary | ICD-10-CM | POA: Diagnosis not present

## 2022-03-14 NOTE — Progress Notes (Signed)
Ulcer left foot. ?-Dressed with iodinated gauze packing, DSD. ?-Continue home dressing changes  3 times weekly with iodinated packing and gauze dry dressings with silver alginate ?-Continue off-loading with surgical shoe and peg assist device.  ? ?Patient denies nausea, vomiting, fever and chills.Wound appears to be healing well. Will continue dressing changes until home health is established.  ?

## 2022-03-16 ENCOUNTER — Ambulatory Visit (INDEPENDENT_AMBULATORY_CARE_PROVIDER_SITE_OTHER): Payer: Medicare Other | Admitting: Podiatry

## 2022-03-16 DIAGNOSIS — L97425 Non-pressure chronic ulcer of left heel and midfoot with muscle involvement without evidence of necrosis: Secondary | ICD-10-CM

## 2022-03-16 DIAGNOSIS — E08621 Diabetes mellitus due to underlying condition with foot ulcer: Secondary | ICD-10-CM

## 2022-03-19 NOTE — Progress Notes (Signed)
?  Subjective:  ?Patient ID: Katherine [name], female    DOB: 19-Jan-1956,  MRN: [id number] ? ?Chief Complaint  ?Patient presents with  ? Diabetic Ulcer  ?  Left foot wound care  ? ? ?66 y.o. female presents with the above complaint. History confirmed with patient.  She is here for an urgent follow-up on a left foot infection that developed over the weekend she was seen in the emergency room yesterday.  They gave her doses of IV antibiotics and prescribed her Bactrim she says she has not started taking this.  She has not looked at her foot yet, she says she has not looked at her foot since she lost her great toe a few years ago because she cannot stand the sight of it.  Says it is painful to put pressure on.  Denies nausea fever chills vomiting ? ?Interval History: ?Since last visit she has been seen by our nurse for dressing changes seems that is doing well ? ?Objective:  ?Physical Exam: ?warm, good capillary refill, normal DP and PT pulses, and she has an abnormal sensory exam with loss of protective sensation and diffuse neuropathy, there is a lulceration full-thickness that probes to the level of tendon and capsule, no exposed bone, no cellulitis or purulence now.  It measures approximately 0.8 x 0.8 x 0.8 cm. ? ? ? ?Culture taken in ER yesterday preliminary report shows gram-positive cocci  ? ?Radiographs: ?Multiple views x-ray of the left foot: I reviewed her radiographs from the ER yesterday there is no significant erosions or sign of a gas-forming infection ?Assessment:  ? ?1. Diabetic ulcer of left midfoot associated with diabetes mellitus due to underlying condition, with muscle involvement without evidence of necrosis ([place])   ? ? ? ?Plan:  ?Patient was evaluated and treated and all questions answered. ? ?Ulcer left foot ?-We discussed the etiology and factors that are a part of the wound healing process.  We also discussed the risk of infection both soft tissue and osteomyelitis from open ulceration.  Discussed  the risk of limb loss if this happens or worsens. ?-Debridement as below. ?-Dressed with iodinated gauze packing, DSD. ?-Continue home dressing changes  3 times weekly with Prisma ?-Continue off-loading with surgical shoe and peg assist device.  These were dispensed today to protect the ulcer and offload it ?-Vascular testing currently not indicated ?-HgbA1c: 10.3% she has significant uncontrolled diabetes I discussed with her the impact of this on wound healing and risk of limb loss with this. ?-Last antibiotics: No further antibiotics at this point ?-Imaging: x-ray reviewed, shows no signs of erosions, osteolysis, osteomyelitis or emphysema. ? ?Procedure: Excisional Debridement of Wound ?Rationale: Removal of non-viable soft tissue from the wound to promote healing.  ?Anesthesia: none ?Post-Debridement Wound Measurements: 0.8 cm x 0.8 cm x 0.8 cm  ?Type of Debridement: Sharp Excisional ?Tissue Removed: Non-viable soft tissue ?Depth of Debridement: subcutaneous tissue. ?Technique: Sharp excisional debridement to bleeding, viable wound base.  ?Dressing: Dry, sterile, compression dressing. ?Disposition: Patient tolerated procedure well.  ? ? ?   ? ? ?Return in about 3 weeks (around 04/06/2022) for wound care.  ? ?

## 2022-03-20 ENCOUNTER — Ambulatory Visit (INDEPENDENT_AMBULATORY_CARE_PROVIDER_SITE_OTHER): Payer: Medicare Other

## 2022-03-20 DIAGNOSIS — E08621 Diabetes mellitus due to underlying condition with foot ulcer: Secondary | ICD-10-CM

## 2022-03-20 DIAGNOSIS — L97425 Non-pressure chronic ulcer of left heel and midfoot with muscle involvement without evidence of necrosis: Secondary | ICD-10-CM

## 2022-03-20 NOTE — Telephone Encounter (Signed)
PT NEEDS REFILL FOR GABAPENTIN '100MG'$  ?

## 2022-03-20 NOTE — Progress Notes (Signed)
Ulcer left foot. ?-Dressed with iodinated gauze packing, DSD. ?-Continue home dressing changes  3 times weekly with iodinated packing and gauze dry dressings with silver alginate ?-Continue off-loading with surgical shoe and peg assist device.  ?  ?Patient denies nausea, vomiting, fever and chills.Wound appears to be healing well. Will continue dressing changes until home health is established.  ?

## 2022-03-23 ENCOUNTER — Ambulatory Visit (INDEPENDENT_AMBULATORY_CARE_PROVIDER_SITE_OTHER): Payer: Medicare Other

## 2022-03-23 ENCOUNTER — Other Ambulatory Visit: Payer: Medicare Other

## 2022-03-23 DIAGNOSIS — E08621 Diabetes mellitus due to underlying condition with foot ulcer: Secondary | ICD-10-CM

## 2022-03-23 DIAGNOSIS — L97425 Non-pressure chronic ulcer of left heel and midfoot with muscle involvement without evidence of necrosis: Secondary | ICD-10-CM

## 2022-03-23 NOTE — Progress Notes (Signed)
Ulcer left foot. ?-Dressed with prisma, DSD. ?-Continue home dressing changes  3 times weekly with prisma and gauze dry dressings.  ?-Continue off-loading with surgical shoe and peg assist device.  ?  ?Patient denies nausea, vomiting, fever and chills.Wound appears to be healing well. Will continue dressing changes until home health is established.  ?

## 2022-03-27 ENCOUNTER — Ambulatory Visit: Payer: Medicare Other | Admitting: Podiatry

## 2022-03-28 DIAGNOSIS — Z794 Long term (current) use of insulin: Secondary | ICD-10-CM | POA: Diagnosis not present

## 2022-03-28 DIAGNOSIS — E1165 Type 2 diabetes mellitus with hyperglycemia: Secondary | ICD-10-CM | POA: Diagnosis not present

## 2022-03-28 DIAGNOSIS — Z7984 Long term (current) use of oral hypoglycemic drugs: Secondary | ICD-10-CM | POA: Diagnosis not present

## 2022-03-29 ENCOUNTER — Emergency Department (HOSPITAL_COMMUNITY): Payer: Medicare Other

## 2022-03-29 ENCOUNTER — Encounter: Payer: Self-pay | Admitting: Podiatry

## 2022-03-29 ENCOUNTER — Encounter (HOSPITAL_COMMUNITY): Payer: Self-pay | Admitting: Emergency Medicine

## 2022-03-29 ENCOUNTER — Ambulatory Visit (INDEPENDENT_AMBULATORY_CARE_PROVIDER_SITE_OTHER): Payer: Medicare Other | Admitting: Podiatry

## 2022-03-29 ENCOUNTER — Inpatient Hospital Stay (HOSPITAL_COMMUNITY): Payer: Medicare Other

## 2022-03-29 ENCOUNTER — Inpatient Hospital Stay (HOSPITAL_COMMUNITY)
Admission: EM | Admit: 2022-03-29 | Discharge: 2022-04-01 | DRG: 300 | Disposition: A | Discharge status: Home / Self Care | Payer: Medicare Other | Source: Ambulatory Visit | Attending: Internal Medicine | Admitting: Internal Medicine

## 2022-03-29 ENCOUNTER — Ambulatory Visit (INDEPENDENT_AMBULATORY_CARE_PROVIDER_SITE_OTHER): Payer: Medicare Other

## 2022-03-29 ENCOUNTER — Other Ambulatory Visit: Payer: Self-pay

## 2022-03-29 VITALS — BP 166/57 | HR 64 | Temp 98.7°F

## 2022-03-29 DIAGNOSIS — L97526 Non-pressure chronic ulcer of other part of left foot with bone involvement without evidence of necrosis: Secondary | ICD-10-CM | POA: Diagnosis present

## 2022-03-29 DIAGNOSIS — E1143 Type 2 diabetes mellitus with diabetic autonomic (poly)neuropathy: Secondary | ICD-10-CM | POA: Diagnosis present

## 2022-03-29 DIAGNOSIS — E785 Hyperlipidemia, unspecified: Secondary | ICD-10-CM | POA: Diagnosis not present

## 2022-03-29 DIAGNOSIS — I10 Essential (primary) hypertension: Secondary | ICD-10-CM | POA: Diagnosis not present

## 2022-03-29 DIAGNOSIS — E1122 Type 2 diabetes mellitus with diabetic chronic kidney disease: Secondary | ICD-10-CM | POA: Diagnosis present

## 2022-03-29 DIAGNOSIS — Z888 Allergy status to other drugs, medicaments and biological substances status: Secondary | ICD-10-CM | POA: Diagnosis not present

## 2022-03-29 DIAGNOSIS — Z809 Family history of malignant neoplasm, unspecified: Secondary | ICD-10-CM | POA: Diagnosis not present

## 2022-03-29 DIAGNOSIS — Z7984 Long term (current) use of oral hypoglycemic drugs: Secondary | ICD-10-CM | POA: Diagnosis not present

## 2022-03-29 DIAGNOSIS — E1142 Type 2 diabetes mellitus with diabetic polyneuropathy: Secondary | ICD-10-CM | POA: Diagnosis present

## 2022-03-29 DIAGNOSIS — Z841 Family history of disorders of kidney and ureter: Secondary | ICD-10-CM | POA: Diagnosis not present

## 2022-03-29 DIAGNOSIS — E1169 Type 2 diabetes mellitus with other specified complication: Secondary | ICD-10-CM | POA: Diagnosis not present

## 2022-03-29 DIAGNOSIS — L03116 Cellulitis of left lower limb: Secondary | ICD-10-CM

## 2022-03-29 DIAGNOSIS — Z79899 Other long term (current) drug therapy: Secondary | ICD-10-CM

## 2022-03-29 DIAGNOSIS — Z794 Long term (current) use of insulin: Secondary | ICD-10-CM

## 2022-03-29 DIAGNOSIS — M86172 Other acute osteomyelitis, left ankle and foot: Secondary | ICD-10-CM | POA: Diagnosis not present

## 2022-03-29 DIAGNOSIS — E11621 Type 2 diabetes mellitus with foot ulcer: Secondary | ICD-10-CM | POA: Diagnosis present

## 2022-03-29 DIAGNOSIS — K3184 Gastroparesis: Secondary | ICD-10-CM | POA: Diagnosis present

## 2022-03-29 DIAGNOSIS — M869 Osteomyelitis, unspecified: Secondary | ICD-10-CM | POA: Diagnosis not present

## 2022-03-29 DIAGNOSIS — L97425 Non-pressure chronic ulcer of left heel and midfoot with muscle involvement without evidence of necrosis: Secondary | ICD-10-CM

## 2022-03-29 DIAGNOSIS — M86672 Other chronic osteomyelitis, left ankle and foot: Secondary | ICD-10-CM | POA: Diagnosis not present

## 2022-03-29 DIAGNOSIS — I70202 Unspecified atherosclerosis of native arteries of extremities, left leg: Secondary | ICD-10-CM | POA: Diagnosis present

## 2022-03-29 DIAGNOSIS — Z7902 Long term (current) use of antithrombotics/antiplatelets: Secondary | ICD-10-CM

## 2022-03-29 DIAGNOSIS — Z89412 Acquired absence of left great toe: Secondary | ICD-10-CM

## 2022-03-29 DIAGNOSIS — Z8249 Family history of ischemic heart disease and other diseases of the circulatory system: Secondary | ICD-10-CM

## 2022-03-29 DIAGNOSIS — E1165 Type 2 diabetes mellitus with hyperglycemia: Secondary | ICD-10-CM | POA: Diagnosis present

## 2022-03-29 DIAGNOSIS — M7989 Other specified soft tissue disorders: Secondary | ICD-10-CM | POA: Diagnosis not present

## 2022-03-29 DIAGNOSIS — I129 Hypertensive chronic kidney disease with stage 1 through stage 4 chronic kidney disease, or unspecified chronic kidney disease: Secondary | ICD-10-CM

## 2022-03-29 DIAGNOSIS — Z833 Family history of diabetes mellitus: Secondary | ICD-10-CM

## 2022-03-29 DIAGNOSIS — N182 Chronic kidney disease, stage 2 (mild): Secondary | ICD-10-CM | POA: Diagnosis not present

## 2022-03-29 DIAGNOSIS — E1151 Type 2 diabetes mellitus with diabetic peripheral angiopathy without gangrene: Principal | ICD-10-CM | POA: Diagnosis present

## 2022-03-29 DIAGNOSIS — M609 Myositis, unspecified: Secondary | ICD-10-CM | POA: Diagnosis not present

## 2022-03-29 DIAGNOSIS — I739 Peripheral vascular disease, unspecified: Secondary | ICD-10-CM | POA: Diagnosis not present

## 2022-03-29 DIAGNOSIS — E08621 Diabetes mellitus due to underlying condition with foot ulcer: Secondary | ICD-10-CM

## 2022-03-29 DIAGNOSIS — L97509 Non-pressure chronic ulcer of other part of unspecified foot with unspecified severity: Secondary | ICD-10-CM | POA: Diagnosis not present

## 2022-03-29 DIAGNOSIS — L0390 Cellulitis, unspecified: Secondary | ICD-10-CM | POA: Diagnosis not present

## 2022-03-29 DIAGNOSIS — S91302A Unspecified open wound, left foot, initial encounter: Secondary | ICD-10-CM | POA: Diagnosis not present

## 2022-03-29 LAB — CBC WITH DIFFERENTIAL/PLATELET
Abs Immature Granulocytes: 0.01 10*3/uL (ref 0.00–0.07)
Basophils Absolute: 0 10*3/uL (ref 0.0–0.1)
Basophils Relative: 0 %
Eosinophils Absolute: 0.3 10*3/uL (ref 0.0–0.5)
Eosinophils Relative: 3 %
HCT: 43.6 % (ref 36.0–46.0)
Hemoglobin: 13.2 g/dL (ref 12.0–15.0)
Immature Granulocytes: 0 %
Lymphocytes Relative: 18 %
Lymphs Abs: 1.4 10*3/uL (ref 0.7–4.0)
MCH: 26.9 pg (ref 26.0–34.0)
MCHC: 30.3 g/dL (ref 30.0–36.0)
MCV: 89 fL (ref 80.0–100.0)
Monocytes Absolute: 0.6 10*3/uL (ref 0.1–1.0)
Monocytes Relative: 8 %
Neutro Abs: 5.6 10*3/uL (ref 1.7–7.7)
Neutrophils Relative %: 71 %
Platelets: 253 10*3/uL (ref 150–400)
RBC: 4.9 MIL/uL (ref 3.87–5.11)
RDW: 14.1 % (ref 11.5–15.5)
WBC: 8 10*3/uL (ref 4.0–10.5)
nRBC: 0 % (ref 0.0–0.2)

## 2022-03-29 LAB — LACTIC ACID, PLASMA
Lactic Acid, Venous: 0.9 mmol/L (ref 0.5–1.9)
Lactic Acid, Venous: 1.2 mmol/L (ref 0.5–1.9)

## 2022-03-29 LAB — BASIC METABOLIC PANEL
Anion gap: 9 (ref 5–15)
BUN: 17 mg/dL (ref 8–23)
CO2: 31 mmol/L (ref 22–32)
Calcium: 9.2 mg/dL (ref 8.9–10.3)
Chloride: 98 mmol/L (ref 98–111)
Creatinine, Ser: 1.06 mg/dL — ABNORMAL HIGH (ref 0.44–1.00)
GFR, Estimated: 58 mL/min — ABNORMAL LOW (ref >60)
Glucose, Bld: 126 mg/dL — ABNORMAL HIGH (ref 70–99)
Potassium: 3.6 mmol/L (ref 3.5–5.1)
Sodium: 138 mmol/L (ref 135–145)

## 2022-03-29 LAB — C-REACTIVE PROTEIN: CRP: 13.3 mg/dL — ABNORMAL HIGH (ref <1.0)

## 2022-03-29 LAB — HEMOGLOBIN A1C
Hgb A1c MFr Bld: 10 % — ABNORMAL HIGH (ref 4.8–5.6)
Mean Plasma Glucose: 240.3 mg/dL

## 2022-03-29 LAB — CBG MONITORING, ED: Glucose-Capillary: 122 mg/dL — ABNORMAL HIGH (ref 70–99)

## 2022-03-29 LAB — SEDIMENTATION RATE: Sed Rate: 54 mm/hr — ABNORMAL HIGH (ref 0–22)

## 2022-03-29 MED ORDER — AMLODIPINE BESYLATE 10 MG PO TABS
10.0000 mg | ORAL_TABLET | Freq: Every day | ORAL | Status: DC
  Administered 2022-03-29 – 2022-04-01 (×4): 10 mg via ORAL
  Filled 2022-03-29: qty 1
  Filled 2022-03-29: qty 2
  Filled 2022-03-29: qty 1
  Filled 2022-03-29: qty 2

## 2022-03-29 MED ORDER — INSULIN GLARGINE-YFGN 100 UNIT/ML ~~LOC~~ SOLN
50.0000 [IU] | Freq: Two times a day (BID) | SUBCUTANEOUS | Status: DC
  Administered 2022-03-30 – 2022-03-31 (×3): 50 [IU] via SUBCUTANEOUS
  Filled 2022-03-29 (×6): qty 0.5

## 2022-03-29 MED ORDER — INSULIN ASPART 100 UNIT/ML IJ SOLN: 0.0000 [IU] | Freq: Every day | INTRAMUSCULAR | Status: DC

## 2022-03-29 MED ORDER — HYDRALAZINE HCL 50 MG PO TABS
100.0000 mg | ORAL_TABLET | Freq: Three times a day (TID) | ORAL | Status: DC
  Administered 2022-03-29 – 2022-03-31 (×4): 100 mg via ORAL
  Filled 2022-03-29 (×5): qty 2

## 2022-03-29 MED ORDER — INSULIN ASPART 100 UNIT/ML IJ SOLN
0.0000 [IU] | Freq: Three times a day (TID) | INTRAMUSCULAR | Status: DC
  Administered 2022-03-30: 4 [IU] via SUBCUTANEOUS
  Administered 2022-03-30 – 2022-03-31 (×3): 3 [IU] via SUBCUTANEOUS

## 2022-03-29 MED ORDER — LISINOPRIL 10 MG PO TABS
10.0000 mg | ORAL_TABLET | Freq: Every day | ORAL | Status: DC
  Administered 2022-03-29 – 2022-04-01 (×4): 10 mg via ORAL
  Filled 2022-03-29 (×4): qty 1

## 2022-03-29 MED ORDER — SODIUM CHLORIDE 0.9 % IV SOLN
2.0000 g | INTRAVENOUS | Status: DC
  Administered 2022-03-29 – 2022-03-31 (×3): 2 g via INTRAVENOUS
  Filled 2022-03-29 (×3): qty 20

## 2022-03-29 MED ORDER — ATORVASTATIN CALCIUM 80 MG PO TABS
80.0000 mg | ORAL_TABLET | Freq: Every day | ORAL | Status: DC
  Administered 2022-03-30 – 2022-03-31 (×2): 80 mg via ORAL
  Filled 2022-03-29 (×2): qty 1

## 2022-03-29 MED ORDER — ACETAMINOPHEN 325 MG PO TABS: 650.0000 mg | ORAL_TABLET | Freq: Four times a day (QID) | ORAL | Status: DC | PRN

## 2022-03-29 MED ORDER — CARVEDILOL 6.25 MG PO TABS
6.2500 mg | ORAL_TABLET | Freq: Two times a day (BID) | ORAL | Status: DC
  Administered 2022-03-30 – 2022-04-01 (×5): 6.25 mg via ORAL
  Filled 2022-03-29: qty 1
  Filled 2022-03-29: qty 2
  Filled 2022-03-29 (×3): qty 1

## 2022-03-29 MED ORDER — METRONIDAZOLE 500 MG PO TABS
500.0000 mg | ORAL_TABLET | Freq: Two times a day (BID) | ORAL | Status: DC
Start: 2022-03-29 — End: 2022-04-01
  Administered 2022-03-29 – 2022-03-31 (×5): 500 mg via ORAL
  Filled 2022-03-29 (×5): qty 1

## 2022-03-29 MED ORDER — ONDANSETRON HCL 4 MG/2ML IJ SOLN: 4.0000 mg | Freq: Four times a day (QID) | INTRAMUSCULAR | Status: DC | PRN

## 2022-03-29 MED ORDER — ONDANSETRON HCL 4 MG PO TABS: 4.0000 mg | ORAL_TABLET | Freq: Four times a day (QID) | ORAL | Status: DC | PRN

## 2022-03-29 MED ORDER — HEPARIN SODIUM (PORCINE) 5000 UNIT/ML IJ SOLN
5000.0000 [IU] | Freq: Three times a day (TID) | INTRAMUSCULAR | Status: DC
  Administered 2022-03-29 – 2022-04-01 (×9): 5000 [IU] via SUBCUTANEOUS
  Filled 2022-03-29 (×9): qty 1

## 2022-03-29 MED ORDER — VANCOMYCIN HCL 2000 MG/400ML IV SOLN
2000.0000 mg | Freq: Once | INTRAVENOUS | Status: AC
Start: 2022-03-29 — End: 2022-03-30
  Administered 2022-03-29: 2000 mg via INTRAVENOUS
  Filled 2022-03-29: qty 400

## 2022-03-29 MED ORDER — LABETALOL HCL 5 MG/ML IV SOLN
10.0000 mg | Freq: Once | INTRAVENOUS | Status: AC
  Administered 2022-03-29: 10 mg via INTRAVENOUS
  Filled 2022-03-29: qty 4

## 2022-03-29 MED ORDER — ACETAMINOPHEN 650 MG RE SUPP: 650.0000 mg | Freq: Four times a day (QID) | RECTAL | Status: DC | PRN

## 2022-03-29 MED ORDER — VANCOMYCIN HCL 1250 MG/250ML IV SOLN: 1250.0000 mg | INTRAVENOUS | Status: DC

## 2022-03-29 NOTE — Progress Notes (Signed)
? ?Subjective:  ?66 y.o. female with PMHx of diabetes mellitus, PVD, and prior left great toe amputation presenting for follow-up evaluation of an ulcer to the plantar aspect of the fifth toe left foot.  Patient has been coming in for routine dressing changes.  Over the past week the foot has become red with swelling and increased drainage from the wound.  She presents for today for further treatment and evaluation ? ?Past Medical History:  ?Diagnosis Date  ? Anemia   ? requiring transfusion  ? Boil of buttock ~ 2016  ? Depression   ? Diabetic neuropathy ([place])   ? Archie Endo 08/23/2017  ? DKA (diabetic ketoacidoses)   ? recent/notes 01/06/2015  ? Hyperlipidemia   ? Hypertension   ? Neuropathy   ? Peripheral vascular disease ([place])   ? Toe ulcer ([place])   ? left great toe/notes 08/23/2017  ? Type II diabetes mellitus ([place])   ? ?Past Surgical History:  ?Procedure Laterality Date  ? AMPUTATION TOE Left 10/26/2017  ? Procedure: AMPUTATION PARTIAL RAY LEFT FOOT AND IRRIGATION/DEBRIDEMENT;  Surgeon: Edrick Kins, DPM;  Location: [facility];  Service: Podiatry;  Laterality: Left;  AMPUTATION PARTIAL RAY LEFT FOOT AND IRRIGATION/DEBRIDEMENT  ? CHOLECYSTECTOMY  01/24/2003  ? Archie Endo 04/04/2011  ? COLONOSCOPY    ? INCISION AND DRAINAGE ABSCESS  03/17/2012  ? "right but; left lower abdoment"/notes 03/17/2012  ? LOWER EXTREMITY INTERVENTION N/A 09/10/2017  ? Procedure: LOWER EXTREMITY INTERVENTION;  Surgeon: Lorretta Harp, MD;  Location: [facility];  Service: Cardiovascular;  Laterality: N/A;  ? PERIPHERAL VASCULAR ATHERECTOMY  09/10/2017  ? Procedure: PERIPHERAL VASCULAR ATHERECTOMY;  Surgeon: Lorretta Harp, MD;  Location: [facility];  Service: Cardiovascular;;  left AT  ? ?Allergies  ?Allergen Reactions  ? Other Swelling  ?  Seaweed= swelling on arms, hands and face  ? Metformin And Related Nausea Only  ? ? ? ? ? ?  ?Objective/Physical Exam ?General: The patient is alert and oriented x3 in no acute  distress. ? ?Dermatology:  ?Wound #1 noted to the plantar aspect of the left great toe.  There is associated serous drainage coming from the area.  Increased erythema and edema also noted encompassing the majority of the foot.  Concerning for acute cellulitis ? ?Vascular: Known history of PVD.  Skin is warm to touch however.  Erythema and edema noted left foot ? ?VAS [place] LLE ARTERIAL DUPLEX 02/14/2022 ?Left: Moderate progression is noted compared to previous study.  ? ?Heterogenous plaque throughout.  ?50-74% stenosis in the proximal CFA.  ?30-49% stenosis in the proximal, mid and distal SFA.  ?30-49% stenosis in the above-the-knee and below-the-knee popliteal artery.  ?There is at least a one vessel run-off via ATA with elevated velocities  ?suggesting 50-99% restenosis in the mid/distal segment, s/p atherectomy.  ?Distal PTA appears occluded; peroneal artery not visualized.  ? ?Neurological: Light touch and protective threshold absent bilaterally.  ? ?Musculoskeletal Exam: History of prior left great toe amputation. ? ?Radiographic exam LT foot, today, 03/29/2022: ?Cortical destruction and breakdown noted to the base of the proximal phalanx as well as the head of the fifth metatarsal, fifth digit left.  Associated soft tissue edema noted.  No radiolucency or gas within the tissues.  Findings consistent with acute onset of osteomyelitis. ?Partial first ray amputation also noted with well-healed cortices.  ? ?Assessment: ?1.  Ulcer left foot secondary to diabetes mellitus ?2. diabetes mellitus w/ peripheral neuropathy ?3.  Cellulitis with osteomyelitis left  foot ? ? ?Plan of Care:  ?1. Patient was evaluated.  X-rays reviewed today.  Dressings applied today. ?2.  Advised the patient that she needs to go immediately to the emergency department for admission, IV antibiotics, MRI LT foot wo contrast, and vascular work-up and consultation.   ?3.  Please consult podiatry once admitted.  Recommend vascular consult as welL ?4.   Podiatry will follow while inpatient once admitted ? ? ?Edrick Kins, DPM ?[facility] ? ?Dr. Edrick Kins, DPM  ?  ?[facility]                                        ?[place], [place] [address]                ?Office [phone]  ?Fax [phone] ? ? ? ? ?

## 2022-03-29 NOTE — ED Provider Triage Note (Signed)
Emergency Medicine Provider Triage Evaluation Note ? ?Katherine [name] , a 66 y.o. female  was evaluated in triage.  Pt complains of left foot pain since Saturday.  Patient sent from podiatry office for evaluation of osteomyelitis.  Patient is diabetic.  History of left great toe amputation in the past. ? ?Review of Systems  ?Positive: As above ?Negative:  as above ? ?Physical Exam  ?BP (!) 167/64   Pulse 65   Temp 98.6 ?F (37 ?C) (Oral)   Resp 16   SpO2 98%  ?Gen:   Awake, no distress   ?Resp:  Normal effort  ?MSK:   Moves extremities without difficulty  ?Other:  Dopplerable DP pulse present on the left.  Wound noted at the base of the left fifth toe. ? ?Medical Decision Making  ?Medically screening exam initiated at 5:46 PM.  Appropriate orders placed.  Katherine [name] was informed that the remainder of the evaluation will be completed by another provider, this initial triage assessment does not replace that evaluation, and the importance of remaining in the ED until their evaluation is complete. ? ? ?  ?Evlyn Courier, PA-C ?03/29/22 1748 ? ?

## 2022-03-29 NOTE — Subjective & Objective (Signed)
Chief complaint: Left foot osteomyelitis ?History of present illness: ?66 year old African-American female history of type 2 diabetes, polyneuropathy, hypertension, gastroparesis, hyperlipidemia, left foot wound presents to the ER today from podiatry office.  Patient's had a wound on her left foot since at least March 12, 2022.  She has been followed by podiatry.  She states that she has been visiting her husband in the hospital.  She has been walking on her left foot.  She states that over the weekend, her foot has increased in size, erythema and pain.  Patient seen by podiatry today.  Clinic x-rays were taken.  Showed osteomyelitis of the left foot.  Patient sent to the ER for evaluation. ? ?X-ray of the left foot demonstrated lytic lesion of her left foot consistent with osteomyelitis. ? ?[facility] hospitalist contacted for admission. ?

## 2022-03-29 NOTE — ED Provider Notes (Signed)
?[facility] ?Provider Note ? ? ?CSN: [id number] ?Arrival date & time: 03/29/22  1629 ? ?  ? ?History ? ?Chief Complaint  ?Patient presents with  ? Foot Pain  ? ? ?Katherine [name] is a 66 y.o. female who presents today for evaluation of a sore on her left foot. ?She has been seen by podiatry multiple times for this after it developed about a month ago he does get frequent wound care visits through podiatry.  She reports that since the weekend when she was walking around the hospital to visit her husband she has had increasing pain redness and swelling.  She was seen by Dr. Amalia Hailey of podiatry earlier today who referred her to the emergency room. ? ?Per his note from today "Plan of Care:  ?1. Patient was evaluated.  X-rays reviewed today.  Dressings applied today. ?2.  Advised the patient that she needs to go immediately to the emergency department for admission, IV antibiotics, MRI LT foot wo contrast, and vascular work-up and consultation.   ?3.  Please consult podiatry once admitted.  Recommend vascular consult as welL ?4.  Podiatry will follow while inpatient once admitted" ? ?HPI ? ?  ? ?Home Medications ?Prior to Admission medications   ?Medication Sig Start Date End Date Taking? Authorizing Provider  ?amLODipine (NORVASC) 10 MG tablet Take 1 tablet (10 mg total) by mouth daily. 05/03/21   Ladell Pier, MD  ?atorvastatin (LIPITOR) 80 MG tablet Take 1 tablet (80 mg total) by mouth daily at 6 PM. 10/31/17   Lorretta Harp, MD  ?atorvastatin (LIPITOR) 80 MG tablet Take 1 tablet (80 mg total) by mouth daily. 05/03/21   Ladell Pier, MD  ?carvedilol (COREG) 6.25 MG tablet Take 1 tablet (6.25 mg total) by mouth 2 (two) times daily with a meal. 05/03/21   Ladell Pier, MD  ?ciclopirox (PENLAC) 8 % solution Apply topically at bedtime. Apply over nail and surrounding skin. Apply daily over previous coat. After seven (7) days, may remove with alcohol and continue cycle.  05/03/21   Ladell Pier, MD  ?ciprofloxacin (CIPRO) 500 MG tablet Take 1 tablet (500 mg total) by mouth 2 (two) times daily. One po bid x 7 days 03/01/22   Drenda Freeze, MD  ?clopidogrel (PLAVIX) 75 MG tablet TAKE 1 TABLET BY MOUTH DAILY WITH BREAKFAST. 05/19/21   Lorretta Harp, MD  ?Continuous Blood Gluc Receiver (FREESTYLE LIBRE 2 READER) DEVI Scan as needed for continuous glucose monitoring. 09/09/21     ?Continuous Blood Gluc Receiver (FREESTYLE LIBRE 2 READER) DEVI Use as directed 3 times daily 05/26/21   Ladell Pier, MD  ?Continuous Blood Gluc Sensor (FREESTYLE LIBRE 2 SENSOR) MISC Inject 1 sensor to the skin every 14 days for continuous glucose monitoring. 09/09/21     ?Continuous Blood Gluc Sensor (FREESTYLE LIBRE 2 SENSOR) MISC Change sensor every 2 weeks. 05/26/21   Ladell Pier, MD  ?gabapentin (NEURONTIN) 100 MG capsule To take in addition to your prescribed '600mg'$  twice daily 02/26/22   Gareth Morgan, MD  ?gabapentin (NEURONTIN) 600 MG tablet Take 1 tablet (600 mg total) by mouth 2 (two) times daily. 05/03/21   Ladell Pier, MD  ?glucose blood (ONETOUCH VERIO) test strip 1 each by Misc.(Non-Drug; Combo Route) route 4 times daily before meals and nightly. 09/09/21     ?hydrALAZINE (APRESOLINE) 100 MG tablet Take 1 tablet (100 mg total) by mouth 3 (three) times daily. 05/03/21  Ladell Pier, MD  ?hydrochlorothiazide (HYDRODIURIL) 25 MG tablet Take 1 tablet (25 mg total) by mouth daily. 05/03/21   Ladell Pier, MD  ?insulin aspart (NOVOLOG) 100 UNIT/ML FlexPen Inject 10-17 units into the skin 3 times daily before meals. 09/09/21     ?Insulin Aspart FlexPen (NOVOLOG) 100 UNIT/ML Inject 10-17 Units into the skin 3 times daily before meals. 07/22/21     ?insulin glargine, 1 Unit Dial, (TOUJEO) 300 UNIT/ML Solostar Pen Inject 185 Units into the skin daily. 05/03/21   Ladell Pier, MD  ?Lancets [facility] DELICA PLUS [id number]) MISC Use 4 times daily before meals and  nightly. 09/09/21     ?lisinopril (ZESTRIL) 10 MG tablet Take 1 tablet (10 mg total) by mouth daily. 09/09/21     ?lisinopril (ZESTRIL) 5 MG tablet Take 1 tablet (5 mg total) by mouth daily. 05/04/21   Ladell Pier, MD  ?meloxicam (MOBIC) 7.5 MG tablet Take 1 tablet (7.5 mg total) by mouth daily. 06/06/20   Edrick Kins, DPM  ?metFORMIN (GLUCOPHAGE) 1000 MG tablet Take 1 tablet (1,000 mg total) by mouth 2 (two) times daily with a meal. 05/03/21   Ladell Pier, MD  ?metFORMIN (GLUCOPHAGE) 1000 MG tablet Take 1 tablet (1,000 mg total) by mouth 2 times daily with meals. 09/09/21     ?metoCLOPramide (REGLAN) 5 MG tablet Take 1 tablet (5 mg total) by mouth 2 (two) times daily as needed for nausea. 05/03/21   Ladell Pier, MD  ?Multiple Vitamins-Minerals (MULTIVITAMIN ADULT) CHEW Chew 1 tablet by mouth daily.    [provider]  ?TOUJEO SOLOSTAR 300 UNIT/ML Solostar Pen Inject 80 Units into the skin nightly. 09/09/21     ?TRUEPLUS PEN NEEDLES 31G X 5 MM MISC See admin instructions. 06/11/18   [provider]  ?   ? ?Allergies    ?Other and Metformin and related   ? ?Review of Systems   ?Review of Systems ? ?Physical Exam ?Updated Vital Signs ?BP (!) 203/64   Pulse 72   Temp 98.6 ?F (37 ?C) (Oral)   Resp 19   SpO2 99%  ?Physical Exam ?Vitals and nursing note reviewed.  ?Constitutional:   ?   General: She is not in acute distress. ?   Appearance: She is not diaphoretic.  ?HENT:  ?   Head: Normocephalic and atraumatic.  ?Eyes:  ?   General: No scleral icterus.    ?   Right eye: No discharge.     ?   Left eye: No discharge.  ?   Conjunctiva/sclera: Conjunctivae normal.  ?Cardiovascular:  ?   Rate and Rhythm: Normal rate and regular rhythm.  ?   Pulses:     ?     Dorsalis pedis pulses are detected w/ Doppler on the left side.  ?     Posterior tibial pulses are 0 on the left side.  ?   Comments: Left lower extremity is edematous from the knee down compared to the right lower  extremity. ?Pulmonary:  ?   Effort: Pulmonary effort is normal. No respiratory distress.  ?   Breath sounds: No stridor.  ?Abdominal:  ?   General: There is no distension.  ?Musculoskeletal:     ?   General: No deformity.  ?   Cervical back: Normal range of motion.  ?   Comments: Left lower extremity: Lower leg compartments are soft and easily compressible.  There is tactilely increased warmth through the leg from about  one third of the way distal from the knee down with associated edema and erythema and tenderness. ?Left right toe is surgically absent  ?Feet:  ?   Comments: Please see clinical images for skin on left foot.  There is a ulcer on the left foot. ?Skin: ?   General: Skin is warm and dry.  ?Neurological:  ?   Mental Status: She is alert.  ?   Motor: No abnormal muscle tone.  ?Psychiatric:     ?   Behavior: Behavior normal.  ? ? ? ? ? ? ? ? ? ? ? ?ED Results / Procedures / Treatments   ?Labs ?(all labs ordered are listed, but only abnormal results are displayed) ?Labs Reviewed  ?BASIC METABOLIC PANEL - Abnormal; Notable for the following components:  ?    Result Value  ? Glucose, Bld 126 (*)   ? Creatinine, Ser 1.06 (*)   ? GFR, Estimated 58 (*)   ? All other components within normal limits  ?SEDIMENTATION RATE - Abnormal; Notable for the following components:  ? Sed Rate 54 (*)   ? All other components within normal limits  ?C-REACTIVE PROTEIN - Abnormal; Notable for the following components:  ? CRP 13.3 (*)   ? All other components within normal limits  ?HEMOGLOBIN A1C - Abnormal; Notable for the following components:  ? Hgb A1c MFr Bld 10.0 (*)   ? All other components within normal limits  ?CBG MONITORING, ED - Abnormal; Notable for the following components:  ? Glucose-Capillary 122 (*)   ? All other components within normal limits  ?CULTURE, BLOOD (ROUTINE X 2)  ?CULTURE, BLOOD (ROUTINE X 2)  ?CBC WITH DIFFERENTIAL/PLATELET  ?LACTIC ACID, PLASMA  ?LACTIC ACID, PLASMA  ?CBC WITH DIFFERENTIAL/PLATELET   ?COMPREHENSIVE METABOLIC PANEL  ?MAGNESIUM  ?HIV ANTIBODY (ROUTINE TESTING W REFLEX)  ? ? ?EKG ?None ? ?Radiology ?DG Foot Complete Left ? ?Result Date: 03/29/2022 ?CLINICAL DATA:  Left foot wound. EXAM: LEFT FOOT - COMPLET

## 2022-03-29 NOTE — Progress Notes (Addendum)
Pharmacy Antibiotic Note ? ?Katherine [name] is a 66 y.o. female admitted on 03/29/2022 with  diabetic foot infection .  Pharmacy has been consulted for Vancomycin dosing. ? ?Plan: ?Vancomycin 2000 mg IV x 1, followed by 1250 mg q24h (eAUC 451.9, SCr 1.06, goal AUC 400-550) ?Ceftriaxone 2 g IV q24h ?Flagyl 500 mg IV q12h  ?Follow-up clinical status, renal function ?Follow-up cultures, LOT, narrow as able ?Obtain Vancomycin levels as appropriate  ? ?Height: 6' (182.9 cm) ?Weight: 100.45 kg (221 lb) ?IBW/kg (Calculated): 73.1 kg  ? ?Temp (24hrs), Avg:98.7 ?F (37.1 ?C), Min:98.6 ?F (37 ?C), Max:98.7 ?F (37.1 ?C) ? ?Recent Labs  ?Lab 03/29/22 ?1758 03/29/22 ?2030  ?WBC 8.0  --   ?CREATININE 1.06*  --   ?LATICACIDVEN 0.9 1.2  ?  ?CrCl cannot be calculated (Unknown ideal weight.).   ? ?Allergies  ?Allergen Reactions  ? Other Swelling  ?  Seaweed= swelling on arms, hands and face  ? Metformin And Related Nausea Only  ? ? ?Antimicrobials this admission: ?Vancomycin 5/10 >>  ?Ceftriaxone 5/10 >>  ?Flagyl 5/10 >>  ? ?Microbiology results: ?5/10 BCx: pending ? ? ?Thank you for allowing pharmacy to be a part of this patient?s care. ? ?Vance Peper, PharmD ?PGY1 Pharmacy Resident ?03/29/2022 9:24 PM  ? ?Please check AMION for all [place] phone numbers ?After 10:00 PM, call [place] [facility] [phone] ? ? ?

## 2022-03-29 NOTE — H&P (Addendum)
?History and Physical  ? ? ?Oriya Kettering Kirshenbaum ZOX:[id number] DOB: 09-13-1956 DOA: 03/29/2022 ? ?DOS: the patient was seen and examined on 03/29/2022 ? ?PCP: Ladell Pier, MD  ? ?Patient coming from: Home ? ?I have personally briefly reviewed patient's old medical records in [place] ? ?Chief complaint: Left foot osteomyelitis ?History of present illness: ?66 year old African-American female history of type 2 diabetes, polyneuropathy, hypertension, gastroparesis, hyperlipidemia, left foot wound presents to the ER today from podiatry office.  Patient's had a wound on her left foot since at least March 12, 2022.  She has been followed by podiatry.  She states that she has been visiting her husband in the hospital.  She has been walking on her left foot.  She states that over the weekend, her foot has increased in size, erythema and pain.  Patient seen by podiatry today.  Clinic x-rays were taken.  Showed osteomyelitis of the left foot.  Patient sent to the ER for evaluation. ? ?X-ray of the left foot demonstrated lytic lesion of her left foot consistent with osteomyelitis. ? ?[facility] hospitalist contacted for admission.  ? ?ED Course: Foot x-rays showed lytic lesion consistent with osteomyelitis. ? ?Review of Systems:  ?Review of Systems  ?Constitutional:  Negative for fever.  ?HENT: Negative.    ?Eyes: Negative.   ?Respiratory: Negative.    ?Cardiovascular: Negative.   ?Gastrointestinal: Negative.   ?Genitourinary: Negative.   ?Musculoskeletal: Negative.   ?Skin:   ?     Increase in erythema, swelling and drainage from the left foot.  ?Neurological: Negative.   ?Psychiatric/Behavioral: Negative.    ?All other systems reviewed and are negative. ? ?Past Medical History:  ?Diagnosis Date  ? Anemia   ? requiring transfusion  ? Boil of buttock ~ 2016  ? Depression   ? Diabetic neuropathy ([place])   ? Archie Endo 08/23/2017  ? DKA (diabetic ketoacidoses)   ? recent/notes 01/06/2015  ? Hyperlipidemia   ? Hypertension   ?  Neuropathy   ? Peripheral vascular disease ([place])   ? Toe ulcer ([place])   ? left great toe/notes 08/23/2017  ? Type II diabetes mellitus ([place])   ? ? ?Past Surgical History:  ?Procedure Laterality Date  ? AMPUTATION TOE Left 10/26/2017  ? Procedure: AMPUTATION PARTIAL RAY LEFT FOOT AND IRRIGATION/DEBRIDEMENT;  Surgeon: Edrick Kins, DPM;  Location: [facility];  Service: Podiatry;  Laterality: Left;  AMPUTATION PARTIAL RAY LEFT FOOT AND IRRIGATION/DEBRIDEMENT  ? CHOLECYSTECTOMY  01/24/2003  ? Archie Endo 04/04/2011  ? COLONOSCOPY    ? INCISION AND DRAINAGE ABSCESS  03/17/2012  ? "right but; left lower abdoment"/notes 03/17/2012  ? LOWER EXTREMITY INTERVENTION N/A 09/10/2017  ? Procedure: LOWER EXTREMITY INTERVENTION;  Surgeon: Lorretta Harp, MD;  Location: [facility];  Service: Cardiovascular;  Laterality: N/A;  ? PERIPHERAL VASCULAR ATHERECTOMY  09/10/2017  ? Procedure: PERIPHERAL VASCULAR ATHERECTOMY;  Surgeon: Lorretta Harp, MD;  Location: [facility];  Service: Cardiovascular;;  left AT  ? ? ? reports that she has never smoked. She has never used smokeless tobacco. She reports that she does not drink alcohol and does not use drugs. ? ?Allergies  ?Allergen Reactions  ? Other Swelling  ?  Seaweed= swelling on arms, hands and face  ? Metformin And Related Nausea Only  ? ? ?Family History  ?Problem Relation Age of Onset  ? Diabetes Father   ? Hypertension Father   ? Kidney disease Mother   ? Hypertension Mother   ?  Cancer Maternal Grandmother   ? Hypertension Maternal Grandfather   ? Diabetes Paternal Aunt   ? Diabetes Paternal Uncle   ? Diabetes Paternal Grandfather   ? Colon cancer Neg Hx   ? Breast cancer Neg Hx   ? ? ?Prior to Admission medications   ?Medication Sig Start Date End Date Taking? Authorizing Provider  ?amLODipine (NORVASC) 10 MG tablet Take 1 tablet (10 mg total) by mouth daily. 05/03/21   Ladell Pier, MD  ?atorvastatin (LIPITOR) 80 MG tablet Take 1 tablet (80 mg total) by mouth daily  at 6 PM. 10/31/17   Lorretta Harp, MD  ?atorvastatin (LIPITOR) 80 MG tablet Take 1 tablet (80 mg total) by mouth daily. 05/03/21   Ladell Pier, MD  ?carvedilol (COREG) 6.25 MG tablet Take 1 tablet (6.25 mg total) by mouth 2 (two) times daily with a meal. 05/03/21   Ladell Pier, MD  ?ciclopirox (PENLAC) 8 % solution Apply topically at bedtime. Apply over nail and surrounding skin. Apply daily over previous coat. After seven (7) days, may remove with alcohol and continue cycle. 05/03/21   Ladell Pier, MD  ?ciprofloxacin (CIPRO) 500 MG tablet Take 1 tablet (500 mg total) by mouth 2 (two) times daily. One po bid x 7 days 03/01/22   Drenda Freeze, MD  ?clopidogrel (PLAVIX) 75 MG tablet TAKE 1 TABLET BY MOUTH DAILY WITH BREAKFAST. 05/19/21   Lorretta Harp, MD  ?Continuous Blood Gluc Receiver (FREESTYLE LIBRE 2 READER) DEVI Scan as needed for continuous glucose monitoring. 09/09/21     ?Continuous Blood Gluc Receiver (FREESTYLE LIBRE 2 READER) DEVI Use as directed 3 times daily 05/26/21   Ladell Pier, MD  ?Continuous Blood Gluc Sensor (FREESTYLE LIBRE 2 SENSOR) MISC Inject 1 sensor to the skin every 14 days for continuous glucose monitoring. 09/09/21     ?Continuous Blood Gluc Sensor (FREESTYLE LIBRE 2 SENSOR) MISC Change sensor every 2 weeks. 05/26/21   Ladell Pier, MD  ?gabapentin (NEURONTIN) 100 MG capsule To take in addition to your prescribed '600mg'$  twice daily 02/26/22   Gareth Morgan, MD  ?gabapentin (NEURONTIN) 600 MG tablet Take 1 tablet (600 mg total) by mouth 2 (two) times daily. 05/03/21   Ladell Pier, MD  ?glucose blood (ONETOUCH VERIO) test strip 1 each by Misc.(Non-Drug; Combo Route) route 4 times daily before meals and nightly. 09/09/21     ?hydrALAZINE (APRESOLINE) 100 MG tablet Take 1 tablet (100 mg total) by mouth 3 (three) times daily. 05/03/21   Ladell Pier, MD  ?hydrochlorothiazide (HYDRODIURIL) 25 MG tablet Take 1 tablet (25 mg total) by mouth daily.  05/03/21   Ladell Pier, MD  ?insulin aspart (NOVOLOG) 100 UNIT/ML FlexPen Inject 10-17 units into the skin 3 times daily before meals. 09/09/21     ?Insulin Aspart FlexPen (NOVOLOG) 100 UNIT/ML Inject 10-17 Units into the skin 3 times daily before meals. 07/22/21     ?insulin glargine, 1 Unit Dial, (TOUJEO) 300 UNIT/ML Solostar Pen Inject 185 Units into the skin daily. 05/03/21   Ladell Pier, MD  ?Lancets [facility] DELICA PLUS [id number]) MISC Use 4 times daily before meals and nightly. 09/09/21     ?lisinopril (ZESTRIL) 10 MG tablet Take 1 tablet (10 mg total) by mouth daily. 09/09/21     ?lisinopril (ZESTRIL) 5 MG tablet Take 1 tablet (5 mg total) by mouth daily. 05/04/21   Ladell Pier, MD  ?meloxicam (MOBIC) 7.5 MG tablet Take 1 tablet (  7.5 mg total) by mouth daily. 06/06/20   Edrick Kins, DPM  ?metFORMIN (GLUCOPHAGE) 1000 MG tablet Take 1 tablet (1,000 mg total) by mouth 2 (two) times daily with a meal. 05/03/21   Ladell Pier, MD  ?metFORMIN (GLUCOPHAGE) 1000 MG tablet Take 1 tablet (1,000 mg total) by mouth 2 times daily with meals. 09/09/21     ?metoCLOPramide (REGLAN) 5 MG tablet Take 1 tablet (5 mg total) by mouth 2 (two) times daily as needed for nausea. 05/03/21   Ladell Pier, MD  ?Multiple Vitamins-Minerals (MULTIVITAMIN ADULT) CHEW Chew 1 tablet by mouth daily.    [provider]  ?TOUJEO SOLOSTAR 300 UNIT/ML Solostar Pen Inject 80 Units into the skin nightly. 09/09/21     ?TRUEPLUS PEN NEEDLES 31G X 5 MM MISC See admin instructions. 06/11/18   [provider]  ? ? ?Physical Exam: ?Vitals:  ? 03/29/22 1704 03/29/22 2015  ?BP: (!) 167/64 (!) 203/64  ?Pulse: 65 72  ?Resp: 16 19  ?Temp: 98.6 ?F (37 ?C)   ?TempSrc: Oral   ?SpO2: 98% 99%  ? ? ?Physical Exam ?Vitals and nursing note reviewed.  ?Constitutional:   ?   General: She is not in acute distress. ?   Appearance: Normal appearance. She is normal weight. She is not ill-appearing, toxic-appearing or  diaphoretic.  ?HENT:  ?   Head: Normocephalic and atraumatic.  ?   Nose: Nose normal. No rhinorrhea.  ?Cardiovascular:  ?   Rate and Rhythm: Normal rate and regular rhythm.  ?Pulmonary:  ?   Effort: Pulmonary effort is

## 2022-03-29 NOTE — Assessment & Plan Note (Signed)
Stable

## 2022-03-29 NOTE — ED Triage Notes (Signed)
Pt with left foot wound she has had dressed q 3 days at foot center.  Pt reports walking more this past weekend while husband was in the hospital .  Was told today that wound was infected and needed eval.  ?

## 2022-03-29 NOTE — Assessment & Plan Note (Addendum)
-   Controlled on current regimen of Norvasc 10 mg daily, Coreg 6.25 milligrams twice daily, hydralazine 100 mg 3 times daily, lisinopril 10 mg daily.  ?-Patient insistent she takes hydralazine twice a day not 3 times a day and requested it to be changed. ?-Patient will be discharged back home on home regimen of Norvasc, Coreg, hydralazine, lisinopril. ?-Outpatient follow-up with PCP. ?

## 2022-03-29 NOTE — ED Notes (Signed)
Patient transported to MRI 

## 2022-03-29 NOTE — Assessment & Plan Note (Addendum)
-  Patient sent to ED by podiatry due to concerns for osteomyelitis of the left foot . ?-Plain films done with lytic destruction involving the distal portion of the fifth metatarsal and fifth proximal phalanx consistent with osteomyelitis . ?-MRI left foot done concerning for osteomyelitis within the proximal phalanx greater than metatarsal head of the fifth toe with marrow edema extending throughout the distal 50% of the fifth metatarsal, soft tissue ulceration lateral to the fifth toe, postsurgical changes of amputation of the first ray.   ?-ABIs done with resting right ankle-brachial index within normal range, no evidence of significant right lower extremity arterial disease.  Right toe brachial index was abnormal.  Left ABI showed only mildly left lower extremity arterial disease, arterial Doppler waveforms at the ankle suggest some component of more moderate arterial occlusive disease.  Left great toe was amputated. ?-Patient seen in consultation by podiatry who are recommending vascular evaluation, surgical amputation of fifth toe and distal metatarsal of the left foot which patient declined and wanted to continue weighing her options.   ?-Patient initially placed on IV Rocephin, IV vancomycin, oral Flagyl. ?-ID consulted for antibiotic recommendations and further evaluation and management. ?-Patient seen by ID and antibiotics narrowed to IV vancomycin and IV Unasyn. ?-Vascular surgery consulted and patient seen in consultation by Dr. Carlis Abbott who recommended lower extremity arteriogram prior to undergoing toe amputation in the setting of osteomyelitis of the left foot however patient wanted to wait her options prior to that and as such patient will be scheduled as an outpatient on Thursday in the Cath Lab for this procedure to be done.  Procedures to ensure optimization of blood flow in order to heal properly. ?-Patient also followed by podiatry. ?-Case discussed with ID who had recommended discharging patient on  Augmentin and doxycycline, patient written a prescription for 4 weeks with outpatient follow-up with ID, Dr.Manandhar 04/10/2022. ?-Patient will follow-up with podiatry in the outpatient setting as well as vascular surgery and ID. ?-Patient was discharged in stable condition. ?-Per podiatry. ?

## 2022-03-29 NOTE — Assessment & Plan Note (Addendum)
Uncontrolled.  ?-Hemoglobin A1c 10.0 (03/29/2022). ?-CBG 72 this morning. ?-Patient's oral hypoglycemic agents were held during the hospitalization and patient maintained on Semglee 40 units twice daily, SSI. ?-Patient seen by diabetic coordinator. ?-Outpatient follow-up with PCP. ?

## 2022-03-29 NOTE — Assessment & Plan Note (Addendum)
-  Patient maintained on home regimen atorvastatin.  ?

## 2022-03-30 DIAGNOSIS — M86672 Other chronic osteomyelitis, left ankle and foot: Secondary | ICD-10-CM

## 2022-03-30 LAB — CBC WITH DIFFERENTIAL/PLATELET
Abs Immature Granulocytes: 0.03 10*3/uL (ref 0.00–0.07)
Basophils Absolute: 0 10*3/uL (ref 0.0–0.1)
Basophils Relative: 0 %
Eosinophils Absolute: 0.1 10*3/uL (ref 0.0–0.5)
Eosinophils Relative: 2 %
HCT: 37.3 % (ref 36.0–46.0)
Hemoglobin: 11.8 g/dL — ABNORMAL LOW (ref 12.0–15.0)
Immature Granulocytes: 0 %
Lymphocytes Relative: 9 %
Lymphs Abs: 0.8 10*3/uL (ref 0.7–4.0)
MCH: 28 pg (ref 26.0–34.0)
MCHC: 31.6 g/dL (ref 30.0–36.0)
MCV: 88.4 fL (ref 80.0–100.0)
Monocytes Absolute: 0.5 10*3/uL (ref 0.1–1.0)
Monocytes Relative: 5 %
Neutro Abs: 7.3 10*3/uL (ref 1.7–7.7)
Neutrophils Relative %: 84 %
Platelets: 223 10*3/uL (ref 150–400)
RBC: 4.22 MIL/uL (ref 3.87–5.11)
RDW: 13.8 % (ref 11.5–15.5)
WBC: 8.8 10*3/uL (ref 4.0–10.5)
nRBC: 0 % (ref 0.0–0.2)

## 2022-03-30 LAB — GLUCOSE, CAPILLARY
Glucose-Capillary: 185 mg/dL — ABNORMAL HIGH (ref 70–99)
Glucose-Capillary: 212 mg/dL — ABNORMAL HIGH (ref 70–99)

## 2022-03-30 LAB — COMPREHENSIVE METABOLIC PANEL
ALT: 10 U/L (ref 0–44)
AST: 10 U/L — ABNORMAL LOW (ref 15–41)
Albumin: 2.6 g/dL — ABNORMAL LOW (ref 3.5–5.0)
Alkaline Phosphatase: 80 U/L (ref 38–126)
Anion gap: 8 (ref 5–15)
BUN: 14 mg/dL (ref 8–23)
CO2: 27 mmol/L (ref 22–32)
Calcium: 8.5 mg/dL — ABNORMAL LOW (ref 8.9–10.3)
Chloride: 100 mmol/L (ref 98–111)
Creatinine, Ser: 0.78 mg/dL (ref 0.44–1.00)
GFR, Estimated: >60 mL/min (ref >60)
Glucose, Bld: 180 mg/dL — ABNORMAL HIGH (ref 70–99)
Potassium: 3.3 mmol/L — ABNORMAL LOW (ref 3.5–5.1)
Sodium: 135 mmol/L (ref 135–145)
Total Bilirubin: 0.9 mg/dL (ref 0.3–1.2)
Total Protein: 6.4 g/dL — ABNORMAL LOW (ref 6.5–8.1)

## 2022-03-30 LAB — CBG MONITORING, ED
Glucose-Capillary: 126 mg/dL — ABNORMAL HIGH (ref 70–99)
Glucose-Capillary: 141 mg/dL — ABNORMAL HIGH (ref 70–99)

## 2022-03-30 LAB — MAGNESIUM: Magnesium: 1.9 mg/dL (ref 1.7–2.4)

## 2022-03-30 MED ORDER — OXYCODONE-ACETAMINOPHEN 5-325 MG PO TABS
1.0000 | ORAL_TABLET | Freq: Four times a day (QID) | ORAL | Status: DC | PRN
  Administered 2022-03-30 – 2022-03-31 (×2): 1 via ORAL
  Filled 2022-03-30 (×2): qty 1

## 2022-03-30 MED ORDER — VANCOMYCIN HCL IN DEXTROSE 1-5 GM/200ML-% IV SOLN
1000.0000 mg | Freq: Two times a day (BID) | INTRAVENOUS | Status: DC
  Administered 2022-03-30 – 2022-03-31 (×2): 1000 mg via INTRAVENOUS
  Filled 2022-03-30 (×4): qty 200

## 2022-03-30 MED ORDER — POTASSIUM CHLORIDE CRYS ER 20 MEQ PO TBCR
40.0000 meq | EXTENDED_RELEASE_TABLET | Freq: Once | ORAL | Status: AC
Start: 2022-03-30 — End: 2022-03-30
  Administered 2022-03-30: 40 meq via ORAL
  Filled 2022-03-30: qty 2

## 2022-03-30 NOTE — Progress Notes (Signed)
Pharmacy Antibiotic Note ? ?AIDE [name] is a 66 y.o. female admitted on 03/29/2022 with left foot  diabetic foot infection and osteomyelitis.  Pharmacy has been consulted for vancomycin dosing.  Patient is also on Rocephin and Flagyl. ? ?Renal function improving, afebrile, WBC WNL. ? ?Plan: ?Increase vanc to 1gm IV Q12H for AUC 487 using SCr 0.8 ?Rocephin 2gm IV Q24H and Flagyl '500mg'$  PO BID per MD ?Monitor renal fxn, clinical progress, vanc levels as indicated ? ?Height: 6' (182.9 cm) ?Weight: 100.45 kg (221 lb) ?IBW/kg (Calculated): 73.1 kg  ? ?Temp (24hrs), Avg:98.8 ?F (37.1 ?C), Min:98.6 ?F (37 ?C), Max:99 ?F (37.2 ?C) ? ?Recent Labs  ?Lab 03/29/22 ?1758 03/29/22 ?2030 03/30/22 ?0093  ?WBC 8.0  --  8.8  ?CREATININE 1.06*  --  0.78  ?LATICACIDVEN 0.9 1.2  --   ? ?  ?Estimated Creatinine Clearance: 93 mL/min (by C-G formula based on SCr of 0.78 mg/dL).   ? ?Allergies  ?Allergen Reactions  ? Other Swelling  ?  Seaweed= swelling on arms, hands and face  ? Metformin And Related Nausea Only  ? ? ?Vanc 5/10 >>  ?Rocephin 5/10 >>  ?Flagyl 5/10 >> ?  ?5/10 BCx - NGTD ? ?Cheyanna Strick D. Mina Marble, PharmD, BCPS, BCCCP ?03/30/2022, 10:58 AM ? ?

## 2022-03-30 NOTE — ED Notes (Signed)
Patient transport delayed  ?

## 2022-03-30 NOTE — Hospital Course (Signed)
Patient is a 65 year old African-American female, history of poorly controlled type 2 diabetes mellitus hemoglobin A1c 10.0, polyneuropathy, hypertension, gastroparesis, hyperlipidemia presenting to the ED with a left foot wound from the podiatrist office due to concerns for possible osteomyelitis.  Plain films done concerning for osteomyelitis of the left foot.  MRI done concerning for osteomyelitis.  Patient placed on IV antibiotics.  Podiatry consulted for further evaluation and management. ?

## 2022-03-30 NOTE — Progress Notes (Signed)
?PROGRESS NOTE ? ? ? ?Katherine [name]  SEG:[id number] DOB: [birth year] DOA: 03/29/2022 ?PCP: Ladell Pier, MD  ? ? ?Chief Complaint  ?Patient presents with  ? Foot Pain  ? ? ?Brief Narrative:  ?Patient is a 66 year old African-American female, history of poorly controlled type 2 diabetes mellitus hemoglobin A1c 10.0, polyneuropathy, hypertension, gastroparesis, hyperlipidemia presenting to the ED with a left foot wound from the podiatrist office due to concerns for possible osteomyelitis.  Plain films done concerning for osteomyelitis of the left foot.  MRI done concerning for osteomyelitis.  Patient placed on IV antibiotics.  Podiatry consulted for further evaluation and management.  ? ? ?Assessment & Plan: ? Principal Problem: ?  Acute osteomyelitis of left foot ([place]) ?Active Problems: ?  Essential hypertension ?  Gastroparesis ?  Dyslipidemia ?  Type 2 diabetes mellitus with diabetic peripheral angiopathy without gangrene, with long-term current use of insulin ([place]) ? ? ? ?Assessment and Plan: ?* Acute osteomyelitis of left foot ([place]) ?-Patient sent to ED by podiatry due to concerns for osteomyelitis of the left foot . ?-Plain films done with lytic destruction involving the distal portion of the fifth metatarsal and fifth proximal phalanx consistent with osteomyelitis . ?-MRI left foot done concerning for osteomyelitis within the proximal phalanx greater than metatarsal head of the fifth toe with marrow edema extending throughout the distal 50% of the fifth metatarsal, soft tissue ulceration lateral to the fifth toe, postsurgical changes of amputation of the first ray.   ?-Continue IV Rocephin, IV vancomycin and oral Flagyl.  ?-Consult with podiatry for further evaluation and management.  ?-Further work-up and management per podiatry.  ?-Podiatry may consult vascular surgery if they feel it is needed. ? ?Type 2 diabetes mellitus with diabetic peripheral angiopathy without gangrene, with long-term current use of  insulin ([place]) ?Uncontrolled.  ?-Hemoglobin A1c 10.0 (03/29/2022). ?-CBG 126 this morning. ?-Continue Semglee 50 units twice daily, SSI.   ?-Hold oral hypoglycemic agents. ?-Consult with diabetic coordinator.  ? ?Dyslipidemia ?- Continue atorvastatin.  ? ?Gastroparesis ?Stable. ? ?Essential hypertension ?- Controlled on current regimen of Norvasc 10 mg daily, Coreg 6.25 milligrams twice daily, hydralazine 100 mg 3 times daily, lisinopril 10 mg daily.  ? ? ? ? ?  ? ? ?DVT prophylaxis: Heparin ?Code Status: Full ?Family Communication: Updated patient.  No family at bedside. ?Disposition: TBD ? ?Status is: Inpatient ?Remains inpatient appropriate because: Need for IV antibiotics, severity of illness, ?  ?Consultants:  ?Podiatry. ? ?Procedures:  ?Plain films left foot 03/29/2022 ?MRI left foot 03/29/2022 ? ? ?Antimicrobials:  ?IV Rocephin 03/29/2022>>>> ?IV vancomycin 03/29/2022>>>> ?Oral Flagyl 03/29/2022>>>> 04/05/2022 ? ? ?Subjective: ?Sitting up on gurney getting ready to be transported to the floor.  No chest pain.  No shortness of breath.  No abdominal pain.  Still with some pain in the left foot. ? ?Objective: ?Vitals:  ? 03/30/22 0805 03/30/22 1144 03/30/22 1325 03/30/22 1721  ?BP: 138/72 (!) 145/63 (!) 106/54 (!) 121/53  ?Pulse: 70 69 65 66  ?Resp: '16 16 18 18  '$ ?Temp:   97.7 ?F (36.5 ?C) 98.5 ?F (36.9 ?C)  ?TempSrc:    Oral  ?SpO2: 95% 95% 97% 98%  ?Weight:      ? ? ?Intake/Output Summary (Last 24 hours) at 03/30/2022 1842 ?Last data filed at 03/30/2022 1830 ?Gross per 24 hour  ?Intake 785.43 ml  ?Output --  ?Net 785.43 ml  ? ?Filed Weights  ? 03/30/22 0800  ?Weight: 100.4 kg  ? ? ?Examination: ? ?General  exam: Appears calm and comfortable  ?Respiratory system: Clear to auscultation. Respiratory effort normal. ?Cardiovascular system: S1 & S2 heard, RRR. No JVD, murmurs, rubs, gallops or clicks. No pedal edema. ?Gastrointestinal system: Abdomen is nondistended, soft and nontender. No organomegaly or masses felt. Normal  bowel sounds heard. ?Central nervous system: Alert and oriented. No focal neurological deficits. ?Extremities: Left foot with tenderness along left lateral aspect on fifth metatarsal.  Some slight erythema.  Status post left first ray amputation. ?Skin: No rashes, lesions or ulcers ?Psychiatry: Judgement and insight appear normal. Mood & affect appropriate.  ? ? ? ?Data Reviewed:  ? ?CBC: ?Recent Labs  ?Lab 03/29/22 ?1758 03/30/22 ?1194  ?WBC 8.0 8.8  ?NEUTROABS 5.6 7.3  ?HGB 13.2 11.8*  ?HCT 43.6 37.3  ?MCV 89.0 88.4  ?PLT 253 223  ? ? ?Basic Metabolic Panel: ?Recent Labs  ?Lab 03/29/22 ?1758 03/30/22 ?1740  ?NA 138 135  ?K 3.6 3.3*  ?CL 98 100  ?CO2 31 27  ?GLUCOSE 126* 180*  ?BUN 17 14  ?CREATININE 1.06* 0.78  ?CALCIUM 9.2 8.5*  ?MG  --  1.9  ? ? ?GFR: ?Estimated Creatinine Clearance: 93 mL/min (by C-G formula based on SCr of 0.78 mg/dL). ? ?Liver Function Tests: ?Recent Labs  ?Lab 03/30/22 ?8144  ?AST 10*  ?ALT 10  ?ALKPHOS 80  ?BILITOT 0.9  ?PROT 6.4*  ?ALBUMIN 2.6*  ? ? ?CBG: ?Recent Labs  ?Lab 03/29/22 ?2218 03/30/22 ?8185 03/30/22 ?1243 03/30/22 ?1719  ?GLUCAP 122* 126* 141* 185*  ? ? ? ?Recent Results (from the past 240 hour(s))  ?Blood culture (routine x 2)     Status: None (Preliminary result)  ? Collection Time: 03/29/22  6:04 PM  ? Specimen: BLOOD  ?Result Value Ref Range Status  ? Specimen Description BLOOD RIGHT ANTECUBITAL  Final  ? Special Requests   Final  ?  BOTTLES DRAWN AEROBIC AND ANAEROBIC Blood Culture adequate volume  ? Culture   Final  ?  NO GROWTH < 24 HOURS ?Performed at [facility], [place] [address] [place], [place] [address] ?  ? Report Status PENDING  Incomplete  ?Blood culture (routine x 2)     Status: None (Preliminary result)  ? Collection Time: 03/29/22  8:30 PM  ? Specimen: BLOOD  ?Result Value Ref Range Status  ? Specimen Description BLOOD RIGHT ANTECUBITAL  Final  ? Special Requests   Final  ?  BOTTLES DRAWN AEROBIC AND ANAEROBIC Blood Culture results may not be optimal  due to an inadequate volume of blood received in culture bottles  ? Culture   Final  ?  NO GROWTH < 12 HOURS ?Performed at [facility], [place] [address], [place], [place] [address] ?  ? Report Status PENDING  Incomplete  ?  ? ? ? ? ? ?Radiology Studies: ?MR FOOT LEFT WO CONTRAST ? ?Result Date: 03/30/2022 ?CLINICAL DATA:  Foot osteomyelitis. EXAM: MRI OF THE LEFT FOOT WITHOUT CONTRAST TECHNIQUE: Multiplanar, multisequence MR imaging of the left forefoot was performed. No intravenous contrast was administered. COMPARISON:  left foot radiographs 03/29/2022 (multiple studies); left foot radiographs 02/26/2022 FINDINGS: Bones/Joint/Cartilage Postsurgical changes are again seen of amputation of the first ray to the level of the mid to distal metatarsal shaft. There is heterogeneous predominantly decreased T1 increased T2 signal within the soft tissues distal to the transsection site. There is mild-to-moderate marrow edema within the distal aspect of the postsurgical first metatarsal. No definitive erosion is seen, however evaluation is limited due  to postsurgical changes in this region. The marrow edema may be bland, however it is difficult to completely exclude the presence of infection. There is irregularity within the soft tissues lateral to the fifth toe phalanges. There is a decreased T1 and increased T2 signal sinus tract extending from the plantar lateral aspect of the fifth metatarsophalangeal joint in a plantar distal, and lateral direction towards the skin surface at the level of the distal most aspect of the proximal phalanx of the fifth toe (coronal series 9 images 9 through 13, axial series 10 images 18 through 22). There is high-grade marrow enhancement throughout the distal 50% of the fifth meta tarsal shaft and the fifth metatarsal. There is high-grade marrow edema throughout the phalanges of the fifth toe. There is cortical erosion of the lateral aspect of the fifth metatarsal head and the lateral  aspect of the proximal phalanx of fifth toe (axial series 5 images 19 through 26). There appears to be mild edema within the fifth toe distal phalanx without definite cortical erosion. Findings are [employer]

## 2022-03-30 NOTE — Consult Note (Signed)
Podiatry Consult Note ? ?To: Dr. Irine Seal, hospitalist ?Reason for consult: Osteomyelitis left foot ?From: Dr. Landis Martins, podiatry ? ?HPI: ?Katherine [name] is a 66 y.o. female patient who seen at bedside for evaluation of left foot infection.  Patient reports that this started to worsen when she went to visit her sick husband in the hospital over the weekend states that she did a lot of walking and felt like she had increased pain and swelling in the foot reports that she went to her office this week and had her dressing changed on yesterday by Dr. Amalia Hailey who advised her to go to the hospital because of increased redness swelling and infection on the foot.  Patient reports that she is very stressed and wants to get back home to her husband and does not want any bad news at this time.  Patient reports that she is already lost her big toe and does not want to lose her baby toe.  Patient denies any other pedal complaints or constitutional symptoms at this time. ? ?Patient Active Problem List  ? Diagnosis Date Noted  ? Acute osteomyelitis of left foot ([place]) 03/29/2022  ? Neuropathy 12/08/2021  ? Diabetic gastroparesis associated with type 2 diabetes mellitus ([place]) 07/30/2021  ? Type 2 diabetes mellitus with diabetic peripheral angiopathy without gangrene, with long-term current use of insulin ([place]) 07/30/2021  ? Amputation of left great toe ([place]) 06/13/2021  ? Microalbuminuria due to type 2 diabetes mellitus ([place]) 05/04/2021  ? Diabetes ([place]) 12/22/2019  ? Renal artery stenosis ([place]) 12/01/2019  ? Diabetic polyneuropathy associated with type 2 diabetes mellitus ([place]) 05/14/2018  ? Thyroid nodule 10/04/2017  ? Microcytic anemia 09/11/2017  ? Critical lower limb ischemia ([place]) 09/10/2017  ? Retinopathy 08/22/2017  ? Thyroid enlargement 07/12/2017  ? Dyslipidemia 09/20/2016  ? DEPRESSION 09/10/2009  ? LEG CRAMPS 05/19/2009  ? GERD 02/15/2009  ? Gastroparesis 09/03/2008  ? DENTAL CARIES 07/14/2008  ? Essential  hypertension 09/16/2003  ? ? ?No current facility-administered medications on file prior to encounter.  ? ?Current Outpatient Medications on File Prior to Encounter  ?Medication Sig Dispense Refill  ? amLODipine (NORVASC) 10 MG tablet Take 1 tablet (10 mg total) by mouth daily. 30 tablet 6  ? atorvastatin (LIPITOR) 80 MG tablet Take 1 tablet (80 mg total) by mouth daily at 6 PM. (Patient taking differently: Take 80 mg by mouth at bedtime.) 30 tablet 6  ? BAQSIMI ONE PACK 3 MG/DOSE POWD Place 1 spray into the nose as needed (Low blood sugar).    ? Carboxymethylcellulose Sodium (EYE DROPS OP) Place 1 drop into both eyes daily.    ? carvedilol (COREG) 6.25 MG tablet Take 1 tablet (6.25 mg total) by mouth 2 (two) times daily with a meal. (Patient taking differently: Take 6.25 mg by mouth daily.) 60 tablet 6  ? clopidogrel (PLAVIX) 75 MG tablet TAKE 1 TABLET BY MOUTH DAILY WITH BREAKFAST. (Patient taking differently: Take 75 mg by mouth daily.) 90 tablet 2  ? gabapentin (NEURONTIN) 600 MG tablet Take 1 tablet (600 mg total) by mouth 2 (two) times daily. 60 tablet 6  ? hydrALAZINE (APRESOLINE) 100 MG tablet Take 1 tablet (100 mg total) by mouth 3 (three) times daily. 90 tablet 6  ? hydrochlorothiazide (HYDRODIURIL) 25 MG tablet Take 1 tablet (25 mg total) by mouth daily. 30 tablet 6  ? insulin aspart (NOVOLOG) 100 UNIT/ML FlexPen Inject 10-17 units into the skin 3 times daily before meals. (Patient taking differently: Inject  10 Units into the skin 3 (three) times daily with meals.) 45 mL 3  ? lisinopril (ZESTRIL) 10 MG tablet Take 1 tablet (10 mg total) by mouth daily. 90 tablet 3  ? Multiple Vitamins-Minerals (MULTIVITAMIN ADULT) CHEW Chew 2 tablets by mouth daily.    ? TOUJEO SOLOSTAR 300 UNIT/ML Solostar Pen Inject 80 Units into the skin nightly. 30 mL 3  ? VITAMIN D PO Take 2 tablets by mouth at bedtime.    ? ciclopirox (PENLAC) 8 % solution Apply topically at bedtime. Apply over nail and surrounding skin. Apply daily  over previous coat. After seven (7) days, may remove with alcohol and continue cycle. (Patient not taking: Reported on 03/30/2022) 6.6 mL 0  ? ciprofloxacin (CIPRO) 500 MG tablet Take 1 tablet (500 mg total) by mouth 2 (two) times daily. One po bid x 7 days (Patient not taking: Reported on 03/30/2022) 14 tablet 0  ? Continuous Blood Gluc Receiver (FREESTYLE LIBRE 2 READER) DEVI Scan as needed for continuous glucose monitoring. 1 each 0  ? Continuous Blood Gluc Receiver (FREESTYLE LIBRE 2 READER) DEVI Use as directed 3 times daily 1 each 12  ? Continuous Blood Gluc Sensor (FREESTYLE LIBRE 2 SENSOR) MISC Inject 1 sensor to the skin every 14 days for continuous glucose monitoring. 2 each 11  ? Continuous Blood Gluc Sensor (FREESTYLE LIBRE 2 SENSOR) MISC Change sensor every 2 weeks. 2 each 12  ? gabapentin (NEURONTIN) 100 MG capsule To take in addition to your prescribed '600mg'$  twice daily (Patient not taking: Reported on 03/30/2022) 30 capsule 0  ? glucose blood (ONETOUCH VERIO) test strip 1 each by Misc.(Non-Drug; Combo Route) route 4 times daily before meals and nightly. 400 each 3  ? Lancets (ONETOUCH DELICA PLUS [id number]) MISC Use 4 times daily before meals and nightly. 400 each 3  ? lisinopril (ZESTRIL) 5 MG tablet Take 1 tablet (5 mg total) by mouth daily. (Patient not taking: Reported on 03/30/2022) 30 tablet 6  ? meloxicam (MOBIC) 7.5 MG tablet Take 1 tablet (7.5 mg total) by mouth daily. (Patient not taking: Reported on 03/30/2022) 30 tablet 0  ? metFORMIN (GLUCOPHAGE) 1000 MG tablet Take 1 tablet (1,000 mg total) by mouth 2 (two) times daily with a meal. (Patient not taking: Reported on 03/30/2022) 60 tablet 5  ? metoCLOPramide (REGLAN) 5 MG tablet Take 1 tablet (5 mg total) by mouth 2 (two) times daily as needed for nausea. 60 tablet 1  ? TRUEPLUS PEN NEEDLES 31G X 5 MM MISC See admin instructions.  11  ? ? ?Allergies  ?Allergen Reactions  ? Other Swelling  ?  Seaweed= swelling on arms, hands and face  ? Metformin  And Related Nausea Only  ? ? ?Past Surgical History:  ?Procedure Laterality Date  ? AMPUTATION TOE Left 10/26/2017  ? Procedure: AMPUTATION PARTIAL RAY LEFT FOOT AND IRRIGATION/DEBRIDEMENT;  Surgeon: Edrick Kins, DPM;  Location: [facility];  Service: Podiatry;  Laterality: Left;  AMPUTATION PARTIAL RAY LEFT FOOT AND IRRIGATION/DEBRIDEMENT  ? CHOLECYSTECTOMY  01/24/2003  ? Archie Endo 04/04/2011  ? COLONOSCOPY    ? INCISION AND DRAINAGE ABSCESS  03/17/2012  ? "right but; left lower abdoment"/notes 03/17/2012  ? LOWER EXTREMITY INTERVENTION N/A 09/10/2017  ? Procedure: LOWER EXTREMITY INTERVENTION;  Surgeon: Lorretta Harp, MD;  Location: [facility];  Service: Cardiovascular;  Laterality: N/A;  ? PERIPHERAL VASCULAR ATHERECTOMY  09/10/2017  ? Procedure: PERIPHERAL VASCULAR ATHERECTOMY;  Surgeon: Lorretta Harp, MD;  Location: [facility];  Service: Cardiovascular;;  left AT  ? ? ?Family History  ?Problem Relation Age of Onset  ? Diabetes Father   ? Hypertension Father   ? Kidney disease Mother   ? Hypertension Mother   ? Cancer Maternal Grandmother   ? Hypertension Maternal Grandfather   ? Diabetes Paternal Aunt   ? Diabetes Paternal Uncle   ? Diabetes Paternal Grandfather   ? Colon cancer Neg Hx   ? Breast cancer Neg Hx   ? ? ?Social History  ? ?Socioeconomic History  ? Marital status: Married  ?  Spouse name: Not on file  ? Number of children: Not on file  ? Years of education: Not on file  ? Highest education level: Not on file  ?Occupational History  ? Not on file  ?Tobacco Use  ? Smoking status: Never  ? Smokeless tobacco: Never  ?Vaping Use  ? Vaping Use: Never used  ?Substance and Sexual Activity  ? Alcohol use: No  ? Drug use: No  ? Sexual activity: Never  ?Other Topics Concern  ? Not on file  ?Social History Narrative  ? Not on file  ? ?Social Determinants of Health  ? ?Financial Resource Strain: Not on file  ?Food Insecurity: Not on file  ?Transportation Needs: Not on file  ?Physical Activity: Not  on file  ?Stress: Not on file  ?Social Connections: Not on file  ?Intimate Partner Violence: Not on file  ? ? ? ?Objective:  ?Today's Vitals  ? 03/30/22 1144 03/30/22 1325 03/30/22 1509 03/30/22 1721

## 2022-03-31 DIAGNOSIS — E1169 Type 2 diabetes mellitus with other specified complication: Secondary | ICD-10-CM

## 2022-03-31 LAB — BASIC METABOLIC PANEL
Anion gap: 13 (ref 5–15)
BUN: 27 mg/dL — ABNORMAL HIGH (ref 8–23)
CO2: 22 mmol/L (ref 22–32)
Calcium: 8.4 mg/dL — ABNORMAL LOW (ref 8.9–10.3)
Chloride: 98 mmol/L (ref 98–111)
Creatinine, Ser: 1.05 mg/dL — ABNORMAL HIGH (ref 0.44–1.00)
GFR, Estimated: 59 mL/min — ABNORMAL LOW (ref >60)
Glucose, Bld: 125 mg/dL — ABNORMAL HIGH (ref 70–99)
Potassium: 3.9 mmol/L (ref 3.5–5.1)
Sodium: 133 mmol/L — ABNORMAL LOW (ref 135–145)

## 2022-03-31 LAB — GLUCOSE, CAPILLARY
Glucose-Capillary: 72 mg/dL (ref 70–99)
Glucose-Capillary: 134 mg/dL — ABNORMAL HIGH (ref 70–99)
Glucose-Capillary: 138 mg/dL — ABNORMAL HIGH (ref 70–99)
Glucose-Capillary: 83 mg/dL (ref 70–99)

## 2022-03-31 LAB — CBC WITH DIFFERENTIAL/PLATELET
Abs Immature Granulocytes: 0.02 10*3/uL (ref 0.00–0.07)
Basophils Absolute: 0 10*3/uL (ref 0.0–0.1)
Basophils Relative: 0 %
Eosinophils Absolute: 0.3 10*3/uL (ref 0.0–0.5)
Eosinophils Relative: 4 %
HCT: 38.4 % (ref 36.0–46.0)
Hemoglobin: 11.9 g/dL — ABNORMAL LOW (ref 12.0–15.0)
Immature Granulocytes: 0 %
Lymphocytes Relative: 23 %
Lymphs Abs: 1.5 10*3/uL (ref 0.7–4.0)
MCH: 27.8 pg (ref 26.0–34.0)
MCHC: 31 g/dL (ref 30.0–36.0)
MCV: 89.7 fL (ref 80.0–100.0)
Monocytes Absolute: 0.6 10*3/uL (ref 0.1–1.0)
Monocytes Relative: 9 %
Neutro Abs: 4.3 10*3/uL (ref 1.7–7.7)
Neutrophils Relative %: 64 %
Platelets: 151 10*3/uL (ref 150–400)
RBC: 4.28 MIL/uL (ref 3.87–5.11)
RDW: 14 % (ref 11.5–15.5)
WBC: 6.7 10*3/uL (ref 4.0–10.5)
nRBC: 0 % (ref 0.0–0.2)

## 2022-03-31 LAB — MAGNESIUM: Magnesium: 2.1 mg/dL (ref 1.7–2.4)

## 2022-03-31 MED ORDER — INSULIN GLARGINE-YFGN 100 UNIT/ML ~~LOC~~ SOLN: 45.0000 [IU] | Freq: Two times a day (BID) | SUBCUTANEOUS | Status: DC

## 2022-03-31 MED ORDER — HYDRALAZINE HCL 50 MG PO TABS
100.0000 mg | ORAL_TABLET | Freq: Two times a day (BID) | ORAL | Status: DC
Start: 2022-03-31 — End: 2022-04-01
  Administered 2022-03-31 – 2022-04-01 (×2): 100 mg via ORAL
  Filled 2022-03-31 (×2): qty 2

## 2022-03-31 MED ORDER — INSULIN GLARGINE-YFGN 100 UNIT/ML ~~LOC~~ SOLN
40.0000 [IU] | Freq: Two times a day (BID) | SUBCUTANEOUS | Status: DC
  Administered 2022-03-31: 40 [IU] via SUBCUTANEOUS
  Filled 2022-03-31 (×3): qty 0.4

## 2022-03-31 MED ORDER — MUPIROCIN CALCIUM 2 % EX CREA
TOPICAL_CREAM | Freq: Every day | CUTANEOUS | Status: DC
Start: 2022-04-01 — End: 2022-04-01
  Filled 2022-03-31: qty 15

## 2022-03-31 MED ORDER — VANCOMYCIN HCL 1500 MG/300ML IV SOLN
1500.0000 mg | INTRAVENOUS | Status: DC
  Administered 2022-04-01: 1500 mg via INTRAVENOUS
  Filled 2022-03-31: qty 300

## 2022-03-31 NOTE — Consult Note (Signed)
?Hospital Consult ? ? ? ?Reason for Consult:  osteomyelitis left foot ?Referring Physician:  podiatry ?MRN #:  [id number] ? ?History of Present Illness: [name] with history of diabetes, hypertension hyperlipidemia that vascular surgery has been consulted for left foot wound by podiatry.  She is a patient of Dr. Gwenlyn Found.  She underwent revascularization 09/10/2017 revealing an occluded anterior tibial and posterior tibial artery and per his documentation Dr. Gwenlyn Found recanalized her anterior tibial artery for a foot wound.  Recent left leg duplex shows a high-grade stenosis in the mid anterior tibial artery on 02/14/22.   No flow in PT or peroneal. AT only runoff.  Podiatry has been consulted this admission.  She now has acute osteomyelitis of the left fifth toe.  She has undergone a previous left first toe amputation. ? ?Past Medical History:  ?Diagnosis Date  ? Anemia   ? requiring transfusion  ? Boil of buttock ~ 2016  ? Depression   ? Diabetic neuropathy ([place])   ? Archie Endo 08/23/2017  ? DKA (diabetic ketoacidoses)   ? recent/notes 01/06/2015  ? Hyperlipidemia   ? Hypertension   ? Neuropathy   ? Peripheral vascular disease ([place])   ? Toe ulcer ([place])   ? left great toe/notes 08/23/2017  ? Type II diabetes mellitus ([place])   ? ? ?Past Surgical History:  ?Procedure Laterality Date  ? AMPUTATION TOE Left 10/26/2017  ? Procedure: AMPUTATION PARTIAL RAY LEFT FOOT AND IRRIGATION/DEBRIDEMENT;  Surgeon: Edrick Kins, DPM;  Location: [facility];  Service: Podiatry;  Laterality: Left;  AMPUTATION PARTIAL RAY LEFT FOOT AND IRRIGATION/DEBRIDEMENT  ? CHOLECYSTECTOMY  01/24/2003  ? Archie Endo 04/04/2011  ? COLONOSCOPY    ? INCISION AND DRAINAGE ABSCESS  03/17/2012  ? "right but; left lower abdoment"/notes 03/17/2012  ? LOWER EXTREMITY INTERVENTION N/A 09/10/2017  ? Procedure: LOWER EXTREMITY INTERVENTION;  Surgeon: Lorretta Harp, MD;  Location: [facility];  Service: Cardiovascular;  Laterality: N/A;  ? PERIPHERAL VASCULAR  ATHERECTOMY  09/10/2017  ? Procedure: PERIPHERAL VASCULAR ATHERECTOMY;  Surgeon: Lorretta Harp, MD;  Location: [facility];  Service: Cardiovascular;;  left AT  ? ? ?Allergies  ?Allergen Reactions  ? Other Swelling  ?  Seaweed= swelling on arms, hands and face  ? Metformin And Related Nausea Only  ? ? ?Prior to Admission medications   ?Medication Sig Start Date End Date Taking? Authorizing Provider  ?amLODipine (NORVASC) 10 MG tablet Take 1 tablet (10 mg total) by mouth daily. 05/03/21  Yes Ladell Pier, MD  ?atorvastatin (LIPITOR) 80 MG tablet Take 1 tablet (80 mg total) by mouth daily at 6 PM. ?Patient taking differently: Take 80 mg by mouth at bedtime. 10/31/17  Yes Lorretta Harp, MD  ?Neale Burly ONE PACK 3 MG/DOSE POWD Place 1 spray into the nose as needed (Low blood sugar). 03/14/22  Yes [provider]  ?Carboxymethylcellulose Sodium (EYE DROPS OP) Place 1 drop into both eyes daily.   Yes [provider]  ?carvedilol (COREG) 6.25 MG tablet Take 1 tablet (6.25 mg total) by mouth 2 (two) times daily with a meal. ?Patient taking differently: Take 6.25 mg by mouth daily. 05/03/21  Yes Ladell Pier, MD  ?clopidogrel (PLAVIX) 75 MG tablet TAKE 1 TABLET BY MOUTH DAILY WITH BREAKFAST. ?Patient taking differently: Take 75 mg by mouth daily. 05/19/21  Yes Lorretta Harp, MD  ?gabapentin (NEURONTIN) 600 MG tablet Take 1 tablet (600 mg total) by mouth 2 (two) times  daily. 05/03/21  Yes Ladell Pier, MD  ?hydrALAZINE (APRESOLINE) 100 MG tablet Take 1 tablet (100 mg total) by mouth 3 (three) times daily. 05/03/21  Yes Ladell Pier, MD  ?hydrochlorothiazide (HYDRODIURIL) 25 MG tablet Take 1 tablet (25 mg total) by mouth daily. 05/03/21  Yes Ladell Pier, MD  ?insulin aspart (NOVOLOG) 100 UNIT/ML FlexPen Inject 10-17 units into the skin 3 times daily before meals. ?Patient taking differently: Inject 10 Units into the skin 3 (three) times daily with meals. 09/09/21  Yes    ?lisinopril (ZESTRIL) 10 MG tablet Take 1 tablet (10 mg total) by mouth daily. 09/09/21  Yes   ?Multiple Vitamins-Minerals (MULTIVITAMIN ADULT) CHEW Chew 2 tablets by mouth daily.   Yes [provider]  ?TOUJEO SOLOSTAR 300 UNIT/ML Solostar Pen Inject 80 Units into the skin nightly. 09/09/21  Yes   ?VITAMIN D PO Take 2 tablets by mouth at bedtime.   Yes [provider]  ?ciclopirox (PENLAC) 8 % solution Apply topically at bedtime. Apply over nail and surrounding skin. Apply daily over previous coat. After seven (7) days, may remove with alcohol and continue cycle. ?Patient not taking: Reported on 03/30/2022 05/03/21   Ladell Pier, MD  ?ciprofloxacin (CIPRO) 500 MG tablet Take 1 tablet (500 mg total) by mouth 2 (two) times daily. One po bid x 7 days ?Patient not taking: Reported on 03/30/2022 03/01/22   Drenda Freeze, MD  ?Continuous Blood Gluc Receiver (FREESTYLE LIBRE 2 READER) DEVI Scan as needed for continuous glucose monitoring. 09/09/21     ?Continuous Blood Gluc Receiver (FREESTYLE LIBRE 2 READER) DEVI Use as directed 3 times daily 05/26/21   Ladell Pier, MD  ?Continuous Blood Gluc Sensor (FREESTYLE LIBRE 2 SENSOR) MISC Inject 1 sensor to the skin every 14 days for continuous glucose monitoring. 09/09/21     ?Continuous Blood Gluc Sensor (FREESTYLE LIBRE 2 SENSOR) MISC Change sensor every 2 weeks. 05/26/21   Ladell Pier, MD  ?gabapentin (NEURONTIN) 100 MG capsule To take in addition to your prescribed '600mg'$  twice daily ?Patient not taking: Reported on 03/30/2022 02/26/22   Gareth Morgan, MD  ?glucose blood (ONETOUCH VERIO) test strip 1 each by Misc.(Non-Drug; Combo Route) route 4 times daily before meals and nightly. 09/09/21     ?Lancets (ONETOUCH DELICA PLUS [id number]) MISC Use 4 times daily before meals and nightly. 09/09/21     ?lisinopril (ZESTRIL) 5 MG tablet Take 1 tablet (5 mg total) by mouth daily. ?Patient not taking: Reported on 03/30/2022 05/04/21   Ladell Pier, MD  ?meloxicam (MOBIC) 7.5 MG tablet Take 1 tablet (7.5 mg total) by mouth daily. ?Patient not taking: Reported on 03/30/2022 06/06/20   Edrick Kins, DPM  ?metFORMIN (GLUCOPHAGE) 1000 MG tablet Take 1 tablet (1,000 mg total) by mouth 2 (two) times daily with a meal. ?Patient not taking: Reported on 03/30/2022 05/03/21   Ladell Pier, MD  ?metoCLOPramide (REGLAN) 5 MG tablet Take 1 tablet (5 mg total) by mouth 2 (two) times daily as needed for nausea. 05/03/21   Ladell Pier, MD  ?TRUEPLUS PEN NEEDLES 31G X 5 MM MISC See admin instructions. 06/11/18   [provider]  ? ? ?Social History  ? ?Socioeconomic History  ? Marital status: Married  ?  Spouse name: Not on file  ? Number of children: Not on file  ? Years of education: Not on file  ? Highest education level: Not on file  ?Occupational History  ?  Not on file  ?Tobacco Use  ? Smoking status: Never  ? Smokeless tobacco: Never  ?Vaping Use  ? Vaping Use: Never used  ?Substance and Sexual Activity  ? Alcohol use: No  ? Drug use: No  ? Sexual activity: Never  ?Other Topics Concern  ? Not on file  ?Social History Narrative  ? Not on file  ? ?Social Determinants of Health  ? ?Financial Resource Strain: Not on file  ?Food Insecurity: Not on file  ?Transportation Needs: Not on file  ?Physical Activity: Not on file  ?Stress: Not on file  ?Social Connections: Not on file  ?Intimate Partner Violence: Not on file  ? ? ? ?Family History  ?Problem Relation Age of Onset  ? Diabetes Father   ? Hypertension Father   ? Kidney disease Mother   ? Hypertension Mother   ? Cancer Maternal Grandmother   ? Hypertension Maternal Grandfather   ? Diabetes Paternal Aunt   ? Diabetes Paternal Uncle   ? Diabetes Paternal Grandfather   ? Colon cancer Neg Hx   ? Breast cancer Neg Hx   ? ? ?ROS: '[x]'$  Positive   '[ ]'$  Negative   '[ ]'$  All sytems reviewed and are negative ? ?Cardiovascular: ?'[]'$  chest pain/pressure ?'[]'$  palpitations ?'[]'$  SOB lying flat ?'[]'$  DOE ?'[]'$  pain in legs  while walking ?'[]'$  pain in legs at rest ?'[]'$  pain in legs at night ?'[]'$  non-healing ulcers ?'[]'$  hx of DVT ?'[]'$  swelling in legs ? ?Pulmonary: ?'[]'$  productive cough ?'[]'$  asthma/wheezing ?'[]'$  home O2 ? ?Neurologic:

## 2022-03-31 NOTE — Care Management Important Message (Signed)
Important Message ? ?Patient Details  ?Name: Katherine [name] ?MRN: [id number] ?Date of Birth: [birth year] ? ? ?Medicare Important Message Given:  Yes ? ? ? ? ?Penelope Fittro ?03/31/2022, 3:33 PM ?

## 2022-03-31 NOTE — Progress Notes (Signed)
Subjective: ?Katherine [name] is a 66 y.o. female patient seen at bedside, resting comfortably in no acute distress for follow up evaluation left foot infection.  Patient reports that she is very hesitant to see me today and states that she does not want an amputation very nervous about this and is really concerned about being able to walk after an amputation surgery and is concerned about her husband who is home and sick.  Patient denies any other pedal complaints at this time. ? ?Patient Active Problem List  ? Diagnosis Date Noted  ? Acute osteomyelitis of left foot ([place]) 03/29/2022  ? Neuropathy 12/08/2021  ? Diabetic gastroparesis associated with type 2 diabetes mellitus ([place]) 07/30/2021  ? Type 2 diabetes mellitus with diabetic peripheral angiopathy without gangrene, with long-term current use of insulin ([place]) 07/30/2021  ? Amputation of left great toe ([place]) 06/13/2021  ? Microalbuminuria due to type 2 diabetes mellitus ([place]) 05/04/2021  ? Diabetes ([place]) 12/22/2019  ? Renal artery stenosis ([place]) 12/01/2019  ? Diabetic polyneuropathy associated with type 2 diabetes mellitus ([place]) 05/14/2018  ? Thyroid nodule 10/04/2017  ? Microcytic anemia 09/11/2017  ? Critical lower limb ischemia ([place]) 09/10/2017  ? Retinopathy 08/22/2017  ? Thyroid enlargement 07/12/2017  ? Dyslipidemia 09/20/2016  ? DEPRESSION 09/10/2009  ? LEG CRAMPS 05/19/2009  ? GERD 02/15/2009  ? Gastroparesis 09/03/2008  ? DENTAL CARIES 07/14/2008  ? Essential hypertension 09/16/2003  ? ? ? ?Current Facility-Administered Medications:  ?  acetaminophen (TYLENOL) tablet 650 mg, 650 mg, Oral, Q6H PRN **OR** acetaminophen (TYLENOL) suppository 650 mg, 650 mg, Rectal, Q6H PRN, Kristopher Oppenheim, DO ?  amLODipine (NORVASC) tablet 10 mg, 10 mg, Oral, Daily, Kristopher Oppenheim, DO, 10 mg at 03/31/22 0940 ?  atorvastatin (LIPITOR) tablet 80 mg, 80 mg, Oral, q1800, Kristopher Oppenheim, DO, 80 mg at 03/30/22 1727 ?  carvedilol (COREG) tablet 6.25 mg, 6.25 mg, Oral, BID WC, Kristopher Oppenheim, DO,  6.25 mg at 03/31/22 0849 ?  cefTRIAXone (ROCEPHIN) 2 g in sodium chloride 0.9 % 100 mL IVPB, 2 g, Intravenous, Q24H, Kristopher Oppenheim, DO, Last Rate: 200 mL/hr at 03/30/22 2122, 2 g at 03/30/22 2122 ?  heparin injection 5,000 Units, 5,000 Units, Subcutaneous, Q8H, Kristopher Oppenheim, DO, 5,000 Units at 03/31/22 1412 ?  hydrALAZINE (APRESOLINE) tablet 100 mg, 100 mg, Oral, BID, Eugenie Filler, MD ?  insulin aspart (novoLOG) injection 0-20 Units, 0-20 Units, Subcutaneous, TID WC, Kristopher Oppenheim, DO, 3 Units at 03/31/22 1411 ?  insulin aspart (novoLOG) injection 0-5 Units, 0-5 Units, Subcutaneous, QHS, Kristopher Oppenheim, DO ?  insulin glargine-yfgn [facility]) injection 40 Units, 40 Units, Subcutaneous, BID, Eugenie Filler, MD ?  lisinopril (ZESTRIL) tablet 10 mg, 10 mg, Oral, Daily, Kristopher Oppenheim, DO, 10 mg at 03/31/22 0940 ?  metroNIDAZOLE (FLAGYL) tablet 500 mg, 500 mg, Oral, Q12H, Kristopher Oppenheim, DO, 500 mg at 03/31/22 0940 ?  ondansetron (ZOFRAN) tablet 4 mg, 4 mg, Oral, Q6H PRN **OR** ondansetron (ZOFRAN) injection 4 mg, 4 mg, Intravenous, Q6H PRN, Kristopher Oppenheim, DO ?  oxyCODONE-acetaminophen (PERCOCET/ROXICET) 5-325 MG per tablet 1 tablet, 1 tablet, Oral, Q6H PRN, Kristopher Oppenheim, DO, 1 tablet at 03/30/22 1509 ?  [START ON 04/01/2022] vancomycin (VANCOREADY) IVPB 1500 mg/300 mL, 1,500 mg, Intravenous, Q24H, [place], Donalynn Furlong, [place] ? ?Allergies  ?Allergen Reactions  ? Other Swelling  ?  Seaweed= swelling on arms, hands and face  ? Metformin And Related Nausea Only  ? ? ? ?Objective: ?Today's Vitals  ? 03/31/22 1601 03/31/22 0444 03/31/22 0445  03/31/22 0858  ?BP: (!) 123/53 (!) 127/49  (!) 144/55  ?Pulse: (!) 59 60  61  ?Resp: '17 18  18  '$ ?Temp: 98.6 ?F (37 ?C) 97.9 ?F (36.6 ?C)  97.8 ?F (36.6 ?C)  ?TempSrc: Oral Oral  Oral  ?SpO2: 99% 97%  96%  ?Weight:   100.3 kg   ?PainSc:      ? ? ?General: No acute distress ? ?Left Lower extremity:Stockinet clean dry and intact.  Upon removal there is still significant soft tissue swelling to the dorsolateral  left foot with cellulitis to the level of the midfoot and localized warmth, there is a small wound plantar fifth metatarsal head unchanged from prior yesterday's exam.  DP and PT pedal pulses difficult to palpate due to edema, monophasic on Doppler as previously noted, mild tenderness to palpation to left foot.  Status post left hallux amputation performed in 2018. ? ? ?Assessment and Plan:  ?Problem List Items Addressed This Visit   ?None ?Visit Diagnoses   ? ? Cellulitis of left lower extremity    -  Primary  ? Other acute osteomyelitis of left foot ([place])      ? Relevant Medications  ? cefTRIAXone (ROCEPHIN) 2 g in sodium chloride 0.9 % 100 mL IVPB  ? metroNIDAZOLE (FLAGYL) tablet 500 mg  ? vancomycin (VANCOREADY) IVPB 2000 mg/400 mL (Completed)  ? vancomycin (VANCOREADY) IVPB 1500 mg/300 mL (Start on 04/01/2022  5:00 AM)  ? ?  ? ? ?-Patient seen and evaluated at bedside ?-Discussed with patient treatment options for osteomyelitis and cellulitis of left foot ?-Patient is still refusing surgery at this time and is noted to be teary-eyed and crying during this encounter but states that she wants to make the best decision for her foot because she does not want to lose the whole thing ?-I advised patient that I will revisit her again later this evening to see if I can help her with this decision meanwhile due to her history of PAD I did reach out to her previous vascular surgeon Dr. Gwenlyn Found who is out on vacation and advised me to consult vascular surgeon for possible need for intervention ?-Continue with antibiotics as directed by infectious disease ?-Continue with antibiotic cream and dry dressing left foot ?-Continue with limited weightbearing to tolerance with surgical shoe ?-Podiatry to follow ?-A second visit was made to patient. She reports that she still want to think about things because she is still concerned about everything and now possibly loosing more if she does not make the right decision; I reassured  patient that I will give her time to think and advised her regardless what she decides further vascular work up is still highly recommended.  ? ?Dr. Landis Martins ?On Call provider  ?[facility] ?[phone] office ?[phone] cell ?Available via secure chat   ?

## 2022-03-31 NOTE — Consult Note (Incomplete Revision)
? ?I have seen and examined the patient. I have personally reviewed the clinical findings, laboratory findings, microbiological data and imaging studies. The assessment and treatment plan was discussed with the Nurse Practitioner Mauricio Po. I agree with her/his recommendations except following additions/corrections. ? ?24 Y O female with h/o Type 2 DM complicated with polyneuropathy, gastroparesis, HLD, HTN, Depression,  PVD, Left DFU s/p left partial partial ray amputation who presented to the ED 5/10 for worsening left 5th toe ulcer with increasing redness/swelling and drainage. Seen in the ED 4/9 for left foot wound, received IV abtx and discharged on PO bactrim with a fu with podiatry given unremarkable work up. 4/9 wound cx growing strep gallolyticus, kleb pneumoniae, MSSA. She has been closely following Podiatry since that visit with last office visit on 5/10 where left foot had worsening swelling, redness and drainage.  ?  ?At ED, afebrile, no leukocytosis ?Labs remarkable for Cr 1.06, CRP 13.3, ESR 54  ?Blood cx 5/10 no growth in 2 days  ?  ?Imaging/microbiological data reviewed ? ?Patient tearful during our conversation today when told antibiotics are not going to cure her infection without source control with surgical intervention. She did not speak much after this but it appeared she wants to think about different surgical options. I have conveyed this to dr Cannon Kettle who will check on patient later in the day. Vascular surgery has been consulted  as well as ABI has been ordered ( pending) ? ?Continue Vancomycin, pharmacy to dose, ceftriaxone and PO metronidazole for now pending Podiatry reeval/Vascular surgery and ABI and whether she gets any surgical intervention ?Final recs on antibiotics to be made pending above ?Dr Juleen China available this weekend as needed, Otherwise I am back Monday. If she were to leave the hospital before completion of work up, PO doxycycline + augmentin for 4-6 weeks is  reasonable understanding it would not be curative.  ? ?Rosiland Oz, MD ?Infectious Disease Physician ?[facility] ?[address] [address] ?[place], [place] [address] ?Phone: [phone]  Fax: [phone] ? ? ?[facility]   ? ?Date of Admission:  03/29/2022    ? ?Total days of antibiotics 3 ?        ?      ?Reason for Consult: Osteomyelitis  ?Referring Provider: Dr. Irine Seal ?Primary Care Provider: Ladell Pier, MD ? ? ?ASSESSMENT: ? ?[name] is a 66 y/o female with poorly controlled Type 2 diabetes (last A1c 10.0) admitted with worsening diabetic left foot ulcer and found to have osteomyelitis with recommendation for amputation of the left fifth toe and distal metatarsal. Discussed that without surgical intervention this wound is unlikely to heal complicated by her poorly controlled diabetes and peripheral vascular disease. Awaiting Vascular Surgery evaluation. Previous wound cultures with MSSA, Klebsiella, and Streptococcus gallolyticus. Narrow antibiotics down to ceftriaxone and metronidazole for now.  Dr. Cannon Kettle to discuss surgical options. Seen by Diabetes Education to aid in improving glucose control. Remaining medical and supportive care per primary team.  ? ?PLAN: ? ?Continue current dose of Vancomycin, ceftriaxone and metronidazole. ?Wound care and any surgical intervention per Podiatry ?Awaiting Vascular Surgery consult ?Remaining medical and supportive care per primary team.  ? ? ?Principal Problem: ?  Acute osteomyelitis of left foot ([place]) ?Active Problems: ?  Essential hypertension ?  Gastroparesis ?  Dyslipidemia ?  Type 2 diabetes mellitus with diabetic peripheral angiopathy without gangrene, with long-term current use of insulin ([place]) ? ? ?  amLODipine  10 mg Oral Daily  ? atorvastatin  80 mg Oral q1800  ? carvedilol  6.25 mg Oral BID WC  ? heparin  5,000 Units Subcutaneous Q8H  ? hydrALAZINE  100 mg Oral TID   ? insulin aspart  0-20 Units Subcutaneous TID WC  ? insulin aspart  0-5 Units Subcutaneous QHS  ? insulin glargine-yfgn  50 Units Subcutaneous BID  ? lisinopril  10 mg Oral Daily  ? metroNIDAZOLE  500 mg Oral Q12H  ? ? ? ?HPI: [name] is a 66 y.o. female with previous medical history as detailed below and significant for Type 2 diabates (recent Z0S 92.3) complicated by diabetic neuropathy and diabetic foot ulcer, and peripheral vascular disease admitted from Podiatry office with worsening left foot diabetic ulcer with cellulitis and osteomyelitis of the left foot.  ? ?[name] was initially noted a wound in the shower and was seen in the ED on 02/26/22 with approximately 1 cm hole like lesion to the plantar surface of left foot directly below the fifth metatarsal following noting purulence able to be expressed. X-ray showed no evidence of fracture or acute abnormality. Discharged with Bactrim and follow up with Podiatry. On 02/27/22 followed with Dr. Sherryle Lis with wound with cellulitis and abscess with muscle involvement. On 03/01/22 blood cultures drawn in the ED positive for micrococcus luteus and abscess cultures 4/9 being polymicrobial with Streptococcus galloyticus, Klebsiella pnuemoniae and MSSA. She had been continued on Bactrim with addition of ciprofloxacin for 7 days. Followed up with Podiatry on 03/16/22 with no further antibiotics and continued wound care/dressing change. Seen for follow up with Dr. Amalia Hailey on 03/1022 with worsening redness, swelling, and drainage from the wound. X-ray showed acute osteomyelitis and was referred to ED for further evaluation and treatment.  ? ?Ms. Voorhees has been afebrile since admission with no leukocytosis. Foot x-ray with consistent with osteomyelitis with lytic destruction involving the distal portion of the fifth metatarsal and fifth proximal phalanx. MRI left foot with post-surgical changes of amputation to the first ray with mild marrow edema within the distal  aspect of the postsurgical bone with infection that could not be excluded; and soft tissue ulceration lateral to the fifth toe phalanges and sinus tract that extends from the plantar lateral skin surface of the fifth toe towards the fifth metatarsophalangeal joint; findings of osteomyelitis within the proximal phalanx greater than metatarsal head of the fifth toe with marrow edema extending through the distal 50% of the fifth metatarsal. Podiatry recommended amputation of the fifth toe and distal metatarsal. Initially declining surgery and wanted to consider IV antibiotics with PICC line. Vascular Surgery consult called. ID has been asked for antibiotic recommendations. Ms. Grillot currently lives at home with her husband and grandchild with whom she cares for.  ? ? ?Review of Systems: ?Review of Systems  ?Constitutional:  Negative for chills, fever and weight loss.  ?Respiratory:  Negative for cough, shortness of breath and wheezing.   ?Cardiovascular:  Negative for chest pain and leg swelling.  ?Gastrointestinal:  Negative for abdominal pain, constipation, diarrhea, nausea and vomiting.  ?Musculoskeletal:   ?     Wound/foot ulcer left foot.   ?Skin:  Negative for rash.  ? ? ?Past Medical History:  ?Diagnosis Date  ? Anemia   ? requiring transfusion  ? Boil of buttock ~ 2016  ? Depression   ? Diabetic neuropathy ([place])   ? Archie Endo 08/23/2017  ? DKA (diabetic ketoacidoses)   ? recent/notes 01/06/2015  ?  Hyperlipidemia   ? Hypertension   ? Neuropathy   ? Peripheral vascular disease ([place])   ? Toe ulcer ([place])   ? left great toe/notes 08/23/2017  ? Type II diabetes mellitus ([place])   ? ?Past Surgical History:  ?Procedure Laterality Date  ? AMPUTATION TOE Left 10/26/2017  ? Procedure: AMPUTATION PARTIAL RAY LEFT FOOT AND IRRIGATION/DEBRIDEMENT;  Surgeon: Edrick Kins, DPM;  Location: [facility];  Service: Podiatry;  Laterality: Left;  AMPUTATION PARTIAL RAY LEFT FOOT AND IRRIGATION/DEBRIDEMENT  ? CHOLECYSTECTOMY  01/24/2003  ?  Archie Endo 04/04/2011  ? COLONOSCOPY    ? INCISION AND DRAINAGE ABSCESS  03/17/2012  ? "right but; left lower abdoment"/notes 03/17/2012  ? LOWER EXTREMITY INTERVENTION N/A 09/10/2017  ? Procedure: LOWER EXTREMITY INTERVENT

## 2022-03-31 NOTE — Progress Notes (Signed)
?PROGRESS NOTE ? ? ? ?Katherine [name]  VZD:[id number] DOB: October 19, 1956 DOA: 03/29/2022 ?PCP: Ladell Pier, MD  ? ? ?Chief Complaint  ?Patient presents with  ? Foot Pain  ? ? ?Brief Narrative:  ?Patient is a 66 year old African-American female, history of poorly controlled type 2 diabetes mellitus hemoglobin A1c 10.0, polyneuropathy, hypertension, gastroparesis, hyperlipidemia presenting to the ED with a left foot wound from the podiatrist office due to concerns for possible osteomyelitis.  Plain films done concerning for osteomyelitis of the left foot.  MRI done concerning for osteomyelitis.  Patient placed on IV antibiotics.  Podiatry consulted for further evaluation and management.  ? ? ?Assessment & Plan: ? Principal Problem: ?  Acute osteomyelitis of left foot ([place]) ?Active Problems: ?  Essential hypertension ?  Gastroparesis ?  Dyslipidemia ?  Type 2 diabetes mellitus with diabetic peripheral angiopathy without gangrene, with long-term current use of insulin ([place]) ? ? ? ?Assessment and Plan: ?* Acute osteomyelitis of left foot ([place]) ?-Patient sent to ED by podiatry due to concerns for osteomyelitis of the left foot . ?-Plain films done with lytic destruction involving the distal portion of the fifth metatarsal and fifth proximal phalanx consistent with osteomyelitis . ?-MRI left foot done concerning for osteomyelitis within the proximal phalanx greater than metatarsal head of the fifth toe with marrow edema extending throughout the distal 50% of the fifth metatarsal, soft tissue ulceration lateral to the fifth toe, postsurgical changes of amputation of the first ray.   ?-Patient seen in consultation by podiatry who are recommending vascular evaluation, surgical amputation of fifth toe and distal metatarsal of the left foot which patient is currently declining. ?-Continue IV Rocephin, IV vancomycin and oral Flagyl.  ?-ID consulted for antibiotic recommendations and further evaluation and management. ?-Vascular  surgery consulted. ?-Per podiatry. ? ?Type 2 diabetes mellitus with diabetic peripheral angiopathy without gangrene, with long-term current use of insulin ([place]) ?Uncontrolled.  ?-Hemoglobin A1c 10.0 (03/29/2022). ?-CBG 72 this morning. ?-Continue to hold oral hypoglycemic agents ?-Patient states is on 80 units of Toujeo at home. ?-Decrease Semglee to 40 units twice daily.  SSI. ?-Diabetes coordinator following.. ? ?Dyslipidemia ?- Continue atorvastatin.  ? ?Gastroparesis ?Stable. ? ?Essential hypertension ?- Controlled on current regimen of Norvasc 10 mg daily, Coreg 6.25 milligrams twice daily, hydralazine 100 mg 3 times daily, lisinopril 10 mg daily.  ?-Patient insistent she takes hydralazine twice a day not 3 times a day and requesting it to be changed. ?-Change hydralazine to 100 mg twice daily. ?-Follow. ? ? ? ? ?  ? ? ?DVT prophylaxis: Heparin ?Code Status: Full ?Family Communication: Updated patient.  No family at bedside. ?Disposition: TBD ? ?Status is: Inpatient ?Remains inpatient appropriate because: Need for IV antibiotics, severity of illness, ?  ?Consultants:  ?Podiatry.  Dr. Cannon Kettle 03/30/2022 ?Infectious disease: 03/31/2022 ?Vascular surgery pending ? ?Procedures:  ?Plain films left foot 03/29/2022 ?MRI left foot 03/29/2022 ? ? ?Antimicrobials:  ?IV Rocephin 03/29/2022>>>> ?IV vancomycin 03/29/2022>>>> ?Oral Flagyl 03/29/2022>>>> 04/05/2022 ? ? ?Subjective: ?Patient sitting up in bed.  Still with complaints of left pain.  No chest pain.  No shortness of breath.  No abdominal pain.  Still very resistant to surgical amputation at this time.   ? ?Objective: ?Vitals:  ? 03/31/22 0444 03/31/22 0445 03/31/22 0858 03/31/22 1650  ?BP: (!) 127/49  (!) 144/55 (!) 153/55  ?Pulse: 60  61 66  ?Resp: '18  18 18  '$ ?Temp: 97.9 ?F (36.6 ?C)  97.8 ?F (36.6 ?C) 99 ?F (37.2 ?  C)  ?TempSrc: Oral  Oral Oral  ?SpO2: 97%  96% 97%  ?Weight:  100.3 kg    ? ? ?Intake/Output Summary (Last 24 hours) at 03/31/2022 1843 ?Last data filed at  03/31/2022 1300 ?Gross per 24 hour  ?Intake 1120 ml  ?Output 250 ml  ?Net 870 ml  ? ?Filed Weights  ? 03/30/22 0800 03/31/22 0445  ?Weight: 100.4 kg 100.3 kg  ? ? ?Examination: ? ?General exam: NAD. ?Respiratory system: CTA B.  No wheezes, no crackles, no rhonchi.  Normal respiratory effort.  ?Cardiovascular system: Regular rate rhythm no murmurs rubs or gallops.  No JVD.  No lower extremity edema.  ?Gastrointestinal system: Abdomen is soft, nontender, nondistended, positive bowel sounds.  No rebound.  No guarding. ?Central nervous system: Alert and oriented. No focal neurological deficits. ?Extremities: Left foot with tenderness along left lateral aspect on fifth metatarsal.  Some slight erythema.  Status post left first ray amputation. ?Skin: No rashes, lesions or ulcers ?Psychiatry: Judgement and insight appear normal. Mood & affect appropriate.  ? ? ? ?Data Reviewed:  ? ?CBC: ?Recent Labs  ?Lab 03/29/22 ?1758 03/30/22 ?9892 03/31/22 ?0225  ?WBC 8.0 8.8 6.7  ?NEUTROABS 5.6 7.3 4.3  ?HGB 13.2 11.8* 11.9*  ?HCT 43.6 37.3 38.4  ?MCV 89.0 88.4 89.7  ?PLT 253 223 151  ? ? ?Basic Metabolic Panel: ?Recent Labs  ?Lab 03/29/22 ?1758 03/30/22 ?1194 03/31/22 ?0225  ?NA 138 135 133*  ?K 3.6 3.3* 3.9  ?CL 98 100 98  ?CO2 '31 27 22  '$ ?GLUCOSE 126* 180* 125*  ?BUN 17 14 27*  ?CREATININE 1.06* 0.78 1.05*  ?CALCIUM 9.2 8.5* 8.4*  ?MG  --  1.9 2.1  ? ? ?GFR: ?Estimated Creatinine Clearance: 70.8 mL/min (A) (by C-G formula based on SCr of 1.05 mg/dL (H)). ? ?Liver Function Tests: ?Recent Labs  ?Lab 03/30/22 ?1740  ?AST 10*  ?ALT 10  ?ALKPHOS 80  ?BILITOT 0.9  ?PROT 6.4*  ?ALBUMIN 2.6*  ? ? ?CBG: ?Recent Labs  ?Lab 03/30/22 ?1719 03/30/22 ?2115 03/31/22 ?0854 03/31/22 ?1320 03/31/22 ?1646  ?GLUCAP 185* 212* 72 134* 138*  ? ? ? ?Recent Results (from the past 240 hour(s))  ?Blood culture (routine x 2)     Status: None (Preliminary result)  ? Collection Time: 03/29/22  6:04 PM  ? Specimen: BLOOD  ?Result Value Ref Range Status  ? Specimen  Description BLOOD RIGHT ANTECUBITAL  Final  ? Special Requests   Final  ?  BOTTLES DRAWN AEROBIC AND ANAEROBIC Blood Culture adequate volume  ? Culture   Final  ?  NO GROWTH 2 DAYS ?Performed at [facility], [place] [address], [place], [place] [address] ?  ? Report Status PENDING  Incomplete  ?Blood culture (routine x 2)     Status: None (Preliminary result)  ? Collection Time: 03/29/22  8:30 PM  ? Specimen: BLOOD  ?Result Value Ref Range Status  ? Specimen Description BLOOD RIGHT ANTECUBITAL  Final  ? Special Requests   Final  ?  BOTTLES DRAWN AEROBIC AND ANAEROBIC Blood Culture results may not be optimal due to an inadequate volume of blood received in culture bottles  ? Culture   Final  ?  NO GROWTH 2 DAYS ?Performed at [facility], [place] [address], [place], [place] [address] ?  ? Report Status PENDING  Incomplete  ?  ? ? ? ? ? ?Radiology Studies: ?MR FOOT LEFT WO CONTRAST ? ?Result Date: 03/30/2022 ?CLINICAL DATA:  Foot  osteomyelitis. EXAM: MRI OF THE LEFT FOOT WITHOUT CONTRAST TECHNIQUE: Multiplanar, multisequence MR imaging of the left forefoot was performed. No intravenous contrast was administered. COMPARISON:  left foot radiographs 03/29/2022 (multiple studies); left foot radiographs 02/26/2022 FINDINGS: Bones/Joint/Cartilage Postsurgical changes are again seen of amputation of the first ray to the level of the mid to distal metatarsal shaft. There is heterogeneous predominantly decreased T1 increased T2 signal within the soft tissues distal to the transsection site. There is mild-to-moderate marrow edema within the distal aspect of the postsurgical first metatarsal. No definitive erosion is seen, however evaluation is limited due to postsurgical changes in this region. The marrow edema may be bland, however it is difficult to completely exclude the presence of infection. There is irregularity within the soft tissues lateral to the fifth toe phalanges. There is a decreased T1 and increased T2  signal sinus tract extending from the plantar lateral aspect of the fifth metatarsophalangeal joint in a plantar distal, and lateral direction towards the skin surface at the level of the distal most aspect of

## 2022-03-31 NOTE — Progress Notes (Signed)
Pharmacy Antibiotic Note ? ?Katherine [name] is a 66 y.o. female admitted on 03/29/2022 with left foot  diabetic foot infection and osteomyelitis.  Pharmacy has been consulted for vancomycin dosing.  Patient is also on Rocephin and Flagyl. ? ?SCr bumped to 1.05 again, UOP not charted. She is afebrile, WBC WNL. Declining amputation currently. ? ?Plan: ?Decrease vancomycin to 1.5gm IV q24h for AUC 538.3 using SCr 1.05 ?Rocephin 2gm IV q24h and Flagyl '500mg'$  PO BID per MD ?Monitor renal fxn, clinical progress, vanc levels as indicated ? ?Height: 6' (182.9 cm) ?Weight: 100.45 kg (221 lb) ?IBW/kg (Calculated): 73.1 kg  ? ?Temp (24hrs), Avg:98.2 ?F (36.8 ?C), Min:97.8 ?F (36.6 ?C), Max:98.6 ?F (37 ?C) ? ?Recent Labs  ?Lab 03/29/22 ?1758 03/29/22 ?2030 03/30/22 ?5797 03/31/22 ?0225  ?WBC 8.0  --  8.8 6.7  ?CREATININE 1.06*  --  0.78 1.05*  ?LATICACIDVEN 0.9 1.2  --   --   ? ?  ?Estimated Creatinine Clearance: 70.8 mL/min (A) (by C-G formula based on SCr of 1.05 mg/dL (H)).   ? ?Allergies  ?Allergen Reactions  ? Other Swelling  ?  Seaweed= swelling on arms, hands and face  ? Metformin And Related Nausea Only  ? ? ?Vanc 5/10 >>  ?Rocephin 5/10 >>  ?Flagyl 5/10 >> ?  ?5/10 BCx - NGTD ? ?Thank you for involving pharmacy in this patient's care. ? ?Renold Genta, PharmD, BCPS ?Clinical Pharmacist ?Clinical phone for 03/31/2022 until 3p is [phone] ?03/31/2022 2:39 PM ? ?**Pharmacist phone directory can be found on [email or web address] listed under [place]** ? ? ?

## 2022-03-31 NOTE — Consult Note (Addendum)
? ?I have seen and examined the patient. I have personally reviewed the clinical findings, laboratory findings, microbiological data and imaging studies. The assessment and treatment plan was discussed with the Nurse Practitioner Mauricio Po. I agree with her/his recommendations except following additions/corrections. ? ?6 Y O female with h/o Type 2 DM complicated with polyneuropathy, gastroparesis, HLD, HTN, Depression,  PVD, Left DFU s/p left partial 1st ray amputation who presented to the ED 5/10 for worsening left 5th toe ulcer with increasing redness/swelling and drainage. Seen in the ED 4/9 for left foot wound, received IV abtx and discharged on PO bactrim with a fu with podiatry given unremarkable work up. Also added Ciprofloxacin on 4/12. 4/9 wound cx growing strep gallolyticus, kleb pneumoniae, MSSA. She has been closely following Podiatry since that visit with last office visit on 5/10 where left foot had worsening swelling, redness and drainage and recommended to come to ED ?  ?At ED, afebrile, no leukocytosis ?Labs remarkable for Cr 1.06, CRP 13.3, ESR 54  ?Blood cx 5/10 no growth in 2 days  ?  ?Imaging/microbiological data reviewed ? ?Patient tearful during our conversation today when told antibiotics are not going to cure her infection without source control with surgical intervention. She did not speak much after this but it appeared she wants to think about different surgical options. I have conveyed this to dr Cannon Kettle who will check on patient later in the day. Vascular surgery has been consulted  as well as ABI has been ordered ( pending) ? ?Will switch abtx to Vancomycin( pharmacy to dose) and Unasyn for now pending Podiatry reeval/Vascular surgery and ABI and whether she gets any surgical intervention ?Final recs on antibiotics to be made pending above ?Needs BG control  ? ?Dr Juleen China available this weekend as needed, Otherwise I am back Monday. ? ?Rosiland Oz, MD ?Infectious Disease  Physician ?[facility] ?[place] [address] ?[place], [place] [address] ?Phone: [phone]  Fax: [phone] ? ? ?[facility]   ? ?Date of Admission:  03/29/2022    ? ?Total days of antibiotics 3 ?        ?      ?Reason for Consult: Osteomyelitis  ?Referring Provider: Dr. Irine Seal ?Primary Care Provider: Ladell Pier, MD ? ? ?ASSESSMENT: ? ?Ms. Pilger is a 66 y/o female with poorly controlled Type 2 diabetes (last A1c 10.0) admitted with worsening diabetic left foot ulcer and found to have osteomyelitis with recommendation for amputation of the left fifth toe and distal metatarsal. Discussed that without surgical intervention this wound is unlikely to heal complicated by her poorly controlled diabetes and peripheral vascular disease. Awaiting Vascular Surgery evaluation. Previous wound cultures with MSSA, Klebsiella, and Streptococcus gallolyticus. Narrow antibiotics down to ceftriaxone and metronidazole for now.  Dr. Cannon Kettle to discuss surgical options. Seen by Diabetes Education to aid in improving glucose control. Remaining medical and supportive care per primary team.  ? ?PLAN: ? ?Continue current dose of Vancomycin, ceftriaxone and metronidazole. ?Wound care and any surgical intervention per Podiatry ?Awaiting Vascular Surgery consult ?Remaining medical and supportive care per primary team.  ? ? ?Principal Problem: ?  Acute osteomyelitis of left foot ([place]) ?Active Problems: ?  Essential hypertension ?  Gastroparesis ?  Dyslipidemia ?  Type 2 diabetes mellitus with diabetic peripheral angiopathy without gangrene, with long-term current use of insulin ([place]) ? ? ? amLODipine  10 mg Oral Daily  ? atorvastatin  80 mg  Oral q1800  ? carvedilol  6.25 mg Oral BID WC  ? heparin  5,000 Units Subcutaneous Q8H  ? hydrALAZINE  100 mg Oral TID  ? insulin aspart  0-20 Units Subcutaneous TID WC  ? insulin aspart  0-5 Units Subcutaneous QHS   ? insulin glargine-yfgn  50 Units Subcutaneous BID  ? lisinopril  10 mg Oral Daily  ? metroNIDAZOLE  500 mg Oral Q12H  ? ? ? ?HPI: DANASIA BAKER is a 66 y.o. female with previous medical history as detailed below and significant for Type 2 diabates (recent O7H 21.9) complicated by diabetic neuropathy and diabetic foot ulcer, and peripheral vascular disease admitted from Podiatry office with worsening left foot diabetic ulcer with cellulitis and osteomyelitis of the left foot.  ? ?Ms. Cabell was initially noted a wound in the shower and was seen in the ED on 02/26/22 with approximately 1 cm hole like lesion to the plantar surface of left foot directly below the fifth metatarsal following noting purulence able to be expressed. X-ray showed no evidence of fracture or acute abnormality. Discharged with Bactrim and follow up with Podiatry. On 02/27/22 followed with Dr. Sherryle Lis with wound with cellulitis and abscess with muscle involvement. On 03/01/22 blood cultures drawn in the ED positive for micrococcus luteus and abscess cultures 4/9 being polymicrobial with Streptococcus galloyticus, Klebsiella pnuemoniae and MSSA. She had been continued on Bactrim with addition of ciprofloxacin for 7 days. Followed up with Podiatry on 03/16/22 with no further antibiotics and continued wound care/dressing change. Seen for follow up with Dr. Amalia Hailey on 03/1022 with worsening redness, swelling, and drainage from the wound. X-ray showed acute osteomyelitis and was referred to ED for further evaluation and treatment.  ? ?Ms. Royer has been afebrile since admission with no leukocytosis. Foot x-ray with consistent with osteomyelitis with lytic destruction involving the distal portion of the fifth metatarsal and fifth proximal phalanx. MRI left foot with post-surgical changes of amputation to the first ray with mild marrow edema within the distal aspect of the postsurgical bone with infection that could not be excluded; and soft tissue  ulceration lateral to the fifth toe phalanges and sinus tract that extends from the plantar lateral skin surface of the fifth toe towards the fifth metatarsophalangeal joint; findings of osteomyelitis within the proximal phalanx greater than metatarsal head of the fifth toe with marrow edema extending through the distal 50% of the fifth metatarsal. Podiatry recommended amputation of the fifth toe and distal metatarsal. Initially declining surgery and wanted to consider IV antibiotics with PICC line. Vascular Surgery consult called. ID has been asked for antibiotic recommendations. Ms. Ebrahim currently lives at home with her husband and grandchild with whom she cares for.  ? ? ?Review of Systems: ?Review of Systems  ?Constitutional:  Negative for chills, fever and weight loss.  ?Respiratory:  Negative for cough, shortness of breath and wheezing.   ?Cardiovascular:  Negative for chest pain and leg swelling.  ?Gastrointestinal:  Negative for abdominal pain, constipation, diarrhea, nausea and vomiting.  ?Musculoskeletal:   ?     Wound/foot ulcer left foot.   ?Skin:  Negative for rash.  ? ? ?Past Medical History:  ?Diagnosis Date  ? Anemia   ? requiring transfusion  ? Boil of buttock ~ 2016  ? Depression   ? Diabetic neuropathy ([place])   ? Archie Endo 08/23/2017  ? DKA (diabetic ketoacidoses)   ? recent/notes 01/06/2015  ? Hyperlipidemia   ? Hypertension   ? Neuropathy   ?  Peripheral vascular disease ([place])   ? Toe ulcer ([place])   ? left great toe/notes 08/23/2017  ? Type II diabetes mellitus ([place])   ? ?Past Surgical History:  ?Procedure Laterality Date  ? AMPUTATION TOE Left 10/26/2017  ? Procedure: AMPUTATION PARTIAL RAY LEFT FOOT AND IRRIGATION/DEBRIDEMENT;  Surgeon: Edrick Kins, DPM;  Location: [facility];  Service: Podiatry;  Laterality: Left;  AMPUTATION PARTIAL RAY LEFT FOOT AND IRRIGATION/DEBRIDEMENT  ? CHOLECYSTECTOMY  01/24/2003  ? Archie Endo 04/04/2011  ? COLONOSCOPY    ? INCISION AND DRAINAGE ABSCESS  03/17/2012  ? "right but;  left lower abdoment"/notes 03/17/2012  ? LOWER EXTREMITY INTERVENTION N/A 09/10/2017  ? Procedure: LOWER EXTREMITY INTERVENTION;  Surgeon: Lorretta Harp, MD;  Location: [facility];  Service: Cardiovascula

## 2022-03-31 NOTE — Progress Notes (Addendum)
Inpatient Diabetes Program Recommendations ? ?AACE/ADA: New Consensus Statement on Inpatient Glycemic Control (2015) ? ?Target Ranges:  Prepandial:   less than 140 mg/dL ?     Peak postprandial:   less than 180 mg/dL (1-2 hours) ?     Critically ill patients:  140 - 180 mg/dL  ? ? Latest Reference Range & Units 03/30/22 08:29 03/30/22 12:43 03/30/22 17:19 03/30/22 21:15  ?Glucose-Capillary 70 - 99 mg/dL 126 (H) ? ?3 units Novolog ? ?50 units Semglee _0  ? 141 (H) 185 (H) ? ?4 units Novolog ? 212 (H) ? ? ? ?50 units Semglee _1   ? ? Latest Reference Range & Units 03/31/22 08:54  ?Glucose-Capillary 70 - 99 mg/dL 72  ? ? Latest Reference Range & Units 03/29/22 17:58  ?Hemoglobin A1C 4.8 - 5.6 % 10.0 (H) ? ?(240 mg/dl)  ?(H): Data is abnormally high ? ? ?Admit with: Acute osteomyelitis of left foot ? ?History: DM2 ? ?Home DM Meds: Novolog 10-17 units TID ?      Toujeo 185 units daily (see ENDO notes below) ?      Metformin 1000 mg BID ?      Dexcom CGM ? ?Current Orders: Semglee 50 units BID ?     Novolog Resistant Correction Scale/ SSI (0-20 units) TID AC + HS ? ? ?MD- Note CBG 72 this AM ? ?Please consider reducing the Semglee to 45 units BID ? ? ? ?Endocrinologist: Dr. Grandville Silos with [facility] ?Last Seen 03/14/2022 ?Instructions at that visit: ?Reduce Toujeo to 70 units QHS ?Continue Novolog 10 units TID with meals ?Continue Novolog 1 unit for every 25 points above CBG 150 ? ? ?Addendum 11:20am--Met w/ pt at bedside this AM.  Pt not very talkative but answered all my questions regarding her home insulin regimen.  Per pt, she takes Toujeo 80 units Once Daily (usually the AM), Novolog 10 units TID with meals, Does not follow the Novolog Correction Ratio her ENDO have her, and also takes Metformin 1000 mg BID.  We reviewed her current A1c of 10% (which is down from 11% at last ENDO visit on 03/14/2022.  Pt told me her foot wound just appeared a few days prior to admission.  Uses Dexcom CGM at home for glucose  monitoring.  We talked about the importance of good CBG control both in the hospital and at home for wound healing and prevention of further wounds.  Sees her ENDO about evert 3 months--Next appt with ENDO is 07/14/2022.  Pt did not have any questions for me regarding her inpatient insulin regimen.  Expressed concern that her CBG was only 72 this AM--We reviewed my request to the MD to reduce the Basal insulin that is currently ordered.   Asked about seeing a Nutritionist--I told her I did not see an order for an RD consult but would inquire with the RN.   ? ? ? ?--Will follow patient during hospitalization-- ? ?Wyn Quaker RN, MSN, CDE ?Diabetes Coordinator ?Inpatient Glycemic Control Team ?Team Pager: [phone] (8a-5p) ? ? ? ?

## 2022-04-01 ENCOUNTER — Inpatient Hospital Stay (HOSPITAL_COMMUNITY): Payer: Medicare Other

## 2022-04-01 DIAGNOSIS — I739 Peripheral vascular disease, unspecified: Secondary | ICD-10-CM

## 2022-04-01 DIAGNOSIS — L03116 Cellulitis of left lower limb: Secondary | ICD-10-CM

## 2022-04-01 DIAGNOSIS — L0390 Cellulitis, unspecified: Secondary | ICD-10-CM

## 2022-04-01 LAB — CBC WITH DIFFERENTIAL/PLATELET
Abs Immature Granulocytes: 0.01 10*3/uL (ref 0.00–0.07)
Basophils Absolute: 0 10*3/uL (ref 0.0–0.1)
Basophils Relative: 0 %
Eosinophils Absolute: 0.2 10*3/uL (ref 0.0–0.5)
Eosinophils Relative: 3 %
HCT: 37.4 % (ref 36.0–46.0)
Hemoglobin: 11.7 g/dL — ABNORMAL LOW (ref 12.0–15.0)
Immature Granulocytes: 0 %
Lymphocytes Relative: 24 %
Lymphs Abs: 1.7 10*3/uL (ref 0.7–4.0)
MCH: 27.1 pg (ref 26.0–34.0)
MCHC: 31.3 g/dL (ref 30.0–36.0)
MCV: 86.6 fL (ref 80.0–100.0)
Monocytes Absolute: 0.7 10*3/uL (ref 0.1–1.0)
Monocytes Relative: 10 %
Neutro Abs: 4.5 10*3/uL (ref 1.7–7.7)
Neutrophils Relative %: 63 %
Platelets: 276 10*3/uL (ref 150–400)
RBC: 4.32 MIL/uL (ref 3.87–5.11)
RDW: 13.8 % (ref 11.5–15.5)
WBC: 7.2 10*3/uL (ref 4.0–10.5)
nRBC: 0 % (ref 0.0–0.2)

## 2022-04-01 LAB — GLUCOSE, CAPILLARY
Glucose-Capillary: 86 mg/dL (ref 70–99)
Glucose-Capillary: 120 mg/dL — ABNORMAL HIGH (ref 70–99)

## 2022-04-01 LAB — BASIC METABOLIC PANEL
Anion gap: 8 (ref 5–15)
BUN: 22 mg/dL (ref 8–23)
CO2: 27 mmol/L (ref 22–32)
Calcium: 8.6 mg/dL — ABNORMAL LOW (ref 8.9–10.3)
Chloride: 101 mmol/L (ref 98–111)
Creatinine, Ser: 0.81 mg/dL (ref 0.44–1.00)
GFR, Estimated: >60 mL/min (ref >60)
Glucose, Bld: 56 mg/dL — ABNORMAL LOW (ref 70–99)
Potassium: 3.5 mmol/L (ref 3.5–5.1)
Sodium: 136 mmol/L (ref 135–145)

## 2022-04-01 MED ORDER — HYDRALAZINE HCL 50 MG PO TABS
100.0000 mg | ORAL_TABLET | Freq: Three times a day (TID) | ORAL | Status: DC
Start: 2022-04-01 — End: 2022-04-01

## 2022-04-01 MED ORDER — SODIUM CHLORIDE 0.9 % IV SOLN
3.0000 g | Freq: Four times a day (QID) | INTRAVENOUS | Status: DC
  Administered 2022-04-01: 3 g via INTRAVENOUS
  Filled 2022-04-01: qty 8

## 2022-04-01 MED ORDER — DOXYCYCLINE MONOHYDRATE 100 MG PO TABS: 100.0000 mg | ORAL_TABLET | Freq: Two times a day (BID) | ORAL | 0 refills | Status: DC

## 2022-04-01 MED ORDER — ACETAMINOPHEN 325 MG PO TABS: 650.0000 mg | ORAL_TABLET | Freq: Four times a day (QID) | ORAL | Status: DC | PRN

## 2022-04-01 MED ORDER — OXYCODONE-ACETAMINOPHEN 5-325 MG PO TABS
1.0000 | ORAL_TABLET | Freq: Four times a day (QID) | ORAL | 0 refills | Status: DC | PRN
Start: 2022-04-01 — End: 2022-04-24

## 2022-04-01 MED ORDER — MUPIROCIN CALCIUM 2 % EX CREA: TOPICAL_CREAM | Freq: Every day | CUTANEOUS | 0 refills | Status: DC

## 2022-04-01 MED ORDER — POTASSIUM CHLORIDE CRYS ER 20 MEQ PO TBCR: 40.0000 meq | EXTENDED_RELEASE_TABLET | Freq: Once | ORAL | Status: DC

## 2022-04-01 MED ORDER — INSULIN GLARGINE-YFGN 100 UNIT/ML ~~LOC~~ SOLN
35.0000 [IU] | Freq: Two times a day (BID) | SUBCUTANEOUS | Status: DC
  Filled 2022-04-01 (×2): qty 0.35

## 2022-04-01 MED ORDER — AMOXICILLIN-POT CLAVULANATE 875-125 MG PO TABS: 1.0000 | ORAL_TABLET | Freq: Two times a day (BID) | ORAL | 0 refills | Status: DC

## 2022-04-01 MED ORDER — POTASSIUM CHLORIDE CRYS ER 20 MEQ PO TBCR
40.0000 meq | EXTENDED_RELEASE_TABLET | Freq: Once | ORAL | Status: AC
  Administered 2022-04-01: 40 meq via ORAL
  Filled 2022-04-01: qty 2

## 2022-04-01 NOTE — Progress Notes (Signed)
I was contacted by Dr. Cannon Kettle with podiatry about the option of outpatient arteriogram as patient would like some time at home and is overwhelmed by the thought of additional toe amputation.  Discussed I would be happy to offer her outpatient arteriogram this Thursday in the cath lab and my office will arrange.  I will then communicate with podiatry after her procedure.  Have updated the patient at bedside and she is agreeable. ? ?Marty Heck, MD ?[facility] ?Office: [phone] ? ? ?Marty Heck ? ?

## 2022-04-01 NOTE — Plan of Care (Signed)

## 2022-04-01 NOTE — Progress Notes (Signed)
Pharmacy Antibiotic Note ? ?Katherine [name] is a 67 y.o. female admitted on 03/29/2022 with  osteomyelitis .  Pharmacy has been consulted for Unasyn dosing. ? ?Plan: ?Unasyn 3g IV Q6H. ? ?Weight: 101.4 kg (223 lb 8.7 oz) ? ?Temp (24hrs), Avg:98.5 ?F (36.9 ?C), Min:97.8 ?F (36.6 ?C), Max:99 ?F (37.2 ?C) ? ?Recent Labs  ?Lab 03/29/22 ?1758 03/29/22 ?2030 03/30/22 ?0109 03/31/22 ?0225 04/01/22 ?0132  ?WBC 8.0  --  8.8 6.7 7.2  ?CREATININE 1.06*  --  0.78 1.05* 0.81  ?LATICACIDVEN 0.9 1.2  --   --   --   ?  ?Estimated Creatinine Clearance: 92.3 mL/min (by C-G formula based on SCr of 0.81 mg/dL).   ? ?Allergies  ?Allergen Reactions  ? Other Swelling  ?  Seaweed= swelling on arms, hands and face  ? Metformin And Related Nausea Only  ? ? ? ?Thank you for allowing pharmacy to be a part of this patient?s care. ? ?Wynona Neat, PharmD, BCPS  ?04/01/2022 6:27 AM ? ?

## 2022-04-01 NOTE — Progress Notes (Signed)
Subjective: ?Katherine [name] is a 66 y.o. female patient seen at bedside, resting comfortably in no acute distress for follow up evaluation left foot infection and osteomyelitis with PAD.  Patient is noted to be teary-eyed during this encounter and is still very overwhelmed.  Patient states that she cannot look at her foot and come to terms with things.  Patient denies any other current symptoms at this time. ? ?Patient Active Problem List  ? Diagnosis Date Noted  ? Acute osteomyelitis of left foot ([place]) 03/29/2022  ? Neuropathy 12/08/2021  ? Diabetic gastroparesis associated with type 2 diabetes mellitus ([place]) 07/30/2021  ? Type 2 diabetes mellitus with diabetic peripheral angiopathy without gangrene, with long-term current use of insulin ([place]) 07/30/2021  ? Amputation of left great toe ([place]) 06/13/2021  ? Microalbuminuria due to type 2 diabetes mellitus ([place]) 05/04/2021  ? Diabetes ([place]) 12/22/2019  ? Renal artery stenosis ([place]) 12/01/2019  ? Diabetic polyneuropathy associated with type 2 diabetes mellitus ([place]) 05/14/2018  ? Thyroid nodule 10/04/2017  ? Microcytic anemia 09/11/2017  ? Critical lower limb ischemia ([place]) 09/10/2017  ? Retinopathy 08/22/2017  ? Thyroid enlargement 07/12/2017  ? Dyslipidemia 09/20/2016  ? DEPRESSION 09/10/2009  ? LEG CRAMPS 05/19/2009  ? GERD 02/15/2009  ? Gastroparesis 09/03/2008  ? DENTAL CARIES 07/14/2008  ? Essential hypertension 09/16/2003  ? ? ? ?Current Facility-Administered Medications:  ?  acetaminophen (TYLENOL) tablet 650 mg, 650 mg, Oral, Q6H PRN **OR** acetaminophen (TYLENOL) suppository 650 mg, 650 mg, Rectal, Q6H PRN, Kristopher Oppenheim, DO ?  amLODipine (NORVASC) tablet 10 mg, 10 mg, Oral, Daily, Kristopher Oppenheim, DO, 10 mg at 04/01/22 0827 ?  Ampicillin-Sulbactam (UNASYN) 3 g in sodium chloride 0.9 % 100 mL IVPB, 3 g, Intravenous, Q6H, Bryk, Veronda P, RPH, Last Rate: 200 mL/hr at 04/01/22 1106, 3 g at 04/01/22 1106 ?  atorvastatin (LIPITOR) tablet 80 mg, 80 mg, Oral, q1800, Kristopher Oppenheim, DO, 80 mg at 03/31/22 1807 ?  carvedilol (COREG) tablet 6.25 mg, 6.25 mg, Oral, BID WC, Kristopher Oppenheim, DO, 6.25 mg at 04/01/22 0827 ?  heparin injection 5,000 Units, 5,000 Units, Subcutaneous, Q8H, Kristopher Oppenheim, DO, 5,000 Units at 04/01/22 0630 ?  hydrALAZINE (APRESOLINE) tablet 100 mg, 100 mg, Oral, TID, Eugenie Filler, MD ?  insulin aspart (novoLOG) injection 0-20 Units, 0-20 Units, Subcutaneous, TID WC, Kristopher Oppenheim, DO, 3 Units at 03/31/22 1700 ?  insulin aspart (novoLOG) injection 0-5 Units, 0-5 Units, Subcutaneous, QHS, Kristopher Oppenheim, DO ?  insulin glargine-yfgn [facility]) injection 35 Units, 35 Units, Subcutaneous, BID, Eugenie Filler, MD ?  lisinopril (ZESTRIL) tablet 10 mg, 10 mg, Oral, Daily, Kristopher Oppenheim, DO, 10 mg at 04/01/22 0827 ?  mupirocin cream (BACTROBAN) 2 %, , Topical, Daily, Velvet Moomaw, DPM ?  ondansetron (ZOFRAN) tablet 4 mg, 4 mg, Oral, Q6H PRN **OR** ondansetron (ZOFRAN) injection 4 mg, 4 mg, Intravenous, Q6H PRN, Kristopher Oppenheim, DO ?  oxyCODONE-acetaminophen (PERCOCET/ROXICET) 5-325 MG per tablet 1 tablet, 1 tablet, Oral, Q6H PRN, Kristopher Oppenheim, DO, 1 tablet at 03/31/22 2153 ?  vancomycin (VANCOREADY) IVPB 1500 mg/300 mL, 1,500 mg, Intravenous, Q24H, Alvira Philips, [place], Last Rate: 150 mL/hr at 04/01/22 0528, 1,500 mg at 04/01/22 1601 ? ?Allergies  ?Allergen Reactions  ? Other Swelling  ?  Seaweed= swelling on arms, hands and face  ? Metformin And Related Nausea Only  ? ? ? ?Objective: ?Today's Vitals  ? 04/01/22 0500 04/01/22 0602 04/01/22 0818 04/01/22 0857  ?BP:  (!) 177/64 (!) 180/63   ?  Pulse:  61 61   ?Resp:  18 18   ?Temp:  98.4 ?F (36.9 ?C) 98.2 ?F (36.8 ?C)   ?TempSrc:   Axillary   ?SpO2:  96% 100%   ?Weight: 101.4 kg     ?PainSc:    2   ? ? ?General: No acute distress ? ?Left Lower extremity:[place] clean dry.  Dressing in place to the left foot which appears to be clean dry and intact with no strikethrough.  No pain with palpation to the left foot. ? ?Assessment and Plan:   ?Problem List Items Addressed This Visit   ?None ?Visit Diagnoses   ? ? Cellulitis of left lower extremity    -  Primary  ? Other acute osteomyelitis of left foot ([place])      ? Relevant Medications  ? vancomycin (VANCOREADY) IVPB 2000 mg/400 mL (Completed)  ? vancomycin (VANCOREADY) IVPB 1500 mg/300 mL  ? mupirocin cream (BACTROBAN) 2 %  ? ?  ? ? ?-Patient seen and evaluated at bedside along with vascular ?-Re-Discussed with patient treatment options for osteomyelitis and cellulitis of left foot ?-Patient is still very overwhelmed but is agreeable for outpatient management and seems relieved with the idea of going home and coming back at a later time for arteriogram and vascular intervention with Dr. Carlis Abbott with close podiatry follow-up to later plan amputation when she has come to terms with things ?-Continue with antibiotics as directed by infectious disease ?-Continue with antibiotic cream and dry dressing left foot as ordered and patient was instructed as well on how to properly do this; She is already scheduled to see Dr. Sherryle Lis in office to also have [place] to assist with dressing changes as scheduled on Tuesday, 04/04/2022 ?-Continue with limited weightbearing to tolerance with surgical shoe ?-Patient stable from podiatry point of view for discharge with close podiatry outpatient follow-up and vascular intervention as scheduled outpatient as well. ? ?Dr. Landis Martins ?On Call provider  ?[facility] ?[phone] office ?[phone] cell ?Available via secure chat   ?

## 2022-04-01 NOTE — Progress Notes (Signed)
ABI exam has been completed. ? ?Results can be found under chart review under CV PROC. ?04/01/2022 9:48 AM ?Concha Sudol RVT, RDMS ? ?

## 2022-04-01 NOTE — Progress Notes (Signed)
Katherine [name] to be D/C'd  per MD order.  Discussed with the patient and all questions fully answered. ? ?VSS, Skin clean, dry and intact without evidence of skin break down, no evidence of skin tears noted. ? ?IV catheter discontinued intact. Site without signs and symptoms of complications. Dressing and pressure applied. ? ?An After Visit Summary was printed and given to the patient. Patient received prescription. ? ?D/c education completed with patient/family including follow up instructions, medication list, d/c activities limitations if indicated, with other d/c instructions as indicated by MD - patient able to verbalize understanding, all questions fully answered.  ? ?Patient instructed to return to ED, call 911, or call MD for any changes in condition.  ? ?Patient to be escorted via [place], and D/C home via private auto.  ?

## 2022-04-01 NOTE — Progress Notes (Signed)
[facility] ? ?Subjective  -no complaints. ? ? ?Objective ?(!) 180/63 ?61 ?98.2 ?F (36.8 ?C) (Axillary) ?18 ?100% ? ?Intake/Output Summary (Last 24 hours) at 04/01/2022 1053 ?Last data filed at 04/01/2022 7681 ?Gross per 24 hour  ?Intake 520 ml  ?Output 1950 ml  ?Net -1430 ml  ? ? ?Left AT palpable ? ?Laboratory ?Lab Results: ?Recent Labs  ?  03/31/22 ?0225 04/01/22 ?1572  ?WBC 6.7 7.2  ?HGB 11.9* 11.7*  ?HCT 38.4 37.4  ?PLT 151 276  ? ?BMET ?Recent Labs  ?  03/31/22 ?0225 04/01/22 ?0132  ?NA 133* 136  ?K 3.9 3.5  ?CL 98 101  ?CO2 22 27  ?GLUCOSE 125* 56*  ?BUN 27* 22  ?CREATININE 1.05* 0.81  ?CALCIUM 8.4* 8.6*  ? ? ?COAG ?Lab Results  ?Component Value Date  ? INR 1.1 09/05/2017  ? INR 1.05 12/11/2014  ? INR 1.04 12/06/2012  ? ?No results found for: PTT ? ?Assessment/Planning: ? ?66 year old female previous underwent left anterior tibial recanalization by Dr. Gwenlyn Found in 2018.  Vascular surgery was consulted yesterday given Dr. Gwenlyn Found is out of town.  She now has evidence of osteomyelitis of the left fifth toe.  Duplex from March suggested a high-grade stenosis in the anterior tibial.  I think patient would benefit from lower extremity arteriogram prior to toe amputation to ensure she is optimized from an inflow standpoint given the stenosis noted on duplex.  She states she is amendable to whoever can get to her first next week.  We will see how early we can get her on the schedule this week for arteriogram with possible intervention. ? ?Marty Heck ?04/01/2022 ?10:53 AM ?-- ? ? ?

## 2022-04-01 NOTE — Progress Notes (Signed)
Brief ID Note: ? ?Notified by primary hospitalist that patient was planning to be discharged later today.  Vascular surgery has seen the patient who believes that she would benefit from lower extremity arteriogram prior to undergoing toe amputation in the setting of osteomyelitis of the left fifth toe.  This is to ensure she is optimized from a blood flow standpoint in order to heal properly.  She will be scheduled as an outpatient on Thursday in the Cath Lab for this procedure.  At that time she will follow-up with podiatry to discuss amputation of her toe for osteomyelitis.  Will discharge home on doxycycline and Augmentin and have patient follow-up with Dr. West Bali on 04/10/2022. ? ? ?Mignon Pine ?[facility] ?[facility] ?04/01/2022, 2:55 PM ? ?

## 2022-04-01 NOTE — Discharge Summary (Signed)
Physician Discharge Summary  ?Katherine [name] TKW:[id number] DOB: Nov 08, 1956 DOA: 03/29/2022 ? ?PCP: Ladell Pier, MD ? ?Admit date: 03/29/2022 ?Discharge date: 04/01/2022 ? ?Time spent: 60 minutes ? ?Recommendations for Outpatient Follow-up:  ?Follow-up with Ladell Pier, MD in 2 weeks.  On follow-up patient's diabetes, blood pressure need to be reassessed.  Patient will need a basic metabolic profile done to follow-up on electrolytes and renal function. ?Follow-up with Dr. West Bali, infectious disease on 04/10/2022 at 11 AM. ?Follow-up with Dr. Sherryle Lis, podiatry as scheduled on 04/04/2022. ?Follow-up with Dr. Monica Martinez, vascular surgery on 04/06/2022 to be set up in the Cath Lab for arteriogram. ? ? ?Discharge Diagnoses:  ?Principal Problem: ?  Acute osteomyelitis of left foot ([place]) ?Active Problems: ?  Essential hypertension ?  Gastroparesis ?  Dyslipidemia ?  Type 2 diabetes mellitus with diabetic peripheral angiopathy without gangrene, with long-term current use of insulin ([place]) ? ? ?Discharge Condition: Stable and improved. ? ?Diet recommendation: Carb modified diet ? ?Filed Weights  ? 03/30/22 0800 03/31/22 0445 04/01/22 0500  ?Weight: 100.4 kg 100.3 kg 101.4 kg  ? ? ?History of present illness:  ?HPI per Dr. Bridgett Larsson ?66 year old African-American female history of type 2 diabetes, polyneuropathy, hypertension, gastroparesis, hyperlipidemia, left foot wound presents to the ER today from podiatry office.  Patient's had a wound on her left foot since at least March 12, 2022.  She has been followed by podiatry.  She states that she has been visiting her husband in the hospital.  She has been walking on her left foot.  She states that over the weekend, her foot has increased in size, erythema and pain.  Patient seen by podiatry today.  Clinic x-rays were taken.  Showed osteomyelitis of the left foot.  Patient sent to the ER for evaluation. ?  ?X-ray of the left foot demonstrated lytic lesion of her left  foot consistent with osteomyelitis. ?  ?[facility] hospitalist contacted for admission.  ?  ?ED Course: Foot x-rays showed lytic lesion consistent with osteomyelitis. ?Hospital Course:  ? ?Assessment and Plan: ?* Acute osteomyelitis of left foot ([place]) ?-Patient sent to ED by podiatry due to concerns for osteomyelitis of the left foot . ?-Plain films done with lytic destruction involving the distal portion of the fifth metatarsal and fifth proximal phalanx consistent with osteomyelitis . ?-MRI left foot done concerning for osteomyelitis within the proximal phalanx greater than metatarsal head of the fifth toe with marrow edema extending throughout the distal 50% of the fifth metatarsal, soft tissue ulceration lateral to the fifth toe, postsurgical changes of amputation of the first ray.   ?-ABIs done with resting right ankle-brachial index within normal range, no evidence of significant right lower extremity arterial disease.  Right toe brachial index was abnormal.  Left ABI showed only mildly left lower extremity arterial disease, arterial Doppler waveforms at the ankle suggest some component of more moderate arterial occlusive disease.  Left great toe was amputated. ?-Patient seen in consultation by podiatry who are recommending vascular evaluation, surgical amputation of fifth toe and distal metatarsal of the left foot which patient declined and wanted to continue weighing her options.   ?-Patient initially placed on IV Rocephin, IV vancomycin, oral Flagyl. ?-ID consulted for antibiotic recommendations and further evaluation and management. ?-Patient seen by ID and antibiotics narrowed to IV vancomycin and IV Unasyn. ?-Vascular surgery consulted and patient seen in consultation by Dr. Carlis Abbott who recommended lower extremity arteriogram prior to undergoing toe amputation in the setting of  osteomyelitis of the left foot however patient wanted to wait her options prior to that and as such patient will be scheduled as an  outpatient on Thursday in the Cath Lab for this procedure to be done.  Procedures to ensure optimization of blood flow in order to heal properly. ?-Patient also followed by podiatry. ?-Case discussed with ID who had recommended discharging patient on Augmentin and doxycycline, patient written a prescription for 4 weeks with outpatient follow-up with ID, Dr.Manandhar 04/10/2022. ?-Patient will follow-up with podiatry in the outpatient setting as well as vascular surgery and ID. ?-Patient was discharged in stable condition. ?-Per podiatry. ? ?Type 2 diabetes mellitus with diabetic peripheral angiopathy without gangrene, with long-term current use of insulin ([place]) ?Uncontrolled.  ?-Hemoglobin A1c 10.0 (03/29/2022). ?-CBG 72 this morning. ?-Patient's oral hypoglycemic agents were held during the hospitalization and patient maintained on Semglee 40 units twice daily, SSI. ?-Patient seen by diabetic coordinator. ?-Outpatient follow-up with PCP. ? ?Dyslipidemia ?-Patient maintained on home regimen atorvastatin.  ? ?Gastroparesis ?Stable. ? ?Essential hypertension ?- Controlled on current regimen of Norvasc 10 mg daily, Coreg 6.25 milligrams twice daily, hydralazine 100 mg 3 times daily, lisinopril 10 mg daily.  ?-Patient insistent she takes hydralazine twice a day not 3 times a day and requested it to be changed. ?-Patient will be discharged back home on home regimen of Norvasc, Coreg, hydralazine, lisinopril. ?-Outpatient follow-up with PCP. ? ? ? ? ?  ? ?Procedures: ?Plain films left foot 03/29/2022 ?MRI left foot 03/29/2022 ?ABI: 04/01/2022 ? ?Consultations: ?Podiatry.  Dr. Cannon Kettle 03/30/2022 ?Infectious disease: Dr. West Bali 03/31/2022 ?Vascular surgery: Dr. Monica Martinez 03/31/2022 ? ?Discharge Exam: ?Vitals:  ? 04/01/22 0602 04/01/22 0818  ?BP: (!) 177/64 (!) 180/63  ?Pulse: 61 61  ?Resp: 18 18  ?Temp: 98.4 ?F (36.9 ?C) 98.2 ?F (36.8 ?C)  ?SpO2: 96% 100%  ? ? ?General: NAD ?Cardiovascular: Regular rate rhythm no murmurs  rubs or gallops.  No JVD.  1-2+ left lower extremity edema ?Respiratory: Clear to auscultation bilaterally.  No wheezes, no crackles, no rhonchi. ? ?Discharge Instructions ? ? ?Discharge Instructions   ? ? Diet Carb Modified   Complete by: As directed ?  ? Discharge wound care:   Complete by: As directed ?  ? Every shift      ?Comments: Bactroban cream and dry dressing to left foot plantar 5th MTPJ wound once daily  ? Increase activity slowly   Complete by: As directed ?  ? ?  ? ?Allergies as of 04/01/2022   ? ?   Reactions  ? Other Swelling  ? Seaweed= swelling on arms, hands and face  ? Metformin And Related Nausea Only  ? ?  ? ?  ?Medication List  ?  ? ?STOP taking these medications   ? ?ciclopirox 8 % solution ?Commonly known as: Penlac ?  ?ciprofloxacin 500 MG tablet ?Commonly known as: Cipro ?  ?meloxicam 7.5 MG tablet ?Commonly known as: MOBIC ?  ?metFORMIN 1000 MG tablet ?Commonly known as: GLUCOPHAGE ?  ? ?  ? ?TAKE these medications   ? ?acetaminophen 325 MG tablet ?Commonly known as: TYLENOL ?Take 2 tablets (650 mg total) by mouth every 6 (six) hours as needed for mild pain (or Fever >/= 101). ?  ?amLODipine 10 MG tablet ?Commonly known as: NORVASC ?Take 1 tablet (10 mg total) by mouth daily. ?  ?amoxicillin-clavulanate 875-125 MG tablet ?Commonly known as: Augmentin ?Take 1 tablet by mouth 2 (two) times daily for 28 days. ?  ?atorvastatin  80 MG tablet ?Commonly known as: LIPITOR ?Take 1 tablet (80 mg total) by mouth daily at 6 PM. ?What changed: when to take this ?  ?Baqsimi One Pack 3 MG/DOSE Powd ?Generic drug: Glucagon ?Place 1 spray into the nose as needed (Low blood sugar). ?  ?carvedilol 6.25 MG tablet ?Commonly known as: COREG ?Take 1 tablet (6.25 mg total) by mouth 2 (two) times daily with a meal. ?What changed: when to take this ?  ?clopidogrel 75 MG tablet ?Commonly known as: PLAVIX ?TAKE 1 TABLET BY MOUTH DAILY WITH BREAKFAST. ?What changed:  ?how much to take ?how to take this ?when to take  this ?  ?doxycycline 100 MG tablet ?Commonly known as: ADOXA ?Take 1 tablet (100 mg total) by mouth 2 (two) times daily for 28 days. ?  ?EYE DROPS OP ?Place 1 drop into both eyes daily. ?  ?FreeStyle [employer]

## 2022-04-03 ENCOUNTER — Other Ambulatory Visit: Payer: Self-pay

## 2022-04-03 ENCOUNTER — Telehealth: Payer: Self-pay

## 2022-04-03 DIAGNOSIS — M79671 Pain in right foot: Secondary | ICD-10-CM | POA: Diagnosis not present

## 2022-04-03 DIAGNOSIS — I739 Peripheral vascular disease, unspecified: Secondary | ICD-10-CM

## 2022-04-03 DIAGNOSIS — M86172 Other acute osteomyelitis, left ankle and foot: Secondary | ICD-10-CM

## 2022-04-03 LAB — CULTURE, BLOOD (ROUTINE X 2)
Culture: NO GROWTH
Culture: NO GROWTH
Special Requests: ADEQUATE

## 2022-04-03 NOTE — Telephone Encounter (Signed)
Transition Care Management Follow-up Telephone Call ?Date of discharge and from where: 04/01/2022, [facility]  ?How have you been since you were released from the hospital? She said she is not bad, wanted to come home. She has an aortogram scheduled for 04/06/2022 and did not want to wait in the hospital until then. She also said that her husband was in the hospital last week and he is not able to walk now that he is home. He was getting ready to start dialysis.  ?Any questions or concerns? Yes- she is concerned about her limited mobility. I explained to her that she may qualify for PCS if she is interested.  ?She has a Dexcom but said it is not working and she needs to call the company.  In the meantime, she has a traditional glucometer for monitoring her blood sugars  ? ?Items Reviewed: ?Did the pt receive and understand the discharge instructions provided? Yes  ?Medications obtained and verified? Yes  - she said she has all of her medications and did not have any questions about the med regime.  ?Other? No  ?Any new allergies since your discharge? No  ?Dietary orders reviewed? No ?Do you have support at home? Yes  ? ?Home Care and Equipment/Supplies: ?Were home health services ordered? no ?If so, what is the name of the agency? N/a  ?Has the agency set up a time to come to the patient's home? not applicable ?Were any new equipment or medical supplies ordered?  No ?What is the name of the medical supply agency? N/a ?Were you able to get the supplies/equipment? not applicable ?Do you have any questions related to the use of the equipment or supplies? No ? ?Functional Questionnaire: (I = Independent and D = Dependent) ?ADLs: ambulates with RW.  She said she understands that she is  to keep weight off of her left foot.  She said she has some help available to assist with ADLs if needed.  ? ?Podiatry/ wound care are managing her left foot dressing changes. She said she does not have to do any wound care and  will be seen by podiatry/wound care every 4 days.  ? ?Follow up appointments reviewed: ? ?PCP Hospital f/u appt confirmed?  Dr Wynetta Emery - 05/15/2022, She did not want to schedule an appointment to be seen sooner because she has multiple appointments with specialists.  Marland Kitchen ?[facility] f/u appt confirmed? Yes  Scheduled to see Podiatry- 04/04/2022, Vascular surgery- 04/06/2022; [facility]- 04/10/2022.   ?Are transportation arrangements needed?  She has a car but can't drive and has to ask friends to drive her. She is aware that [facility] Medicare and Medicaid will provide transportation to medical appointments and she just has to call them and schedule the rides.  ?If their condition worsens, is the pt aware to call PCP or go to the Emergency Dept.? Yes ?Was the patient provided with contact information for the PCP's office or ED? Yes ?Was to pt encouraged to call back with questions or concerns? Yes ? ?

## 2022-04-04 ENCOUNTER — Ambulatory Visit (INDEPENDENT_AMBULATORY_CARE_PROVIDER_SITE_OTHER): Payer: Medicare Other | Admitting: Podiatry

## 2022-04-04 ENCOUNTER — Other Ambulatory Visit: Payer: Medicare Other

## 2022-04-04 DIAGNOSIS — E08621 Diabetes mellitus due to underlying condition with foot ulcer: Secondary | ICD-10-CM

## 2022-04-04 DIAGNOSIS — L97424 Non-pressure chronic ulcer of left heel and midfoot with necrosis of bone: Secondary | ICD-10-CM

## 2022-04-05 DIAGNOSIS — L97424 Non-pressure chronic ulcer of left heel and midfoot with necrosis of bone: Secondary | ICD-10-CM | POA: Diagnosis not present

## 2022-04-06 ENCOUNTER — Encounter (HOSPITAL_COMMUNITY): Payer: Self-pay | Admitting: Vascular Surgery

## 2022-04-06 ENCOUNTER — Ambulatory Visit: Payer: Medicare Other | Admitting: Podiatry

## 2022-04-06 ENCOUNTER — Ambulatory Visit (HOSPITAL_COMMUNITY)
Admission: RE | Admit: 2022-04-06 | Discharge: 2022-04-06 | Disposition: A | Discharge status: Home / Self Care | Payer: Medicare Other | Attending: Vascular Surgery | Admitting: Vascular Surgery

## 2022-04-06 ENCOUNTER — Other Ambulatory Visit: Payer: Self-pay

## 2022-04-06 ENCOUNTER — Encounter (HOSPITAL_COMMUNITY)
Admission: RE | Disposition: A | Discharge status: Home / Self Care | Payer: Self-pay | Source: Home / Self Care | Attending: Vascular Surgery

## 2022-04-06 DIAGNOSIS — E1140 Type 2 diabetes mellitus with diabetic neuropathy, unspecified: Secondary | ICD-10-CM | POA: Diagnosis not present

## 2022-04-06 DIAGNOSIS — M86172 Other acute osteomyelitis, left ankle and foot: Secondary | ICD-10-CM

## 2022-04-06 DIAGNOSIS — Z89422 Acquired absence of other left toe(s): Secondary | ICD-10-CM | POA: Insufficient documentation

## 2022-04-06 DIAGNOSIS — Z794 Long term (current) use of insulin: Secondary | ICD-10-CM | POA: Diagnosis not present

## 2022-04-06 DIAGNOSIS — I739 Peripheral vascular disease, unspecified: Secondary | ICD-10-CM

## 2022-04-06 DIAGNOSIS — E785 Hyperlipidemia, unspecified: Secondary | ICD-10-CM | POA: Insufficient documentation

## 2022-04-06 DIAGNOSIS — I10 Essential (primary) hypertension: Secondary | ICD-10-CM | POA: Insufficient documentation

## 2022-04-06 DIAGNOSIS — M869 Osteomyelitis, unspecified: Secondary | ICD-10-CM | POA: Insufficient documentation

## 2022-04-06 DIAGNOSIS — E1151 Type 2 diabetes mellitus with diabetic peripheral angiopathy without gangrene: Secondary | ICD-10-CM | POA: Insufficient documentation

## 2022-04-06 DIAGNOSIS — Z7985 Long-term (current) use of injectable non-insulin antidiabetic drugs: Secondary | ICD-10-CM | POA: Diagnosis not present

## 2022-04-06 HISTORY — PX: PERIPHERAL VASCULAR BALLOON ANGIOPLASTY: CATH118281

## 2022-04-06 HISTORY — PX: ABDOMINAL AORTOGRAM W/LOWER EXTREMITY: CATH118223

## 2022-04-06 LAB — POCT I-STAT, CHEM 8
BUN: 20 mg/dL (ref 8–23)
BUN: 14 mg/dL (ref 8–23)
Calcium, Ion: 1.14 mmol/L — ABNORMAL LOW (ref 1.15–1.40)
Calcium, Ion: 1.01 mmol/L — ABNORMAL LOW (ref 1.15–1.40)
Chloride: 100 mmol/L (ref 98–111)
Chloride: 104 mmol/L (ref 98–111)
Creatinine, Ser: 0.8 mg/dL (ref 0.44–1.00)
Creatinine, Ser: 0.6 mg/dL (ref 0.44–1.00)
Glucose, Bld: 135 mg/dL — ABNORMAL HIGH (ref 70–99)
Glucose, Bld: 141 mg/dL — ABNORMAL HIGH (ref 70–99)
HCT: 34 % — ABNORMAL LOW (ref 36.0–46.0)
HCT: 36 % (ref 36.0–46.0)
Hemoglobin: 11.6 g/dL — ABNORMAL LOW (ref 12.0–15.0)
Hemoglobin: 12.2 g/dL (ref 12.0–15.0)
Potassium: 5.9 mmol/L — ABNORMAL HIGH (ref 3.5–5.1)
Potassium: 3.5 mmol/L (ref 3.5–5.1)
Sodium: 139 mmol/L (ref 135–145)
Sodium: 139 mmol/L (ref 135–145)
TCO2: 36 mmol/L — ABNORMAL HIGH (ref 22–32)
TCO2: 29 mmol/L (ref 22–32)

## 2022-04-06 LAB — POCT ACTIVATED CLOTTING TIME
Activated Clotting Time: 269 seconds
Activated Clotting Time: 347 seconds

## 2022-04-06 LAB — GLUCOSE, CAPILLARY: Glucose-Capillary: 138 mg/dL — ABNORMAL HIGH (ref 70–99)

## 2022-04-06 SURGERY — ABDOMINAL AORTOGRAM W/LOWER EXTREMITY
Anesthesia: LOCAL

## 2022-04-06 MED ORDER — ASPIRIN 81 MG PO TBEC
81.0000 mg | DELAYED_RELEASE_TABLET | Freq: Every day | ORAL | Status: DC
  Administered 2022-04-06: 81 mg via ORAL
  Filled 2022-04-06: qty 1

## 2022-04-06 MED ORDER — LIDOCAINE HCL (PF) 1 % IJ SOLN
INTRAMUSCULAR | Status: DC | PRN
  Administered 2022-04-06: 20 mL via INTRADERMAL

## 2022-04-06 MED ORDER — HEPARIN SODIUM (PORCINE) 1000 UNIT/ML IJ SOLN
INTRAMUSCULAR | Status: AC
  Filled 2022-04-06: qty 10

## 2022-04-06 MED ORDER — SODIUM CHLORIDE 0.9 % IV SOLN: 250.0000 mL | INTRAVENOUS | Status: DC | PRN

## 2022-04-06 MED ORDER — ONDANSETRON HCL 4 MG/2ML IJ SOLN: 4.0000 mg | Freq: Four times a day (QID) | INTRAMUSCULAR | Status: DC | PRN

## 2022-04-06 MED ORDER — FENTANYL CITRATE (PF) 100 MCG/2ML IJ SOLN
INTRAMUSCULAR | Status: DC | PRN
  Administered 2022-04-06 (×2): 25 ug via INTRAVENOUS

## 2022-04-06 MED ORDER — IODIXANOL 320 MG/ML IV SOLN
INTRAVENOUS | Status: DC | PRN
  Administered 2022-04-06: 105 mL via INTRA_ARTERIAL

## 2022-04-06 MED ORDER — HYDRALAZINE HCL 20 MG/ML IJ SOLN
INTRAMUSCULAR | Status: AC
  Filled 2022-04-06: qty 1

## 2022-04-06 MED ORDER — SODIUM CHLORIDE 0.9 % IV SOLN: INTRAVENOUS | Status: DC

## 2022-04-06 MED ORDER — SODIUM CHLORIDE 0.9% FLUSH: 3.0000 mL | Freq: Two times a day (BID) | INTRAVENOUS | Status: DC

## 2022-04-06 MED ORDER — LABETALOL HCL 5 MG/ML IV SOLN: 10.0000 mg | INTRAVENOUS | Status: DC | PRN

## 2022-04-06 MED ORDER — HEPARIN SODIUM (PORCINE) 1000 UNIT/ML IJ SOLN
INTRAMUSCULAR | Status: DC | PRN
  Administered 2022-04-06: 10000 [IU] via INTRAVENOUS

## 2022-04-06 MED ORDER — NITROGLYCERIN 1 MG/10 ML FOR IR/CATH LAB
INTRA_ARTERIAL | Status: AC
  Filled 2022-04-06: qty 10

## 2022-04-06 MED ORDER — MIDAZOLAM HCL 2 MG/2ML IJ SOLN
INTRAMUSCULAR | Status: DC | PRN
Start: 2022-04-06 — End: 2022-04-06
  Administered 2022-04-06: 1 mg via INTRAVENOUS

## 2022-04-06 MED ORDER — HEPARIN (PORCINE) IN NACL 1000-0.9 UT/500ML-% IV SOLN
INTRAVENOUS | Status: DC | PRN
  Administered 2022-04-06 (×2): 500 mL

## 2022-04-06 MED ORDER — LIDOCAINE HCL (PF) 1 % IJ SOLN
INTRAMUSCULAR | Status: AC
  Filled 2022-04-06: qty 30

## 2022-04-06 MED ORDER — CLOPIDOGREL BISULFATE 75 MG PO TABS
ORAL_TABLET | ORAL | Status: DC | PRN
  Administered 2022-04-06: 75 mg via ORAL

## 2022-04-06 MED ORDER — NITROGLYCERIN 1 MG/10 ML FOR IR/CATH LAB
INTRA_ARTERIAL | Status: DC | PRN
  Administered 2022-04-06: 200 mL via INTRA_ARTERIAL

## 2022-04-06 MED ORDER — ASPIRIN 81 MG PO TBEC: 81.0000 mg | DELAYED_RELEASE_TABLET | Freq: Every day | ORAL | 2 refills | Status: AC

## 2022-04-06 MED ORDER — FENTANYL CITRATE (PF) 100 MCG/2ML IJ SOLN
INTRAMUSCULAR | Status: AC
  Filled 2022-04-06: qty 2

## 2022-04-06 MED ORDER — HYDRALAZINE HCL 20 MG/ML IJ SOLN: 5.0000 mg | INTRAMUSCULAR | Status: DC | PRN

## 2022-04-06 MED ORDER — ACETAMINOPHEN 325 MG PO TABS: 650.0000 mg | ORAL_TABLET | ORAL | Status: DC | PRN

## 2022-04-06 MED ORDER — MIDAZOLAM HCL 2 MG/2ML IJ SOLN
INTRAMUSCULAR | Status: AC
  Filled 2022-04-06: qty 2

## 2022-04-06 MED ORDER — CLOPIDOGREL BISULFATE 75 MG PO TABS: 75.0000 mg | ORAL_TABLET | Freq: Every day | ORAL | Status: DC

## 2022-04-06 MED ORDER — SODIUM CHLORIDE 0.9% FLUSH: 3.0000 mL | INTRAVENOUS | Status: DC | PRN

## 2022-04-06 MED ORDER — HYDRALAZINE HCL 20 MG/ML IJ SOLN
INTRAMUSCULAR | Status: DC | PRN
  Administered 2022-04-06: 10 mg via INTRAVENOUS

## 2022-04-06 MED ORDER — HEPARIN (PORCINE) IN NACL 1000-0.9 UT/500ML-% IV SOLN
INTRAVENOUS | Status: AC
  Filled 2022-04-06: qty 1000

## 2022-04-06 SURGICAL SUPPLY — 18 items
BALLN STERLING OTW 3X100X150 (BALLOONS) ×3
BALLOON STERLING OTW 3X100X150 (BALLOONS) IMPLANT
CATH CROSS OVER TEMPO 5F (CATHETERS) ×1 IMPLANT
CATH OMNI FLUSH 5F 65CM (CATHETERS) ×1 IMPLANT
CATH QUICKCROSS .018X135CM (MICROCATHETER) ×1 IMPLANT
DEVICE CLOSURE MYNXGRIP 5F (Vascular Products) ×1 IMPLANT
KIT ENCORE 26 ADVANTAGE (KITS) ×1 IMPLANT
KIT MICROPUNCTURE NIT STIFF (SHEATH) ×1 IMPLANT
KIT PV (KITS) ×3 IMPLANT
SHEATH FLEX ANSEL ANG 5F 45CM (SHEATH) ×1 IMPLANT
SHEATH PINNACLE 5F 10CM (SHEATH) ×1 IMPLANT
SHEATH PROBE COVER 6X72 (BAG) ×1 IMPLANT
SYR MEDRAD MARK 7 150ML (SYRINGE) ×1 IMPLANT
TRANSDUCER W/STOPCOCK (MISCELLANEOUS) ×3 IMPLANT
TRAY PV CATH (CUSTOM PROCEDURE TRAY) ×3 IMPLANT
WIRE BENTSON .035X145CM (WIRE) ×1 IMPLANT
WIRE ROSEN-J .035X180CM (WIRE) ×1 IMPLANT
WIRE SHEPHERD 6G .018 (WIRE) ×2 IMPLANT

## 2022-04-06 NOTE — H&P (Signed)
History and Physical Interval Note:  04/06/2022 8:13 AM  Katherine [name]  has presented today for surgery, with the diagnosis of peripheal artery disease with acute osteomylitis left foot..  The various methods of treatment have been discussed with the patient and family. After consideration of risks, benefits and other options for treatment, the patient has consented to  Procedure(s): ABDOMINAL AORTOGRAM W/LOWER EXTREMITY (N/A) as a surgical intervention.  The patient's history has been reviewed, patient examined, no change in status, stable for surgery.  I have reviewed the patient's chart and labs.  Questions were answered to the patient's satisfaction.     [name]       Reason for Consult:  osteomyelitis left foot Referring Physician:  podiatry MRN #:  [id number]   History of Present Illness: [name] with history of diabetes, hypertension hyperlipidemia that vascular surgery has been consulted for left foot wound by podiatry.  She is a patient of Dr. Gwenlyn Found.  She underwent revascularization 09/10/2017 revealing an occluded anterior tibial and posterior tibial artery and per his documentation Dr. Gwenlyn Found recanalized her anterior tibial artery for a foot wound.  Recent left leg duplex shows a high-grade stenosis in the mid anterior tibial artery on 02/14/22.   No flow in PT or peroneal. AT only runoff.  Podiatry has been consulted this admission.  She now has acute osteomyelitis of the left fifth toe.  She has undergone a previous left first toe amputation.       Past Medical History:  Diagnosis Date   Anemia      requiring transfusion   Boil of buttock ~ 2016   Depression     Diabetic neuropathy ([place])      Katherine [name] 08/23/2017   DKA (diabetic ketoacidoses)      recent/notes 01/06/2015   Hyperlipidemia     Hypertension     Neuropathy     Peripheral vascular disease ([place])     Toe ulcer ([place])      left great toe/notes 08/23/2017   Type II  diabetes mellitus ([place])             Past Surgical History:  Procedure Laterality Date   AMPUTATION TOE Left 10/26/2017    Procedure: AMPUTATION PARTIAL RAY LEFT FOOT AND IRRIGATION/DEBRIDEMENT;  Surgeon: Edrick Kins, DPM;  Location: [facility];  Service: Podiatry;  Laterality: Left;  AMPUTATION PARTIAL RAY LEFT FOOT AND IRRIGATION/DEBRIDEMENT   CHOLECYSTECTOMY   01/24/2003    Katherine [name] 04/04/2011   COLONOSCOPY       INCISION AND DRAINAGE ABSCESS   03/17/2012    "right but; left lower abdoment"/notes 03/17/2012   LOWER EXTREMITY INTERVENTION N/A 09/10/2017    Procedure: LOWER EXTREMITY INTERVENTION;  Surgeon: Lorretta Harp, MD;  Location: [place] [facility];  Service: Cardiovascular;  Laterality: N/A;   PERIPHERAL VASCULAR ATHERECTOMY   09/10/2017    Procedure: PERIPHERAL VASCULAR ATHERECTOMY;  Surgeon: Lorretta Harp, MD;  Location: [place] [facility];  Service: Cardiovascular;;  left AT           Allergies  Allergen Reactions   Other Swelling      Seaweed= swelling on arms, hands and face   Metformin And Related Nausea Only             Prior to Admission medications   Medication Sig Start Date End Date Taking? Authorizing Provider  amLODipine (NORVASC) 10 MG tablet Take 1 tablet (10 mg total) by mouth  daily. 05/03/21   Yes Ladell Pier, MD  atorvastatin (LIPITOR) 80 MG tablet Take 1 tablet (80 mg total) by mouth daily at 6 PM. Patient taking differently: Take 80 mg by mouth at bedtime. 10/31/17   Yes Lorretta Harp, MD  BAQSIMI ONE PACK 3 MG/DOSE POWD Place 1 spray into the nose as needed (Low blood sugar). 03/14/22   Yes [provider]  Carboxymethylcellulose Sodium (EYE DROPS OP) Place 1 drop into both eyes daily.     Yes [provider]  carvedilol (COREG) 6.25 MG tablet Take 1 tablet (6.25 mg total) by mouth 2 (two) times daily with a meal. Patient taking differently: Take 6.25 mg by mouth daily. 05/03/21   Yes Ladell Pier, MD  clopidogrel  (PLAVIX) 75 MG tablet TAKE 1 TABLET BY MOUTH DAILY WITH BREAKFAST. Patient taking differently: Take 75 mg by mouth daily. 05/19/21   Yes Lorretta Harp, MD  gabapentin (NEURONTIN) 600 MG tablet Take 1 tablet (600 mg total) by mouth 2 (two) times daily. 05/03/21   Yes Ladell Pier, MD  hydrALAZINE (APRESOLINE) 100 MG tablet Take 1 tablet (100 mg total) by mouth 3 (three) times daily. 05/03/21   Yes Ladell Pier, MD  hydrochlorothiazide (HYDRODIURIL) 25 MG tablet Take 1 tablet (25 mg total) by mouth daily. 05/03/21   Yes Ladell Pier, MD  insulin aspart (NOVOLOG) 100 UNIT/ML FlexPen Inject 10-17 units into the skin 3 times daily before meals. Patient taking differently: Inject 10 Units into the skin 3 (three) times daily with meals. 09/09/21   Yes    lisinopril (ZESTRIL) 10 MG tablet Take 1 tablet (10 mg total) by mouth daily. 09/09/21   Yes    Multiple Vitamins-Minerals (MULTIVITAMIN ADULT) CHEW Chew 2 tablets by mouth daily.     Yes [provider]  TOUJEO SOLOSTAR 300 UNIT/ML Solostar Pen Inject 80 Units into the skin nightly. 09/09/21   Yes    VITAMIN D PO Take 2 tablets by mouth at bedtime.     Yes [provider]  ciclopirox (PENLAC) 8 % solution Apply topically at bedtime. Apply over nail and surrounding skin. Apply daily over previous coat. After seven (7) days, may remove with alcohol and continue cycle. Patient not taking: Reported on 03/30/2022 05/03/21     Ladell Pier, MD  ciprofloxacin (CIPRO) 500 MG tablet Take 1 tablet (500 mg total) by mouth 2 (two) times daily. One po bid x 7 days Patient not taking: Reported on 03/30/2022 03/01/22     Drenda Freeze, MD  Continuous Blood Gluc Receiver (FREESTYLE LIBRE 2 READER) DEVI Scan as needed for continuous glucose monitoring. 09/09/21        Continuous Blood Gluc Receiver (FREESTYLE LIBRE 2 READER) DEVI Use as directed 3 times daily 05/26/21     Ladell Pier, MD  Continuous Blood Gluc Sensor  (FREESTYLE LIBRE 2 SENSOR) MISC Inject 1 sensor to the skin every 14 days for continuous glucose monitoring. 09/09/21        Continuous Blood Gluc Sensor (FREESTYLE LIBRE 2 SENSOR) MISC Change sensor every 2 weeks. 05/26/21     Ladell Pier, MD  gabapentin (NEURONTIN) 100 MG capsule To take in addition to your prescribed '600mg'$  twice daily Patient not taking: Reported on 03/30/2022 02/26/22     Gareth Morgan, MD  glucose blood (ONETOUCH VERIO) test strip 1 each by Misc.(Non-Drug; Combo Route) route 4 times daily before meals and nightly. 09/09/21  Lancets (ONETOUCH DELICA PLUS [id number]) MISC Use 4 times daily before meals and nightly. 09/09/21        lisinopril (ZESTRIL) 5 MG tablet Take 1 tablet (5 mg total) by mouth daily. Patient not taking: Reported on 03/30/2022 05/04/21     Ladell Pier, MD  meloxicam (MOBIC) 7.5 MG tablet Take 1 tablet (7.5 mg total) by mouth daily. Patient not taking: Reported on 03/30/2022 06/06/20     Edrick Kins, DPM  metFORMIN (GLUCOPHAGE) 1000 MG tablet Take 1 tablet (1,000 mg total) by mouth 2 (two) times daily with a meal. Patient not taking: Reported on 03/30/2022 05/03/21     Ladell Pier, MD  metoCLOPramide (REGLAN) 5 MG tablet Take 1 tablet (5 mg total) by mouth 2 (two) times daily as needed for nausea. 05/03/21     Ladell Pier, MD  TRUEPLUS PEN NEEDLES 31G X 5 MM MISC See admin instructions. 06/11/18     [provider]      Social History         Socioeconomic History   Marital status: Married      Spouse name: Not on file   Number of children: Not on file   Years of education: Not on file   Highest education level: Not on file  Occupational History   Not on file  Tobacco Use   Smoking status: Never   Smokeless tobacco: Never  Vaping Use   Vaping Use: Never used  Substance and Sexual Activity   Alcohol use: No   Drug use: No   Sexual activity: Never  Other Topics Concern   Not on file  Social History  Narrative   Not on file    Social Determinants of Health    Financial Resource Strain: Not on file  Food Insecurity: Not on file  Transportation Needs: Not on file  Physical Activity: Not on file  Stress: Not on file  Social Connections: Not on file  Intimate Partner Violence: Not on file             Family History  Problem Relation Age of Onset   Diabetes Father     Hypertension Father     Kidney disease Mother     Hypertension Mother     Cancer Maternal Grandmother     Hypertension Maternal Grandfather     Diabetes Paternal Aunt     Diabetes Paternal Uncle     Diabetes Paternal Grandfather     Colon cancer Neg Hx     Breast cancer Neg Hx        ROS: '[x]'$  Positive   '[ ]'$  Negative   '[ ]'$  All sytems reviewed and are negative   Cardiovascular: '[]'$  chest pain/pressure '[]'$  palpitations '[]'$  SOB lying flat '[]'$  DOE '[]'$  pain in legs while walking '[]'$  pain in legs at rest '[]'$  pain in legs at night '[]'$  non-healing ulcers '[]'$  hx of DVT '[]'$  swelling in legs   Pulmonary: '[]'$  productive cough '[]'$  asthma/wheezing '[]'$  home O2   Neurologic: '[]'$  weakness in '[]'$  arms '[]'$  legs '[]'$  numbness in '[]'$  arms '[]'$  legs '[]'$  hx of CVA '[]'$  mini stroke '[]'$ difficulty speaking or slurred speech '[]'$  temporary loss of vision in one eye '[]'$  dizziness   Hematologic: '[]'$  hx of cancer '[]'$  bleeding problems '[]'$  problems with blood clotting easily   Endocrine:   '[]'$  diabetes '[]'$  thyroid disease   GI '[]'$  vomiting blood '[]'$  blood in stool   GU: '[]'$  CKD/renal failure '[]'$   HD--'[]'$  M/W/F or '[]'$  T/T/S '[]'$  burning with urination '[]'$  blood in urine   Psychiatric: '[]'$  anxiety '[]'$  depression   Musculoskeletal: '[]'$  arthritis '[]'$  joint pain   Integumentary: '[]'$  rashes '[]'$  ulcers   Constitutional: '[]'$  fever '[]'$  chills     Physical Examination       Vitals:    03/31/22 0858 03/31/22 1650  BP: (!) 144/55 (!) 153/55  Pulse: 61 66  Resp: 18 18  Temp: 97.8 F (36.6 C) 99 F (37.2 C)  SpO2: 96% 97%    Body mass index is  29.99 kg/m.   General:  NAD Gait: Not observed HENT: WNL, normocephalic Pulmonary: normal non-labored breathing Cardiac: regular, without  Murmurs, rubs or gallops;  Abdomen:  soft, NT/ND Vascular Exam/Pulses: Bilateral femoral pulses palpable Left AT palpable Right DP palpable Extremities: Ischemic changes of the left fifth toe Musculoskeletal: no muscle wasting or atrophy         Neurologic: A&O X 3; Appropriate Affect ; SENSATION: normal; MOTOR FUNCTION:  moving all extremities equally. Speech is fluent/normal       CBC Labs (Brief)          Component Value Date/Time    WBC 6.7 03/31/2022 0225    RBC 4.28 03/31/2022 0225    HGB 11.9 (L) 03/31/2022 0225    HGB 13.1 05/03/2021 1130    HCT 38.4 03/31/2022 0225    HCT 40.3 05/03/2021 1130    PLT 151 03/31/2022 0225    PLT 235 05/03/2021 1130    MCV 89.7 03/31/2022 0225    MCV 87 05/03/2021 1130    MCH 27.8 03/31/2022 0225    MCHC 31.0 03/31/2022 0225    RDW 14.0 03/31/2022 0225    RDW 12.5 05/03/2021 1130    LYMPHSABS 1.5 03/31/2022 0225    LYMPHSABS 1.9 08/28/2018 1424    MONOABS 0.6 03/31/2022 0225    EOSABS 0.3 03/31/2022 0225    EOSABS 0.2 08/28/2018 1424    BASOSABS 0.0 03/31/2022 0225    BASOSABS 0.0 08/28/2018 1424        BMET Labs (Brief)          Component Value Date/Time    NA 133 (L) 03/31/2022 0225    NA 144 05/03/2021 1130    K 3.9 03/31/2022 0225    CL 98 03/31/2022 0225    CO2 22 03/31/2022 0225    GLUCOSE 125 (H) 03/31/2022 0225    BUN 27 (H) 03/31/2022 0225    BUN 11 05/03/2021 1130    CREATININE 1.05 (H) 03/31/2022 0225    CREATININE 0.88 09/29/2015 1712    CALCIUM 8.4 (L) 03/31/2022 0225    GFRNONAA 59 (L) 03/31/2022 0225    GFRNONAA >89 01/06/2015 1621    GFRAA 73 12/01/2019 1119    GFRAA >89 01/06/2015 1621        COAGS: Recent Labs       Lab Results  Component Value Date    INR 1.1 09/05/2017    INR 1.05 12/11/2014    INR 1.04 12/06/2012          Non-Invasive  Vascular Imaging:     Left lower extremity arterial duplex 02/14/2022 shows a high-grade mid left AT stenosis with velocity 372     ASSESSMENT/PLAN: [name] with hypertension diabetes and hyperlipidemia that presents with osteomyelitis of her left fifth toe.  She has previously undergone left AT intervention by Dr. Gwenlyn Found where this was recanalized in 2018.  Duplex  from 02/14/2022 shows a high-grade mid AT stenosis with a velocity of 372 and this is her only runoff vessel.  I do think she would benefit from a left lower extremity arteriogram with possible intervention.  She is a patient of Dr. Gwenlyn Found and apparently he is out of town and has asked vascular to evaluate the patient per podiatry.  I have offered her the option of lower extremity arteriogram next week with our service versus waiting on Dr. Gwenlyn Found.  She has ultimately decided on angiogram with me today and was discharged home from the hospital.   Marty Heck, MD [facility] Office: [place]

## 2022-04-06 NOTE — Op Note (Addendum)
Patient name: Katherine [name] MRN: [id number] DOB: 1956-05-11 Sex: female  04/06/2022 Pre-operative Diagnosis: Left fifth toe osteomyelitis with known peripheral arterial disease Post-operative diagnosis:  Same Surgeon:  Marty Heck, MD Procedure Performed: 1.  Ultrasound-guided access right common femoral artery 2.  Aortogram with catheter selection of aorta 3.  Left lower extremity arteriogram with selection of third order branches 4.  Left anterior tibial artery angioplasty (3 mm x 100 mm Sterling) 5.  Mynx closure of the right common femoral artery 6.  57 minutes of monitored moderate conscious sedation time  Indications: Patient is a 66 year old female admitted with left fifth toe osteomyelitis.  As part of her evaluation she was noted to have a drop in her ABIs and has a known high-grade stenosis in her anterior tibial which is her dominant runoff and dampened monophasic flow distally.  She has previously been under the care of Dr. Gwenlyn Found and has undergone previous left AT intervention.  She presents today for lower extremity arteriogram and possible invention after risks benefits discussed.  Findings:   Aortogram showed patent renal arteries bilaterally with no flow-limiting stenosis in the aortoiliac segment.  The left common femoral, profunda, SFA, popliteal arteries are all widely patent.  She has posterior tibial artery occlusion.  Dominant runoff is through the anterior tibial and peroneal.  The peroneal is patent but diseased distally at the ankle and it splits into two branches.  The anterior tibial had two moderate stenosis >60% in the midportion of the vessel.  Ultimately the anterior tibial artery stenosis was crossed antegrade and angioplastied with a 3 mm Sterling.  No significant residual stenosis.  Preserved runoff and optimized from a vascular surgery standpoint.   Procedure:  The patient was identified in the holding area and taken to room 8.  The patient was then  placed supine on the table and prepped and draped in the usual sterile fashion.  A time out was called.  Patient received Versed and fentanyl for moderate sedation.  Patient was monitored throughout sedation including her vital signs with respiratory rate, heart rate, blood pressure and oxygenation.  I was present for all sedation.  Ultrasound was used to evaluate the right common femoral artery.  It was patent .  A digital ultrasound image was acquired.  A micropuncture needle was used to access the right common femoral artery under ultrasound guidance.  An 018 wire was advanced without resistance and a micropuncture sheath was placed.  The 018 wire was removed and a benson wire was placed.  The micropuncture sheath was exchanged for a 5 french sheath.  An omniflush catheter was advanced over the wire to the level of L-1.  An abdominal angiogram was obtained.  Next, using the omniflush catheter and a benson wire, the aortic bifurcation was crossed and the catheter was placed into the the left external iliac artery and left runoff was obtained.  After evaluating images ,elected to intervene on the left anterior tibial artery stenosis in the midportion of the vessel as documented above.  I used a Rosen wire that had been placed over the aortic bifurcation and exchanged for a long 5 [place] Ansell sheath in the right groin.  Patient was given 100 units/kg IV heparin.  I then used a 018 zilient with a quick cross catheter and got down the left SFA popliteal into the anterior tibial and crossed the stenosis antegrade.  We then got dedicated imaging to identify the location of the lesions and then  treated this with a 3 mm x 100 mm Sterling to nominal pressure for 2 minutes.  Excellent results.  Preserved runoff at completion.  Ultimately we exchanged back for a short 5 French sheath in the right groin after the wires and catheters were removed.  Mynx closure was deployed.  Plan: Continue Plavix statin.  We will arrange  follow-up in 1 month with arterial duplex.  Optimized for toe amputation.   Marty Heck, MD [facility] Office: [phone]

## 2022-04-07 ENCOUNTER — Other Ambulatory Visit: Payer: Self-pay

## 2022-04-09 NOTE — Progress Notes (Signed)
Subjective:  Patient ID: Katherine [name], female    DOB: 13-Nov-1956,  MRN: [id number]  Chief Complaint  Patient presents with   Diabetic Ulcer    Dressing change left foot    66 y.o. female presents with the above complaint. History confirmed with patient.  She is here for an urgent follow-up on a left foot infection that developed over the weekend she was seen in the emergency room yesterday.  They gave her doses of IV antibiotics and prescribed her Bactrim she says she has not started taking this.  She has not looked at her foot yet, she says she has not looked at her foot since she lost her great toe a few years ago because she cannot stand the sight of it.  Says it is painful to put pressure on.  Denies nausea fever chills vomiting  Interval History: Since last visit she was hospitalized for worsening infection.  She underwent MRI which showed osteomyelitis.  Amputation and surgical intervention was recommended but she does not want to proceed with this currently.  She has an upcoming procedure with Dr. Carlis Abbott to address her poor blood flow and follow-up with infectious disease next week.  Objective:  Physical Exam: warm, good capillary refill, normal DP and PT pulses, and she has an abnormal sensory exam with loss of protective sensation and diffuse neuropathy, there is a lulceration full-thickness that probes to the level of tendon and capsule, no exposed bone, no cellulitis or purulence now.  It measures approximately 1.8x1.0x0.8 cm.     Culture taken in ER yesterday preliminary report shows gram-positive cocci   Radiographs: Multiple views x-ray of the left foot: I reviewed her radiographs from the ER yesterday there is no significant erosions or sign of a gas-forming infection  IMPRESSION: 1. Postsurgical changes of amputation of the first ray to the level of the distal shaft of the first metatarsal. There is mild marrow edema within the distal aspect of the postsurgical bone  which may be postsurgical and bland, however it is difficult to exclude the presence of infection. There is edema and swelling within the soft tissues just distal to the amputation site compatible with recent postoperative change. Recommend clinical correlation with the timing of this amputation. 2. There is soft tissue ulceration lateral to the fifth toe phalanges, and a sinus tract that extends from the plantar lateral skin surface of the fifth toe towards the fifth metatarsophalangeal joint. Findings of osteomyelitis within the proximal phalanx greater than metatarsal head of fifth toe with marrow edema extending throughout the distal 50% of the fifth metatarsal.     Electronically Signed   By: Yvonne Kendall M.D.   On: 03/30/2022 08:27 Assessment:   1. Diabetic ulcer of left midfoot associated with diabetes mellitus due to underlying condition, with necrosis of bone ([place])      Plan:  Patient was evaluated and treated and all questions answered.  Ulcer left foot -We discussed the etiology and factors that are a part of the wound healing process.  We also discussed the risk of infection both soft tissue and osteomyelitis from open ulceration.  Discussed the risk of limb loss if this happens or worsens. -Discussed with her that I would recommend partial ray amputation to treat the osteomyelitis.  She would not like to proceed with this yet.  I will discuss this further after her vascular procedure. -Debridement as below. -Dressed with Iodosorb, DSD. -Continue home dressing changes  3 times weekly with mupirocin -Continue off-loading  with surgical shoe and peg assist device.  -She has vascular intervention upcoming with Dr. Carlis Abbott -HgbA1c: 10.0% she has significant uncontrolled diabetes I discussed with her the impact of this on wound healing and risk of limb loss with this. -Last antibiotics: Continue Augmentin and doxycycline she has follow-up next week with infectious  disease -Imaging: MRI reviewed  Procedure: Excisional Debridement of Wound Rationale: Removal of non-viable soft tissue from the wound to promote healing.  Anesthesia: none Post-Debridement Wound Measurements: 1.8cm x 1.0 cm x 0.8 cm  Type of Debridement: Sharp Excisional Tissue Removed: Non-viable soft tissue Depth of Debridement: subcutaneous tissue. Technique: Sharp excisional debridement to bleeding, viable wound base.  Dressing: Dry, sterile, compression dressing. Disposition: Patient tolerated procedure well.         Return in about 2 weeks (around 04/18/2022) for wound care.

## 2022-04-10 ENCOUNTER — Encounter: Payer: Self-pay | Admitting: Infectious Diseases

## 2022-04-10 ENCOUNTER — Other Ambulatory Visit: Payer: Self-pay

## 2022-04-10 ENCOUNTER — Ambulatory Visit (INDEPENDENT_AMBULATORY_CARE_PROVIDER_SITE_OTHER): Payer: Medicare Other | Admitting: Infectious Diseases

## 2022-04-10 ENCOUNTER — Telehealth: Payer: Self-pay | Admitting: *Deleted

## 2022-04-10 VITALS — BP 189/75 | HR 64 | Temp 97.8°F

## 2022-04-10 DIAGNOSIS — M86172 Other acute osteomyelitis, left ankle and foot: Secondary | ICD-10-CM

## 2022-04-10 DIAGNOSIS — Z5181 Encounter for therapeutic drug level monitoring: Secondary | ICD-10-CM

## 2022-04-10 DIAGNOSIS — E08621 Diabetes mellitus due to underlying condition with foot ulcer: Secondary | ICD-10-CM

## 2022-04-10 DIAGNOSIS — E11621 Type 2 diabetes mellitus with foot ulcer: Secondary | ICD-10-CM | POA: Insufficient documentation

## 2022-04-10 DIAGNOSIS — L97529 Non-pressure chronic ulcer of other part of left foot with unspecified severity: Secondary | ICD-10-CM | POA: Diagnosis not present

## 2022-04-10 MED ORDER — DOXYCYCLINE MONOHYDRATE 100 MG PO TABS: 100.0000 mg | ORAL_TABLET | Freq: Two times a day (BID) | ORAL | 0 refills | Status: DC

## 2022-04-10 MED ORDER — AMOXICILLIN-POT CLAVULANATE 875-125 MG PO TABS: 1.0000 | ORAL_TABLET | Freq: Two times a day (BID) | ORAL | 0 refills | Status: DC

## 2022-04-10 MED ORDER — DOXYCYCLINE MONOHYDRATE 100 MG PO TABS: 100.0000 mg | ORAL_TABLET | Freq: Two times a day (BID) | ORAL | 0 refills | Status: AC

## 2022-04-10 MED ORDER — AMOXICILLIN-POT CLAVULANATE 875-125 MG PO TABS: 1.0000 | ORAL_TABLET | Freq: Two times a day (BID) | ORAL | 0 refills | Status: AC

## 2022-04-10 NOTE — Progress Notes (Incomplete)
Patient Active Problem List   Diagnosis Date Noted   Cellulitis of left lower extremity    Acute osteomyelitis of left foot ([place]) 03/29/2022   Neuropathy 12/08/2021   Diabetic gastroparesis associated with type 2 diabetes mellitus ([place]) 07/30/2021   Type 2 diabetes mellitus with diabetic peripheral angiopathy without gangrene, with long-term current use of insulin ([place]) 07/30/2021   Amputation of left great toe ([place]) 06/13/2021   Microalbuminuria due to type 2 diabetes mellitus ([place]) 05/04/2021   Diabetes ([place]) 12/22/2019   Renal artery stenosis ([place]) 12/01/2019   Diabetic polyneuropathy associated with type 2 diabetes mellitus ([place]) 05/14/2018   Thyroid nodule 10/04/2017   Microcytic anemia 09/11/2017   Critical lower limb ischemia ([place]) 09/10/2017   Retinopathy 08/22/2017   Thyroid enlargement 07/12/2017   Dyslipidemia 09/20/2016   DEPRESSION 09/10/2009   LEG CRAMPS 05/19/2009   GERD 02/15/2009   Gastroparesis 09/03/2008   DENTAL CARIES 07/14/2008   Essential hypertension 09/16/2003    Patient's Medications  New Prescriptions   No medications on file  Previous Medications   ACETAMINOPHEN (TYLENOL) 325 MG TABLET    Take 2 tablets (650 mg total) by mouth every 6 (six) hours as needed for mild pain (or Fever >/= 101).   AMLODIPINE (NORVASC) 10 MG TABLET    Take 1 tablet (10 mg total) by mouth daily.   AMOXICILLIN-CLAVULANATE (AUGMENTIN) 875-125 MG TABLET    Take 1 tablet by mouth 2 (two) times daily for 28 days.   ASPIRIN EC 81 MG TABLET    Take 1 tablet (81 mg total) by mouth daily. Swallow whole.   ATORVASTATIN (LIPITOR) 80 MG TABLET    Take 1 tablet (80 mg total) by mouth daily at 6 PM.   BAQSIMI ONE PACK 3 MG/DOSE POWD    Place 1 spray into the nose as needed (Low blood sugar).   CARBOXYMETHYLCELLULOSE SODIUM (EYE DROPS OP)    Place 1 drop into both eyes daily.   CARVEDILOL (COREG) 6.25 MG TABLET    Take 1 tablet (6.25 mg total) by mouth 2 (two) times daily with a  meal.   CLOPIDOGREL (PLAVIX) 75 MG TABLET    TAKE 1 TABLET BY MOUTH DAILY WITH BREAKFAST.   CONTINUOUS BLOOD GLUC RECEIVER (FREESTYLE LIBRE 2 READER) DEVI    Use as directed 3 times daily   CONTINUOUS BLOOD GLUC RECEIVER (FREESTYLE LIBRE 2 READER) DEVI    Scan as needed for continuous glucose monitoring.   CONTINUOUS BLOOD GLUC SENSOR (FREESTYLE LIBRE 2 SENSOR) MISC    Change sensor every 2 weeks.   CONTINUOUS BLOOD GLUC SENSOR (FREESTYLE LIBRE 2 SENSOR) MISC    Inject 1 sensor to the skin every 14 days for continuous glucose monitoring.   DOXYCYCLINE (ADOXA) 100 MG TABLET    Take 1 tablet (100 mg total) by mouth 2 (two) times daily for 28 days.   GABAPENTIN (NEURONTIN) 600 MG TABLET    Take 1 tablet (600 mg total) by mouth 2 (two) times daily.   GLUCOSE BLOOD (ONETOUCH VERIO) TEST STRIP    1 each by Misc.(Non-Drug; Combo Route) route 4 times daily before meals and nightly.   HYDRALAZINE (APRESOLINE) 100 MG TABLET    Take 1 tablet (100 mg total) by mouth 3 (three) times daily.   HYDROCHLOROTHIAZIDE (HYDRODIURIL) 25 MG TABLET    Take 1 tablet (25 mg total) by mouth daily.   INSULIN ASPART (NOVOLOG) 100 UNIT/ML FLEXPEN    Inject 10-17 units into the skin  3 times daily before meals.   LANCETS (ONETOUCH DELICA PLUS ASTMHD62I) MISC    Use 4 times daily before meals and nightly.   LISINOPRIL (ZESTRIL) 10 MG TABLET    Take 1 tablet (10 mg total) by mouth daily.   MULTIPLE VITAMINS-MINERALS (MULTIVITAMIN ADULT) CHEW    Chew 2 tablets by mouth daily.   MUPIROCIN CREAM (BACTROBAN) 2 %    Apply topically daily. Apply to left foot wound and cover with dry dressing once daily   OXYCODONE-ACETAMINOPHEN (PERCOCET/ROXICET) 5-325 MG TABLET    Take 1 tablet by mouth every 6 (six) hours as needed for moderate pain.   TOUJEO SOLOSTAR 300 UNIT/ML SOLOSTAR PEN    Inject 80 Units into the skin nightly.   TRUEPLUS PEN NEEDLES 31G X 5 MM MISC    See admin instructions.   VITAMIN D PO    Take 2 tablets by mouth at bedtime.   Modified Medications   No medications on file  Discontinued Medications   No medications on file    Subjective: After last seen on 5/12, patient was discharged on 5/13 on po doxycyline and po augmentin as patient refused surgical intervention. Seen by Vascular surgery 5/18 and had angiogram done with Left anterior tibial artery angioplasty. She also saw Podiatry on 5/16 and had excisional debridement of the wound done. They plan to follow her up after her vascular intervention done. She was not ready for partial ray amputation per discussion on that visit. She is taking doxycycline and augmentin twice a day without issues or missing doses. Has some stomach upset and nausea but denies any diarrhea or vomiting. She closed eyes during our conversation and tells me she has not looked at her left foot after it got ampuatated 1st time and she is not ready for another amputation. She says she is also not interested in following up with Podiatry anymore as she tells me they don't have staff for wound care stuff. Discussed with her abtx are not going to heal her foot infection without source control to which she was upset and was hopeful if prolonged abtx course would cure the infection.   Review of Systems: ROS all systems reviewed with pertinent positives and negatives as listed above  Past Medical History:  Diagnosis Date   Anemia    requiring transfusion   Boil of buttock ~ 2016   Depression    Diabetic neuropathy ([place])    Archie Endo 08/23/2017   DKA (diabetic ketoacidoses)    recent/notes 01/06/2015   Hyperlipidemia    Hypertension    Neuropathy    Peripheral vascular disease ([place])    Toe ulcer ([place])    left great toe/notes 08/23/2017   Type II diabetes mellitus ([place])    Past Surgical History:  Procedure Laterality Date   ABDOMINAL AORTOGRAM W/LOWER EXTREMITY N/A 04/06/2022   Procedure: ABDOMINAL AORTOGRAM W/LOWER EXTREMITY;  Surgeon: Marty Heck, MD;  Location: [facility];   Service: Cardiovascular;  Laterality: N/A;   AMPUTATION TOE Left 10/26/2017   Procedure: AMPUTATION PARTIAL RAY LEFT FOOT AND IRRIGATION/DEBRIDEMENT;  Surgeon: Edrick Kins, DPM;  Location: [facility];  Service: Podiatry;  Laterality: Left;  AMPUTATION PARTIAL RAY LEFT FOOT AND IRRIGATION/DEBRIDEMENT   CHOLECYSTECTOMY  01/24/2003   Archie Endo 04/04/2011   COLONOSCOPY     INCISION AND DRAINAGE ABSCESS  03/17/2012   "right but; left lower abdoment"/notes 03/17/2012   LOWER EXTREMITY INTERVENTION N/A 09/10/2017   Procedure: LOWER EXTREMITY INTERVENTION;  Surgeon: Lorretta Harp,  MD;  Location: [facility];  Service: Cardiovascular;  Laterality: N/A;   PERIPHERAL VASCULAR ATHERECTOMY  09/10/2017   Procedure: PERIPHERAL VASCULAR ATHERECTOMY;  Surgeon: Lorretta Harp, MD;  Location: [facility];  Service: Cardiovascular;;  left AT   PERIPHERAL VASCULAR BALLOON ANGIOPLASTY Left 04/06/2022   Procedure: PERIPHERAL VASCULAR BALLOON ANGIOPLASTY;  Surgeon: Marty Heck, MD;  Location: [facility];  Service: Cardiovascular;  Laterality: Left;     Social History   Tobacco Use   Smoking status: Never   Smokeless tobacco: Never  Vaping Use   Vaping Use: Never used  Substance Use Topics   Alcohol use: No   Drug use: No    Family History  Problem Relation Age of Onset   Diabetes Father    Hypertension Father    Kidney disease Mother    Hypertension Mother    Cancer Maternal Grandmother    Hypertension Maternal Grandfather    Diabetes Paternal Aunt    Diabetes Paternal Uncle    Diabetes Paternal Grandfather    Colon cancer Neg Hx    Breast cancer Neg Hx     Allergies  Allergen Reactions   Other Swelling    Seaweed= swelling on arms, hands and face   Metformin And Related Nausea Only    Health Maintenance  Topic Date Due   Zoster Vaccines- Shingrix (1 of 2) Never done   COVID-19 Vaccine (2 - Moderna series) 04/05/2020   PAP SMEAR-Modifier  12/17/2020    COLONOSCOPY (Pts 45-42yr Insurance coverage will need to be confirmed)  12/11/2021   FOOT EXAM  05/03/2022   OPHTHALMOLOGY EXAM  06/17/2022   INFLUENZA VACCINE  06/20/2022   HEMOGLOBIN A1C  09/29/2022   MAMMOGRAM  06/30/2023   TETANUS/TDAP  04/09/2030   Pneumonia Vaccine 66 Years old  Completed   DEXA SCAN  Completed   Hepatitis C Screening  Completed   HIV Screening  Completed   HPV VACCINES  Aged Out    Objective: BP (!) 189/75   Pulse 64   Temp 97.8 F (36.6 C) (Temporal)    Physical Exam Constitutional:      Appearance: Normal appearance.  HENT:     Head: Normocephalic and atraumatic.      Mouth: Mucous membranes are moist.  Eyes:    Conjunctiva/sclera: Conjunctivae normal.     Pupils:   Cardiovascular:     Rate and Rhythm: Normal rate and regular rhythm.     Heart sounds:   Pulmonary:     Effort: Pulmonary effort is normal.     Breath sounds: Normal breath sounds.   Abdominal:     General: Non distended     Palpations: soft.   Musculoskeletal:        General: Normal range of motion.   Left Foot     Skin:    General: Skin is warm and dry.     Comments:  Neurological:     General: grossly non focal     Mental Status: awake, alert and oriented to person, place, and time.   Psychiatric:        Mood and Affect: Mood normal.   Lab Results Lab Results  Component Value Date   WBC 7.2 04/01/2022   HGB 12.2 04/06/2022   HCT 36.0 04/06/2022   MCV 86.6 04/01/2022   PLT 276 04/01/2022    Lab Results  Component Value Date   CREATININE 0.60 04/06/2022   BUN 14 04/06/2022  NA 139 04/06/2022   K 3.5 04/06/2022   CL 104 04/06/2022   CO2 27 04/01/2022    Lab Results  Component Value Date   ALT 10 03/30/2022   AST 10 (L) 03/30/2022   ALKPHOS 80 03/30/2022   BILITOT 0.9 03/30/2022    Lab Results  Component Value Date   CHOL 175 02/23/2021   HDL 56 02/23/2021   LDLCALC 100 (H) 02/23/2021   TRIG 106 02/23/2021   CHOLHDL 3.1 02/23/2021    Lab Results  Component Value Date   LABRPR NON REAC 01/30/2011   No results found for: HIV1RNAQUANT, HIV1RNAVL, CD4TABS    ABI 04/01/22 Summary: Right: Resting right ankle-brachial index is within normal range. No evidence of significant right lower extremity arterial disease. The right toe-brachial index is abnormal. Left: Although ankle brachial indices show only mild left lower extremity arterial disease, arterial Doppler waveforms at the ankle suggest some component of more moderate arterial occlusive disease. Left great toe has been amputated. *See table(s) above for measurements and observations.  Problem List Items Addressed This Visit       Endocrine   Diabetic ulcer of left foot associated with type 2 diabetes mellitus ([place])     Musculoskeletal and Integument   Acute osteomyelitis of left foot ([place]) - Primary     Other   Medication monitoring encounter   Relevant Orders   Comprehensive metabolic panel   CBC   C-reactive protein   Sedimentation rate   Assessment/Plan # Left lateral 5th toe DFU/Osteomyelitis  -H/o  s/p left partial 1st ray amputation   -Continue Doxycycline and augmentin to complete 6 weeks course. She is aware that high chances of recurrence with antibiotics by itself without surgical intervention for source control. Do not think IV abtx are  going to be of much benefit  She is not inclined for amputation on conversation today as well as per dicsussion with podiatry last visit  Fu in 2 weeks   # PAD  - s/p vascular intervention, follows [facility]  # Poorly controlled DM with Neuropathy - BG control   # Medication Monitoring  -Labs today   I have personally spent 45 minutes involved in face-to-face and non-face-to-face activities for this patient on the day of the visit. Professional time spent includes the following activities: Preparing to see the patient (review of tests), Obtaining and/or reviewing separately obtained history  (admission/discharge record), Performing a medically appropriate examination and/or evaluation , Ordering medications/tests/procedures, referring and communicating with other health care professionals, Documenting clinical information in the EMR, Independently interpreting results (not separately reported), Communicating results to the patient/family/caregiver, Counseling and educating the patient/family/caregiver and Care coordination (not separately reported).   Wilber Oliphant, [place] [facility] 04/10/2022, 11:05 AM

## 2022-04-10 NOTE — Telephone Encounter (Signed)
Patient is calling to request home health referral, is interested,please advise.

## 2022-04-11 ENCOUNTER — Encounter: Payer: Self-pay | Admitting: Podiatry

## 2022-04-11 ENCOUNTER — Ambulatory Visit (INDEPENDENT_AMBULATORY_CARE_PROVIDER_SITE_OTHER): Payer: Medicare Other | Admitting: Podiatry

## 2022-04-11 DIAGNOSIS — L97424 Non-pressure chronic ulcer of left heel and midfoot with necrosis of bone: Secondary | ICD-10-CM

## 2022-04-11 DIAGNOSIS — E08621 Diabetes mellitus due to underlying condition with foot ulcer: Secondary | ICD-10-CM

## 2022-04-11 NOTE — Progress Notes (Signed)
Subjective:  Patient ID: Katherine [name], female    DOB: 12/04/1955,  MRN: [id number]  Chief Complaint  Patient presents with   Diabetic Ulcer    Left midfoot    66 y.o. female presents with the above complaint. History confirmed with patient.  She is here for an urgent follow-up on a left foot infection that developed over the weekend she was seen in the emergency room yesterday.  They gave her doses of IV antibiotics and prescribed her Bactrim she says she has not started taking this.  She has not looked at her foot yet, she says she has not looked at her foot since she lost her great toe a few years ago because she cannot stand the sight of it.  Says it is painful to put pressure on.  Denies nausea fever chills vomiting  Interval History: Says she has changed the dressing now.  Says it is doing somewhat better.  She did undergo her vascular procedure which seems to be a success.  She is still taking other 6 weeks of antibiotics and saw infectious disease yesterday.  Objective:  Physical Exam: warm, good capillary refill, normal DP and PT pulses, and she has an abnormal sensory exam with loss of protective sensation and diffuse neuropathy, there is a lulceration full-thickness that probes to the level of tendon and capsule, no exposed bone, no cellulitis or purulence now.  It measures approximately 1.8x1.1x0.6 cm.      Culture taken in ER yesterday preliminary report shows gram-positive cocci   Radiographs: Multiple views x-ray of the left foot: I reviewed her radiographs from the ER yesterday there is no significant erosions or sign of a gas-forming infection  IMPRESSION: 1. Postsurgical changes of amputation of the first ray to the level of the distal shaft of the first metatarsal. There is mild marrow edema within the distal aspect of the postsurgical bone which may be postsurgical and bland, however it is difficult to exclude the presence of infection. There is edema and swelling  within the soft tissues just distal to the amputation site compatible with recent postoperative change. Recommend clinical correlation with the timing of this amputation. 2. There is soft tissue ulceration lateral to the fifth toe phalanges, and a sinus tract that extends from the plantar lateral skin surface of the fifth toe towards the fifth metatarsophalangeal joint. Findings of osteomyelitis within the proximal phalanx greater than metatarsal head of fifth toe with marrow edema extending throughout the distal 50% of the fifth metatarsal.     Electronically Signed   By: Yvonne Kendall M.D.   On: 03/30/2022 08:27 Assessment:   1. Diabetic ulcer of left midfoot associated with diabetes mellitus due to underlying condition, with necrosis of bone ([place])      Plan:  Patient was evaluated and treated and all questions answered.  Ulcer left foot -We discussed the etiology and factors that are a part of the wound healing process.  We also discussed the risk of infection both soft tissue and osteomyelitis from open ulceration.  Discussed the risk of limb loss if this happens or worsens. -Discussed with her that I would recommend partial ray amputation to treat the osteomyelitis.  She would not like to proceed with this yet.  I will discuss this further after her vascular procedure. -Debridement as below. -Dressed with Iodosorb, DSD. -Continue home dressing changes  3 times weekly with mupirocin -Continue off-loading with surgical shoe and peg assist device.  -Has undergone successful vascular intervention with  Dr. Carlis Abbott -HgbA1c: 10.0% she has significant uncontrolled diabetes I discussed with her the impact of this on wound healing and risk of limb loss with this. -Last antibiotics: Will be on Augmentin and doxycycline for another 6 weeks -Imaging: MRI reviewed  Procedure: Excisional Debridement of Wound Rationale: Removal of non-viable soft tissue from the wound to promote healing.   Anesthesia: none Post-Debridement Wound Measurements: 1.8cm x 1.1 cm x 0.6 cm Type of Debridement: Sharp Excisional Tissue Removed: Non-viable soft tissue Depth of Debridement: subcutaneous tissue. Technique: Sharp excisional debridement to bleeding, viable wound base.  Dressing: Dry, sterile, compression dressing. Disposition: Patient tolerated procedure well.         Return in about 2 days (around 04/13/2022) for wound care.

## 2022-04-11 NOTE — Telephone Encounter (Signed)
Faxed referral for home health to [facility] ([facility]), received confimation 04/11/22,patient has been notified.

## 2022-04-11 NOTE — Telephone Encounter (Signed)
Called and spoke with patient letting her know that home health order has been submitted, verbalized understanding.

## 2022-04-13 ENCOUNTER — Ambulatory Visit (INDEPENDENT_AMBULATORY_CARE_PROVIDER_SITE_OTHER): Payer: Medicare Other | Admitting: Podiatry

## 2022-04-13 DIAGNOSIS — E08621 Diabetes mellitus due to underlying condition with foot ulcer: Secondary | ICD-10-CM | POA: Diagnosis not present

## 2022-04-13 DIAGNOSIS — L97424 Non-pressure chronic ulcer of left heel and midfoot with necrosis of bone: Secondary | ICD-10-CM | POA: Diagnosis not present

## 2022-04-13 NOTE — Progress Notes (Signed)
Subjective:  Patient ID: Katherine [name], female    DOB: [birth year],  MRN: [id number]  Chief Complaint  Patient presents with   Diabetic Ulcer     wound care    66 y.o. female presents with the above complaint. History confirmed with patient.  She is here for an urgent follow-up on a left foot infection that developed over the weekend she was seen in the emergency room yesterday.  They gave her doses of IV antibiotics and prescribed her Bactrim she says she has not started taking this.  She has not looked at her foot yet, she says she has not looked at her foot since she lost her great toe a few years ago because she cannot stand the sight of it.  Says it is painful to put pressure on.  Denies nausea fever chills vomiting  Interval History: Still taking antibiotics.  No change in wound.  Objective:  Physical Exam: warm, good capillary refill, normal DP and PT pulses, and she has an abnormal sensory exam with loss of protective sensation and diffuse neuropathy, there is a lulceration full-thickness that probes to the level of tendon and capsule, no exposed bone, no cellulitis or purulence now.  It measures approximately 1.7x1.2x0.6 cm.      Culture taken in ER yesterday preliminary report shows gram-positive cocci   Radiographs: Multiple views x-ray of the left foot: I reviewed her radiographs from the ER yesterday there is no significant erosions or sign of a gas-forming infection  IMPRESSION: 1. Postsurgical changes of amputation of the first ray to the level of the distal shaft of the first metatarsal. There is mild marrow edema within the distal aspect of the postsurgical bone which may be postsurgical and bland, however it is difficult to exclude the presence of infection. There is edema and swelling within the soft tissues just distal to the amputation site compatible with recent postoperative change. Recommend clinical correlation with the timing of this amputation. 2. There  is soft tissue ulceration lateral to the fifth toe phalanges, and a sinus tract that extends from the plantar lateral skin surface of the fifth toe towards the fifth metatarsophalangeal joint. Findings of osteomyelitis within the proximal phalanx greater than metatarsal head of fifth toe with marrow edema extending throughout the distal 50% of the fifth metatarsal.     Electronically Signed   By: Yvonne Kendall M.D.   On: 03/30/2022 08:27 Assessment:   1. Diabetic ulcer of left midfoot associated with diabetes mellitus due to underlying condition, with necrosis of bone ([place])      Plan:  Patient was evaluated and treated and all questions answered.  Ulcer left foot -We discussed the etiology and factors that are a part of the wound healing process.  We also discussed the risk of infection both soft tissue and osteomyelitis from open ulceration.  Discussed the risk of limb loss if this happens or worsens. -Discussed with her that I would recommend partial ray amputation to treat the osteomyelitis.  She would not like to proceed with this yet.  I will discuss this further after her vascular procedure. -Debridement as below. -Dressed with Iodosorb, DSD. -Continue home dressing changes  3 times weekly with mupirocin -Continue off-loading with surgical shoe and peg assist device.  -Has undergone successful vascular intervention with Dr. Carlis Abbott -HgbA1c: 10.0% she has significant uncontrolled diabetes I discussed with her the impact of this on wound healing and risk of limb loss with this. -Last antibiotics: Will be on Augmentin  and doxycycline for another 6 weeks -Imaging: MRI reviewed  Procedure: Excisional Debridement of Wound Rationale: Removal of non-viable soft tissue from the wound to promote healing.  Anesthesia: none Post-Debridement Wound Measurements: 1.6cm x 1.2 cm x 0.6 cm Type of Debridement: Sharp Excisional Tissue Removed: Non-viable soft tissue Depth of Debridement:  subcutaneous tissue. Technique: Sharp excisional debridement to bleeding, viable wound base.  Dressing: Dry, sterile, compression dressing. Disposition: Patient tolerated procedure well.         Return for follow up as scheduled on 04/18/22.

## 2022-04-13 NOTE — Patient Instructions (Signed)
[place] [phone]

## 2022-04-14 ENCOUNTER — Telehealth: Payer: Self-pay | Admitting: *Deleted

## 2022-04-14 NOTE — Telephone Encounter (Signed)
[facility] is unable to accept patient for home health,are currently slammed and mainly due to patient's insurance. Patient is aware.

## 2022-04-18 ENCOUNTER — Ambulatory Visit: Payer: Medicare Other | Admitting: Podiatry

## 2022-04-18 DIAGNOSIS — E1140 Type 2 diabetes mellitus with diabetic neuropathy, unspecified: Secondary | ICD-10-CM | POA: Diagnosis not present

## 2022-04-20 ENCOUNTER — Ambulatory Visit (INDEPENDENT_AMBULATORY_CARE_PROVIDER_SITE_OTHER): Payer: Medicare Other | Admitting: Podiatry

## 2022-04-20 ENCOUNTER — Telehealth: Payer: Self-pay | Admitting: Internal Medicine

## 2022-04-20 DIAGNOSIS — E08621 Diabetes mellitus due to underlying condition with foot ulcer: Secondary | ICD-10-CM

## 2022-04-20 DIAGNOSIS — L97424 Non-pressure chronic ulcer of left heel and midfoot with necrosis of bone: Secondary | ICD-10-CM

## 2022-04-20 NOTE — Progress Notes (Signed)
Patient seen by RN today for dressing change. Will see again on Monday and then will see me in 1 week for debridement / eval

## 2022-04-20 NOTE — Telephone Encounter (Signed)
Copied from [place] [id number]. Topic: Referral - Request for Referral >> Apr 20, 2022  4:21 PM Yvette Rack wrote: Has patient seen PCP for this complaint? Yes.   *If NO, is insurance requiring patient see PCP for this issue before PCP can refer them? Referral for which specialty: [facility] Preferred provider/office: No specific location Reason for referral: Pt requests referral for home health nurse for open wound care

## 2022-04-24 ENCOUNTER — Encounter: Payer: Self-pay | Admitting: Infectious Diseases

## 2022-04-24 ENCOUNTER — Other Ambulatory Visit: Payer: Medicare Other

## 2022-04-24 ENCOUNTER — Other Ambulatory Visit: Payer: Self-pay

## 2022-04-24 ENCOUNTER — Ambulatory Visit (INDEPENDENT_AMBULATORY_CARE_PROVIDER_SITE_OTHER): Payer: Medicare Other | Admitting: Infectious Diseases

## 2022-04-24 VITALS — BP 167/75 | HR 59 | Temp 98.2°F

## 2022-04-24 DIAGNOSIS — Z5181 Encounter for therapeutic drug level monitoring: Secondary | ICD-10-CM

## 2022-04-24 DIAGNOSIS — M86172 Other acute osteomyelitis, left ankle and foot: Secondary | ICD-10-CM

## 2022-04-24 NOTE — Progress Notes (Signed)
Patient Active Problem List   Diagnosis Date Noted   Diabetic ulcer of left foot associated with type 2 diabetes mellitus ([place]) 04/10/2022   Medication monitoring encounter 04/10/2022   Cellulitis of left lower extremity    Acute osteomyelitis of left foot ([place]) 03/29/2022   Neuropathy 12/08/2021   Diabetic gastroparesis associated with type 2 diabetes mellitus ([place]) 07/30/2021   Type 2 diabetes mellitus with diabetic peripheral angiopathy without gangrene, with long-term current use of insulin ([place]) 07/30/2021   Amputation of left great toe ([employer]) 06/13/2021   Microalbuminuria due to type 2 diabetes mellitus ([place]) 05/04/2021   Diabetes ([place]) 12/22/2019   Renal artery stenosis ([place]) 12/01/2019   Diabetic polyneuropathy associated with type 2 diabetes mellitus ([place]) 05/14/2018   Thyroid nodule 10/04/2017   Microcytic anemia 09/11/2017   Critical lower limb ischemia ([place]) 09/10/2017   Retinopathy 08/22/2017   Thyroid enlargement 07/12/2017   Dyslipidemia 09/20/2016   DEPRESSION 09/10/2009   LEG CRAMPS 05/19/2009   GERD 02/15/2009   Gastroparesis 09/03/2008   DENTAL CARIES 07/14/2008   Essential hypertension 09/16/2003   Current Outpatient Medications on File Prior to Visit  Medication Sig Dispense Refill   amLODipine (NORVASC) 10 MG tablet Take 1 tablet (10 mg total) by mouth daily. 30 tablet 6   amoxicillin-clavulanate (AUGMENTIN) 875-125 MG tablet Take 1 tablet by mouth 2 (two) times daily for 14 days. 28 tablet 0   aspirin EC 81 MG tablet Take 1 tablet (81 mg total) by mouth daily. Swallow whole. 150 tablet 2   atorvastatin (LIPITOR) 80 MG tablet Take 1 tablet (80 mg total) by mouth daily at 6 PM. (Patient taking differently: Take 80 mg by mouth at bedtime.) 30 tablet 6   BAQSIMI ONE PACK 3 MG/DOSE POWD Place 1 spray into the nose as needed (Low blood sugar).     Carboxymethylcellulose Sodium (EYE DROPS OP) Place 1 drop into both eyes daily.     carvedilol (COREG) 6.25 MG  tablet Take 1 tablet (6.25 mg total) by mouth 2 (two) times daily with a meal. (Patient taking differently: Take 6.25 mg by mouth daily.) 60 tablet 6   clopidogrel (PLAVIX) 75 MG tablet TAKE 1 TABLET BY MOUTH DAILY WITH BREAKFAST. (Patient taking differently: Take 75 mg by mouth daily.) 90 tablet 2   Continuous Blood Gluc Receiver (FREESTYLE LIBRE 2 READER) DEVI Use as directed 3 times daily 1 each 12   doxycycline (ADOXA) 100 MG tablet Take 1 tablet (100 mg total) by mouth 2 (two) times daily for 14 days. 28 tablet 0   gabapentin (NEURONTIN) 600 MG tablet Take 1 tablet (600 mg total) by mouth 2 (two) times daily. 60 tablet 6   hydrALAZINE (APRESOLINE) 100 MG tablet Take 1 tablet (100 mg total) by mouth 3 (three) times daily. 90 tablet 6   hydrochlorothiazide (HYDRODIURIL) 25 MG tablet Take 1 tablet (25 mg total) by mouth daily. 30 tablet 6   insulin aspart (NOVOLOG) 100 UNIT/ML FlexPen Inject 10-17 units into the skin 3 times daily before meals. (Patient taking differently: Inject 10 Units into the skin 3 (three) times daily with meals.) 45 mL 3   Lancets (ONETOUCH DELICA PLUS [id number]) MISC Use 4 times daily before meals and nightly. 400 each 3   lisinopril (ZESTRIL) 10 MG tablet Take 1 tablet (10 mg total) by mouth daily. 90 tablet 3   Multiple Vitamins-Minerals (MULTIVITAMIN ADULT) CHEW Chew 2 tablets by mouth daily.     mupirocin cream (BACTROBAN)  2 % Apply topically daily. Apply to left foot wound and cover with dry dressing once daily 30 g 0   TOUJEO SOLOSTAR 300 UNIT/ML Solostar Pen Inject 80 Units into the skin nightly. 30 mL 3   VITAMIN D PO Take 2 tablets by mouth at bedtime.     Continuous Blood Gluc Receiver (FREESTYLE LIBRE 2 READER) DEVI Scan as needed for continuous glucose monitoring. 1 each 0   Continuous Blood Gluc Sensor (FREESTYLE LIBRE 2 SENSOR) MISC Change sensor every 2 weeks. 2 each 12   Continuous Blood Gluc Sensor (FREESTYLE LIBRE 2 SENSOR) MISC Inject 1 sensor to the skin  every 14 days for continuous glucose monitoring. 2 each 11   glucose blood (ONETOUCH VERIO) test strip 1 each by Misc.(Non-Drug; Combo Route) route 4 times daily before meals and nightly. 400 each 3   TRUEPLUS PEN NEEDLES 31G X 5 MM MISC See admin instructions.  11   No current facility-administered medications on file prior to visit.     Subjective: After last seen on 5/12, patient was discharged on 5/13 on po doxycyline and po augmentin as patient refused surgical intervention. Seen by Vascular surgery 5/18 and had angiogram done with Left anterior tibial artery angioplasty. She also saw Podiatry on 5/16 and had excisional debridement of the wound done. They plan to follow her up after her vascular intervention done. She was not ready for partial ray amputation per discussion on that visit. She is taking doxycycline and augmentin twice a day without issues or missing doses. Has some stomach upset and nausea but denies any diarrhea or vomiting. She closed eyes during our conversation and tells me she has not looked at her left foot after it got ampuatated 1st time and she is not ready for another amputation. She says she is also not interested in following up with Podiatry anymore as she tells me they don't have staff for wound care stuff. Discussed with her abtx are not going to heal her foot infection without source control to which she was upset and was hopeful if prolonged abtx course would cure the infection.   04/24/22 Taking doxycycline and augmentin as prescribed. Denies missing doses. Feels fatigued which she thinks is related to drinking a lot of water while taking pills. Denies nausea, vomiting, abdominal pain and diarrhea. Denies fevers, chills and sweats. Minimal drainage from the lateral wound. She is closley following with Podiatry. Last seen on 6/1 and had excisional debridement of the wound done.   Review of Systems: ROS all systems reviewed with pertinent positives and negatives as  listed above  Past Medical History:  Diagnosis Date   Anemia    requiring transfusion   Boil of buttock ~ 2016   Depression    Diabetic neuropathy ([place])    Archie Endo 08/23/2017   DKA (diabetic ketoacidoses)    recent/notes 01/06/2015   Hyperlipidemia    Hypertension    Neuropathy    Peripheral vascular disease ([place])    Toe ulcer ([place])    left great toe/notes 08/23/2017   Type II diabetes mellitus ([place])    Past Surgical History:  Procedure Laterality Date   ABDOMINAL AORTOGRAM W/LOWER EXTREMITY N/A 04/06/2022   Procedure: ABDOMINAL AORTOGRAM W/LOWER EXTREMITY;  Surgeon: Marty Heck, MD;  Location: [facility];  Service: Cardiovascular;  Laterality: N/A;   AMPUTATION TOE Left 10/26/2017   Procedure: AMPUTATION PARTIAL RAY LEFT FOOT AND IRRIGATION/DEBRIDEMENT;  Surgeon: Edrick Kins, DPM;  Location: [facility];  Service:  Podiatry;  Laterality: Left;  AMPUTATION PARTIAL RAY LEFT FOOT AND IRRIGATION/DEBRIDEMENT   CHOLECYSTECTOMY  01/24/2003   Archie Endo 04/04/2011   COLONOSCOPY     INCISION AND DRAINAGE ABSCESS  03/17/2012   "right but; left lower abdoment"/notes 03/17/2012   LOWER EXTREMITY INTERVENTION N/A 09/10/2017   Procedure: LOWER EXTREMITY INTERVENTION;  Surgeon: Lorretta Harp, MD;  Location: [facility];  Service: Cardiovascular;  Laterality: N/A;   PERIPHERAL VASCULAR ATHERECTOMY  09/10/2017   Procedure: PERIPHERAL VASCULAR ATHERECTOMY;  Surgeon: Lorretta Harp, MD;  Location: [facility];  Service: Cardiovascular;;  left AT   PERIPHERAL VASCULAR BALLOON ANGIOPLASTY Left 04/06/2022   Procedure: PERIPHERAL VASCULAR BALLOON ANGIOPLASTY;  Surgeon: Marty Heck, MD;  Location: [facility];  Service: Cardiovascular;  Laterality: Left;     Social History   Tobacco Use   Smoking status: Never   Smokeless tobacco: Never  Vaping Use   Vaping Use: Never used  Substance Use Topics   Alcohol use: No   Drug use: No    Family History   Problem Relation Age of Onset   Diabetes Father    Hypertension Father    Kidney disease Mother    Hypertension Mother    Cancer Maternal Grandmother    Hypertension Maternal Grandfather    Diabetes Paternal Aunt    Diabetes Paternal Uncle    Diabetes Paternal Grandfather    Colon cancer Neg Hx    Breast cancer Neg Hx     Allergies  Allergen Reactions   Other Swelling    Seaweed= swelling on arms, hands and face   Metformin And Related Nausea Only    Health Maintenance  Topic Date Due   Zoster Vaccines- Shingrix (1 of 2) Never done   COVID-19 Vaccine (2 - Moderna series) 04/05/2020   PAP SMEAR-Modifier  12/17/2020   COLONOSCOPY (Pts 45-22yr Insurance coverage will need to be confirmed)  12/11/2021   FOOT EXAM  05/03/2022   OPHTHALMOLOGY EXAM  06/17/2022   INFLUENZA VACCINE  06/20/2022   HEMOGLOBIN A1C  09/29/2022   MAMMOGRAM  06/30/2023   TETANUS/TDAP  04/09/2030   Pneumonia Vaccine 66 Years old  Completed   DEXA SCAN  Completed   Hepatitis C Screening  Completed   HIV Screening  Completed   HPV VACCINES  Aged Out    Objective: BP (!) 167/75   Pulse (!) 59   Temp 98.2 F (36.8 C) (Oral)   SpO2 98%    Physical Exam Constitutional:      Appearance: Normal appearance.  HENT:     Head: Normocephalic and atraumatic.      Mouth: Mucous membranes are moist.  Eyes:    Conjunctiva/sclera: Conjunctivae normal.     Pupils:   Cardiovascular:     Rate and Rhythm: Normal rate and regular rhythm.     Heart sounds:   Pulmonary:     Effort: Pulmonary effort is normal.     Breath sounds: Normal breath sounds.   Abdominal:     General: Non distended     Palpations: soft.   Musculoskeletal:        General: Normal range of motion.   Left Foot        Skin:    General: Skin is warm and dry.     Comments:  Neurological:     General: grossly non focal     Mental Status: awake, alert and oriented to person, place, and time.   Psychiatric:  Mood and Affect: Mood normal.   Lab Results Lab Results  Component Value Date   WBC 7.2 04/01/2022   HGB 12.2 04/06/2022   HCT 36.0 04/06/2022   MCV 86.6 04/01/2022   PLT 276 04/01/2022    Lab Results  Component Value Date   CREATININE 0.60 04/06/2022   BUN 14 04/06/2022   NA 139 04/06/2022   K 3.5 04/06/2022   CL 104 04/06/2022   CO2 27 04/01/2022    Lab Results  Component Value Date   ALT 10 03/30/2022   AST 10 (L) 03/30/2022   ALKPHOS 80 03/30/2022   BILITOT 0.9 03/30/2022    Lab Results  Component Value Date   CHOL 175 02/23/2021   HDL 56 02/23/2021   LDLCALC 100 (H) 02/23/2021   TRIG 106 02/23/2021   CHOLHDL 3.1 02/23/2021   Lab Results  Component Value Date   LABRPR NON REAC 01/30/2011   No results found for: HIV1RNAQUANT, HIV1RNAVL, CD4TABS    ABI 04/01/22 Summary: Right: Resting right ankle-brachial index is within normal range. No evidence of significant right lower extremity arterial disease. The right toe-brachial index is abnormal. Left: Although ankle brachial indices show only mild left lower extremity arterial disease, arterial Doppler waveforms at the ankle suggest some component of more moderate arterial occlusive disease. Left great toe has been amputated. *See table(s) above for measurements and observations.  Problem List Items Addressed This Visit       Musculoskeletal and Integument   Acute osteomyelitis of left foot (HCC)     Other   Medication monitoring encounter - Primary   Relevant Orders   Comprehensive metabolic panel   CBC (Completed)   C-reactive protein   Sedimentation rate   Assessment/Plan # Left lateral 5th toe DFU/Osteomyelitis  -H/o  s/p left partial 1st ray amputation   -Continue Doxycycline and augmentin to complete 6 weeks course.  -Fu with Podiatry as instructed  -Fu with me in 2 weeks  # PAD  - s/p vascular intervention, follows Vascular  # Poorly controlled DM with Neuropathy - BG control   #  Medication Monitoring  -Labs today   I have personally spent 32 minutes involved in face-to-face and non-face-to-face activities for this patient on the day of the visit including counseling of the patient and coordination of care.   Wilber Oliphant, [place] [facility] 04/24/2022, 11:30 AM

## 2022-04-25 ENCOUNTER — Telehealth: Payer: Self-pay

## 2022-04-25 LAB — COMPREHENSIVE METABOLIC PANEL
AG Ratio: 1.1 (calc) (ref 1.0–2.5)
ALT: 8 U/L (ref 6–29)
AST: 10 U/L (ref 10–35)
Albumin: 3.7 g/dL (ref 3.6–5.1)
Alkaline phosphatase (APISO): 117 U/L (ref 37–153)
BUN: 18 mg/dL (ref 7–25)
CO2: 33 mmol/L — ABNORMAL HIGH (ref 20–32)
Calcium: 9.4 mg/dL (ref 8.6–10.4)
Chloride: 102 mmol/L (ref 98–110)
Creat: 0.95 mg/dL (ref 0.50–1.05)
Globulin: 3.3 g/dL (calc) (ref 1.9–3.7)
Glucose, Bld: 220 mg/dL — ABNORMAL HIGH (ref 65–99)
Potassium: 3.8 mmol/L (ref 3.5–5.3)
Sodium: 142 mmol/L (ref 135–146)
Total Bilirubin: 0.6 mg/dL (ref 0.2–1.2)
Total Protein: 7 g/dL (ref 6.1–8.1)

## 2022-04-25 LAB — CBC
HCT: 37.3 % (ref 35.0–45.0)
Hemoglobin: 12 g/dL (ref 11.7–15.5)
MCH: 28 pg (ref 27.0–33.0)
MCHC: 32.2 g/dL (ref 32.0–36.0)
MCV: 86.9 fL (ref 80.0–100.0)
MPV: 11.9 fL (ref 7.5–12.5)
Platelets: 211 10*3/uL (ref 140–400)
RBC: 4.29 10*6/uL (ref 3.80–5.10)
RDW: 14 % (ref 11.0–15.0)
WBC: 4.6 10*3/uL (ref 3.8–10.8)

## 2022-04-25 LAB — SEDIMENTATION RATE: Sed Rate: 11 mm/h (ref 0–30)

## 2022-04-25 LAB — C-REACTIVE PROTEIN: CRP: 1.5 mg/L (ref <8.0)

## 2022-04-25 NOTE — Telephone Encounter (Signed)
Patient returned call, relayed that labs were "ok" per Dr. West Bali. Patient verbalized understanding and has no further questions.   Beryle Flock, RN

## 2022-04-25 NOTE — Telephone Encounter (Signed)
Pls advise if OV needed first before [facility] order.

## 2022-04-25 NOTE — Telephone Encounter (Signed)
Called patient to relay results, no answer and voicemail box not set up.  Beryle Flock, RN

## 2022-04-25 NOTE — Telephone Encounter (Signed)
-----   Message from Rosiland Oz, MD sent at 04/25/2022 10:16 AM EDT ----- Please let her know labs look OK.

## 2022-04-27 ENCOUNTER — Ambulatory Visit (INDEPENDENT_AMBULATORY_CARE_PROVIDER_SITE_OTHER): Payer: Medicare Other | Admitting: Podiatry

## 2022-04-27 DIAGNOSIS — E08621 Diabetes mellitus due to underlying condition with foot ulcer: Secondary | ICD-10-CM | POA: Diagnosis not present

## 2022-04-27 DIAGNOSIS — L97424 Non-pressure chronic ulcer of left heel and midfoot with necrosis of bone: Secondary | ICD-10-CM

## 2022-04-27 NOTE — Telephone Encounter (Signed)
I called the patient and she stated she has been following up with wound care for dressing changes but that is only 1-2 x/week and her dressing needs to be changed every day.  She is having difficulty performing the wound care herself because of her loss of dexterity with her hands.   I explained to her that she will need to be seen by Dr Wynetta Emery for a face to face encounter prior to placing a home health referral. She has an appointment with Dr Wynetta Emery -05/15/2022 and nothing is available for her to be see sooner.  She only wants to see Dr Wynetta Emery, not another provider.   I also explained to her that even if a referral is made for home health services, there is no guarantee that a home health agency will accept the referral, the agency needs to be in network with her insurance and have staffing available.  If an agency is able to accept the referral, they would not be doing daily wound care, she would still need to do it most days of the week.   She said she understood and she understands she needs to continue to do the wound care as she has been instructed

## 2022-05-01 ENCOUNTER — Encounter: Payer: Self-pay | Admitting: Podiatry

## 2022-05-01 NOTE — Progress Notes (Signed)
Subjective:  Patient ID: Katherine [name], female    DOB: 31-Dec-1955,  MRN: [id number]  Chief Complaint  Patient presents with   Diabetic Ulcer    Diabetic ulcer left foot   Follow-up    Dressing change.    66 y.o. female presents with the above complaint. History confirmed with patient.  She is here for an urgent follow-up on a left foot infection that developed over the weekend she was seen in the emergency room yesterday.  They gave her doses of IV antibiotics and prescribed her Bactrim she says she has not started taking this.  She has not looked at her foot yet, she says she has not looked at her foot since she lost her great toe a few years ago because she cannot stand the sight of it.  Says it is painful to put pressure on.  Denies nausea fever chills vomiting  Interval History: Doing well she says she has been changing the dressing  Objective:  Physical Exam: warm, good capillary refill, normal DP and PT pulses, and she has an abnormal sensory exam with loss of protective sensation and diffuse neuropathy, there is a ulceration full-thickness that probes to the level of tendon and capsule, no exposed bone, no cellulitis or purulence now.  It measures approximately 1.1 x 0.3 x 0.3 cm.       Culture taken in ER yesterday preliminary report shows gram-positive cocci   Radiographs: Multiple views x-ray of the left foot: I reviewed her radiographs from the ER yesterday there is no significant erosions or sign of a gas-forming infection  IMPRESSION: 1. Postsurgical changes of amputation of the first ray to the level of the distal shaft of the first metatarsal. There is mild marrow edema within the distal aspect of the postsurgical bone which may be postsurgical and bland, however it is difficult to exclude the presence of infection. There is edema and swelling within the soft tissues just distal to the amputation site compatible with recent postoperative change. Recommend clinical  correlation with the timing of this amputation. 2. There is soft tissue ulceration lateral to the fifth toe phalanges, and a sinus tract that extends from the plantar lateral skin surface of the fifth toe towards the fifth metatarsophalangeal joint. Findings of osteomyelitis within the proximal phalanx greater than metatarsal head of fifth toe with marrow edema extending throughout the distal 50% of the fifth metatarsal.     Electronically Signed   By: Yvonne Kendall M.D.   On: 03/30/2022 08:27 Assessment:   1. Diabetic ulcer of left midfoot associated with diabetes mellitus due to underlying condition, with necrosis of bone ([place])      Plan:  Patient was evaluated and treated and all questions answered.  Ulcer left foot -We discussed the etiology and factors that are a part of the wound healing process.  We also discussed the risk of infection both soft tissue and osteomyelitis from open ulceration.  Discussed the risk of limb loss if this happens or worsens. -Show significant improvement this week.  We will continue local wound care.-Debridement as below. -Dressed with Iodosorb, DSD. -Continue home dressing changes  3 times weekly with mupirocin -Continue off-loading with surgical shoe and peg assist device.  -Has undergone successful vascular intervention with Dr. Carlis Abbott -HgbA1c: 10.0% she has significant uncontrolled diabetes I discussed with her the impact of this on wound healing and risk of limb loss with this. -Last antibiotics: Will be on Augmentin and doxycycline for another 6 weeks -Imaging:  MRI reviewed  Procedure: Excisional Debridement of Wound Rationale: Removal of non-viable soft tissue from the wound to promote healing.  Anesthesia: none Post-Debridement Wound Measurements: 1.1x0.3 cm x 0.3 cm Type of Debridement: Sharp Excisional Tissue Removed: Non-viable soft tissue Depth of Debridement: subcutaneous tissue. Technique: Sharp excisional debridement to bleeding,  viable wound base.  Dressing: Dry, sterile, compression dressing. Disposition: Patient tolerated procedure well.         Return in about 2 weeks (around 05/11/2022) for wound care.

## 2022-05-02 DIAGNOSIS — Z79891 Long term (current) use of opiate analgesic: Secondary | ICD-10-CM | POA: Diagnosis not present

## 2022-05-02 DIAGNOSIS — M79671 Pain in right foot: Secondary | ICD-10-CM | POA: Diagnosis not present

## 2022-05-04 ENCOUNTER — Ambulatory Visit (INDEPENDENT_AMBULATORY_CARE_PROVIDER_SITE_OTHER): Payer: Medicare Other

## 2022-05-04 DIAGNOSIS — L97424 Non-pressure chronic ulcer of left heel and midfoot with necrosis of bone: Secondary | ICD-10-CM

## 2022-05-04 DIAGNOSIS — E08621 Diabetes mellitus due to underlying condition with foot ulcer: Secondary | ICD-10-CM

## 2022-05-04 NOTE — Progress Notes (Signed)
Patient in office for dressing change today. Patient denies nausea, vomiting, fever and chills. Left foot cleansed and foot dressed with iodosorb, DSD. Encouraged patient to continue home dressing changes 3 times weekly with mupirocin. New peg assitst device was dispensed to continue off-loading with surgical shoe. All questions answered today.

## 2022-05-05 ENCOUNTER — Other Ambulatory Visit: Payer: Medicare Other

## 2022-05-09 ENCOUNTER — Other Ambulatory Visit: Payer: Self-pay | Admitting: *Deleted

## 2022-05-09 DIAGNOSIS — M86172 Other acute osteomyelitis, left ankle and foot: Secondary | ICD-10-CM

## 2022-05-09 DIAGNOSIS — I739 Peripheral vascular disease, unspecified: Secondary | ICD-10-CM

## 2022-05-15 ENCOUNTER — Encounter: Payer: Self-pay | Admitting: Internal Medicine

## 2022-05-15 ENCOUNTER — Ambulatory Visit: Payer: Medicare Other | Attending: Internal Medicine | Admitting: Internal Medicine

## 2022-05-15 VITALS — BP 105/65 | HR 63 | Resp 16 | Wt 212.0 lb

## 2022-05-15 DIAGNOSIS — I152 Hypertension secondary to endocrine disorders: Secondary | ICD-10-CM

## 2022-05-15 DIAGNOSIS — E1159 Type 2 diabetes mellitus with other circulatory complications: Secondary | ICD-10-CM | POA: Diagnosis not present

## 2022-05-15 DIAGNOSIS — M868X7 Other osteomyelitis, ankle and foot: Secondary | ICD-10-CM | POA: Diagnosis not present

## 2022-05-15 DIAGNOSIS — S98132A Complete traumatic amputation of one left lesser toe, initial encounter: Secondary | ICD-10-CM

## 2022-05-15 DIAGNOSIS — L97529 Non-pressure chronic ulcer of other part of left foot with unspecified severity: Secondary | ICD-10-CM | POA: Diagnosis not present

## 2022-05-15 DIAGNOSIS — I739 Peripheral vascular disease, unspecified: Secondary | ICD-10-CM

## 2022-05-15 DIAGNOSIS — Z23 Encounter for immunization: Secondary | ICD-10-CM

## 2022-05-15 DIAGNOSIS — E11621 Type 2 diabetes mellitus with foot ulcer: Secondary | ICD-10-CM

## 2022-05-15 DIAGNOSIS — Z1211 Encounter for screening for malignant neoplasm of colon: Secondary | ICD-10-CM

## 2022-05-16 ENCOUNTER — Ambulatory Visit (INDEPENDENT_AMBULATORY_CARE_PROVIDER_SITE_OTHER): Payer: Medicare Other | Admitting: Physician Assistant

## 2022-05-16 ENCOUNTER — Ambulatory Visit (INDEPENDENT_AMBULATORY_CARE_PROVIDER_SITE_OTHER)
Admission: RE | Admit: 2022-05-16 | Discharge: 2022-05-16 | Disposition: A | Discharge status: Home / Self Care | Payer: Medicare Other | Source: Ambulatory Visit | Attending: Vascular Surgery | Admitting: Vascular Surgery

## 2022-05-16 ENCOUNTER — Ambulatory Visit (HOSPITAL_COMMUNITY)
Admission: RE | Admit: 2022-05-16 | Discharge: 2022-05-16 | Disposition: A | Discharge status: Home / Self Care | Payer: Medicare Other | Source: Ambulatory Visit | Attending: Vascular Surgery | Admitting: Vascular Surgery

## 2022-05-16 VITALS — BP 125/68 | HR 62 | Temp 97.7°F | Resp 18 | Ht 72.0 in | Wt 211.8 lb

## 2022-05-16 DIAGNOSIS — I739 Peripheral vascular disease, unspecified: Secondary | ICD-10-CM

## 2022-05-16 DIAGNOSIS — M86172 Other acute osteomyelitis, left ankle and foot: Secondary | ICD-10-CM

## 2022-05-16 MED ORDER — MUPIROCIN CALCIUM 2 % EX CREA: TOPICAL_CREAM | Freq: Every day | CUTANEOUS | 0 refills | Status: DC

## 2022-05-16 NOTE — Progress Notes (Signed)
Office Note     CC:  follow up Requesting Provider:  Marcine Matar, MD  HPI: Katherine [name] is a 66 y.o. (Feb 26, 1956) female who presents status post left lower extremity arteriogram with balloon angioplasty of the anterior tibial artery by Dr. Chestine Spore on 04/06/2022.  She then underwent an office debridement of a left fifth toe ulceration and osteomyelitis with podiatry.  She states that she returns for wound care about every 4 days.  She states the wound has been healing well.  She denies any fevers, chills, nausea/vomiting.  Surgical history also significant for great toe amputation in 2018.  She is on aspirin, statin, Plavix daily.   Past Medical History:  Diagnosis Date   Anemia    requiring transfusion   Boil of buttock ~ 2016   Depression    Diabetic neuropathy (HCC)    [name] 08/23/2017   DKA (diabetic ketoacidoses)    recent/notes 01/06/2015   Hyperlipidemia    Hypertension    Neuropathy    Peripheral vascular disease (HCC)    Toe ulcer (HCC)    left great toe/notes 08/23/2017   Type II diabetes mellitus (HCC)     Past Surgical History:  Procedure Laterality Date   ABDOMINAL AORTOGRAM W/LOWER EXTREMITY N/A 04/06/2022   Procedure: ABDOMINAL AORTOGRAM W/LOWER EXTREMITY;  Surgeon: Cephus Shelling, MD;  Location: [facility] IN[facility]AS[facility];  Service: Cardiovascular;  Laterality: N/A;   AMPUTATION TOE Left 10/26/2017   Procedure: AMPUTATION PARTIAL RAY LEFT FOOT AND IRRIGATION/DEBRIDEMENT;  Surgeon: Felecia Shelling, DPM;  Location: [facility] OR;  Service: Podiatry;  Laterality: Left;  AMPUTATION PARTIAL RAY LEFT FOOT AND IRRIGATION/DEBRIDEMENT   CHOLECYSTECTOMY  01/24/2003   [name] 04/04/2011   COLONOSCOPY     INCISION AND DRAINAGE ABSCESS  03/17/2012   "right but; left lower abdoment"/notes 03/17/2012   LOWER EXTREMITY INTERVENTION N/A 09/10/2017   Procedure: LOWER EXTREMITY INTERVENTION;  Surgeon: Runell Gess, MD;  Location: [facility];  Service: Cardiovascular;   Laterality: N/A;   PERIPHERAL VASCULAR ATHERECTOMY  09/10/2017   Procedure: PERIPHERAL VASCULAR ATHERECTOMY;  Surgeon: Runell Gess, MD;  Location: [facility] INVASIVE CV LAB;  Service: Cardiovascular;;  left AT   PERIPHERAL VASCULAR BALLOON ANGIOPLASTY Left 04/06/2022   Procedure: PERIPHERAL VASCULAR BALLOON ANGIOPLASTY;  Surgeon: Cephus Shelling, MD;  Location: [facility];  Service: Cardiovascular;  Laterality: Left;    Social History   Socioeconomic History   Marital status: Married    Spouse name: Not on file   Number of children: Not on file   Years of education: Not on file   Highest education level: Not on file  Occupational History   Not on file  Tobacco Use   Smoking status: Never   Smokeless tobacco: Never  Vaping Use   Vaping Use: Never used  Substance and Sexual Activity   Alcohol use: No   Drug use: No   Sexual activity: Never  Other Topics Concern   Not on file  Social History Narrative   Not on file   Social Determinants of Health   Financial Resource Strain: Not on file  Food Insecurity: Not on file  Transportation Needs: Not on file  Physical Activity: Not on file  Stress: Not on file  Social Connections: Not on file  Intimate Partner Violence: Not on file    Family History  Problem Relation Age of Onset   Diabetes Father    Hypertension Father    Kidney disease Mother  Hypertension Mother    Cancer Maternal Grandmother    Hypertension Maternal Grandfather    Diabetes Paternal Aunt    Diabetes Paternal Uncle    Diabetes Paternal Grandfather    Colon cancer Neg Hx    Breast cancer Neg Hx     Current Outpatient Medications  Medication Sig Dispense Refill   amLODipine (NORVASC) 10 MG tablet Take 1 tablet (10 mg total) by mouth daily. 30 tablet 6   aspirin EC 81 MG tablet Take 1 tablet (81 mg total) by mouth daily. Swallow whole. 150 tablet 2   atorvastatin (LIPITOR) 80 MG tablet Take 1 tablet (80 mg total) by mouth daily at 6 PM.  (Patient taking differently: Take 80 mg by mouth at bedtime.) 30 tablet 6   BAQSIMI ONE PACK 3 MG/DOSE POWD Place 1 spray into the nose as needed (Low blood sugar).     Carboxymethylcellulose Sodium (EYE DROPS OP) Place 1 drop into both eyes daily.     carvedilol (COREG) 6.25 MG tablet Take 1 tablet (6.25 mg total) by mouth 2 (two) times daily with a meal. (Patient taking differently: Take 6.25 mg by mouth daily.) 60 tablet 6   clopidogrel (PLAVIX) 75 MG tablet TAKE 1 TABLET BY MOUTH DAILY WITH BREAKFAST. (Patient taking differently: Take 75 mg by mouth daily.) 90 tablet 2   Continuous Blood Gluc Receiver (FREESTYLE LIBRE 2 READER) DEVI Use as directed 3 times daily 1 each 12   Continuous Blood Gluc Receiver (FREESTYLE LIBRE 2 READER) DEVI Scan as needed for continuous glucose monitoring. 1 each 0   Continuous Blood Gluc Sensor (FREESTYLE LIBRE 2 SENSOR) MISC Change sensor every 2 weeks. 2 each 12   Continuous Blood Gluc Sensor (FREESTYLE LIBRE 2 SENSOR) MISC Inject 1 sensor to the skin every 14 days for continuous glucose monitoring. 2 each 11   gabapentin (NEURONTIN) 600 MG tablet Take 1 tablet (600 mg total) by mouth 2 (two) times daily. (Patient taking differently: Take 650 mg by mouth 2 (two) times daily.) 60 tablet 6   glucose blood (ONETOUCH VERIO) test strip 1 each by Misc.(Non-Drug; Combo Route) route 4 times daily before meals and nightly. 400 each 3   hydrALAZINE (APRESOLINE) 100 MG tablet Take 1 tablet (100 mg total) by mouth 3 (three) times daily. 90 tablet 6   hydrochlorothiazide (HYDRODIURIL) 25 MG tablet Take 1 tablet (25 mg total) by mouth daily. 30 tablet 6   insulin aspart (NOVOLOG) 100 UNIT/ML FlexPen Inject 10-17 units into the skin 3 times daily before meals. (Patient taking differently: Inject 10 Units into the skin 3 (three) times daily with meals.) 45 mL 3   Lancets (ONETOUCH DELICA PLUS LANCET33G) MISC Use 4 times daily before meals and nightly. 400 each 3   lisinopril  (ZESTRIL) 10 MG tablet Take 1 tablet (10 mg total) by mouth daily. 90 tablet 3   Multiple Vitamins-Minerals (MULTIVITAMIN ADULT) CHEW Chew 2 tablets by mouth daily.     TOUJEO SOLOSTAR 300 UNIT/ML Solostar Pen Inject 80 Units into the skin nightly. 30 mL 3   TRUEPLUS PEN NEEDLES 31G X 5 MM MISC See admin instructions.  11   VITAMIN D PO Take 2 tablets by mouth at bedtime.     mupirocin cream (BACTROBAN) 2 % Apply topically daily. Apply to left foot wound and cover with dry dressing once daily 30 g 0   No current facility-administered medications for this visit.    Allergies  Allergen Reactions   Other Swelling  Seaweed= swelling on arms, hands and face   Metformin And Related Nausea Only     REVIEW OF SYSTEMS:   [X]  denotes positive finding, [ ]  denotes negative finding Cardiac  Comments:  Chest pain or chest pressure:    Shortness of breath upon exertion:    Short of breath when lying flat:    Irregular heart rhythm:        Vascular    Pain in calf, thigh, or hip brought on by ambulation:    Pain in feet at night that wakes you up from your sleep:     Blood clot in your veins:    Leg swelling:         Pulmonary    Oxygen at home:    Productive cough:     Wheezing:         Neurologic    Sudden weakness in arms or legs:     Sudden numbness in arms or legs:     Sudden onset of difficulty speaking or slurred speech:    Temporary loss of vision in one eye:     Problems with dizziness:         Gastrointestinal    Blood in stool:     Vomited blood:         Genitourinary    Burning when urinating:     Blood in urine:        Psychiatric    Major depression:         Hematologic    Bleeding problems:    Problems with blood clotting too easily:        Skin    Rashes or ulcers:        Constitutional    Fever or chills:      PHYSICAL EXAMINATION:  Vitals:   05/16/22 1355  BP: 125/68  Pulse: 62  Resp: 18  Temp: 97.7 F (36.5 C)  TempSrc: Temporal   SpO2: 99%  Weight: 211 lb 12.8 oz (96.1 kg)  Height: 6' (1.829 m)    General:  WDWN in NAD; vital signs documented above Gait: Not observed HENT: WNL, normocephalic Pulmonary: normal non-labored breathing , without Rales, rhonchi,  wheezing Cardiac: regular HR Abdomen: soft, NT, no masses Skin: without rashes Vascular Exam/Pulses:  Right Left  Radial 2+ (normal) 2+ (normal)  DP Not examined Palpable left ATA  PT Not examined absent   Extremities: Dry eschar left lateral fifth toe base with no purulence or surrounding erythema Musculoskeletal: no muscle wasting or atrophy  Neurologic: A&O X 3;  No focal weakness or paresthesias are detected Psychiatric:  The pt has Normal affect.   Non-Invasive Vascular Imaging:   Patent left ATA on duplex  ABI/TBIToday's ABIToday's TBIPrevious ABIPrevious TBI  +-------+-----------+-----------+------------+------------+  Right  1.05       0.61       1.24        0.68          +-------+-----------+-----------+------------+------------+  Left   1.09       amputation 0.91        amputation      ASSESSMENT/PLAN:: 66 y.o. female status post left lower extremity arteriogram with ATA angioplasty   -Based on arterial duplex and physical exam, left the ATA remains patent.  Left foot ulcer appears to be healing well.  Continue current wound care and follow-up per podiatry.  Patient is optimized from a vascular surgery standpoint.  Continue aspirin, Plavix, statin daily.  Repeat arterial  duplex and ABI in 6 months per protocol.  Call/return office sooner with any questions or concerns.   Emilie Rutter, PA-C [facility] [phone]  Clinic MD:   Chestine Spore

## 2022-05-17 ENCOUNTER — Ambulatory Visit (INDEPENDENT_AMBULATORY_CARE_PROVIDER_SITE_OTHER): Payer: Medicare Other

## 2022-05-17 DIAGNOSIS — E08621 Diabetes mellitus due to underlying condition with foot ulcer: Secondary | ICD-10-CM

## 2022-05-17 DIAGNOSIS — L97424 Non-pressure chronic ulcer of left heel and midfoot with necrosis of bone: Secondary | ICD-10-CM

## 2022-05-17 NOTE — Progress Notes (Signed)
Patient in office for dressing change today. Patient denies nausea, vomiting, fever and chills. Left foot cleansed and foot dressed with iodosorb, DSD. Encouraged patient to continue home dressing changes 3 times weekly with mupirocin.   Patient was wondering how long does she have to continue using the surgical shoe. Dr. Sherryle Lis clarified that he would like for her to continue using shoe until he sees her in 2 weeks for a wound check. Patient notified and agreed.  All questions answered today.

## 2022-05-19 ENCOUNTER — Telehealth: Payer: Self-pay

## 2022-05-19 ENCOUNTER — Ambulatory Visit: Payer: Medicare Other | Admitting: Infectious Diseases

## 2022-05-19 ENCOUNTER — Ambulatory Visit: Payer: Medicare Other | Admitting: Podiatry

## 2022-05-19 NOTE — Telephone Encounter (Signed)
Called patient to reschedule missed appointment. No answer. Voicemail not set up; unable to leave message. Will send MyChart message.  Binnie Kand, RN

## 2022-05-24 ENCOUNTER — Other Ambulatory Visit: Payer: Self-pay

## 2022-05-24 ENCOUNTER — Ambulatory Visit: Payer: Medicare Other | Admitting: Podiatry

## 2022-05-24 DIAGNOSIS — I739 Peripheral vascular disease, unspecified: Secondary | ICD-10-CM

## 2022-05-30 ENCOUNTER — Ambulatory Visit: Payer: Medicare Other | Admitting: Podiatry

## 2022-06-01 DIAGNOSIS — G63 Polyneuropathy in diseases classified elsewhere: Secondary | ICD-10-CM | POA: Diagnosis not present

## 2022-06-01 DIAGNOSIS — Z79891 Long term (current) use of opiate analgesic: Secondary | ICD-10-CM | POA: Diagnosis not present

## 2022-06-27 ENCOUNTER — Ambulatory Visit: Payer: Medicare Other | Admitting: Pharmacist

## 2022-07-02 ENCOUNTER — Other Ambulatory Visit: Payer: Self-pay | Admitting: Cardiovascular Disease

## 2022-07-03 ENCOUNTER — Ambulatory Visit (INDEPENDENT_AMBULATORY_CARE_PROVIDER_SITE_OTHER): Payer: Medicare Other | Admitting: Podiatry

## 2022-07-03 DIAGNOSIS — L97424 Non-pressure chronic ulcer of left heel and midfoot with necrosis of bone: Secondary | ICD-10-CM | POA: Diagnosis not present

## 2022-07-03 DIAGNOSIS — E08621 Diabetes mellitus due to underlying condition with foot ulcer: Secondary | ICD-10-CM | POA: Diagnosis not present

## 2022-07-03 DIAGNOSIS — B351 Tinea unguium: Secondary | ICD-10-CM

## 2022-07-04 NOTE — Progress Notes (Signed)
  Subjective:  Patient ID: Katherine [name], female    DOB: 05-30-1956,  MRN: [id number]  Chief Complaint  Patient presents with   Diabetes    Patient request toenail trim     66 y.o. female presents with the above complaint. History confirmed with patient.  She is here for an urgent follow-up on a left foot infection that developed over the weekend she was seen in the emergency room yesterday.  They gave her doses of IV antibiotics and prescribed her Bactrim she says she has not started taking this.  She has not looked at her foot yet, she says she has not looked at her foot since she lost her great toe a few years ago because she cannot stand the sight of it.  Says it is painful to put pressure on.  Denies nausea fever chills vomiting  Interval History: Doing well says it has fully healed  Objective:  Physical Exam: warm, good capillary refill, normal DP and PT pulses, and she has an abnormal sensory exam with loss of protective sensation and diffuse neuropathy, ulcer on lateral left foot has fully healed.  Nails are thickened elongated brown discolored with subungual debris x9    Culture taken in ER yesterday preliminary report shows gram-positive cocci   Radiographs: Multiple views x-ray of the left foot: I reviewed her radiographs from the ER yesterday there is no significant erosions or sign of a gas-forming infection  IMPRESSION: 1. Postsurgical changes of amputation of the first ray to the level of the distal shaft of the first metatarsal. There is mild marrow edema within the distal aspect of the postsurgical bone which may be postsurgical and bland, however it is difficult to exclude the presence of infection. There is edema and swelling within the soft tissues just distal to the amputation site compatible with recent postoperative change. Recommend clinical correlation with the timing of this amputation. 2. There is soft tissue ulceration lateral to the fifth toe phalanges,  and a sinus tract that extends from the plantar lateral skin surface of the fifth toe towards the fifth metatarsophalangeal joint. Findings of osteomyelitis within the proximal phalanx greater than metatarsal head of fifth toe with marrow edema extending throughout the distal 50% of the fifth metatarsal.     Electronically Signed   By: Yvonne Kendall M.D.   On: 03/30/2022 08:27 Assessment:   1. Diabetic ulcer of left midfoot associated with diabetes mellitus due to underlying condition, with necrosis of bone ([place])   2. Pain due to onychomycosis of toenail      Plan:  Patient was evaluated and treated and all questions answered.  Ulcer left foot -Doing very well her wound is fully healed at this point.  We discussed the risk of recurrence.  She may apply lotion to the previously ulcerated area.  Advised to inspect feet daily.  She will let me know if she has any issues with this.  Discussed the etiology and treatment options for the condition in detail with the patient. Educated patient on the topical and oral treatment options for mycotic nails. Recommended debridement of the nails today. Sharp and mechanical debridement performed of all painful and mycotic nails today. Nails debrided in length and thickness using a nail nipper to level of comfort. Discussed treatment options including appropriate shoe gear. Follow up as needed for painful nails.          Return if symptoms worsen or fail to improve.

## 2022-07-31 ENCOUNTER — Telehealth: Payer: Self-pay

## 2022-07-31 NOTE — Telephone Encounter (Signed)
Pt walked into office with c/o BLE swelling, pain, and discoloration. Pt filled out triage form, appt made for tomorrow with PA, no available appt for [place]. [place] studies done at last visit on 05/16/22.

## 2022-08-01 ENCOUNTER — Ambulatory Visit (INDEPENDENT_AMBULATORY_CARE_PROVIDER_SITE_OTHER): Payer: Medicare Other | Admitting: Physician Assistant

## 2022-08-01 VITALS — BP 149/61 | HR 62 | Temp 98.1°F | Resp 20 | Ht 72.0 in | Wt 216.1 lb

## 2022-08-01 DIAGNOSIS — I872 Venous insufficiency (chronic) (peripheral): Secondary | ICD-10-CM

## 2022-08-01 DIAGNOSIS — I739 Peripheral vascular disease, unspecified: Secondary | ICD-10-CM | POA: Diagnosis not present

## 2022-08-01 DIAGNOSIS — M7989 Other specified soft tissue disorders: Secondary | ICD-10-CM

## 2022-08-01 NOTE — Progress Notes (Signed)
Office Note     CC:  follow up Requesting Provider:  Ladell Pier, MD  HPI: Katherine [name] is a 66 y.o. (July 31, 1956) female who presents with concerns of swelling, pain and discoloration of her left leg. This has been present for several days. She says she has been trying to elevate her leg and this helps but as soon as she is back up on her leg the swelling comes right back. She also reports trying to use compression stockings, which she says makes her legs feel better but then she noticed after using them last that her legs were like ghost white. This scared her so she stopped wearing them. She also says she has been having stabbing and tingling in her left foot mostly with weight bearing and at times it is very painful. She takes Gabapentin but feels this is not really helping. She denies any pain on ambulation in her legs or at rest. She does not have any new wounds.  She most recently underwent left lower extremity arteriogram with balloon angioplasty of the anterior tibial artery by Dr. Carlis Abbott on 04/06/2022. Following that she underwent an office debridement of a left fifth toe ulceration with osteomyelitis by podiatry, Dr. Sherryle Lis. She has not been back to see podiatry since her wounds have healed. At her last follow up with [place] in June she had ABI of 1.05 on right and 1.09 on Left.  She has hx of great toe amputation in 2018.  The pt is on a statin for cholesterol management.  The pt is on a daily aspirin.   Other AC:  Plavix The pt is on CCB, BB, HCTZ, ACE for hypertension.   The pt is diabetic.  Tobacco hx:  never  Past Medical History:  Diagnosis Date   Anemia    requiring transfusion   Boil of buttock ~ 2016   Depression    Diabetic neuropathy ([place])    Archie Endo 08/23/2017   DKA (diabetic ketoacidoses)    recent/notes 01/06/2015   Hyperlipidemia    Hypertension    Neuropathy    Peripheral vascular disease ([place])    Toe ulcer ([place])    left great toe/notes 08/23/2017   Type  II diabetes mellitus ([place])     Past Surgical History:  Procedure Laterality Date   ABDOMINAL AORTOGRAM W/LOWER EXTREMITY N/A 04/06/2022   Procedure: ABDOMINAL AORTOGRAM W/LOWER EXTREMITY;  Surgeon: Marty Heck, MD;  Location: [place] CV LAB;  Service: Cardiovascular;  Laterality: N/A;   AMPUTATION TOE Left 10/26/2017   Procedure: AMPUTATION PARTIAL RAY LEFT FOOT AND IRRIGATION/DEBRIDEMENT;  Surgeon: Edrick Kins, DPM;  Location: [place];  Service: Podiatry;  Laterality: Left;  AMPUTATION PARTIAL RAY LEFT FOOT AND IRRIGATION/DEBRIDEMENT   CHOLECYSTECTOMY  01/24/2003   Archie Endo 04/04/2011   COLONOSCOPY     INCISION AND DRAINAGE ABSCESS  03/17/2012   "right but; left lower abdoment"/notes 03/17/2012   LOWER EXTREMITY INTERVENTION N/A 09/10/2017   Procedure: LOWER EXTREMITY INTERVENTION;  Surgeon: Lorretta Harp, MD;  Location: [place] CV LAB;  Service: Cardiovascular;  Laterality: N/A;   PERIPHERAL VASCULAR ATHERECTOMY  09/10/2017   Procedure: PERIPHERAL VASCULAR ATHERECTOMY;  Surgeon: Lorretta Harp, MD;  Location: [place] CV LAB;  Service: Cardiovascular;;  left AT   PERIPHERAL VASCULAR BALLOON ANGIOPLASTY Left 04/06/2022   Procedure: PERIPHERAL VASCULAR BALLOON ANGIOPLASTY;  Surgeon: Marty Heck, MD;  Location: [place] CV LAB;  Service: Cardiovascular;  Laterality: Left;    Social History  Socioeconomic History   Marital status: Married    Spouse name: Not on file   Number of children: Not on file   Years of education: Not on file   Highest education level: Not on file  Occupational History   Not on file  Tobacco Use   Smoking status: Never    Passive exposure: Never   Smokeless tobacco: Never  Vaping Use   Vaping Use: Never used  Substance and Sexual Activity   Alcohol use: No   Drug use: No   Sexual activity: Never  Other Topics Concern   Not on file  Social History Narrative   Not on file   Social Determinants of Health   Financial  Resource Strain: Not on file  Food Insecurity: Not on file  Transportation Needs: Not on file  Physical Activity: Not on file  Stress: Not on file  Social Connections: Not on file  Intimate Partner Violence: Not on file    Family History  Problem Relation Age of Onset   Diabetes Father    Hypertension Father    Kidney disease Mother    Hypertension Mother    Cancer Maternal Grandmother    Hypertension Maternal Grandfather    Diabetes Paternal Aunt    Diabetes Paternal Uncle    Diabetes Paternal Grandfather    Colon cancer Neg Hx    Breast cancer Neg Hx     Current Outpatient Medications  Medication Sig Dispense Refill   amLODipine (NORVASC) 10 MG tablet Take 1 tablet (10 mg total) by mouth daily. 30 tablet 6   aspirin EC 81 MG tablet Take 1 tablet (81 mg total) by mouth daily. Swallow whole. 150 tablet 2   atorvastatin (LIPITOR) 80 MG tablet Take 1 tablet (80 mg total) by mouth daily at 6 PM. (Patient taking differently: Take 80 mg by mouth at bedtime.) 30 tablet 6   BAQSIMI ONE PACK 3 MG/DOSE POWD Place 1 spray into the nose as needed (Low blood sugar).     Carboxymethylcellulose Sodium (EYE DROPS OP) Place 1 drop into both eyes daily.     carvedilol (COREG) 6.25 MG tablet Take 1 tablet (6.25 mg total) by mouth 2 (two) times daily with a meal. (Patient taking differently: Take 6.25 mg by mouth daily.) 60 tablet 6   clopidogrel (PLAVIX) 75 MG tablet TAKE 1 TABLET BY MOUTH EVERY DAY WITH BREAKFAST 90 tablet 3   Continuous Blood Gluc Receiver (FREESTYLE LIBRE 2 READER) DEVI Use as directed 3 times daily 1 each 12   Continuous Blood Gluc Receiver (FREESTYLE LIBRE 2 READER) DEVI Scan as needed for continuous glucose monitoring. 1 each 0   Continuous Blood Gluc Sensor (FREESTYLE LIBRE 2 SENSOR) MISC Change sensor every 2 weeks. 2 each 12   Continuous Blood Gluc Sensor (FREESTYLE LIBRE 2 SENSOR) MISC Inject 1 sensor to the skin every 14 days for continuous glucose monitoring. 2 each 11    gabapentin (NEURONTIN) 600 MG tablet Take 1 tablet (600 mg total) by mouth 2 (two) times daily. (Patient taking differently: Take 650 mg by mouth 2 (two) times daily.) 60 tablet 6   glucose blood (ONETOUCH VERIO) test strip 1 each by Misc.(Non-Drug; Combo Route) route 4 times daily before meals and nightly. 400 each 3   hydrALAZINE (APRESOLINE) 100 MG tablet Take 1 tablet (100 mg total) by mouth 3 (three) times daily. 90 tablet 6   hydrochlorothiazide (HYDRODIURIL) 25 MG tablet Take 1 tablet (25 mg total) by mouth daily. 30 tablet  6   insulin aspart (NOVOLOG) 100 UNIT/ML FlexPen Inject 10-17 units into the skin 3 times daily before meals. (Patient taking differently: Inject 10 Units into the skin 3 (three) times daily with meals.) 45 mL 3   Lancets (ONETOUCH DELICA PLUS IRCVEL38B) MISC Use 4 times daily before meals and nightly. 400 each 3   lisinopril (ZESTRIL) 10 MG tablet Take 1 tablet (10 mg total) by mouth daily. 90 tablet 3   Multiple Vitamins-Minerals (MULTIVITAMIN ADULT) CHEW Chew 2 tablets by mouth daily.     mupirocin cream (BACTROBAN) 2 % Apply topically daily. Apply to left foot wound and cover with dry dressing once daily 30 g 0   TOUJEO SOLOSTAR 300 UNIT/ML Solostar Pen Inject 80 Units into the skin nightly. 30 mL 3   TRUEPLUS PEN NEEDLES 31G X 5 MM MISC See admin instructions.  11   VITAMIN D PO Take 2 tablets by mouth at bedtime.     No current facility-administered medications for this visit.    Allergies  Allergen Reactions   Other Swelling    Seaweed= swelling on arms, hands and face   Metformin And Related Nausea Only     REVIEW OF SYSTEMS:  '[X]'$  denotes positive finding, '[ ]'$  denotes negative finding Cardiac  Comments:  Chest pain or chest pressure:    Shortness of breath upon exertion:    Short of breath when lying flat:    Irregular heart rhythm:        Vascular    Pain in calf, thigh, or hip brought on by ambulation:    Pain in feet at night that wakes you  up from your sleep:     Blood clot in your veins:    Leg swelling:         Pulmonary    Oxygen at home:    Productive cough:     Wheezing:         Neurologic    Sudden weakness in arms or legs:     Sudden numbness in arms or legs:     Sudden onset of difficulty speaking or slurred speech:    Temporary loss of vision in one eye:     Problems with dizziness:         Gastrointestinal    Blood in stool:     Vomited blood:         Genitourinary    Burning when urinating:     Blood in urine:        Psychiatric    Major depression:         Hematologic    Bleeding problems:    Problems with blood clotting too easily:        Skin    Rashes or ulcers:        Constitutional    Fever or chills:      PHYSICAL EXAMINATION:  Vitals:   08/01/22 1026  BP: (!) 149/61  Pulse: 62  Resp: 20  Temp: 98.1 F (36.7 C)  TempSrc: Temporal  SpO2: 96%  Weight: 216 lb 1.6 oz (98 kg)  Height: 6' (1.829 m)    General:  WDWN in NAD; vital signs documented above Gait: uses cane HENT: WNL, normocephalic Pulmonary: normal non-labored breathing , without wheezing Cardiac: regular HR, without  Murmurs  Vascular Exam/Pulses:  Right Left  Radial 2+ (normal) 2+ (normal)  Femoral 2+ (normal) 2+ (normal)  Popliteal 2+ (normal) 2+ (normal)  DP Not palpable Not palpable  PT  Not palpable Not palpable  Brisk doppler left DP/ PT/ Peroneal signals. Right brisk DP and Pero, PT faint Extremities: without ischemic changes, without Gangrene , without cellulitis; without open wounds; bilateral lower extremities edematous L> R. Hyperpigmentation of bilateral distal legs Musculoskeletal: no muscle wasting or atrophy  Neurologic: A&O X 3;  No focal weakness or paresthesias are detected Psychiatric:  The pt has Normal affect.  ASSESSMENT/PLAN:: 66 y.o. female here for evaluation of worsening swelling, discoloration and pain in left leg. She has brisk signals in both lower extremities so I do not think  she has any acute limb ischemia and she has no rest pain or tissue loss. Her previous ABIs in June were normal. I think mostly her symptoms are related to her venous insufficiency and neuropathy. We have not evaluated her for venous insufficiency before. I have encouraged her to continue to elevate her legs daily. Continue her Aspirin, Statin, and Plavix. I will have her return with LLE reflux study in near future  Karoline Caldwell, [place] [facility] [phone]  Clinic MD:   Roxanne Mins

## 2022-08-03 ENCOUNTER — Other Ambulatory Visit: Payer: Self-pay | Admitting: *Deleted

## 2022-08-03 DIAGNOSIS — M7989 Other specified soft tissue disorders: Secondary | ICD-10-CM

## 2022-08-07 ENCOUNTER — Ambulatory Visit (HOSPITAL_COMMUNITY)
Admission: RE | Admit: 2022-08-07 | Discharge: 2022-08-07 | Disposition: A | Discharge status: Home / Self Care | Payer: Medicare Other | Source: Ambulatory Visit | Attending: Surgery | Admitting: Surgery

## 2022-08-07 DIAGNOSIS — M7989 Other specified soft tissue disorders: Secondary | ICD-10-CM | POA: Diagnosis not present

## 2022-08-14 NOTE — Progress Notes (Unsigned)
HISTORY AND PHYSICAL     CC:  follow up. Requesting Provider:  Ladell Pier, MD  HPI: [name] who is here today for follow up for PAD.  Pt has hx of left lower extremity arteriogram with balloon angioplasty of the anterior tibial artery by Dr. Carlis Abbott on 04/06/2022. Following that she underwent an office debridement of a left fifth toe ulceration with osteomyelitis by podiatry, Dr. Sherryle Lis.  At her visit in June, her ABI was 1.05 on the right and 1.09 on the left and her wounds had healed.    Pt was last seen 08/01/2022 and at that time, she had concerns of swelling, pain and discoloration of the left leg that had been present for several days.  She had been trying to elevate her leg, which helped but once she was back on her feet, the swelling returned.  She had try to use compression, which did make her legs feel better but noticed after wearing them, her legs were ghost white.  This scared her so she stopped wearing them.  She had been having stabbing and tingling in her left foot mostly with weight bearing and could be very painful at times.  She was taking gabapentin but did not feel it was helping.  She did not have claudication or rest pain or non healing wounds.    She was scheduled to return to check venous duplex for insufficiency and comes in today for those results.    The pt returns today for follow up.  She states that her legs are not very swollen today.  She states she has more swelling in the left than the right.    The pt is on a statin for cholesterol management.    The pt is on an aspirin.    Other AC:  Plavix The pt is on BB, CCB, ACEI, hydralazine for hypertension.  The pt does  have diabetes. Tobacco hx:  never    Past Medical History:  Diagnosis Date   Anemia    requiring transfusion   Boil of buttock ~ 2016   Depression    Diabetic neuropathy ([place])    Archie Endo 08/23/2017   DKA (diabetic ketoacidoses)    recent/notes 01/06/2015    Hyperlipidemia    Hypertension    Neuropathy    Peripheral vascular disease ([place])    Toe ulcer ([place])    left great toe/notes 08/23/2017   Type II diabetes mellitus ([place])     Past Surgical History:  Procedure Laterality Date   ABDOMINAL AORTOGRAM W/LOWER EXTREMITY N/A 04/06/2022   Procedure: ABDOMINAL AORTOGRAM W/LOWER EXTREMITY;  Surgeon: Marty Heck, MD;  Location: [facility];  Service: Cardiovascular;  Laterality: N/A;   AMPUTATION TOE Left 10/26/2017   Procedure: AMPUTATION PARTIAL RAY LEFT FOOT AND IRRIGATION/DEBRIDEMENT;  Surgeon: Edrick Kins, DPM;  Location: [facility];  Service: Podiatry;  Laterality: Left;  AMPUTATION PARTIAL RAY LEFT FOOT AND IRRIGATION/DEBRIDEMENT   CHOLECYSTECTOMY  01/24/2003   Archie Endo 04/04/2011   COLONOSCOPY     INCISION AND DRAINAGE ABSCESS  03/17/2012   "right but; left lower abdoment"/notes 03/17/2012   LOWER EXTREMITY INTERVENTION N/A 09/10/2017   Procedure: LOWER EXTREMITY INTERVENTION;  Surgeon: Lorretta Harp, MD;  Location: [facility];  Service: Cardiovascular;  Laterality: N/A;   PERIPHERAL VASCULAR ATHERECTOMY  09/10/2017   Procedure: PERIPHERAL VASCULAR ATHERECTOMY;  Surgeon: Lorretta Harp, MD;  Location: [facility];  Service: Cardiovascular;;  left AT  PERIPHERAL VASCULAR BALLOON ANGIOPLASTY Left 04/06/2022   Procedure: PERIPHERAL VASCULAR BALLOON ANGIOPLASTY;  Surgeon: Marty Heck, MD;  Location: [facility];  Service: Cardiovascular;  Laterality: Left;    Allergies  Allergen Reactions   Other Swelling    Seaweed= swelling on arms, hands and face   Metformin And Related Nausea Only    Current Outpatient Medications  Medication Sig Dispense Refill   amLODipine (NORVASC) 10 MG tablet Take 1 tablet (10 mg total) by mouth daily. 30 tablet 6   aspirin EC 81 MG tablet Take 1 tablet (81 mg total) by mouth daily. Swallow whole. 150 tablet 2   atorvastatin (LIPITOR) 80 MG tablet Take 1 tablet (80  mg total) by mouth daily at 6 PM. (Patient taking differently: Take 80 mg by mouth at bedtime.) 30 tablet 6   BAQSIMI ONE PACK 3 MG/DOSE POWD Place 1 spray into the nose as needed (Low blood sugar).     Carboxymethylcellulose Sodium (EYE DROPS OP) Place 1 drop into both eyes daily.     carvedilol (COREG) 6.25 MG tablet Take 1 tablet (6.25 mg total) by mouth 2 (two) times daily with a meal. (Patient taking differently: Take 6.25 mg by mouth daily.) 60 tablet 6   clopidogrel (PLAVIX) 75 MG tablet TAKE 1 TABLET BY MOUTH EVERY DAY WITH BREAKFAST 90 tablet 3   Continuous Blood Gluc Receiver (FREESTYLE LIBRE 2 READER) DEVI Use as directed 3 times daily 1 each 12   Continuous Blood Gluc Receiver (FREESTYLE LIBRE 2 READER) DEVI Scan as needed for continuous glucose monitoring. 1 each 0   Continuous Blood Gluc Sensor (FREESTYLE LIBRE 2 SENSOR) MISC Change sensor every 2 weeks. 2 each 12   Continuous Blood Gluc Sensor (FREESTYLE LIBRE 2 SENSOR) MISC Inject 1 sensor to the skin every 14 days for continuous glucose monitoring. 2 each 11   gabapentin (NEURONTIN) 600 MG tablet Take 1 tablet (600 mg total) by mouth 2 (two) times daily. (Patient taking differently: Take 650 mg by mouth 2 (two) times daily.) 60 tablet 6   glucose blood (ONETOUCH VERIO) test strip 1 each by Misc.(Non-Drug; Combo Route) route 4 times daily before meals and nightly. 400 each 3   hydrALAZINE (APRESOLINE) 100 MG tablet Take 1 tablet (100 mg total) by mouth 3 (three) times daily. 90 tablet 6   hydrochlorothiazide (HYDRODIURIL) 25 MG tablet Take 1 tablet (25 mg total) by mouth daily. 30 tablet 6   insulin aspart (NOVOLOG) 100 UNIT/ML FlexPen Inject 10-17 units into the skin 3 times daily before meals. (Patient taking differently: Inject 10 Units into the skin 3 (three) times daily with meals.) 45 mL 3   Lancets (ONETOUCH DELICA PLUS [id number]) MISC Use 4 times daily before meals and nightly. 400 each 3   lisinopril (ZESTRIL) 10 MG tablet  Take 1 tablet (10 mg total) by mouth daily. 90 tablet 3   Multiple Vitamins-Minerals (MULTIVITAMIN ADULT) CHEW Chew 2 tablets by mouth daily.     mupirocin cream (BACTROBAN) 2 % Apply topically daily. Apply to left foot wound and cover with dry dressing once daily 30 g 0   TOUJEO SOLOSTAR 300 UNIT/ML Solostar Pen Inject 80 Units into the skin nightly. 30 mL 3   TRUEPLUS PEN NEEDLES 31G X 5 MM MISC See admin instructions.  11   VITAMIN D PO Take 2 tablets by mouth at bedtime.     No current facility-administered medications for this visit.    Family History  Problem Relation  Age of Onset   Diabetes Father    Hypertension Father    Kidney disease Mother    Hypertension Mother    Cancer Maternal Grandmother    Hypertension Maternal Grandfather    Diabetes Paternal Aunt    Diabetes Paternal Uncle    Diabetes Paternal Grandfather    Colon cancer Neg Hx    Breast cancer Neg Hx     Social History   Socioeconomic History   Marital status: Married    Spouse name: Not on file   Number of children: Not on file   Years of education: Not on file   Highest education level: Not on file  Occupational History   Not on file  Tobacco Use   Smoking status: Never    Passive exposure: Never   Smokeless tobacco: Never  Vaping Use   Vaping Use: Never used  Substance and Sexual Activity   Alcohol use: No   Drug use: No   Sexual activity: Never  Other Topics Concern   Not on file  Social History Narrative   Not on file   Social Determinants of Health   Financial Resource Strain: Not on file  Food Insecurity: Not on file  Transportation Needs: Not on file  Physical Activity: Not on file  Stress: Not on file  Social Connections: Not on file  Intimate Partner Violence: Not on file     REVIEW OF SYSTEMS:   '[X]'$  denotes positive finding, '[ ]'$  denotes negative finding Cardiac  Comments:  Chest pain or chest pressure:    Shortness of breath upon exertion:    Short of breath when  lying flat:    Irregular heart rhythm:        Vascular    Pain in calf, thigh, or hip brought on by ambulation:    Pain in feet at night that wakes you up from your sleep:     Blood clot in your veins:    Leg swelling:         Pulmonary    Oxygen at home:    Productive cough:     Wheezing:         Neurologic    Sudden weakness in arms or legs:     Sudden numbness in arms or legs:     Sudden onset of difficulty speaking or slurred speech:    Temporary loss of vision in one eye:     Problems with dizziness:         Gastrointestinal    Blood in stool:     Vomited blood:         Genitourinary    Burning when urinating:     Blood in urine:        Psychiatric    Major depression:         Hematologic    Bleeding problems:    Problems with blood clotting too easily:        Skin    Rashes or ulcers:        Constitutional    Fever or chills:      PHYSICAL EXAMINATION:  Today's Vitals   08/15/22 1400  BP: (!) 179/74  Pulse: (!) 57  Resp: 20  Temp: 98.6 F (37 C)  TempSrc: Temporal  SpO2: 98%  Weight: 218 lb (98.9 kg)  Height: 6' (1.829 m)   Body mass index is 29.57 kg/m.   General:  WDWN in NAD; vital signs documented above Gait: slow with a cane  HENT: WNL, normocephalic Pulmonary: normal non-labored breathing , without wheezing Cardiac: regular HR Skin: without rashes Vascular Exam/Pulses: Palpable bilateral radial pulses Palpable bilateral DP pulses Extremities: hemosiderin staining BLE; BLE with left>right; well healed left great toe amputation site.  Musculoskeletal: no muscle wasting or atrophy  Neurologic: A&O X 3 Psychiatric:  The pt has Normal affect.   Non-Invasive Vascular Imaging:   Venous duplex 08/07/2022: +--------------+---------+------+-----------+------------+--------+  LEFT          Reflux NoRefluxReflux TimeDiameter cmsComments                          Yes                                    +--------------+---------+------+-----------+------------+--------+  CFV           no                                              +--------------+---------+------+-----------+------------+--------+  FV prox       no                                              +--------------+---------+------+-----------+------------+--------+  FV mid        no                                              +--------------+---------+------+-----------+------------+--------+  FV dist       no                                              +--------------+---------+------+-----------+------------+--------+  Popliteal     no                                              +--------------+---------+------+-----------+------------+--------+  GSV at [facility]    no                            0.85              +--------------+---------+------+-----------+------------+--------+  GSV prox thigh          yes    >500 ms      0.50              +--------------+---------+------+-----------+------------+--------+  GSV mid thigh no                            0.47              +--------------+---------+------+-----------+------------+--------+  GSV dist thighno                            0.38              +--------------+---------+------+-----------+------------+--------+  GSV at knee   no                            0.46              +--------------+---------+------+-----------+------------+--------+  GSV prox calf no                            0.35              +--------------+---------+------+-----------+------------+--------+  SSV Pop Fossa no                            0.24              +--------------+---------+------+-----------+------------+--------+  SSV prox calf no                            0.29              +--------------+---------+------+-----------+------------+--------+  Summary:  Left:  - No evidence of deep vein thrombosis  seen in the left lower extremity, from the common femoral through the popliteal veins.  - No evidence of superficial venous thrombosis in the left lower extremity.  - No evidence of superficial venous reflux seen in the left short saphenous vein.  - Venous reflux is noted in the left greater saphenous vein in the thigh.   ABI's/TBI's on 05/16/2022: Right:  1.05/0.61 - Great toe pressure: 99 Left:  1.09 - Great toe pressure: amputation  Arterial duplex on 05/16/2022: Left: Widely patent left lower extremity arterial system.  Biphasic waveforms observed in the mid and distal anterior tibial artery.     ASSESSMENT/PLAN:: 66 y.o. female here for follow up for PAD with  hx of left lower extremity arteriogram with balloon angioplasty of the anterior tibial artery by Dr. Carlis Abbott on 04/06/2022. Following that she underwent an office debridement of a left fifth toe ulceration with osteomyelitis by podiatry, Dr. Sherryle Lis.    She presents today for venous duplex after she had concerns for leg swelling   Leg swelling  -pt has palpable DP pedal pulses bilaterally -pt does not have evidence of DVT.  Pt only has venous reflux in the GSV in the thigh.  She does not have any reflux in the deep venous system or at the [facility].  There is no indication for laser ablation.  -discussed with pt about wearing knee high 15-20 mmHg compression stockings and pt was measured for these today.    -discussed the importance of leg elevation and how to elevate properly - pt is advised to elevate their legs and a diagram is given to them to demonstrate for pt to lay flat on their back with knees elevated and slightly bent with their feet higher than their knees, which puts their feet higher than their heart for 15 minutes per day.  If pt cannot lay flat, advised to lay as flat as possible.  -pt is advised to continue as much walking as possible and avoid sitting or standing for long periods of time.  -discussed importance of weight  loss and exercise and that water aerobics would also be beneficial.  -handout with recommendations given  -given the increased LLE swelling, she may need CT venogram for evaluation of May Thurner if she continues to have bothersome LLE swelling.    PAD -pt will  f/u in December with ABI and arterial duplex.  She did have palpable DP pulses today. -continue asa/statin/plavix   Leontine Locket, [facility] [phone]  Clinic MD:   Carlis Abbott

## 2022-08-15 ENCOUNTER — Encounter: Payer: Self-pay | Admitting: Physician Assistant

## 2022-08-15 ENCOUNTER — Ambulatory Visit (INDEPENDENT_AMBULATORY_CARE_PROVIDER_SITE_OTHER): Payer: Medicare Other | Admitting: Physician Assistant

## 2022-08-15 VITALS — BP 179/74 | HR 57 | Temp 98.6°F | Resp 20 | Ht 72.0 in | Wt 218.0 lb

## 2022-08-15 DIAGNOSIS — M7989 Other specified soft tissue disorders: Secondary | ICD-10-CM

## 2022-08-16 NOTE — Progress Notes (Signed)
This encounter was created in error - please disregard.

## 2022-08-21 ENCOUNTER — Encounter: Payer: Self-pay | Admitting: Pharmacist

## 2022-08-21 DIAGNOSIS — Z9229 Personal history of other drug therapy: Secondary | ICD-10-CM

## 2022-08-21 NOTE — Progress Notes (Signed)
[name])                                            [place] Team                                        Statin Quality Measure Assessment    08/21/2022  Katherine [name] 02-Jul-1956 [id number]  Per review of chart and payor information, patient has a diagnosis of diabetes but is not currently filling a statin prescription.  This places patient into the SUPD (Statin Use In Patients with Diabetes) measure for CMS.    Atorvastatin is on the patient's medication list but has not been filled since December of 2022.   She has an upcoming appointment on 08/22/2022.  If deemed therapeutically appropriate, please review the patient's refill history of Atorvastatin and send in a new prescription to the patient's local pharmacy.  If the patient has experienced statin intolerance, please consider alternative statin dosing or associating a statin exclusion code to the upcoming visit if the patient experienced issues taking statins.   The 10-year ASCVD risk score (Arnett DK, et al., 2019) is: 37.5%   Values used to calculate the score:     Age: 66 years     Sex: Female     Is Non-Hispanic African American: Yes     Diabetic: Yes     Tobacco smoker: No     Systolic Blood Pressure: 793 mmHg     Is BP treated: Yes     HDL Cholesterol: 56 mg/dL     Total Cholesterol: 175 mg/dL 02/23/2021     Component Value Date/Time   CHOL 175 02/23/2021 1025   TRIG 106 02/23/2021 1025   HDL 56 02/23/2021 1025   CHOLHDL 3.1 02/23/2021 1025   CHOLHDL 3.8 09/19/2016 1452   VLDL 36 (H) 09/19/2016 1452   LDLCALC 100 (H) 02/23/2021 1025    Please consider ONE of the following recommendations:  Initiate high intensity statin Atorvastatin '40mg'$  once daily, #90, 3 refills   Rosuvastatin '20mg'$  once daily, #90, 3 refills    Initiate moderate intensity          statin with reduced frequency if prior          statin intolerance 1x weekly, #13, 3 refills    2x weekly, #26, 3 refills   3x weekly, #39, 3 refills    Code for past statin intolerance or  other exclusions (required annually)   Provider Requirements:  Associate code during an office visit or telehealth encounter  Drug Induced Myopathy G72.0   Myopathy, unspecified G72.9   Myositis, unspecified M60.9   Rhabdomyolysis J03.00   Alcoholic fatty liver P23.3   Cirrhosis of liver K74.69   Prediabetes R73.03   PCOS E28.2   Toxic liver disease, unspecified K71.9         Plan: Route note to patient's PCP prior to upcoming appointment.  Elayne Guerin, PharmD, [place] Clinical Pharmacist [phone]

## 2022-08-22 ENCOUNTER — Encounter: Payer: Self-pay | Admitting: Internal Medicine

## 2022-08-22 ENCOUNTER — Ambulatory Visit: Payer: Medicare Other | Attending: Internal Medicine | Admitting: Internal Medicine

## 2022-08-22 VITALS — BP 156/82 | HR 56 | Wt 218.4 lb

## 2022-08-22 DIAGNOSIS — Z794 Long term (current) use of insulin: Secondary | ICD-10-CM | POA: Diagnosis not present

## 2022-08-22 DIAGNOSIS — Z23 Encounter for immunization: Secondary | ICD-10-CM

## 2022-08-22 DIAGNOSIS — E1159 Type 2 diabetes mellitus with other circulatory complications: Secondary | ICD-10-CM

## 2022-08-22 DIAGNOSIS — I10 Essential (primary) hypertension: Secondary | ICD-10-CM | POA: Diagnosis not present

## 2022-08-22 DIAGNOSIS — R600 Localized edema: Secondary | ICD-10-CM

## 2022-08-22 DIAGNOSIS — E1151 Type 2 diabetes mellitus with diabetic peripheral angiopathy without gangrene: Secondary | ICD-10-CM

## 2022-08-22 DIAGNOSIS — I152 Hypertension secondary to endocrine disorders: Secondary | ICD-10-CM | POA: Diagnosis not present

## 2022-08-22 DIAGNOSIS — E1142 Type 2 diabetes mellitus with diabetic polyneuropathy: Secondary | ICD-10-CM

## 2022-08-22 LAB — GLUCOSE, POCT (MANUAL RESULT ENTRY): POC Glucose: 94 mg/dl (ref 70–99)

## 2022-08-22 LAB — POCT GLYCOSYLATED HEMOGLOBIN (HGB A1C): HbA1c, POC (controlled diabetic range): 9.6 % — AB (ref 0.0–7.0)

## 2022-08-22 MED ORDER — LISINOPRIL 20 MG PO TABS: 20.0000 mg | ORAL_TABLET | Freq: Every day | ORAL | 6 refills | Status: DC

## 2022-08-22 MED ORDER — FREESTYLE LIBRE 2 READER DEVI: 0 refills | Status: DC

## 2022-08-22 MED ORDER — FREESTYLE LIBRE 2 SENSOR MISC: 11 refills | Status: DC

## 2022-08-22 NOTE — Progress Notes (Signed)
Patient ID: Katherine [name], female    DOB: 1956/05/04  MRN: [id number]  CC: chronic ds management   Subjective: Katherine [name] is a 66 y.o. female who presents for chronic ds management Her concerns today include:  Hx of HTN, DM with neuropathy, retinopathy and possible gastroparesis, HL, depression, GERD, PAD, s/p amputation LT 1st toe, BL RAS 59%.   DM: Results for orders placed or performed in visit on 08/22/22  POCT glucose (manual entry)  Result Value Ref Range   POC Glucose 94 70 - 99 mg/dl  POCT glycosylated hemoglobin (Hb A1C)  Result Value Ref Range   Hemoglobin A1C     HbA1c POC (<> result, manual entry)     HbA1c, POC (prediabetic range)     HbA1c, POC (controlled diabetic range) 9.6 (A) 0.0 - 7.0 %   -not checking BS.  "Not able to get blood out of these fingers." Has had problems with Dexcom staying on her skin.  Wants to go back to [place] Had to cancel appt with her Endo in [place] last wk due to lack of transportation.  Would like to see someone else because when ever she goes to his office he gets sick -on Toujeo 80 units daily and Novolog 15 units with each meal (taking 3-4 days a day which she finds to be excessive) -due for eye exam.  Wants to change from [facility]   HTN: Reports compliance with taking lisinopril 10 mg daily, hydralazine 100 mg 3 times a day, HCTZ 25 mg daily.  Reports she takes carvedilol  2 x a wk.  She forgets why she is not taking every today Checks BP once a mth.  SBP never over 140 Has some chronic swelling in the left lower leg.  She is seen vascular surgeon PA recently.  Does not have blood clot in the leg.  According to the note, she only has venous reflux in the GSV in the thigh.  She does not have any reflux in the deep venous system or at the [facility].  They recommended compression socks and she was measured for it.  Patient encouraged to keep leg elevated.  If no improvement, they plan to do CT venogram for evaluation of May  Thurner.  Patient states she just got the compression socks yesterday but has not started wearing it as yet.  HM:  declines flu shot today.  Will get 2nd Shingrix today Patient Active Problem List   Diagnosis Date Noted   Diabetic ulcer of left foot associated with type 2 diabetes mellitus ([place]) 04/10/2022   Medication monitoring encounter 04/10/2022   Cellulitis of left lower extremity    Acute osteomyelitis of left foot ([place]) 03/29/2022   Neuropathy 12/08/2021   Diabetic gastroparesis associated with type 2 diabetes mellitus ([place]) 07/30/2021   Type 2 diabetes mellitus with diabetic peripheral angiopathy without gangrene, with long-term current use of insulin ([place]) 07/30/2021   Amputation of left great toe ([place]) 06/13/2021   Microalbuminuria due to type 2 diabetes mellitus ([place]) 05/04/2021   Diabetes ([place]) 12/22/2019   Renal artery stenosis ([place]) 12/01/2019   Diabetic polyneuropathy associated with type 2 diabetes mellitus ([place]) 05/14/2018   Thyroid nodule 10/04/2017   Microcytic anemia 09/11/2017   Critical lower limb ischemia ([place]) 09/10/2017   Retinopathy 08/22/2017   Thyroid enlargement 07/12/2017   Dyslipidemia 09/20/2016   DEPRESSION 09/10/2009   LEG CRAMPS 05/19/2009   GERD 02/15/2009   Gastroparesis 09/03/2008   DENTAL CARIES 07/14/2008  Essential hypertension 09/16/2003     Current Outpatient Medications on File Prior to Visit  Medication Sig Dispense Refill   amLODipine (NORVASC) 10 MG tablet Take 1 tablet (10 mg total) by mouth daily. 30 tablet 6   aspirin EC 81 MG tablet Take 1 tablet (81 mg total) by mouth daily. Swallow whole. 150 tablet 2   atorvastatin (LIPITOR) 80 MG tablet Take 1 tablet (80 mg total) by mouth daily at 6 PM. (Patient taking differently: Take 80 mg by mouth at bedtime.) 30 tablet 6   BAQSIMI ONE PACK 3 MG/DOSE POWD Place 1 spray into the nose as needed (Low blood sugar).     Carboxymethylcellulose Sodium (EYE DROPS OP) Place 1 drop into both  eyes daily.     carvedilol (COREG) 6.25 MG tablet Take 1 tablet (6.25 mg total) by mouth 2 (two) times daily with a meal. (Patient taking differently: Take 6.25 mg by mouth daily.) 60 tablet 6   clopidogrel (PLAVIX) 75 MG tablet TAKE 1 TABLET BY MOUTH EVERY DAY WITH BREAKFAST 90 tablet 3   Continuous Blood Gluc Receiver (FREESTYLE LIBRE 2 READER) DEVI Use as directed 3 times daily 1 each 12   Continuous Blood Gluc Sensor (FREESTYLE LIBRE 2 SENSOR) MISC Change sensor every 2 weeks. 2 each 12   gabapentin (NEURONTIN) 600 MG tablet Take 1 tablet (600 mg total) by mouth 2 (two) times daily. (Patient taking differently: Take 650 mg by mouth 2 (two) times daily.) 60 tablet 6   glucose blood (ONETOUCH VERIO) test strip 1 each by Misc.(Non-Drug; Combo Route) route 4 times daily before meals and nightly. 400 each 3   hydrALAZINE (APRESOLINE) 100 MG tablet Take 1 tablet (100 mg total) by mouth 3 (three) times daily. 90 tablet 6   hydrochlorothiazide (HYDRODIURIL) 25 MG tablet Take 1 tablet (25 mg total) by mouth daily. 30 tablet 6   insulin aspart (NOVOLOG) 100 UNIT/ML FlexPen Inject 10-17 units into the skin 3 times daily before meals. (Patient taking differently: Inject 10 Units into the skin 3 (three) times daily with meals.) 45 mL 3   Lancets (ONETOUCH DELICA PLUS [id number]) MISC Use 4 times daily before meals and nightly. 400 each 3   lisinopril (ZESTRIL) 10 MG tablet Take 1 tablet (10 mg total) by mouth daily. 90 tablet 3   Multiple Vitamins-Minerals (MULTIVITAMIN ADULT) CHEW Chew 2 tablets by mouth daily.     mupirocin cream (BACTROBAN) 2 % Apply topically daily. Apply to left foot wound and cover with dry dressing once daily 30 g 0   TOUJEO SOLOSTAR 300 UNIT/ML Solostar Pen Inject 80 Units into the skin nightly. 30 mL 3   TRUEPLUS PEN NEEDLES 31G X 5 MM MISC See admin instructions.  11   VITAMIN D PO Take 2 tablets by mouth at bedtime.     No current facility-administered medications on file prior to  visit.    Allergies  Allergen Reactions   Other Swelling    Seaweed= swelling on arms, hands and face   Metformin And Related Nausea Only    Social History   Socioeconomic History   Marital status: Married    Spouse name: Not on file   Number of children: Not on file   Years of education: Not on file   Highest education level: Not on file  Occupational History   Not on file  Tobacco Use   Smoking status: Never    Passive exposure: Never   Smokeless tobacco: Never  Vaping Use  Vaping Use: Never used  Substance and Sexual Activity   Alcohol use: No   Drug use: No   Sexual activity: Never  Other Topics Concern   Not on file  Social History Narrative   Not on file   Social Determinants of Health   Financial Resource Strain: Not on file  Food Insecurity: Not on file  Transportation Needs: Not on file  Physical Activity: Not on file  Stress: Not on file  Social Connections: Not on file  Intimate Partner Violence: Not on file    Family History  Problem Relation Age of Onset   Diabetes Father    Hypertension Father    Kidney disease Mother    Hypertension Mother    Cancer Maternal Grandmother    Hypertension Maternal Grandfather    Diabetes Paternal Aunt    Diabetes Paternal Uncle    Diabetes Paternal Grandfather    Colon cancer Neg Hx    Breast cancer Neg Hx     Past Surgical History:  Procedure Laterality Date   ABDOMINAL AORTOGRAM W/LOWER EXTREMITY N/A 04/06/2022   Procedure: ABDOMINAL AORTOGRAM W/LOWER EXTREMITY;  Surgeon: Marty Heck, MD;  Location: [facility];  Service: Cardiovascular;  Laterality: N/A;   AMPUTATION TOE Left 10/26/2017   Procedure: AMPUTATION PARTIAL RAY LEFT FOOT AND IRRIGATION/DEBRIDEMENT;  Surgeon: Edrick Kins, DPM;  Location: [facility];  Service: Podiatry;  Laterality: Left;  AMPUTATION PARTIAL RAY LEFT FOOT AND IRRIGATION/DEBRIDEMENT   CHOLECYSTECTOMY  01/24/2003   Archie Endo 04/04/2011   COLONOSCOPY     INCISION AND  DRAINAGE ABSCESS  03/17/2012   "right but; left lower abdoment"/notes 03/17/2012   LOWER EXTREMITY INTERVENTION N/A 09/10/2017   Procedure: LOWER EXTREMITY INTERVENTION;  Surgeon: Lorretta Harp, MD;  Location: [facility];  Service: Cardiovascular;  Laterality: N/A;   PERIPHERAL VASCULAR ATHERECTOMY  09/10/2017   Procedure: PERIPHERAL VASCULAR ATHERECTOMY;  Surgeon: Lorretta Harp, MD;  Location: [facility];  Service: Cardiovascular;;  left AT   PERIPHERAL VASCULAR BALLOON ANGIOPLASTY Left 04/06/2022   Procedure: PERIPHERAL VASCULAR BALLOON ANGIOPLASTY;  Surgeon: Marty Heck, MD;  Location: [facility];  Service: Cardiovascular;  Laterality: Left;    ROS: Review of Systems Negative except as stated above  PHYSICAL EXAM: BP (!) 156/82   Pulse (!) 56   Wt 218 lb 6.4 oz (99.1 kg)   SpO2 97%   BMI 29.62 kg/m   Physical Exam BP 160/90 General appearance - alert, well appearing, and in no distress Mental status - normal mood, behavior, speech, dress, motor activity, and thought processes Chest - clear to auscultation, no wheezes, rales or rhonchi, symmetric air entry Heart - normal rate, regular rhythm, normal S1, S2, no murmurs, rubs, clicks or gallops Extremities -1+ bilateral lower extremity edema left much greater than right. Diabetic Foot Exam - Simple   Simple Foot Form Visual Inspection See comments: Yes Sensation Testing See comments: Yes Pulse Check See comments: Yes Comments Patient is flat-footed. Decreased dorsalis pedis and posterior tibialis pulses bilaterally. Decrease sensation on plantar surface on leap exam. Amputated left big toe.  Left little toe looks healed.         Latest Ref Rng & Units 04/24/2022   11:52 AM 04/06/2022    8:07 AM 04/06/2022    7:17 AM  CMP  Glucose 65 - 99 mg/dL 220  141  135   BUN 7 - 25 mg/dL '18  14  20   '$ Creatinine 0.50 -  1.05 mg/dL 0.95  0.60  0.80   Sodium 135 - 146 mmol/L 142  139  139    Potassium 3.5 - 5.3 mmol/L 3.8  3.5  5.9   Chloride 98 - 110 mmol/L 102  104  100   CO2 20 - 32 mmol/L 33     Calcium 8.6 - 10.4 mg/dL 9.4     Total Protein 6.1 - 8.1 g/dL 7.0     Total Bilirubin 0.2 - 1.2 mg/dL 0.6     AST 10 - 35 U/L 10     ALT 6 - 29 U/L 8      Lipid Panel     Component Value Date/Time   CHOL 175 02/23/2021 1025   TRIG 106 02/23/2021 1025   HDL 56 02/23/2021 1025   CHOLHDL 3.1 02/23/2021 1025   CHOLHDL 3.8 09/19/2016 1452   VLDL 36 (H) 09/19/2016 1452   LDLCALC 100 (H) 02/23/2021 1025    CBC    Component Value Date/Time   WBC 4.6 04/24/2022 1152   RBC 4.29 04/24/2022 1152   HGB 12.0 04/24/2022 1152   HGB 13.1 05/03/2021 1130   HCT 37.3 04/24/2022 1152   HCT 40.3 05/03/2021 1130   PLT 211 04/24/2022 1152   PLT 235 05/03/2021 1130   MCV 86.9 04/24/2022 1152   MCV 87 05/03/2021 1130   MCH 28.0 04/24/2022 1152   MCHC 32.2 04/24/2022 1152   RDW 14.0 04/24/2022 1152   RDW 12.5 05/03/2021 1130   LYMPHSABS 1.7 04/01/2022 0132   LYMPHSABS 1.9 08/28/2018 1424   MONOABS 0.7 04/01/2022 0132   EOSABS 0.2 04/01/2022 0132   EOSABS 0.2 08/28/2018 1424   BASOSABS 0.0 04/01/2022 0132   BASOSABS 0.0 08/28/2018 1424    ASSESSMENT AND PLAN: 1. Type 2 diabetes mellitus with diabetic peripheral neuropathy -Patient does not have any blood sugar readings to share for [place] to make any adjustments to her insulins.  She also canceled her appointment last week with the endocrinologist due to transportation issues.  -She would like to try the freestyle libre device since she has not had any luck with the Dexcom staying attached to her skin.  Prescription sent. -Advised patient that we can refer her back to [facility] endocrinology but currently it can take up to 3 to 5 months to get in with them as they are down one provider.  She agrees to stay with the endocrinologist at [facility].  She will call to reschedule. - POCT glucose (manual entry) - POCT glycosylated  hemoglobin (Hb A1C) - Continuous Blood Gluc Sensor (FREESTYLE LIBRE 2 SENSOR) MISC; Inject 1 sensor to the skin every 14 days for continuous glucose monitoring.  Dispense: 2 each; Refill: 11 - Continuous Blood Gluc Receiver (FREESTYLE LIBRE 2 READER) DEVI; Scan as needed for continuous glucose monitoring.  Dispense: 1 each; Refill: 0 - Ambulatory referral to Ophthalmology  2. Hypertension associated with diabetes ([place]) Not at goal.  She does not recall why she was not taking the carvedilol every day.  However I note that her pulse rate is in the 50s.  Initially I had told her to take the carvedilol every day, however having seen a pulse rate and seeing that her GFR is okay, I recommend that we increase the lisinopril instead from 10 mg daily to 20 mg daily.  After she has been on the increased dose for about 2 weeks, she should return to the lab to have chemistry done to check kidney function and  potassium level.  3. Need for shingles vaccine Given second Shingrix vaccine today.  4. Edema of both lower legs Encouraged her to give a trial of using the compression socks as prescribed by the vascular surgeon.     Patient was given the opportunity to ask questions.  Patient verbalized understanding of the plan and was able to repeat key elements of the plan.   This documentation was completed using Radio producer.  Any transcriptional errors are unintentional.  Orders Placed This Encounter  Procedures   Varicella-zoster vaccine IM   Ambulatory referral to Ophthalmology   POCT glucose (manual entry)   POCT glycosylated hemoglobin (Hb A1C)     Requested Prescriptions   Signed Prescriptions Disp Refills   Continuous Blood Gluc Sensor (FREESTYLE LIBRE 2 SENSOR) MISC 2 each 11    Sig: Inject 1 sensor to the skin every 14 days for continuous glucose monitoring.   Continuous Blood Gluc Receiver (FREESTYLE LIBRE 2 READER) DEVI 1 each 0    Sig: Scan as needed for  continuous glucose monitoring.    Return in about 3 months (around 11/22/2022) for Appt with Lurena Joiner in 2 wks for [employer].  Karle Plumber, MD, FACP

## 2022-08-22 NOTE — Patient Instructions (Addendum)
I have sent prescription to your pharmacy for the [place] device. Please call and schedule your follow-up appointment with the endocrinologist in [place].  Your blood pressure is not at goal.  Please take the carvedilol at least once a day.  Try wearing the compression socks especially on the left leg.

## 2022-10-05 ENCOUNTER — Encounter: Payer: Self-pay | Admitting: Internal Medicine

## 2022-10-05 ENCOUNTER — Ambulatory Visit: Payer: Medicare Other | Attending: Internal Medicine | Admitting: Internal Medicine

## 2022-10-05 ENCOUNTER — Ambulatory Visit: Payer: Medicare Other

## 2022-10-05 VITALS — BP 160/84 | HR 57 | Temp 98.1°F | Ht 71.0 in | Wt 218.0 lb

## 2022-10-05 DIAGNOSIS — Z7189 Other specified counseling: Secondary | ICD-10-CM | POA: Diagnosis not present

## 2022-10-05 DIAGNOSIS — I152 Hypertension secondary to endocrine disorders: Secondary | ICD-10-CM

## 2022-10-05 DIAGNOSIS — Z23 Encounter for immunization: Secondary | ICD-10-CM | POA: Diagnosis not present

## 2022-10-05 DIAGNOSIS — Z0000 Encounter for general adult medical examination without abnormal findings: Secondary | ICD-10-CM

## 2022-10-05 DIAGNOSIS — F320 Major depressive disorder, single episode, mild: Secondary | ICD-10-CM

## 2022-10-05 DIAGNOSIS — E1159 Type 2 diabetes mellitus with other circulatory complications: Secondary | ICD-10-CM

## 2022-10-05 DIAGNOSIS — E1142 Type 2 diabetes mellitus with diabetic polyneuropathy: Secondary | ICD-10-CM | POA: Diagnosis not present

## 2022-10-05 DIAGNOSIS — N3941 Urge incontinence: Secondary | ICD-10-CM

## 2022-10-05 NOTE — Patient Instructions (Addendum)
Placed in [place], [place] [address] PH# [phone]  Please get COVID booster vaccine from [facility] or [facility]  I have referred you to Hypertension Clinic with Dr. Oval Linsey.

## 2022-10-05 NOTE — Progress Notes (Signed)
Subjective:    Katherine [name] is a 66 y.o. female who presents for a Welcome to Medicare exam.  Hx of HTN, DM with neuropathy, retinopathy and possible gastroparesis, HL, depression, GERD, PAD, s/p amputation LT 1st toe, BL RAS 59%.  Review of Systems CVS:  Blood pressure noted to be elevated today.  Reports compliance with taking her blood pressure medications which are Coreg 6.25 mg twice a day, amlodipine 10 mg daily, hydralazine 100 mg 3 times a day, HCTZ 25 mg daily, lisinopril 20 mg daily.  Does have home blood pressure device but has not been checking blood pressure recently.  Ate some sausage with eggs this morning for breakfast. PSY: Depression screen is positive.  She attributes this to the fact that her husband's health is failing and she has pain in her feet all the time.  She is now seeing a specialist through [facility] who has her on gabapentin 600 mg twice a day and tramadol 50 mg twice a day.  She has a follow-up appointment with her on the 18th of next month.  Endo: She received the libre continuous glucose monitor device and needs help with setting it up GU: Reports some leakage of urine when she gets the urge to urinate.  Not always able to hold it until she gets to the restroom.  She wears pads.  No leakage of urine with coughing or laughing.     Objective:    Today's Vitals   10/05/22 1155  BP: (!) 211/80  Pulse: (!) 57  Temp: 98.1 F (36.7 C)  TempSrc: Oral  SpO2: 97%  Weight: 218 lb (98.9 kg)  Height: _0  (1.803 m)  PainSc: 9   Body mass index is 30.4 kg/m.  Medications Outpatient Encounter Medications as of 10/05/2022  Medication Sig   amLODipine (NORVASC) 10 MG tablet Take 1 tablet (10 mg total) by mouth daily.   aspirin EC 81 MG tablet Take 1 tablet (81 mg total) by mouth daily. Swallow whole.   atorvastatin (LIPITOR) 80 MG tablet Take 1 tablet (80 mg total) by mouth daily at 6 PM. (Patient taking differently: Take 80 mg by mouth at bedtime.)    BAQSIMI ONE PACK 3 MG/DOSE POWD Place 1 spray into the nose as needed (Low blood sugar).   Carboxymethylcellulose Sodium (EYE DROPS OP) Place 1 drop into both eyes daily.   carvedilol (COREG) 6.25 MG tablet Take 1 tablet (6.25 mg total) by mouth 2 (two) times daily with a meal. (Patient taking differently: Take 6.25 mg by mouth daily.)   clopidogrel (PLAVIX) 75 MG tablet TAKE 1 TABLET BY MOUTH EVERY DAY WITH BREAKFAST   Continuous Blood Gluc Receiver (FREESTYLE LIBRE 2 READER) DEVI Use as directed 3 times daily   Continuous Blood Gluc Receiver (FREESTYLE LIBRE 2 READER) DEVI Scan as needed for continuous glucose monitoring.   Continuous Blood Gluc Sensor (FREESTYLE LIBRE 2 SENSOR) MISC Change sensor every 2 weeks.   Continuous Blood Gluc Sensor (FREESTYLE LIBRE 2 SENSOR) MISC Inject 1 sensor to the skin every 14 days for continuous glucose monitoring.   gabapentin (NEURONTIN) 600 MG tablet Take 1 tablet (600 mg total) by mouth 2 (two) times daily. (Patient taking differently: Take 650 mg by mouth 2 (two) times daily.)   glucose blood (ONETOUCH VERIO) test strip 1 each by Misc.(Non-Drug; Combo Route) route 4 times daily before meals and nightly.   hydrALAZINE (APRESOLINE) 100 MG tablet Take 1 tablet (100 mg total) by mouth 3 (  three) times daily.   hydrochlorothiazide (HYDRODIURIL) 25 MG tablet Take 1 tablet (25 mg total) by mouth daily.   insulin aspart (NOVOLOG) 100 UNIT/ML FlexPen Inject 10-17 units into the skin 3 times daily before meals. (Patient taking differently: Inject 10 Units into the skin 3 (three) times daily with meals.)   Lancets (ONETOUCH DELICA PLUS CBULAG53M) MISC Use 4 times daily before meals and nightly.   lisinopril (ZESTRIL) 20 MG tablet Take 1 tablet (20 mg total) by mouth daily.   Multiple Vitamins-Minerals (MULTIVITAMIN ADULT) CHEW Chew 2 tablets by mouth daily.   TOUJEO SOLOSTAR 300 UNIT/ML Solostar Pen Inject 80 Units into the skin nightly.   TRUEPLUS PEN NEEDLES 31G X  5 MM MISC See admin instructions.   VITAMIN D PO Take 2 tablets by mouth at bedtime.   mupirocin cream (BACTROBAN) 2 % Apply topically daily. Apply to left foot wound and cover with dry dressing once daily (Patient not taking: Reported on 10/05/2022)   No facility-administered encounter medications on file as of 10/05/2022.     History: Past Medical History:  Diagnosis Date   Anemia    requiring transfusion   Boil of buttock ~ 2016   Depression    Diabetic neuropathy ([place])    Archie Endo 08/23/2017   DKA (diabetic ketoacidoses)    recent/notes 01/06/2015   Hyperlipidemia    Hypertension    Neuropathy    Peripheral vascular disease ([place])    Toe ulcer ([place])    left great toe/notes 08/23/2017   Type II diabetes mellitus ([place])    Past Surgical History:  Procedure Laterality Date   ABDOMINAL AORTOGRAM W/LOWER EXTREMITY N/A 04/06/2022   Procedure: ABDOMINAL AORTOGRAM W/LOWER EXTREMITY;  Surgeon: Marty Heck, MD;  Location: [facility];  Service: Cardiovascular;  Laterality: N/A;   AMPUTATION TOE Left 10/26/2017   Procedure: AMPUTATION PARTIAL RAY LEFT FOOT AND IRRIGATION/DEBRIDEMENT;  Surgeon: Edrick Kins, DPM;  Location: [facility];  Service: Podiatry;  Laterality: Left;  AMPUTATION PARTIAL RAY LEFT FOOT AND IRRIGATION/DEBRIDEMENT   CHOLECYSTECTOMY  01/24/2003   Archie Endo 04/04/2011   COLONOSCOPY     INCISION AND DRAINAGE ABSCESS  03/17/2012   "right but; left lower abdoment"/notes 03/17/2012   LOWER EXTREMITY INTERVENTION N/A 09/10/2017   Procedure: LOWER EXTREMITY INTERVENTION;  Surgeon: Lorretta Harp, MD;  Location: [facility];  Service: Cardiovascular;  Laterality: N/A;   PERIPHERAL VASCULAR ATHERECTOMY  09/10/2017   Procedure: PERIPHERAL VASCULAR ATHERECTOMY;  Surgeon: Lorretta Harp, MD;  Location: [facility];  Service: Cardiovascular;;  left AT   PERIPHERAL VASCULAR BALLOON ANGIOPLASTY Left 04/06/2022   Procedure: PERIPHERAL VASCULAR BALLOON  ANGIOPLASTY;  Surgeon: Marty Heck, MD;  Location: [facility];  Service: Cardiovascular;  Laterality: Left;    Family History  Problem Relation Age of Onset   Diabetes Father    Hypertension Father    Kidney disease Mother    Hypertension Mother    Cancer Maternal Grandmother    Hypertension Maternal Grandfather    Diabetes Paternal Aunt    Diabetes Paternal Uncle    Diabetes Paternal Grandfather    Colon cancer Neg Hx    Breast cancer Neg Hx    Social History   Occupational History   Not on file  Tobacco Use   Smoking status: Never    Passive exposure: Never   Smokeless tobacco: Never  Vaping Use   Vaping Use: Never used  Substance and Sexual Activity   Alcohol  use: No   Drug use: No   Sexual activity: Never    Tobacco Counseling Non-smoker  Immunizations and Health Maintenance Immunization History  Administered Date(s) Administered   Influenza Split 01/13/2013   Influenza Whole 10/28/2008, 09/10/2009   Influenza,inj,Quad PF,6+ Mos 12/17/2014, 01/12/2016, 09/19/2016, 10/23/2017, 08/28/2018, 10/25/2019   Moderna Sars-Covid-2 Vaccination 03/08/2020   PNEUMOCOCCAL CONJUGATE-20 05/03/2021   PPD Test 02/02/2014, 07/07/2015, 12/26/2016, 02/19/2018, 12/29/2019, 02/04/2021, 02/07/2022   Pneumococcal Polysaccharide-23 07/14/2008, 06/23/2014   Td 10/21/2009   Tdap 04/09/2020   Zoster Recombinat (Shingrix) 05/15/2022, 08/22/2022   Health Maintenance Due  Topic Date Due   Medicare Annual Wellness (AWV)  Never done   COVID-19 Vaccine (2 - Moderna series) 05/03/2020   COLONOSCOPY (Pts 45-40yr Insurance coverage will need to be confirmed)  12/11/2021   OPHTHALMOLOGY EXAM  06/17/2022   INFLUENZA VACCINE  06/20/2022   Diabetic kidney evaluation - Urine ACR  07/22/2022    Activities of Daily Living    10/05/2022   12:18 PM  In your present state of health, do you have any difficulty performing the following activities:  Hearing? 1  Vision? 1   Difficulty concentrating or making decisions? 0  Walking or climbing stairs? 1  Dressing or bathing? 0  Doing errands, shopping? 0  Preparing Food and eating ? N  Using the Toilet? N  In the past six months, have you accidently leaked urine? Y  Do you have problems with loss of bowel control? N  Managing your Medications? N  Managing your Finances? N  Housekeeping or managing your Housekeeping? N    Physical Exam  (optional), or other factors deemed appropriate based on the beneficiary's medical and social history and current clinical standards. General: Older African-American female in NAD. Chest: Clear to auscultation bilaterally CVS: Regular rate rhythm without gallops. Extremities: Trace lower extremity edema.  Advanced Directives: Does Patient Have a Medical Advance Directive?: No Would patient like information on creating a medical advance directive?: Yes (Inpatient - patient defers creating a medical advance directive at this time - Information given)    Assessment:    This is a routine wellness examination for this patient .   Vision/Hearing screen Has eye exam scheduled 12/2022 with Dr. GWendall Papatest: This was normal.  Dietary issues and exercise activities discussed:  Current Exercise Habits: The patient does not participate in regular exercise at present, Exercise limited by: orthopedic condition(s)   Goals      Blood Pressure < 140/90     HEMOGLOBIN A1C < 7.0      Depression Screen    10/05/2022   11:57 AM 05/15/2022    3:19 PM 05/03/2021   10:05 AM 04/09/2020    9:47 AM  PHQ 2/9 Scores  PHQ - 2 Score _0 PHQ- 9 Score _1 Fall Risk    10/05/2022   11:57 AM  Fall Risk   Falls in the past year? 0  Number falls in past yr: 0  Injury with Fall? 0  Risk for fall due to : No Fall Risks    Cognitive Function:    10/05/2022   12:05 PM 05/15/2022    3:22 PM  MMSE - Mini Mental State Exam  Orientation to time 3 5  Orientation  to Place 4 5  Registration 3 3  Attention/ Calculation 5 5  Recall 3 3  Language- name 2 objects 2 2  Language- repeat 1 1  Language- follow  3 step command 3 2  Language- read & follow direction 1 1  Write a sentence 1 1  Copy design 1 1  Total score 27 29        Patient Care Team: Ladell Pier, MD as PCP - General (Internal Medicine)     Plan:    1. Encounter for Medicare annual wellness exam   2. Advance directive discussed with patient Discussed advanced directive with her and the importance of having 1.  Went over what is a living will versus a healthcare power of attorney.  Patient given a packet to take home to review.  Advised that if she executes a living will or healthcare power of attorney, she should bring a copy for [place] to put in her records.  3. Hypertension associated with diabetes ([place]) Not at goal but repeat blood pressure today better.  Given that she is on several blood pressure medications and still not able to get her under control, I will refer her to advanced hypertension clinic. DASH diet discussed and encouraged. Encouraged her to check blood pressure at least twice a week and record the readings. - Ambulatory referral to Advanced Hypertension Clinic - [facility]  4. Urge incontinence She will continue using pads as needed.  5. Type 2 diabetes mellitus with peripheral neuropathy [facility]) Clinical pharmacist met with patient today to help her set up her continuous glucose monitor to [place] device.  6. Mild major depression ([place]) Patient does not feel that she needs to be on medication at this time or needs counseling.  7. Need for influenza vaccination - Flu Vaccine QUAD High Dose(Fluad)  I have personally reviewed and noted the following in the patient's chart:   Medical and social history Use of alcohol, tobacco or illicit drugs  Current medications and supplements Functional ability and status Nutritional status Physical  activity Advanced directives List of other physicians Hospitalizations, surgeries, and ER visits in previous 12 months Vitals Screenings to include cognitive, depression, and falls Referrals and appointments  In addition, I have reviewed and discussed with patient certain preventive protocols, quality metrics, and best practice recommendations. A written personalized care plan for preventive services as well as general preventive health recommendations were provided to patient.     Karle Plumber, MD 10/05/2022

## 2022-10-06 ENCOUNTER — Ambulatory Visit: Payer: Medicare Other | Admitting: Podiatry

## 2022-10-18 NOTE — Progress Notes (Signed)
[facility] Initial Assessment:    Date:  10/19/2022   ID:  Katherine [name], DOB March 06, 1956, MRN [id number]  PCP:  Ladell Pier, MD  Cardiologist:  None  Nephrologist:  Referring MD: Ladell Pier, MD   CC: Hypertension  History of Present Illness:    Katherine [name] is a 66 y.o. female with a hx of PAD, toe ulcer s/p amputation LT 1st toe, hypertension, hyperlipidemia, type 2 diabetes mellitus with neuropathy, retinopathy and possible gastroparesis, diabetic ketoacidoses, anemia, and depression, here to establish care in the [facility].   She previously saw Dr. Gwenlyn Found 02/2021. He did peripheral angiography 08/2017 that revealed an occluded left anterior tibial and posterior tibial artery with a patent peroneal. He was able to recanalize the anterior tibial down to her foot. Her last ABIs 04/2022 were normal bilaterally.  She saw her PCP Dr. Wynetta Emery 10/05/2022 and her blood pressure was 160/84. She was compliant with Coreg 6.25 mg BID, amlodipine 10 mg daily, hydralazine 100 mg TID, HCTZ 25 mg daily, and lisinopril 20 mg daily. She was not monitoring home blood pressures. Given her uncontrolled blood pressure on multiple agents she was referred to the [facility] for further evaluation and management.  Today, she states she is feeling sluggish. She felt worse last night. She wonder is she is developing a cold or suffering from allergies. At home her blood pressure has been running higher than in clinic today at 124/51. She would consider this to be too low. She suspects her BP cuff may be inaccurate due to higher readings even after taking her antihypertensives. Her main activity is walking around the house and doing chores.  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 She has not been formally exercising due to severe LE pain in her legs and feet. She attributes this to neuropathy. OTC pain medication has  not been effective. She was given tramadol which seems to help but does not resolve her pain. Lately she has been out of this. Additionally she complains of worse LE edema. In the past 2 days especially she has not been eating much, yet she has recently gained 2 lbs. She stays hydrated and does try to eat as she is diabetic. This is difficult because many foods are now unpalatable for her. She follows a combination of cooking meals at home and ordering out. Typically she monitors her salt intake. She is drinking iced coffee at least once, but slowly throughout the day. She does not consume alcohol. Her snoring seems to be worse when she is more fatigued and active in the day. She denies any palpitations, chest pain, shortness of breath, lightheadedness, headaches, syncope, orthopnea, or PND.  Past Medical History:  Diagnosis Date   Anemia    requiring transfusion   Boil of buttock ~ 2016   Depression    Diabetic neuropathy ([place])    Archie Endo 08/23/2017   DKA (diabetic ketoacidoses)    recent/notes 01/06/2015   Hyperlipidemia    Hypertension    Murmur 10/19/2022   Neuropathy    Peripheral vascular disease ([place])    Toe ulcer ([place])    left great toe/notes 08/23/2017   Type II diabetes mellitus ([place])     Past Surgical History:  Procedure Laterality Date   ABDOMINAL AORTOGRAM W/LOWER EXTREMITY N/A 04/06/2022   Procedure: ABDOMINAL AORTOGRAM W/LOWER EXTREMITY;  Surgeon: Marty Heck, MD;  Location: [place] [facility];  Service: Cardiovascular;  Laterality: N/A;   AMPUTATION TOE Left 10/26/2017   Procedure: AMPUTATION PARTIAL RAY LEFT FOOT AND IRRIGATION/DEBRIDEMENT;  Surgeon: Edrick Kins, DPM;  Location: [place];  Service: Podiatry;  Laterality: Left;  AMPUTATION PARTIAL RAY LEFT FOOT AND IRRIGATION/DEBRIDEMENT   CHOLECYSTECTOMY  01/24/2003   Archie Endo 04/04/2011   COLONOSCOPY     INCISION AND DRAINAGE ABSCESS  03/17/2012   "right but; left lower abdoment"/notes 03/17/2012   LOWER EXTREMITY  INTERVENTION N/A 09/10/2017   Procedure: LOWER EXTREMITY INTERVENTION;  Surgeon: Lorretta Harp, MD;  Location: [place] [facility];  Service: Cardiovascular;  Laterality: N/A;   PERIPHERAL VASCULAR ATHERECTOMY  09/10/2017   Procedure: PERIPHERAL VASCULAR ATHERECTOMY;  Surgeon: Lorretta Harp, MD;  Location: [place] [facility];  Service: Cardiovascular;;  left AT   PERIPHERAL VASCULAR BALLOON ANGIOPLASTY Left 04/06/2022   Procedure: PERIPHERAL VASCULAR BALLOON ANGIOPLASTY;  Surgeon: Marty Heck, MD;  Location: [place] [facility];  Service: Cardiovascular;  Laterality: Left;    Current Medications: Current Meds  Medication Sig   amLODipine (NORVASC) 10 MG tablet Take 1 tablet (10 mg total) by mouth daily.   aspirin EC 81 MG tablet Take 1 tablet (81 mg total) by mouth daily. Swallow whole.   atorvastatin (LIPITOR) 80 MG tablet Take 1 tablet (80 mg total) by mouth daily at 6 PM. (Patient taking differently: Take 80 mg by mouth at bedtime.)   BAQSIMI ONE PACK 3 MG/DOSE POWD Place 1 spray into the nose as needed (Low blood sugar).   Carboxymethylcellulose Sodium (EYE DROPS OP) Place 1 drop into both eyes daily.   carvedilol (COREG) 6.25 MG tablet Take 1 tablet (6.25 mg total) by mouth 2 (two) times daily with a meal. (Patient taking differently: Take 6.25 mg by mouth daily.)   clopidogrel (PLAVIX) 75 MG tablet TAKE 1 TABLET BY MOUTH EVERY DAY WITH BREAKFAST   Continuous Blood Gluc Receiver (FREESTYLE LIBRE 2 READER) DEVI Use as directed 3 times daily   Continuous Blood Gluc Receiver (FREESTYLE LIBRE 2 READER) DEVI Scan as needed for continuous glucose monitoring.   Continuous Blood Gluc Sensor (FREESTYLE LIBRE 2 SENSOR) MISC Change sensor every 2 weeks.   Continuous Blood Gluc Sensor (FREESTYLE LIBRE 2 SENSOR) MISC Inject 1 sensor to the skin every 14 days for continuous glucose monitoring.   gabapentin (NEURONTIN) 600 MG tablet Take 1 tablet (600 mg total) by mouth 2 (two) times daily.  (Patient taking differently: Take 650 mg by mouth 2 (two) times daily.)   glucose blood (ONETOUCH VERIO) test strip 1 each by Misc.(Non-Drug; Combo Route) route 4 times daily before meals and nightly.   hydrALAZINE (APRESOLINE) 100 MG tablet Take 1 tablet (100 mg total) by mouth 3 (three) times daily.   hydrochlorothiazide (HYDRODIURIL) 25 MG tablet Take 1 tablet (25 mg total) by mouth daily.   insulin aspart (NOVOLOG) 100 UNIT/ML FlexPen Inject 10-17 units into the skin 3 times daily before meals. (Patient taking differently: Inject 10 Units into the skin 3 (three) times daily with meals.)   Lancets (ONETOUCH DELICA PLUS [id number]) MISC Use 4 times daily before meals and nightly.   lisinopril (ZESTRIL) 20 MG tablet Take 1 tablet (20 mg total) by mouth daily.   Multiple Vitamins-Minerals (MULTIVITAMIN ADULT) CHEW Chew 2 tablets by mouth daily.   mupirocin cream (BACTROBAN) 2 % Apply topically daily. Apply to left foot wound and cover with dry dressing once daily   TOUJEO SOLOSTAR 300 UNIT/ML Solostar Pen Inject 80 Units into the skin  nightly.   traMADol (ULTRAM) 50 MG tablet Take 1 tablet (50 mg total) by mouth every 12 (twelve) hours as needed.   TRUEPLUS PEN NEEDLES 31G X 5 MM MISC See admin instructions.   VITAMIN D PO Take 2 tablets by mouth at bedtime.   [DISCONTINUED] Blood Pressure KIT Check blood pressure twice a day     Allergies:   Other and Metformin and related   Social History   Socioeconomic History   Marital status: Married    Spouse name: Not on file   Number of children: Not on file   Years of education: Not on file   Highest education level: Not on file  Occupational History   Not on file  Tobacco Use   Smoking status: Never    Passive exposure: Never   Smokeless tobacco: Never  Vaping Use   Vaping Use: Never used  Substance and Sexual Activity   Alcohol use: No   Drug use: No   Sexual activity: Never  Other Topics Concern   Not on file  Social History  Narrative   Not on file   Social Determinants of Health   Financial Resource Strain: Low Risk  (10/05/2022)   Overall Financial Resource Strain (CARDIA)    Difficulty of Paying Living Expenses: Not hard at all  Food Insecurity: Food Insecurity Present (10/05/2022)   Hunger Vital Sign    Worried About [place] in the Last Year: Never true    [place] in the Last Year: Sometimes true  Transportation Needs: No Transportation Needs (10/05/2022)   PRAPARE - Hydrologist (Medical): No    Lack of Transportation (Non-Medical): No  Physical Activity: Inactive (10/05/2022)   Exercise Vital Sign    Days of Exercise per Week: 0 days    Minutes of Exercise per Session: 0 min  Stress: No Stress Concern Present (10/05/2022)   [place]    Feeling of Stress : Only a little  Social Connections: Socially Integrated (10/05/2022)   Social Connection and Isolation Panel [NHANES]    Frequency of Communication with Friends and Family: More than three times a week    Frequency of Social Gatherings with Friends and Family: Once a week    Attends Religious Services: More than 4 times per year    Active Member of [employer] or Organizations: Yes    Attends Music therapist: More than 4 times per year    Marital Status: Married     Family History: The patient's family history includes Cancer in her maternal grandmother; Diabetes in her father, paternal aunt, paternal grandfather, and paternal uncle; Hypertension in her father, maternal grandfather, mother, paternal aunt, and paternal uncle; Kidney disease in her mother. There is no history of Colon cancer or Breast cancer.  ROS:   Please see the history of present illness.    (+) Fatigue/Malaise (+) BLE pain and edema (+) Snoring All other systems reviewed and are negative.  EKGs/Labs/Other Studies Reviewed:    LE Venous Doppler   08/07/2022: Summary:  Left:  - No evidence of deep vein thrombosis seen in the left lower extremity,  from the common femoral through the popliteal veins.  - No evidence of superficial venous thrombosis in the left lower  extremity.  - No evidence of superficial venous reflux seen in the left short  saphenous vein.  - Venous reflux is noted in the left greater  saphenous vein in the thigh.   ABI  05/16/2022: Right ABIs appear essentially unchanged. Left ABIs appear increased  compared to prior study on 04/01/2022.    Summary:  Right: Resting right ankle-brachial index is within normal range. No  evidence of significant right lower extremity arterial disease. The right  toe-brachial index is abnormal.   Left: Resting left ankle-brachial index is within normal range. No  evidence of significant left lower extremity arterial disease.   LE Arterial Duplex Study  05/16/2022: Summary:  Left: Widely patent left lower extremity arterial system.  Biphasic waveforms observed in the mid and distal anterior tibial artery.   Abdominal Aortogram with Peripheral Vascular Angioplasty  04/06/2022: Pre-operative Diagnosis: Left fifth toe osteomyelitis with known peripheral arterial disease Post-operative diagnosis:  Same Surgeon:  Marty Heck, MD Procedure Performed: 1.  Ultrasound-guided access right common femoral artery 2.  Aortogram with catheter selection of aorta 3.  Left lower extremity arteriogram with selection of third order branches 4.  Left anterior tibial artery angioplasty (3 mm x 100 mm Sterling) 5.  Mynx closure of the right common femoral artery 6.  57 minutes of monitored moderate conscious sedation time  Findings:  Aortogram showed patent renal arteries bilaterally with no flow-limiting stenosis in the aortoiliac segment.  The left common femoral, profunda, SFA, popliteal arteries are all widely patent.  She has posterior tibial artery occlusion.  Dominant runoff is  through the anterior tibial and peroneal.  The peroneal is patent but diseased distally at the ankle and it splits into two branches.  The anterior tibial had two moderate stenosis >60% in the midportion of the vessel.  Ultimately the anterior tibial artery stenosis was crossed antegrade and angioplastied with a 3 mm Sterling.  No significant residual stenosis.  Preserved runoff and optimized from a vascular surgery standpoint.  Plan: Continue Plavix statin.  We will arrange follow-up in 1 month with arterial duplex.  Optimized for toe amputation.   EKG:  EKG is personally reviewed. 10/19/2022: Sinus rhythm. Rate 73 bpm.  Recent Labs: 03/31/2022: Magnesium 2.1 04/24/2022: ALT 8; BUN 18; Creat 0.95; Hemoglobin 12.0; Platelets 211; Potassium 3.8; Sodium 142   Recent Lipid Panel    Component Value Date/Time   CHOL 175 02/23/2021 1025   TRIG 106 02/23/2021 1025   HDL 56 02/23/2021 1025   CHOLHDL 3.1 02/23/2021 1025   CHOLHDL 3.8 09/19/2016 1452   VLDL 36 (H) 09/19/2016 1452   LDLCALC 100 (H) 02/23/2021 1025    Physical Exam:    VS:  BP (!) 126/52 (BP Location: Right Arm, Patient Position: Sitting, Cuff Size: Large)   Pulse 73   Ht _0  (1.803 m)   Wt 218 lb 14.4 oz (99.3 kg)   BMI 30.53 kg/m  , BMI Body mass index is 30.53 kg/m. GENERAL:  Well appearing HEENT: Pupils equal round and reactive, fundi not visualized, oral mucosa unremarkable NECK:  No jugular venous distention, waveform within normal limits, carotid upstroke brisk and symmetric, no bruits, no thyromegaly LUNGS:  Clear to auscultation bilaterally HEART:  RRR.  PMI not displaced or sustained,S1 and S2 within normal limits, no S3, no S4, no clicks, no rubs, II/VI systolic murmur at the LUSB ABD:  Flat, positive bowel sounds normal in frequency in pitch, no bruits, no rebound, no guarding, no midline pulsatile mass, no hepatomegaly, no splenomegaly EXT:  2 plus pulses throughout, 2+ BLE edema, no cyanosis no clubbing SKIN:   No rashes no nodules NEURO:  Cranial nerves  II through XII grossly intact, motor grossly intact throughout [facility]:  Cognitively intact, oriented to person place and time  ASSESSMENT/PLAN:    Essential hypertension Her blood pressure in the office is well-controlled today.  It has been elevated at most appointments.  She hasn't been getting much exercise due to pain.  She doesn't check her BP at home because she doesn't think that her machine is accurate.  She was provided with a prescription and information about where to get a BP cuff.  She will track her BP at home and bring to follow-up.  Continue lisinopril, carvedilol, and amlodipine.  We will check TSH, renin, aldosterone, and cortisol.  She will also get a sleep study.  Blood pressure goal is less than 130/80.  Renal artery stenosis (HCC) Checking renal artery Dopplers as above.  She had mild renal artery stenosis in 2020.  Critical lower limb ischemia PAD status post left anterior tibial artery angioplasty.  She has pain in her legs but ABIs were within normal limits earlier this year.  I suspect this is due to her known peripheral neuropathy.  She is scheduled to see neurology in a couple weeks and ran out of her tramadol.  We will refill a 2-week supply.  Dyslipidemia LDL goal is less than 70.  Continue atorvastatin.  Murmur Murmur noted on exam and she has lower extremity edema.  We will get an echocardiogram to better assess.   Screening for Secondary Hypertension:     10/19/2022   11:23 AM  Causes  Drugs/Herbals Screened     - Comments limits salt.  Does eat out some. Daily coffee x2.No EtOH.  No NSAIDS  Renovascular HTN Screened     - Comments check renal artery Doppler  Sleep Apnea Screened     - Comments Snores, AM fatigue, daytime somnolence.  Check sleep study  Thyroid Disease Screened     - Comments Check TSh  Hyperaldosteronism Screened     - Comments Check renin and aldosterone  Pheochromocytoma N/A   Cushing's Syndrome Screened     - Comments check AM cortisol    Relevant Labs/Studies:    Latest Ref Rng & Units 04/24/2022   11:52 AM 04/06/2022    8:07 AM 04/06/2022    7:17 AM  Basic Labs  Sodium 135 - 146 mmol/L 142  139  139   Potassium 3.5 - 5.3 mmol/L 3.8  3.5  5.9   Creatinine 0.50 - 1.05 mg/dL 0.95  0.60  0.80        Latest Ref Rng & Units 08/28/2018    2:24 PM 07/12/2017    4:55 PM  Thyroid   TSH 0.450 - 4.500 uIU/mL 1.370  2.940                 10/19/2022   11:46 AM  Renovascular   Renal Artery [place] Completed Yes    Disposition:    FU with APP/PharmD in 1-2 months.   Medication Adjustments/Labs and Tests Ordered: Current medicines are reviewed at length with the patient today.  Concerns regarding medicines are outlined above.   Orders Placed This Encounter  Procedures   TSH   Cortisol   Aldosterone + renin activity w/ ratio   EKG 12-Lead   VAS US RENAL ARTERY DUPLEX   Meds ordered this encounter  Medications   traMADol (ULTRAM) 50 MG tablet    Sig: Take 1 tablet (50 mg total) by mouth every 12 (twelve) hours as needed.  Dispense:  14 tablet    Refill:  1   DISCONTD: Blood Pressure KIT    Sig: Check blood pressure twice a day    Dispense:  1 kit    Refill:  0   Blood Pressure KIT    Sig: Check blood pressure twice a day    Dispense:  1 kit    Refill:  0   I,Mathew Stumpf,acting as a scribe for Skeet Latch, MD.,have documented all relevant documentation on the behalf of Skeet Latch, MD,as directed by  Skeet Latch, MD while in the presence of Skeet Latch, MD.  I, [place] Oval Linsey, MD have reviewed all documentation for this visit.  The documentation of the exam, diagnosis, procedures, and orders on 10/19/2022 are all accurate and complete.   Signed, Skeet Latch, MD  10/19/2022 1:00 PM    [place]

## 2022-10-19 ENCOUNTER — Ambulatory Visit (INDEPENDENT_AMBULATORY_CARE_PROVIDER_SITE_OTHER): Payer: Medicare Other | Admitting: Cardiovascular Disease

## 2022-10-19 ENCOUNTER — Encounter (HOSPITAL_BASED_OUTPATIENT_CLINIC_OR_DEPARTMENT_OTHER): Payer: Self-pay | Admitting: Cardiovascular Disease

## 2022-10-19 ENCOUNTER — Telehealth (HOSPITAL_BASED_OUTPATIENT_CLINIC_OR_DEPARTMENT_OTHER): Payer: Self-pay | Admitting: *Deleted

## 2022-10-19 ENCOUNTER — Encounter (HOSPITAL_BASED_OUTPATIENT_CLINIC_OR_DEPARTMENT_OTHER): Payer: Self-pay | Admitting: *Deleted

## 2022-10-19 VITALS — BP 126/52 | HR 73 | Ht 71.0 in | Wt 218.9 lb

## 2022-10-19 DIAGNOSIS — R011 Cardiac murmur, unspecified: Secondary | ICD-10-CM

## 2022-10-19 DIAGNOSIS — I70229 Atherosclerosis of native arteries of extremities with rest pain, unspecified extremity: Secondary | ICD-10-CM

## 2022-10-19 DIAGNOSIS — E785 Hyperlipidemia, unspecified: Secondary | ICD-10-CM | POA: Diagnosis not present

## 2022-10-19 DIAGNOSIS — I10 Essential (primary) hypertension: Secondary | ICD-10-CM | POA: Diagnosis not present

## 2022-10-19 DIAGNOSIS — I701 Atherosclerosis of renal artery: Secondary | ICD-10-CM

## 2022-10-19 HISTORY — DX: Cardiac murmur, unspecified: R01.1

## 2022-10-19 MED ORDER — BLOOD PRESSURE KIT: PACK | 0 refills | Status: DC

## 2022-10-19 MED ORDER — TRAMADOL HCL 50 MG PO TABS: 50.0000 mg | ORAL_TABLET | Freq: Two times a day (BID) | ORAL | 1 refills | Status: DC | PRN

## 2022-10-19 NOTE — Assessment & Plan Note (Signed)
Checking renal artery Dopplers as above.  She had mild renal artery stenosis in 2020.

## 2022-10-19 NOTE — Addendum Note (Signed)
Addended by: Alvina Filbert B on: 10/19/2022 02:19 PM   Modules accepted: Orders

## 2022-10-19 NOTE — Assessment & Plan Note (Signed)
PAD status post left anterior tibial artery angioplasty.  She has pain in her legs but ABIs were within normal limits earlier this year.  I suspect this is due to her known peripheral neuropathy.  She is scheduled to see neurology in a couple weeks and ran out of her tramadol.  We will refill a 2-week supply.

## 2022-10-19 NOTE — Telephone Encounter (Addendum)
After visit today Dr Oval Linsey wanted to get Echo for patient Called, unable to leave message secondary to VM being full Order placed   Mychart message sent to patient

## 2022-10-19 NOTE — Assessment & Plan Note (Signed)
LDL goal is less than 70.  Continue atorvastatin.

## 2022-10-19 NOTE — Patient Instructions (Addendum)
Medication Instructions:  START TRAMADOL 50 MG EVERY 12 HOURS AS NEEDED FOR PAIN   Labwork: RENIN/ALDOSTERONE/CORTISOL/TSH SOON FIRST THING IN THE MORNING    Testing/Procedures: Your physician has requested that you have a renal artery duplex. During this test, an ultrasound is used to evaluate blood flow to the kidneys. Allow one hour for this exam. Do not eat after midnight the day before and avoid carbonated beverages. Take your medications as you usually do.   Follow-Up: 12/07/2022 10:55 AM WITH CAITLIN W NP    Special Instructions:   MONITOR YOUR BLOOD PRESSURE TWICE A DAY, LOG IN THE BOOK PROVIDED. BRING THE BOOK AND YOUR BLOOD PRESSURE MACHINE TO YOUR FOLLOW UP    DASH Eating Plan DASH stands for "Dietary Approaches to Stop Hypertension." The DASH eating plan is a healthy eating plan that has been shown to reduce high blood pressure (hypertension). It may also reduce your risk for type 2 diabetes, heart disease, and stroke. The DASH eating plan may also help with weight loss. What are tips for following this plan?  General guidelines Avoid eating more than 2,300 mg (milligrams) of salt (sodium) a day. If you have hypertension, you may need to reduce your sodium intake to 1,500 mg a day. Limit alcohol intake to no more than 1 drink a day for nonpregnant women and 2 drinks a day for men. One drink equals 12 oz of beer, 5 oz of wine, or 1 oz of hard liquor. Work with your health care provider to maintain a healthy body weight or to lose weight. Ask what an ideal weight is for you. Get at least 30 minutes of exercise that causes your heart to beat faster (aerobic exercise) most days of the week. Activities may include walking, swimming, or biking. Work with your health care provider or diet and nutrition specialist (dietitian) to adjust your eating plan to your individual calorie needs. Reading food labels  Check food labels for the amount of sodium per serving. Choose foods with less  than 5 percent of the Daily Value of sodium. Generally, foods with less than 300 mg of sodium per serving fit into this eating plan. To find whole grains, look for the word "whole" as the first word in the ingredient list. Shopping Buy products labeled as "low-sodium" or "no salt added." Buy fresh foods. Avoid canned foods and premade or frozen meals. Cooking Avoid adding salt when cooking. Use salt-free seasonings or herbs instead of table salt or sea salt. Check with your health care provider or pharmacist before using salt substitutes. Do not fry foods. Cook foods using healthy methods such as baking, boiling, grilling, and broiling instead. Cook with heart-healthy oils, such as olive, canola, soybean, or sunflower oil. Meal planning Eat a balanced diet that includes: 5 or more servings of fruits and vegetables each day. At each meal, try to fill half of your plate with fruits and vegetables. Up to 6-8 servings of whole grains each day. Less than 6 oz of lean meat, poultry, or fish each day. A 3-oz serving of meat is about the same size as a deck of cards. One egg equals 1 oz. 2 servings of low-fat dairy each day. A serving of nuts, seeds, or beans 5 times each week. Heart-healthy fats. Healthy fats called Omega-3 fatty acids are found in foods such as flaxseeds and coldwater fish, like sardines, salmon, and mackerel. Limit how much you eat of the following: Canned or prepackaged foods. Food that is high in trans  fat, such as fried foods. Food that is high in saturated fat, such as fatty meat. Sweets, desserts, sugary drinks, and other foods with added sugar. Full-fat dairy products. Do not salt foods before eating. Try to eat at least 2 vegetarian meals each week. Eat more home-cooked food and less restaurant, buffet, and fast food. When eating at a restaurant, ask that your food be prepared with less salt or no salt, if possible. What foods are recommended? The items listed may not  be a complete list. Talk with your dietitian about what dietary choices are best for you. Grains Whole-grain or whole-wheat bread. Whole-grain or whole-wheat pasta. Brown rice. [place]. Bulgur. Whole-grain and low-sodium cereals. Pita bread. Low-fat, low-sodium crackers. Whole-wheat flour tortillas. Vegetables Fresh or frozen vegetables (raw, steamed, roasted, or grilled). Low-sodium or reduced-sodium tomato and vegetable juice. Low-sodium or reduced-sodium tomato sauce and tomato paste. Low-sodium or reduced-sodium canned vegetables. Fruits All fresh, dried, or frozen fruit. Canned fruit in natural juice (without added sugar). Meat and other protein foods Skinless chicken or [place]. Ground chicken or [place]. Pork with fat trimmed off. Fish and seafood. Egg whites. Dried beans, peas, or lentils. Unsalted nuts, nut butters, and seeds. Unsalted canned beans. Lean cuts of beef with fat trimmed off. Low-sodium, lean deli meat. Dairy Low-fat (1%) or fat-free (skim) milk. Fat-free, low-fat, or reduced-fat cheeses. Nonfat, low-sodium ricotta or cottage cheese. Low-fat or nonfat yogurt. Low-fat, low-sodium cheese. Fats and oils Soft margarine without trans fats. Vegetable oil. Low-fat, reduced-fat, or light mayonnaise and salad dressings (reduced-sodium). Canola, safflower, olive, soybean, and sunflower oils. Avocado. Seasoning and other foods Herbs. Spices. Seasoning mixes without salt. Unsalted popcorn and pretzels. Fat-free sweets. What foods are not recommended? The items listed may not be a complete list. Talk with your dietitian about what dietary choices are best for you. Grains Baked goods made with fat, such as croissants, muffins, or some breads. Dry pasta or rice meal packs. Vegetables Creamed or fried vegetables. Vegetables in a cheese sauce. Regular canned vegetables (not low-sodium or reduced-sodium). Regular canned tomato sauce and paste (not low-sodium or reduced-sodium). Regular  tomato and vegetable juice (not low-sodium or reduced-sodium). Angie Fava. Olives. Fruits Canned fruit in a light or heavy syrup. Fried fruit. Fruit in cream or butter sauce. Meat and other protein foods Fatty cuts of meat. Ribs. Fried meat. Berniece Salines. Sausage. Bologna and other processed lunch meats. Salami. Fatback. Hotdogs. Bratwurst. Salted nuts and seeds. Canned beans with added salt. Canned or smoked fish. Whole eggs or egg yolks. Chicken or [place] with skin. Dairy Whole or 2% milk, cream, and half-and-half. Whole or full-fat cream cheese. Whole-fat or sweetened yogurt. Full-fat cheese. Nondairy creamers. Whipped toppings. Processed cheese and cheese spreads. Fats and oils Butter. Stick margarine. Lard. Shortening. Ghee. Bacon fat. Tropical oils, such as coconut, palm kernel, or palm oil. Seasoning and other foods Salted popcorn and pretzels. Onion salt, garlic salt, seasoned salt, table salt, and sea salt. Worcestershire sauce. Tartar sauce. Barbecue sauce. Teriyaki sauce. Soy sauce, including reduced-sodium. Steak sauce. Canned and packaged gravies. Fish sauce. Oyster sauce. Cocktail sauce. Horseradish that you find on the shelf. Ketchup. Mustard. Meat flavorings and tenderizers. Bouillon cubes. Hot sauce and Tabasco sauce. Premade or packaged marinades. Premade or packaged taco seasonings. Relishes. Regular salad dressings. Where to find more information: National Heart, Lung, and [place]: https://wilson-eaton.com/ American Heart Association: www.heart.org Summary The DASH eating plan is a healthy eating plan that has been shown to reduce high blood pressure (hypertension). It may  also reduce your risk for type 2 diabetes, heart disease, and stroke. With the DASH eating plan, you should limit salt (sodium) intake to 2,300 mg a day. If you have hypertension, you may need to reduce your sodium intake to 1,500 mg a day. When on the DASH eating plan, aim to eat more fresh fruits and vegetables, whole  grains, lean proteins, low-fat dairy, and heart-healthy fats. Work with your health care provider or diet and nutrition specialist (dietitian) to adjust your eating plan to your individual calorie needs. This information is not intended to replace advice given to you by your health care provider. Make sure you discuss any questions you have with your health care provider. Document Released: 10/26/2011 Document Revised: 10/19/2017 Document Reviewed: 10/30/2016 Elsevier Patient Education  2020 [employer].

## 2022-10-19 NOTE — Assessment & Plan Note (Signed)
Murmur noted on exam and she has lower extremity edema.  We will get an echocardiogram to better assess.

## 2022-10-19 NOTE — Assessment & Plan Note (Addendum)
Her blood pressure in the office is well-controlled today.  It has been elevated at most appointments.  She hasn't been getting much exercise due to pain.  She doesn't check her BP at home because she doesn't think that her machine is accurate.  She was provided with a prescription and information about where to get a BP cuff.  She will track her BP at home and bring to follow-up.  Continue lisinopril, carvedilol, and amlodipine.  We will check TSH, renin, aldosterone, and cortisol.  She will also get a sleep study.  Blood pressure goal is less than 130/80.

## 2022-10-20 ENCOUNTER — Telehealth (HOSPITAL_BASED_OUTPATIENT_CLINIC_OR_DEPARTMENT_OTHER): Payer: Self-pay | Admitting: Cardiovascular Disease

## 2022-10-20 NOTE — Telephone Encounter (Signed)
Called to discuss scheduling the echocardiogram ordered by Dr. Jorge Mandril answer and no voice mail

## 2022-10-24 NOTE — Telephone Encounter (Signed)
Called to discuss scheduling the Echocardiogram ordered by Dr. Jorge Mandril answer and no voice mail

## 2022-10-31 DIAGNOSIS — I10 Essential (primary) hypertension: Secondary | ICD-10-CM | POA: Diagnosis not present

## 2022-10-31 DIAGNOSIS — Z79891 Long term (current) use of opiate analgesic: Secondary | ICD-10-CM | POA: Diagnosis not present

## 2022-10-31 DIAGNOSIS — M79671 Pain in right foot: Secondary | ICD-10-CM | POA: Diagnosis not present

## 2022-11-02 ENCOUNTER — Encounter (HOSPITAL_BASED_OUTPATIENT_CLINIC_OR_DEPARTMENT_OTHER): Payer: Self-pay | Admitting: *Deleted

## 2022-11-07 ENCOUNTER — Ambulatory Visit (INDEPENDENT_AMBULATORY_CARE_PROVIDER_SITE_OTHER)
Admission: RE | Admit: 2022-11-07 | Discharge: 2022-11-07 | Disposition: A | Discharge status: Home / Self Care | Payer: Medicare Other | Source: Ambulatory Visit | Attending: Vascular Surgery | Admitting: Vascular Surgery

## 2022-11-07 ENCOUNTER — Ambulatory Visit (INDEPENDENT_AMBULATORY_CARE_PROVIDER_SITE_OTHER): Payer: Medicare Other | Admitting: Vascular Surgery

## 2022-11-07 ENCOUNTER — Encounter: Payer: Self-pay | Admitting: Vascular Surgery

## 2022-11-07 ENCOUNTER — Ambulatory Visit (HOSPITAL_COMMUNITY)
Admission: RE | Admit: 2022-11-07 | Discharge: 2022-11-07 | Disposition: A | Discharge status: Home / Self Care | Payer: Medicare Other | Source: Ambulatory Visit | Attending: Vascular Surgery | Admitting: Vascular Surgery

## 2022-11-07 VITALS — BP 189/81 | HR 63 | Temp 97.2°F | Resp 16 | Ht 72.0 in | Wt 215.0 lb

## 2022-11-07 DIAGNOSIS — I739 Peripheral vascular disease, unspecified: Secondary | ICD-10-CM | POA: Insufficient documentation

## 2022-11-07 DIAGNOSIS — I70229 Atherosclerosis of native arteries of extremities with rest pain, unspecified extremity: Secondary | ICD-10-CM | POA: Diagnosis not present

## 2022-11-07 NOTE — Progress Notes (Signed)
Patient name: Katherine [name] MRN: [id number] DOB: 01-28-1956 Sex: female  REASON FOR CONSULT: 3 month follow-up PAD  HPI: Katherine [name] is a 66 y.o. female, that presents for 58-monthfollow-up of her PAD.  She previously had a left anterior tibial angioplasty on 04/06/2022 for critical limb ischemia with tissue loss (left 5th toe osteomyelitis).  Ultimately her osteomyelitis healed after toe debridement with podiatry in the office.  No new wounds today.  She is taking aspirin Plavix statin.  Her main complaint is leg swelling.  She was put in knee-high compression stockings on the last visit.  Past Medical History:  Diagnosis Date   Anemia    requiring transfusion   Boil of buttock ~ 2016   Depression    Diabetic [place] ([place]    /Archie Endo10/02/2017   DKA (diabetic ketoacidoses)    recent/notes 01/06/2015   Hyperlipidemia    Hypertension    Murmur 10/19/2022   Neuropathy    Peripheral vascular disease ([facility]    Toe ulcer ([facility]    left great toe/notes 08/23/2017   [place] II diabetes mellitus [place]     Past Surgical History:  Procedure Laterality Date   ABDOMINAL AORTOGRAM W/LOWER EXTREMITY N/A 04/06/2022   Procedure: ABDOMINAL AORTOGRAM W/LOWER EXTREMITY;  Surgeon: CMarty Heck MD;  [facility];  Service: Cardiovascular;  Laterality: N/A;   AMPUTATION TOE Left 10/26/2017   Procedure: AMPUTATION PARTIAL RAY LEFT FOOT AND IRRIGATION/DEBRIDEMENT;  Surgeon: EEdrick Kins DPM;  [facility]  Service: Podiatry;  Laterality: Left;  AMPUTATION PARTIAL RAY LEFT FOOT AND IRRIGATION/DEBRIDEMENT   CHOLECYSTECTOMY  01/24/2003   /Archie Endo5/15/2012   COLONOSCOPY     INCISION AND DRAINAGE ABSCESS  03/17/2012   "right but; left lower abdoment"/notes 03/17/2012   LOWER EXTREMITY INTERVENTION N/A 09/10/2017   Procedure: LOWER EXTREMITY INTERVENTION;  Surgeon: BLorretta Harp MD;  [facility];  Service: Cardiovascular;  Laterality: N/A;   PERIPHERAL  VASCULAR ATHERECTOMY  09/10/2017   Procedure: PERIPHERAL VASCULAR ATHERECTOMY;  Surgeon: BLorretta Harp MD;  [facility];  Service: Cardiovascular;;  left AT   PERIPHERAL VASCULAR BALLOON ANGIOPLASTY Left 04/06/2022   Procedure: PERIPHERAL VASCULAR BALLOON ANGIOPLASTY;  Surgeon: CMarty Heck MD;  [facility];  Service: Cardiovascular;  Laterality: Left;    Family History  Problem Relation Age of Onset   Kidney disease Mother    Hypertension Mother    Diabetes Father    Hypertension Father    Hypertension Paternal Aunt    Diabetes Paternal Aunt    Hypertension Paternal Uncle    Diabetes Paternal Uncle    Cancer Maternal Grandmother    Hypertension Maternal Grandfather    Diabetes Paternal Grandfather    Colon cancer Neg Hx    Breast cancer Neg Hx     SOCIAL HISTORY: Social History   Socioeconomic History   Marital status: Married    Spouse name: Not on file   Number of children: Not on file   Years of education: Not on file   Highest education level: Not on file  Occupational History   Not on file  Tobacco Use   Smoking status: Never    Passive exposure: Never   Smokeless tobacco: Never  Vaping Use   Vaping Use: Never used  Substance and Sexual Activity   Alcohol use: No   Drug use: No   Sexual activity: Never  Other Topics Concern  Not on file  Social History Narrative   Not on file   Social Determinants of Health   Financial Resource Strain: Low Risk  (10/05/2022)   Overall Financial Resource Strain (CARDIA)    Difficulty of Paying Living Expenses: Not hard at all  Food Insecurity: Food Insecurity Present (10/05/2022)   Hunger Vital Sign    Worried About Running Out of Food in the Last Year: Never true    Ran Out of Food in the Last Year: Sometimes true  Transportation Needs: No Transportation Needs (10/05/2022)   PRAPARE - Hydrologist (Medical): No    Lack of Transportation  (Non-Medical): No  Physical Activity: Inactive (10/05/2022)   Exercise Vital Sign    Days of Exercise per Week: 0 days    Minutes of Exercise per Session: 0 min  Stress: No Stress Concern Present (10/05/2022)   [place]    Feeling of Stress : Only a little  Social Connections: Socially Integrated (10/05/2022)   Social Connection and Isolation Panel [NHANES]    Frequency of Communication with Friends and Family: More than three times a week    Frequency of Social Gatherings with Friends and Family: Once a week    Attends Religious Services: More than 4 times per year    Active Member of [employer] or Organizations: Yes    Attends Music therapist: More than 4 times per year    Marital Status: Married  Human resources officer Violence: Not At Risk (10/05/2022)   Humiliation, Afraid, Rape, and Kick questionnaire    Fear of Current or Ex-Partner: No    Emotionally Abused: No    Physically Abused: No    Sexually Abused: No    Allergies  Allergen Reactions   Other Swelling    Seaweed= swelling on arms, hands and face   Metformin And Related Nausea Only    Current Outpatient Medications  Medication Sig Dispense Refill   amLODipine (NORVASC) 10 MG tablet Take 1 tablet (10 mg total) by mouth daily. 30 tablet 6   aspirin EC 81 MG tablet Take 1 tablet (81 mg total) by mouth daily. Swallow whole. 150 tablet 2   atorvastatin (LIPITOR) 80 MG tablet Take 1 tablet (80 mg total) by mouth daily at 6 PM. (Patient taking differently: Take 80 mg by mouth at bedtime.) 30 tablet 6   Blood Pressure KIT Check blood pressure twice a day 1 kit 0   Carboxymethylcellulose Sodium (EYE DROPS OP) Place 1 drop into both eyes daily.     carvedilol (COREG) 6.25 MG tablet Take 1 tablet (6.25 mg total) by mouth 2 (two) times daily with a meal. (Patient taking differently: Take 6.25 mg by mouth daily.) 60 tablet 6   clopidogrel (PLAVIX) 75 MG  tablet TAKE 1 TABLET BY MOUTH EVERY DAY WITH BREAKFAST 90 tablet 3   Continuous Blood Gluc Receiver (FREESTYLE LIBRE 2 READER) DEVI Use as directed 3 times daily 1 each 12   Continuous Blood Gluc Receiver (FREESTYLE LIBRE 2 READER) DEVI Scan as needed for continuous glucose monitoring. 1 each 0   Continuous Blood Gluc Sensor (FREESTYLE LIBRE 2 SENSOR) MISC Change sensor every 2 weeks. 2 each 12   Continuous Blood Gluc Sensor (FREESTYLE LIBRE 2 SENSOR) MISC Inject 1 sensor to the skin every 14 days for continuous glucose monitoring. 2 each 11   gabapentin (NEURONTIN) 600 MG tablet Take 1 tablet (600 mg total) by mouth 2 (  two) times daily. (Patient taking differently: Take 650 mg by mouth 2 (two) times daily.) 60 tablet 6   glucose blood (ONETOUCH VERIO) test strip 1 each by Misc.(Non-Drug; Combo Route) route 4 times daily before meals and nightly. 400 each 3   hydrALAZINE (APRESOLINE) 100 MG tablet Take 1 tablet (100 mg total) by mouth 3 (three) times daily. 90 tablet 6   hydrochlorothiazide (HYDRODIURIL) 25 MG tablet Take 1 tablet (25 mg total) by mouth daily. 30 tablet 6   insulin aspart (NOVOLOG) 100 UNIT/ML FlexPen Inject 10-17 units into the skin 3 times daily before meals. (Patient taking differently: Inject 10 Units into the skin 3 (three) times daily with meals.) 45 mL 3   Lancets (ONETOUCH DELICA PLUS [id number]) MISC Use 4 times daily before meals and nightly. 400 each 3   lisinopril (ZESTRIL) 20 MG tablet Take 1 tablet (20 mg total) by mouth daily. 30 tablet 6   Multiple Vitamins-Minerals (MULTIVITAMIN ADULT) CHEW Chew 2 tablets by mouth daily.     mupirocin cream (BACTROBAN) 2 % Apply topically daily. Apply to left foot wound and cover with dry dressing once daily 30 g 0   TOUJEO SOLOSTAR 300 UNIT/ML Solostar Pen Inject 80 Units into the skin nightly. 30 mL 3   traMADol (ULTRAM) 50 MG tablet Take 1 tablet (50 mg total) by mouth every 12 (twelve) hours as needed. 14 tablet 1   TRUEPLUS PEN  NEEDLES 31G X 5 MM MISC See admin instructions.  11   VITAMIN D PO Take 2 tablets by mouth at bedtime.     BAQSIMI ONE PACK 3 MG/DOSE POWD Place 1 spray into the nose as needed (Low blood sugar). (Patient not taking: Reported on 11/07/2022)     No current facility-administered medications for this visit.    REVIEW OF SYSTEMS:  _0  denotes positive finding, _1  denotes negative finding Cardiac  Comments:  Chest pain or chest pressure:    Shortness of breath upon exertion:    Short of breath when lying flat:    Irregular heart rhythm:        Vascular    Pain in calf, thigh, or hip brought on by ambulation:    Pain in feet at night that wakes you up from your sleep:     Blood clot in your veins:    Leg swelling:  x       Pulmonary    Oxygen at home:    Productive cough:     Wheezing:         Neurologic    Sudden weakness in arms or legs:     Sudden numbness in arms or legs:     Sudden onset of difficulty speaking or slurred speech:    Temporary loss of vision in one eye:     Problems with dizziness:         Gastrointestinal    Blood in stool:     Vomited blood:         Genitourinary    Burning when urinating:     Blood in urine:        Psychiatric    Major depression:         Hematologic    Bleeding problems:    Problems with blood clotting too easily:        Skin    Rashes or ulcers:        Constitutional    Fever or chills:      PHYSICAL  EXAM: Vitals:   11/07/22 1202 11/07/22 1208  BP: (!) 213/75 (!) 189/81  Pulse: 63 63  Resp: 16   Temp: (!) 97.2 F (36.2 C)   TempSrc: Temporal   SpO2: 96%   Weight: 215 lb (97.5 kg)   Height: 6' (1.829 m)     GENERAL: The patient is a well-nourished female, in no acute distress. The vital signs are documented above. CARDIAC: There is a regular rate and rhythm.  VASCULAR:  Palpable femoral pulses bilaterally Palpable DP pulses bilaterally No new lower extremity tissue loss Previous amputation of left first toe  - healed PULMONARY: No respiratory distress. ABDOMEN: Soft and non-tender. MUSCULOSKELETAL: There are no major deformities or cyanosis. NEUROLOGIC: No focal weakness or paresthesias are detected. SKIN: There are no ulcers or rashes noted. PSYCHIATRIC: The patient has a normal affect.  DATA:   ABIs today are 1.1 on the right triphasic and 1.01 on the left triphasic  Left leg arterial duplex shows biphasic flow all the way to the distal AT with no recurrent stenosis  Assessment/Plan:  66 year old female presents for 16-monthinterval follow-up of her PAD.  She previously underwent a left AT angioplasty on 04/06/2022 for left fifth toe osteomyelitis.  This has since resolved after debridement with podiatry.  Today she has a palpable DP pulse on the left with normal triphasic waveform at the ankle.  No evidence of recurrent stenosis after the AT intervention.  Optimized from a vascular surgery standpoint.  Discussed continue aspirin statin Plavix for risk reduction.  Will have her follow-up in the [facility] clinic in 1 year.  She has ongoing complaints about leg swelling and we did get her a new pair of compression stockings today   CMarty Heck MD [facility]: [phone]

## 2022-11-10 LAB — ALDOSTERONE + RENIN ACTIVITY W/ RATIO
Aldos/Renin Ratio: 26 (ref 0.0–30.0)
Aldosterone: 6.6 ng/dL (ref 0.0–30.0)
Renin Activity, Plasma: 0.254 ng/mL/hr (ref 0.167–5.380)

## 2022-11-10 LAB — CORTISOL: Cortisol: 9.4 ug/dL (ref 6.2–19.4)

## 2022-11-10 LAB — TSH: TSH: 1.12 u[IU]/mL (ref 0.450–4.500)

## 2022-11-15 ENCOUNTER — Ambulatory Visit (INDEPENDENT_AMBULATORY_CARE_PROVIDER_SITE_OTHER): Payer: Medicare Other

## 2022-11-15 DIAGNOSIS — I10 Essential (primary) hypertension: Secondary | ICD-10-CM

## 2022-11-22 ENCOUNTER — Encounter: Payer: Self-pay | Admitting: Pharmacist

## 2022-11-22 DIAGNOSIS — Z9189 Other specified personal risk factors, not elsewhere classified: Secondary | ICD-10-CM

## 2022-11-22 NOTE — Progress Notes (Signed)
[facility])                                            [facility] Team                                        Statin Quality Measure Assessment    11/22/2022  Katherine [name] Nov 24, 1955 [id number]  Dr. Wynetta Emery,   I am a [facility] clinical pharmacist that reviews patients for statin quality initiatives.     Per review of chart and payor information, patient has a diagnosis of diabetes but is not currently filling a statin prescription.  This places patient into the SUPD (Statin Use In Patients with Diabetes) measure for CMS.    Atorvastatin is on the patient's medication list but has not been filled since 12/22.  She has a PCP follow up appointment on 11/24/21.  If deemed therapeutically appropriate, statin therapy could be evaluated at that appointment.  The 10-year ASCVD risk score (Arnett DK, et al., 2019) is: 41.4%   Values used to calculate the score:     Age: 67 years     Sex: Female     Is Non-Hispanic African American: Yes     Diabetic: Yes     Tobacco smoker: No     Systolic Blood Pressure: 673 mmHg     Is BP treated: Yes     HDL Cholesterol: 56 mg/dL     Total Cholesterol: 175 mg/dL 02/23/2021     Component Value Date/Time   CHOL 175 02/23/2021 1025   TRIG 106 02/23/2021 1025   HDL 56 02/23/2021 1025   CHOLHDL 3.1 02/23/2021 1025   CHOLHDL 3.8 09/19/2016 1452   VLDL 36 (H) 09/19/2016 1452   LDLCALC 100 (H) 02/23/2021 1025    Please consider ONE of the following recommendations:  Initiate high intensity statin Atorvastatin '40mg'$  once daily, #90, 3 refills   Rosuvastatin '20mg'$  once daily, #90, 3 refills    Initiate moderate intensity          statin with reduced frequency if prior          statin intolerance 1x weekly, #13, 3 refills   2x weekly, #26, 3 refills   3x weekly, #39, 3 refills    Code for past statin intolerance or  other exclusions (required annually)   Provider Requirements:  Associate  code during an office visit or telehealth encounter  Drug Induced Myopathy G72.0   Myopathy, unspecified G72.9   Myositis, unspecified M60.9   Rhabdomyolysis A19.37   Alcoholic fatty liver T02.4   Cirrhosis of liver K74.69   Prediabetes R73.03   PCOS E28.2   Toxic liver disease, unspecified K71.9        Plan: Route note to PCP prior to upcoming appointment.   Elayne Guerin, PharmD, [place] Clinical Pharmacist [phone]

## 2022-11-23 ENCOUNTER — Telehealth (HOSPITAL_BASED_OUTPATIENT_CLINIC_OR_DEPARTMENT_OTHER): Payer: Self-pay | Admitting: *Deleted

## 2022-11-23 ENCOUNTER — Ambulatory Visit (INDEPENDENT_AMBULATORY_CARE_PROVIDER_SITE_OTHER): Payer: Medicare Other

## 2022-11-23 DIAGNOSIS — R011 Cardiac murmur, unspecified: Secondary | ICD-10-CM | POA: Diagnosis not present

## 2022-11-23 DIAGNOSIS — I10 Essential (primary) hypertension: Secondary | ICD-10-CM | POA: Diagnosis not present

## 2022-11-23 LAB — ECHOCARDIOGRAM COMPLETE
Area-P 1/2: 3.65 cm2
S' Lateral: 2.85 cm

## 2022-11-23 MED ORDER — BLOOD PRESSURE KIT: PACK | 0 refills | Status: AC

## 2022-11-23 NOTE — Telephone Encounter (Signed)
Patient in for echo today and she has not received her home blood pressure machine  Reached out to [employer] and [place] is no longer covering blood pressure as of 11/1 Advised patient and sent Rx to [facility] as requested

## 2022-11-24 ENCOUNTER — Ambulatory Visit: Payer: Medicare Other | Attending: Internal Medicine | Admitting: Internal Medicine

## 2022-11-24 VITALS — BP 151/53 | HR 60 | Temp 98.3°F | Ht 72.0 in | Wt 216.0 lb

## 2022-11-24 DIAGNOSIS — I739 Peripheral vascular disease, unspecified: Secondary | ICD-10-CM | POA: Diagnosis not present

## 2022-11-24 DIAGNOSIS — E1159 Type 2 diabetes mellitus with other circulatory complications: Secondary | ICD-10-CM | POA: Diagnosis not present

## 2022-11-24 DIAGNOSIS — E1142 Type 2 diabetes mellitus with diabetic polyneuropathy: Secondary | ICD-10-CM

## 2022-11-24 DIAGNOSIS — I152 Hypertension secondary to endocrine disorders: Secondary | ICD-10-CM | POA: Diagnosis not present

## 2022-11-24 DIAGNOSIS — Z1211 Encounter for screening for malignant neoplasm of colon: Secondary | ICD-10-CM

## 2022-11-24 DIAGNOSIS — F320 Major depressive disorder, single episode, mild: Secondary | ICD-10-CM | POA: Insufficient documentation

## 2022-11-24 LAB — POCT GLYCOSYLATED HEMOGLOBIN (HGB A1C): HbA1c, POC (controlled diabetic range): 9.8 % — AB (ref 0.0–7.0)

## 2022-11-24 LAB — GLUCOSE, POCT (MANUAL RESULT ENTRY): POC Glucose: 142 mg/dl — AB (ref 70–99)

## 2022-11-24 MED ORDER — HYDROCHLOROTHIAZIDE 25 MG PO TABS: 25.0000 mg | ORAL_TABLET | Freq: Every day | ORAL | 6 refills | Status: DC

## 2022-11-24 NOTE — Progress Notes (Signed)
Patient ID: Katherine [name], female    DOB: July 05, 1956  MRN: [id number]  CC: Diabetes (DM f/u. Med refill. )   Subjective: Katherine [name] is a 67 y.o. female who presents for chronic ds management Her concerns today include:  Hx of HTN, DM with neuropathy, retinopathy and possible gastroparesis, HL, depression, GERD, PAD ( left anterior tibial angioplasty 03/2022 for left fifth toe osteomyelitis), s/p amputation LT 1st toe, BL RAS 59%.    HTN: On last visit with me in November, I referred her to advanced hypertension clinic with Dr. Oval Linsey.  Surprisingly her blood pressure on that visit was 126/52.  She was advised to continue her current antihypertensive including lisinopril 20 mg daily, carvedilol 6.25 mg BID, Hydralazine 100 mg TID, HCTZ 25 mg daily and amlodipine 10 mg daily.  Renin/aldosterone ratio was normal.  She was referred for a sleep study,echo and renal artery dopplers. Saw Dr. Carlis Abbott 11/07/2022 and BP was 189/81 -Today:  out of HCTZ x 2 wks and takes Hydralazine sometimes only twice instead of TID.  Took meds already this a.m.  Plans to purchase BP device to check BP 2x/day.  Has f/u visit with Dr. Oval Linsey on 12/07/2021 -limits salt  PAD: Had follow-up appointment with Dr. Carlis Abbott 11/07/2022.  Patient had undergone left anterior tibial angioplasty 03/2022 for left fifth toe osteomyelitis.  Found to have normal triphasic waveforms at the ankle on her visit with no evidence of recurrent stenosis.. Discussed continue aspirin statin Plavix for risk reduction.  Will have her follow-up in the [facility] clinic in 1 year.  She had ongoing complaints about leg swelling and he did give her a new pair of compression stockings. Pt thinks she accidentally left the pair that he gave her in his office.  She plans to stop by there to get a new pair.  DM:   Results for orders placed or performed in visit on 11/24/22  POCT glucose (manual entry)  Result Value Ref Range   POC Glucose 142 (A) 70 - 99 mg/dl   POCT glycosylated hemoglobin (Hb A1C)  Result Value Ref Range   Hemoglobin A1C     HbA1c POC (<> result, manual entry)     HbA1c, POC (prediabetic range)     HbA1c, POC (controlled diabetic range) 9.8 (A) 0.0 - 7.0 %  Last A1C A1C was 9.6 08/2022 Meds: reports compliance with taking Toujao 80 units daily but for mth of December she was taking 80 units BID because she is anxious to get A1C down.  A1c has not changed much from when it was last checked in October.  Gets low BS 2-3 a.m when she takes Toujeo twice a day.   Reports previous endo Dr,. Loanne Drilling had her on 160 units daily.  Went back to 80 units once a day since end of December.  Takes Novolog 15 units each meal. Last saw Dr. Grandville Silos, her new endocrinologist, at [facility] May 2023.  A1C at that time was 11.  She has an upcoming appointment in April. Our clinical pharmacist showed her how to apply [facility] on last visit 10/05/2022. Reports it stayed on for 3 days; fell off despite putting a patch on over it.  Had same issue with Dexcom.  Since then she has checked BS occasionally.  Has difficulty getting blood out of her finger.   -Feels her eating habits are not best.  By the time she fixes something for herself, no appetite.  Spouse has different taste so she  ends up having to cook something different for him at times.    History of mild depression.  PHQ-9 score today has improved.  Patient states that she has mild depression on and off but does not feel that she needs any medication or counseling at this time.  HM:  request that I resubmit referral for c-scope Patient Active Problem List   Diagnosis Date Noted   Mild major depression ([place]) 11/24/2022   Murmur 10/19/2022   Diabetic ulcer of left foot associated with type 2 diabetes mellitus ([place]) 04/10/2022   Medication monitoring encounter 04/10/2022   Cellulitis of left lower extremity    Neuropathy 12/08/2021   Diabetic gastroparesis associated with type 2 diabetes mellitus ([place])  07/30/2021   Type 2 diabetes mellitus with diabetic peripheral angiopathy without gangrene, with long-term current use of insulin ([place]) 07/30/2021   Amputation of left great toe ([place]) 06/13/2021   Microalbuminuria due to type 2 diabetes mellitus ([place]) 05/04/2021   Diabetes ([place]) 12/22/2019   Renal artery stenosis ([place]) 12/01/2019   Diabetic polyneuropathy associated with type 2 diabetes mellitus ([place]) 05/14/2018   Thyroid nodule 10/04/2017   Microcytic anemia 09/11/2017   Critical lower limb ischemia ([place]) 09/10/2017   Retinopathy 08/22/2017   Thyroid enlargement 07/12/2017   Dyslipidemia 09/20/2016   DEPRESSION 09/10/2009   LEG CRAMPS 05/19/2009   GERD 02/15/2009   Gastroparesis 09/03/2008   DENTAL CARIES 07/14/2008   Essential hypertension 09/16/2003     Current Outpatient Medications on File Prior to Visit  Medication Sig Dispense Refill   amLODipine (NORVASC) 10 MG tablet Take 1 tablet (10 mg total) by mouth daily. 30 tablet 6   aspirin EC 81 MG tablet Take 1 tablet (81 mg total) by mouth daily. Swallow whole. 150 tablet 2   atorvastatin (LIPITOR) 80 MG tablet Take 1 tablet (80 mg total) by mouth daily at 6 PM. (Patient taking differently: Take 80 mg by mouth at bedtime.) 30 tablet 6   BAQSIMI ONE PACK 3 MG/DOSE POWD Place 1 spray into the nose as needed (Low blood sugar).     Blood Pressure KIT Check blood pressure twice a day Dx: I10, hypertension 1 kit 0   carvedilol (COREG) 6.25 MG tablet Take 1 tablet (6.25 mg total) by mouth 2 (two) times daily with a meal. (Patient taking differently: Take 6.25 mg by mouth daily.) 60 tablet 6   clopidogrel (PLAVIX) 75 MG tablet TAKE 1 TABLET BY MOUTH EVERY DAY WITH BREAKFAST 90 tablet 3   Continuous Blood Gluc Receiver (FREESTYLE LIBRE 2 READER) DEVI Use as directed 3 times daily 1 each 12   Continuous Blood Gluc Receiver (FREESTYLE LIBRE 2 READER) DEVI Scan as needed for continuous glucose monitoring. 1 each 0   Continuous Blood Gluc  Sensor (FREESTYLE LIBRE 2 SENSOR) MISC Change sensor every 2 weeks. 2 each 12   Continuous Blood Gluc Sensor (FREESTYLE LIBRE 2 SENSOR) MISC Inject 1 sensor to the skin every 14 days for continuous glucose monitoring. 2 each 11   gabapentin (NEURONTIN) 600 MG tablet Take 1 tablet (600 mg total) by mouth 2 (two) times daily. (Patient taking differently: Take 650 mg by mouth 2 (two) times daily.) 60 tablet 6   glucose blood (ONETOUCH VERIO) test strip 1 each by Misc.(Non-Drug; Combo Route) route 4 times daily before meals and nightly. 400 each 3   hydrALAZINE (APRESOLINE) 100 MG tablet Take 1 tablet (100 mg total) by mouth 3 (three) times daily. 90 tablet 6   insulin aspart (  NOVOLOG) 100 UNIT/ML FlexPen Inject 10-17 units into the skin 3 times daily before meals. (Patient taking differently: Inject 10 Units into the skin 3 (three) times daily with meals.) 45 mL 3   lisinopril (ZESTRIL) 20 MG tablet Take 1 tablet (20 mg total) by mouth daily. 30 tablet 6   Multiple Vitamins-Minerals (MULTIVITAMIN ADULT) CHEW Chew 2 tablets by mouth daily.     mupirocin cream (BACTROBAN) 2 % Apply topically daily. Apply to left foot wound and cover with dry dressing once daily 30 g 0   TOUJEO SOLOSTAR 300 UNIT/ML Solostar Pen Inject 80 Units into the skin nightly. 30 mL 3   traMADol (ULTRAM) 50 MG tablet Take 1 tablet (50 mg total) by mouth every 12 (twelve) hours as needed. 14 tablet 1   VITAMIN D PO Take 2 tablets by mouth at bedtime.     Carboxymethylcellulose Sodium (EYE DROPS OP) Place 1 drop into both eyes daily. (Patient not taking: Reported on 11/24/2022)     Lancets (ONETOUCH DELICA PLUS [id number]) MISC Use 4 times daily before meals and nightly. 400 each 3   TRUEPLUS PEN NEEDLES 31G X 5 MM MISC See admin instructions.  11   No current facility-administered medications on file prior to visit.    Allergies  Allergen Reactions   Other Swelling    Seaweed= swelling on arms, hands and face   Metformin And  Related Nausea Only    Social History   Socioeconomic History   Marital status: Married    Spouse name: Not on file   Number of children: Not on file   Years of education: Not on file   Highest education level: Not on file  Occupational History   Not on file  Tobacco Use   Smoking status: Never    Passive exposure: Never   Smokeless tobacco: Never  Vaping Use   Vaping Use: Never used  Substance and Sexual Activity   Alcohol use: No   Drug use: No   Sexual activity: Never  Other Topics Concern   Not on file  Social History Narrative   Not on file   Social Determinants of Health   Financial Resource Strain: Low Risk  (10/05/2022)   Overall Financial Resource Strain (CARDIA)    Difficulty of Paying Living Expenses: Not hard at all  Food Insecurity: Food Insecurity Present (10/05/2022)   Hunger Vital Sign    Worried About Running Out of Food in the Last Year: Never true    Ran Out of Food in the Last Year: Sometimes true  Transportation Needs: No Transportation Needs (10/05/2022)   PRAPARE - Hydrologist (Medical): No    Lack of Transportation (Non-Medical): No  Physical Activity: Inactive (10/05/2022)   Exercise Vital Sign    Days of Exercise per Week: 0 days    Minutes of Exercise per Session: 0 min  Stress: No Stress Concern Present (10/05/2022)   [place]    Feeling of Stress : Only a little  Social Connections: Socially Integrated (10/05/2022)   Social Connection and Isolation Panel [NHANES]    Frequency of Communication with Friends and Family: More than three times a week    Frequency of Social Gatherings with Friends and Family: Once a week    Attends Religious Services: More than 4 times per year    Active Member of [employer] or Organizations: Yes    Attends Archivist Meetings: More than  4 times per year    Marital Status: Married  Human resources officer  Violence: Not At Risk (10/05/2022)   Humiliation, Afraid, Rape, and Kick questionnaire    Fear of Current or Ex-Partner: No    Emotionally Abused: No    Physically Abused: No    Sexually Abused: No    Family History  Problem Relation Age of Onset   Kidney disease Mother    Hypertension Mother    Diabetes Father    Hypertension Father    Hypertension Paternal Aunt    Diabetes Paternal Aunt    Hypertension Paternal Uncle    Diabetes Paternal Uncle    Cancer Maternal Grandmother    Hypertension Maternal Grandfather    Diabetes Paternal Grandfather    Colon cancer Neg Hx    Breast cancer Neg Hx     Past Surgical History:  Procedure Laterality Date   ABDOMINAL AORTOGRAM W/LOWER EXTREMITY N/A 04/06/2022   Procedure: ABDOMINAL AORTOGRAM W/LOWER EXTREMITY;  Surgeon: Marty Heck, MD;  Location: [facility];  Service: Cardiovascular;  Laterality: N/A;   AMPUTATION TOE Left 10/26/2017   Procedure: AMPUTATION PARTIAL RAY LEFT FOOT AND IRRIGATION/DEBRIDEMENT;  Surgeon: Edrick Kins, DPM;  Location: [facility];  Service: Podiatry;  Laterality: Left;  AMPUTATION PARTIAL RAY LEFT FOOT AND IRRIGATION/DEBRIDEMENT   CHOLECYSTECTOMY  01/24/2003   Archie Endo 04/04/2011   COLONOSCOPY     INCISION AND DRAINAGE ABSCESS  03/17/2012   "right but; left lower abdoment"/notes 03/17/2012   LOWER EXTREMITY INTERVENTION N/A 09/10/2017   Procedure: LOWER EXTREMITY INTERVENTION;  Surgeon: Lorretta Harp, MD;  Location: [facility];  Service: Cardiovascular;  Laterality: N/A;   PERIPHERAL VASCULAR ATHERECTOMY  09/10/2017   Procedure: PERIPHERAL VASCULAR ATHERECTOMY;  Surgeon: Lorretta Harp, MD;  Location: [facility];  Service: Cardiovascular;;  left AT   PERIPHERAL VASCULAR BALLOON ANGIOPLASTY Left 04/06/2022   Procedure: PERIPHERAL VASCULAR BALLOON ANGIOPLASTY;  Surgeon: Marty Heck, MD;  Location: [facility];  Service: Cardiovascular;  Laterality: Left;     ROS: Review of Systems Negative except as stated above  PHYSICAL EXAM: BP (!) 151/53 (BP Location: Left Arm, Patient Position: Sitting, Cuff Size: Normal)   Pulse 60   Temp 98.3 F (36.8 C) (Oral)   Ht 6' (1.829 m)   Wt 216 lb (98 kg)   SpO2 99%   BMI 29.29 kg/m   Physical Exam  General appearance - alert, well appearing, and in no distress Mental status - normal mood, behavior, speech, dress, motor activity, and thought processes Neck - supple, no significant adenopathy Chest - clear to auscultation, no wheezes, rales or rhonchi, symmetric air entry Heart - normal rate, regular rhythm, normal S1, S2,  Extremities - trace LE and pedal edema.  Ankles always swollen  Diabetic Foot Exam - Simple   Simple Foot Form Diabetic Foot exam was performed with the following findings: Yes 11/24/2022 11:11 AM  Visual Inspection See comments: Yes Sensation Testing See comments: Yes Pulse Check Posterior Tibialis and Dorsalis pulse intact bilaterally: Yes Comments Patient is flat-footed.  She has dystrophic nails that are not overgrown.  Decreased sensation on plantar surface of both feet on leap exam.  No ulcers seen        11/24/2022   11:13 AM 10/05/2022   11:57 AM 05/15/2022    3:19 PM  Depression screen PHQ 2/9  Decreased Interest 0 1 2  Down, Depressed, Hopeless '1 1 3  '$ PHQ - 2  Score '1 2 5  '$ Altered sleeping '1 1 1  '$ Tired, decreased energy '2 2 1  '$ Change in appetite 1 2 0  Feeling bad or failure about yourself  0 0 0  Trouble concentrating '1 2 1  '$ Moving slowly or fidgety/restless 0 0 2  Suicidal thoughts 0 0 0  PHQ-9 Score '6 9 10       '$ Latest Ref Rng & Units 04/24/2022   11:52 AM 04/06/2022    8:07 AM 04/06/2022    7:17 AM  CMP  Glucose 65 - 99 mg/dL 220  141  135   BUN 7 - 25 mg/dL '18  14  20   '$ Creatinine 0.50 - 1.05 mg/dL 0.95  0.60  0.80   Sodium 135 - 146 mmol/L 142  139  139   Potassium 3.5 - 5.3 mmol/L 3.8  3.5  5.9   Chloride 98 - 110 mmol/L 102  104  100    CO2 20 - 32 mmol/L 33     Calcium 8.6 - 10.4 mg/dL 9.4     Total Protein 6.1 - 8.1 g/dL 7.0     Total Bilirubin 0.2 - 1.2 mg/dL 0.6     AST 10 - 35 U/L 10     ALT 6 - 29 U/L 8      Lipid Panel     Component Value Date/Time   CHOL 175 02/23/2021 1025   TRIG 106 02/23/2021 1025   HDL 56 02/23/2021 1025   CHOLHDL 3.1 02/23/2021 1025   CHOLHDL 3.8 09/19/2016 1452   VLDL 36 (H) 09/19/2016 1452   LDLCALC 100 (H) 02/23/2021 1025    CBC    Component Value Date/Time   WBC 4.6 04/24/2022 1152   RBC 4.29 04/24/2022 1152   HGB 12.0 04/24/2022 1152   HGB 13.1 05/03/2021 1130   HCT 37.3 04/24/2022 1152   HCT 40.3 05/03/2021 1130   PLT 211 04/24/2022 1152   PLT 235 05/03/2021 1130   MCV 86.9 04/24/2022 1152   MCV 87 05/03/2021 1130   MCH 28.0 04/24/2022 1152   MCHC 32.2 04/24/2022 1152   RDW 14.0 04/24/2022 1152   RDW 12.5 05/03/2021 1130   LYMPHSABS 1.7 04/01/2022 0132   LYMPHSABS 1.9 08/28/2018 1424   MONOABS 0.7 04/01/2022 0132   EOSABS 0.2 04/01/2022 0132   EOSABS 0.2 08/28/2018 1424   BASOSABS 0.0 04/01/2022 0132   BASOSABS 0.0 08/28/2018 1424    ASSESSMENT AND PLAN: 1. Type 2 diabetes mellitus with peripheral neuropathy (HCC) Not at goal. I do not have any blood sugar readings to go by and helping to adjust her insulin.  I asked the clinical pharmacist to meet with her today to make sure that her technique and applying the Advance sensor is good and to see if there is any other options.  She plans to pick up a new sensor today.  Advised that if it continues to fall off, then we would have no other choice then for her to revert back to using manual glucometer. Follow-up with clinical pharmacist in about 6 weeks for recheck. Advised that she calls her endocrinologist and see if they can move up her appointment since she has not been seen since May of last year.  She plans to do so. - POCT glucose (manual entry) - POCT glycosylated hemoglobin (Hb A1C) - Microalbumin /  creatinine urine ratio - Comprehensive metabolic panel - Lipid panel  2. Hypertension associated with diabetes ([place]) Not at goal but improved  on recheck today.  She has been out of the hydrochlorothiazide for 2 weeks.  Refill sent on that.  Continue other medications as listed above. - hydrochlorothiazide (HYDRODIURIL) 25 MG tablet; Take 1 tablet (25 mg total) by mouth daily.  Dispense: 30 tablet; Refill: 6  3. Mild major depression ([place]) Patient states this is not a major issue for her at this time.  She does not feel she needs any medication or counseling.  4. PAD (peripheral artery disease) (HCC) Stable.  Continue aspirin and Plavix as recommended by vascular surgeon.  5. Screening for colon cancer - Ambulatory referral to Gastroenterology     Patient was given the opportunity to ask questions.  Patient verbalized understanding of the plan and was able to repeat key elements of the plan.   This documentation was completed using Radio producer.  Any transcriptional errors are unintentional.  Orders Placed This Encounter  Procedures   Microalbumin / creatinine urine ratio   Comprehensive metabolic panel   Lipid panel   Ambulatory referral to Gastroenterology   POCT glucose (manual entry)   POCT glycosylated hemoglobin (Hb A1C)     Requested Prescriptions   Signed Prescriptions Disp Refills   hydrochlorothiazide (HYDRODIURIL) 25 MG tablet 30 tablet 6    Sig: Take 1 tablet (25 mg total) by mouth daily.    Return in about 3 months (around 02/23/2023) for Appt with [facility] in 5 wks for BS check.  Karle Plumber, MD, FACP

## 2022-11-24 NOTE — Patient Instructions (Signed)
Please call your endocrinologist Dr. Grandville Silos to see if they can get you in before April. I will have our clinical pharmacist speak with you today about technique and placing the [facility] device.  I have sent refill on hydrochlorothiazide to your pharmacy.  I have resubmitted the referral to the gastroenterologist for colonoscopy.

## 2022-11-26 LAB — COMPREHENSIVE METABOLIC PANEL
ALT: 13 IU/L (ref 0–32)
AST: 15 IU/L (ref 0–40)
Albumin/Globulin Ratio: 1.4 (ref 1.2–2.2)
Albumin: 4 g/dL (ref 3.9–4.9)
Alkaline Phosphatase: 144 IU/L — ABNORMAL HIGH (ref 44–121)
BUN/Creatinine Ratio: 18 (ref 12–28)
BUN: 16 mg/dL (ref 8–27)
Bilirubin Total: 0.5 mg/dL (ref 0.0–1.2)
CO2: 26 mmol/L (ref 20–29)
Calcium: 9.7 mg/dL (ref 8.7–10.3)
Chloride: 99 mmol/L (ref 96–106)
Creatinine, Ser: 0.88 mg/dL (ref 0.57–1.00)
Globulin, Total: 2.9 g/dL (ref 1.5–4.5)
Glucose: 138 mg/dL — ABNORMAL HIGH (ref 70–99)
Potassium: 4.5 mmol/L (ref 3.5–5.2)
Sodium: 140 mmol/L (ref 134–144)
Total Protein: 6.9 g/dL (ref 6.0–8.5)
eGFR: 72 mL/min/{1.73_m2} (ref >59)

## 2022-11-26 LAB — LIPID PANEL
Chol/HDL Ratio: 1.9 ratio (ref 0.0–4.4)
Cholesterol, Total: 118 mg/dL (ref 100–199)
HDL: 61 mg/dL (ref >39)
LDL Chol Calc (NIH): 44 mg/dL (ref 0–99)
Triglycerides: 61 mg/dL (ref 0–149)
VLDL Cholesterol Cal: 13 mg/dL (ref 5–40)

## 2022-11-26 LAB — MICROALBUMIN / CREATININE URINE RATIO
Creatinine, Urine: 176.7 mg/dL
Microalb/Creat Ratio: 152 mg/g creat — ABNORMAL HIGH (ref 0–29)
Microalbumin, Urine: 268.7 ug/mL

## 2022-12-07 ENCOUNTER — Ambulatory Visit (HOSPITAL_BASED_OUTPATIENT_CLINIC_OR_DEPARTMENT_OTHER): Payer: 59 | Admitting: Family

## 2022-12-07 NOTE — Progress Notes (Deleted)
Advanced Hypertension Clinic Assessment:    Date:  12/07/2022   ID:  Katherine [name], DOB 10-May-1956, MRN [id number]  PCP:  Ladell Pier, MD  Cardiologist:  None  Nephrologist:  Referring MD: Ladell Pier, MD   CC: Hypertension  History of Present Illness:    Katherine [name] is a 67 y.o. female with a hx of PAD, toe ulcer s/p amputation LT 1st toe, HTN, HLD, Dm2 with neuropathy and retinopathy, possible gastroparesis, diabetic ketoacidoses, anemia, depression here to follow up in the Advanced Hypertension Clinic.   Saw Dr. Gwenlyn Found 02/2021. Prior peripheral angiography 08/2017 occluded L anterior tibial and posterior tibial artery with patent peroneal. He recanulized anterior tibial down to foot. ABI 04/2022 normal bilaterally.   Saw PCP 10/05/22 with BP 160/84 despite Carvedilol, Amlodipine, Hydralazine, HCTZ, Lisinopril. She was referred to Hypertension Clinic.   Seen by Dr. Oval Linsey 10/19/22 to establish. CLinic BP 124/51 but higher at home. 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 She was encouraged to monitor BP at home. TSH, renin-aldosterone, cortisol were unremarkable. Sleep study ***. Renal dopplers 11/15/22 bilateral 1-59% stenosis.. Echocardiogram ordered due to murmur performed 11/23/22 LVEF 60-65%, moderate LVH, no significant valvular abnormalities.   She presents today for follow up.     Previous antihypertensives:  Past Medical History:  Diagnosis Date   Anemia    requiring transfusion   Boil of buttock ~ 2016   Depression    Diabetic neuropathy ([place])    Archie Endo 08/23/2017   DKA (diabetic ketoacidoses)    recent/notes 01/06/2015   Hyperlipidemia    Hypertension    Murmur 10/19/2022   Neuropathy    Peripheral vascular disease ([place])    Toe ulcer ([place])    left great toe/notes 08/23/2017   Type II diabetes mellitus ([place])     Past Surgical History:  Procedure Laterality Date   ABDOMINAL AORTOGRAM W/LOWER  EXTREMITY N/A 04/06/2022   Procedure: ABDOMINAL AORTOGRAM W/LOWER EXTREMITY;  Surgeon: Marty Heck, MD;  Location: [facility] [facility];  Service: Cardiovascular;  Laterality: N/A;   AMPUTATION TOE Left 10/26/2017   Procedure: AMPUTATION PARTIAL RAY LEFT FOOT AND IRRIGATION/DEBRIDEMENT;  Surgeon: Edrick Kins, DPM;  Location: [facility];  Service: Podiatry;  Laterality: Left;  AMPUTATION PARTIAL RAY LEFT FOOT AND IRRIGATION/DEBRIDEMENT   CHOLECYSTECTOMY  01/24/2003   Archie Endo 04/04/2011   COLONOSCOPY     INCISION AND DRAINAGE ABSCESS  03/17/2012   "right but; left lower abdoment"/notes 03/17/2012   LOWER EXTREMITY INTERVENTION N/A 09/10/2017   Procedure: LOWER EXTREMITY INTERVENTION;  Surgeon: Lorretta Harp, MD;  Location: [facility];  Service: Cardiovascular;  Laterality: N/A;   PERIPHERAL VASCULAR ATHERECTOMY  09/10/2017   Procedure: PERIPHERAL VASCULAR ATHERECTOMY;  Surgeon: Lorretta Harp, MD;  Location: [facility];  Service: Cardiovascular;;  left AT   PERIPHERAL VASCULAR BALLOON ANGIOPLASTY Left 04/06/2022   Procedure: PERIPHERAL VASCULAR BALLOON ANGIOPLASTY;  Surgeon: Marty Heck, MD;  Location: [place] [facility];  Service: Cardiovascular;  Laterality: Left;    Current Medications: No outpatient medications have been marked as taking for the 12/07/22 encounter (Appointment) with Loel Dubonnet, NP.     Allergies:   Other and Metformin and related   Social History   Socioeconomic History   Marital status: Married    Spouse name: Not on file   Number of children: Not on file   Years of education: Not on  file   Highest education level: Not on file  Occupational History   Not on file  Tobacco Use   Smoking status: Never    Passive exposure: Never   Smokeless tobacco: Never  Vaping Use   Vaping Use: Never used  Substance and Sexual Activity   Alcohol use: No   Drug use: No   Sexual activity: Never  Other Topics Concern   Not on file   Social History Narrative   Not on file   Social Determinants of Health   Financial Resource Strain: Low Risk  (10/05/2022)   Overall Financial Resource Strain (CARDIA)    Difficulty of Paying Living Expenses: Not hard at all  Food Insecurity: Food Insecurity Present (10/05/2022)   Hunger Vital Sign    Worried About Running Out of Food in the Last Year: Never true    Ran Out of Food in the Last Year: Sometimes true  Transportation Needs: No Transportation Needs (10/05/2022)   PRAPARE - Hydrologist (Medical): No    Lack of Transportation (Non-Medical): No  Physical Activity: Inactive (10/05/2022)   Exercise Vital Sign    Days of Exercise per Week: 0 days    Minutes of Exercise per Session: 0 min  Stress: No Stress Concern Present (10/05/2022)   [place]    Feeling of Stress : Only a little  Social Connections: Socially Integrated (10/05/2022)   Social Connection and Isolation Panel [NHANES]    Frequency of Communication with Friends and Family: More than three times a week    Frequency of Social Gatherings with Friends and Family: Once a week    Attends Religious Services: More than 4 times per year    Active Member of [employer] or Organizations: Yes    Attends Music therapist: More than 4 times per year    Marital Status: Married     Family History: The patient's family history includes Cancer in her maternal grandmother; Diabetes in her father, paternal aunt, paternal grandfather, and paternal uncle; Hypertension in her father, maternal grandfather, mother, paternal aunt, and paternal uncle; Kidney disease in her mother. There is no history of Colon cancer or Breast cancer.  ROS:   Please see the history of present illness.     All other systems reviewed and are negative.  EKGs/Labs/Other Studies Reviewed:    EKG:  EKG is not ordered today.    Echo 11/23/22  1. Left  ventricular ejection fraction, by estimation, is 60 to 65%. Left  ventricular ejection fraction by 3D volume is 59 %. The left ventricle has  normal function. The left ventricle has no regional wall motion  abnormalities. There is moderate  concentric left ventricular hypertrophy. Left ventricular diastolic  parameters are indeterminate. Elevated left ventricular end-diastolic  pressure.   2. Right ventricular systolic function is normal. The right ventricular  size is mildly enlarged. Tricuspid regurgitation signal is inadequate for  assessing PA pressure.   3. Right atrial size was mildly dilated.   4. The mitral valve is normal in structure. Trivial mitral valve  regurgitation. No evidence of mitral stenosis.   5. The aortic valve is normal in structure. Aortic valve regurgitation is  trivial. No aortic stenosis is present.   6. The inferior vena cava is normal in size with greater than 50%  respiratory variability, suggesting right atrial pressure of 3 mmHg.   Renal duplex 11/15/22 Right: 1-59% stenosis of the right  renal artery. RRV flow present.         Abnormal right Resistive Index. Normal cortical thickness of         right kidney. Normal size right kidney.  Left:  1-59% stenosis of the left renal artery. LRV flow present.         Normal cortical thickness of the left kidney. Normal size of         left kidney. Abnormal left Resisitve Index.    LE Venous Doppler  08/07/2022: Summary:  Left:  - No evidence of deep vein thrombosis seen in the left lower extremity,  from the common femoral through the popliteal veins.  - No evidence of superficial venous thrombosis in the left lower  extremity.  - No evidence of superficial venous reflux seen in the left short  saphenous vein.  - Venous reflux is noted in the left greater saphenous vein in the thigh.    ABI  05/16/2022: Right ABIs appear essentially unchanged. Left ABIs appear increased  compared to prior study on  04/01/2022.    Summary:  Right: Resting right ankle-brachial index is within normal range. No  evidence of significant right lower extremity arterial disease. The right  toe-brachial index is abnormal.   Left: Resting left ankle-brachial index is within normal range. No  evidence of significant left lower extremity arterial disease.    LE Arterial Duplex Study  05/16/2022: Summary:  Left: Widely patent left lower extremity arterial system.  Biphasic waveforms observed in the mid and distal anterior tibial artery.    Abdominal Aortogram with Peripheral Vascular Angioplasty  04/06/2022: Pre-operative Diagnosis: Left fifth toe osteomyelitis with known peripheral arterial disease Post-operative diagnosis:  Same Surgeon:  Marty Heck, MD Procedure Performed: 1.  Ultrasound-guided access right common femoral artery 2.  Aortogram with catheter selection of aorta 3.  Left lower extremity arteriogram with selection of third order branches 4.  Left anterior tibial artery angioplasty (3 mm x 100 mm Sterling) 5.  Mynx closure of the right common femoral artery 6.  57 minutes of monitored moderate conscious sedation time   Findings:  Aortogram showed patent renal arteries bilaterally with no flow-limiting stenosis in the aortoiliac segment.  The left common femoral, profunda, SFA, popliteal arteries are all widely patent.  She has posterior tibial artery occlusion.  Dominant runoff is through the anterior tibial and peroneal.  The peroneal is patent but diseased distally at the ankle and it splits into two branches.  The anterior tibial had two moderate stenosis >60% in the midportion of the vessel.  Ultimately the anterior tibial artery stenosis was crossed antegrade and angioplastied with a 3 mm Sterling.  No significant residual stenosis.  Preserved runoff and optimized from a vascular surgery standpoint.   Plan: Continue Plavix statin.  We will arrange follow-up in 1 month with arterial  duplex.  Optimized for toe amputation.   Recent Labs: 03/31/2022: Magnesium 2.1 04/24/2022: Hemoglobin 12.0; Platelets 211 10/31/2022: TSH 1.120 11/24/2022: ALT 13; BUN 16; Creatinine, Ser 0.88; Potassium 4.5; Sodium 140   Recent Lipid Panel    Component Value Date/Time   CHOL 118 11/24/2022 1119   TRIG 61 11/24/2022 1119   HDL 61 11/24/2022 1119   CHOLHDL 1.9 11/24/2022 1119   CHOLHDL 3.8 09/19/2016 1452   VLDL 36 (H) 09/19/2016 1452   LDLCALC 44 11/24/2022 1119    Physical Exam:   VS:  There were no vitals taken for this visit. , BMI There is no height or  weight on file to calculate BMI. GENERAL:  Well appearing HEENT: Pupils equal round and reactive, fundi not visualized, oral mucosa unremarkable NECK:  No jugular venous distention, waveform within normal limits, carotid upstroke brisk and symmetric, no bruits, no thyromegaly LYMPHATICS:  No cervical adenopathy LUNGS:  Clear to auscultation bilaterally HEART:  RRR.  PMI not displaced or sustained,S1 and S2 within normal limits, no S3, no S4, no clicks, no rubs, *** murmurs ABD:  Flat, positive bowel sounds normal in frequency in pitch, no bruits, no rebound, no guarding, no midline pulsatile mass, no hepatomegaly, no splenomegaly EXT:  2 plus pulses throughout, no edema, no cyanosis no clubbing SKIN:  No rashes no nodules NEURO:  Cranial nerves II through XII grossly intact, motor grossly intact throughout PSYCH:  Cognitively intact, oriented to person place and time   ASSESSMENT/PLAN:    HTN -   Renal artery stenosis - Duplex 11/15/22 bilateral 1-59% stenosis. ***  Screening for Secondary Hypertension: { Click here to document screening for secondary causes of HTN  :[id number]}    10/19/2022   11:23 AM  Causes  Drugs/Herbals Screened     - Comments limits salt.  Does eat out some. Daily coffee x2.No EtOH.  No NSAIDS  Renovascular HTN Screened     - Comments check renal artery Doppler  Sleep Apnea Screened     -  Comments Snores, AM fatigue, daytime somnolence.  Check sleep study  Thyroid Disease Screened     - Comments Check TSh  Hyperaldosteronism Screened     - Comments Check renin and aldosterone  Pheochromocytoma N/A  Cushing's Syndrome Screened     - Comments check AM cortisol    Relevant Labs/Studies:    Latest Ref Rng & Units 11/24/2022   11:19 AM 04/24/2022   11:52 AM 04/06/2022    8:07 AM  Basic Labs  Sodium 134 - 144 mmol/L 140  142  139   Potassium 3.5 - 5.2 mmol/L 4.5  3.8  3.5   Creatinine 0.57 - 1.00 mg/dL 0.88  0.95  0.60        Latest Ref Rng & Units 10/31/2022   11:06 AM 08/28/2018    2:24 PM  Thyroid   TSH 0.450 - 4.500 uIU/mL 1.120  1.370        Latest Ref Rng & Units 10/31/2022   11:06 AM  Renin/Aldosterone   Aldosterone 0.0 - 30.0 ng/dL 6.6   Aldos/Renin Ratio 0.0 - 30.0 26.0              11/15/2022   12:32 PM  Renovascular   Renal Artery [place] Completed Yes        she consents to be monitored in our remote patient monitoring program through [place].  she will track his blood pressure twice daily and understands that these trends will help [place] to adjust her medications as needed prior to his next appointment.  she *** interested in enrolling in the PREP exercise and nutrition program through the [facility].     Disposition:    FU with MD/PharmD in {gen number 7-89:381017} {Days to years:10300}    Medication Adjustments/Labs and Tests Ordered: Current medicines are reviewed at length with the patient today.  Concerns regarding medicines are outlined above.  No orders of the defined types were placed in this encounter.  No orders of the defined types were placed in this encounter.    Signed, Loel Dubonnet, NP  12/07/2022 10:48 AM      [facility]

## 2022-12-11 ENCOUNTER — Telehealth: Payer: Self-pay | Admitting: Internal Medicine

## 2022-12-11 NOTE — Telephone Encounter (Signed)
Pt called asking or pin needles for her insulin.  She does not know the name of what she is taking.  She said she called the pharamcy and they ask her to call her provider.  CB#  [phone]

## 2022-12-14 ENCOUNTER — Telehealth: Payer: Self-pay | Admitting: *Deleted

## 2022-12-14 NOTE — Telephone Encounter (Signed)
Pt.is taking Plavix OV Scheduled with Nevin Bloodgood 01/18/23,pt.aware that pre-visit and procedure cancelled for 01/22/23

## 2022-12-14 NOTE — Telephone Encounter (Signed)
Attempted to contact x 2 unable to leave message voicemail box not set up,pt. Scheduled as a direct colonoscopy,attempting to clarify if pt. Is taking PLAVIX ( ANTICOAGULANT) will need OV?

## 2022-12-21 DIAGNOSIS — H35033 Hypertensive retinopathy, bilateral: Secondary | ICD-10-CM | POA: Diagnosis not present

## 2022-12-21 DIAGNOSIS — H538 Other visual disturbances: Secondary | ICD-10-CM | POA: Diagnosis not present

## 2022-12-21 DIAGNOSIS — Z794 Long term (current) use of insulin: Secondary | ICD-10-CM | POA: Diagnosis not present

## 2022-12-21 DIAGNOSIS — H40023 Open angle with borderline findings, high risk, bilateral: Secondary | ICD-10-CM | POA: Diagnosis not present

## 2022-12-21 DIAGNOSIS — E119 Type 2 diabetes mellitus without complications: Secondary | ICD-10-CM | POA: Diagnosis not present

## 2022-12-21 DIAGNOSIS — H25813 Combined forms of age-related cataract, bilateral: Secondary | ICD-10-CM | POA: Diagnosis not present

## 2022-12-21 LAB — HM DIABETES EYE EXAM

## 2022-12-22 ENCOUNTER — Other Ambulatory Visit: Payer: Self-pay | Admitting: Internal Medicine

## 2022-12-22 MED ORDER — TOUJEO SOLOSTAR 300 UNIT/ML ~~LOC~~ SOPN: PEN_INJECTOR | SUBCUTANEOUS | 3 refills | Status: DC

## 2022-12-22 MED ORDER — TRUEPLUS PEN NEEDLES 31G X 5 MM MISC: 2 refills | Status: AC

## 2022-12-22 NOTE — Telephone Encounter (Signed)
Medication Refill - Medication: TOUJEO SOLOSTAR 300 UNIT/ML Solostar Pen  TRUEPLUS PEN NEEDLES 31G X 5 MM MISC   Has the patient contacted their pharmacy? Yes.    Pharmacy referred pt to PCP    Preferred Pharmacy (with phone number or street name[facility]  [id number] [place] [phone] Fax: [phone]    Has the patient been seen for an appointment in the last year OR does the patient have an upcoming appointment? Yes.    Agent: Please be advised that RX refills may take up to 3 business days. We ask that you follow-up with your pharmacy.

## 2022-12-22 NOTE — Telephone Encounter (Signed)
Requested medication (s) are due for refill today: yes  Requested medication (s) are on the active medication list: yes  Last refill:  toujeo 09/09/21, pen needles 06/11/18  Future visit scheduled: yes  Notes to clinic:  historical meds, please review for refill.      Requested Prescriptions  Pending Prescriptions Disp Refills   TRUEPLUS PEN NEEDLES 31G X 5 MM MISC  11    Sig: See admin instructions.     Endocrinology: Diabetes - Testing Supplies Passed - 12/22/2022  1:28 PM      Passed - Valid encounter within last 12 months    Recent Outpatient Visits           4 weeks ago Type 2 diabetes mellitus with peripheral neuropathy [facility])   [place] Ladell Pier, MD   2 months ago Encounter for [employer] annual wellness exam   [place] Karle Plumber B, MD   4 months ago Type 2 diabetes mellitus with peripheral neuropathy [facility])   [place] Karle Plumber B, MD   7 months ago Hypertension associated with diabetes [facility])   [place] Karle Plumber B, MD   1 year ago Uncontrolled type 2 diabetes mellitus with peripheral neuropathy [facility])   [place], MD       Future Appointments             In 1 week Daisy Blossom, Jarome Matin, [place]   In 2 months Ladell Pier, MD [place] SOLOSTAR 300 UNIT/ML Solostar Pen 30 mL 3    Sig: Inject 80 Units into the skin nightly.     Endocrinology:  Diabetes - Insulins Failed - 12/22/2022  1:28 PM      Failed - HBA1C is between 0 and 7.9 and within 180 days    HbA1c, POC (controlled diabetic range)  Date Value Ref Range Status  11/24/2022 9.8 (A) 0.0 - 7.0 % Final         Passed - Valid encounter within last 6 months     Recent Outpatient Visits           4 weeks ago Type 2 diabetes mellitus with peripheral neuropathy [facility])   [place] Ladell Pier, MD   2 months ago Encounter for [employer] annual wellness exam   [place] Karle Plumber B, MD   4 months ago Type 2 diabetes mellitus with peripheral neuropathy [facility])   [place] Karle Plumber B, MD   7 months ago Hypertension associated with diabetes [facility])   [place] Karle Plumber B, MD   1 year ago Uncontrolled type 2 diabetes mellitus with peripheral neuropathy [facility])   [place], MD       Future Appointments             In 1 week Daisy Blossom, Jarome Matin, [place]   In 2 months Wynetta Emery, Dalbert Batman, MD [place]

## 2023-01-04 ENCOUNTER — Ambulatory Visit: Payer: 59 | Admitting: Pharmacist

## 2023-01-04 NOTE — Telephone Encounter (Signed)
Patient had echo and has reviewed in mychart

## 2023-01-18 ENCOUNTER — Telehealth: Payer: Self-pay

## 2023-01-18 ENCOUNTER — Encounter: Payer: Self-pay | Admitting: Nurse Practitioner

## 2023-01-18 ENCOUNTER — Ambulatory Visit (INDEPENDENT_AMBULATORY_CARE_PROVIDER_SITE_OTHER): Payer: 59 | Admitting: Nurse Practitioner

## 2023-01-18 VITALS — BP 150/80 | HR 59 | Ht 72.0 in | Wt 221.0 lb

## 2023-01-18 DIAGNOSIS — Z86010 Personal history of colonic polyps: Secondary | ICD-10-CM

## 2023-01-18 DIAGNOSIS — Z7901 Long term (current) use of anticoagulants: Secondary | ICD-10-CM | POA: Diagnosis not present

## 2023-01-18 MED ORDER — NA SULFATE-K SULFATE-MG SULF 17.5-3.13-1.6 GM/177ML PO SOLN: ORAL | 0 refills | Status: DC

## 2023-01-18 NOTE — Progress Notes (Signed)
Assessment:  # 67 year old female with a history of colon polyps (a small tubular adenoma was removed in 2018).  We discussed the updated polyp surveillance guidelines which would allow for a 7 year rather than 5 year follow up colonoscopy.   She would like to stay with the 5-year recall colonoscopy.   # PAD, status post angioplasty of left lower extremity.  She is on Plavix.   # DM2.   # See PMH / [place] for additional history.   Plan:  - Schedule for a polyp surveillance colonoscopy. The risks and benefits of colonoscopy with possible polypectomy / biopsies were discussed and the patient agrees to proceed.  - Hold Plavix for 5 days before procedure - will instruct when and how to resume after procedure. Patient understands that there is a low but real risk of cardiovascular event such as heart attack, stroke, or embolism /  thrombosis, or ischemia while off Plavix. The patient consents to proceed. Will communicate by phone or EMR with patient's prescribing provider to confirm that holding Plavix is reasonable in this case.    HPI:   Chief Complaint: Time for colonoscopy  Katherine [name] is a 67 y.o. year old female known to Dr. Fuller Plan with a past medical history of DM2, HTN, PAD on plavix., colon polyps, . See PMH / [place] for additional history. Referred by PCP for colon cancer screening.   A small tubular adenoma on last colonoscopy in Jan 2018. She was told a 5 year colonoscopy was recommended.   No blood in stool. No [place] of colon cancer. No GI complaints.   [name] started Plavix a few years ago for PAD. She is s/p left tibial angioplasty, LLE angioplasty .    Previous Labs / Imaging::    Latest Ref Rng & Units 04/24/2022   11:52 AM 04/06/2022    8:07 AM 04/06/2022    7:17 AM  CBC  WBC 3.8 - 10.8 Thousand/uL 4.6     Hemoglobin 11.7 - 15.5 g/dL 12.0  12.2  11.6   Hematocrit 35.0 - 45.0 % 37.3  36.0  34.0   Platelets 140 - 400 Thousand/uL 211       Lab Results  Component Value  Date   LIPASE 23 05/24/2018      Latest Ref Rng & Units 11/24/2022   11:19 AM 04/24/2022   11:52 AM 04/06/2022    8:07 AM  CMP  Glucose 70 - 99 mg/dL 138  220  141   BUN 8 - 27 mg/dL '16  18  14   '$ Creatinine 0.57 - 1.00 mg/dL 0.88  0.95  0.60   Sodium 134 - 144 mmol/L 140  142  139   Potassium 3.5 - 5.2 mmol/L 4.5  3.8  3.5   Chloride 96 - 106 mmol/L 99  102  104   CO2 20 - 29 mmol/L 26  33    Calcium 8.7 - 10.3 mg/dL 9.7  9.4    Total Protein 6.0 - 8.5 g/dL 6.9  7.0    Total Bilirubin 0.0 - 1.2 mg/dL 0.5  0.6    Alkaline Phos 44 - 121 IU/L 144     AST 0 - 40 IU/L 15  10    ALT 0 - 32 IU/L 13  8            Imaging:  ECHOCARDIOGRAM COMPLETE    ECHOCARDIOGRAM REPORT       Patient Name:   Katherine [name] Date  of Exam: 11/23/2022 Medical Rec #:  [id number]      Height:       72.0 in Accession #:    [id number]     Weight:       215.0 lb Date of Birth:  [birth year]       BSA:          2.197 m Patient Age:    67 years       BP:           120/75 mmHg Patient Gender: F              HR:           71 bpm. Exam Location:  Outpatient  Procedure: 2D Echo, 3D Echo, Cardiac Doppler, Color Doppler and Strain Analysis  Indications:    R01.1 Murmur   History:        Patient has no prior history of Echocardiogram examinations.                 Risk Factors:Hypertension, Diabetes, Dyslipidemia and                 Non-Smoker. Renal artery stenosis.   Sonographer:    Leavy Cella RDCS Referring Phys: [id number] TIFFANY [place]  IMPRESSIONS   1. Left ventricular ejection fraction, by estimation, is 60 to 65%. Left ventricular ejection fraction by 3D volume is 59 %. The left ventricle has normal function. The left ventricle has no regional wall motion abnormalities. There is moderate  concentric left ventricular hypertrophy. Left ventricular diastolic parameters are indeterminate. Elevated left ventricular end-diastolic pressure.  2. Right ventricular systolic function is normal. The right  ventricular size is mildly enlarged. Tricuspid regurgitation signal is inadequate for assessing PA pressure.  3. Right atrial size was mildly dilated.  4. The mitral valve is normal in structure. Trivial mitral valve regurgitation. No evidence of mitral stenosis.  5. The aortic valve is normal in structure. Aortic valve regurgitation is trivial. No aortic stenosis is present.  6. The inferior vena cava is normal in size with greater than 50% respiratory variability, suggesting right atrial pressure of 3 mmHg.  FINDINGS  Left Ventricle: Left ventricular ejection fraction, by estimation, is 60 to 65%. Left ventricular ejection fraction by 3D volume is 59 %. The left ventricle has normal function. The left ventricle has no regional wall motion abnormalities. Global  longitudinal strain performed but not reported based on interpreter judgement due to suboptimal tracking. The left ventricular internal cavity size was normal in size. There is moderate concentric left ventricular hypertrophy. Left ventricular diastolic  parameters are indeterminate. Elevated left ventricular end-diastolic pressure.  Right Ventricle: The right ventricular size is mildly enlarged. No increase in right ventricular wall thickness. Right ventricular systolic function is normal. Tricuspid regurgitation signal is inadequate for assessing PA pressure.  Left Atrium: Left atrial size was normal in size.  Right Atrium: Right atrial size was mildly dilated.  Pericardium: There is no evidence of pericardial effusion.  Mitral Valve: The mitral valve is normal in structure. Trivial mitral valve regurgitation. No evidence of mitral valve stenosis.  Tricuspid Valve: The tricuspid valve is normal in structure. Tricuspid valve regurgitation is not demonstrated. No evidence of tricuspid stenosis.  Aortic Valve: The aortic valve is normal in structure. Aortic valve regurgitation is trivial. No aortic stenosis is present.  Pulmonic  Valve: The pulmonic valve was normal in structure. Pulmonic valve regurgitation is not visualized. No evidence of pulmonic stenosis.  Aorta: The aortic  root is normal in size and structure.  Venous: The inferior vena cava is normal in size with greater than 50% respiratory variability, suggesting right atrial pressure of 3 mmHg.  IAS/Shunts: No atrial level shunt detected by color flow Doppler.    LEFT VENTRICLE PLAX 2D LVIDd:         4.26 cm         Diastology LVIDs:         2.85 cm         LV e' medial:    5.11 cm/s LV PW:         1.40 cm         LV E/e' medial:  17.2 LV IVS:        1.35 cm         LV e' lateral:   5.33 cm/s LVOT diam:     2.10 cm         LV E/e' lateral: 16.5 LV SV:         67 LV SV Index:   30 LVOT Area:     3.46 cm        3D Volume EF                                LV 3D EF:    Left                                             ventricul                                             ar                                             ejection                                             fraction                                             by 3D                                             volume is                                             59 %.                                  3D Volume EF:  3D EF:        59 %                                LV EDV:       173 ml                                LV ESV:       70 ml                                LV SV:        102 ml  RIGHT VENTRICLE RV Basal diam:  4.39 cm RV Mid diam:    2.91 cm RV S prime:     17.60 cm/s TAPSE (M-mode): 3.9 cm  LEFT ATRIUM             Index        RIGHT ATRIUM           Index LA diam:        5.00 cm 2.28 cm/m   RA Area:     17.80 cm LA Vol (A2C):   69.9 ml 31.82 ml/m  RA Volume:   48.20 ml  21.94 ml/m LA Vol (A4C):   60.6 ml 27.58 ml/m LA Biplane Vol: 68.2 ml 31.04 ml/m  AORTIC VALVE LVOT Vmax:   81.80 cm/s LVOT Vmean:  54.000 cm/s LVOT VTI:    0.193 m    AORTA Ao Root diam: 3.10 cm Ao Asc diam:  3.30 cm  MITRAL VALVE MV Area (PHT): 3.65 cm    SHUNTS MV Decel Time: 208 msec    Systemic VTI:  0.19 m MV E velocity: 88.00 cm/s  Systemic Diam: 2.10 cm MV A velocity: 94.30 cm/s MV E/A ratio:  0.93  Fransico Him MD Electronically signed by Fransico Him MD Signature Date/Time: 11/23/2022/1:49:04 PM      Final      Past Medical History:  Diagnosis Date   Anemia    requiring transfusion   Boil of buttock ~ 2016   Depression    Diabetic neuropathy ([place])    Archie Endo 08/23/2017   DKA (diabetic ketoacidoses)    recent/notes 01/06/2015   Hyperlipidemia    Hypertension    Murmur 10/19/2022   Neuropathy    Peripheral vascular disease ([place])    Toe ulcer ([place])    left great toe/notes 08/23/2017   Type II diabetes mellitus ([place])    Past Surgical History:  Procedure Laterality Date   ABDOMINAL AORTOGRAM W/LOWER EXTREMITY N/A 04/06/2022   Procedure: ABDOMINAL AORTOGRAM W/LOWER EXTREMITY;  Surgeon: Marty Heck, MD;  Location: [facility];  Service: Cardiovascular;  Laterality: N/A;   AMPUTATION TOE Left 10/26/2017   Procedure: AMPUTATION PARTIAL RAY LEFT FOOT AND IRRIGATION/DEBRIDEMENT;  Surgeon: Edrick Kins, DPM;  Location: [facility];  Service: Podiatry;  Laterality: Left;  AMPUTATION PARTIAL RAY LEFT FOOT AND IRRIGATION/DEBRIDEMENT   CHOLECYSTECTOMY  01/24/2003   Archie Endo 04/04/2011   COLONOSCOPY     INCISION AND DRAINAGE ABSCESS  03/17/2012   "right but; left lower abdoment"/notes 03/17/2012   LOWER EXTREMITY INTERVENTION N/A 09/10/2017   Procedure: LOWER EXTREMITY INTERVENTION;  Surgeon: Lorretta Harp, MD;  Location: [facility];  Service: Cardiovascular;  Laterality: N/A;   PERIPHERAL VASCULAR  ATHERECTOMY  09/10/2017   Procedure: PERIPHERAL VASCULAR ATHERECTOMY;  Surgeon: Lorretta Harp, MD;  Location: [facility];  Service: Cardiovascular;;  left AT   PERIPHERAL VASCULAR BALLOON ANGIOPLASTY Left  04/06/2022   Procedure: PERIPHERAL VASCULAR BALLOON ANGIOPLASTY;  Surgeon: Marty Heck, MD;  Location: [facility];  Service: Cardiovascular;  Laterality: Left;   Family History  Problem Relation Age of Onset   Kidney disease Mother    Hypertension Mother    Diabetes Father    Hypertension Father    Hypertension Paternal Aunt    Diabetes Paternal Aunt    Hypertension Paternal Uncle    Diabetes Paternal Uncle    Cancer Maternal Grandmother    Hypertension Maternal Grandfather    Diabetes Paternal Grandfather    Colon cancer Neg Hx    Breast cancer Neg Hx    Social History   Tobacco Use   Smoking status: Never    Passive exposure: Never   Smokeless tobacco: Never  Vaping Use   Vaping Use: Never used  Substance Use Topics   Alcohol use: No   Drug use: No   Current Outpatient Medications  Medication Sig Dispense Refill   amLODipine (NORVASC) 10 MG tablet Take 1 tablet (10 mg total) by mouth daily. 30 tablet 6   aspirin EC 81 MG tablet Take 1 tablet (81 mg total) by mouth daily. Swallow whole. 150 tablet 2   atorvastatin (LIPITOR) 80 MG tablet Take 1 tablet (80 mg total) by mouth daily at 6 PM. (Patient taking differently: Take 80 mg by mouth at bedtime.) 30 tablet 6   BAQSIMI ONE PACK 3 MG/DOSE POWD Place 1 spray into the nose as needed (Low blood sugar).     Blood Pressure KIT Check blood pressure twice a day Dx: I10, hypertension 1 kit 0   Carboxymethylcellulose Sodium (EYE DROPS OP) Place 1 drop into both eyes daily.     carvedilol (COREG) 6.25 MG tablet Take 1 tablet (6.25 mg total) by mouth 2 (two) times daily with a meal. (Patient taking differently: Take 6.25 mg by mouth daily.) 60 tablet 6   clopidogrel (PLAVIX) 75 MG tablet TAKE 1 TABLET BY MOUTH EVERY DAY WITH BREAKFAST 90 tablet 3   Continuous Blood Gluc Receiver (FREESTYLE LIBRE 2 READER) DEVI Use as directed 3 times daily 1 each 12   Continuous Blood Gluc Receiver (FREESTYLE LIBRE 2 READER) DEVI Scan  as needed for continuous glucose monitoring. 1 each 0   Continuous Blood Gluc Sensor (FREESTYLE LIBRE 2 SENSOR) MISC Change sensor every 2 weeks. 2 each 12   Continuous Blood Gluc Sensor (FREESTYLE LIBRE 2 SENSOR) MISC Inject 1 sensor to the skin every 14 days for continuous glucose monitoring. 2 each 11   gabapentin (NEURONTIN) 600 MG tablet Take 1 tablet (600 mg total) by mouth 2 (two) times daily. (Patient taking differently: Take 650 mg by mouth 2 (two) times daily.) 60 tablet 6   glucose blood (ONETOUCH VERIO) test strip 1 each by Misc.(Non-Drug; Combo Route) route 4 times daily before meals and nightly. 400 each 3   hydrALAZINE (APRESOLINE) 100 MG tablet Take 1 tablet (100 mg total) by mouth 3 (three) times daily. 90 tablet 6   hydrochlorothiazide (HYDRODIURIL) 25 MG tablet Take 1 tablet (25 mg total) by mouth daily. 30 tablet 6   insulin aspart (NOVOLOG) 100 UNIT/ML FlexPen Inject 10-17 units into the skin 3 times daily before meals. (Patient taking differently: Inject 10 Units into the skin  3 (three) times daily with meals.) 45 mL 3   Lancets (ONETOUCH DELICA PLUS 123XX123) MISC Use 4 times daily before meals and nightly. 400 each 3   lisinopril (ZESTRIL) 20 MG tablet Take 1 tablet (20 mg total) by mouth daily. 30 tablet 6   Multiple Vitamins-Minerals (MULTIVITAMIN ADULT) CHEW Chew 2 tablets by mouth daily.     mupirocin cream (BACTROBAN) 2 % Apply topically daily. Apply to left foot wound and cover with dry dressing once daily 30 g 0   TOUJEO SOLOSTAR 300 UNIT/ML Solostar Pen Inject 80 Units into the skin nightly. 30 mL 3   traMADol (ULTRAM) 50 MG tablet Take 1 tablet (50 mg total) by mouth every 12 (twelve) hours as needed. 14 tablet 1   TRUEPLUS PEN NEEDLES 31G X 5 MM MISC Use to inject Toujeo once daily. 100 each 2   VITAMIN D PO Take 2 tablets by mouth at bedtime.     No current facility-administered medications for this visit.   Allergies  Allergen Reactions   Other Swelling     Seaweed= swelling on arms, hands and face   Metformin And Related Nausea Only     Review of Systems: All systems reviewed and negative except where noted in HPI.   Wt Readings from Last 3 Encounters:  01/18/23 221 lb (100.2 kg)  11/24/22 216 lb (98 kg)  11/07/22 215 lb (97.5 kg)    Physical Exam:  BP (!) 150/80   Pulse (!) 59   Ht 6' (1.829 m)   Wt 221 lb (100.2 kg)   BMI 29.97 kg/m  Constitutional:  Pleasant, generally well appearing female in no acute distress. Psychiatric:  Normal mood and affect. Behavior is normal. EENT: Pupils normal.  Conjunctivae are normal. No scleral icterus. Neck supple.  Cardiovascular: Normal rate, regular rhythm.  Pulmonary/chest: Effort normal and breath sounds normal. No wheezing, rales or rhonchi. Abdominal: Soft, nondistended, nontender. Bowel sounds active throughout. There are no masses palpable. No hepatomegaly. Neurological: Alert and oriented to person place and time. Skin: Skin is warm and dry. No rashes noted.  Tye Savoy, NP  01/18/2023, 11:46 AM  Cc:  Referring Provider Ladell Pier, MD

## 2023-01-18 NOTE — Patient Instructions (Addendum)
You have been scheduled for a colonoscopy. Please follow written instructions given to you at your visit today.  Please pick up your prep supplies at the pharmacy within the next 1-3 days. If you use inhalers (even only as needed), please bring them with you on the day of your procedure.   We have sent the following medications to your pharmacy for you to pick up at your convenience: [place] will be contacted by our office prior to your procedure for directions on holding your Plavix.  If you do not hear from our office 1 week prior to your scheduled procedure, please call [phone] to discuss.    If your blood pressure at your visit was 140/90 or greater, please contact your primary care physician to follow up on this.  _______________________________________________________  If you are age 48 or older, your body mass index should be between 23-30. Your Body mass index is 29.97 kg/m. If this is out of the aforementioned range listed, please consider follow up with your Primary Care Provider.  If you are age 57 or younger, your body mass index should be between 19-25. Your Body mass index is 29.97 kg/m. If this is out of the aformentioned range listed, please consider follow up with your Primary Care Provider.   ________________________________________________________  The  GI providers would like to encourage you to use [facility] to communicate with providers for non-urgent requests or questions.  Due to long hold times on the telephone, sending your provider a message by [facility] may be a faster and more efficient way to get a response.  Please allow 48 business hours for a response.  Please remember that this is for non-urgent requests.  _______________________________________________________  Due to recent changes in healthcare laws, you may see the results of your imaging and laboratory studies on MyChart before your provider has had a chance to review them.  We understand that  in some cases there may be results that are confusing or concerning to you. Not all laboratory results come back in the same time frame and the provider may be waiting for multiple results in order to interpret others.  Please give [place] 48 hours in order for your provider to thoroughly review all the results before contacting the office for clarification of your results.    Thank you for entrusting me with your care and choosing [facility].  Tye Savoy NP

## 2023-01-18 NOTE — Telephone Encounter (Signed)
[facility] Pre-operative Risk Assessment     Request for surgical clearance:     Endoscopy Procedure  What type of surgery is being performed?     Colonoscopy   When is this surgery scheduled? 02/28/23  What type of clearance is required ?   Pharmacy  Are there any medications that need to be held prior to surgery and how long? Plavix x 5 days  Practice name and name of physician performing surgery?      [facility]  What is your office phone and fax number?      Phone- [phone]  [phone]  Anesthesia type (None, local, MAC, general) ?       MAC

## 2023-01-18 NOTE — Telephone Encounter (Signed)
   Katherine [name] [birth year] [id number]  Dear Dr Carlis Abbott  We have scheduled the above named patient for a(n) Colonoscopy procedure. Our records show that (s)he is on anticoagulation therapy.  Please advise as to whether the patient may come off their therapy of Plavix 5 days prior to their procedure which is scheduled for 02/28/23.  Please route your response to [place] or fax response to [phone].  Sincerely,    [facility]

## 2023-01-22 ENCOUNTER — Telehealth: Payer: Self-pay

## 2023-01-22 ENCOUNTER — Encounter: Payer: Medicare Other | Admitting: Gastroenterology

## 2023-01-22 NOTE — Telephone Encounter (Signed)
Spoke to patient advised per Dr Carlis Abbott okay to hold Plavix 5 days prior to procedure. Patient verbalized understanding.

## 2023-01-23 NOTE — Telephone Encounter (Signed)
More than 1 year out.  Okay to hold 5 days preop.  Restart 2 days postop   Glenetta Hew, MD

## 2023-01-24 ENCOUNTER — Telehealth: Payer: Self-pay | Admitting: *Deleted

## 2023-01-24 NOTE — Telephone Encounter (Signed)
Pt has been scheduled for tele pre op appt 02/09/23 @ 9 am. Med rec and consent are done.

## 2023-01-24 NOTE — Telephone Encounter (Signed)
Pt has been scheduled for tele pre op appt 02/09/23 @ 9 am. Med rec and consent are done.     Patient Consent for Virtual Visit        Katherine [name] has provided verbal consent on 01/24/2023 for a virtual visit (video or telephone).   CONSENT FOR VIRTUAL VISIT FOR:  Katherine [name]  By participating in this virtual visit I agree to the following:  I hereby voluntarily request, consent and authorize [facility] and its employed or contracted physicians, physician assistants, nurse practitioners or other licensed health care professionals (the Practitioner), to provide me with telemedicine health care services (the "Services") as deemed necessary by the treating Practitioner. I acknowledge and consent to receive the Services by the Practitioner via telemedicine. I understand that the telemedicine visit will involve communicating with the Practitioner through live audiovisual communication technology and the disclosure of certain medical information by electronic transmission. I acknowledge that I have been given the opportunity to request an in-person assessment or other available alternative prior to the telemedicine visit and am voluntarily participating in the telemedicine visit.  I understand that I have the right to withhold or withdraw my consent to the use of telemedicine in the course of my care at any time, without affecting my right to future care or treatment, and that the Practitioner or I may terminate the telemedicine visit at any time. I understand that I have the right to inspect all information obtained and/or recorded in the course of the telemedicine visit and may receive copies of available information for a reasonable fee.  I understand that some of the potential risks of receiving the Services via telemedicine include:  Delay or interruption in medical evaluation due to technological equipment failure or disruption; Information transmitted may not be sufficient (e.g. poor  resolution of images) to allow for appropriate medical decision making by the Practitioner; and/or  In rare instances, security protocols could fail, causing a breach of personal health information.  Furthermore, I acknowledge that it is my responsibility to provide information about my medical history, conditions and care that is complete and accurate to the best of my ability. I acknowledge that Practitioner's advice, recommendations, and/or decision may be based on factors not within their control, such as incomplete or inaccurate data provided by me or distortions of diagnostic images or specimens that may result from electronic transmissions. I understand that the practice of medicine is not an exact science and that Practitioner makes no warranties or guarantees regarding treatment outcomes. I acknowledge that a copy of this consent can be made available to me via my patient portal ([facility]), or I can request a printed copy by calling the office of [facility].    I understand that my insurance will be billed for this visit.   I have read or had this consent read to me. I understand the contents of this consent, which adequately explains the benefits and risks of the Services being provided via telemedicine.  I have been provided ample opportunity to ask questions regarding this consent and the Services and have had my questions answered to my satisfaction. I give my informed consent for the services to be provided through the use of telemedicine in my medical care

## 2023-01-24 NOTE — Telephone Encounter (Signed)
   Name: Katherine [name]  DOB: 1956/05/25  MRN: [id number]  Primary Cardiologist: None   Preoperative team, please contact this patient and set up a phone call appointment for further preoperative risk assessment. Please obtain consent and complete medication review. Thank you for your help.  I confirm that guidance regarding antiplatelet and oral anticoagulation therapy has been completed and, if necessary, noted below.  Her Plavix may be held for 5 days prior to her procedure.  Please resume as soon as hemostasis is achieved.   Deberah Pelton, NP 01/24/2023, 1:39 PM [facility]

## 2023-01-24 NOTE — Telephone Encounter (Signed)
Called and spoke to patient. She is aware to hold her Plavix starting on 4-5

## 2023-01-24 NOTE — Telephone Encounter (Signed)
Patient has been notified to hold Plavix for 5 days starting on 4-5 for procedure on 4-10 with Dr. Fuller Plan

## 2023-01-30 ENCOUNTER — Telehealth: Payer: Self-pay | Admitting: Internal Medicine

## 2023-01-30 NOTE — Telephone Encounter (Signed)
Copied from [place] [phone]. Topic: Appointment Scheduling - Scheduling Inquiry for Clinic >> Jan 30, 2023  2:21 PM Santiya F wrote: Reason for CRM: Pt is calling in to speak with the nurse regarding scheduling her TB test. Please follow up with pt.

## 2023-02-01 NOTE — Telephone Encounter (Signed)
Call placed to patient unable . Unable to left VM not setup

## 2023-02-02 NOTE — Telephone Encounter (Signed)
Pt scheduled for TB skin test

## 2023-02-05 ENCOUNTER — Ambulatory Visit: Payer: 59 | Attending: Internal Medicine

## 2023-02-05 DIAGNOSIS — Z111 Encounter for screening for respiratory tuberculosis: Secondary | ICD-10-CM | POA: Diagnosis not present

## 2023-02-05 NOTE — Progress Notes (Signed)
Patient in today for PPD placement  place in the right arm. 97mm wheal noted. Tolerate well. Patient scheduled an appointment for Friday 02/07/2023.

## 2023-02-07 ENCOUNTER — Ambulatory Visit: Payer: 59 | Attending: Internal Medicine

## 2023-02-07 DIAGNOSIS — Z111 Encounter for screening for respiratory tuberculosis: Secondary | ICD-10-CM | POA: Diagnosis not present

## 2023-02-07 LAB — TB SKIN TEST
Induration: 0 mm
TB Skin Test: NEGATIVE

## 2023-02-07 NOTE — Progress Notes (Signed)
PPD Reading Note  PPD read and results entered in EpicCare.  Result: 0 mm induration.  Interpretation: neg  Allergic reaction: no

## 2023-02-09 ENCOUNTER — Ambulatory Visit: Payer: 59 | Attending: Cardiovascular Disease

## 2023-02-09 DIAGNOSIS — Z01810 Encounter for preprocedural cardiovascular examination: Secondary | ICD-10-CM

## 2023-02-09 NOTE — Progress Notes (Signed)
Patient did not answer the phone so will need rescheduled.   Elgie Collard, PA-C

## 2023-02-19 ENCOUNTER — Encounter: Payer: Self-pay | Admitting: Gastroenterology

## 2023-02-20 ENCOUNTER — Telehealth: Payer: Self-pay | Admitting: Cardiovascular Disease

## 2023-02-20 NOTE — Telephone Encounter (Signed)
Spoke to patient she stated she was returning a call from 02/09/23.Advised she had a tele visit with Nicholes Rough PA on 3/22.I will send message to pre op cma.

## 2023-02-20 NOTE — Telephone Encounter (Signed)
Patient called and said she was returning a phone call from last week. Patient had a tele preop visit on 02/09/23

## 2023-02-21 NOTE — Telephone Encounter (Signed)
I received a secure chat from Jordan Likes, RN that the pt was on her way up to [address] office to reschedule her tele pre op appt that she missed 02/09/23. We had not been able to reach the pt to reschedule the tele pre op appt. However, see notes today from GI office as well.   I have added the pt onto 02/22/23 1:40 tele pre op appt. Pt will need to be sure to answer her phone. Pt's procedure is planned for 02/28/23.

## 2023-02-21 NOTE — Telephone Encounter (Signed)
Please review

## 2023-02-21 NOTE — Telephone Encounter (Addendum)
I was unable to reach Katherine [name], her husband [name] or her daughter [name]. The phones either ring busy, busy or say they don't have voice mail set up. I will send her a [place] message to please call cardiology and r/s her phone appointment STAT as her procedure is next week. The phone # to her heart Doctor's office is [phone] Dr Skeet Latch.

## 2023-02-21 NOTE — Telephone Encounter (Signed)
Patient is returning call. Please advise? 

## 2023-02-21 NOTE — Telephone Encounter (Signed)
Message was sent to pre op call back today. Pt called back yesterday about pre op clearance tele appt. Pt did not answer the phone 02/09/23 when pre op APP Nicholes Rough, [facility] tried to reach the pt. We tried to reach the pt to reschedule but were unable to reach the pt. I tried today to reach the pt but vm is not set up, so could not leave message. Pt needs to reschedule her tele appt. I will send FYI to surgeon office pt needs to reschedule her tele appt.

## 2023-02-21 NOTE — Telephone Encounter (Signed)
Our front desk had let me know the pt did not come by the office to reschedule her tele visit., see previous notes.   I called the pt and she answered. I d/w the pt about her needing cardiac clearance for her procedure coming up 02/28/23. Pt kept telling me she came by our office today at 4:20 and was told that I had already left. I stated, no that I was here and our front desk said that she never came by the office. In discussing with the pt she began talking about Dr. Monica Martinez in our office. I said we do not have a Dr. Monica Martinez. Pt was insistent that we did. I tried to explain to the pt x 4, Dr. Carlis Abbott is with [facility], she said yes vein and vascular. I said yes but that is not our office, we are cardiology. I explained that she saw Dr. Skeet Latch 09/2022, pt first said no and then stated yes but only saw her once and has not been back. I stated in the meantime Dr. Oval Linsey is listed as her primary cardiologist and we have been asked to provide clearance for upcoming procedure 02/28/23 with [facility]. Pt is very confused as to what providers she see's. I did tell the pt that we have her scheduled for tele appt 1:40 tomorrow 02/22/23 for cardiac clearance, pt is agreeable to plan of care. I did tell the pt she will need to be sure to answer her phone.

## 2023-02-21 NOTE — Telephone Encounter (Signed)
Message was sent to pre op call back today. Pt called back yesterday about pre op clearance tele appt. Pt did not answer the phone 02/09/23 when pre op APP Nicholes Rough, [facility] tried to reach the pt. We tried to reach the pt to reschedule but were unable to reach the pt. I tried today to reach the pt but vm is not set up, so could not leave message. Pt needs to reschedule her tele appt. I will send FYI to surgeon office pt needs to reschedule her tele appt.

## 2023-02-21 NOTE — Telephone Encounter (Signed)
Returned call to patient.Stated she is on her way to our [facility] office to reschedule her appointment.Stated her surgery is scheduled for 02/28/23.

## 2023-02-21 NOTE — Telephone Encounter (Signed)
Pt has not yet been cleared and should had not been given instructions for holding her blood thinner, this will come from the cardiologist when the pt has her tele visit for pre op clearance. Please see notes as the pt did not answer her phone 02/09/23 for the tele appt an only just called back yesterday and message was sent to pre op today. Pt has NOT been cleared by cardiology yet at this time.    Message was sent to pre op call back today. Pt called back yesterday about pre op clearance tele appt. Pt did not answer the phone 02/09/23 when pre op APP Nicholes Rough, [facility] tried to reach the pt. We tried to reach the pt to reschedule but were unable to reach the pt. I tried today to reach the pt but vm is not set up, so could not leave message. Pt needs to reschedule her tele appt. I will send FYI to surgeon office pt needs to reschedule her tele appt.

## 2023-02-22 ENCOUNTER — Ambulatory Visit: Payer: 59 | Attending: Cardiology

## 2023-02-22 ENCOUNTER — Telehealth: Payer: Self-pay

## 2023-02-22 ENCOUNTER — Other Ambulatory Visit: Payer: Self-pay

## 2023-02-22 DIAGNOSIS — Z01810 Encounter for preprocedural cardiovascular examination: Secondary | ICD-10-CM

## 2023-02-22 MED ORDER — MUPIROCIN CALCIUM 2 % EX CREA: TOPICAL_CREAM | Freq: Every day | CUTANEOUS | 0 refills | Status: DC

## 2023-02-22 NOTE — Telephone Encounter (Signed)
Triage message received that patient requested to talk with RN about procedure. Returned patient's call and she explained that she contacted our office in error regarding clearance needed from [facility] for pending colonoscopy, but instead clearance needed by cardiology. States she has already spoken them.   Patient also requested a refill on Mupirocin cream that was previously prescribed for left fifth toe. She denies any concerns, but would like to still apply cream to a small, healing area. Refill sent to pharmacy.

## 2023-02-22 NOTE — Progress Notes (Signed)
4 attempts made to contact patient via phone call for preoperative cardiac evaluation.  Patient's voicemail has not yet been set up.  Patient will need to reschedule telephone appointment or be seen in the clinic for preoperative cardiac evaluation.  Jossie Ng. Dejohn Ibarra NP-C     02/22/2023, 4:56 PM [facility] [place] [address] Office [phone] Fax [phone]

## 2023-02-22 NOTE — Telephone Encounter (Signed)
Chart reviewed as part of pre-operative protocol coverage. Given past medical history and time since last visit, based on ACC/AHA guidelines, Katherine [name] would be at acceptable risk for the planned procedure without further cardiovascular testing.    Her Plavix may be held for 5 days prior to her procedure. Please resume as soon as hemostasis is achieved.    Patient was advised that if she develops new symptoms prior to surgery to contact our office to arrange a follow-up appointment.  She verbalized understanding.

## 2023-02-26 ENCOUNTER — Encounter: Payer: Self-pay | Admitting: Pharmacist

## 2023-02-26 ENCOUNTER — Telehealth: Payer: Self-pay

## 2023-02-26 NOTE — Progress Notes (Signed)
[facility]) [facility] Quality Pharmacy Team Statin Quality Measure Assessment  02/26/2023  Katherine [name] [birth year] [id number]  Per review of chart and payor information, patient has a diagnosis of diabetes but is not currently filling a statin prescription.  This places patient into the Statin Use In Patients with Diabetes (SUPD) measure for CMS.    Atorvastatin is on the patient's medication list but has not been filled in over a year per fill history.  She has an upcoming provider's visit on 02/27/23.  If deemed therapeutically appropriate, her statin therapy could be assessed. Patient could be trialed on an alternative statin, or if she has experienced statin intolerance, a statin exclusion code could be associated with the visit.   The ASCVD Risk score (Arnett DK, et al., 2019) failed to calculate for the following reasons:   The valid total cholesterol range is 130 to 320 mg/dL 12/24/1989     Component Value Date/Time   CHOL 118 11/24/2022 1119   TRIG 61 11/24/2022 1119   HDL 61 11/24/2022 1119   CHOLHDL 1.9 11/24/2022 1119   CHOLHDL 3.8 09/19/2016 1452   VLDL 36 (H) 09/19/2016 1452   LDLCALC 44 11/24/2022 1119    Please consider ONE of the following recommendations:  Initiate high intensity statin Atorvastatin 40 mg once daily, #90, 3 refills   Rosuvastatin 20 mg once daily, #90, 3 refills    Initiate moderate intensity          statin with reduced frequency if prior          statin intolerance 1x weekly, #13, 3 refills   2x weekly, #26, 3 refills   3x weekly, #39, 3 refills    Code for past statin intolerance or  other exclusions (required annually)  Provider Requirements: Associate code during an office visit or telehealth encounter  Drug Induced Myopathy G72.0   Myopathy, unspecified G72.9   Myositis, unspecified M60.9   Rhabdomyolysis M62.82   Cirrhosis of liver K74.69   Prediabetes R73.03   PCOS E28.2   Plan: Route note to Provider prior to the  upcoming appointment.  Beecher Mcardle, PharmD, BCACP [facility] Clinical Pharmacist [phone]

## 2023-02-26 NOTE — Telephone Encounter (Signed)
Received fax from cardiology patient ma hold Plavix 5 day's prior to procedure. Patient is aware and verbalized understanding

## 2023-02-27 ENCOUNTER — Ambulatory Visit: Payer: 59 | Admitting: Internal Medicine

## 2023-02-27 ENCOUNTER — Other Ambulatory Visit: Payer: Self-pay

## 2023-02-27 DIAGNOSIS — N182 Chronic kidney disease, stage 2 (mild): Secondary | ICD-10-CM | POA: Diagnosis not present

## 2023-02-27 DIAGNOSIS — E1122 Type 2 diabetes mellitus with diabetic chronic kidney disease: Secondary | ICD-10-CM | POA: Diagnosis not present

## 2023-02-27 DIAGNOSIS — I160 Hypertensive urgency: Secondary | ICD-10-CM | POA: Diagnosis not present

## 2023-02-27 DIAGNOSIS — E1142 Type 2 diabetes mellitus with diabetic polyneuropathy: Secondary | ICD-10-CM | POA: Diagnosis not present

## 2023-02-27 DIAGNOSIS — E1151 Type 2 diabetes mellitus with diabetic peripheral angiopathy without gangrene: Secondary | ICD-10-CM | POA: Diagnosis not present

## 2023-02-27 DIAGNOSIS — R809 Proteinuria, unspecified: Secondary | ICD-10-CM | POA: Diagnosis not present

## 2023-02-27 DIAGNOSIS — Z794 Long term (current) use of insulin: Secondary | ICD-10-CM | POA: Diagnosis not present

## 2023-02-27 DIAGNOSIS — E113299 Type 2 diabetes mellitus with mild nonproliferative diabetic retinopathy without macular edema, unspecified eye: Secondary | ICD-10-CM | POA: Diagnosis not present

## 2023-02-27 DIAGNOSIS — E1165 Type 2 diabetes mellitus with hyperglycemia: Secondary | ICD-10-CM | POA: Diagnosis not present

## 2023-02-27 DIAGNOSIS — I129 Hypertensive chronic kidney disease with stage 1 through stage 4 chronic kidney disease, or unspecified chronic kidney disease: Secondary | ICD-10-CM | POA: Diagnosis not present

## 2023-02-27 NOTE — Progress Notes (Deleted)
Patient ID: Katherine [name], female    DOB: 28-Apr-1956  MRN: [id number]  CC: No chief complaint on file.   Subjective: Katherine [name] is a 67 y.o. female who presents for Her concerns today include:  Hx of HTN, DM with neuropathy, retinopathy and possible gastroparesis, HL, depression, GERD, PAD ( left anterior tibial angioplasty 03/2022 for left fifth toe osteomyelitis), s/p amputation LT 1st toe, BL RAS 59%.      She was advised to continue her current antihypertensive including lisinopril 20 mg daily, carvedilol 6.25 mg BID, Hydralazine 100 mg TID, HCTZ 25 mg daily and amlodipine 10 mg daily.    Patient Active Problem List   Diagnosis Date Noted   Mild major depression 11/24/2022   Murmur 10/19/2022   Medication monitoring encounter 04/10/2022   Cellulitis of left lower extremity    Neuropathy 12/08/2021   Diabetic gastroparesis associated with type 2 diabetes mellitus 07/30/2021   Type 2 diabetes mellitus with diabetic peripheral angiopathy without gangrene, with long-term current use of insulin 07/30/2021   Amputation of left great toe 06/13/2021   Microalbuminuria due to type 2 diabetes mellitus 05/04/2021   Diabetes 12/22/2019   Renal artery stenosis 12/01/2019   Diabetic polyneuropathy associated with type 2 diabetes mellitus 05/14/2018   Thyroid nodule 10/04/2017   Microcytic anemia 09/11/2017   Critical lower limb ischemia 09/10/2017   Retinopathy 08/22/2017   Thyroid enlargement 07/12/2017   Dyslipidemia 09/20/2016   DEPRESSION 09/10/2009   LEG CRAMPS 05/19/2009   GERD 02/15/2009   Gastroparesis 09/03/2008   DENTAL CARIES 07/14/2008   Essential hypertension 09/16/2003     Current Outpatient Medications on File Prior to Visit  Medication Sig Dispense Refill   amLODipine (NORVASC) 10 MG tablet Take 1 tablet (10 mg total) by mouth daily. 30 tablet 6   aspirin EC 81 MG tablet Take 1 tablet (81 mg total) by mouth daily. Swallow whole. 150 tablet 2   atorvastatin  (LIPITOR) 80 MG tablet Take 1 tablet (80 mg total) by mouth daily at 6 PM. (Patient taking differently: Take 80 mg by mouth at bedtime.) 30 tablet 6   BAQSIMI ONE PACK 3 MG/DOSE POWD Place 1 spray into the nose as needed (Low blood sugar). (Patient not taking: Reported on 01/24/2023)     Blood Pressure KIT Check blood pressure twice a day Dx: I10, hypertension 1 kit 0   Carboxymethylcellulose Sodium (EYE DROPS OP) Place 1 drop into both eyes daily.     carvedilol (COREG) 6.25 MG tablet Take 1 tablet (6.25 mg total) by mouth 2 (two) times daily with a meal. (Patient taking differently: Take 6.25 mg by mouth daily.) 60 tablet 6   clopidogrel (PLAVIX) 75 MG tablet TAKE 1 TABLET BY MOUTH EVERY DAY WITH BREAKFAST 90 tablet 3   Continuous Blood Gluc Receiver (FREESTYLE LIBRE 2 READER) DEVI Use as directed 3 times daily 1 each 12   Continuous Blood Gluc Receiver (FREESTYLE LIBRE 2 READER) DEVI Scan as needed for continuous glucose monitoring. 1 each 0   Continuous Blood Gluc Sensor (FREESTYLE LIBRE 2 SENSOR) MISC Change sensor every 2 weeks. 2 each 12   Continuous Blood Gluc Sensor (FREESTYLE LIBRE 2 SENSOR) MISC Inject 1 sensor to the skin every 14 days for continuous glucose monitoring. 2 each 11   gabapentin (NEURONTIN) 600 MG tablet Take 1 tablet (600 mg total) by mouth 2 (two) times daily. (Patient taking differently: Take 650 mg by mouth 2 (two) times daily.) 60 tablet 6  glucose blood (ONETOUCH VERIO) test strip 1 each by Misc.(Non-Drug; Combo Route) route 4 times daily before meals and nightly. 400 each 3   hydrALAZINE (APRESOLINE) 100 MG tablet Take 1 tablet (100 mg total) by mouth 3 (three) times daily. 90 tablet 6   hydrochlorothiazide (HYDRODIURIL) 25 MG tablet Take 1 tablet (25 mg total) by mouth daily. 30 tablet 6   insulin aspart (NOVOLOG) 100 UNIT/ML FlexPen Inject 10-17 units into the skin 3 times daily before meals. (Patient taking differently: Inject 10 Units into the skin 3 (three) times  daily with meals.) 45 mL 3   Lancets (ONETOUCH DELICA PLUS LANCET33G) MISC Use 4 times daily before meals and nightly. 400 each 3   lisinopril (ZESTRIL) 20 MG tablet Take 1 tablet (20 mg total) by mouth daily. 30 tablet 6   Multiple Vitamins-Minerals (MULTIVITAMIN ADULT) CHEW Chew 2 tablets by mouth daily.     mupirocin cream (BACTROBAN) 2 % Apply topically daily. Apply to left foot wound and cover with dry dressing once daily 30 g 0   Na Sulfate-K Sulfate-Mg Sulf 17.5-3.13-1.6 GM/177ML SOLN Use as directed; may use generic; goodrx card if insurance will not cover generic 354 mL 0   TOUJEO SOLOSTAR 300 UNIT/ML Solostar Pen Inject 80 Units into the skin nightly. 30 mL 3   traMADol (ULTRAM) 50 MG tablet Take 1 tablet (50 mg total) by mouth every 12 (twelve) hours as needed. 14 tablet 1   TRUEPLUS PEN NEEDLES 31G X 5 MM MISC Use to inject Toujeo once daily. 100 each 2   VITAMIN D PO Take 2 tablets by mouth at bedtime.     No current facility-administered medications on file prior to visit.    Allergies  Allergen Reactions   Other Swelling    Seaweed= swelling on arms, hands and face   Metformin And Related Nausea Only    Social History   Socioeconomic History   Marital status: Married    Spouse name: Not on file   Number of children: Not on file   Years of education: Not on file   Highest education level: Not on file  Occupational History   Not on file  Tobacco Use   Smoking status: Never    Passive exposure: Never   Smokeless tobacco: Never  Vaping Use   Vaping Use: Never used  Substance and Sexual Activity   Alcohol use: No   Drug use: No   Sexual activity: Never  Other Topics Concern   Not on file  Social History Narrative   Not on file   Social Determinants of Health   Financial Resource Strain: Low Risk  (10/05/2022)   Overall Financial Resource Strain (CARDIA)    Difficulty of Paying Living Expenses: Not hard at all  Food Insecurity: Food Insecurity Present  (10/05/2022)   Hunger Vital Sign    Worried About Running Out of Food in the Last Year: Never true    Ran Out of Food in the Last Year: Sometimes true  Transportation Needs: No Transportation Needs (10/05/2022)   PRAPARE - Administrator, Civil ServiceTransportation    Lack of Transportation (Medical): No    Lack of Transportation (Non-Medical): No  Physical Activity: Inactive (10/05/2022)   Exercise Vital Sign    Days of Exercise per Week: 0 days    Minutes of Exercise per Session: 0 min  Stress: No Stress Concern Present (10/05/2022)   [employer] - Occupational Stress Questionnaire    Feeling of Stress : Only a  little  Social Connections: Socially Integrated (10/05/2022)   Social Connection and Isolation Panel [NHANES]    Frequency of Communication with Friends and Family: More than three times a week    Frequency of Social Gatherings with Friends and Family: Once a week    Attends Religious Services: More than 4 times per year    Active Member of [employer] or Organizations: Yes    Attends Banker Meetings: More than 4 times per year    Marital Status: Married  Catering manager Violence: Not At Risk (10/05/2022)   Humiliation, Afraid, Rape, and Kick questionnaire    Fear of Current or Ex-Partner: No    Emotionally Abused: No    Physically Abused: No    Sexually Abused: No    Family History  Problem Relation Age of Onset   Kidney disease Mother    Hypertension Mother    Diabetes Father    Hypertension Father    Hypertension Paternal Aunt    Diabetes Paternal Aunt    Hypertension Paternal Uncle    Diabetes Paternal Uncle    Cancer Maternal Grandmother    Hypertension Maternal Grandfather    Diabetes Paternal Grandfather    Colon cancer Neg Hx    Breast cancer Neg Hx     Past Surgical History:  Procedure Laterality Date   ABDOMINAL AORTOGRAM W/LOWER EXTREMITY N/A 04/06/2022   Procedure: ABDOMINAL AORTOGRAM W/LOWER EXTREMITY;  Surgeon: Cephus Shelling,  MD;  Location: [facility] INVASIVE CV LAB;  Service: Cardiovascular;  Laterality: N/A;   AMPUTATION TOE Left 10/26/2017   Procedure: AMPUTATION PARTIAL RAY LEFT FOOT AND IRRIGATION/DEBRIDEMENT;  Surgeon: Felecia Shelling, DPM;  Location: [facility] OR;  Service: Podiatry;  Laterality: Left;  AMPUTATION PARTIAL RAY LEFT FOOT AND IRRIGATION/DEBRIDEMENT   CHOLECYSTECTOMY  01/24/2003   [name] 04/04/2011   COLONOSCOPY     INCISION AND DRAINAGE ABSCESS  03/17/2012   "right but; left lower abdoment"/notes 03/17/2012   LOWER EXTREMITY INTERVENTION N/A 09/10/2017   Procedure: LOWER EXTREMITY INTERVENTION;  Surgeon: Runell Gess, MD;  Location: [facility] INVASIVE CV LAB;  Service: Cardiovascular;  Laterality: N/A;   PERIPHERAL VASCULAR ATHERECTOMY  09/10/2017   Procedure: PERIPHERAL VASCULAR ATHERECTOMY;  Surgeon: Runell Gess, MD;  Location: [facility] INVASIVE CV LAB;  Service: Cardiovascular;;  left AT   PERIPHERAL VASCULAR BALLOON ANGIOPLASTY Left 04/06/2022   Procedure: PERIPHERAL VASCULAR BALLOON ANGIOPLASTY;  Surgeon: Cephus Shelling, MD;  Location: [facility] INVASIVE CV LAB;  Service: Cardiovascular;  Laterality: Left;    ROS: Review of Systems Negative except as stated above  PHYSICAL EXAM: There were no vitals taken for this visit.  Physical Exam  {female adult master:310786} {female adult master:310785}     Latest Ref Rng & Units 11/24/2022   11:19 AM 04/24/2022   11:52 AM 04/06/2022    8:07 AM  CMP  Glucose 70 - 99 mg/dL 867  619  509   BUN 8 - 27 mg/dL 16  18  14    Creatinine 0.57 - 1.00 mg/dL 3.26  7.12  4.58   Sodium 134 - 144 mmol/L 140  142  139   Potassium 3.5 - 5.2 mmol/L 4.5  3.8  3.5   Chloride 96 - 106 mmol/L 99  102  104   CO2 20 - 29 mmol/L 26  33    Calcium 8.7 - 10.3 mg/dL 9.7  9.4    Total Protein 6.0 - 8.5 g/dL 6.9  7.0    Total Bilirubin 0.0 - 1.2 mg/dL  0.5  0.6    Alkaline Phos 44 - 121 IU/L 144     AST 0 - 40 IU/L 15  10    ALT 0 - 32 IU/L 13  8     Lipid Panel     Component Value  Date/Time   CHOL 118 11/24/2022 1119   TRIG 61 11/24/2022 1119   HDL 61 11/24/2022 1119   CHOLHDL 1.9 11/24/2022 1119   CHOLHDL 3.8 09/19/2016 1452   VLDL 36 (H) 09/19/2016 1452   LDLCALC 44 11/24/2022 1119    CBC    Component Value Date/Time   WBC 4.6 04/24/2022 1152   RBC 4.29 04/24/2022 1152   HGB 12.0 04/24/2022 1152   HGB 13.1 05/03/2021 1130   HCT 37.3 04/24/2022 1152   HCT 40.3 05/03/2021 1130   PLT 211 04/24/2022 1152   PLT 235 05/03/2021 1130   MCV 86.9 04/24/2022 1152   MCV 87 05/03/2021 1130   MCH 28.0 04/24/2022 1152   MCHC 32.2 04/24/2022 1152   RDW 14.0 04/24/2022 1152   RDW 12.5 05/03/2021 1130   LYMPHSABS 1.7 04/01/2022 0132   LYMPHSABS 1.9 08/28/2018 1424   MONOABS 0.7 04/01/2022 0132   EOSABS 0.2 04/01/2022 0132   EOSABS 0.2 08/28/2018 1424   BASOSABS 0.0 04/01/2022 0132   BASOSABS 0.0 08/28/2018 1424    ASSESSMENT AND PLAN:  There are no diagnoses linked to this encounter.   Patient was given the opportunity to ask questions.  Patient verbalized understanding of the plan and was able to repeat key elements of the plan.   This documentation was completed using Paediatric nurse.  Any transcriptional errors are unintentional.  No orders of the defined types were placed in this encounter.    Requested Prescriptions    No prescriptions requested or ordered in this encounter    No follow-ups on file.  Jonah Blue, MD, FACP

## 2023-02-28 ENCOUNTER — Ambulatory Visit (AMBULATORY_SURGERY_CENTER): Payer: 59 | Admitting: Gastroenterology

## 2023-02-28 ENCOUNTER — Encounter: Payer: Self-pay | Admitting: Gastroenterology

## 2023-02-28 VITALS — BP 159/72 | HR 63 | Temp 98.0°F | Resp 17 | Ht 72.0 in | Wt 221.0 lb

## 2023-02-28 DIAGNOSIS — Z86010 Personal history of colonic polyps: Secondary | ICD-10-CM

## 2023-02-28 DIAGNOSIS — D120 Benign neoplasm of cecum: Secondary | ICD-10-CM

## 2023-02-28 DIAGNOSIS — Z09 Encounter for follow-up examination after completed treatment for conditions other than malignant neoplasm: Secondary | ICD-10-CM

## 2023-02-28 DIAGNOSIS — D123 Benign neoplasm of transverse colon: Secondary | ICD-10-CM

## 2023-02-28 MED ORDER — SODIUM CHLORIDE 0.9 % IV SOLN: 500.0000 mL | Freq: Once | INTRAVENOUS | Status: DC

## 2023-02-28 NOTE — Patient Instructions (Signed)
Impression/Recommendations:  Polyp, diverticulosis, High fiber diet, and hemorrhoid handouts given to patient.  Resume Plavix (clopidogrel) in 2 days at prior dose.  (Start on 03/02/2023).  Continue present medications. Await pathology results.  Repeat colonoscopy date to be determined after pathology results reviewed.  YOU HAD AN ENDOSCOPIC PROCEDURE TODAY AT THE [facility]:   Refer to the procedure report that was given to you for any specific questions about what was found during the examination.  If the procedure report does not answer your questions, please call your gastroenterologist to clarify.  If you requested that your care partner not be given the details of your procedure findings, then the procedure report has been included in a sealed envelope for you to review at your convenience later.  YOU SHOULD EXPECT: Some feelings of bloating in the abdomen. Passage of more gas than usual.  Walking can help get rid of the air that was put into your GI tract during the procedure and reduce the bloating. If you had a lower endoscopy (such as a colonoscopy or flexible sigmoidoscopy) you may notice spotting of blood in your stool or on the toilet paper. If you underwent a bowel prep for your procedure, you may not have a normal bowel movement for a few days.  Please Note:  You might notice some irritation and congestion in your nose or some drainage.  This is from the oxygen used during your procedure.  There is no need for concern and it should clear up in a day or so.  SYMPTOMS TO REPORT IMMEDIATELY:  Following lower endoscopy (colonoscopy or flexible sigmoidoscopy):  Excessive amounts of blood in the stool  Significant tenderness or worsening of abdominal pains  Swelling of the abdomen that is new, acute  Fever of 100F or higher For urgent or emergent issues, a gastroenterologist can be reached at any hour by calling [phone]. Do not use MyChart messaging for urgent  concerns.    DIET:  We do recommend a small meal at first, but then you may proceed to your regular diet.  Drink plenty of fluids but you should avoid alcoholic beverages for 24 hours.  ACTIVITY:  You should plan to take it easy for the rest of today and you should NOT DRIVE or use heavy machinery until tomorrow (because of the sedation medicines used during the test).    FOLLOW UP: Our staff will call the number listed on your records the next business day following your procedure.  We will call around 7:15- 8:00 am to check on you and address any questions or concerns that you may have regarding the information given to you following your procedure. If we do not reach you, we will leave a message.     If any biopsies were taken you will be contacted by phone or by letter within the next 1-3 weeks.  Please call us at [phone] if you have not heard about the biopsies in 3 weeks.    SIGNATURES/CONFIDENTIALITY: You and/or your care partner have signed paperwork which will be entered into your electronic medical record.  These signatures attest to the fact that that the information above on your After Visit Summary has been reviewed and is understood.  Full responsibility of the confidentiality of this discharge information lies with you and/or your care-partner.

## 2023-02-28 NOTE — Progress Notes (Signed)
Pt's states no medical or surgical changes since previsit or office visit. 

## 2023-02-28 NOTE — Progress Notes (Signed)
Report to PACU, RN, vss, BBS= Clear.  

## 2023-02-28 NOTE — Progress Notes (Signed)
Pt. Held in PACU until awake and alert.  Pt. HIPAA.

## 2023-02-28 NOTE — Progress Notes (Signed)
Called to room to assist during endoscopic procedure.  Patient ID and intended procedure confirmed with present staff. Received instructions for my participation in the procedure from the performing physician.  

## 2023-02-28 NOTE — Progress Notes (Signed)
See 02/27/2023 H&P, no changes

## 2023-02-28 NOTE — Op Note (Addendum)
[facility] Patient Name: Katherine RackDiane Cary Procedure Date: 02/28/2023 1:37 PM MRN: [id number] Endoscopist: Meryl DareMalcolm T Yajahira Tison , MD, [phone] Age: [age] Referring MD:  Date of Birth: 02/23/1956 Gender: Female Account #: [id number] Procedure:                Colonoscopy Indications:              Surveillance: Personal history of adenomatous                            polyps on last colonoscopy 5 years ago Medicines:                Monitored Anesthesia Care Procedure:                Pre-Anesthesia Assessment:                           - Prior to the procedure, a History and Physical                            was performed, and patient medications and                            allergies were reviewed. The patient's tolerance of                            previous anesthesia was also reviewed. The risks                            and benefits of the procedure and the sedation                            options and risks were discussed with the patient.                            All questions were answered, and informed consent                            was obtained. Prior Anticoagulants: The patient has                            taken Plavix (clopidogrel), last dose was 3 days                            prior to procedure. After reviewing the risks and                            benefits, the patient was deemed in satisfactory                            condition to undergo the procedure.                           After obtaining informed consent, the colonoscope  was passed under direct vision. Throughout the                            procedure, the patient's blood pressure, pulse, and                            oxygen saturations were monitored continuously. The                            CF [id number] #[id number] was introduced through the anus                            and advanced to the the cecum, identified by                            appendiceal orifice  and ileocecal valve. The                            ileocecal valve, appendiceal orifice, and rectum                            were photographed. The quality of the bowel                            preparation was adequate after extensive lavage,                            suction. The colonoscopy was performed without                            difficulty. The patient tolerated the procedure                            well. Scope In: 1:49:33 PM Scope Out: 2:07:21 PM Scope Withdrawal Time: 0 hours 15 minutes 15 seconds  Total Procedure Duration: 0 hours 17 minutes 48 seconds  Findings:                 The perianal and digital rectal examinations were                            normal.                           Four sessile polyps were found in the transverse                            colon, hepatic flexure and ileocecal valve. The                            polyps were 6 to 7 mm in size. These polyps were                            removed with a cold snare. Resection and retrieval  were complete.                           Multiple medium-mouthed and small-mouthed                            diverticula were found in the entire colon, left                            colon > right colon. There was no evidence of                            diverticular bleeding.                           Internal hemorrhoids were found during                            retroflexion. The hemorrhoids were small and Grade                            I (internal hemorrhoids that do not prolapse).                           The exam was otherwise without abnormality on                            direct and retroflexion views. Complications:            No immediate complications. Estimated blood loss:                            None. Estimated Blood Loss:     Estimated blood loss: none. Impression:               - Four 6 to 7 mm polyps in the transverse colon, at                             the hepatic flexure and at the ileocecal valve,                            removed with a cold snare. Resected and retrieved.                           - Moderate diverticulosis in the entire examined                            colon.                           - External hemorrhoids.                           - The examination was otherwise normal on direct  and retroflexion views. Recommendation:           - Repeat colonoscopy date to be determined after                            pending pathology results are reviewed for                            surveillance based on pathology results with a more                            extensive bowel.                           - Resume Plavix (clopidogrel) in 2 days at prior                            dose. Refer to managing physician for further                            adjustment of therapy.                           - Patient has a contact number available for                            emergencies. The signs and symptoms of potential                            delayed complications were discussed with the                            patient. Return to normal activities tomorrow.                            Written discharge instructions were provided to the                            patient.                           - High fiber diet.                           - Continue present medications.                           - Await pathology results. Meryl Dare, MD 02/28/2023 2:14:16 PM This report has been signed electronically.

## 2023-03-01 ENCOUNTER — Telehealth: Payer: Self-pay

## 2023-03-01 NOTE — Telephone Encounter (Signed)
  Follow up Call-     02/28/2023    1:06 PM  Call back number  Post procedure Call Back phone  # [phone]  Permission to leave phone message No     Patient questions:  Do you have a fever, pain , or abdominal swelling? No. Pain Score  0 *  Have you tolerated food without any problems? Yes.    Have you been able to return to your normal activities? Yes.    Do you have any questions about your discharge instructions: Diet   No. Medications  No. Follow up visit  No.  Do you have questions or concerns about your Care? No.  Actions: * If pain score is 4 or above: No action needed, pain <4.

## 2023-03-05 DIAGNOSIS — E119 Type 2 diabetes mellitus without complications: Secondary | ICD-10-CM | POA: Diagnosis not present

## 2023-03-05 DIAGNOSIS — H25813 Combined forms of age-related cataract, bilateral: Secondary | ICD-10-CM | POA: Diagnosis not present

## 2023-03-05 DIAGNOSIS — H35033 Hypertensive retinopathy, bilateral: Secondary | ICD-10-CM | POA: Diagnosis not present

## 2023-03-05 DIAGNOSIS — Z794 Long term (current) use of insulin: Secondary | ICD-10-CM | POA: Diagnosis not present

## 2023-03-05 DIAGNOSIS — H40023 Open angle with borderline findings, high risk, bilateral: Secondary | ICD-10-CM | POA: Diagnosis not present

## 2023-03-05 DIAGNOSIS — H43813 Vitreous degeneration, bilateral: Secondary | ICD-10-CM | POA: Diagnosis not present

## 2023-03-07 ENCOUNTER — Telehealth: Payer: Self-pay | Admitting: Pharmacist

## 2023-03-07 DIAGNOSIS — I152 Hypertension secondary to endocrine disorders: Secondary | ICD-10-CM

## 2023-03-07 MED ORDER — CARVEDILOL 6.25 MG PO TABS
6.2500 mg | ORAL_TABLET | Freq: Two times a day (BID) | ORAL | 2 refills | Status: DC
Start: 2023-03-07 — End: 2023-12-07

## 2023-03-07 MED ORDER — HYDROCHLOROTHIAZIDE 25 MG PO TABS
25.0000 mg | ORAL_TABLET | Freq: Every day | ORAL | 2 refills | Status: DC
Start: 2023-03-07 — End: 2023-08-14

## 2023-03-07 MED ORDER — ATORVASTATIN CALCIUM 80 MG PO TABS: 80.0000 mg | ORAL_TABLET | Freq: Every day | ORAL | 2 refills | Status: DC

## 2023-03-07 MED ORDER — LISINOPRIL 20 MG PO TABS
20.0000 mg | ORAL_TABLET | Freq: Every day | ORAL | 2 refills | Status: DC
Start: 2023-03-07 — End: 2023-06-07

## 2023-03-07 MED ORDER — AMLODIPINE BESYLATE 10 MG PO TABS
10.0000 mg | ORAL_TABLET | Freq: Every day | ORAL | 2 refills | Status: DC
Start: 2023-03-07 — End: 2023-06-07

## 2023-03-07 MED ORDER — HYDRALAZINE HCL 100 MG PO TABS
100.0000 mg | ORAL_TABLET | Freq: Three times a day (TID) | ORAL | 2 refills | Status: DC
Start: 2023-03-07 — End: 2023-12-18

## 2023-03-07 NOTE — Telephone Encounter (Signed)
Called patient to schedule an appointment for the medication management. She was identified by [facility] as having failed the diabetes control and medication adherence to antihypertensive medications measures. I was unable to reach the patient so I left a HIPAA-compliant message requesting that the patient return my call.   Of note, even though she endorsed med adherence at a PCP OV on 11/24/2022, several of her antihypertensives have not been filled by [place] since 2022. I resent rxns for her for 90-day fills. Regarding her DM, will defer to Endo as her DM is managed by them.  Butch Penny, PharmD, P[place]y [place], CPP Clinical Pharmacist [facility] [phone]

## 2023-03-12 ENCOUNTER — Encounter: Payer: Self-pay | Admitting: Gastroenterology

## 2023-04-11 DIAGNOSIS — E1165 Type 2 diabetes mellitus with hyperglycemia: Secondary | ICD-10-CM | POA: Diagnosis not present

## 2023-04-11 DIAGNOSIS — Z794 Long term (current) use of insulin: Secondary | ICD-10-CM | POA: Diagnosis not present

## 2023-04-25 DIAGNOSIS — E1165 Type 2 diabetes mellitus with hyperglycemia: Secondary | ICD-10-CM | POA: Diagnosis not present

## 2023-04-25 DIAGNOSIS — Z794 Long term (current) use of insulin: Secondary | ICD-10-CM | POA: Diagnosis not present

## 2023-05-02 ENCOUNTER — Encounter: Payer: Self-pay | Admitting: *Deleted

## 2023-05-02 NOTE — Progress Notes (Signed)
[facility] Quality Team Note  Name: Katherine [name] Date of Birth: Jul 26, 1956 MRN: [id number] Date: 05/02/2023  [facility] Quality Team has reviewed this patient's chart, please see recommendations below:  [facility] Quality Other; Pt has open gaps for AWV and A1C.  AWV not due until after 10/05/2022.  Last A1C done through [facility] 11.9 (out of range) on 02/27/2023.  Pt has ov 06/08/23.  Would provider mind doing another A1C at that visit?  If compliant, would be able to close gap.

## 2023-06-07 ENCOUNTER — Ambulatory Visit (INDEPENDENT_AMBULATORY_CARE_PROVIDER_SITE_OTHER): Payer: 59 | Admitting: Cardiovascular Disease

## 2023-06-07 ENCOUNTER — Encounter (HOSPITAL_BASED_OUTPATIENT_CLINIC_OR_DEPARTMENT_OTHER): Payer: Self-pay | Admitting: Cardiovascular Disease

## 2023-06-07 VITALS — BP 113/66 | HR 64 | Ht 72.0 in | Wt 213.0 lb

## 2023-06-07 DIAGNOSIS — E785 Hyperlipidemia, unspecified: Secondary | ICD-10-CM | POA: Diagnosis not present

## 2023-06-07 DIAGNOSIS — Z5181 Encounter for therapeutic drug level monitoring: Secondary | ICD-10-CM

## 2023-06-07 DIAGNOSIS — E1159 Type 2 diabetes mellitus with other circulatory complications: Secondary | ICD-10-CM

## 2023-06-07 DIAGNOSIS — Z794 Long term (current) use of insulin: Secondary | ICD-10-CM

## 2023-06-07 DIAGNOSIS — I152 Hypertension secondary to endocrine disorders: Secondary | ICD-10-CM

## 2023-06-07 DIAGNOSIS — I701 Atherosclerosis of renal artery: Secondary | ICD-10-CM

## 2023-06-07 DIAGNOSIS — I10 Essential (primary) hypertension: Secondary | ICD-10-CM | POA: Diagnosis not present

## 2023-06-07 MED ORDER — AMLODIPINE BESYLATE 5 MG PO TABS
5.0000 mg | ORAL_TABLET | Freq: Every day | ORAL | 3 refills | Status: DC
Start: 2023-06-07 — End: 2023-08-14

## 2023-06-07 MED ORDER — LISINOPRIL 40 MG PO TABS
40.0000 mg | ORAL_TABLET | Freq: Every day | ORAL | 3 refills | Status: DC
Start: 2023-06-07 — End: 2023-08-14

## 2023-06-07 NOTE — Patient Instructions (Addendum)
Medication Instructions:  DECREASE YOUR AMLODIPINE TO 5 MG DAILY   INCREASE YOUR LISINOPRIL TO 40 MG DAILY   Labwork: BMET IN 1 WEEK   Testing/Procedures: NONE  Follow-Up: 08/14/2023 3:00 pm with Dr Duke Salvia   Any Other Special Instructions Will Be Listed Below (If Applicable). MONITOR YOUR BLOOD PRESSURES AND BRING READINGS TO FOLLOW UP   If you need a refill on your cardiac medications before your next appointment, please call your pharmacy.

## 2023-06-07 NOTE — Progress Notes (Signed)
[facility] Follow-up:    Date:  06/07/2023   ID:  Katherine [name], DOB [birth year], MRN [id number]  PCP:  Marcine Matar, MD  Cardiologist:  None  Nephrologist:  Referring MD: Marcine Matar, MD   CC: Hypertension  History of Present Illness:    Katherine [name] is a 67 y.o. female with a hx of PAD, toe ulcer s/p amputation LT 1st toe, hypertension, hyperlipidemia, type 2 diabetes mellitus with neuropathy, retinopathy and possible gastroparesis, diabetic ketoacidoses, anemia, and depression, here for follow-up. She was initially seen 09/2022 in the [facility]. She previously saw Dr. Allyson Sabal 02/2021. He did peripheral angiography 08/2017 that revealed an occluded left anterior tibial and posterior tibial artery with a patent peroneal. He was able to recanalize the anterior tibial down to her foot. Her last ABIs 04/2022 were normal bilaterally.  She saw her PCP Dr. Laural Benes 10/05/2022 and her blood pressure was 160/84. She was compliant with Coreg 6.25 mg BID, amlodipine 10 mg daily, hydralazine 100 mg TID, HCTZ 25 mg daily, and lisinopril 20 mg daily. She was not monitoring home blood pressures. Given her uncontrolled blood pressure on multiple agents she was referred to the [facility] for further evaluation and management.  At her initial visit, she reported home blood pressures higher than her in-office reading 124/51. She wasn't formally exercising much due to LE pain. Wasn't checking home BP as she felt her cuff was inaccurate. She was provided with a prescription and information about where to get a BP cuff. Continued lisinopril, carvedilol, and amlodipine. TSH, cortisol, renin and aldosterone levels were normal. Renal artery dopplers 10/2022 revealed 1-59% stenosis bilaterally. A murmur was noted on exam and she had lower extremity edema. Echocardiogram 11/2022 showed LVEF 60-65%, moderate LVH, elevated left ventricular end-diastolic  pressure, trivial mitral and aortic regurgitation.  Today, she reports struggling with her LE pain, much worse in her left side. Sometimes while lying down her pain will be in the right leg as well. Expresses interest in seeing a specialist for her neuropathy to explore other therapeutic options besides gabapentin. Using cane for assistance. She is not able to complete much formal exercise. She has tried to walk along her driveway for exercise, but has developed fatigue and left leg swelling improved with elevation. In the office today her blood pressure is 113/66; she is not monitoring her blood pressures at home. In the mornings she takes 3 of her medications, then the rest at night. Feels very groggy and dizzy if she takes all of her antihypertensives at once. She denies any palpitations, chest pain, shortness of breath, lightheadedness, headaches, syncope, orthopnea, or PND.   Past Medical History:  Diagnosis Date   Anemia    requiring transfusion   Boil of buttock ~ 2016   Depression    Diabetic neuropathy (HCC)    [name] 08/23/2017   DKA (diabetic ketoacidoses)    recent/notes 01/06/2015   Hyperlipidemia    Hypertension    Murmur 10/19/2022   Neuropathy    Peripheral vascular disease (HCC)    Toe ulcer (HCC)    left great toe/notes 08/23/2017   Type II diabetes mellitus (HCC)     Past Surgical History:  Procedure Laterality Date   ABDOMINAL AORTOGRAM W/LOWER EXTREMITY N/A 04/06/2022   Procedure: ABDOMINAL AORTOGRAM W/LOWER EXTREMITY;  Surgeon: Cephus Shelling, MD;  Location: [facility];  Service: Cardiovascular;  Laterality: N/A;   AMPUTATION TOE Left 10/26/2017   Procedure:  AMPUTATION PARTIAL RAY LEFT FOOT AND IRRIGATION/DEBRIDEMENT;  Surgeon: Felecia Shelling, DPM;  Location: [facility] OR;  Service: Podiatry;  Laterality: Left;  AMPUTATION PARTIAL RAY LEFT FOOT AND IRRIGATION/DEBRIDEMENT   CHOLECYSTECTOMY  01/24/2003   [name] 04/04/2011   COLONOSCOPY     INCISION AND DRAINAGE  ABSCESS  03/17/2012   "right but; left lower abdoment"/notes 03/17/2012   LOWER EXTREMITY INTERVENTION N/A 09/10/2017   Procedure: LOWER EXTREMITY INTERVENTION;  Surgeon: Runell Gess, MD;  Location: [facility];  Service: Cardiovascular;  Laterality: N/A;   PERIPHERAL VASCULAR ATHERECTOMY  09/10/2017   Procedure: PERIPHERAL VASCULAR ATHERECTOMY;  Surgeon: Runell Gess, MD;  Location: [facility] INV[facility]IVE CV LAB;  Service: Cardiovascular;;  left AT   PERIPHERAL VASCULAR BALLOON ANGIOPLASTY Left 04/06/2022   Procedure: PERIPHERAL VASCULAR BALLOON ANGIOPLASTY;  Surgeon: Cephus Shelling, MD;  Location: [facility];  Service: Cardiovascular;  Laterality: Left;    Current Medications: Current Meds  Medication Sig   atorvastatin (LIPITOR) 80 MG tablet Take 1 tablet (80 mg total) by mouth daily at 6 PM.   Blood Pressure KIT Check blood pressure twice a day Dx: I10, hypertension   Carboxymethylcellulose Sodium (EYE DROPS OP) Place 1 drop into both eyes daily.   carvedilol (COREG) 6.25 MG tablet Take 1 tablet (6.25 mg total) by mouth 2 (two) times daily with a meal.   clopidogrel (PLAVIX) 75 MG tablet TAKE 1 TABLET BY MOUTH EVERY DAY WITH BREAKFAST   Continuous Blood Gluc Receiver (FREESTYLE LIBRE 2 READER) DEVI Use as directed 3 times daily   Continuous Blood Gluc Receiver (FREESTYLE LIBRE 2 READER) DEVI Scan as needed for continuous glucose monitoring.   Continuous Blood Gluc Sensor (FREESTYLE LIBRE 2 SENSOR) MISC Change sensor every 2 weeks.   Continuous Blood Gluc Sensor (FREESTYLE LIBRE 2 SENSOR) MISC Inject 1 sensor to the skin every 14 days for continuous glucose monitoring.   gabapentin (NEURONTIN) 600 MG tablet Take 1 tablet (600 mg total) by mouth 2 (two) times daily. (Patient taking differently: Take 650 mg by mouth 2 (two) times daily.)   glucose blood (ONETOUCH VERIO) test strip 1 each by Misc.(Non-Drug; Combo Route) route 4 times daily before meals and nightly.    hydrALAZINE (APRESOLINE) 100 MG tablet Take 1 tablet (100 mg total) by mouth 3 (three) times daily.   hydrochlorothiazide (HYDRODIURIL) 25 MG tablet Take 1 tablet (25 mg total) by mouth daily.   insulin aspart (NOVOLOG) 100 UNIT/ML FlexPen Inject 10-17 units into the skin 3 times daily before meals. (Patient taking differently: Inject 10 Units into the skin 3 (three) times daily with meals.)   Lancets (ONETOUCH DELICA PLUS LANCET33G) MISC Use 4 times daily before meals and nightly.   Multiple Vitamins-Minerals (MULTIVITAMIN ADULT) CHEW Chew 2 tablets by mouth daily.   mupirocin cream (BACTROBAN) 2 % Apply topically daily. Apply to left foot wound and cover with dry dressing once daily   RESTASIS 0.05 % ophthalmic emulsion Place 1 drop into both eyes 2 (two) times daily.   Semaglutide,0.25 or 0.5MG /DOS, (OZEMPIC, 0.25 OR 0.5 MG/DOSE,) 2 MG/3ML SOPN Inject into the skin.   TOUJEO SOLOSTAR 300 UNIT/ML Solostar Pen Inject 80 Units into the skin nightly.   traMADol (ULTRAM) 50 MG tablet Take 1 tablet (50 mg total) by mouth every 12 (twelve) hours as needed.   TRUEPLUS PEN NEEDLES 31G X 5 MM MISC Use to inject Toujeo once daily.   VITAMIN D PO Take 2 tablets by mouth at bedtime.   [  DISCONTINUED] amLODipine (NORVASC) 10 MG tablet Take 1 tablet (10 mg total) by mouth daily.   [DISCONTINUED] lisinopril (ZESTRIL) 20 MG tablet Take 1 tablet (20 mg total) by mouth daily.     Allergies:   Other and Metformin and related   Social History   Socioeconomic History   Marital status: Married    Spouse name: Not on file   Number of children: Not on file   Years of education: Not on file   Highest education level: Not on file  Occupational History   Not on file  Tobacco Use   Smoking status: Never    Passive exposure: Never   Smokeless tobacco: Never  Vaping Use   Vaping status: Never Used  Substance and Sexual Activity   Alcohol use: No   Drug use: No   Sexual activity: Never  Other Topics Concern    Not on file  Social History Narrative   Not on file   Social Determinants of Health   Financial Resource Strain: Low Risk  (10/05/2022)   Overall Financial Resource Strain (CARDIA)    Difficulty of Paying Living Expenses: Not hard at all  Food Insecurity: Food Insecurity Present (10/05/2022)   Hunger Vital Sign    Worried About Running Out of Food in the Last Year: Never true    Ran Out of Food in the Last Year: Sometimes true  Transportation Needs: No Transportation Needs (10/05/2022)   PRAPARE - Administrator, Civil Service (Medical): No    Lack of Transportation (Non-Medical): No  Physical Activity: Inactive (10/05/2022)   Exercise Vital Sign    Days of Exercise per Week: 0 days    Minutes of Exercise per Session: 0 min  Stress: No Stress Concern Present (10/05/2022)   [employer] of Occupational Health - Occupational Stress Questionnaire    Feeling of Stress : Only a little  Social Connections: Socially Integrated (10/05/2022)   Social Connection and Isolation Panel [NHANES]    Frequency of Communication with Friends and Family: More than three times a week    Frequency of Social Gatherings with Friends and Family: Once a week    Attends Religious Services: More than 4 times per year    Active Member of [employer] or Organizations: Yes    Attends Engineer, structural: More than 4 times per year    Marital Status: Married     Family History: The patient's family history includes Cancer in her maternal grandmother; Diabetes in her father, paternal aunt, paternal grandfather, and paternal uncle; Hypertension in her father, maternal grandfather, mother, paternal aunt, and paternal uncle; Kidney disease in her mother. There is no history of Colon cancer or Breast cancer.  ROS:   Please see the history of present illness.    (+) LE and foot pain L>R (+) LLE edema (+) Fatigue All other systems reviewed and are negative.  EKGs/Labs/Other Studies  Reviewed:    Echocardiogram  11/23/2022:  1. Left ventricular ejection fraction, by estimation, is 60 to 65%. Left  ventricular ejection fraction by 3D volume is 59 %. The left ventricle has  normal function. The left ventricle has no regional wall motion  abnormalities. There is moderate  concentric left ventricular hypertrophy. Left ventricular diastolic  parameters are indeterminate. Elevated left ventricular end-diastolic  pressure.   2. Right ventricular systolic function is normal. The right ventricular  size is mildly enlarged. Tricuspid regurgitation signal is inadequate for  assessing PA pressure.   3. Right atrial  size was mildly dilated.   4. The mitral valve is normal in structure. Trivial mitral valve  regurgitation. No evidence of mitral stenosis.   5. The aortic valve is normal in structure. Aortic valve regurgitation is  trivial. No aortic stenosis is present.   6. The inferior vena cava is normal in size with greater than 50%  respiratory variability, suggesting right atrial pressure of 3 mmHg.   Bilateral Renal Artery Dopplers  11/15/2022: Summary:  Renal:    Right: 1-59% stenosis of the right renal artery. RRV flow present.         Abnormal right Resistive Index. Normal cortical thickness of         right kidney. Normal size right kidney.  Left:  1-59% stenosis of the left renal artery. LRV flow present.         Normal cortical thickness of the left kidney. Normal size of         left kidney. Abnormal left Resisitve Index.   ABI dopplers  11/07/2022: Bilateral ABIs and TBIs appear essentially unchanged compared to prior  study on 05/16/2022.    Summary:  Right: Resting right ankle-brachial index is within normal range. The  right toe-brachial index is abnormal.   Left: Resting left ankle-brachial index is within normal range.   LE Venous Doppler  08/07/2022: Summary:  Left:  - No evidence of deep vein thrombosis seen in the left lower extremity,  from the  common femoral through the popliteal veins.  - No evidence of superficial venous thrombosis in the left lower  extremity.  - No evidence of superficial venous reflux seen in the left short  saphenous vein.  - Venous reflux is noted in the left greater saphenous vein in the thigh.   ABI  05/16/2022: Right ABIs appear essentially unchanged. Left ABIs appear increased  compared to prior study on 04/01/2022.    Summary:  Right: Resting right ankle-brachial index is within normal range. No  evidence of significant right lower extremity arterial disease. The right  toe-brachial index is abnormal.   Left: Resting left ankle-brachial index is within normal range. No  evidence of significant left lower extremity arterial disease.   LE Arterial Duplex Study  05/16/2022: Summary:  Left: Widely patent left lower extremity arterial system.  Biphasic waveforms observed in the mid and distal anterior tibial artery.   Abdominal Aortogram with Peripheral Vascular Angioplasty  04/06/2022: Pre-operative Diagnosis: Left fifth toe osteomyelitis with known peripheral arterial disease Post-operative diagnosis:  Same Surgeon:  Cephus Shelling, MD Procedure Performed: 1.  Ultrasound-guided access right common femoral artery 2.  Aortogram with catheter selection of aorta 3.  Left lower extremity arteriogram with selection of third order branches 4.  Left anterior tibial artery angioplasty (3 mm x 100 mm Sterling) 5.  Mynx closure of the right common femoral artery 6.  57 minutes of monitored moderate conscious sedation time  Findings:  Aortogram showed patent renal arteries bilaterally with no flow-limiting stenosis in the aortoiliac segment.  The left common femoral, profunda, SFA, popliteal arteries are all widely patent.  She has posterior tibial artery occlusion.  Dominant runoff is through the anterior tibial and peroneal.  The peroneal is patent but diseased distally at the ankle and it splits into  two branches.  The anterior tibial had two moderate stenosis >60% in the midportion of the vessel.  Ultimately the anterior tibial artery stenosis was crossed antegrade and angioplastied with a 3 mm Sterling.  No significant residual stenosis.  Preserved runoff and optimized from a vascular surgery standpoint.  Plan: Continue Plavix statin.  We will arrange follow-up in 1 month with arterial duplex.  Optimized for toe amputation.   EKG:  EKG is personally reviewed. 06/07/2023:  Not ordered. 10/19/2022: Sinus rhythm. Rate 73 bpm.  Recent Labs: 10/31/2022: TSH 1.120 11/24/2022: ALT 13; BUN 16; Creatinine, Ser 0.88; Potassium 4.5; Sodium 140   Recent Lipid Panel    Component Value Date/Time   CHOL 118 11/24/2022 1119   TRIG 61 11/24/2022 1119   HDL 61 11/24/2022 1119   CHOLHDL 1.9 11/24/2022 1119   CHOLHDL 3.8 09/19/2016 1452   VLDL 36 (H) 09/19/2016 1452   LDLCALC 44 11/24/2022 1119    Physical Exam:    VS:  BP 113/66 (BP Location: Left Arm, Patient Position: Sitting, Cuff Size: Large)   Pulse 64   Ht 6' (1.829 m)   Wt 213 lb (96.6 kg)   SpO2 96%   BMI 28.89 kg/m  , BMI Body mass index is 28.89 kg/m. GENERAL:  Well appearing HEENT: Pupils equal round and reactive, fundi not visualized, oral mucosa unremarkable NECK:  No jugular venous distention, waveform within normal limits, carotid upstroke brisk and symmetric, no bruits, no thyromegaly LUNGS:  Clear to auscultation bilaterally HEART:  RRR.  PMI not displaced or sustained,S1 and S2 within normal limits, no S3, no S4, no clicks, no rubs, II/VI systolic murmur at the LUSB ABD:  Flat, positive bowel sounds normal in frequency in pitch, no bruits, no rebound, no guarding, no midline pulsatile mass, no hepatomegaly, no splenomegaly EXT:  2 plus pulses throughout, 1+ bilateral non-pitting edema, no cyanosis no clubbing SKIN:  No rashes no nodules NEURO:  Cranial nerves II through XII grossly intact, motor grossly intact  throughout PSYCH:  Cognitively intact, oriented to person place and time  ASSESSMENT/PLAN:       # Hypertension: Blood pressure well-controlled at current visit (113/66). Patient reports difficulty with medication regimen due to LE edema.  We wil reduce amlodipine to 5mg  .  Increase lisinopril to 40mg .  -Encourage home blood pressure monitoring and documentation. -Continue carvedilol, hydralazine and HCTZ.     Lower Extremity Edema: Predominantly left-sided, possibly related to Amlodipine use. Patient reports improvement with elevation. -Reduce Amlodipine from 10mg  to 5mg  daily. -Increase Lisinopril from 20mg  to 40mg  daily. -Advise patient to monitor for symptoms of hypotension and to report any such symptoms promptly.  # Hyperlipidemia:  Lipids are well-controlled on atorvastatin.    # PAD:  s/p L toe amputation.  Continue working on glucose control.  Continue satin and clopidogrel.   General Health Maintenance: -Encourage increased physical activity as tolerated, such as walking or cycling at the local [employer]. -Schedule follow-up in two months to reassess blood pressure control and lower extremity edema. -Advise patient to discuss neuropathic pain and appetite changes with primary care provider.        Screening for Secondary Hypertension:     10/19/2022   11:23 AM  Causes  Drugs/Herbals Screened     - Comments limits salt.  Does eat out some. Daily coffee x2.No EtOH.  No NSAIDS  Renovascular HTN Screened     - Comments check renal artery Doppler  Sleep Apnea Screened     - Comments Snores, AM fatigue, daytime somnolence.  Check sleep study  Thyroid Disease Screened     - Comments Check TSh  Hyperaldosteronism Screened     - Comments Check renin and aldosterone  Pheochromocytoma N/A  Cushing's Syndrome Screened     - Comments check AM cortisol    Relevant Labs/Studies:    Latest Ref Rng & Units 11/24/2022   11:19 AM 04/24/2022   11:52 AM 04/06/2022    8:07 AM   Basic Labs  Sodium 134 - 144 mmol/L 140  142  139   Potassium 3.5 - 5.2 mmol/L 4.5  3.8  3.5   Creatinine 0.57 - 1.00 mg/dL 1.61  0.96  0.45        Latest Ref Rng & Units 10/31/2022   11:06 AM 08/28/2018    2:24 PM  Thyroid   TSH 0.450 - 4.500 uIU/mL 1.120  1.370        Latest Ref Rng & Units 10/31/2022   11:06 AM  Renin/Aldosterone   Aldosterone 0.0 - 30.0 ng/dL 6.6   Aldos/Renin Ratio 0.0 - 30.0 26.0              11/15/2022   12:32 PM  Renovascular   Renal Artery [place] Completed Yes    Disposition:    FU with Harly Pipkins C. Duke Salvia, MD, [facility] in 2 months.  Medication Adjustments/Labs and Tests Ordered: Current medicines are reviewed at length with the patient today.  Concerns regarding medicines are outlined above.   Orders Placed This Encounter  Procedures   Basic metabolic panel   Meds ordered this encounter  Medications   lisinopril (ZESTRIL) 40 MG tablet    Sig: Take 1 tablet (40 mg total) by mouth daily.    Dispense:  90 tablet    Refill:  3    Dose increase, D/C 20 MG RX   amLODipine (NORVASC) 5 MG tablet    Sig: Take 1 tablet (5 mg total) by mouth daily.    Dispense:  90 tablet    Refill:  3    D/C 10 MG RX   I,Mathew Stumpf,acting as a scribe for Chilton Si, MD.,have documented all relevant documentation on the behalf of Chilton Si, MD,as directed by  Chilton Si, MD while in the presence of Chilton Si, MD.  I, Amelya Mabry C. Duke Salvia, MD have reviewed all documentation for this visit.  The documentation of the exam, diagnosis, procedures, and orders on 06/07/2023 are all accurate and complete.  Signed, Chilton Si, MD  06/07/2023 3:56 PM    [facility]

## 2023-06-08 ENCOUNTER — Encounter: Payer: Self-pay | Admitting: Internal Medicine

## 2023-06-08 ENCOUNTER — Ambulatory Visit: Payer: 59 | Attending: Internal Medicine | Admitting: Internal Medicine

## 2023-06-08 VITALS — BP 183/76 | HR 67 | Temp 97.9°F | Ht 72.0 in | Wt 214.0 lb

## 2023-06-08 DIAGNOSIS — G629 Polyneuropathy, unspecified: Secondary | ICD-10-CM

## 2023-06-08 DIAGNOSIS — E1122 Type 2 diabetes mellitus with diabetic chronic kidney disease: Secondary | ICD-10-CM | POA: Diagnosis not present

## 2023-06-08 DIAGNOSIS — E1169 Type 2 diabetes mellitus with other specified complication: Secondary | ICD-10-CM | POA: Diagnosis not present

## 2023-06-08 DIAGNOSIS — Z794 Long term (current) use of insulin: Secondary | ICD-10-CM

## 2023-06-08 DIAGNOSIS — E1142 Type 2 diabetes mellitus with diabetic polyneuropathy: Secondary | ICD-10-CM

## 2023-06-08 DIAGNOSIS — Z7985 Long-term (current) use of injectable non-insulin antidiabetic drugs: Secondary | ICD-10-CM

## 2023-06-08 DIAGNOSIS — E1165 Type 2 diabetes mellitus with hyperglycemia: Secondary | ICD-10-CM | POA: Diagnosis not present

## 2023-06-08 DIAGNOSIS — E1151 Type 2 diabetes mellitus with diabetic peripheral angiopathy without gangrene: Secondary | ICD-10-CM | POA: Diagnosis not present

## 2023-06-08 DIAGNOSIS — E11649 Type 2 diabetes mellitus with hypoglycemia without coma: Secondary | ICD-10-CM | POA: Diagnosis not present

## 2023-06-08 DIAGNOSIS — E785 Hyperlipidemia, unspecified: Secondary | ICD-10-CM | POA: Diagnosis not present

## 2023-06-08 DIAGNOSIS — I152 Hypertension secondary to endocrine disorders: Secondary | ICD-10-CM | POA: Diagnosis not present

## 2023-06-08 DIAGNOSIS — E1159 Type 2 diabetes mellitus with other circulatory complications: Secondary | ICD-10-CM | POA: Diagnosis not present

## 2023-06-08 DIAGNOSIS — E1143 Type 2 diabetes mellitus with diabetic autonomic (poly)neuropathy: Secondary | ICD-10-CM | POA: Diagnosis not present

## 2023-06-08 DIAGNOSIS — E113299 Type 2 diabetes mellitus with mild nonproliferative diabetic retinopathy without macular edema, unspecified eye: Secondary | ICD-10-CM | POA: Diagnosis not present

## 2023-06-08 DIAGNOSIS — N182 Chronic kidney disease, stage 2 (mild): Secondary | ICD-10-CM | POA: Diagnosis not present

## 2023-06-08 DIAGNOSIS — I129 Hypertensive chronic kidney disease with stage 1 through stage 4 chronic kidney disease, or unspecified chronic kidney disease: Secondary | ICD-10-CM | POA: Diagnosis not present

## 2023-06-08 DIAGNOSIS — Z8631 Personal history of diabetic foot ulcer: Secondary | ICD-10-CM | POA: Diagnosis not present

## 2023-06-08 DIAGNOSIS — E1140 Type 2 diabetes mellitus with diabetic neuropathy, unspecified: Secondary | ICD-10-CM | POA: Diagnosis not present

## 2023-06-08 LAB — POCT GLYCOSYLATED HEMOGLOBIN (HGB A1C): HbA1c, POC (controlled diabetic range): 8.9 % — AB (ref 0.0–7.0)

## 2023-06-08 LAB — GLUCOSE, POCT (MANUAL RESULT ENTRY): POC Glucose: 176 mg/dl — AB (ref 70–99)

## 2023-06-08 MED ORDER — TOUJEO SOLOSTAR 300 UNIT/ML ~~LOC~~ SOPN: 70.0000 [IU] | PEN_INJECTOR | Freq: Every day | SUBCUTANEOUS | Status: DC

## 2023-06-08 NOTE — Progress Notes (Signed)
Patient ID: CLOA BUSHONG, female    DOB: 1956-04-08  MRN: [id number]  CC: Diabetes (DM f/u. Med refills. /Ongoing pain in L & R leg radiating to L & R feet. )   Subjective: Katherine [name] is a 67 y.o. female who presents for chronic ds management Her concerns today include:  Hx of HTN, DM with neuropathy, retinopathy and possible gastroparesis, HL, depression, GERD, PAD ( left anterior tibial angioplasty 03/2022 for left fifth toe osteomyelitis), s/p amputation LT 1st toe, BL RAS 59%.    HTN:  current antihypertensive including lisinopril 40 mg daily, carvedilol 6.25 mg BID, Hydralazine 100 mg TID, HCTZ 25 mg daily and amlodipine 5 mg daily.  Norvasc decreased to 5 mg by Dr. Duke Salvia to help dec LE edema -Only took Norvasc and Coreg so far today; has been back home as yet for today to take others Not checking BP but has device Limits salt in foods  DM: Results for orders placed or performed in visit on 06/08/23  POCT glucose (manual entry)  Result Value Ref Range   POC Glucose 176 (A) 70 - 99 mg/dl  POCT glycosylated hemoglobin (Hb A1C)  Result Value Ref Range   Hemoglobin A1C     HbA1c POC (<> result, manual entry)     HbA1c, POC (prediabetic range)     HbA1c, POC (controlled diabetic range) 8.9 (A) 0.0 - 7.0 %  Saw her endocrinologist today at [facility].  Her A1C was 8.7 on that visit today.  A1C in April was 9.9.  Started on Ozempic in April.  Dose increase to 1 mg today,  Toujeo decreased 70 units and Novolog 15 units TID Has loss 10 lbs so far since Ozempic. Loves white carbs No foot ulcers. Healed.  Last saw podiatrist April Wants referral to Pain Management for neuropathy.  On Gabapentin 600 mg BID.  Tramadol use to help in combo with Gabapentin but not any more Patient Active Problem List   Diagnosis Date Noted   Mild major depression (HCC) 11/24/2022   Murmur 10/19/2022   Medication monitoring encounter 04/10/2022   Cellulitis of left lower extremity    Neuropathy 12/08/2021    Diabetic gastroparesis associated with type 2 diabetes mellitus (HCC) 07/30/2021   Type 2 diabetes mellitus with diabetic peripheral angiopathy without gangrene, with long-term current use of insulin (HCC) 07/30/2021   Amputation of left great toe (HCC) 06/13/2021   Microalbuminuria due to type 2 diabetes mellitus (HCC) 05/04/2021   Diabetes (HCC) 12/22/2019   Renal artery stenosis (HCC) 12/01/2019   Diabetic polyneuropathy associated with type 2 diabetes mellitus (HCC) 05/14/2018   Thyroid nodule 10/04/2017   Microcytic anemia 09/11/2017   Critical lower limb ischemia (HCC) 09/10/2017   Retinopathy 08/22/2017   Thyroid enlargement 07/12/2017   Dyslipidemia 09/20/2016   DEPRESSION 09/10/2009   LEG CRAMPS 05/19/2009   GERD 02/15/2009   Gastroparesis 09/03/2008   DENTAL CARIES 07/14/2008   Essential hypertension 09/16/2003     Current Outpatient Medications on File Prior to Visit  Medication Sig Dispense Refill   amLODipine (NORVASC) 5 MG tablet Take 1 tablet (5 mg total) by mouth daily. 90 tablet 3   atorvastatin (LIPITOR) 80 MG tablet Take 1 tablet (80 mg total) by mouth daily at 6 PM. 90 tablet 2   BAQSIMI ONE PACK 3 MG/DOSE POWD Place 1 spray into the nose as needed (Low blood sugar).     Blood Pressure KIT Check blood pressure twice a day Dx: I10, hypertension  1 kit 0   Carboxymethylcellulose Sodium (EYE DROPS OP) Place 1 drop into both eyes daily.     carvedilol (COREG) 6.25 MG tablet Take 1 tablet (6.25 mg total) by mouth 2 (two) times daily with a meal. 180 tablet 2   clopidogrel (PLAVIX) 75 MG tablet TAKE 1 TABLET BY MOUTH EVERY DAY WITH BREAKFAST 90 tablet 3   Continuous Blood Gluc Receiver (FREESTYLE LIBRE 2 READER) DEVI Use as directed 3 times daily 1 each 12   Continuous Blood Gluc Receiver (FREESTYLE LIBRE 2 READER) DEVI Scan as needed for continuous glucose monitoring. 1 each 0   Continuous Blood Gluc Sensor (FREESTYLE LIBRE 2 SENSOR) MISC Change sensor every 2 weeks.  2 each 12   Continuous Blood Gluc Sensor (FREESTYLE LIBRE 2 SENSOR) MISC Inject 1 sensor to the skin every 14 days for continuous glucose monitoring. 2 each 11   gabapentin (NEURONTIN) 600 MG tablet Take 1 tablet (600 mg total) by mouth 2 (two) times daily. (Patient taking differently: Take 650 mg by mouth 2 (two) times daily.) 60 tablet 6   glucose blood (ONETOUCH VERIO) test strip 1 each by Misc.(Non-Drug; Combo Route) route 4 times daily before meals and nightly. 400 each 3   hydrALAZINE (APRESOLINE) 100 MG tablet Take 1 tablet (100 mg total) by mouth 3 (three) times daily. 270 tablet 2   hydrochlorothiazide (HYDRODIURIL) 25 MG tablet Take 1 tablet (25 mg total) by mouth daily. 90 tablet 2   insulin aspart (NOVOLOG) 100 UNIT/ML FlexPen Inject 10-17 units into the skin 3 times daily before meals. (Patient taking differently: Inject 10 Units into the skin 3 (three) times daily with meals.) 45 mL 3   Lancets (ONETOUCH DELICA PLUS LANCET33G) MISC Use 4 times daily before meals and nightly. 400 each 3   lisinopril (ZESTRIL) 40 MG tablet Take 1 tablet (40 mg total) by mouth daily. 90 tablet 3   Multiple Vitamins-Minerals (MULTIVITAMIN ADULT) CHEW Chew 2 tablets by mouth daily.     mupirocin cream (BACTROBAN) 2 % Apply topically daily. Apply to left foot wound and cover with dry dressing once daily 30 g 0   RESTASIS 0.05 % ophthalmic emulsion Place 1 drop into both eyes 2 (two) times daily.     Semaglutide, 1 MG/DOSE, (OZEMPIC, 1 MG/DOSE,) 4 MG/3ML SOPN Inject 1 mg into the skin once a week.     traMADol (ULTRAM) 50 MG tablet Take 1 tablet (50 mg total) by mouth every 12 (twelve) hours as needed. 14 tablet 1   TRUEPLUS PEN NEEDLES 31G X 5 MM MISC Use to inject Toujeo once daily. 100 each 2   VITAMIN D PO Take 2 tablets by mouth at bedtime.     No current facility-administered medications on file prior to visit.    Allergies  Allergen Reactions   Other Swelling    Seaweed= swelling on arms, hands  and face   Metformin And Related Nausea Only    Social History   Socioeconomic History   Marital status: Married    Spouse name: Not on file   Number of children: Not on file   Years of education: Not on file   Highest education level: Not on file  Occupational History   Not on file  Tobacco Use   Smoking status: Never    Passive exposure: Never   Smokeless tobacco: Never  Vaping Use   Vaping status: Never Used  Substance and Sexual Activity   Alcohol use: No   Drug use:  No   Sexual activity: Never  Other Topics Concern   Not on file  Social History Narrative   Not on file   Social Determinants of Health   Financial Resource Strain: Low Risk  (10/05/2022)   Overall Financial Resource Strain (CARDIA)    Difficulty of Paying Living Expenses: Not hard at all  Food Insecurity: Food Insecurity Present (10/05/2022)   Hunger Vital Sign    Worried About Running Out of Food in the Last Year: Never true    Ran Out of Food in the Last Year: Sometimes true  Transportation Needs: No Transportation Needs (10/05/2022)   PRAPARE - Administrator, Civil Service (Medical): No    Lack of Transportation (Non-Medical): No  Physical Activity: Inactive (10/05/2022)   Exercise Vital Sign    Days of Exercise per Week: 0 days    Minutes of Exercise per Session: 0 min  Stress: No Stress Concern Present (10/05/2022)   [employer] of Occupational Health - Occupational Stress Questionnaire    Feeling of Stress : Only a little  Social Connections: Socially Integrated (10/05/2022)   Social Connection and Isolation Panel [NHANES]    Frequency of Communication with Friends and Family: More than three times a week    Frequency of Social Gatherings with Friends and Family: Once a week    Attends Religious Services: More than 4 times per year    Active Member of [employer] or Organizations: Yes    Attends Banker Meetings: More than 4 times per year    Marital Status:  Married  Catering manager Violence: Not At Risk (10/05/2022)   Humiliation, Afraid, Rape, and Kick questionnaire    Fear of Current or Ex-Partner: No    Emotionally Abused: No    Physically Abused: No    Sexually Abused: No    Family History  Problem Relation Age of Onset   Kidney disease Mother    Hypertension Mother    Diabetes Father    Hypertension Father    Hypertension Paternal Aunt    Diabetes Paternal Aunt    Hypertension Paternal Uncle    Diabetes Paternal Uncle    Cancer Maternal Grandmother    Hypertension Maternal Grandfather    Diabetes Paternal Grandfather    Colon cancer Neg Hx    Breast cancer Neg Hx     Past Surgical History:  Procedure Laterality Date   ABDOMINAL AORTOGRAM W/LOWER EXTREMITY N/A 04/06/2022   Procedure: ABDOMINAL AORTOGRAM W/LOWER EXTREMITY;  Surgeon: Cephus Shelling, MD;  Location: [facility]IVE [facility];  Service: Cardiovascular;  Laterality: N/A;   AMPUTATION TOE Left 10/26/2017   Procedure: AMPUTATION PARTIAL RAY LEFT FOOT AND IRRIGATION/DEBRIDEMENT;  Surgeon: Felecia Shelling, DPM;  Location: [facility] OR;  Service: Podiatry;  Laterality: Left;  AMPUTATION PARTIAL RAY LEFT FOOT AND IRRIGATION/DEBRIDEMENT   CHOLECYSTECTOMY  01/24/2003   [name] 04/04/2011   COLONOSCOPY     INCISION AND DRAINAGE ABSCESS  03/17/2012   "right but; left lower abdoment"/notes 03/17/2012   LOWER EXTREMITY INTERVENTION N/A 09/10/2017   Procedure: LOWER EXTREMITY INTERVENTION;  Surgeon: Runell Gess, MD;  Location: [facility];  Service: Cardiovascular;  Laterality: N/A;   PERIPHERAL VASCULAR ATHERECTOMY  09/10/2017   Procedure: PERIPHERAL VASCULAR ATHERECTOMY;  Surgeon: Runell Gess, MD;  Location: [facility] INVASIVE [facility];  Service: Cardiovascular;;  left AT   PERIPHERAL VASCULAR BALLOON ANGIOPLASTY Left 04/06/2022   Procedure: PERIPHERAL VASCULAR BALLOON ANGIOPLASTY;  Surgeon: Cephus Shelling, MD;  Location:  [facility];  Service: Cardiovascular;   Laterality: Left;    ROS: Review of Systems Negative except as stated above  PHYSICAL EXAM: BP (!) 183/76 (BP Location: Left Arm, Patient Position: Sitting, Cuff Size: Normal)   Pulse 67   Temp 97.9 F (36.6 C) (Oral)   Ht 6' (1.829 m)   Wt 214 lb (97.1 kg)   SpO2 99%   BMI 29.02 kg/m   Wt Readings from Last 3 Encounters:  06/08/23 214 lb (97.1 kg)  06/07/23 213 lb (96.6 kg)  02/28/23 221 lb (100.2 kg)  BP 180/90  Physical Exam  General appearance - alert, well appearing, and in no distress Neck - supple, no significant adenopathy Chest - clear to auscultation, no wheezes, rales or rhonchi, symmetric air entry Heart - normal rate, regular rhythm, normal S1, S2, no murmurs, rubs, clicks or gallops Extremities -trace edema in both lower extremities left greater than right.  1+ left pedal edema. Diabetic Foot Exam - Simple   Simple Foot Form Diabetic Foot exam was performed with the following findings: Yes 06/08/2023  5:44 PM  Visual Inspection See comments: Yes Sensation Testing See comments: Yes Pulse Check Posterior Tibialis and Dorsalis pulse intact bilaterally: Yes Comments She has had amputation of the left big toe.  Toenails are discolored and thick.  Decree sensation on plantar surface of both feet on leap exam.  She is flat-footed.         Latest Ref Rng & Units 11/24/2022   11:19 AM 04/24/2022   11:52 AM 04/06/2022    8:07 AM  CMP  Glucose 70 - 99 mg/dL 161  096  045   BUN 8 - 27 mg/dL 16  18  14    Creatinine 0.57 - 1.00 mg/dL 4.09  8.11  9.14   Sodium 134 - 144 mmol/L 140  142  139   Potassium 3.5 - 5.2 mmol/L 4.5  3.8  3.5   Chloride 96 - 106 mmol/L 99  102  104   CO2 20 - 29 mmol/L 26  33    Calcium 8.7 - 10.3 mg/dL 9.7  9.4    Total Protein 6.0 - 8.5 g/dL 6.9  7.0    Total Bilirubin 0.0 - 1.2 mg/dL 0.5  0.6    Alkaline Phos 44 - 121 IU/L 144     AST 0 - 40 IU/L 15  10    ALT 0 - 32 IU/L 13  8     Lipid Panel     Component Value Date/Time   CHOL  118 11/24/2022 1119   TRIG 61 11/24/2022 1119   HDL 61 11/24/2022 1119   CHOLHDL 1.9 11/24/2022 1119   CHOLHDL 3.8 09/19/2016 1452   VLDL 36 (H) 09/19/2016 1452   LDLCALC 44 11/24/2022 1119    CBC    Component Value Date/Time   WBC 4.6 04/24/2022 1152   RBC 4.29 04/24/2022 1152   HGB 12.0 04/24/2022 1152   HGB 13.1 05/03/2021 1130   HCT 37.3 04/24/2022 1152   HCT 40.3 05/03/2021 1130   PLT 211 04/24/2022 1152   PLT 235 05/03/2021 1130   MCV 86.9 04/24/2022 1152   MCV 87 05/03/2021 1130   MCH 28.0 04/24/2022 1152   MCHC 32.2 04/24/2022 1152   RDW 14.0 04/24/2022 1152   RDW 12.5 05/03/2021 1130   LYMPHSABS 1.7 04/01/2022 0132   LYMPHSABS 1.9 08/28/2018 1424   MONOABS 0.7 04/01/2022 0132   EOSABS 0.2 04/01/2022 0132  EOSABS 0.2 08/28/2018 1424   BASOSABS 0.0 04/01/2022 0132   BASOSABS 0.0 08/28/2018 1424    ASSESSMENT AND PLAN:  1. Type 2 diabetes mellitus with peripheral neuropathy (HCC) Insulin long-term use Long-term use of injectable non-insulin medication A1c  has improved but not at goal.  She is plugged in with endocrinology at [facility].  She will continue Ozempic, Toujeo and NovoLog as prescribed by her endocrinologist. Discussed and encourage healthy eating habits. Encouraged her to try to move is much as she can and make an effort to get out and walk 2 to 3 days a week. - POCT glucose (manual entry) - POCT glycosylated hemoglobin (Hb A1C)  2. Hypertension associated with diabetes (HCC) Not at goal.   She has not taken all of her medicines as yet for today.  She will take her medications as soon as she returns home.    lisinopril 40 mg daily, carvedilol 6.25 mg BID, Hydralazine 100 mg TID, HCTZ 25 mg daily and amlodipine 5 mg daily.   Advised to check blood pressure at least twice a week and record the readings.  Will have her follow-up with clinical pharmacist in 1 month.  3. Peripheral polyneuropathy Patient with painful neuropathy from  diabetes.  On gabapentin and tramadol and still does not feel adequately controlled.  She request referral to pain clinic.  Referral submitted. - Ambulatory referral to Pain Clinic    Patient was given the opportunity to ask questions.  Patient verbalized understanding of the plan and was able to repeat key elements of the plan.   This documentation was completed using Paediatric nurse.  Any transcriptional errors are unintentional.  Orders Placed This Encounter  Procedures   Ambulatory referral to Pain Clinic   POCT glucose (manual entry)   POCT glycosylated hemoglobin (Hb A1C)     Requested Prescriptions   Signed Prescriptions Disp Refills   TOUJEO SOLOSTAR 300 UNIT/ML Solostar Pen      Sig: Inject 70 Units into the skin daily.    Return in about 4 months (around 10/09/2023) for BP check Luke in 1 mth.  Jonah Blue, MD, FACP

## 2023-06-18 DIAGNOSIS — Z5181 Encounter for therapeutic drug level monitoring: Secondary | ICD-10-CM | POA: Diagnosis not present

## 2023-06-18 DIAGNOSIS — M79604 Pain in right leg: Secondary | ICD-10-CM | POA: Diagnosis not present

## 2023-06-18 DIAGNOSIS — I10 Essential (primary) hypertension: Secondary | ICD-10-CM | POA: Diagnosis not present

## 2023-06-18 DIAGNOSIS — Z79891 Long term (current) use of opiate analgesic: Secondary | ICD-10-CM | POA: Diagnosis not present

## 2023-06-18 DIAGNOSIS — E559 Vitamin D deficiency, unspecified: Secondary | ICD-10-CM | POA: Diagnosis not present

## 2023-06-18 DIAGNOSIS — M129 Arthropathy, unspecified: Secondary | ICD-10-CM | POA: Diagnosis not present

## 2023-06-18 DIAGNOSIS — M79605 Pain in left leg: Secondary | ICD-10-CM | POA: Diagnosis not present

## 2023-06-18 DIAGNOSIS — R0602 Shortness of breath: Secondary | ICD-10-CM | POA: Diagnosis not present

## 2023-06-18 DIAGNOSIS — R5383 Other fatigue: Secondary | ICD-10-CM | POA: Diagnosis not present

## 2023-06-18 DIAGNOSIS — Z131 Encounter for screening for diabetes mellitus: Secondary | ICD-10-CM | POA: Diagnosis not present

## 2023-06-18 DIAGNOSIS — M545 Low back pain, unspecified: Secondary | ICD-10-CM | POA: Diagnosis not present

## 2023-06-18 DIAGNOSIS — E7800 Pure hypercholesterolemia, unspecified: Secondary | ICD-10-CM | POA: Diagnosis not present

## 2023-06-18 DIAGNOSIS — D539 Nutritional anemia, unspecified: Secondary | ICD-10-CM | POA: Diagnosis not present

## 2023-06-18 DIAGNOSIS — Z1159 Encounter for screening for other viral diseases: Secondary | ICD-10-CM | POA: Diagnosis not present

## 2023-06-18 DIAGNOSIS — E0842 Diabetes mellitus due to underlying condition with diabetic polyneuropathy: Secondary | ICD-10-CM | POA: Diagnosis not present

## 2023-06-22 DIAGNOSIS — Z79899 Other long term (current) drug therapy: Secondary | ICD-10-CM | POA: Diagnosis not present

## 2023-06-25 DIAGNOSIS — Z79891 Long term (current) use of opiate analgesic: Secondary | ICD-10-CM | POA: Diagnosis not present

## 2023-06-25 DIAGNOSIS — E1129 Type 2 diabetes mellitus with other diabetic kidney complication: Secondary | ICD-10-CM | POA: Diagnosis not present

## 2023-06-25 DIAGNOSIS — E0842 Diabetes mellitus due to underlying condition with diabetic polyneuropathy: Secondary | ICD-10-CM | POA: Diagnosis not present

## 2023-06-25 DIAGNOSIS — R809 Proteinuria, unspecified: Secondary | ICD-10-CM | POA: Diagnosis not present

## 2023-06-25 DIAGNOSIS — R030 Elevated blood-pressure reading, without diagnosis of hypertension: Secondary | ICD-10-CM | POA: Diagnosis not present

## 2023-06-27 DIAGNOSIS — Z79899 Other long term (current) drug therapy: Secondary | ICD-10-CM | POA: Diagnosis not present

## 2023-07-06 DIAGNOSIS — I10 Essential (primary) hypertension: Secondary | ICD-10-CM | POA: Diagnosis not present

## 2023-07-06 DIAGNOSIS — R809 Proteinuria, unspecified: Secondary | ICD-10-CM | POA: Diagnosis not present

## 2023-07-06 DIAGNOSIS — E0842 Diabetes mellitus due to underlying condition with diabetic polyneuropathy: Secondary | ICD-10-CM | POA: Diagnosis not present

## 2023-07-06 DIAGNOSIS — E1129 Type 2 diabetes mellitus with other diabetic kidney complication: Secondary | ICD-10-CM | POA: Diagnosis not present

## 2023-07-06 DIAGNOSIS — Z79891 Long term (current) use of opiate analgesic: Secondary | ICD-10-CM | POA: Diagnosis not present

## 2023-07-18 DIAGNOSIS — E0842 Diabetes mellitus due to underlying condition with diabetic polyneuropathy: Secondary | ICD-10-CM | POA: Diagnosis not present

## 2023-07-18 DIAGNOSIS — E1129 Type 2 diabetes mellitus with other diabetic kidney complication: Secondary | ICD-10-CM | POA: Diagnosis not present

## 2023-07-18 DIAGNOSIS — R809 Proteinuria, unspecified: Secondary | ICD-10-CM | POA: Diagnosis not present

## 2023-08-10 ENCOUNTER — Other Ambulatory Visit: Payer: Self-pay

## 2023-08-13 DIAGNOSIS — Z79899 Other long term (current) drug therapy: Secondary | ICD-10-CM | POA: Diagnosis not present

## 2023-08-13 DIAGNOSIS — Z9181 History of falling: Secondary | ICD-10-CM | POA: Diagnosis not present

## 2023-08-13 DIAGNOSIS — E0842 Diabetes mellitus due to underlying condition with diabetic polyneuropathy: Secondary | ICD-10-CM | POA: Diagnosis not present

## 2023-08-13 DIAGNOSIS — R809 Proteinuria, unspecified: Secondary | ICD-10-CM | POA: Diagnosis not present

## 2023-08-13 DIAGNOSIS — I10 Essential (primary) hypertension: Secondary | ICD-10-CM | POA: Diagnosis not present

## 2023-08-13 DIAGNOSIS — E1129 Type 2 diabetes mellitus with other diabetic kidney complication: Secondary | ICD-10-CM | POA: Diagnosis not present

## 2023-08-14 ENCOUNTER — Ambulatory Visit (HOSPITAL_BASED_OUTPATIENT_CLINIC_OR_DEPARTMENT_OTHER): Payer: 59 | Admitting: Cardiovascular Disease

## 2023-08-14 ENCOUNTER — Other Ambulatory Visit (HOSPITAL_BASED_OUTPATIENT_CLINIC_OR_DEPARTMENT_OTHER): Payer: Self-pay

## 2023-08-14 ENCOUNTER — Encounter (HOSPITAL_BASED_OUTPATIENT_CLINIC_OR_DEPARTMENT_OTHER): Payer: Self-pay | Admitting: Cardiovascular Disease

## 2023-08-14 VITALS — BP 184/78 | HR 59 | Ht 72.0 in | Wt 213.6 lb

## 2023-08-14 DIAGNOSIS — R5383 Other fatigue: Secondary | ICD-10-CM | POA: Diagnosis not present

## 2023-08-14 DIAGNOSIS — E1151 Type 2 diabetes mellitus with diabetic peripheral angiopathy without gangrene: Secondary | ICD-10-CM | POA: Diagnosis not present

## 2023-08-14 DIAGNOSIS — R0683 Snoring: Secondary | ICD-10-CM | POA: Diagnosis not present

## 2023-08-14 DIAGNOSIS — R400 Somnolence: Secondary | ICD-10-CM | POA: Diagnosis not present

## 2023-08-14 DIAGNOSIS — Z794 Long term (current) use of insulin: Secondary | ICD-10-CM

## 2023-08-14 DIAGNOSIS — I70229 Atherosclerosis of native arteries of extremities with rest pain, unspecified extremity: Secondary | ICD-10-CM

## 2023-08-14 DIAGNOSIS — I701 Atherosclerosis of renal artery: Secondary | ICD-10-CM | POA: Diagnosis not present

## 2023-08-14 DIAGNOSIS — R0609 Other forms of dyspnea: Secondary | ICD-10-CM

## 2023-08-14 DIAGNOSIS — I10 Essential (primary) hypertension: Secondary | ICD-10-CM

## 2023-08-14 DIAGNOSIS — E785 Hyperlipidemia, unspecified: Secondary | ICD-10-CM

## 2023-08-14 MED ORDER — OLMESARTAN-AMLODIPINE-HCTZ 40-10-25 MG PO TABS: 1.0000 | ORAL_TABLET | Freq: Every day | ORAL | 1 refills | Status: DC

## 2023-08-14 MED ORDER — OLMESARTAN-AMLODIPINE-HCTZ 40-10-25 MG PO TABS
1.0000 | ORAL_TABLET | Freq: Every day | ORAL | 1 refills | Status: DC
  Filled 2023-08-14 – 2023-08-16 (×2): qty 90, 90d supply, fill #0

## 2023-08-14 NOTE — Progress Notes (Unsigned)
[facility] Follow-up:    Date:  08/15/2023   ID:  Katherine [name], DOB 11-Apr-1956, MRN [id number]  PCP:  Marcine Matar, MD  Cardiologist:  None  Nephrologist:  Referring MD: Marcine Matar, MD   CC: Hypertension  History of Present Illness:    Katherine [name] is a 67 y.o. female with a hx of PAD, toe ulcer s/p amputation LT 1st toe, hypertension, hyperlipidemia, type 2 diabetes mellitus with neuropathy, retinopathy and possible gastroparesis, diabetic ketoacidoses, anemia, and depression, here for follow-up. She was initially seen 09/2022 in the [facility]. She previously saw Dr. Allyson Sabal 02/2021. He did peripheral angiography 08/2017 that revealed an occluded left anterior tibial and posterior tibial artery with a patent peroneal. He was able to recanalize the anterior tibial down to her foot. Her last ABIs 04/2022 were normal bilaterally.  She saw her PCP, Dr. Laural Benes, 10/05/2022 and her blood pressure was 160/84. She was compliant with Coreg 6.25 mg BID, amlodipine 10 mg daily, hydralazine 100 mg TID, HCTZ 25 mg daily, and lisinopril 20 mg daily. She was not monitoring home blood pressures. Given her uncontrolled blood pressure on multiple agents she was referred to the [facility] for further evaluation and management.  At her initial visit, she reported home blood pressures higher than her in-office reading 124/51. She wasn't formally exercising much due to LE pain. Wasn't checking home BP as she felt her cuff was inaccurate. She was provided with a prescription and information about where to get a BP cuff. Continued lisinopril, carvedilol, and amlodipine. TSH, cortisol, renin and aldosterone levels were normal. Renal artery dopplers 10/2022 revealed 1-59% stenosis bilaterally. A murmur was noted on exam and she had lower extremity edema. Echocardiogram 11/2022 showed LVEF 60-65%, moderate LVH, elevated left ventricular end-diastolic  pressure, trivial mitral and aortic regurgitation.  At follow-up 05/2023 she continues to struggle with lower extremity pain.  Blood pressure was well-controlled.  She had lower extremity edema and amlodipine was reduced to 5 mg.  Lisinopril was increased.  Past Medical History:  Diagnosis Date   Anemia    requiring transfusion   Boil of buttock ~ 2016   Depression    Diabetic neuropathy (HCC)    Katherine [name] 08/23/2017   DKA (diabetic ketoacidoses)    recent/notes 01/06/2015   Hyperlipidemia    Hypertension    Murmur 10/19/2022   Neuropathy    Peripheral vascular disease (HCC)    Toe ulcer (HCC)    left great toe/notes 08/23/2017   Type II diabetes mellitus (HCC)     Past Surgical History:  Procedure Laterality Date   ABDOMINAL AORTOGRAM W/LOWER EXTREMITY N/A 04/06/2022   Procedure: ABDOMINAL AORTOGRAM W/LOWER EXTREMITY;  Surgeon: Cephus Shelling, MD;  Location: [facility];  Service: Cardiovascular;  Laterality: N/A;   AMPUTATION TOE Left 10/26/2017   Procedure: AMPUTATION PARTIAL RAY LEFT FOOT AND IRRIGATION/DEBRIDEMENT;  Surgeon: Felecia Shelling, DPM;  Location: [facility];  Service: Podiatry;  Laterality: Left;  AMPUTATION PARTIAL RAY LEFT FOOT AND IRRIGATION/DEBRIDEMENT   CHOLECYSTECTOMY  01/24/2003   Katherine [name] 04/04/2011   COLONOSCOPY     INCISION AND DRAINAGE ABSCESS  03/17/2012   "right but; left lower abdoment"/notes 03/17/2012   LOWER EXTREMITY INTERVENTION N/A 09/10/2017   Procedure: LOWER EXTREMITY INTERVENTION;  Surgeon: Runell Gess, MD;  Location: [facility];  Service: Cardiovascular;  Laterality: N/A;   PERIPHERAL VASCULAR ATHERECTOMY  09/10/2017   Procedure: PERIPHERAL VASCULAR ATHERECTOMY;  Surgeon:  Runell Gess, MD;  Location: [facility];  Service: Cardiovascular;;  left AT   PERIPHERAL VASCULAR BALLOON ANGIOPLASTY Left 04/06/2022   Procedure: PERIPHERAL VASCULAR BALLOON ANGIOPLASTY;  Surgeon: Cephus Shelling, MD;  Location: [facility];  Service: Cardiovascular;  Laterality: Left;    Current Medications: Current Meds  Medication Sig   atorvastatin (LIPITOR) 80 MG tablet Take 1 tablet (80 mg total) by mouth daily at 6 PM.   BAQSIMI ONE PACK 3 MG/DOSE POWD Place 1 spray into the nose as needed (Low blood sugar).   Blood Pressure KIT Check blood pressure twice a day Dx: I10, hypertension   carvedilol (COREG) 6.25 MG tablet Take 1 tablet (6.25 mg total) by mouth 2 (two) times daily with a meal.   clopidogrel (PLAVIX) 75 MG tablet TAKE 1 TABLET BY MOUTH EVERY DAY WITH BREAKFAST (Patient taking differently: Take 75 mg by mouth at bedtime.)   Continuous Blood Gluc Receiver (FREESTYLE LIBRE 2 READER) DEVI Use as directed 3 times daily   Continuous Blood Gluc Receiver (FREESTYLE LIBRE 2 READER) DEVI Scan as needed for continuous glucose monitoring.   Continuous Blood Gluc Sensor (FREESTYLE LIBRE 2 SENSOR) MISC Change sensor every 2 weeks.   Continuous Blood Gluc Sensor (FREESTYLE LIBRE 2 SENSOR) MISC Inject 1 sensor to the skin every 14 days for continuous glucose monitoring.   gabapentin (NEURONTIN) 600 MG tablet Take 1 tablet (600 mg total) by mouth 2 (two) times daily. (Patient taking differently: Take 650 mg by mouth 2 (two) times daily.)   glucose blood (ONETOUCH VERIO) test strip 1 each by Misc.(Non-Drug; Combo Route) route 4 times daily before meals and nightly.   hydrALAZINE (APRESOLINE) 100 MG tablet Take 1 tablet (100 mg total) by mouth 3 (three) times daily.   insulin aspart (NOVOLOG) 100 UNIT/ML FlexPen Inject 10-17 units into the skin 3 times daily before meals. (Patient taking differently: Inject 10 Units into the skin 3 (three) times daily with meals.)   Lancets (ONETOUCH DELICA PLUS LANCET33G) MISC Use 4 times daily before meals and nightly.   Multiple Vitamins-Minerals (MULTIVITAMIN ADULT) CHEW Chew 2 tablets by mouth daily.   mupirocin cream (BACTROBAN) 2 % Apply topically daily. Apply to left foot wound and cover  with dry dressing once daily (Patient taking differently: Apply 1 Application topically as needed. Apply to left foot wound and cover with dry dressing once daily)   RESTASIS 0.05 % ophthalmic emulsion Place 1 drop into both eyes 2 (two) times daily.   Semaglutide, 1 MG/DOSE, (OZEMPIC, 1 MG/DOSE,) 4 MG/3ML SOPN Inject 1 mg into the skin once a week.   TOUJEO SOLOSTAR 300 UNIT/ML Solostar Pen Inject 70 Units into the skin daily.   traMADol (ULTRAM) 50 MG tablet Take 1 tablet (50 mg total) by mouth every 12 (twelve) hours as needed.   TRUEPLUS PEN NEEDLES 31G X 5 MM MISC Use to inject Toujeo once daily.   VITAMIN D PO Take 2 tablets by mouth at bedtime.   [DISCONTINUED] amLODipine (NORVASC) 5 MG tablet Take 1 tablet (5 mg total) by mouth daily.   [DISCONTINUED] hydrochlorothiazide (HYDRODIURIL) 25 MG tablet Take 1 tablet (25 mg total) by mouth daily.   [DISCONTINUED] lisinopril (ZESTRIL) 40 MG tablet Take 1 tablet (40 mg total) by mouth daily.   [DISCONTINUED] Olmesartan-amLODIPine-HCTZ 40-10-25 MG TABS Take 1 tablet by mouth daily.     Allergies:   Other and Metformin and related   Social History   Socioeconomic History  Marital status: Married    Spouse name: Not on file   Number of children: Not on file   Years of education: Not on file   Highest education level: Not on file  Occupational History   Not on file  Tobacco Use   Smoking status: Never    Passive exposure: Never   Smokeless tobacco: Never  Vaping Use   Vaping status: Never Used  Substance and Sexual Activity   Alcohol use: No   Drug use: No   Sexual activity: Never  Other Topics Concern   Not on file  Social History Narrative   Not on file   Social Determinants of Health   Financial Resource Strain: Low Risk  (10/05/2022)   Overall Financial Resource Strain (CARDIA)    Difficulty of Paying Living Expenses: Not hard at all  Food Insecurity: Food Insecurity Present (10/05/2022)   Hunger Vital Sign    Worried  About Running Out of Food in the Last Year: Never true    Ran Out of Food in the Last Year: Sometimes true  Transportation Needs: No Transportation Needs (10/05/2022)   PRAPARE - Administrator, Civil Service (Medical): No    Lack of Transportation (Non-Medical): No  Physical Activity: Inactive (10/05/2022)   Exercise Vital Sign    Days of Exercise per Week: 0 days    Minutes of Exercise per Session: 0 min  Stress: No Stress Concern Present (10/05/2022)   [employer] of Occupational Health - Occupational Stress Questionnaire    Feeling of Stress : Only a little  Social Connections: Socially Integrated (10/05/2022)   Social Connection and Isolation Panel [NHANES]    Frequency of Communication with Friends and Family: More than three times a week    Frequency of Social Gatherings with Friends and Family: Once a week    Attends Religious Services: More than 4 times per year    Active Member of [employer] or Organizations: Yes    Attends Engineer, structural: More than 4 times per year    Marital Status: Married     Family History: The patient's family history includes Cancer in her maternal grandmother; Diabetes in her father, paternal aunt, paternal grandfather, and paternal uncle; Hypertension in her father, maternal grandfather, mother, paternal aunt, and paternal uncle; Kidney disease in her mother. There is no history of Colon cancer or Breast cancer.  ROS:   Please see the history of present illness.    (+) LE and foot pain L>R (+) LLE edema (+) Fatigue All other systems reviewed and are negative.  EKGs/Labs/Other Studies Reviewed:    Echocardiogram  11/23/2022:  1. Left ventricular ejection fraction, by estimation, is 60 to 65%. Left  ventricular ejection fraction by 3D volume is 59 %. The left ventricle has  normal function. The left ventricle has no regional wall motion  abnormalities. There is moderate  concentric left ventricular hypertrophy. Left  ventricular diastolic  parameters are indeterminate. Elevated left ventricular end-diastolic  pressure.   2. Right ventricular systolic function is normal. The right ventricular  size is mildly enlarged. Tricuspid regurgitation signal is inadequate for  assessing PA pressure.   3. Right atrial size was mildly dilated.   4. The mitral valve is normal in structure. Trivial mitral valve  regurgitation. No evidence of mitral stenosis.   5. The aortic valve is normal in structure. Aortic valve regurgitation is  trivial. No aortic stenosis is present.   6. The inferior vena cava is normal  in size with greater than 50%  respiratory variability, suggesting right atrial pressure of 3 mmHg.   Bilateral Renal Artery Dopplers  11/15/2022: Summary:  Renal:    Right: 1-59% stenosis of the right renal artery. RRV flow present.         Abnormal right Resistive Index. Normal cortical thickness of         right kidney. Normal size right kidney.  Left:  1-59% stenosis of the left renal artery. LRV flow present.         Normal cortical thickness of the left kidney. Normal size of         left kidney. Abnormal left Resisitve Index.   ABI dopplers  11/07/2022: Bilateral ABIs and TBIs appear essentially unchanged compared to prior  study on 05/16/2022.    Summary:  Right: Resting right ankle-brachial index is within normal range. The  right toe-brachial index is abnormal.   Left: Resting left ankle-brachial index is within normal range.   LE Venous Doppler  08/07/2022: Summary:  Left:  - No evidence of deep vein thrombosis seen in the left lower extremity,  from the common femoral through the popliteal veins.  - No evidence of superficial venous thrombosis in the left lower  extremity.  - No evidence of superficial venous reflux seen in the left short  saphenous vein.  - Venous reflux is noted in the left greater saphenous vein in the thigh.   ABI  05/16/2022: Right ABIs appear essentially  unchanged. Left ABIs appear increased  compared to prior study on 04/01/2022.    Summary:  Right: Resting right ankle-brachial index is within normal range. No  evidence of significant right lower extremity arterial disease. The right  toe-brachial index is abnormal.   Left: Resting left ankle-brachial index is within normal range. No  evidence of significant left lower extremity arterial disease.   LE Arterial Duplex Study  05/16/2022: Summary:  Left: Widely patent left lower extremity arterial system.  Biphasic waveforms observed in the mid and distal anterior tibial artery.   Abdominal Aortogram with Peripheral Vascular Angioplasty  04/06/2022: Pre-operative Diagnosis: Left fifth toe osteomyelitis with known peripheral arterial disease Post-operative diagnosis:  Same Surgeon:  Cephus Shelling, MD Procedure Performed: 1.  Ultrasound-guided access right common femoral artery 2.  Aortogram with catheter selection of aorta 3.  Left lower extremity arteriogram with selection of third order branches 4.  Left anterior tibial artery angioplasty (3 mm x 100 mm Sterling) 5.  Mynx closure of the right common femoral artery 6.  57 minutes of monitored moderate conscious sedation time  Findings:  Aortogram showed patent renal arteries bilaterally with no flow-limiting stenosis in the aortoiliac segment.  The left common femoral, profunda, SFA, popliteal arteries are all widely patent.  She has posterior tibial artery occlusion.  Dominant runoff is through the anterior tibial and peroneal.  The peroneal is patent but diseased distally at the ankle and it splits into two branches.  The anterior tibial had two moderate stenosis >60% in the midportion of the vessel.  Ultimately the anterior tibial artery stenosis was crossed antegrade and angioplastied with a 3 mm Sterling.  No significant residual stenosis.  Preserved runoff and optimized from a vascular surgery standpoint.  Plan: Continue Plavix  statin.  We will arrange follow-up in 1 month with arterial duplex.  Optimized for toe amputation.   EKG:  EKG is personally reviewed. 06/07/2023:  Not ordered. 10/19/2022: Sinus rhythm. Rate 73 bpm.  Recent Labs: 10/31/2022: TSH  1.120 11/24/2022: ALT 13 06/18/2023: BUN 15; Creatinine, Ser 0.88; Potassium 4.3; Sodium 139   Recent Lipid Panel    Component Value Date/Time   CHOL 118 11/24/2022 1119   TRIG 61 11/24/2022 1119   HDL 61 11/24/2022 1119   CHOLHDL 1.9 11/24/2022 1119   CHOLHDL 3.8 09/19/2016 1452   VLDL 36 (H) 09/19/2016 1452   LDLCALC 44 11/24/2022 1119    Physical Exam:    VS:  BP (!) 184/78 (BP Location: Right Arm, Patient Position: Sitting, Cuff Size: Normal)   Pulse (!) 59   Ht 6' (1.829 m)   Wt 213 lb 9.6 oz (96.9 kg)   SpO2 97%   BMI 28.97 kg/m  , BMI Body mass index is 28.97 kg/m. GENERAL:  Well appearing HEENT: Pupils equal round and reactive, fundi not visualized, oral mucosa unremarkable NECK:  No jugular venous distention, waveform within normal limits, carotid upstroke brisk and symmetric, no bruits, no thyromegaly LUNGS:  Clear to auscultation bilaterally HEART:  RRR.  PMI not displaced or sustained,S1 and S2 within normal limits, no S3, no S4, no clicks, no rubs, II/VI systolic murmur at the LUSB ABD:  Flat, positive bowel sounds normal in frequency in pitch, no bruits, no rebound, no guarding, no midline pulsatile mass, no hepatomegaly, no splenomegaly EXT:  2 plus pulses throughout, 1+ bilateral non-pitting edema, no cyanosis no clubbing SKIN:  No rashes no nodules NEURO:  Cranial nerves II through XII grossly intact, motor grossly intact throughout PSYCH:  Cognitively intact, oriented to person place and time  ASSESSMENT/PLAN:    # Resistant Hypertension Despite multiple medications, blood pressure remains elevated. Patient reports inconsistent medication regimen. -Combine amlodipine, hydrochlorothiazide, and olmesartan into Tribenzor for daily  use.  She struggles with polypharmacy. Discontinue lisinopril and HCTZ. -Increase amlodipine dosage to 10mg . -Check blood pressure at home and record results. -Check labs in one week to monitor kidney function and electrolytes.  # Chronic Pain Patient reports significant leg pain, limiting mobility and exercise. Current pain management with gabapentin and tramadol is inadequate. -Refer to pain management for further evaluation and treatment.  # Fatigue Patient reports excessive tiredness and lack of energy. Possible causes include sleep apnea, anemia, or side effects from gabapentin. -Order home sleep study to evaluate for sleep apnea. -Check blood counts to evaluate for anemia. -Consider adjusting gabapentin dosage if contributing to fatigue.  # Cardiovascular Risk Given the patient's resistant hypertension and diabetes, there is a potential risk for cardiovascular disease. -Order stress test to evaluate cardiac function.  General Health Maintenance -Encourage patient to increase physical activity as tolerated. -Continue to monitor diet, particularly salt intake.     Lexiscan Myoview to evaluate for ischemia given exertional dyspnea and fatigue.  She is unable to walk on a treadmill.      Screening for Secondary Hypertension:     10/19/2022   11:23 AM  Causes  Drugs/Herbals Screened     - Comments limits salt.  Does eat out some. Daily coffee x2.No EtOH.  No NSAIDS  Renovascular HTN Screened     - Comments check renal artery Doppler  Sleep Apnea Screened     - Comments Snores, AM fatigue, daytime somnolence.  Check sleep study  Thyroid Disease Screened     - Comments Check TSh  Hyperaldosteronism Screened     - Comments Check renin and aldosterone  Pheochromocytoma N/A  Cushing's Syndrome Screened     - Comments check AM cortisol    Relevant Labs/Studies:  Latest Ref Rng & Units 06/18/2023    1:03 PM 11/24/2022   11:19 AM 04/24/2022   11:52 AM  Basic Labs  Sodium 134  - 144 mmol/L 139  140  142   Potassium 3.5 - 5.2 mmol/L 4.3  4.5  3.8   Creatinine 0.57 - 1.00 mg/dL 0.45  4.09  8.11        Latest Ref Rng & Units 10/31/2022   11:06 AM 08/28/2018    2:24 PM  Thyroid   TSH 0.450 - 4.500 uIU/mL 1.120  1.370        Latest Ref Rng & Units 10/31/2022   11:06 AM  Renin/Aldosterone   Aldosterone 0.0 - 30.0 ng/dL 6.6   Aldos/Renin Ratio 0.0 - 30.0 26.0              11/15/2022   12:32 PM  Renovascular   Renal Artery [place] Completed Yes    Disposition:    FU with Leeandra Ellerson C. Duke Salvia, MD, [facility] in 2 months.  Medication Adjustments/Labs and Tests Ordered: Current medicines are reviewed at length with the patient today.  Concerns regarding medicines are outlined above.   Orders Placed This Encounter  Procedures   CBC with Differential/Platelet   TSH   MYOCARDIAL PERFUSION IMAGING   [place] Sleep Study   Meds ordered this encounter  Medications   DISCONTD: Olmesartan-amLODIPine-HCTZ 40-10-25 MG TABS    Sig: Take 1 tablet by mouth daily.    Dispense:  90 tablet    Refill:  1    D/C LISINOPRIL, AMLODIPINE, AND HYDROCHLOROTHIAZIDE   Olmesartan-amLODIPine-HCTZ 40-10-25 MG TABS    Sig: Take 1 tablet by mouth daily.    Dispense:  90 tablet    Refill:  1    D/C LISINOPRIL, AMLODIPINE, AND HYDROCHLOROTHIAZIDE   I,Mathew Stumpf,acting as a scribe for Chilton Si, MD.,have documented all relevant documentation on the behalf of Chilton Si, MD,as directed by  Chilton Si, MD while in the presence of Chilton Si, MD.  I, Ghina Bittinger C. Duke Salvia, MD have reviewed all documentation for this visit.  The documentation of the exam, diagnosis, procedures, and orders on 08/15/2023 are all accurate and complete.  Signed, Chilton Si, MD  08/15/2023 9:50 AM    [facility]

## 2023-08-14 NOTE — Patient Instructions (Addendum)
Medication Instructions:  STOP LISINOPRIL, AMLODIPINE, AND HYDROCHLOROTHIAZIDE   START OLMESARTAN-AMLODIPINE-hydrochlorothiazide 40-10-25 MG DAILY   Labwork: CBC/TSH SOON   Testing/Procedures: Your physician has requested that you have a lexiscan myoview. For further information please visit [email or web address] Please follow instruction sheet, as given.  HOME SLEEP STUDY   Follow-Up: 6 WEEKS IN ADV HTN WITH CAITLIN W NP OR PHARM D   Any Other Special Instructions Will Be Listed Below (If Applicable). MONITOR BLOOD PRESSURE TWICE A DAY, LOG AND BRING TO YOUR FOLLOW UP  If you need a refill on your cardiac medications before your next appointment, please call your pharmacy.  WatchPAT?  Is a FDA cleared portable home sleep study test that uses a watch and 3 points of contact to monitor 7 different channels, including your heart rate, oxygen saturations, body position, snoring, and chest motion.  The study is easy to use from the comfort of your own home and accurately detect sleep apnea.  Before bed, you attach the chest sensor, attached the sleep apnea bracelet to your nondominant hand, and attach the finger probe.  After the study, the raw data is downloaded from the watch and scored for apnea events.   For more information: [email or web address]  Patient Testing Instructions:  Do not put battery into the device until bedtime when you are ready to begin the test. Please call the support number if you need assistance after following the instructions below: 24 hour support line- [phone] or [facility] support at [phone] (option 2)  Download the IntelWatchPAT One" app through the google play store or App Store  Be sure to turn on or enable access to bluetooth in settlings on your smartphone/ device  Make sure no other bluetooth devices are on and within the vicinity of your smartphone/ device and WatchPAT watch during testing.  Make sure to leave your smart  phone/ device plugged in and charging all night.  When ready for bed:  Follow the instructions step by step in the WatchPAT One App to activate the testing device. For additional instructions, including video instruction, visit the WatchPAT One video on Youtube. You can search for WatchPat One within Youtube (video is 4 minutes and 18 seconds) or enter: [email or web address] Please note: You will be prompted to enter a Pin to connect via bluetooth when starting the test. The PIN will be assigned to you when you receive the test.  The device is disposable, but it recommended that you retain the device until you receive a call letting you know the study has been received and the results have been interpreted.  We will let you know if the study did not transmit to [place] properly after the test is completed. You do not need to call us to confirm the receipt of the test.  Please complete the test within 48 hours of receiving PIN.   Frequently Asked Questions:  What is Watch Dennie Bible one?  A single use fully disposable home sleep apnea testing device and will not need to be returned after completion.  What are the requirements to use WatchPAT one?  The be able to have a successful watchpat one sleep study, you should have your Watch pat one device, your smart phone, watch pat one app, your PIN number and Internet access What type of phone do I need?  You should have a smart phone that uses Android 5.1 and above or any Iphone with IOS 10 and above How can I download the WatchPAT one app?  Based on your  device type search for WatchPAT one app either in google play for android devices or APP store for Iphone's Where will I get my PIN for the study?  Your PIN will be provided by your physician's office. It is used for authentication and if you lose/forget your PIN, please reach out to your providers office.  I do not have Internet at home. Can I do WatchPAT one study?  WatchPAT One needs Internet  connection throughout the night to be able to transmit the sleep data. You can use your home/local internet or your cellular's data package. However, it is always recommended to use home/local Internet. It is estimated that between 20MB-30MB will be used with each study.However, the application will be looking for space in the phone to start the study.  What happens if I lose internet or bluetooth connection?  During the internet disconnection, your phone will not be able to transmit the sleep data. All the data, will be stored in your phone. As soon as the internet connection is back on, the phone will being sending the sleep data. During the bluetooth disconnection, WatchPAT one will not be able to to send the sleep data to your phone. Data will be kept in the [facility] one until two devices have bluetooth connection back on. As soon as the connection is back on, WatchPAT one will send the sleep data to the phone.  How long do I need to wear the WatchPAT one?  After you start the study, you should wear the device at least 6 hours.  How far should I keep my phone from the device?  During the night, your phone should be within 15 feet.  What happens if I leave the room for restroom or other reasons?  Leaving the room for any reason will not cause any problem. As soon as your get back to the room, both devices will reconnect and will continue to send the sleep data. Can I use my phone during the sleep study?  Yes, you can use your phone as usual during the study. But it is recommended to put your watchpat one on when you are ready to go to bed.  How will I get my study results?  A soon as you completed your study, your sleep data will be sent to the provider. They will then share the results with you when they are ready.   Your Physician has requested you have a Myocardial Perfusion Imaging Study.    Please arrive 15 minutes prior to your appointment time for registration and insurance purposes.    The test will take approximately 3 to 4 hours to complete; you may bring reading material. If someone comes with you to your appointment, they will need to remain in the main lobby due to limited testing space in the testing area. **If you are pregnant or breastfeeding, please notify the nuclear lab prior to your appointment**   How to prepare for your test:  - Do not eat or drink 3 hours prior to your test, except you may have water  - Do not consume products containing caffeine ( regular or decaf) 12 hours prior to your test ( coffee, chocolate, sodas or teas)  - Do bring a list of your current medications with you. If not listed below, you may take your medications as normal (Hold beta blocker- 24 hour prior for exercise myoview)   - Do wear comfortable clothes (no dresses or overalls) and walking shoes ( tennis shoes preferred),  no heel or open toe shoes are allowed - Do not wear cologne, perfume, aftershave, or lotions ( deodorant is allowed)  - If these instructions are not followed, your test will be rescheduled   If you cannot keep your appointment, please provide 24 hours notice to the Nuclear Lab, to avoid a possible $50 charge to your account!

## 2023-08-14 NOTE — Progress Notes (Signed)
[facility] Follow-up:    Date:  08/15/2023   ID:  Katherine [name], DOB [birth year], MRN [id number]  PCP:  Katherine Matar, MD  Cardiologist:  None  Nephrologist:  Referring MD: Katherine Matar, MD   CC: Hypertension  History of Present Illness:    Katherine [name] is a 67 y.o. female with a hx of PAD, toe ulcer s/p amputation LT 1st toe, hypertension, hyperlipidemia, type 2 diabetes mellitus with neuropathy, retinopathy and possible gastroparesis, diabetic ketoacidoses, anemia, and depression, here for follow-up. She was initially seen 09/2022 in the [facility]. She previously saw Dr. Allyson [name] 02/2021. He did peripheral angiography 08/2017 that revealed an occluded left anterior tibial and posterior tibial artery with a patent peroneal. He was able to recanalize the anterior tibial down to her foot. Her last ABIs 04/2022 were normal bilaterally.  She saw her PCP, Dr. Laural [name], 10/05/2022 and her blood pressure was 160/84. She was compliant with Coreg 6.25 mg BID, amlodipine 10 mg daily, hydralazine 100 mg TID, HCTZ 25 mg daily, and lisinopril 20 mg daily. She was not monitoring home blood pressures. Given her uncontrolled blood pressure on multiple agents she was referred to the [facility] for further evaluation and management.  At her initial visit, she reported home blood pressures higher than her in-office reading 124/51. She wasn't formally exercising much due to LE pain. Wasn't checking home BP as she felt her cuff was inaccurate. She was provided with a prescription and information about where to get a BP cuff. Continued lisinopril, carvedilol, and amlodipine. TSH, cortisol, renin and aldosterone levels were normal. Renal artery dopplers 10/2022 revealed 1-59% stenosis bilaterally. A murmur was noted on exam and she had lower extremity edema. Echocardiogram 11/2022 showed LVEF 60-65%, moderate LVH, elevated left ventricular end-diastolic  pressure, trivial mitral and aortic regurgitation.  At follow-up 05/2023 she continues to struggle with lower extremity pain.  Blood pressure was well-controlled.  She had lower extremity edema and amlodipine was reduced to 5 mg.  Lisinopril was increased.  Today, she complains of feeling very fatigued and "heavy" as if she has worked for 5-8 hours. This began yesterday. She denies feeling short of breath, however she has noticed that it takes her longer than usual to clean her home. Additionally she has been walking up and down her long driveway for exercise. By the time she returns home she is struggling with leg and foot pain. She has been trying to stay active at home with walking between her rooms at home and working in each for 20 minutes. After an hour her feet start to swell and she will rest. Of note, she has noticed that she is sleeping 1-2 hours longer than she usually does. She will snore if she is significantly tired. In the office her blood pressure is elevated to 192/78 initially, and 184/78 on manual recheck. She states that her BP readings at home have been "crazy". Typically she avoids salty foods as she doesn't like them. She denies any palpitations, chest pain, lightheadedness, headaches, syncope, orthopnea, or PND.   Past Medical History:  Diagnosis Date   Anemia    requiring transfusion   Boil of buttock ~ 2016   Depression    Diabetic neuropathy (HCC)    Katherine [name] 08/23/2017   DKA (diabetic ketoacidoses)    recent/notes 01/06/2015   Exertional dyspnea 08/15/2023   Hyperlipidemia    Hypertension    Murmur 10/19/2022   Neuropathy    Peripheral  vascular disease (HCC)    Toe ulcer (HCC)    left great toe/notes 08/23/2017   Type II diabetes mellitus (HCC)     Past Surgical History:  Procedure Laterality Date   ABDOMINAL AORTOGRAM W/LOWER EXTREMITY N/A 04/06/2022   Procedure: ABDOMINAL AORTOGRAM W/LOWER EXTREMITY;  Surgeon: Katherine Shelling, MD;  Location: [facility];  Service: Cardiovascular;  Laterality: N/A;   AMPUTATION TOE Left 10/26/2017   Procedure: AMPUTATION PARTIAL RAY LEFT FOOT AND IRRIGATION/DEBRIDEMENT;  Surgeon: Katherine [name], Katherine [name];  Location: [facility] OR;  Service: Podiatry;  Laterality: Left;  AMPUTATION PARTIAL RAY LEFT FOOT AND IRRIGATION/DEBRIDEMENT   CHOLECYSTECTOMY  01/24/2003   Katherine [name] 04/04/2011   COLONOSCOPY     INCISION AND DRAINAGE ABSCESS  03/17/2012   "right but; left lower abdoment"/notes 03/17/2012   LOWER EXTREMITY INTERVENTION N/A 09/10/2017   Procedure: LOWER EXTREMITY INTERVENTION;  Surgeon: Katherine Gess, MD;  Location: [facility];  Service: Cardiovascular;  Laterality: N/A;   PERIPHERAL VASCULAR ATHERECTOMY  09/10/2017   Procedure: PERIPHERAL VASCULAR ATHERECTOMY;  Surgeon: Katherine Gess, MD;  Location: [facility] INVASIVE CV LAB;  Service: Cardiovascular;;  left AT   PERIPHERAL VASCULAR BALLOON ANGIOPLASTY Left 04/06/2022   Procedure: PERIPHERAL VASCULAR BALLOON ANGIOPLASTY;  Surgeon: Katherine Shelling, MD;  Location: [facility];  Service: Cardiovascular;  Laterality: Left;    Current Medications: Current Meds  Medication Sig   atorvastatin (LIPITOR) 80 MG tablet Take 1 tablet (80 mg total) by mouth daily at 6 PM.   BAQSIMI ONE PACK 3 MG/DOSE POWD Place 1 spray into the nose as needed (Low blood sugar).   Blood Pressure KIT Check blood pressure twice a day Dx: I10, hypertension   carvedilol (COREG) 6.25 MG tablet Take 1 tablet (6.25 mg total) by mouth 2 (two) times daily with a meal.   clopidogrel (PLAVIX) 75 MG tablet TAKE 1 TABLET BY MOUTH EVERY DAY WITH BREAKFAST (Patient taking differently: Take 75 mg by mouth at bedtime.)   Continuous Blood Gluc Receiver (FREESTYLE LIBRE 2 READER) DEVI Use as directed 3 times daily   Continuous Blood Gluc Receiver (FREESTYLE LIBRE 2 READER) DEVI Scan as needed for continuous glucose monitoring.   Continuous Blood Gluc Sensor (FREESTYLE LIBRE 2 SENSOR) MISC Change  sensor every 2 weeks.   Continuous Blood Gluc Sensor (FREESTYLE LIBRE 2 SENSOR) MISC Inject 1 sensor to the skin every 14 days for continuous glucose monitoring.   gabapentin (NEURONTIN) 600 MG tablet Take 1 tablet (600 mg total) by mouth 2 (two) times daily. (Patient taking differently: Take 650 mg by mouth 2 (two) times daily.)   glucose blood (ONETOUCH VERIO) test strip 1 each by Misc.(Non-Drug; Combo Route) route 4 times daily before meals and nightly.   hydrALAZINE (APRESOLINE) 100 MG tablet Take 1 tablet (100 mg total) by mouth 3 (three) times daily.   insulin aspart (NOVOLOG) 100 UNIT/ML FlexPen Inject 10-17 units into the skin 3 times daily before meals. (Patient taking differently: Inject 10 Units into the skin 3 (three) times daily with meals.)   Lancets (ONETOUCH DELICA PLUS LANCET33G) MISC Use 4 times daily before meals and nightly.   Multiple Vitamins-Minerals (MULTIVITAMIN ADULT) CHEW Chew 2 tablets by mouth daily.   mupirocin cream (BACTROBAN) 2 % Apply topically daily. Apply to left foot wound and cover with dry dressing once daily (Patient taking differently: Apply 1 Application topically as needed. Apply to left foot wound and cover with dry dressing once daily)   RESTASIS  0.05 % ophthalmic emulsion Place 1 drop into both eyes 2 (two) times daily.   Semaglutide, 1 MG/DOSE, (OZEMPIC, 1 MG/DOSE,) 4 MG/3ML SOPN Inject 1 mg into the skin once a week.   TOUJEO SOLOSTAR 300 UNIT/ML Solostar Pen Inject 70 Units into the skin daily.   traMADol (ULTRAM) 50 MG tablet Take 1 tablet (50 mg total) by mouth every 12 (twelve) hours as needed.   TRUEPLUS PEN NEEDLES 31G X 5 MM MISC Use to inject Toujeo once daily.   VITAMIN D PO Take 2 tablets by mouth at bedtime.   [DISCONTINUED] amLODipine (NORVASC) 5 MG tablet Take 1 tablet (5 mg total) by mouth daily.   [DISCONTINUED] hydrochlorothiazide (HYDRODIURIL) 25 MG tablet Take 1 tablet (25 mg total) by mouth daily.   [DISCONTINUED] lisinopril  (ZESTRIL) 40 MG tablet Take 1 tablet (40 mg total) by mouth daily.   [DISCONTINUED] Olmesartan-amLODIPine-HCTZ 40-10-25 MG TABS Take 1 tablet by mouth daily.     Allergies:   Other and Metformin and related   Social History   Socioeconomic History   Marital status: Married    Spouse name: Not on file   Number of children: Not on file   Years of education: Not on file   Highest education level: Not on file  Occupational History   Not on file  Tobacco Use   Smoking status: Never    Passive exposure: Never   Smokeless tobacco: Never  Vaping Use   Vaping status: Never Used  Substance and Sexual Activity   Alcohol use: No   Drug use: No   Sexual activity: Never  Other Topics Concern   Not on file  Social History Narrative   Not on file   Social Determinants of Health   Financial Resource Strain: Low Risk  (10/05/2022)   Overall Financial Resource Strain (CARDIA)    Difficulty of Paying Living Expenses: Not hard at all  Food Insecurity: Food Insecurity Present (10/05/2022)   Hunger Vital Sign    Worried About Running Out of Food in the Last Year: Never true    Ran Out of Food in the Last Year: Sometimes true  Transportation Needs: No Transportation Needs (10/05/2022)   PRAPARE - Administrator, Civil Service (Medical): No    Lack of Transportation (Non-Medical): No  Physical Activity: Inactive (10/05/2022)   Exercise Vital Sign    Days of Exercise per Week: 0 days    Minutes of Exercise per Session: 0 min  Stress: No Stress Concern Present (10/05/2022)   [employer] of Occupational Health - Occupational Stress Questionnaire    Feeling of Stress : Only a little  Social Connections: Socially Integrated (10/05/2022)   Social Connection and Isolation Panel [NHANES]    Frequency of Communication with Friends and Family: More than three times a week    Frequency of Social Gatherings with Friends and Family: Once a week    Attends Religious Services: More  than 4 times per year    Active Member of [employer] or Organizations: Yes    Attends Engineer, structural: More than 4 times per year    Marital Status: Married     Family History: The patient's family history includes Cancer in her maternal grandmother; Diabetes in her father, paternal aunt, paternal grandfather, and paternal uncle; Hypertension in her father, maternal grandfather, mother, paternal aunt, and paternal uncle; Kidney disease in her mother. There is no history of Colon cancer or Breast cancer.  ROS:   Please  see the history of present illness.    (+) Fatigue/Malaise (+) Daytime somnolence (+) LE and foot pain (+) Intermittent LE edema All other systems reviewed and are negative.  EKGs/Labs/Other Studies Reviewed:    Echocardiogram  11/23/2022:  1. Left ventricular ejection fraction, by estimation, is 60 to 65%. Left  ventricular ejection fraction by 3D volume is 59 %. The left ventricle has  normal function. The left ventricle has no regional wall motion  abnormalities. There is moderate  concentric left ventricular hypertrophy. Left ventricular diastolic  parameters are indeterminate. Elevated left ventricular end-diastolic  pressure.   2. Right ventricular systolic function is normal. The right ventricular  size is mildly enlarged. Tricuspid regurgitation signal is inadequate for  assessing PA pressure.   3. Right atrial size was mildly dilated.   4. The mitral valve is normal in structure. Trivial mitral valve  regurgitation. No evidence of mitral stenosis.   5. The aortic valve is normal in structure. Aortic valve regurgitation is  trivial. No aortic stenosis is present.   6. The inferior vena cava is normal in size with greater than 50%  respiratory variability, suggesting right atrial pressure of 3 mmHg.   Bilateral Renal Artery Dopplers  11/15/2022: Summary:  Renal:    Right: 1-59% stenosis of the right renal artery. RRV flow present.          Abnormal right Resistive Index. Normal cortical thickness of         right kidney. Normal size right kidney.  Left:  1-59% stenosis of the left renal artery. LRV flow present.         Normal cortical thickness of the left kidney. Normal size of         left kidney. Abnormal left Resisitve Index.   ABI dopplers  11/07/2022: Bilateral ABIs and TBIs appear essentially unchanged compared to prior  study on 05/16/2022.    Summary:  Right: Resting right ankle-brachial index is within normal range. The  right toe-brachial index is abnormal.   Left: Resting left ankle-brachial index is within normal range.   LE Venous Doppler  08/07/2022: Summary:  Left:  - No evidence of deep vein thrombosis seen in the left lower extremity,  from the common femoral through the popliteal veins.  - No evidence of superficial venous thrombosis in the left lower  extremity.  - No evidence of superficial venous reflux seen in the left short  saphenous vein.  - Venous reflux is noted in the left greater saphenous vein in the thigh.   ABI  05/16/2022: Right ABIs appear essentially unchanged. Left ABIs appear increased  compared to prior study on 04/01/2022.    Summary:  Right: Resting right ankle-brachial index is within normal range. No  evidence of significant right lower extremity arterial disease. The right  toe-brachial index is abnormal.   Left: Resting left ankle-brachial index is within normal range. No  evidence of significant left lower extremity arterial disease.   LE Arterial Duplex Study  05/16/2022: Summary:  Left: Widely patent left lower extremity arterial system.  Biphasic waveforms observed in the mid and distal anterior tibial artery.   Abdominal Aortogram with Peripheral Vascular Angioplasty  04/06/2022: Pre-operative Diagnosis: Left fifth toe osteomyelitis with known peripheral arterial disease Post-operative diagnosis:  Same Surgeon:  Katherine Shelling, MD Procedure  Performed: 1.  Ultrasound-guided access right common femoral artery 2.  Aortogram with catheter selection of aorta 3.  Left lower extremity arteriogram with selection of third order branches 4.  Left anterior tibial artery angioplasty (3 mm x 100 mm Sterling) 5.  Mynx closure of the right common femoral artery 6.  57 minutes of monitored moderate conscious sedation time  Findings:  Aortogram showed patent renal arteries bilaterally with no flow-limiting stenosis in the aortoiliac segment.  The left common femoral, profunda, SFA, popliteal arteries are all widely patent.  She has posterior tibial artery occlusion.  Dominant runoff is through the anterior tibial and peroneal.  The peroneal is patent but diseased distally at the ankle and it splits into two branches.  The anterior tibial had two moderate stenosis >60% in the midportion of the vessel.  Ultimately the anterior tibial artery stenosis was crossed antegrade and angioplastied with a 3 mm Sterling.  No significant residual stenosis.  Preserved runoff and optimized from a vascular surgery standpoint.  Plan: Continue Plavix statin.  We will arrange follow-up in 1 month with arterial duplex.  Optimized for toe amputation.   EKG:  EKG is personally reviewed. 08/14/2023:  Not ordered. 06/07/2023:  Not ordered. 10/19/2022: Sinus rhythm. Rate 73 bpm.  Recent Labs: 10/31/2022: TSH 1.120 11/24/2022: ALT 13 06/18/2023: BUN 15; Creatinine, Ser 0.88; Potassium 4.3; Sodium 139   Recent Lipid Panel    Component Value Date/Time   CHOL 118 11/24/2022 1119   TRIG 61 11/24/2022 1119   HDL 61 11/24/2022 1119   CHOLHDL 1.9 11/24/2022 1119   CHOLHDL 3.8 09/19/2016 1452   VLDL 36 (H) 09/19/2016 1452   LDLCALC 44 11/24/2022 1119    Physical Exam:    VS:  BP (!) 184/78 (BP Location: Right Arm, Patient Position: Sitting, Cuff Size: Normal)   Pulse (!) 59   Ht 6' (1.829 m)   Wt 213 lb 9.6 oz (96.9 kg)   SpO2 97%   BMI 28.97 kg/m  , BMI Body mass  index is 28.97 kg/m. GENERAL:  Well appearing HEENT: Pupils equal round and reactive, fundi not visualized, oral mucosa unremarkable NECK:  No jugular venous distention, waveform within normal limits, carotid upstroke brisk and symmetric, no bruits, no thyromegaly LUNGS:  Clear to auscultation bilaterally HEART:  RRR.  PMI not displaced or sustained,S1 and S2 within normal limits, no S3, no S4, no clicks, no rubs, II/VI systolic murmur at the LUSB ABD:  Flat, positive bowel sounds normal in frequency in pitch, no bruits, no rebound, no guarding, no midline pulsatile mass, no hepatomegaly, no splenomegaly EXT:  2 plus pulses throughout, 1+ bilateral non-pitting edema, no cyanosis no clubbing SKIN:  No rashes no nodules NEURO:  Cranial nerves II through XII grossly intact, motor grossly intact throughout PSYCH:  Cognitively intact, oriented to person place and time  ASSESSMENT/PLAN:    # Resistant Hypertension Despite multiple medications, blood pressure remains elevated. Patient reports inconsistent medication regimen. -Combine amlodipine, hydrochlorothiazide, and olmesartan into Tribenzor for daily use.  She struggles with polypharmacy. Discontinue lisinopril and HCTZ. -Increase amlodipine dosage to 10mg . -Check blood pressure at home and record results. -Check labs in one week to monitor kidney function and electrolytes.  # Chronic Pain Patient reports significant leg pain, limiting mobility and exercise. Current pain management with gabapentin and tramadol is inadequate. -Refer to pain management for further evaluation and treatment.  # Fatigue Patient reports excessive tiredness and lack of energy. Possible causes include sleep apnea, anemia, or side effects from gabapentin. -Order home sleep study to evaluate for sleep apnea. -Check blood counts to evaluate for anemia. -Consider adjusting gabapentin dosage if contributing to fatigue.  # Cardiovascular  Risk Given the patient's  resistant hypertension and diabetes, there is a potential risk for cardiovascular disease. -Order stress test to evaluate cardiac function.  General Health Maintenance -Encourage patient to increase physical activity as tolerated. -Continue to monitor diet, particularly salt intake.     Lexiscan Myoview to evaluate for ischemia given her exertional dyspnea and fatigue.  She is unable to walk on a treadmill.    Screening for Secondary Hypertension:     10/19/2022   11:23 AM  Causes  Drugs/Herbals Screened     - Comments limits salt.  Does eat out some. Daily coffee x2.No EtOH.  No NSAIDS  Renovascular HTN Screened     - Comments check renal artery Doppler  Sleep Apnea Screened     - Comments Snores, AM fatigue, daytime somnolence.  Check sleep study  Thyroid Disease Screened     - Comments Check TSh  Hyperaldosteronism Screened     - Comments Check renin and aldosterone  Pheochromocytoma N/A  Cushing's Syndrome Screened     - Comments check AM cortisol    Relevant Labs/Studies:    Latest Ref Rng & Units 06/18/2023    1:03 PM 11/24/2022   11:19 AM 04/24/2022   11:52 AM  Basic Labs  Sodium 134 - 144 mmol/L 139  140  142   Potassium 3.5 - 5.2 mmol/L 4.3  4.5  3.8   Creatinine 0.57 - 1.00 mg/dL 8.11  9.14  7.82        Latest Ref Rng & Units 10/31/2022   11:06 AM 08/28/2018    2:24 PM  Thyroid   TSH 0.450 - 4.500 uIU/mL 1.120  1.370        Latest Ref Rng & Units 10/31/2022   11:06 AM  Renin/Aldosterone   Aldosterone 0.0 - 30.0 ng/dL 6.6   Aldos/Renin Ratio 0.0 - 30.0 26.0              11/15/2022   12:32 PM  Renovascular   Renal Artery [place] Completed Yes    Disposition:    FU with APP/PharmD in 6 weeks.   Medication Adjustments/Labs and Tests Ordered: Current medicines are reviewed at length with the patient today.  Concerns regarding medicines are outlined above.   Orders Placed This Encounter  Procedures   CBC with Differential/Platelet   TSH   MYOCARDIAL  PERFUSION IMAGING   [place] Sleep Study   Meds ordered this encounter  Medications   DISCONTD: Olmesartan-amLODIPine-HCTZ 40-10-25 MG TABS    Sig: Take 1 tablet by mouth daily.    Dispense:  90 tablet    Refill:  1    D/C LISINOPRIL, AMLODIPINE, AND HYDROCHLOROTHIAZIDE   Olmesartan-amLODIPine-HCTZ 40-10-25 MG TABS    Sig: Take 1 tablet by mouth daily.    Dispense:  90 tablet    Refill:  1    D/C LISINOPRIL, AMLODIPINE, AND HYDROCHLOROTHIAZIDE   I,Mathew Stumpf,acting as a scribe for Chilton Si, MD.,have documented all relevant documentation on the behalf of Chilton Si, MD,as directed by  Chilton Si, MD while in the presence of Chilton Si, MD.  I, Ansel Ferrall C. Duke Salvia, MD have reviewed all documentation for this visit.  The documentation of the exam, diagnosis, procedures, and orders on 08/15/2023 are all accurate and complete.  Signed, Chilton Si, MD  08/15/2023 9:52 AM    [facility]

## 2023-08-15 ENCOUNTER — Other Ambulatory Visit (HOSPITAL_BASED_OUTPATIENT_CLINIC_OR_DEPARTMENT_OTHER): Payer: Self-pay

## 2023-08-15 ENCOUNTER — Encounter (HOSPITAL_BASED_OUTPATIENT_CLINIC_OR_DEPARTMENT_OTHER): Payer: Self-pay | Admitting: Cardiovascular Disease

## 2023-08-15 DIAGNOSIS — R0609 Other forms of dyspnea: Secondary | ICD-10-CM | POA: Insufficient documentation

## 2023-08-15 HISTORY — DX: Other forms of dyspnea: R06.09

## 2023-08-16 ENCOUNTER — Other Ambulatory Visit (HOSPITAL_BASED_OUTPATIENT_CLINIC_OR_DEPARTMENT_OTHER): Payer: Self-pay

## 2023-08-16 ENCOUNTER — Telehealth (HOSPITAL_BASED_OUTPATIENT_CLINIC_OR_DEPARTMENT_OTHER): Payer: Self-pay | Admitting: *Deleted

## 2023-08-16 DIAGNOSIS — Z79899 Other long term (current) drug therapy: Secondary | ICD-10-CM | POA: Diagnosis not present

## 2023-08-16 DIAGNOSIS — I2510 Atherosclerotic heart disease of native coronary artery without angina pectoris: Secondary | ICD-10-CM

## 2023-08-16 DIAGNOSIS — I10 Essential (primary) hypertension: Secondary | ICD-10-CM | POA: Diagnosis not present

## 2023-08-16 NOTE — Telephone Encounter (Signed)
-----   Message from Donnie Coffin sent at 08/15/2023 11:32 AM EDT ----- Please create an Order for an ATTESTATION ( [id number]) for ordered Myoview/GXT/Stress Echocardiogram.  This must be signed by ordering Provider.  We do not accept verbal cosign.  This test will not be scheduled until ATT is obtained.

## 2023-08-16 NOTE — Telephone Encounter (Signed)
Placed and sent to Dr Duke Salvia to sign

## 2023-08-17 LAB — CBC WITH DIFFERENTIAL/PLATELET
Basophils Absolute: 0 10*3/uL (ref 0.0–0.2)
Basos: 1 %
EOS (ABSOLUTE): 0.2 10*3/uL (ref 0.0–0.4)
Eos: 4 %
Hematocrit: 43.2 % (ref 34.0–46.6)
Hemoglobin: 13.7 g/dL (ref 11.1–15.9)
Immature Grans (Abs): 0 10*3/uL (ref 0.0–0.1)
Immature Granulocytes: 0 %
Lymphocytes Absolute: 1.9 10*3/uL (ref 0.7–3.1)
Lymphs: 41 %
MCH: 28.5 pg (ref 26.6–33.0)
MCHC: 31.7 g/dL (ref 31.5–35.7)
MCV: 90 fL (ref 79–97)
Monocytes Absolute: 0.4 10*3/uL (ref 0.1–0.9)
Monocytes: 8 %
Neutrophils Absolute: 2.1 10*3/uL (ref 1.4–7.0)
Neutrophils: 46 %
Platelets: 230 10*3/uL (ref 150–450)
RBC: 4.81 x10E6/uL (ref 3.77–5.28)
RDW: 12 % (ref 11.7–15.4)
WBC: 4.6 10*3/uL (ref 3.4–10.8)

## 2023-08-17 LAB — TSH: TSH: 1.2 u[IU]/mL (ref 0.450–4.500)

## 2023-08-21 ENCOUNTER — Telehealth (HOSPITAL_COMMUNITY): Payer: Self-pay | Admitting: *Deleted

## 2023-08-21 ENCOUNTER — Encounter (HOSPITAL_COMMUNITY): Payer: Self-pay | Admitting: Cardiovascular Disease

## 2023-08-21 NOTE — Telephone Encounter (Signed)
Per DPR left detailed instructions on vm for MPI study.

## 2023-08-23 ENCOUNTER — Ambulatory Visit (HOSPITAL_COMMUNITY): Payer: 59

## 2023-08-28 ENCOUNTER — Telehealth (HOSPITAL_BASED_OUTPATIENT_CLINIC_OR_DEPARTMENT_OTHER): Payer: Self-pay

## 2023-08-28 DIAGNOSIS — E0842 Diabetes mellitus due to underlying condition with diabetic polyneuropathy: Secondary | ICD-10-CM | POA: Diagnosis not present

## 2023-08-28 DIAGNOSIS — Z79899 Other long term (current) drug therapy: Secondary | ICD-10-CM | POA: Diagnosis not present

## 2023-08-28 DIAGNOSIS — E1129 Type 2 diabetes mellitus with other diabetic kidney complication: Secondary | ICD-10-CM | POA: Diagnosis not present

## 2023-08-28 DIAGNOSIS — N390 Urinary tract infection, site not specified: Secondary | ICD-10-CM | POA: Diagnosis not present

## 2023-08-28 DIAGNOSIS — I10 Essential (primary) hypertension: Secondary | ICD-10-CM | POA: Diagnosis not present

## 2023-08-28 DIAGNOSIS — A499 Bacterial infection, unspecified: Secondary | ICD-10-CM | POA: Diagnosis not present

## 2023-08-28 DIAGNOSIS — E119 Type 2 diabetes mellitus without complications: Secondary | ICD-10-CM | POA: Diagnosis not present

## 2023-08-28 DIAGNOSIS — R809 Proteinuria, unspecified: Secondary | ICD-10-CM | POA: Diagnosis not present

## 2023-08-28 DIAGNOSIS — Z9181 History of falling: Secondary | ICD-10-CM | POA: Diagnosis not present

## 2023-08-28 NOTE — Telephone Encounter (Signed)
Patient Katherine [name] ordered 9/24 , please advise if PA required

## 2023-08-31 ENCOUNTER — Ambulatory Visit (HOSPITAL_COMMUNITY): Payer: 59 | Attending: Internal Medicine

## 2023-08-31 DIAGNOSIS — E785 Hyperlipidemia, unspecified: Secondary | ICD-10-CM | POA: Insufficient documentation

## 2023-08-31 DIAGNOSIS — Z794 Long term (current) use of insulin: Secondary | ICD-10-CM | POA: Insufficient documentation

## 2023-08-31 DIAGNOSIS — E1151 Type 2 diabetes mellitus with diabetic peripheral angiopathy without gangrene: Secondary | ICD-10-CM | POA: Insufficient documentation

## 2023-08-31 DIAGNOSIS — I701 Atherosclerosis of renal artery: Secondary | ICD-10-CM | POA: Insufficient documentation

## 2023-08-31 DIAGNOSIS — R0609 Other forms of dyspnea: Secondary | ICD-10-CM | POA: Insufficient documentation

## 2023-08-31 LAB — MYOCARDIAL PERFUSION IMAGING
LV dias vol: 140 mL (ref 46–106)
LV sys vol: 64 mL
Nuc Stress EF: 54 %
Peak HR: 84 {beats}/min
Rest HR: 59 {beats}/min
Rest Nuclear Isotope Dose: 10.6 mCi
SDS: 5
SRS: 0
SSS: 5
ST Depression (mm): 0 mm
Stress Nuclear Isotope Dose: 31.7 mCi
TID: 1.09

## 2023-08-31 MED ORDER — TECHNETIUM TC 99M TETROFOSMIN IV KIT
31.7000 | PACK | Freq: Once | INTRAVENOUS | Status: AC | PRN
  Administered 2023-08-31: 31.7 via INTRAVENOUS

## 2023-08-31 MED ORDER — REGADENOSON 0.4 MG/5ML IV SOLN
0.4000 mg | Freq: Once | INTRAVENOUS | Status: AC
  Administered 2023-08-31: 0.4 mg via INTRAVENOUS

## 2023-08-31 MED ORDER — TECHNETIUM TC 99M TETROFOSMIN IV KIT
10.6000 | PACK | Freq: Once | INTRAVENOUS | Status: AC | PRN
  Administered 2023-08-31: 10.6 via INTRAVENOUS

## 2023-09-04 ENCOUNTER — Encounter: Payer: Self-pay | Admitting: Podiatry

## 2023-09-04 ENCOUNTER — Ambulatory Visit (INDEPENDENT_AMBULATORY_CARE_PROVIDER_SITE_OTHER): Payer: 59 | Admitting: Podiatry

## 2023-09-04 VITALS — Ht 72.0 in | Wt 213.0 lb

## 2023-09-04 DIAGNOSIS — M79676 Pain in unspecified toe(s): Secondary | ICD-10-CM

## 2023-09-04 DIAGNOSIS — B351 Tinea unguium: Secondary | ICD-10-CM | POA: Diagnosis not present

## 2023-09-04 DIAGNOSIS — Z79899 Other long term (current) drug therapy: Secondary | ICD-10-CM | POA: Diagnosis not present

## 2023-09-04 DIAGNOSIS — S98112A Complete traumatic amputation of left great toe, initial encounter: Secondary | ICD-10-CM

## 2023-09-04 DIAGNOSIS — L089 Local infection of the skin and subcutaneous tissue, unspecified: Secondary | ICD-10-CM

## 2023-09-04 DIAGNOSIS — E11628 Type 2 diabetes mellitus with other skin complications: Secondary | ICD-10-CM | POA: Diagnosis not present

## 2023-09-04 DIAGNOSIS — E119 Type 2 diabetes mellitus without complications: Secondary | ICD-10-CM

## 2023-09-04 NOTE — Progress Notes (Signed)
Subjective:  Patient ID: Katherine [name], female    DOB: 08-Aug-1956,  MRN: [id number]  Chief Complaint  Patient presents with   Diabetes    Patient is here for [facility] and diabetic shoes    Discussed the use of AI scribe software for clinical note transcription with the patient, who gave verbal consent to proceed.  History of Present Illness   The patient, with a history of diabetes and a previous partial first ray amputation on the left foot, reports that her diabetes has been 'tricking up and down.' Her most recent A1c was 8.9%, down from 14% at a previous visit. She is working towards an A1c below 8% to reduce the risk of new wounds. She is interested in diabetic shoes and requires nail trimming due to dystrophic nails.          Objective:    Physical Exam   EXTREMITIES: Previous partial foot amputation on the left side well healed. Pulses palpable. Foot warm to touch. No alterations or areas of impending skin breakdown. SKIN: Nails  Thick and elongated yellow dystrophic discolored nails x9.       No images are attached to the encounter.    Results   LABS HbA1c: 8.9% (08/30/2023)      Assessment:   1. Pain due to onychomycosis of toenail   2. Amputation of left great toe (HCC)   3. Type 2 diabetes mellitus with diabetic foot infection (HCC)   4. Encounter for diabetic foot exam [facility])      Plan:  Patient was evaluated and treated and all questions answered.  Assessment and Plan    Diabetes Mellitus   Her A1c has improved from 14 to 8.9. We discussed the importance of achieving an A1c below 8 to reduce the risk of wound development. We will continue current management and aim for further improvement.  Foot Care   She has thick and elongated yellow dystrophic discolored nails with no signs of impending skin breakdown. The previous partial first ray amputation on the left side is well healed. We debrided her nails in length and thickness with a sharp nail nipper to a  comfortable level. We will schedule a follow-up with Dr. Stacie Acres in a few months and arrange an appointment for diabetic shoe fitting with Nicki Guadalajara as soon as possible.          Return in 3 months (on 12/05/2023) for at risk diabetic foot care.

## 2023-09-04 NOTE — Patient Instructions (Signed)
VISIT SUMMARY:  During your visit, we discussed your diabetes and foot care. Your diabetes has been fluctuating, but there has been an improvement in your A1c levels from 14% to 8.9%. We also discussed the importance of achieving an A1c below 8% to reduce the risk of new wounds. In terms of foot care, we trimmed your nails and noted that your previous partial first ray amputation on the left foot is well healed.  YOUR PLAN:  -DIABETES MELLITUS: Diabetes Mellitus is a condition where your body does not properly process sugar, leading to high blood sugar levels. We will continue with your current management plan to further improve your A1c levels.  -FOOT CARE: Foot care is crucial for people with diabetes as the condition can cause foot problems. We trimmed your nails to a comfortable level and will arrange an appointment for diabetic shoe fitting.  INSTRUCTIONS:  We will schedule a follow-up with Dr. Stacie Acres in a few months and arrange an appointment for diabetic shoe fitting with Nicki Guadalajara as soon as possible. Please continue to monitor your blood sugar levels and take your medications as prescribed.

## 2023-09-14 ENCOUNTER — Ambulatory Visit: Payer: 59 | Attending: Internal Medicine | Admitting: Internal Medicine

## 2023-09-14 ENCOUNTER — Encounter: Payer: Self-pay | Admitting: Internal Medicine

## 2023-09-14 VITALS — BP 125/68 | HR 69 | Ht 72.0 in | Wt 214.2 lb

## 2023-09-14 DIAGNOSIS — E1142 Type 2 diabetes mellitus with diabetic polyneuropathy: Secondary | ICD-10-CM | POA: Diagnosis not present

## 2023-09-14 DIAGNOSIS — I152 Hypertension secondary to endocrine disorders: Secondary | ICD-10-CM

## 2023-09-14 DIAGNOSIS — Z23 Encounter for immunization: Secondary | ICD-10-CM | POA: Diagnosis not present

## 2023-09-14 DIAGNOSIS — Z7985 Long-term (current) use of injectable non-insulin antidiabetic drugs: Secondary | ICD-10-CM | POA: Diagnosis not present

## 2023-09-14 DIAGNOSIS — Z1231 Encounter for screening mammogram for malignant neoplasm of breast: Secondary | ICD-10-CM | POA: Diagnosis not present

## 2023-09-14 DIAGNOSIS — E1159 Type 2 diabetes mellitus with other circulatory complications: Secondary | ICD-10-CM

## 2023-09-14 DIAGNOSIS — Z794 Long term (current) use of insulin: Secondary | ICD-10-CM | POA: Diagnosis not present

## 2023-09-14 LAB — POCT GLYCOSYLATED HEMOGLOBIN (HGB A1C): HbA1c, POC (controlled diabetic range): 10.5 % — AB (ref 0.0–7.0)

## 2023-09-14 MED ORDER — LANCETS MISC: 3 refills | Status: DC

## 2023-09-14 NOTE — Patient Instructions (Signed)
We have referred you to the nutritionist for healthy eating.  Please check your blood sugars at least twice a day before meals and bring your readings with you to see our clinical pharmacist in 3 weeks.  Please check and verify the dose of the Ozempic that you are currently taking.  The next dose up from the 1 mg should be the 2 mg once a week.  Please bring your medicines with you on your next visit for medication reconciliation.

## 2023-09-14 NOTE — Telephone Encounter (Addendum)
Ordering provider: TIFFANY Glenwood Associated diagnoses: R40.0--R06.83 WatchPAT PA obtained on 09/14/2023 by Latrelle Dodrill, CMA. Authorization: No; tracking ID NO PA REQ  Patient notified of PIN (1234) on 09/14/2023 via Notification Method: phone.  10/25   NO PA REQ FOR HST FOR [employer] Confirmation #:[id number] W SUSPENDED -PENDED

## 2023-09-14 NOTE — Progress Notes (Signed)
Patient ID: Katherine [name], female    DOB: 1956/05/06  MRN: [id number]  CC: Medical Management of Chronic Issues   Subjective: Katherine [name] is a 67 y.o. female who presents for chronic ds management. Her concerns today include:  Hx of HTN, DM with neuropathy, retinopathy and possible gastroparesis, HL, depression, GERD, PAD ( left anterior tibial angioplasty 03/2022 for left fifth toe osteomyelitis), s/p amputation LT 1st toe, BL RAS 59%.    DM: Results for orders placed or performed in visit on 09/14/23  POCT glycosylated hemoglobin (Hb A1C)  Result Value Ref Range   Hemoglobin A1C     HbA1c POC (<> result, manual entry)     HbA1c, POC (prediabetic range)     HbA1c, POC (controlled diabetic range) 10.5 (A) 0.0 - 7.0 %  A1C up almost up 2 points from last visit 3 mths -Has not seen her endocrinologist at [facility] since last visit with me.  Reports his office canceled her appt 3 times this year with last 2 being in August and September. They were suppose to call and reschedule her for this mth but have not.  She has tried calling them.  Pt getting frustrated. Seen at  [facility] by a PA, Stefani Dama who told her she would bebe happy to manage her DM.  Patient wanted to get my opinion on this first. -Currently on Toujeo 80 units, Novolog 25 units TID with meals and Ozempic.  When I last saw her in July, she was just increased to Ozempic 1 mg once a week.  She now tells me that the dose was increased 2 weeks ago by [facility] to 1.5 which I question because the next dose up from 1 mg is the 2 mg -has Libre CGM but it does not stay on for more than a day. She has not been checking manually.  Would like for me to send prescription for some lancets and she will try to check manually.  Felt low BS episode last night; felt she did not eat enough for dinner -"I cann't find the right food to eat." Loves pasta.  Having some wgh loss with the  Ozempic -In regards to her diabetic neuropathy pain, she tells me that the pain specialist at [facility] increased her gabapentin to 600 mg twice a day but looks like this is what we had left her on previously.Marland Kitchen  HTN: Saw Dr. Duke Salvia last month.  Currently on carvedilol 6.25 mg twice a day and combination pill of olmesartan-amlodipine-HCTZ 40/10/25 mg once a day.  Hydralazine still on the list as 100 mg 3 times a day but she is not sure whether she is still taking that or not.  I do not see where it has been discontinued.  She tries to limit salt in the foods. -Patient had recent stress test due to dyspnea on exertion.  This came back okay.  Dr. Duke Salvia has ordered a sleep study which she has not had as yet.  Patient Active Problem List   Diagnosis Date Noted   Exertional dyspnea 08/15/2023   Mild major depression (HCC) 11/24/2022   Murmur 10/19/2022   Medication monitoring encounter 04/10/2022   Cellulitis of left lower extremity    Neuropathy 12/08/2021   Diabetic gastroparesis associated with type 2 diabetes mellitus (HCC) 07/30/2021   Type 2 diabetes mellitus with diabetic peripheral angiopathy without gangrene, with long-term current use of insulin (HCC) 07/30/2021   Amputation of left  great toe (HCC) 06/13/2021   Microalbuminuria due to type 2 diabetes mellitus (HCC) 05/04/2021   Diabetes (HCC) 12/22/2019   Renal artery stenosis (HCC) 12/01/2019   Diabetic polyneuropathy associated with type 2 diabetes mellitus (HCC) 05/14/2018   Thyroid nodule 10/04/2017   Microcytic anemia 09/11/2017   Critical lower limb ischemia (HCC) 09/10/2017   Retinopathy 08/22/2017   Thyroid enlargement 07/12/2017   Dyslipidemia 09/20/2016   DEPRESSION 09/10/2009   LEG CRAMPS 05/19/2009   GERD 02/15/2009   Gastroparesis 09/03/2008   Dental caries 07/14/2008   Essential hypertension 09/16/2003     Current Outpatient Medications on File Prior to Visit  Medication Sig Dispense Refill    atorvastatin (LIPITOR) 80 MG tablet Take 1 tablet (80 mg total) by mouth daily at 6 PM. 90 tablet 2   BAQSIMI ONE PACK 3 MG/DOSE POWD Place 1 spray into the nose as needed (Low blood sugar).     Blood Pressure KIT Check blood pressure twice a day Dx: I10, hypertension 1 kit 0   Carboxymethylcellulose Sodium (EYE DROPS OP) Place 1 drop into both eyes daily.     carvedilol (COREG) 6.25 MG tablet Take 1 tablet (6.25 mg total) by mouth 2 (two) times daily with a meal. 180 tablet 2   clopidogrel (PLAVIX) 75 MG tablet TAKE 1 TABLET BY MOUTH EVERY DAY WITH BREAKFAST (Patient taking differently: Take 75 mg by mouth at bedtime.) 90 tablet 3   Continuous Blood Gluc Receiver (FREESTYLE LIBRE 2 READER) DEVI Use as directed 3 times daily 1 each 12   Continuous Blood Gluc Receiver (FREESTYLE LIBRE 2 READER) DEVI Scan as needed for continuous glucose monitoring. 1 each 0   Continuous Blood Gluc Sensor (FREESTYLE LIBRE 2 SENSOR) MISC Change sensor every 2 weeks. 2 each 12   Continuous Blood Gluc Sensor (FREESTYLE LIBRE 2 SENSOR) MISC Inject 1 sensor to the skin every 14 days for continuous glucose monitoring. 2 each 11   gabapentin (NEURONTIN) 600 MG tablet Take 1 tablet (600 mg total) by mouth 2 (two) times daily. (Patient taking differently: Take 650 mg by mouth 2 (two) times daily.) 60 tablet 6   glucose blood (ONETOUCH VERIO) test strip 1 each by Misc.(Non-Drug; Combo Route) route 4 times daily before meals and nightly. 400 each 3   hydrALAZINE (APRESOLINE) 100 MG tablet Take 1 tablet (100 mg total) by mouth 3 (three) times daily. 270 tablet 2   insulin aspart (NOVOLOG) 100 UNIT/ML FlexPen Inject 10-17 units into the skin 3 times daily before meals. (Patient taking differently: Inject 10 Units into the skin 3 (three) times daily with meals.) 45 mL 3   Lancets (ONETOUCH DELICA PLUS LANCET33G) MISC Use 4 times daily before meals and nightly. 400 each 3   Multiple Vitamins-Minerals (MULTIVITAMIN ADULT) CHEW Chew 2  tablets by mouth daily.     mupirocin cream (BACTROBAN) 2 % Apply topically daily. Apply to left foot wound and cover with dry dressing once daily (Patient taking differently: Apply 1 Application topically as needed. Apply to left foot wound and cover with dry dressing once daily) 30 g 0   Olmesartan-amLODIPine-HCTZ 40-10-25 MG TABS Take 1 tablet by mouth daily. 90 tablet 1   RESTASIS 0.05 % ophthalmic emulsion Place 1 drop into both eyes 2 (two) times daily.     Semaglutide, 1 MG/DOSE, (OZEMPIC, 1 MG/DOSE,) 4 MG/3ML SOPN Inject 1 mg into the skin once a week.     TOUJEO SOLOSTAR 300 UNIT/ML Solostar Pen Inject 70 Units into the  skin daily.     traMADol (ULTRAM) 50 MG tablet Take 1 tablet (50 mg total) by mouth every 12 (twelve) hours as needed. 14 tablet 1   TRUEPLUS PEN NEEDLES 31G X 5 MM MISC Use to inject Toujeo once daily. 100 each 2   VITAMIN D PO Take 2 tablets by mouth at bedtime.     No current facility-administered medications on file prior to visit.    Allergies  Allergen Reactions   Other Swelling    Seaweed= swelling on arms, hands and face   Metformin And Related Nausea Only    Social History   Socioeconomic History   Marital status: Married    Spouse name: Not on file   Number of children: Not on file   Years of education: Not on file   Highest education level: Not on file  Occupational History   Not on file  Tobacco Use   Smoking status: Never    Passive exposure: Never   Smokeless tobacco: Never  Vaping Use   Vaping status: Never Used  Substance and Sexual Activity   Alcohol use: No   Drug use: No   Sexual activity: Never  Other Topics Concern   Not on file  Social History Narrative   Not on file   Social Determinants of Health   Financial Resource Strain: Low Risk  (10/05/2022)   Overall Financial Resource Strain (CARDIA)    Difficulty of Paying Living Expenses: Not hard at all  Food Insecurity: Food Insecurity Present (10/05/2022)   Hunger Vital  Sign    Worried About Running Out of Food in the Last Year: Never true    Ran Out of Food in the Last Year: Sometimes true  Transportation Needs: No Transportation Needs (10/05/2022)   PRAPARE - Administrator, Civil Service (Medical): No    Lack of Transportation (Non-Medical): No  Physical Activity: Inactive (10/05/2022)   Exercise Vital Sign    Days of Exercise per Week: 0 days    Minutes of Exercise per Session: 0 min  Stress: No Stress Concern Present (10/05/2022)   [employer] of Occupational Health - Occupational Stress Questionnaire    Feeling of Stress : Only a little  Social Connections: Socially Integrated (10/05/2022)   Social Connection and Isolation Panel [NHANES]    Frequency of Communication with Friends and Family: More than three times a week    Frequency of Social Gatherings with Friends and Family: Once a week    Attends Religious Services: More than 4 times per year    Active Member of [employer] or Organizations: Yes    Attends Engineer, structural: More than 4 times per year    Marital Status: Married  Catering manager Violence: Not At Risk (10/05/2022)   Humiliation, Afraid, Rape, and Kick questionnaire    Fear of Current or Ex-Partner: No    Emotionally Abused: No    Physically Abused: No    Sexually Abused: No    Family History  Problem Relation Age of Onset   Kidney disease Mother    Hypertension Mother    Diabetes Father    Hypertension Father    Hypertension Paternal Aunt    Diabetes Paternal Aunt    Hypertension Paternal Uncle    Diabetes Paternal Uncle    Cancer Maternal Grandmother    Hypertension Maternal Grandfather    Diabetes Paternal Grandfather    Colon cancer Neg Hx    Breast cancer Neg Hx  Past Surgical History:  Procedure Laterality Date   ABDOMINAL AORTOGRAM W/LOWER EXTREMITY N/A 04/06/2022   Procedure: ABDOMINAL AORTOGRAM W/LOWER EXTREMITY;  Surgeon: Cephus Shelling, MD;  Location: [facility] INVASIVE CV  LAB;  Service: Cardiovascular;  Laterality: N/A;   AMPUTATION TOE Left 10/26/2017   Procedure: AMPUTATION PARTIAL RAY LEFT FOOT AND IRRIGATION/DEBRIDEMENT;  Surgeon: Felecia Shelling, DPM;  Location: [facility] OR;  Service: Podiatry;  Laterality: Left;  AMPUTATION PARTIAL RAY LEFT FOOT AND IRRIGATION/DEBRIDEMENT   CHOLECYSTECTOMY  01/24/2003   [name] 04/04/2011   COLONOSCOPY     INCISION AND DRAINAGE ABSCESS  03/17/2012   "right but; left lower abdoment"/notes 03/17/2012   LOWER EXTREMITY INTERVENTION N/A 09/10/2017   Procedure: LOWER EXTREMITY INTERVENTION;  Surgeon: Runell Gess, MD;  Location: [facility] INVASIVE CV LAB;  Service: Cardiovascular;  Laterality: N/A;   PERIPHERAL VASCULAR ATHERECTOMY  09/10/2017   Procedure: PERIPHERAL VASCULAR ATHERECTOMY;  Surgeon: Runell Gess, MD;  [facility] INVASIVE CV LAB;  Service: Cardiovascular;;  left AT   PERIPHERAL VASCULAR BALLOON ANGIOPLASTY Left 04/06/2022   Procedure: PERIPHERAL VASCULAR BALLOON ANGIOPLASTY;  Surgeon: Cephus Shelling, MD;  Location: [facility] INVASIVE CV LAB;  Service: Cardiovascular;  Laterality: Left;    ROS: Review of Systems Negative except as stated above  PHYSICAL EXAM: BP 125/68   Pulse 69   Ht 6' (1.829 m)   Wt 214 lb 3.2 oz (97.2 kg)   SpO2 98%   BMI 29.05 kg/m   Wt Readings from Last 3 Encounters:  09/14/23 214 lb 3.2 oz (97.2 kg)  09/04/23 213 lb (96.6 kg)  08/31/23 213 lb (96.6 kg)    Physical Exam  General appearance - alert, well appearing, and in no distress Mental status - normal mood, behavior, speech, dress, motor activity, and thought processes Chest - clear to auscultation, no wheezes, rales or rhonchi, symmetric air entry Heart - normal rate, regular rhythm, normal S1, S2, no murmurs, rubs, clicks or gallops Extremities -surprisingly today she does not have any lower extremity edema.  She is wearing compression socks. Diabetic Foot Exam - Simple   Simple Foot Form Diabetic Foot exam was performed  with the following findings: Yes 09/14/2023  3:18 PM  Visual Inspection See comments: Yes Sensation Testing See comments: Yes Pulse Check See comments: Yes Comments Patient is flat-footed.  Toenails are discolored and thick but not overgrown.  She has decreased sensation on the soles of both feet.  Dorsalis pedis and posterior tibialis pulses 2+ bilaterally.          Latest Ref Rng & Units 06/18/2023    1:03 PM 11/24/2022   11:19 AM 04/24/2022   11:52 AM  CMP  Glucose 70 - 99 mg/dL 811  914  782   BUN 8 - 27 mg/dL 15  16  18    Creatinine 0.57 - 1.00 mg/dL 9.56  2.13  0.86   Sodium 134 - 144 mmol/L 139  140  142   Potassium 3.5 - 5.2 mmol/L 4.3  4.5  3.8   Chloride 96 - 106 mmol/L 97  99  102   CO2 20 - 29 mmol/L 31  26  33   Calcium 8.7 - 10.3 mg/dL 9.4  9.7  9.4   Total Protein 6.0 - 8.5 g/dL  6.9  7.0   Total Bilirubin 0.0 - 1.2 mg/dL  0.5  0.6   Alkaline Phos 44 - 121 IU/L  144    AST 0 - 40 IU/L  15  10  ALT 0 - 32 IU/L  13  8    Lipid Panel     Component Value Date/Time   CHOL 118 11/24/2022 1119   TRIG 61 11/24/2022 1119   HDL 61 11/24/2022 1119   CHOLHDL 1.9 11/24/2022 1119   CHOLHDL 3.8 09/19/2016 1452   VLDL 36 (H) 09/19/2016 1452   LDLCALC 44 11/24/2022 1119    CBC    Component Value Date/Time   WBC 4.6 08/16/2023 1015   WBC 4.6 04/24/2022 1152   RBC 4.81 08/16/2023 1015   RBC 4.29 04/24/2022 1152   HGB 13.7 08/16/2023 1015   HCT 43.2 08/16/2023 1015   PLT 230 08/16/2023 1015   MCV 90 08/16/2023 1015   MCH 28.5 08/16/2023 1015   MCH 28.0 04/24/2022 1152   MCHC 31.7 08/16/2023 1015   MCHC 32.2 04/24/2022 1152   RDW 12.0 08/16/2023 1015   LYMPHSABS 1.9 08/16/2023 1015   MONOABS 0.7 04/01/2022 0132   EOSABS 0.2 08/16/2023 1015   BASOSABS 0.0 08/16/2023 1015    ASSESSMENT AND PLAN:  1. Type 2 diabetes mellitus with peripheral neuropathy (HCC) Not at goal. Patient does not have any blood sugar readings to share for [place] to make decision about  changes in her insulin.  We have tried her with CGM's but she has had no success with getting them to stay in place.  Prescription sent for lancets.  I request that she checks her blood sugars at least twice a day before meals and record readings.  We will have her follow-up with our clinical pharmacist in about 3 weeks for recheck. She will continue Toujeo 80 units daily, NovoLog 20 units with meals and Ozempic.  I have told her to check the prescription that she has at home for [facility] and call me back to let me know the dose instruction that is on the box.  I suspect that the pain management specialist actually inadvertently decreased her dose to 0.5 when she was on the 1 mg from her endocrinologist.   -Since appointments with [facility] endocrinology have been inconsistent due to them canceling the appointments, we agreed to try to get her in with [facility] endocrinology -She is agreeable to seeing a nutritionist - POCT glycosylated hemoglobin (Hb A1C) - Ambulatory referral to Endocrinology - Lancets MISC; Please dispense the Single/disposable Lancets.  Check BS 4x/day  Dispense: 100 each; Refill: 3 - Amb ref to Medical Nutrition Therapy-MNT  2. Long-term (current) use of injectable non-insulin antidiabetic drugs See #1 above  3. Insulin long-term use (HCC) See #1 above  4. Hypertension associated with diabetes (HCC) Controlled.  Continue carvedilol 6.25 mg twice a day, the combination of olmesartan-Norvasc-HCTZ once a day.  She will check to see whether she still has hydralazine at home and is taking or not.  I do not see where this was discontinued by Dr. Duke Salvia.  Advised to bring all of her medicines with her on her next visit.  5. Encounter for screening mammogram for malignant neoplasm of breast - MM 3D SCREENING MAMMOGRAM BILATERAL BREAST; Future  6. Encounter for immunization - Flu Vaccine Trivalent High Dose (Fluad)    Patient was given the opportunity to ask questions.  Patient  verbalized understanding of the plan and was able to repeat key elements of the plan.   This documentation was completed using Paediatric nurse.  Any transcriptional errors are unintentional.  Orders Placed This Encounter  Procedures   POCT glycosylated hemoglobin (Hb A1C)  Requested Prescriptions    No prescriptions requested or ordered in this encounter    No follow-ups on file.  Jonah Blue, MD, FACP

## 2023-09-24 ENCOUNTER — Encounter: Payer: 59 | Attending: Internal Medicine | Admitting: Dietician

## 2023-09-24 DIAGNOSIS — Z713 Dietary counseling and surveillance: Secondary | ICD-10-CM | POA: Insufficient documentation

## 2023-09-24 DIAGNOSIS — E1142 Type 2 diabetes mellitus with diabetic polyneuropathy: Secondary | ICD-10-CM | POA: Insufficient documentation

## 2023-09-24 NOTE — Progress Notes (Signed)
Diabetes Self-Management Education  Visit Type: First/Initial  Appt. Start Time: 1720  Appt. End Time: 1625  09/24/2023  [name], identified by name and date of birth, is a 67 y.o. female with a diagnosis of Diabetes: Type 2.   ASSESSMENT  Patient is here today alone. Patient would like to learn more about CGM sensor placement and to lower her A1C. Pt reports she has left her reader at home.   Patient lives with husband.  Pt does the shopping and cooking. Pt reports decrease in appetite with GLP. Pt reports difficulty with lancing device and obtaining finger pricks stating unknown brand.  Pt reports typical intake of 2 meals daily and states skipping breakfast daily.     History includes:   Past Medical History:  Diagnosis Date   Anemia    requiring transfusion   Boil of buttock ~ 2016   Depression    Diabetic neuropathy (HCC)    [name] 08/23/2017   DKA (diabetic ketoacidoses)    recent/notes 01/06/2015   Exertional dyspnea 08/15/2023   Hyperlipidemia    Hypertension    Murmur 10/19/2022   Neuropathy    Peripheral vascular disease (HCC)    Toe ulcer (HCC)    left great toe/notes 08/23/2017   Type II diabetes mellitus (HCC)     Labs noted:   Lab Results  Component Value Date   HGBA1C 10.5 (A) 09/14/2023    Medications include:   Current Outpatient Medications:    atorvastatin (LIPITOR) 80 MG tablet, Take 1 tablet (80 mg total) by mouth daily at 6 PM., Disp: 90 tablet, Rfl: 2   BAQSIMI ONE PACK 3 MG/DOSE POWD, Place 1 spray into the nose as needed (Low blood sugar)., Disp: , Rfl:    Blood Pressure KIT, Check blood pressure twice a day Dx: I10, hypertension, Disp: 1 kit, Rfl: 0   Carboxymethylcellulose Sodium (EYE DROPS OP), Place 1 drop into both eyes daily., Disp: , Rfl:    carvedilol (COREG) 6.25 MG tablet, Take 1 tablet (6.25 mg total) by mouth 2 (two) times daily with a meal., Disp: 180 tablet, Rfl: 2   clopidogrel (PLAVIX) 75 MG tablet, TAKE 1 TABLET BY  MOUTH EVERY DAY WITH BREAKFAST (Patient taking differently: Take 75 mg by mouth at bedtime.), Disp: 90 tablet, Rfl: 3   Continuous Blood Gluc Receiver (FREESTYLE LIBRE 2 READER) DEVI, Use as directed 3 times daily, Disp: 1 each, Rfl: 12   Continuous Blood Gluc Sensor (FREESTYLE LIBRE 2 SENSOR) MISC, Inject 1 sensor to the skin every 14 days for continuous glucose monitoring., Disp: 2 each, Rfl: 11   gabapentin (NEURONTIN) 600 MG tablet, Take 1 tablet (600 mg total) by mouth 2 (two) times daily. (Patient taking differently: Take 650 mg by mouth 2 (two) times daily.), Disp: 60 tablet, Rfl: 6   hydrALAZINE (APRESOLINE) 100 MG tablet, Take 1 tablet (100 mg total) by mouth 3 (three) times daily., Disp: 270 tablet, Rfl: 2   Multiple Vitamins-Minerals (MULTIVITAMIN ADULT) CHEW, Chew 2 tablets by mouth daily., Disp: , Rfl:    Olmesartan-amLODIPine-HCTZ 40-10-25 MG TABS, Take 1 tablet by mouth daily., Disp: 90 tablet, Rfl: 1   Semaglutide, 1 MG/DOSE, (OZEMPIC, 1 MG/DOSE,) 4 MG/3ML SOPN, Inject 1 mg into the skin once a week., Disp: , Rfl:    TOUJEO SOLOSTAR 300 UNIT/ML Solostar Pen, Inject 70 Units into the skin daily., Disp: , Rfl:    Continuous Blood Gluc Receiver (FREESTYLE LIBRE 2 READER) DEVI, Scan as needed for continuous  glucose monitoring., Disp: 1 each, Rfl: 0   Continuous Blood Gluc Sensor (FREESTYLE LIBRE 2 SENSOR) MISC, Change sensor every 2 weeks., Disp: 2 each, Rfl: 12   glucose blood (ONETOUCH VERIO) test strip, 1 each by Misc.(Non-Drug; Combo Route) route 4 times daily before meals and nightly., Disp: 400 each, Rfl: 3   insulin aspart (NOVOLOG) 100 UNIT/ML FlexPen, Inject 10-17 units into the skin 3 times daily before meals. (Patient taking differently: Inject 10 Units into the skin 3 (three) times daily with meals.), Disp: 45 mL, Rfl: 3   Lancets (ONETOUCH DELICA PLUS LANCET33G) MISC, Use 4 times daily before meals and nightly., Disp: 400 each, Rfl: 3   Lancets MISC, Please dispense the  Single/disposable Lancets.  Check BS 4x/day, Disp: 100 each, Rfl: 3   mupirocin cream (BACTROBAN) 2 %, Apply topically daily. Apply to left foot wound and cover with dry dressing once daily (Patient taking differently: Apply 1 Application topically as needed. Apply to left foot wound and cover with dry dressing once daily), Disp: 30 g, Rfl: 0   RESTASIS 0.05 % ophthalmic emulsion, Place 1 drop into both eyes 2 (two) times daily., Disp: , Rfl:    traMADol (ULTRAM) 50 MG tablet, Take 1 tablet (50 mg total) by mouth every 12 (twelve) hours as needed. (Patient not taking: Reported on 09/24/2023), Disp: 14 tablet, Rfl: 1   TRUEPLUS PEN NEEDLES 31G X 5 MM MISC, Use to inject Toujeo once daily., Disp: 100 each, Rfl: 2   VITAMIN D PO, Take 2 tablets by mouth at bedtime., Disp: , Rfl:    BMI Readings from Last 3 Encounters:  09/14/23 29.05 kg/m  09/04/23 28.89 kg/m  08/31/23 28.89 kg/m    There were no vitals taken for this visit. There is no height or weight on file to calculate BMI.   Diabetes Self-Management Education - 09/24/23 1628       Visit Information   Visit Type First/Initial      Initial Visit   Diabetes Type Type 2    Date Diagnosed 20 years ago    Are you currently following a meal plan? No    Are you taking your medications as prescribed? Yes      Health Coping   How would you rate your overall health? Fair      Psychosocial Assessment   Patient Belief/Attitude about Diabetes Defeat/Burnout    What is the hardest part about your diabetes right now, causing you the most concern, or is the most worrisome to you about your diabetes?   Checking blood sugar;Making healty food and beverage choices    Self-care barriers None    Self-management support Doctor's office    Other persons present Patient    Patient Concerns Nutrition/Meal planning;Problem Solving;Glycemic Control;Support    Special Needs None    Preferred Learning Style No preference indicated    How often do you  need to have someone help you when you read instructions, pamphlets, or other written materials from your doctor or pharmacy? 1 - Never    What is the last grade level you completed in school? college      Pre-Education Assessment   Patient understands the diabetes disease and treatment process. Needs Instruction    Patient understands incorporating nutritional management into lifestyle. Needs Instruction    Patient undertands incorporating physical activity into lifestyle. Needs Instruction    Patient understands using medications safely. Needs Instruction    Patient understands monitoring blood glucose, interpreting and using  results Needs Instruction    Patient understands prevention, detection, and treatment of acute complications. Needs Instruction    Patient understands prevention, detection, and treatment of chronic complications. Needs Instruction    Patient understands how to develop strategies to address psychosocial issues. Needs Instruction    Patient understands how to develop strategies to promote health/change behavior. Needs Instruction      Complications   Last HgB A1C per patient/outside source 10.5 %    How often do you check your blood sugar? --   has CGM not using consistently   Number of hypoglycemic episodes per month 0    Number of hyperglycemic episodes ( >200mg /dL): Occasional    Can you tell when your blood sugar is high? No    Have you had a dilated eye exam in the past 12 months? Yes    Have you had a dental exam in the past 12 months? Yes    Are you checking your feet? Yes    How many days per week are you checking your feet? 7      Dietary Intake   Breakfast skips or, 1 cup stewed apple,  biscuit, 1/2 cup 2% milk    Lunch skips or salad, 1/2 cup peaches, 1/2 fried potatoes, roast with gravy, peach soda    Snack (afternoon) graham cracker, peanut butter    Dinner liver, onions, gravy, rice, green beans    Snack (evening) fruit    Beverage(s) 2%,  occassionally peach soda, water, SF propel      Activity / Exercise   Activity / Exercise Type ADL's    How many days per week do you exercise? 0    How many minutes per day do you exercise? 0    Total minutes per week of exercise 0      Patient Education   Previous Diabetes Education No    Disease Pathophysiology Definition of diabetes, type 1 and 2, and the diagnosis of diabetes;Explored patient's options for treatment of their diabetes    Healthy Eating Plate Method;Meal timing in regards to the patients' current diabetes medication.;Role of diet in the treatment of diabetes and the relationship between the three main macronutrients and blood glucose level    Being Active Role of exercise on diabetes management, blood pressure control and cardiac health.    Medications Reviewed patients medication for diabetes, action, purpose, timing of dose and side effects.    Monitoring Daily foot exams;Yearly dilated eye exam;Identified appropriate SMBG and/or A1C goals.;Taught/evaluated CGM (comment);Taught/evaluated SMBG meter.    Acute complications Taught prevention, symptoms, and  treatment of hypoglycemia - the 15 rule.;Educated on sick day management    Chronic complications Retinopathy and reason for yearly dilated eye exams;Identified and discussed with patient  current chronic complications;Relationship between chronic complications and blood glucose control    Diabetes Stress and Support Identified and addressed patients feelings and concerns about diabetes;Worked with patient to identify barriers to care and solutions;Role of stress on diabetes;Brainstormed with patient on coping mechanisms for social situations, getting support from significant others, dealing with feelings about diabetes    Lifestyle and Health Coping Helped patient develop diabetes management plan for (enter comment)      Individualized Goals (developed by patient)   Nutrition Follow meal plan discussed    Physical  Activity Exercise 3-5 times per week;15 minutes per day    Medications take my medication as prescribed    Monitoring  Consistenly use CGM    Problem Solving Eating Pattern;Medication  consistency;Addressing barriers to behavior change    Reducing Risk treat hypoglycemia with 15 grams of carbs if blood glucose less than 70mg /dL;do foot checks daily    Health Coping Ask for help with psychological, social, or emotional issues      Post-Education Assessment   Patient understands the diabetes disease and treatment process. Needs Instruction    Patient understands incorporating nutritional management into lifestyle. Needs Instruction    Patient undertands incorporating physical activity into lifestyle. Needs Instruction    Patient understands using medications safely. Needs Instruction    Patient understands monitoring blood glucose, interpreting and using results Needs Instruction    Patient understands prevention, detection, and treatment of acute complications. Needs Instruction    Patient understands prevention, detection, and treatment of chronic complications. Needs Instruction    Patient understands how to develop strategies to address psychosocial issues. Needs Instruction    Patient understands how to develop strategies to promote health/change behavior. Needs Instruction      Outcomes   Expected Outcomes Demonstrated interest in learning. Expect positive outcomes    Future DMSE 2 months    Program Status Not Completed             Individualized Plan for Diabetes Self-Management Training:   Learning Objective:  Patient will have a greater understanding of diabetes self-management. Patient education plan is to attend individual and/or group sessions per assessed needs and concerns.   Plan:   There are no Patient Instructions on file for this visit.  Expected Outcomes:  Demonstrated interest in learning. Expect positive outcomes  Education material provided: ADA - How to  Thrive: A Guide for Your Journey with Diabetes, My Plate, and Snack sheet  If problems or questions, patient to contact team via:  Phone  Future DSME appointment: 2 months

## 2023-09-25 DIAGNOSIS — I10 Essential (primary) hypertension: Secondary | ICD-10-CM | POA: Diagnosis not present

## 2023-09-25 DIAGNOSIS — Z9181 History of falling: Secondary | ICD-10-CM | POA: Diagnosis not present

## 2023-09-25 DIAGNOSIS — E0842 Diabetes mellitus due to underlying condition with diabetic polyneuropathy: Secondary | ICD-10-CM | POA: Diagnosis not present

## 2023-09-25 DIAGNOSIS — E1129 Type 2 diabetes mellitus with other diabetic kidney complication: Secondary | ICD-10-CM | POA: Diagnosis not present

## 2023-09-25 DIAGNOSIS — Z79899 Other long term (current) drug therapy: Secondary | ICD-10-CM | POA: Diagnosis not present

## 2023-09-25 DIAGNOSIS — R809 Proteinuria, unspecified: Secondary | ICD-10-CM | POA: Diagnosis not present

## 2023-09-27 ENCOUNTER — Telehealth: Payer: Self-pay | Admitting: Internal Medicine

## 2023-09-27 NOTE — Telephone Encounter (Signed)
Pt is calling in because she says Dr. Laural Benes called her with questions about her insulin. Pt is calling in to let Dr. Laural Benes know it's Ozempic 0.5 and 0.25 MG, pt says there is no 200.

## 2023-10-03 NOTE — Telephone Encounter (Signed)
Pt phone call message noted.  Now please inquire what dose has she been taking over the past month and is she tolerating the dose.

## 2023-10-03 NOTE — Telephone Encounter (Signed)
Her endocrinologist had her on a higher dose.  SO increase dose to 0.5 mg once a week.  If she is not sure how to dial up to the 0.5 mg, set her up with Franky Macho so he can show her how to do it.

## 2023-10-03 NOTE — Telephone Encounter (Signed)
Called & spoke the patient. Verified name & DOB. Patient stated that she is taking the Ozempic 0.25 for the last 2 weeks. Patient stated that she is tolerating and has not seen not any changes.

## 2023-10-04 NOTE — Telephone Encounter (Signed)
Called & spoke to the patient. Verified name & DOB. Informed to start the 0.5 mg for Ozempic per the endocrinologist's instruction. Patient expressed verbal understanding. Patient declined to see Franky Macho, she stated that she is able to dial up the pen. No further assistance required at this time.

## 2023-10-09 ENCOUNTER — Ambulatory Visit: Payer: 59 | Attending: Internal Medicine

## 2023-10-11 ENCOUNTER — Ambulatory Visit
Admission: RE | Admit: 2023-10-11 | Discharge: 2023-10-11 | Disposition: A | Discharge status: Home / Self Care | Payer: 59 | Source: Ambulatory Visit | Attending: Internal Medicine | Admitting: Internal Medicine

## 2023-10-11 DIAGNOSIS — Z1231 Encounter for screening mammogram for malignant neoplasm of breast: Secondary | ICD-10-CM | POA: Diagnosis not present

## 2023-10-15 ENCOUNTER — Telehealth: Payer: Self-pay

## 2023-10-15 ENCOUNTER — Other Ambulatory Visit: Payer: 59

## 2023-10-15 NOTE — Telephone Encounter (Signed)
Pt was called and is aware of results, DOB was confirmed.  ?

## 2023-10-15 NOTE — Telephone Encounter (Signed)
-----   Message from [facility] Verdella Laidlaw M sent at 10/15/2023  8:41 AM EST -----  ----- Message ----- From: Marcine Matar, MD Sent: 10/12/2023   8:16 PM EST To: Johna Roles, CMA  Mammogram is normal.  Will  be due again in 1 year.

## 2023-10-16 ENCOUNTER — Other Ambulatory Visit: Payer: Self-pay | Admitting: Internal Medicine

## 2023-10-16 ENCOUNTER — Ambulatory Visit: Payer: 59

## 2023-10-16 ENCOUNTER — Encounter (HOSPITAL_BASED_OUTPATIENT_CLINIC_OR_DEPARTMENT_OTHER): Payer: Self-pay | Admitting: *Deleted

## 2023-10-16 DIAGNOSIS — E11628 Type 2 diabetes mellitus with other skin complications: Secondary | ICD-10-CM

## 2023-10-16 DIAGNOSIS — E08621 Diabetes mellitus due to underlying condition with foot ulcer: Secondary | ICD-10-CM

## 2023-10-16 DIAGNOSIS — L089 Local infection of the skin and subcutaneous tissue, unspecified: Secondary | ICD-10-CM

## 2023-10-16 DIAGNOSIS — S98112A Complete traumatic amputation of left great toe, initial encounter: Secondary | ICD-10-CM

## 2023-10-16 NOTE — Telephone Encounter (Signed)
Medication Refill -  Most Recent Primary Care Visit:  Provider: Mickeal Needy  Department: [facility]  Visit Type: MEDICARE AWV, INITIAL  Date: 10/09/2023  Medication: Verio test strips  Has the patient contacted their pharmacy? Yes (Agent: If no, request that the patient contact the pharmacy for the refill. If patient does not wish to contact the pharmacy document the reason why and proceed with request.) (Agent: If yes, when and what did the pharmacy advise?)  Is this the correct pharmacy for this prescription? Yes If no, delete pharmacy and type the correct one.  This is the patient's preferred pharmacy:  [id number] [place], [place] - [address] [place] [address] Phone: [phone] Fax: [phone]  [facility] [address] [place] [address] Phone: [phone] Fax: [phone]   Has the prescription been filled recently? Yes  Is the patient out of the medication? Yes  Has the patient been seen for an appointment in the last year OR does the patient have an upcoming appointment? Yes  Can we respond through MyChart? No  Agent: Please be advised that Rx refills may take up to 3 business days. We ask that you follow-up with your pharmacy.

## 2023-10-16 NOTE — Progress Notes (Signed)
Patient presents to the office today for diabetic shoe and insole measuring.  Patient was measured with brannock device to determine size and width for 1 pair of extra depth shoes and foam casted for 3 pair of insoles.   Documentation of medical necessity will be sent to patient's treating diabetic doctor to verify and sign.   Patient's diabetic provider: Marcine Matar MD   Shoes and insoles will be ordered at that time and patient will be notified for an appointment for fitting when they arrive.   Shoe size (per patient): 11W Brannock measurement: 12 Patient shoe selection- Shoe choice:   A720W A700W ,X801w / BROOKS BLK  Shoe size ordered: 11W Financials Signed  Addison Bailey Cped, CFo, CFm Patient insisting on SZ 11  Emary Zalar CPed, CFo, CFm

## 2023-10-17 MED ORDER — ONETOUCH VERIO VI STRP: ORAL_STRIP | 0 refills | Status: DC

## 2023-10-17 NOTE — Telephone Encounter (Signed)
Requested medication (s) are due for refill today: yes  Requested medication (s) are on the active medication list: yes  Last refill:  09/09/21 400 each 3 RF  Future visit scheduled: yes  Notes to clinic:  last reordered 2022   Requested Prescriptions  Pending Prescriptions Disp Refills   glucose blood (ONETOUCH VERIO) test strip 400 each 3    Sig: 1 each by Misc.(Non-Drug; Combo Route) route 4 times daily before meals and nightly.     Endocrinology: Diabetes - Testing Supplies Passed - 10/16/2023  5:22 PM      Passed - Valid encounter within last 12 months    Recent Outpatient Visits           1 month ago Type 2 diabetes mellitus with peripheral neuropathy (HCC)   [facility] [place]. [facility] Jonah Blue B, MD   4 months ago Type 2 diabetes mellitus with peripheral neuropathy [facility])   [place] [facility] - [facility] Dept Of [place]. [facility] Jonah Blue B, MD   10 months ago Type 2 diabetes mellitus with peripheral neuropathy [facility])   [place] [facility] - A Dept Of [place]. [facility] Marcine Matar, MD   1 year ago Encounter for [employer] annual wellness exam   [place] [facility] A Dept Of [place]. [facility] Marcine Matar, MD   1 year ago Type 2 diabetes mellitus with peripheral neuropathy [facility])   [place] [facility] - A Dept Of [place]. [facility] Marcine Matar, MD       Future Appointments             In 2 months Laural Benes Binnie Rail, MD [facility] [place] - A Dept Of [place]. [facility]

## 2023-10-25 DIAGNOSIS — Z79899 Other long term (current) drug therapy: Secondary | ICD-10-CM | POA: Diagnosis not present

## 2023-10-25 DIAGNOSIS — E1129 Type 2 diabetes mellitus with other diabetic kidney complication: Secondary | ICD-10-CM | POA: Diagnosis not present

## 2023-10-25 DIAGNOSIS — I10 Essential (primary) hypertension: Secondary | ICD-10-CM | POA: Diagnosis not present

## 2023-10-25 DIAGNOSIS — Z9181 History of falling: Secondary | ICD-10-CM | POA: Diagnosis not present

## 2023-10-25 DIAGNOSIS — E119 Type 2 diabetes mellitus without complications: Secondary | ICD-10-CM | POA: Diagnosis not present

## 2023-10-25 DIAGNOSIS — R809 Proteinuria, unspecified: Secondary | ICD-10-CM | POA: Diagnosis not present

## 2023-10-25 DIAGNOSIS — E0842 Diabetes mellitus due to underlying condition with diabetic polyneuropathy: Secondary | ICD-10-CM | POA: Diagnosis not present

## 2023-10-30 DIAGNOSIS — Z79899 Other long term (current) drug therapy: Secondary | ICD-10-CM | POA: Diagnosis not present

## 2023-11-06 ENCOUNTER — Other Ambulatory Visit: Payer: Self-pay | Admitting: *Deleted

## 2023-11-06 DIAGNOSIS — I70229 Atherosclerosis of native arteries of extremities with rest pain, unspecified extremity: Secondary | ICD-10-CM

## 2023-11-06 DIAGNOSIS — I739 Peripheral vascular disease, unspecified: Secondary | ICD-10-CM

## 2023-11-13 ENCOUNTER — Ambulatory Visit (INDEPENDENT_AMBULATORY_CARE_PROVIDER_SITE_OTHER)
Admission: RE | Admit: 2023-11-13 | Discharge: 2023-11-13 | Disposition: A | Discharge status: Home / Self Care | Payer: 59 | Source: Ambulatory Visit | Attending: Physician Assistant | Admitting: Physician Assistant

## 2023-11-13 ENCOUNTER — Ambulatory Visit (HOSPITAL_COMMUNITY)
Admission: RE | Admit: 2023-11-13 | Discharge: 2023-11-13 | Disposition: A | Discharge status: Home / Self Care | Payer: 59 | Source: Ambulatory Visit | Attending: Vascular Surgery | Admitting: Vascular Surgery

## 2023-11-13 ENCOUNTER — Ambulatory Visit (INDEPENDENT_AMBULATORY_CARE_PROVIDER_SITE_OTHER): Payer: 59 | Admitting: Physician Assistant

## 2023-11-13 ENCOUNTER — Other Ambulatory Visit (HOSPITAL_COMMUNITY): Payer: Self-pay | Admitting: Physician Assistant

## 2023-11-13 VITALS — BP 171/70 | HR 60 | Temp 97.8°F | Resp 20 | Ht 72.0 in | Wt 212.8 lb

## 2023-11-13 DIAGNOSIS — R609 Edema, unspecified: Secondary | ICD-10-CM

## 2023-11-13 DIAGNOSIS — I70229 Atherosclerosis of native arteries of extremities with rest pain, unspecified extremity: Secondary | ICD-10-CM | POA: Insufficient documentation

## 2023-11-13 DIAGNOSIS — I739 Peripheral vascular disease, unspecified: Secondary | ICD-10-CM

## 2023-11-13 DIAGNOSIS — M7989 Other specified soft tissue disorders: Secondary | ICD-10-CM | POA: Diagnosis not present

## 2023-11-13 LAB — VAS US ABI WITH/WO TBI
Left ABI: 1.23
Right ABI: 1.09

## 2023-11-13 NOTE — Progress Notes (Signed)
HISTORY AND PHYSICAL     CC:  follow up. Requesting Provider:  Marcine Matar, MD  HPI: This is a 67 y.o. female who is here today for follow up for PAD.  Pt has hx of  left lower extremity arteriogram with balloon angioplasty of the anterior tibial artery by Dr. Chestine Spore on 04/06/2022. Following that she underwent an office debridement of a left fifth toe ulceration with osteomyelitis by podiatry, Dr. Lilian Kapur.  She has hx of left great ot amputation on 10/26/2017 by Dr. Logan Bores.   Pt was last seen 11/07/2022 and at that time, she did not have any new wounds and her main complaint was leg swelling and she was in compression.  She had venous duplex 08/15/2022 and it revealed minimal venous reflux.  The pt returns today for follow up.  She states she has continued swelling in her legs.  She states she fell last week and since then, the pain and swelling has been worse in the right leg.  She has a bed that elevates her legs and she has been doing this.  She is now with pain management.  She states that she has not looked at the great toe amputation site since it happened 2 years ago.  She cleans it in the dark so she doesn't have to look at it.  She states she is on gabapentin for the toe as she can still feel it even though it is gone.   The pt is on a statin for cholesterol management.    The pt is not on an aspirin.    Other AC:  Plavix The pt is on BB, hydralazine, ARB, CCB, diuretic for hypertension.  The pt is on medication for diabetes. Tobacco hx:  never    Past Medical History:  Diagnosis Date   Anemia    requiring transfusion   Boil of buttock ~ 2016   Depression    Diabetic neuropathy (HCC)    [name] 08/23/2017   DKA (diabetic ketoacidoses)    recent/notes 01/06/2015   Exertional dyspnea 08/15/2023   Hyperlipidemia    Hypertension    Murmur 10/19/2022   Neuropathy    Peripheral vascular disease (HCC)    Toe ulcer (HCC)    left great toe/notes 08/23/2017   Type II diabetes  mellitus (HCC)     Past Surgical History:  Procedure Laterality Date   ABDOMINAL AORTOGRAM W/LOWER EXTREMITY N/A 04/06/2022   Procedure: ABDOMINAL AORTOGRAM W/LOWER EXTREMITY;  Surgeon: Cephus Shelling, MD;  Location: [facility];  Service: Cardiovascular;  Laterality: N/A;   AMPUTATION TOE Left 10/26/2017   Procedure: AMPUTATION PARTIAL RAY LEFT FOOT AND IRRIGATION/DEBRIDEMENT;  Surgeon: Felecia Shelling, DPM;  Location: [facility] OR;  Service: Podiatry;  Laterality: Left;  AMPUTATION PARTIAL RAY LEFT FOOT AND IRRIGATION/DEBRIDEMENT   CHOLECYSTECTOMY  01/24/2003   [name] 04/04/2011   COLONOSCOPY     INCISION AND DRAINAGE ABSCESS  03/17/2012   "right but; left lower abdoment"/notes 03/17/2012   LOWER EXTREMITY INTERVENTION N/A 09/10/2017   Procedure: LOWER EXTREMITY INTERVENTION;  Surgeon: Runell Gess, MD;  Location: [facility];  Service: Cardiovascular;  Laterality: N/A;   PERIPHERAL VASCULAR ATHERECTOMY  09/10/2017   Procedure: PERIPHERAL VASCULAR ATHERECTOMY;  Surgeon: Runell Gess, MD;  Location: [facility];  Service: Cardiovascular;;  left AT   PERIPHERAL VASCULAR BALLOON ANGIOPLASTY Left 04/06/2022   Procedure: PERIPHERAL VASCULAR BALLOON ANGIOPLASTY;  Surgeon: Cephus Shelling, MD;  Location: [facility];  Service: Cardiovascular;  Laterality: Left;    Allergies  Allergen Reactions   Other Swelling    Seaweed= swelling on arms, hands and face   Metformin And Related Nausea Only    Current Outpatient Medications  Medication Sig Dispense Refill   atorvastatin (LIPITOR) 80 MG tablet Take 1 tablet (80 mg total) by mouth daily at 6 PM. 90 tablet 2   BAQSIMI ONE PACK 3 MG/DOSE POWD Place 1 spray into the nose as needed (Low blood sugar).     Blood Pressure KIT Check blood pressure twice a day Dx: I10, hypertension 1 kit 0   Carboxymethylcellulose Sodium (EYE DROPS OP) Place 1 drop into both eyes daily.     carvedilol (COREG) 6.25 MG tablet Take  1 tablet (6.25 mg total) by mouth 2 (two) times daily with a meal. 180 tablet 2   clopidogrel (PLAVIX) 75 MG tablet TAKE 1 TABLET BY MOUTH EVERY DAY WITH BREAKFAST (Patient taking differently: Take 75 mg by mouth at bedtime.) 90 tablet 3   Continuous Blood Gluc Receiver (FREESTYLE LIBRE 2 READER) DEVI Use as directed 3 times daily 1 each 12   Continuous Blood Gluc Receiver (FREESTYLE LIBRE 2 READER) DEVI Scan as needed for continuous glucose monitoring. 1 each 0   Continuous Blood Gluc Sensor (FREESTYLE LIBRE 2 SENSOR) MISC Change sensor every 2 weeks. 2 each 12   Continuous Blood Gluc Sensor (FREESTYLE LIBRE 2 SENSOR) MISC Inject 1 sensor to the skin every 14 days for continuous glucose monitoring. 2 each 11   gabapentin (NEURONTIN) 600 MG tablet Take 1 tablet (600 mg total) by mouth 2 (two) times daily. (Patient taking differently: Take 650 mg by mouth 2 (two) times daily.) 60 tablet 6   glucose blood (ONETOUCH VERIO) test strip 1 each by Misc.(Non-Drug; Combo Route) route 4 times daily before meals and nightly. 400 each 0   hydrALAZINE (APRESOLINE) 100 MG tablet Take 1 tablet (100 mg total) by mouth 3 (three) times daily. 270 tablet 2   insulin aspart (NOVOLOG) 100 UNIT/ML FlexPen Inject 10-17 units into the skin 3 times daily before meals. (Patient taking differently: Inject 10 Units into the skin 3 (three) times daily with meals.) 45 mL 3   Lancets (ONETOUCH DELICA PLUS LANCET33G) MISC Use 4 times daily before meals and nightly. 400 each 3   Lancets MISC Please dispense the Single/disposable Lancets.  Check BS 4x/day 100 each 3   Multiple Vitamins-Minerals (MULTIVITAMIN ADULT) CHEW Chew 2 tablets by mouth daily.     mupirocin cream (BACTROBAN) 2 % Apply topically daily. Apply to left foot wound and cover with dry dressing once daily (Patient taking differently: Apply 1 Application topically as needed. Apply to left foot wound and cover with dry dressing once daily) 30 g 0    Olmesartan-amLODIPine-HCTZ 40-10-25 MG TABS Take 1 tablet by mouth daily. 90 tablet 1   RESTASIS 0.05 % ophthalmic emulsion Place 1 drop into both eyes 2 (two) times daily.     Semaglutide, 1 MG/DOSE, (OZEMPIC, 1 MG/DOSE,) 4 MG/3ML SOPN Inject 1 mg into the skin once a week.     TOUJEO SOLOSTAR 300 UNIT/ML Solostar Pen Inject 70 Units into the skin daily.     traMADol (ULTRAM) 50 MG tablet Take 1 tablet (50 mg total) by mouth every 12 (twelve) hours as needed. (Patient not taking: Reported on 09/24/2023) 14 tablet 1   TRUEPLUS PEN NEEDLES 31G X 5 MM MISC Use to inject Toujeo once daily. 100 each 2  VITAMIN D PO Take 2 tablets by mouth at bedtime.     No current facility-administered medications for this visit.    Family History  Problem Relation Age of Onset   Kidney disease Mother    Hypertension Mother    Diabetes Father    Hypertension Father    Hypertension Paternal Aunt    Diabetes Paternal Aunt    Hypertension Paternal Uncle    Diabetes Paternal Uncle    Cancer Maternal Grandmother    Hypertension Maternal Grandfather    Diabetes Paternal Grandfather    Colon cancer Neg Hx    Breast cancer Neg Hx     Social History   Socioeconomic History   Marital status: Married    Spouse name: Not on file   Number of children: Not on file   Years of education: Not on file   Highest education level: Not on file  Occupational History   Not on file  Tobacco Use   Smoking status: Never    Passive exposure: Never   Smokeless tobacco: Never  Vaping Use   Vaping status: Never Used  Substance and Sexual Activity   Alcohol use: No   Drug use: No   Sexual activity: Never  Other Topics Concern   Not on file  Social History Narrative   Not on file   Social Drivers of Health   Financial Resource Strain: Low Risk  (10/05/2022)   Overall Financial Resource Strain (CARDIA)    Difficulty of Paying Living Expenses: Not hard at all  Food Insecurity: No Food Insecurity (09/24/2023)    Hunger Vital Sign    Worried About Running Out of Food in the Last Year: Never true    Ran Out of Food in the Last Year: Never true  Transportation Needs: No Transportation Needs (10/05/2022)   PRAPARE - Administrator, Civil Service (Medical): No    Lack of Transportation (Non-Medical): No  Physical Activity: Inactive (10/05/2022)   Exercise Vital Sign    Days of Exercise per Week: 0 days    Minutes of Exercise per Session: 0 min  Stress: No Stress Concern Present (10/05/2022)   [employer] of Occupational Health - Occupational Stress Questionnaire    Feeling of Stress : Only a little  Social Connections: Socially Integrated (10/05/2022)   Social Connection and Isolation Panel [NHANES]    Frequency of Communication with Friends and Family: More than three times a week    Frequency of Social Gatherings with Friends and Family: Once a week    Attends Religious Services: More than 4 times per year    Active Member of [employer] or Organizations: Yes    Attends Engineer, structural: More than 4 times per year    Marital Status: Married  Catering manager Violence: Not At Risk (10/05/2022)   Humiliation, Afraid, Rape, and Kick questionnaire    Fear of Current or Ex-Partner: No    Emotionally Abused: No    Physically Abused: No    Sexually Abused: No     REVIEW OF SYSTEMS:   [X]  denotes positive finding, [ ]  denotes negative finding Cardiac  Comments:  Chest pain or chest pressure:    Shortness of breath upon exertion:    Short of breath when lying flat:    Irregular heart rhythm:        Vascular    Pain in calf, thigh, or hip brought on by ambulation:    Pain in feet at night that wakes  you up from your sleep:     Blood clot in your veins:    Leg swelling:  x       Pulmonary    Oxygen at home:    Productive cough:     Wheezing:         Neurologic    Sudden weakness in arms or legs:     Sudden numbness in arms or legs:     Sudden onset of  difficulty speaking or slurred speech:    Temporary loss of vision in one eye:     Problems with dizziness:         Gastrointestinal    Blood in stool:     Vomited blood:         Genitourinary    Burning when urinating:     Blood in urine:        Psychiatric    Major depression:         Hematologic    Bleeding problems:    Problems with blood clotting too easily:        Skin    Rashes or ulcers:        Constitutional    Fever or chills:      PHYSICAL EXAMINATION:  Today's Vitals   11/13/23 0917  BP: (!) 171/70  Pulse: 60  Resp: 20  Temp: 97.8 F (36.6 C)  TempSrc: Temporal  SpO2: 100%  Weight: 212 lb 12.8 oz (96.5 kg)  Height: 6' (1.829 m)  PainSc: 8    Body mass index is 28.86 kg/m.   General:  WDWN in NAD; vital signs documented above Gait: Not observed HENT: WNL, normocephalic Pulmonary: normal non-labored breathing , without wheezing Cardiac: regular HR, with carotid bruit on the left Skin: without rashes Vascular Exam/Pulses:  Right Left  Radial 2+ (normal) 2+ (normal)  DP Brisk biphasic 2+ (normal) & multiphasic  PT absent absent  Peroneal Brisk biphasic Brisk biphasic   Extremities: without ischemic changes, without Gangrene , without cellulitis; without open wounds; + BLE swelling with hemosiderin staining present; well healed left great toe amp site.   Musculoskeletal: no muscle wasting or atrophy  Neurologic: A&O X 3 Psychiatric:  The pt has Normal affect.   Non-Invasive Vascular Imaging:   ABI's/TBI's on 11/13/2023: Right:  1.09/0.48 - Great toe pressure: 94 Left:  1.23/amp - Great toe pressure: amp   Previous ABI's/TBI's on 11/07/2022: Right:  1.10/0.62 - Great toe pressure: 117 Left:  1.01 - Great toe pressure:  0     ASSESSMENT/PLAN:: 67 y.o. female here for follow up for PAD with hx of left lower extremity arteriogram with balloon angioplasty of the anterior tibial artery by Dr. Chestine Spore on 04/06/2022. Following that she  underwent an office debridement of a left fifth toe ulceration with osteomyelitis by podiatry, Dr. Lilian Kapur.  She has hx of left great ot amputation on 10/26/2017 by Dr. Logan Bores.    -pt with palpable left DP pulse.  Doppler signals are multiphasic bilateral DP/pero.  -she does have some pain and swelling in the right leg after fall last week.  Will get DVT study today.  This was negative.  She will be measured for 15-20 mmHg knee high compression stockings today.  Discussed with her to put them on in the morning before getting out of bed and take them off at night.  She will continue to elevate her legs.  -left carotid bruit-will get carotid duplex in the next few weeks.  -continue statin/plavix -  f/u one year with ABI and LLE arterial duplex on Dr. Chestine Spore clinic day.   Doreatha Massed, [facility] [phone]  Clinic MD:   Lenell Antu

## 2023-11-27 ENCOUNTER — Other Ambulatory Visit: Payer: Self-pay

## 2023-11-27 DIAGNOSIS — R0989 Other specified symptoms and signs involving the circulatory and respiratory systems: Secondary | ICD-10-CM

## 2023-12-07 ENCOUNTER — Other Ambulatory Visit: Payer: Self-pay | Admitting: Internal Medicine

## 2023-12-07 DIAGNOSIS — I152 Hypertension secondary to endocrine disorders: Secondary | ICD-10-CM

## 2023-12-10 DIAGNOSIS — M7989 Other specified soft tissue disorders: Secondary | ICD-10-CM

## 2023-12-18 ENCOUNTER — Ambulatory Visit: Payer: 59 | Attending: Internal Medicine | Admitting: Internal Medicine

## 2023-12-18 ENCOUNTER — Encounter: Payer: Self-pay | Admitting: Internal Medicine

## 2023-12-18 VITALS — BP 194/84 | HR 70 | Temp 98.5°F | Wt 218.0 lb

## 2023-12-18 DIAGNOSIS — E1159 Type 2 diabetes mellitus with other circulatory complications: Secondary | ICD-10-CM | POA: Diagnosis not present

## 2023-12-18 DIAGNOSIS — Z7985 Long-term (current) use of injectable non-insulin antidiabetic drugs: Secondary | ICD-10-CM

## 2023-12-18 DIAGNOSIS — I739 Peripheral vascular disease, unspecified: Secondary | ICD-10-CM

## 2023-12-18 DIAGNOSIS — Z794 Long term (current) use of insulin: Secondary | ICD-10-CM

## 2023-12-18 DIAGNOSIS — E1142 Type 2 diabetes mellitus with diabetic polyneuropathy: Secondary | ICD-10-CM | POA: Diagnosis not present

## 2023-12-18 DIAGNOSIS — R269 Unspecified abnormalities of gait and mobility: Secondary | ICD-10-CM

## 2023-12-18 DIAGNOSIS — I152 Hypertension secondary to endocrine disorders: Secondary | ICD-10-CM

## 2023-12-18 DIAGNOSIS — Z9181 History of falling: Secondary | ICD-10-CM

## 2023-12-18 DIAGNOSIS — R748 Abnormal levels of other serum enzymes: Secondary | ICD-10-CM

## 2023-12-18 LAB — POCT GLYCOSYLATED HEMOGLOBIN (HGB A1C): HbA1c, POC (controlled diabetic range): 10.7 % — AB (ref 0.0–7.0)

## 2023-12-18 MED ORDER — HYDRALAZINE HCL 25 MG PO TABS: 25.0000 mg | ORAL_TABLET | Freq: Three times a day (TID) | ORAL | 3 refills | Status: DC

## 2023-12-18 NOTE — Progress Notes (Signed)
Patient ID: Katherine [name], female    DOB: [birth year]  MRN: [id number]  CC: Hypertension and Diabetes   Subjective: Katherine [name] is a 68 y.o. female who presents for chronic ds management. Katherine concerns today include:  Hx of HTN, DM with neuropathy, retinopathy and possible gastroparesis, HL, depression, GERD, PAD ( left anterior tibial angioplasty 03/2022 for left fifth toe osteomyelitis), s/p amputation LT 1st toe, BL RAS 59%.    DM/Obesity: Results for orders placed or performed in visit on 12/18/23  HgB A1c   Collection Time: 12/18/23  9:06 AM  Result Value Ref Range   Hemoglobin A1C     HbA1c POC (<> result, manual entry)     HbA1c, POC (prediabetic range)     HbA1c, POC (controlled diabetic range) 10.7 (A) 0.0 - 7.0 %   Patient should be on Toujeo 80 units daily, NovoLog 25 units with meals.  There was some confusion on last visit about the dose of Ozempic that she was taking as a Advice worker at [facility] pain clinic tried to take over management of Katherine diabetes.  She was seeing endocrinology at [facility] but had 2-3 appointments canceled and a row by their clinic so we referred Katherine to [place].  Has appt with [facility] endocrinology on 01/08/2024.  Tells me she is on Ozempic 0.5 mg Q wk but has not taken in past 2 wks because she has not been eating much since she had a fall 3 wks ago. Fell 1st wk of January.  Was pushing Katherine husband in his WC down the ram outside Katherine house, slipped on black ice.  She has had increase pain and sensitivity in legs from knees to toes since then; painful to stand. Could not get up to cook so was not eating regularly so she has not taken Ozempic for past 2 wks.  Followed by [facility]; changed from Tramadol to [facility] 9 mg TID.  She has been taking it 3-5 times  day. Helps with pain in legs.  Wants referral to a "neuropathy doctor" for Katherine DM neuropathy -Has had issues with CGM staying in place.  Not checking BS. -has appt  01/08/2024 with an endocrinologist [place] -Has not seen nutritionist since last visit.  HTN: Should be on carvedilol 6.25 mg twice a day, olmesartan-amlodipine-HCTZ 40/10/25 mg daily and hydralazine 100 mg 3 times daily.  Reports taking all except Hydralazine; still thinks this was d/c by Dr. Duke Salvia last yr but it was not.  HL/PAD: Reports compliance with atorvastatin 80 mg daily, Plavix 75 mg daily and aspirin. -Saw vascular last month.  Fitted for compression socks. Does not wear consistently.  LDL 1 year ago was 44  HM: Due for MWV. Patient Active Problem List   Diagnosis Date Noted   Exertional dyspnea 08/15/2023   Mild major depression (HCC) 11/24/2022   Murmur 10/19/2022   Medication monitoring encounter 04/10/2022   Cellulitis of left lower extremity    Neuropathy 12/08/2021   Diabetic gastroparesis associated with type 2 diabetes mellitus (HCC) 07/30/2021   Type 2 diabetes mellitus with diabetic peripheral angiopathy without gangrene, with long-term current use of insulin (HCC) 07/30/2021   Amputation of left great toe (HCC) 06/13/2021   Microalbuminuria due to type 2 diabetes mellitus (HCC) 05/04/2021   Diabetes (HCC) 12/22/2019   Renal artery stenosis (HCC) 12/01/2019   Diabetic polyneuropathy associated with type 2 diabetes mellitus (HCC) 05/14/2018   Thyroid nodule 10/04/2017   Microcytic anemia 09/11/2017  Retinopathy 08/22/2017   Thyroid enlargement 07/12/2017   Dyslipidemia 09/20/2016   DEPRESSION 09/10/2009   LEG CRAMPS 05/19/2009   GERD 02/15/2009   Gastroparesis 09/03/2008   Dental caries 07/14/2008   Essential hypertension 09/16/2003     Current Outpatient Medications on File Prior to Visit  Medication Sig Dispense Refill   atorvastatin (LIPITOR) 80 MG tablet TAKE 1 TABLET BY MOUTH DAILY AT 6 PM. 90 tablet 0   BAQSIMI ONE PACK 3 MG/DOSE POWD Place 1 spray into the nose as needed (Low blood sugar).     Blood Pressure KIT Check blood pressure twice a day  Dx: I10, hypertension 1 kit 0   carvedilol (COREG) 6.25 MG tablet TAKE 1 TABLET BY MOUTH 2 TIMES DAILY WITH A MEAL. 180 tablet 0   clopidogrel (PLAVIX) 75 MG tablet TAKE 1 TABLET BY MOUTH EVERY DAY WITH BREAKFAST (Patient taking differently: Take 75 mg by mouth at bedtime.) 90 tablet 3   Continuous Blood Gluc Receiver (FREESTYLE LIBRE 2 READER) DEVI Use as directed 3 times daily 1 each 12   Continuous Blood Gluc Receiver (FREESTYLE LIBRE 2 READER) DEVI Scan as needed for continuous glucose monitoring. 1 each 0   Continuous Blood Gluc Sensor (FREESTYLE LIBRE 2 SENSOR) MISC Change sensor every 2 weeks. 2 each 12   Continuous Blood Gluc Sensor (FREESTYLE LIBRE 2 SENSOR) MISC Inject 1 sensor to the skin every 14 days for continuous glucose monitoring. 2 each 11   gabapentin (NEURONTIN) 600 MG tablet Take 1 tablet (600 mg total) by mouth 2 (two) times daily. (Patient taking differently: Take 650 mg by mouth 2 (two) times daily.) 60 tablet 6   glucose blood (ONETOUCH VERIO) test strip 1 each by Misc.(Non-Drug; Combo Route) route 4 times daily before meals and nightly. 400 each 0   insulin aspart (NOVOLOG) 100 UNIT/ML FlexPen Inject 10-17 units into the skin 3 times daily before meals. (Patient taking differently: Inject 10 Units into the skin 3 (three) times daily with meals.) 45 mL 3   Lancets (ONETOUCH DELICA PLUS LANCET33G) MISC Use 4 times daily before meals and nightly. 400 each 3   Lancets MISC Please dispense the Single/disposable Lancets.  Check BS 4x/day 100 each 3   Multiple Vitamins-Minerals (MULTIVITAMIN ADULT) CHEW Chew 2 tablets by mouth daily.     Olmesartan-amLODIPine-HCTZ 40-10-25 MG TABS Take 1 tablet by mouth daily. 90 tablet 1   RESTASIS 0.05 % ophthalmic emulsion Place 1 drop into both eyes 2 (two) times daily.     Semaglutide, 1 MG/DOSE, (OZEMPIC, 1 MG/DOSE,) 4 MG/3ML SOPN Inject 1 mg into the skin once a week.     TOUJEO SOLOSTAR 300 UNIT/ML Solostar Pen Inject 70 Units into the skin  daily.     TRUEPLUS PEN NEEDLES 31G X 5 MM MISC Use to inject Toujeo once daily. 100 each 2   VITAMIN D PO Take 2 tablets by mouth at bedtime.     Carboxymethylcellulose Sodium (EYE DROPS OP) Place 1 drop into both eyes daily. (Patient not taking: Reported on 12/18/2023)     mupirocin cream (BACTROBAN) 2 % Apply topically daily. Apply to left foot wound and cover with dry dressing once daily (Patient not taking: Reported on 12/18/2023) 30 g 0   traMADol (ULTRAM) 50 MG tablet Take 1 tablet (50 mg total) by mouth every 12 (twelve) hours as needed. (Patient not taking: Reported on 12/18/2023) 14 tablet 1   No current facility-administered medications on file prior to visit.  Allergies  Allergen Reactions   Other Swelling    Seaweed= swelling on arms, hands and face   Metformin And Related Nausea Only    Social History   Socioeconomic History   Marital status: Married    Spouse name: Not on file   Number of children: Not on file   Years of education: Not on file   Highest education level: Not on file  Occupational History   Not on file  Tobacco Use   Smoking status: Never    Passive exposure: Never   Smokeless tobacco: Never  Vaping Use   Vaping status: Never Used  Substance and Sexual Activity   Alcohol use: No   Drug use: No   Sexual activity: Never  Other Topics Concern   Not on file  Social History Narrative   Not on file   Social Drivers of Health   Financial Resource Strain: Low Risk  (10/05/2022)   Overall Financial Resource Strain (CARDIA)    Difficulty of Paying Living Expenses: Not hard at all  Food Insecurity: No Food Insecurity (09/24/2023)   Hunger Vital Sign    Worried About Running Out of Food in the Last Year: Never true    Ran Out of Food in the Last Year: Never true  Transportation Needs: No Transportation Needs (10/05/2022)   PRAPARE - Administrator, Civil Service (Medical): No    Lack of Transportation (Non-Medical): No  Physical  Activity: Inactive (10/05/2022)   Exercise Vital Sign    Days of Exercise per Week: 0 days    Minutes of Exercise per Session: 0 Katherine  Stress: No Stress Concern Present (10/05/2022)   [employer] of Occupational Health - Occupational Stress Questionnaire    Feeling of Stress : Only a little  Social Connections: Socially Integrated (12/18/2023)   Social Connection and Isolation Panel [NHANES]    Frequency of Communication with Friends and Family: Twice a week    Frequency of Social Gatherings with Friends and Family: Twice a week    Attends Religious Services: More than 4 times per year    Active Member of [employer] or Organizations: Yes    Attends Banker Meetings: More than 4 times per year    Marital Status: Married  Catering manager Violence: Not At Risk (10/05/2022)   Humiliation, Afraid, Rape, and Kick questionnaire    Fear of Current or Ex-Partner: No    Emotionally Abused: No    Physically Abused: No    Sexually Abused: No    Family History  Problem Relation Age of Onset   Kidney disease Mother    Hypertension Mother    Diabetes Father    Hypertension Father    Hypertension Paternal Aunt    Diabetes Paternal Aunt    Hypertension Paternal Uncle    Diabetes Paternal Uncle    Cancer Maternal Grandmother    Hypertension Maternal Grandfather    Diabetes Paternal Grandfather    Colon cancer Neg Hx    Breast cancer Neg Hx     Past Surgical History:  Procedure Laterality Date   ABDOMINAL AORTOGRAM W/LOWER EXTREMITY N/A 04/06/2022   Procedure: ABDOMINAL AORTOGRAM W/LOWER EXTREMITY;  Surgeon: Cephus Shelling, MD;  Location: [facility] INVASIVE CV LAB;  Service: Cardiovascular;  Laterality: N/A;   AMPUTATION TOE Left 10/26/2017   Procedure: AMPUTATION PARTIAL RAY LEFT FOOT AND IRRIGATION/DEBRIDEMENT;  Surgeon: Felecia Shelling, DPM;  Location: [facility] OR;  Service: Podiatry;  Laterality: Left;  AMPUTATION PARTIAL RAY LEFT  FOOT AND IRRIGATION/DEBRIDEMENT   CHOLECYSTECTOMY   01/24/2003   [name] 04/04/2011   COLONOSCOPY     INCISION AND DRAINAGE ABSCESS  03/17/2012   "right but; left lower abdoment"/notes 03/17/2012   LOWER EXTREMITY INTERVENTION N/A 09/10/2017   Procedure: LOWER EXTREMITY INTERVENTION;  Surgeon: Runell Gess, MD;  Location: [facility] INVASIVE CV LAB;  Service: Cardiovascular;  Laterality: N/A;   PERIPHERAL VASCULAR ATHERECTOMY  09/10/2017   Procedure: PERIPHERAL VASCULAR ATHERECTOMY;  Surgeon: Runell Gess, MD;  Location: [facility] INVASIVE CV LAB;  Service: Cardiovascular;;  left AT   PERIPHERAL VASCULAR BALLOON ANGIOPLASTY Left 04/06/2022   Procedure: PERIPHERAL VASCULAR BALLOON ANGIOPLASTY;  Surgeon: Cephus Shelling, MD;  Location: [facility] INVASIVE CV LAB;  Service: Cardiovascular;  Laterality: Left;    ROS: Review of Systems Negative except as stated above  PHYSICAL EXAM: BP (!) 194/84 (BP Location: Right Arm, Patient Position: Sitting, Cuff Size: Large)   Pulse 70   Temp 98.5 F (36.9 C)   Wt 218 lb (98.9 kg)   SpO2 100%   BMI 29.57 kg/m   Physical Exam BP 198/84 General appearance - alert, well appearing, and in no distress Mental status - normal mood, behavior, speech, dress, motor activity, and thought processes Chest - clear to auscultation, no wheezes, rales or rhonchi, symmetric air entry Heart - normal rate, regular rhythm, normal S1, S2, no murmurs, rubs, clicks or gallops Musculoskeletal - knees: no edema or erythema. Good ROM but legs from knees down very sensitive to touch Extremities - gait is slow and careful with low foot to floor clearance      Latest Ref Rng & Units 06/18/2023    1:03 PM 11/24/2022   11:19 AM 04/24/2022   11:52 AM  CMP  Glucose 70 - 99 mg/dL 253  664  403   BUN 8 - 27 mg/dL 15  16  18    Creatinine 0.57 - 1.00 mg/dL 4.74  2.59  5.63   Sodium 134 - 144 mmol/L 139  140  142   Potassium 3.5 - 5.2 mmol/L 4.3  4.5  3.8   Chloride 96 - 106 mmol/L 97  99  102   CO2 20 - 29 mmol/L 31  26  33   Calcium 8.7  - 10.3 mg/dL 9.4  9.7  9.4   Total Protein 6.0 - 8.5 g/dL  6.9  7.0   Total Bilirubin 0.0 - 1.2 mg/dL  0.5  0.6   Alkaline Phos 44 - 121 IU/L  144    AST 0 - 40 IU/L  15  10   ALT 0 - 32 IU/L  13  8    Lipid Panel     Component Value Date/Time   CHOL 118 11/24/2022 1119   TRIG 61 11/24/2022 1119   HDL 61 11/24/2022 1119   CHOLHDL 1.9 11/24/2022 1119   CHOLHDL 3.8 09/19/2016 1452   VLDL 36 (H) 09/19/2016 1452   LDLCALC 44 11/24/2022 1119    CBC    Component Value Date/Time   WBC 4.6 08/16/2023 1015   WBC 4.6 04/24/2022 1152   RBC 4.81 08/16/2023 1015   RBC 4.29 04/24/2022 1152   HGB 13.7 08/16/2023 1015   HCT 43.2 08/16/2023 1015   PLT 230 08/16/2023 1015   MCV 90 08/16/2023 1015   MCH 28.5 08/16/2023 1015   MCH 28.0 04/24/2022 1152   MCHC 31.7 08/16/2023 1015   MCHC 32.2 04/24/2022 1152   RDW 12.0 08/16/2023 1015   LYMPHSABS 1.9  08/16/2023 1015   MONOABS 0.7 04/01/2022 0132   EOSABS 0.2 08/16/2023 1015   BASOSABS 0.0 08/16/2023 1015    ASSESSMENT AND PLAN: 1. Type 2 diabetes mellitus with peripheral neuropathy (HCC) (Primary) A1c not at goal and has increased some from 3 months ago. She has not taken Ozempic in 2 weeks.  I recommend that she restart the Ozempic.  Continue Toujeo 80 mg daily and NovoLog 25 units TID -Advised that she checks blood sugars at least twice a day and have log with Katherine when she goes to see the endocrinologist next month with [facility] endocrinology. She is on gabapentin and sees pain management through [facility] for painful neuropathy.  I told Katherine I do not think neurology would have much more to add in regards to management of Katherine neuropathy but resubmitted the referral nonetheless. - Microalbumin / creatinine urine ratio - Lipid panel - Comprehensive metabolic panel - HgB A1c - Ambulatory referral to Neurology  2. Long-term (current) use of injectable non-insulin antidiabetic drugs 3. Insulin long-term use (HCC) See #1  above.  4. History of recent fall With increased pain associated with peripheral neuropathy.  I recommend referral to physical therapy to help get Katherine back moving again - Ambulatory referral to Physical Therapy  5. Gait disturbance See #4 above - Ambulatory referral to Physical Therapy  6. Hypertension associated with diabetes (HCC) Not at goal.  She will continue olmesartan/amlodipine/HCTZ combination pill and carvedilol 6.25 mg twice a day.  Restart hydralazine.  Since she has been off of it for several months, we will restart at a dose of 25 mg 3 times a day.  Will have Katherine follow-up with our RN in 2 weeks for recheck of blood pressure with instructions to increase to 50 mg 3 times a day if blood pressure still greater than 130/80. - hydrALAZINE (APRESOLINE) 25 MG tablet; Take 1 tablet (25 mg total) by mouth 3 (three) times daily.  Dispense: 90 tablet; Refill: 3 - Recheck vitals  7. PAD (peripheral artery disease) (HCC) Continue aspirin, Plavix and atorvastatin.  Addendum 12/19/2023: Patient with elevated alkaline phosphatase.  Will add GGT to labs.  Patient was given the opportunity to ask questions.  Patient verbalized understanding of the plan and was able to repeat key elements of the plan.   This documentation was completed using Paediatric nurse.  Any transcriptional errors are unintentional.  Orders Placed This Encounter  Procedures   Microalbumin / creatinine urine ratio   Lipid panel   Comprehensive metabolic panel   Ambulatory referral to Neurology   Ambulatory referral to Physical Therapy   Recheck vitals   HgB A1c     Requested Prescriptions   Signed Prescriptions Disp Refills   hydrALAZINE (APRESOLINE) 25 MG tablet 90 tablet 3    Sig: Take 1 tablet (25 mg total) by mouth 3 (three) times daily.    Return in about 4 months (around 04/16/2024) for Medicare Wellness Visit in   2 wks with CMA. RN visit in 2 wks for BP recheck.  Jonah Blue,  MD, FACP

## 2023-12-18 NOTE — Patient Instructions (Addendum)
Your blood pressure is not at goal.  We have restarted hydralazine 25 mg 3 times a day.  Please restart your Ozempic 0.5 mg once a week.  Keep upcoming appointment with endocrinology next month.  Please keep a log of blood sugars and take them with you to that visit.  We have submitted a referral for you to do some physical therapy to help get you back on your feet.   Please use your compression socks that was prescribed by your vascular specialist.

## 2023-12-18 NOTE — Progress Notes (Signed)
F/u DM HTN- elevated, denies HA, dizziness, or blurred vision States she has not taken hydralazine in some time. She does not have the prescription

## 2023-12-19 NOTE — Addendum Note (Signed)
Addended by: Jonah Blue B on: 12/19/2023 08:54 AM   Modules accepted: Orders

## 2023-12-20 ENCOUNTER — Other Ambulatory Visit: Payer: Self-pay

## 2023-12-20 DIAGNOSIS — Z794 Long term (current) use of insulin: Secondary | ICD-10-CM

## 2023-12-20 LAB — LIPID PANEL
Chol/HDL Ratio: 2.4 {ratio} (ref 0.0–4.4)
Cholesterol, Total: 140 mg/dL (ref 100–199)
HDL: 58 mg/dL (ref >39)
LDL Chol Calc (NIH): 64 mg/dL (ref 0–99)
Triglycerides: 96 mg/dL (ref 0–149)
VLDL Cholesterol Cal: 18 mg/dL (ref 5–40)

## 2023-12-20 LAB — COMPREHENSIVE METABOLIC PANEL
ALT: 13 [IU]/L (ref 0–32)
AST: 13 [IU]/L (ref 0–40)
Albumin: 4.1 g/dL (ref 3.9–4.9)
Alkaline Phosphatase: 164 [IU]/L — ABNORMAL HIGH (ref 44–121)
BUN/Creatinine Ratio: 18 (ref 12–28)
BUN: 15 mg/dL (ref 8–27)
Bilirubin Total: 0.4 mg/dL (ref 0.0–1.2)
CO2: 30 mmol/L — ABNORMAL HIGH (ref 20–29)
Calcium: 9.3 mg/dL (ref 8.7–10.3)
Chloride: 101 mmol/L (ref 96–106)
Creatinine, Ser: 0.83 mg/dL (ref 0.57–1.00)
Globulin, Total: 2.9 g/dL (ref 1.5–4.5)
Glucose: 198 mg/dL — ABNORMAL HIGH (ref 70–99)
Potassium: 4.1 mmol/L (ref 3.5–5.2)
Sodium: 143 mmol/L (ref 134–144)
Total Protein: 7 g/dL (ref 6.0–8.5)
eGFR: 77 mL/min/{1.73_m2} (ref >59)

## 2023-12-20 LAB — MICROALBUMIN / CREATININE URINE RATIO
Creatinine, Urine: 70.4 mg/dL
Microalb/Creat Ratio: 82 mg/g{creat} — ABNORMAL HIGH (ref 0–29)
Microalbumin, Urine: 57.9 ug/mL

## 2023-12-21 LAB — GAMMA GT: GGT: 15 [IU]/L (ref 0–60)

## 2023-12-21 LAB — SPECIMEN STATUS REPORT

## 2023-12-25 ENCOUNTER — Ambulatory Visit (HOSPITAL_COMMUNITY)
Admission: RE | Admit: 2023-12-25 | Discharge: 2023-12-25 | Disposition: A | Discharge status: Home / Self Care | Payer: 59 | Source: Ambulatory Visit | Attending: Vascular Surgery | Admitting: Vascular Surgery

## 2023-12-25 ENCOUNTER — Ambulatory Visit (INDEPENDENT_AMBULATORY_CARE_PROVIDER_SITE_OTHER): Payer: 59 | Admitting: Physician Assistant

## 2023-12-25 VITALS — BP 160/81 | HR 67 | Temp 98.0°F | Ht 72.0 in | Wt 218.8 lb

## 2023-12-25 DIAGNOSIS — I6523 Occlusion and stenosis of bilateral carotid arteries: Secondary | ICD-10-CM | POA: Diagnosis not present

## 2023-12-25 DIAGNOSIS — R0989 Other specified symptoms and signs involving the circulatory and respiratory systems: Secondary | ICD-10-CM | POA: Insufficient documentation

## 2023-12-25 DIAGNOSIS — I739 Peripheral vascular disease, unspecified: Secondary | ICD-10-CM | POA: Diagnosis not present

## 2023-12-25 NOTE — Progress Notes (Signed)
 Office Note   History of Present Illness   Katherine [name] is a 68 y.o. (1955/12/06) female who presents for carotid duplex.  She has a history of left lower extremity angiogram with balloon angioplasty of the anterior tibial artery by Dr. Gretta on 04/06/2022.  She subsequently underwent an office debridement of a left fifth toe ulceration by Dr. Silva.  She does have a remote history of left great toe amputation on 10/26/2017 by podiatry.   At her last follow-up with our office she was doing okay.  She was having continued issues with swelling in her legs.  She continues to wear compression stockings and elevate her legs as needed.  She was found to have a left carotid bruit on exam and was brought back for baseline carotid studies.  At follow-up today she is doing well.  She denies any lower extremity rest pain or tissue loss.  She denies any symptoms of stroke such as slurred speech, sudden visual changes, sudden weakness/numbness, or facial droop.  She takes a daily statin and Plavix . Current Outpatient Medications  Medication Sig Dispense Refill   atorvastatin  (LIPITOR ) 80 MG tablet TAKE 1 TABLET BY MOUTH DAILY AT 6 PM. 90 tablet 0   BAQSIMI ONE PACK 3 MG/DOSE POWD Place 1 spray into the nose as needed (Low blood sugar).     Blood Pressure KIT Check blood pressure twice a day Dx: I10, hypertension 1 kit 0   Carboxymethylcellulose Sodium (EYE DROPS OP) Place 1 drop into both eyes daily.     carvedilol  (COREG ) 6.25 MG tablet TAKE 1 TABLET BY MOUTH 2 TIMES DAILY WITH A MEAL. 180 tablet 0   clopidogrel  (PLAVIX ) 75 MG tablet TAKE 1 TABLET BY MOUTH EVERY DAY WITH BREAKFAST (Patient taking differently: Take 75 mg by mouth at bedtime.) 90 tablet 3   Continuous Blood Gluc Receiver (FREESTYLE LIBRE 2 READER) DEVI Use as directed 3 times daily 1 each 12   Continuous Blood Gluc Receiver (FREESTYLE LIBRE 2 READER) DEVI Scan as needed for continuous glucose monitoring. 1 each 0   Continuous Blood  Gluc Sensor (FREESTYLE LIBRE 2 SENSOR) MISC Change sensor every 2 weeks. 2 each 12   Continuous Blood Gluc Sensor (FREESTYLE LIBRE 2 SENSOR) MISC Inject 1 sensor to the skin every 14 days for continuous glucose monitoring. 2 each 11   gabapentin  (NEURONTIN ) 600 MG tablet Take 1 tablet (600 mg total) by mouth 2 (two) times daily. (Patient taking differently: Take 650 mg by mouth 2 (two) times daily.) 60 tablet 6   glucose blood (ONETOUCH VERIO) test strip 1 each by Misc.(Non-Drug; Combo Route) route 4 times daily before meals and nightly. 400 each 0   hydrALAZINE  (APRESOLINE ) 25 MG tablet Take 1 tablet (25 mg total) by mouth 3 (three) times daily. 90 tablet 3   insulin  aspart (NOVOLOG ) 100 UNIT/ML FlexPen Inject 10-17 units into the skin 3 times daily before meals. (Patient taking differently: Inject 10 Units into the skin 3 (three) times daily with meals.) 45 mL 3   Lancets (ONETOUCH DELICA PLUS LANCET33G) MISC Use 4 times daily before meals and nightly. 400 each 3   Lancets MISC Please dispense the Single/disposable Lancets.  Check BS 4x/day 100 each 3   Multiple Vitamins-Minerals (MULTIVITAMIN ADULT) CHEW Chew 2 tablets by mouth daily.     mupirocin  cream (BACTROBAN ) 2 % Apply topically daily. Apply to left foot wound and cover with dry dressing once daily 30 g 0   Olmesartan -amLODIPine -HCTZ 40-10-25  MG TABS Take 1 tablet by mouth daily. 90 tablet 1   RESTASIS 0.05 % ophthalmic emulsion Place 1 drop into both eyes 2 (two) times daily.     Semaglutide, 1 MG/DOSE, (OZEMPIC, 1 MG/DOSE,) 4 MG/3ML SOPN Inject 1 mg into the skin once a week.     TOUJEO  SOLOSTAR 300 UNIT/ML Solostar Pen Inject 70 Units into the skin daily.     traMADol  (ULTRAM ) 50 MG tablet Take 1 tablet (50 mg total) by mouth every 12 (twelve) hours as needed. 14 tablet 1   TRUEPLUS PEN NEEDLES 31G X 5 MM MISC Use to inject Toujeo  once daily. 100 each 2   VITAMIN D  PO Take 2 tablets by mouth at bedtime.     No current  facility-administered medications for this visit.    REVIEW OF SYSTEMS (negative unless checked):   Cardiac:  []  Chest pain or chest pressure? []  Shortness of breath upon activity? []  Shortness of breath when lying flat? []  Irregular heart rhythm?  Vascular:  []  Pain in calf, thigh, or hip brought on by walking? []  Pain in feet at night that wakes you up from your sleep? []  Blood clot in your veins? [x]  Leg swelling?  Pulmonary:  []  Oxygen at home? []  Productive cough? []  Wheezing?  Neurologic:  []  Sudden weakness in arms or legs? []  Sudden numbness in arms or legs? []  Sudden onset of difficult speaking or slurred speech? []  Temporary loss of vision in one eye? []  Problems with dizziness?  Gastrointestinal:  []  Blood in stool? []  Vomited blood?  Genitourinary:  []  Burning when urinating? []  Blood in urine?  Psychiatric:  []  Major depression  Hematologic:  []  Bleeding problems? []  Problems with blood clotting?  Dermatologic:  []  Rashes or ulcers?  Constitutional:  []  Fever or chills?  Ear/Nose/Throat:  []  Change in hearing? []  Nose bleeds? []  Sore throat?  Musculoskeletal:  []  Back pain? []  Joint pain? []  Muscle pain?   Physical Examination   Vitals:   12/25/23 0820  BP: (!) 160/81  Pulse: 67  Temp: 98 F (36.7 C)  TempSrc: Temporal  SpO2: 98%  Weight: 218 lb 12.8 oz (99.2 kg)  Height: 6' (1.829 m)   Body mass index is 29.67 kg/m.  General:  WDWN in NAD; vital signs documented above Gait: Not observed HENT: WNL, normocephalic Pulmonary: normal non-labored breathing , without rales, rhonchi,  wheezing Cardiac: regular with left carotid bruit Abdomen: soft, NT, no masses Skin: without rashes Vascular Exam/Pulses: palpable radial pulses bilaterally Extremities: without ischemic changes, without gangrene , without cellulitis; without open wounds;  Musculoskeletal: no muscle wasting or atrophy  Neurologic: A&O X 3;  No focal weakness or  paresthesias are detected Psychiatric:  The pt has Normal affect.  Non-Invasive Vascular Imaging   Bilateral Carotid Duplex (12/25/2023):  R ICA stenosis:  1-39% R VA:  patent and antegrade L ICA stenosis:  1-39% L VA:  patent and antegrade   Medical Decision Making   Nuala G Beeney is a 68 y.o. female who presents for carotid artery studies  Based on the patient's vascular studies, her carotid artery stenosis bilaterally is 1 to 39%. The patient has no prior history of CVA or TIA.  She denies any strokelike symptoms such as sudden visual changes, sudden weakness/numbness, facial droop, or slurred speech She does have a left carotid bruit on exam.  She has no tissue loss of her bilateral lower extremities She should continue her Plavix  and statin.  She can  follow-up with our office in 1 year with repeat ABIs and left lower extremity arterial studies.  Her carotids can be studied again in 3 years   Ahmed Holster PA-C [facility] Office: [phone]  Clinic MD: Magda

## 2023-12-26 ENCOUNTER — Telehealth (HOSPITAL_BASED_OUTPATIENT_CLINIC_OR_DEPARTMENT_OTHER): Payer: Self-pay | Admitting: *Deleted

## 2023-12-26 NOTE — Telephone Encounter (Signed)
 Patient was seen in September and sleep study ordered Sent mychart message to patient inquiring since code had not been given, was not read  Called patient, unable to leave message  Another mychart message sent   Patient needs follow up in ADV HTN and to do home sleep study.  If she does not want to do study needs to bring back or will be charged $100

## 2023-12-27 ENCOUNTER — Other Ambulatory Visit: Payer: 59

## 2023-12-27 ENCOUNTER — Telehealth: Payer: Self-pay

## 2023-12-27 NOTE — Telephone Encounter (Signed)
 VM box is full. Called to schedule a diabetic shoe pick up

## 2023-12-28 LAB — COMPREHENSIVE METABOLIC PANEL
AG Ratio: 1.5 (calc) (ref 1.0–2.5)
ALT: 11 U/L (ref 6–29)
AST: 13 U/L (ref 10–35)
Albumin: 4 g/dL (ref 3.6–5.1)
Alkaline phosphatase (APISO): 133 U/L (ref 37–153)
BUN/Creatinine Ratio: 27 (calc) — ABNORMAL HIGH (ref 6–22)
BUN: 26 mg/dL — ABNORMAL HIGH (ref 7–25)
CO2: 33 mmol/L — ABNORMAL HIGH (ref 20–32)
Calcium: 9.4 mg/dL (ref 8.6–10.4)
Chloride: 99 mmol/L (ref 98–110)
Creat: 0.96 mg/dL (ref 0.50–1.05)
Globulin: 2.7 g/dL (ref 1.9–3.7)
Glucose, Bld: 228 mg/dL — ABNORMAL HIGH (ref 65–99)
Potassium: 4.2 mmol/L (ref 3.5–5.3)
Sodium: 138 mmol/L (ref 135–146)
Total Bilirubin: 0.6 mg/dL (ref 0.2–1.2)
Total Protein: 6.7 g/dL (ref 6.1–8.1)

## 2023-12-28 LAB — LIPID PANEL
Cholesterol: 115 mg/dL (ref <200)
HDL: 53 mg/dL (ref >=50)
LDL Cholesterol (Calc): 44 mg/dL
Non-HDL Cholesterol (Calc): 62 mg/dL (ref <130)
Total CHOL/HDL Ratio: 2.2 (calc) (ref <5.0)
Triglycerides: 99 mg/dL (ref <150)

## 2023-12-28 LAB — C-PEPTIDE: C-Peptide: 3.84 ng/mL (ref 0.80–3.85)

## 2023-12-28 LAB — HEMOGLOBIN A1C
Hgb A1c MFr Bld: 10.6 %{Hb} — ABNORMAL HIGH (ref <5.7)
Mean Plasma Glucose: 258 mg/dL
eAG (mmol/L): 14.3 mmol/L

## 2023-12-28 LAB — MICROALBUMIN / CREATININE URINE RATIO
Creatinine, Urine: 90 mg/dL (ref 20–275)
Microalb Creat Ratio: 30 mg/g{creat} — ABNORMAL HIGH (ref <30)
Microalb, Ur: 2.7 mg/dL

## 2023-12-31 NOTE — Telephone Encounter (Signed)
 Forwarded to myself to complete billing.

## 2023-12-31 NOTE — Telephone Encounter (Signed)
 Unreturned/unused itamar sleep device, please proceed with billing

## 2024-01-01 ENCOUNTER — Ambulatory Visit: Payer: 59 | Attending: Internal Medicine

## 2024-01-01 ENCOUNTER — Ambulatory Visit: Payer: 59 | Admitting: "Endocrinology

## 2024-01-01 VITALS — BP 126/65 | HR 79

## 2024-01-01 DIAGNOSIS — Z111 Encounter for screening for respiratory tuberculosis: Secondary | ICD-10-CM | POA: Diagnosis not present

## 2024-01-01 DIAGNOSIS — R52 Pain, unspecified: Secondary | ICD-10-CM

## 2024-01-01 NOTE — Progress Notes (Signed)
Patient is here today for blood pressure check, (pt stat for 10 min before checking) and PPD testing( will return Thursday/Friday). Also patient wanted a new referral sent in to [facility] spine and pain.

## 2024-01-01 NOTE — Therapy (Signed)
OUTPATIENT PHYSICAL THERAPY LOWER EXTREMITY EVALUATION   Patient Name: Katherine [name] MRN: [id number] DOB:03-Apr-1956, 68 y.o., female Today's Date: 01/03/2024  END OF SESSION:  PT End of Session - 01/03/24 1314     Visit Number 1    Number of Visits 13    Date for PT Re-Evaluation 03/07/24    Authorization Type [employer] DUAL COMPLETE; MEDICAID OF [place]    PT Start Time 0933    PT Stop Time 1018    PT Time Calculation (min) 45 min    Activity Tolerance Patient tolerated treatment well    Behavior During Therapy [facility] for tasks assessed/performed             Past Medical History:  Diagnosis Date   Anemia    requiring transfusion   Boil of buttock ~ 2016   Depression    Diabetic neuropathy (HCC)    [name] 08/23/2017   DKA (diabetic ketoacidoses)    recent/notes 01/06/2015   Exertional dyspnea 08/15/2023   Hyperlipidemia    Hypertension    Murmur 10/19/2022   Neuropathy    Peripheral arterial disease (HCC)    Peripheral vascular disease (HCC)    Toe ulcer (HCC)    left great toe/notes 08/23/2017   Type II diabetes mellitus (HCC)    Past Surgical History:  Procedure Laterality Date   ABDOMINAL AORTOGRAM W/LOWER EXTREMITY N/A 04/06/2022   Procedure: ABDOMINAL AORTOGRAM W/LOWER EXTREMITY;  Surgeon: Cephus Shelling, MD;  [facility]: [facility] INVAS[facility] CV LAB;  Service: Cardiovascular;  Laterality: N/A;   AMPUTATION TOE Left 10/26/2017   Procedure: AMPUTATION PARTIAL RAY LEFT FOOT AND IRRIGATION/DEBRIDEMENT;  Surgeon: Felecia Shelling, DPM;  Location: [facility] OR;  Service: Podiatry;  Laterality: Left;  AMPUTATION PARTIAL RAY LEFT FOOT AND IRRIGATION/DEBRIDEMENT   CHOLECYSTECTOMY  01/24/2003   [name] 04/04/2011   COLONOSCOPY     INCISION AND DRAINAGE ABSCESS  03/17/2012   "right but; left lower abdoment"/notes 03/17/2012   LOWER EXTREMITY INTERVENTION N/A 09/10/2017   Procedure: LOWER EXTREMITY INTERVENTION;  Surgeon: Runell Gess, MD;  Location: [facility] INV[facility] CV LAB;  Service:  Cardiovascular;  Laterality: N/A;   PERIPHERAL VASCULAR ATHERECTOMY  09/10/2017   Procedure: PERIPHERAL VASCULAR ATHERECTOMY;  Surgeon: Runell Gess, MD;  Location: [facility] INVASIVE CV LAB;  Service: Cardiovascular;;  left AT   PERIPHERAL VASCULAR BALLOON ANGIOPLASTY Left 04/06/2022   Procedure: PERIPHERAL VASCULAR BALLOON ANGIOPLASTY;  Surgeon: Cephus Shelling, MD;  Location: [facility] INVASIVE CV LAB;  Service: Cardiovascular;  Laterality: Left;   Patient Active Problem List   Diagnosis Date Noted   Exertional dyspnea 08/15/2023   Mild major depression (HCC) 11/24/2022   Murmur 10/19/2022   Medication monitoring encounter 04/10/2022   Cellulitis of left lower extremity    Neuropathy 12/08/2021   Diabetic gastroparesis associated with type 2 diabetes mellitus (HCC) 07/30/2021   Type 2 diabetes mellitus with diabetic peripheral angiopathy without gangrene, with long-term current use of insulin (HCC) 07/30/2021   Amputation of left great toe (HCC) 06/13/2021   Microalbuminuria due to type 2 diabetes mellitus (HCC) 05/04/2021   Diabetes (HCC) 12/22/2019   Renal artery stenosis (HCC) 12/01/2019   Diabetic polyneuropathy associated with type 2 diabetes mellitus (HCC) 05/14/2018   Thyroid nodule 10/04/2017   Microcytic anemia 09/11/2017   Retinopathy 08/22/2017   Thyroid enlargement 07/12/2017   Dyslipidemia 09/20/2016   DEPRESSION 09/10/2009   LEG CRAMPS 05/19/2009   GERD 02/15/2009   Gastroparesis 09/03/2008   Dental caries 07/14/2008   Essential hypertension 09/16/2003  PCP: Marcine Matar, MD   REFERRING PROVIDER: Marcine Matar, MD   REFERRING DIAG:  [phone] (ICD-10-CM) - History of recent fall  R26.9 (ICD-10-CM) - Gait disturbance    THERAPY DIAG:  Difficulty in walking, not elsewhere classified  Muscle weakness (generalized)  Rationale for Evaluation and Treatment: Rehabilitation  ONSET DATE: 3 years  SUBJECTIVE:   SUBJECTIVE STATEMENT: Pt reports  decrease steadiness with walking. She notes her legs feel heavy and she walks with shorter steps. Occasionally, she experiences shortness. Husband is in a [facility] and it provides support when she pushes him in the community. Pt notes she walks regularly within her home. Infrequently, she will use a SPC.  PERTINENT HISTORY: DM neuropathy  PAIN:  Are you having pain? Yes. Pain from knees to feet, coldness. DM neuropathy  PRECAUTIONS: Fall  RED FLAGS: None   WEIGHT BEARING RESTRICTIONS: No  FALLS:  Has patient fallen in last 6 months? No  LIVING ENVIRONMENT: Lives with: lives with their family Lives in: House/apartment  N issue accessing home, has a ramp  OCCUPATION: Retired  PLOF: Independent  PATIENT GOALS: To walk better, be safe  NEXT MD VISIT: 05/01/24, Dr. Laural Benes  OBJECTIVE:  Note: Objective measures were completed at Evaluation unless otherwise noted.  DIAGNOSTIC FINDINGS: NA  PATIENT SURVEYS:  ABC scale 51%  COGNITION: Overall cognitive status: Within functional limits for tasks assessed     SENSATION: WFL  EDEMA:  Swelling of lower legs and feet  POSTURE: flexed trunk    LOWER EXTREMITY ROM: WFLs Active ROM Right eval Left eval  Hip flexion    Hip extension    Hip abduction    Hip adduction    Hip internal rotation    Hip external rotation    Knee flexion    Knee extension    Ankle dorsiflexion    Ankle plantarflexion    Ankle inversion    Ankle eversion     (Blank rows = not tested)  LOWER EXTREMITY MMT:  MMT Right eval Left eval  Hip flexion 3 3  Hip extension 2 2  Hip abduction 3 3  Hip adduction 3 3  Hip internal rotation 3 3  Hip external rotation 3 3  Knee flexion 4 4  Knee extension 4 4  Ankle dorsiflexion 3 3  Ankle plantarflexion 2 2  Ankle inversion    Ankle eversion     (Blank rows = not tested)  FUNCTIONAL TESTS:  5 times sit to stand: 26.3 c use of hands 2 minute walk test: 227' Berg Balance Scale: TBA  -  pre gait O2sat 98%, HR 76. Post gait o2sat 98%, HR 88. Pt denies shortness of breath  GAIT: Distance walked: 200' Assistive device utilized: None Level of assistance: Complete Independence Comments: Decreased pace, LE weakness                                                                                                                  TREATMENT DATE:  [facility] Adult PT  Treatment:                                                DATE: 01/03/24 Therapeutic Exercise: Developed, instructed in, and pt completed therex as noted in HEP   PATIENT EDUCATION:  Education details: Eval findings, POC, HEP, self care  Person educated: Patient Education method: Explanation, Demonstration, Tactile cues, Verbal cues, and Handouts Education comprehension: verbalized understanding, returned demonstration, verbal cues required, and tactile cues required  HOME EXERCISE PROGRAM: Access Code: [email or web address] URL: [email or web address] Date: 01/03/2024 Prepared by: Joellyn Rued  Exercises - Sit to Stand with Counter Support  - 3 x daily - 7 x weekly - 1 sets - 10 reps - Heel Toe Raises with Counter Support  - 3 x daily - 7 x weekly - 1 sets - 10 reps - 2 hold - Standing Hip Abduction with Counter Support  - 3 x daily - 7 x weekly - 1 sets - 15 reps - 3 hold - Standing Single Leg Stance with Unilateral Counter Support  - 3 x daily - 7 x weekly - 1 sets - 10 reps - 20 hold  ASSESSMENT:  CLINICAL IMPRESSION: Patient is a 68 y.o. female who was seen today for physical therapy evaluation and treatment for  Z91.81 (ICD-10-CM) - History of recent fall  R26.9 (ICD-10-CM) - Gait disturbance  Pt presents with signs and symptoms consistent with referral. Pt was positive for weakness of legs, decreased proprioception, decreased balance, and decreased functional mobility. A HEP was started. Pt will benefit from skilled PT 2w8 to address impairments to optimize functional mobility.     OBJECTIVE IMPAIRMENTS:  decreased activity tolerance, decreased balance, difficulty walking, decreased strength, increased edema, and pain.   ACTIVITY LIMITATIONS: carrying, lifting, bending, squatting, bathing, dressing, locomotion level, and caring for others  PARTICIPATION LIMITATIONS: meal prep, cleaning, laundry, shopping, and community activity  PERSONAL FACTORS: Fitness, Past/current experiences, Time since onset of injury/illness/exacerbation, and 1 comorbidity: DM neuropathy  are also affecting patient's functional outcome.   REHAB POTENTIAL: Good  CLINICAL DECISION MAKING: Evolving/moderate complexity  EVALUATION COMPLEXITY: Moderate   GOALS:  SHORT TERM GOALS: Target date: 01/25/24 Pt will be Ind in an initial HEP  Baseline: started Goal status: INITIAL  2.  Improve 5xSTS by 5" and by MCID of 32ft as indication of improved functional mobility  Baseline: see eval Goal status: INITIAL  LONG TERM GOALS: Target date: 03/07/24  Pt will be Ind in a final HEP to maintain achieved LOF Baseline:  Goal status: INITIAL  2.  Increase LE strength by a 1/2 MMT grade for improved functional mobility  Baseline: see flow sheets Goal status: INITIAL  3.  Increase single leg balance to 5" or greater as indication of improved balance Baseline: 1" bilat  Goal status: INITIAL  4.  Improve 5xSTS by MCID of 10" and by MCID of 68ft as indication of improved functional mobility  Baseline: 26.3 and 227' respectively Goal status: INITIAL  5.  Pt's ABC scale score to 67% or greater as indication of improved balance confidence Baseline: 51% Goal status: INITIAL  6.  Improve Berg by 5 points as indication of improved functional mobility  Baseline: TBA Goal status: INITIAL   PLAN:  PT FREQUENCY: 2x/week  PT DURATION: 8 weeks  PLANNED INTERVENTIONS: 97164- PT Re-evaluation, 97110-Therapeutic exercises, 97530- Therapeutic activity, 97112-  Neuromuscular re-education, [phone]- Self Care, 19147- Manual  therapy, [phone]- Gait training, Patient/Family education, Balance training, and Stair training  PLAN FOR NEXT SESSION: Assess Berg; assess response to HEP; progress therex as indicated.  Randolf Sansoucie MS, PT 01/03/24 1:55 PM

## 2024-01-03 ENCOUNTER — Ambulatory Visit: Payer: 59 | Attending: Internal Medicine

## 2024-01-03 ENCOUNTER — Other Ambulatory Visit: Payer: Self-pay

## 2024-01-03 DIAGNOSIS — R262 Difficulty in walking, not elsewhere classified: Secondary | ICD-10-CM | POA: Diagnosis present

## 2024-01-03 DIAGNOSIS — M6281 Muscle weakness (generalized): Secondary | ICD-10-CM | POA: Insufficient documentation

## 2024-01-03 DIAGNOSIS — R269 Unspecified abnormalities of gait and mobility: Secondary | ICD-10-CM | POA: Diagnosis not present

## 2024-01-03 DIAGNOSIS — Z9181 History of falling: Secondary | ICD-10-CM | POA: Diagnosis not present

## 2024-01-03 LAB — TB SKIN TEST
Induration: 0 mm
TB Skin Test: NEGATIVE

## 2024-01-03 NOTE — Progress Notes (Signed)
Pt came in for PPD reading PT was within time frame and copy was give  Reading is negative

## 2024-01-04 ENCOUNTER — Other Ambulatory Visit: Payer: Self-pay

## 2024-01-04 DIAGNOSIS — I739 Peripheral vascular disease, unspecified: Secondary | ICD-10-CM

## 2024-01-08 ENCOUNTER — Encounter: Payer: Self-pay | Admitting: Physical Therapy

## 2024-01-08 ENCOUNTER — Ambulatory Visit: Payer: 59 | Admitting: Physical Therapy

## 2024-01-08 DIAGNOSIS — M6281 Muscle weakness (generalized): Secondary | ICD-10-CM

## 2024-01-08 DIAGNOSIS — R262 Difficulty in walking, not elsewhere classified: Secondary | ICD-10-CM | POA: Diagnosis not present

## 2024-01-08 NOTE — Therapy (Signed)
 OUTPATIENT PHYSICAL THERAPY LOWER EXTREMITY TREATMENT   Patient Name: Katherine [name] MRN: [id number] DOB:[birth year], 68 y.o., female Today's Date: 01/08/2024  END OF SESSION:  PT End of Session - 01/08/24 0731     Visit Number 2    Number of Visits 13    Date for PT Re-Evaluation 03/07/24    Authorization Type [employer] DUAL COMPLETE; MEDICAID OF [place]    PT Start Time 0730    PT Stop Time 0756    PT Time Calculation (min) 26 min             Past Medical History:  Diagnosis Date   Anemia    requiring transfusion   Boil of buttock ~ 2016   Depression    Diabetic neuropathy (HCC)    [name] 08/23/2017   DKA (diabetic ketoacidoses)    recent/notes 01/06/2015   Exertional dyspnea 08/15/2023   Hyperlipidemia    Hypertension    Murmur 10/19/2022   Neuropathy    Peripheral arterial disease (HCC)    Peripheral vascular disease (HCC)    Toe ulcer (HCC)    left great toe/notes 08/23/2017   Type II diabetes mellitus (HCC)    Past Surgical History:  Procedure Laterality Date   ABDOMINAL AORTOGRAM W/LOWER EXTREMITY N/A 04/06/2022   Procedure: ABDOMINAL AORTOGRAM W/LOWER EXTREMITY;  Surgeon: Cephus Shelling, MD;  [facility]: [facility] INV[facility];  Service: Cardiovascular;  Laterality: N/A;   AMPUTATION TOE Left 10/26/2017   Procedure: AMPUTATION PARTIAL RAY LEFT FOOT AND IRRIGATION/DEBRIDEMENT;  Surgeon: Felecia Shelling, DPM;  Location: [facility] OR;  Service: Podiatry;  Laterality: Left;  AMPUTATION PARTIAL RAY LEFT FOOT AND IRRIGATION/DEBRIDEMENT   CHOLECYSTECTOMY  01/24/2003   [name] 04/04/2011   COLONOSCOPY     INCISION AND DRAINAGE ABSCESS  03/17/2012   "right but; left lower abdoment"/notes 03/17/2012   LOWER EXTREMITY INTERVENTION N/A 09/10/2017   Procedure: LOWER EXTREMITY INTERVENTION;  Surgeon: Runell Gess, MD;  [facility];  Service: Cardiovascular;  Laterality: N/A;   PERIPHERAL VASCULAR ATHERECTOMY  09/10/2017   Procedure: PERIPHERAL VASCULAR  ATHERECTOMY;  Surgeon: Runell Gess, MD;  Location: [facility] INVASIVE CV LAB;  Service: Cardiovascular;;  left AT   PERIPHERAL VASCULAR BALLOON ANGIOPLASTY Left 04/06/2022   Procedure: PERIPHERAL VASCULAR BALLOON ANGIOPLASTY;  Surgeon: Cephus Shelling, MD;  [facility]: [facility]VAS[facility];  Service: Cardiovascular;  Laterality: Left;   Patient Active Problem List   Diagnosis Date Noted   Exertional dyspnea 08/15/2023   Mild major depression (HCC) 11/24/2022   Murmur 10/19/2022   Medication monitoring encounter 04/10/2022   Cellulitis of left lower extremity    Neuropathy 12/08/2021   Diabetic gastroparesis associated with type 2 diabetes mellitus (HCC) 07/30/2021   Type 2 diabetes mellitus with diabetic peripheral angiopathy without gangrene, with long-term current use of insulin (HCC) 07/30/2021   Amputation of left great toe (HCC) 06/13/2021   Microalbuminuria due to type 2 diabetes mellitus (HCC) 05/04/2021   Diabetes (HCC) 12/22/2019   Renal artery stenosis (HCC) 12/01/2019   Diabetic polyneuropathy associated with type 2 diabetes mellitus (HCC) 05/14/2018   Thyroid nodule 10/04/2017   Microcytic anemia 09/11/2017   Retinopathy 08/22/2017   Thyroid enlargement 07/12/2017   Dyslipidemia 09/20/2016   DEPRESSION 09/10/2009   LEG CRAMPS 05/19/2009   GERD 02/15/2009   Gastroparesis 09/03/2008   Dental caries 07/14/2008   Essential hypertension 09/16/2003    PCP: Marcine Matar, MD   REFERRING PROVIDER: Marcine Matar, MD   REFERRING  DIAG:  Z91.81 (ICD-10-CM) - History of recent fall  R26.9 (ICD-10-CM) - Gait disturbance    THERAPY DIAG:  Difficulty in walking, not elsewhere classified  Muscle weakness (generalized)  Rationale for Evaluation and Treatment: Rehabilitation  ONSET DATE: 3 years  SUBJECTIVE:   SUBJECTIVE STATEMENT: Pt reports less leg pain this morning. Yesterday was terrible.   EVAL: Pt reports decrease steadiness with walking. She notes  her legs feel heavy and she walks with shorter steps. Occasionally, she experiences shortness. Husband is in a [facility] and it provides support when she pushes him in the community. Pt notes she walks regularly within her home. Infrequently, she will use a SPC.  PERTINENT HISTORY: DM neuropathy  PAIN:  Are you having pain? Yes. 5/10.  Pain from knees to feet, coldness. DM neuropathy  PRECAUTIONS: Fall  RED FLAGS: None   WEIGHT BEARING RESTRICTIONS: No  FALLS:  Has patient fallen in last 6 months? No  LIVING ENVIRONMENT: Lives with: lives with their family Lives in: House/apartment  N issue accessing home, has a ramp  OCCUPATION: Retired  PLOF: Independent  PATIENT GOALS: To walk better, be safe  NEXT MD VISIT: 05/01/24, Dr. Laural Benes  OBJECTIVE:  Note: Objective measures were completed at Evaluation unless otherwise noted.  DIAGNOSTIC FINDINGS: NA  PATIENT SURVEYS:  ABC scale 51%  COGNITION: Overall cognitive status: Within functional limits for tasks assessed     SENSATION: WFL  EDEMA:  Swelling of lower legs and feet  POSTURE: flexed trunk    LOWER EXTREMITY ROM: WFLs Active ROM Right eval Left eval  Hip flexion    Hip extension    Hip abduction    Hip adduction    Hip internal rotation    Hip external rotation    Knee flexion    Knee extension    Ankle dorsiflexion    Ankle plantarflexion    Ankle inversion    Ankle eversion     (Blank rows = not tested)  LOWER EXTREMITY MMT:  MMT Right eval Left eval  Hip flexion 3 3  Hip extension 2 2  Hip abduction 3 3  Hip adduction 3 3  Hip internal rotation 3 3  Hip external rotation 3 3  Knee flexion 4 4  Knee extension 4 4  Ankle dorsiflexion 3 3  Ankle plantarflexion 2 2  Ankle inversion    Ankle eversion     (Blank rows = not tested)  FUNCTIONAL TESTS:  5 times sit to stand: 26.3 c use of hands 2 minute walk test: 227' Berg Balance Scale: TBA  - pre gait O2sat 98%, HR 76. Post gait  o2sat 98%, HR 88. Pt denies shortness of breath   GAIT: Distance walked: 200'  Assistive device utilized: None Level of assistance: Complete Independence Comments: Decreased pace, LE weakness                                                                                                                  TREATMENT DATE:  [facility] Adult  PT Treatment:                                                DATE: 01/08/24 Therapeutic Exercise: Heel/ toe raises with cues to use UE support at counter top.  Standing hip abduction x 15 each  SLS with UE support x 10 sec each - diffulty holding RLE up Standing March  Sit to stand with UE rise, no UE to descend  Seated heel and toe raise Seated March  Updated HEP    [facility] Adult PT Treatment:                                                DATE: 01/03/24 Therapeutic Exercise: Developed, instructed in, and pt completed therex as noted in HEP   PATIENT EDUCATION:  Education details: Eval findings, POC, HEP, self care  Person educated: Patient Education method: Explanation, Demonstration, Tactile cues, Verbal cues, and Handouts Education comprehension: verbalized understanding, returned demonstration, verbal cues required, and tactile cues required  HOME EXERCISE PROGRAM: Access Code: [email or web address] URL: [email or web address] Date: 01/03/2024 Prepared by: Joellyn Rued  Exercises - Sit to Stand with Counter Support  - 3 x daily - 7 x weekly - 1 sets - 10 reps - Heel Toe Raises with Counter Support  - 3 x daily - 7 x weekly - 1 sets - 10 reps - 2 hold - Standing Hip Abduction with Counter Support  - 3 x daily - 7 x weekly - 1 sets - 15 reps - 3 hold - Standing Single Leg Stance with Unilateral Counter Support  - 3 x daily - 7 x weekly - 1 sets - 10 reps - 20 hold 01/08/24:  Seated march   ASSESSMENT:  CLINICAL IMPRESSION: Pt arrives late reporting compliance with HEP. Upon review, it was found that she was attempting to complete all HEP without  UE support. Fortunately, she did not fall at home. She was given verbal instruction to use UE with all HEP as written. She verbalized understanding and was able to complete HEP safely in clinic.  Her HEP was updated to add hip flexion strengthening.   EVAL; Patient is a 68 y.o. female who was seen today for physical therapy evaluation and treatment for  Z91.81 (ICD-10-CM) - History of recent fall  R26.9 (ICD-10-CM) - Gait disturbance  Pt presents with signs and symptoms consistent with referral. Pt was positive for weakness of legs, decreased proprioception, decreased balance, and decreased functional mobility. A HEP was started. Pt will benefit from skilled PT 2w8 to address impairments to optimize functional mobility.     OBJECTIVE IMPAIRMENTS: decreased activity tolerance, decreased balance, difficulty walking, decreased strength, increased edema, and pain.   ACTIVITY LIMITATIONS: carrying, lifting, bending, squatting, bathing, dressing, locomotion level, and caring for others  PARTICIPATION LIMITATIONS: meal prep, cleaning, laundry, shopping, and community activity  PERSONAL FACTORS: Fitness, Past/current experiences, Time since onset of injury/illness/exacerbation, and 1 comorbidity: DM neuropathy  are also affecting patient's functional outcome.   REHAB POTENTIAL: Good  CLINICAL DECISION MAKING: Evolving/moderate complexity  EVALUATION COMPLEXITY: Moderate   GOALS:  SHORT TERM GOALS: Target date: 01/25/24 Pt will be Ind in an initial HEP  Baseline: started Goal status: INITIAL  2.  Improve  5xSTS by 5" and by MCID of 82ft as indication of improved functional mobility  Baseline: see eval Goal status: INITIAL  LONG TERM GOALS: Target date: 03/07/24  Pt will be Ind in a final HEP to maintain achieved LOF Baseline:  Goal status: INITIAL  2.  Increase LE strength by a 1/2 MMT grade for improved functional mobility  Baseline: see flow sheets Goal status: INITIAL  3.   Increase single leg balance to 5" or greater as indication of improved balance Baseline: 1" bilat  Goal status: INITIAL  4.  Improve 5xSTS by MCID of 10" and by MCID of 46ft as indication of improved functional mobility  Baseline: 26.3 and 227' respectively Goal status: INITIAL  5.  Pt's ABC scale score to 67% or greater as indication of improved balance confidence Baseline: 51% Goal status: INITIAL  6.  Improve Berg by 5 points as indication of improved functional mobility  Baseline: TBA Goal status: INITIAL   PLAN:  PT FREQUENCY: 2x/week  PT DURATION: 8 weeks  PLANNED INTERVENTIONS: 97164- PT Re-evaluation, 97110-Therapeutic exercises, 97530- Therapeutic activity, 97112- Neuromuscular re-education, 97535- Self Care, 16109- Manual therapy, [phone]- Gait training, Patient/Family education, Balance training, and Stair training  PLAN FOR NEXT SESSION: Assess Berg; assess response to HEP; progress therex as indicated.  Jannette Spanner, PTA 01/08/24 8:04 AM Phone: [phone] Fax: [phone]

## 2024-01-10 ENCOUNTER — Ambulatory Visit: Payer: 59 | Admitting: Physical Therapy

## 2024-01-14 NOTE — Therapy (Unsigned)
 OUTPATIENT PHYSICAL THERAPY LOWER EXTREMITY TREATMENT   Patient Name: Katherine [name] MRN: [id number] DOB:04/05/1956, 68 y.o., female Today's Date: 01/15/2024  END OF SESSION:  PT End of Session - 01/15/24 0811     Visit Number 3    Number of Visits 13    Date for PT Re-Evaluation 03/07/24    Authorization Type [employer]PLETE; MEDICAID OF [place]    PT Start Time 0806    PT Stop Time 0848    PT Time Calculation (min) 42 min    Activity Tolerance Patient tolerated treatment well    Behavior During Therapy [facility] for tasks assessed/performed              Past Medical History:  Diagnosis Date   Anemia    requiring transfusion   Boil of buttock ~ 2016   Depression    Diabetic neuropathy (HCC)    [name] 08/23/2017   DKA (diabetic ketoacidoses)    recent/notes 01/06/2015   Exertional dyspnea 08/15/2023   Hyperlipidemia    Hypertension    Murmur 10/19/2022   Neuropathy    Peripheral arterial disease (HCC)    Peripheral vascular disease (HCC)    Toe ulcer (HCC)    left great toe/notes 08/23/2017   Type II diabetes mellitus (HCC)    Past Surgical History:  Procedure Laterality Date   ABDOMINAL AORTOGRAM W/LOWER EXTREMITY N/A 04/06/2022   Procedure: ABDOMINAL AORTOGRAM W/LOWER EXTREMITY;  Surgeon: Cephus Shelling, MD;  Location: [facility];  Service: Cardiovascular;  Laterality: N/A;   AMPUTATION TOE Left 10/26/2017   Procedure: AMPUTATION PARTIAL RAY LEFT FOOT AND IRRIGATION/DEBRIDEMENT;  Surgeon: Felecia Shelling, DPM;  Location: [facility] OR;  Service: Podiatry;  Laterality: Left;  AMPUTATION PARTIAL RAY LEFT FOOT AND IRRIGATION/DEBRIDEMENT   CHOLECYSTECTOMY  01/24/2003   [name] 04/04/2011   COLONOSCOPY     INCISION AND DRAINAGE ABSCESS  03/17/2012   "right but; left lower abdoment"/notes 03/17/2012   LOWER EXTREMITY INTERVENTION N/A 09/10/2017   Procedure: LOWER EXTREMITY INTERVENTION;  Surgeon: Runell Gess, MD;  Location: [facility];  Service:  Cardiovascular;  Laterality: N/A;   PERIPHERAL VASCULAR ATHERECTOMY  09/10/2017   Procedure: PERIPHERAL VASCULAR ATHERECTOMY;  Surgeon: Runell Gess, MD;  Location: [facility] INVASIVE CV LAB;  Service: Cardiovascular;;  left AT   PERIPHERAL VASCULAR BALLOON ANGIOPLASTY Left 04/06/2022   Procedure: PERIPHERAL VASCULAR BALLOON ANGIOPLASTY;  Surgeon: Cephus Shelling, MD;  Location: [facility];  Service: Cardiovascular;  Laterality: Left;   Patient Active Problem List   Diagnosis Date Noted   Exertional dyspnea 08/15/2023   Mild major depression (HCC) 11/24/2022   Murmur 10/19/2022   Medication monitoring encounter 04/10/2022   Cellulitis of left lower extremity    Neuropathy 12/08/2021   Diabetic gastroparesis associated with type 2 diabetes mellitus (HCC) 07/30/2021   Type 2 diabetes mellitus with diabetic peripheral angiopathy without gangrene, with long-term current use of insulin (HCC) 07/30/2021   Amputation of left great toe (HCC) 06/13/2021   Microalbuminuria due to type 2 diabetes mellitus (HCC) 05/04/2021   Diabetes (HCC) 12/22/2019   Renal artery stenosis (HCC) 12/01/2019   Diabetic polyneuropathy associated with type 2 diabetes mellitus (HCC) 05/14/2018   Thyroid nodule 10/04/2017   Microcytic anemia 09/11/2017   Retinopathy 08/22/2017   Thyroid enlargement 07/12/2017   Dyslipidemia 09/20/2016   DEPRESSION 09/10/2009   LEG CRAMPS 05/19/2009   GERD 02/15/2009   Gastroparesis 09/03/2008   Dental caries 07/14/2008   Essential hypertension  09/16/2003    PCP: Marcine Matar, MD   REFERRING PROVIDER: Marcine Matar, MD   REFERRING DIAG:  [phone] (ICD-10-CM) - History of recent fall  R26.9 (ICD-10-CM) - Gait disturbance    THERAPY DIAG:  Difficulty in walking, not elsewhere classified  Muscle weakness (generalized)  Rationale for Evaluation and Treatment: Rehabilitation  ONSET DATE: 3 years  SUBJECTIVE:   SUBJECTIVE STATEMENT: Pt tired, has a  little bit of pain in his legs.   EVAL: Pt reports decrease steadiness with walking. She notes her legs feel heavy and she walks with shorter steps. Occasionally, she experiences shortness. Husband is in a [facility] and it provides support when she pushes him in the community. Pt notes she walks regularly within her home. Infrequently, she will use a SPC.  PERTINENT HISTORY: DM neuropathy Lt Big Toe amputation (2019) Vein procedures due to stenosis every couple of years   PAIN:  Are you having pain? Yes. 2/10.  Pain from knees to feet, coldness. DM neuropathy  PRECAUTIONS: Fall  RED FLAGS: None   WEIGHT BEARING RESTRICTIONS: No  FALLS:  Has patient fallen in last 6 months? No  LIVING ENVIRONMENT: Lives with: lives with their family Lives in: House/apartment  N issue accessing home, has a ramp  OCCUPATION: Retired  PLOF: Independent  PATIENT GOALS: To walk better, be safe  NEXT MD VISIT: 05/01/24, Dr. Laural Benes  OBJECTIVE:  Note: Objective measures were completed at Evaluation unless otherwise noted.  DIAGNOSTIC FINDINGS: NA  PATIENT SURVEYS:  ABC scale 51%  COGNITION: Overall cognitive status: Within functional limits for tasks assessed     SENSATION: WFL  EDEMA:  Swelling of lower legs and feet  POSTURE: flexed trunk    LOWER EXTREMITY ROM: WFLs Active ROM Right eval Left eval  Hip flexion    Hip extension    Hip abduction    Hip adduction    Hip internal rotation    Hip external rotation    Knee flexion    Knee extension    Ankle dorsiflexion    Ankle plantarflexion    Ankle inversion    Ankle eversion     (Blank rows = not tested)  LOWER EXTREMITY MMT:  MMT Right eval Left eval  Hip flexion 3 3  Hip extension 2 2  Hip abduction 3 3  Hip adduction 3 3  Hip internal rotation 3 3  Hip external rotation 3 3  Knee flexion 4 4  Knee extension 4 4  Ankle dorsiflexion 3 3  Ankle plantarflexion 2 2  Ankle inversion    Ankle eversion      (Blank rows = not tested)  FUNCTIONAL TESTS:  5 times sit to stand: 26.3 c use of hands 2 minute walk test: 227' Berg Balance Scale: TBA  - pre gait O2sat 98%, HR 76. Post gait o2sat 98%, HR 88. Pt denies shortness of breath   GAIT: Distance walked: 200'  Assistive device utilized: None Level of assistance: Complete Independence Comments: Decreased pace, LE weakness  TREATMENT DATE:   [facility] Adult PT Treatment:                                                DATE: 01/15/24  Therapeutic Activity: NuStep L5 UE and LE 7 min  Seated LAQ with ball 3 lbs  Seated PF with cuff wgts 3 lbs  Physical Performance : Berg Balance Test see below  Self Care: Risk of falls, use of cane or walker Navigating community with cane Barriers to good balance and gait stability     01/15/24 0001  Berg Balance Test  Standing Unsupported 4  Sitting with Back Unsupported but Feet Supported on Floor or Stool 4  Stand to Sit 4  Transfers 3  Standing Unsupported with Eyes Closed 3  Standing Unsupported with Feet Together 3  From Standing, Reach Forward with Outstretched Arm 2  From Standing Position, Pick up Object from Floor 4  From Standing Position, Turn to Look Behind Over each Shoulder 3  Turn 360 Degrees 2  Standing Unsupported, Alternately Place Feet on Step/Stool 1  Standing Unsupported, One Foot in Front 1  Standing on One Leg 0  38/56 high fall risk   [facility] Adult PT Treatment:                                                DATE: 01/08/24 Therapeutic Exercise: Heel/ toe raises with cues to use UE support at counter top.  Standing hip abduction x 15 each  SLS with UE support x 10 sec each - diffulty holding RLE up Standing March  Sit to stand with UE rise, no UE to descend  Seated heel and toe raise Seated March  Updated HEP    [facility] Adult PT Treatment:                                                 DATE: 01/03/24 Therapeutic Exercise: Developed, instructed in, and pt completed therex as noted in HEP   PATIENT EDUCATION:  Education details: Eval findings, POC, HEP, self care  Person educated: Patient Education method: Explanation, Demonstration, Tactile cues, Verbal cues, and Handouts Education comprehension: verbalized understanding, returned demonstration, verbal cues required, and tactile cues required  HOME EXERCISE PROGRAM: Access Code: [email or web address] URL: [email or web address] Date: 01/03/2024 Prepared by: Joellyn Rued  Exercises - Sit to Stand with Counter Support  - 3 x daily - 7 x weekly - 1 sets - 10 reps - Heel Toe Raises with Counter Support  - 3 x daily - 7 x weekly - 1 sets - 10 reps - 2 hold - Standing Hip Abduction with Counter Support  - 3 x daily - 7 x weekly - 1 sets - 15 reps - 3 hold - Standing Single Leg Stance with Unilateral Counter Support  - 3 x daily - 7 x weekly - 1 sets - 10 reps - 20 hold 01/08/24:  Seated march   ASSESSMENT:  CLINICAL IMPRESSION: Patient with significant balance issues and scored 38/56 on Berg indicating high risk of falls. Recommended she use her cane when walking in the  community.  She states she uses her husbands WC or the buggy at the grocery store for support.  She has barriers to gait stability including foot amputation, neuropathy and weakness. She will continue to benefit from skilled PT in order to improve mobility and stability with gait and balance     EVAL; Patient is a 68 y.o. female who was seen today for physical therapy evaluation and treatment for  Z91.81 (ICD-10-CM) - History of recent fall  R26.9 (ICD-10-CM) - Gait disturbance  Pt presents with signs and symptoms consistent with referral. Pt was positive for weakness of legs, decreased proprioception, decreased balance, and decreased functional mobility. A HEP was started. Pt will benefit from skilled PT 2w8 to address impairments  to optimize functional mobility.     OBJECTIVE IMPAIRMENTS: decreased activity tolerance, decreased balance, difficulty walking, decreased strength, increased edema, and pain.   ACTIVITY LIMITATIONS: carrying, lifting, bending, squatting, bathing, dressing, locomotion level, and caring for others  PARTICIPATION LIMITATIONS: meal prep, cleaning, laundry, shopping, and community activity  PERSONAL FACTORS: Fitness, Past/current experiences, Time since onset of injury/illness/exacerbation, and 1 comorbidity: DM neuropathy  are also affecting patient's functional outcome.   REHAB POTENTIAL: Good  CLINICAL DECISION MAKING: Evolving/moderate complexity  EVALUATION COMPLEXITY: Moderate   GOALS:  SHORT TERM GOALS: Target date: 01/25/24 Pt will be Ind in an initial HEP  Baseline: started Goal status: ongoing   2.  Improve 5xSTS by 5" and by MCID of 38ft as indication of improved functional mobility  Baseline: see eval Goal status: INITIAL  LONG TERM GOALS: Target date: 03/07/24  Pt will be Ind in a final HEP to maintain achieved LOF Baseline:  Goal status: INITIAL  2.  Increase LE strength by a 1/2 MMT grade for improved functional mobility  Baseline: see flow sheets Goal status: INITIAL  3.  Increase single leg balance to 5" or greater as indication of improved balance Baseline: 1" bilat  Goal status: INITIAL  4.  Improve 5xSTS by MCID of 10" and by MCID of 23ft as indication of improved functional mobility  Baseline: 26.3 and 227' respectively Goal status: INITIAL  5.  Pt's ABC scale score to 67% or greater as indication of improved balance confidence Baseline: 51% Goal status: INITIAL  6.  Improve Berg by 5 points as indication of improved functional mobility  Baseline: 38/56  Goal status: INITIAL   PLAN:  PT FREQUENCY: 2x/week  PT DURATION: 8 weeks  PLANNED INTERVENTIONS: 97164- PT Re-evaluation, 97110-Therapeutic exercises, 97530- Therapeutic activity,  97112- Neuromuscular re-education, 97535- Self Care, 59563- Manual therapy, [id number]- Gait training, Patient/Family education, Balance training, and Stair training  PLAN FOR NEXT SESSION: Assess Berg; assess response to HEP; progress therex as indicated.   Karie Mainland, PT 01/15/24 10:54 AM Phone: [phone] Fax: [phone]

## 2024-01-15 ENCOUNTER — Ambulatory Visit: Payer: 59 | Admitting: Physical Therapy

## 2024-01-15 ENCOUNTER — Encounter: Payer: Self-pay | Admitting: Physical Therapy

## 2024-01-15 DIAGNOSIS — R262 Difficulty in walking, not elsewhere classified: Secondary | ICD-10-CM | POA: Diagnosis not present

## 2024-01-15 DIAGNOSIS — M6281 Muscle weakness (generalized): Secondary | ICD-10-CM

## 2024-01-15 NOTE — Progress Notes (Signed)
   01/15/24 0001  Berg Balance Test  Standing Unsupported 4  Sitting with Back Unsupported but Feet Supported on Floor or Stool 4  Stand to Sit 4  Transfers 3  Standing Unsupported with Eyes Closed 3  Standing Unsupported with Feet Together 3  From Standing, Reach Forward with Outstretched Arm 2  From Standing Position, Pick up Object from Floor 4  From Standing Position, Turn to Look Behind Over each Shoulder 3  Turn 360 Degrees 2  Standing Unsupported, Alternately Place Feet on Step/Stool 1  Standing Unsupported, One Foot in Front 1  Standing on One Leg 0

## 2024-01-17 ENCOUNTER — Encounter: Payer: Self-pay | Admitting: Physical Therapy

## 2024-01-17 ENCOUNTER — Ambulatory Visit: Payer: 59 | Admitting: Physical Therapy

## 2024-01-17 DIAGNOSIS — R262 Difficulty in walking, not elsewhere classified: Secondary | ICD-10-CM

## 2024-01-17 DIAGNOSIS — M6281 Muscle weakness (generalized): Secondary | ICD-10-CM

## 2024-01-17 NOTE — Therapy (Signed)
 OUTPATIENT PHYSICAL THERAPY LOWER EXTREMITY TREATMENT   Patient Name: Katherine [name] MRN: [id number] DOB:24-May-1956, 68 y.o., female Today's Date: 01/17/2024  END OF SESSION:  PT End of Session - 01/17/24 0939     Visit Number 4    Number of Visits 13    Date for PT Re-Evaluation 03/07/24    Authorization Type [employer] DUAL COMPLETE; MEDICAID OF [place]    PT Start Time 0936    PT Stop Time 1016    PT Time Calculation (min) 40 min              Past Medical History:  Diagnosis Date   Anemia    requiring transfusion   Boil of buttock ~ 2016   Depression    Diabetic neuropathy (HCC)    [name] 08/23/2017   DKA (diabetic ketoacidoses)    recent/notes 01/06/2015   Exertional dyspnea 08/15/2023   Hyperlipidemia    Hypertension    Murmur 10/19/2022   Neuropathy    Peripheral arterial disease (HCC)    Peripheral vascular disease (HCC)    Toe ulcer (HCC)    left great toe/notes 08/23/2017   Type II diabetes mellitus (HCC)    Past Surgical History:  Procedure Laterality Date   ABDOMINAL AORTOGRAM W/LOWER EXTREMITY N/A 04/06/2022   Procedure: ABDOMINAL AORTOGRAM W/LOWER EXTREMITY;  Surgeon: Cephus Shelling, MD;  Location: [facility];  Service: Cardiovascular;  Laterality: N/A;   AMPUTATION TOE Left 10/26/2017   Procedure: AMPUTATION PARTIAL RAY LEFT FOOT AND IRRIGATION/DEBRIDEMENT;  Surgeon: Felecia Shelling, DPM;  Location: [facility] OR;  Service: Podiatry;  Laterality: Left;  AMPUTATION PARTIAL RAY LEFT FOOT AND IRRIGATION/DEBRIDEMENT   CHOLECYSTECTOMY  01/24/2003   [name] 04/04/2011   COLONOSCOPY     INCISION AND DRAINAGE ABSCESS  03/17/2012   "right but; left lower abdoment"/notes 03/17/2012   LOWER EXTREMITY INTERVENTION N/A 09/10/2017   Procedure: LOWER EXTREMITY INTERVENTION;  Surgeon: Runell Gess, MD;  Location: [facility];  Service: Cardiovascular;  Laterality: N/A;   PERIPHERAL VASCULAR ATHERECTOMY  09/10/2017   Procedure: PERIPHERAL VASCULAR  ATHERECTOMY;  Surgeon: Runell Gess, MD;  Location: [facility] INVASIVE CV LAB;  Service: Cardiovascular;;  left AT   PERIPHERAL VASCULAR BALLOON ANGIOPLASTY Left 04/06/2022   Procedure: PERIPHERAL VASCULAR BALLOON ANGIOPLASTY;  Surgeon: Cephus Shelling, MD;  Location: [facility];  Service: Cardiovascular;  Laterality: Left;   Patient Active Problem List   Diagnosis Date Noted   Exertional dyspnea 08/15/2023   Mild major depression (HCC) 11/24/2022   Murmur 10/19/2022   Medication monitoring encounter 04/10/2022   Cellulitis of left lower extremity    Neuropathy 12/08/2021   Diabetic gastroparesis associated with type 2 diabetes mellitus (HCC) 07/30/2021   Type 2 diabetes mellitus with diabetic peripheral angiopathy without gangrene, with long-term current use of insulin (HCC) 07/30/2021   Amputation of left great toe (HCC) 06/13/2021   Microalbuminuria due to type 2 diabetes mellitus (HCC) 05/04/2021   Diabetes (HCC) 12/22/2019   Renal artery stenosis (HCC) 12/01/2019   Diabetic polyneuropathy associated with type 2 diabetes mellitus (HCC) 05/14/2018   Thyroid nodule 10/04/2017   Microcytic anemia 09/11/2017   Retinopathy 08/22/2017   Thyroid enlargement 07/12/2017   Dyslipidemia 09/20/2016   DEPRESSION 09/10/2009   LEG CRAMPS 05/19/2009   GERD 02/15/2009   Gastroparesis 09/03/2008   Dental caries 07/14/2008   Essential hypertension 09/16/2003    PCP: Marcine Matar, MD   REFERRING PROVIDER: Marcine Matar, MD  REFERRING DIAG:  Z91.81 (ICD-10-CM) - History of recent fall  R26.9 (ICD-10-CM) - Gait disturbance    THERAPY DIAG:  Difficulty in walking, not elsewhere classified  Muscle weakness (generalized)  Rationale for Evaluation and Treatment: Rehabilitation  ONSET DATE: 3 years  SUBJECTIVE:   SUBJECTIVE STATEMENT: The cane is in the car. I am usually holding on to my husbands Wheel chair when I am not here.   EVAL: Pt reports decrease  steadiness with walking. She notes her legs feel heavy and she walks with shorter steps. Occasionally, she experiences shortness. Husband is in a [facility] and it provides support when she pushes him in the community. Pt notes she walks regularly within her home. Infrequently, she will use a SPC.  PERTINENT HISTORY: DM neuropathy Lt Big Toe amputation (2019) Vein procedures due to stenosis every couple of years   PAIN:  Are you having pain? Yes. 2/10.  Pain from knees to feet, coldness. DM neuropathy  PRECAUTIONS: Fall  RED FLAGS: None   WEIGHT BEARING RESTRICTIONS: No  FALLS:  Has patient fallen in last 6 months? No  LIVING ENVIRONMENT: Lives with: lives with their family Lives in: House/apartment  N issue accessing home, has a ramp  OCCUPATION: Retired  PLOF: Independent  PATIENT GOALS: To walk better, be safe  NEXT MD VISIT: 05/01/24, Dr. Laural Benes  OBJECTIVE:  Note: Objective measures were completed at Evaluation unless otherwise noted.  DIAGNOSTIC FINDINGS: NA  PATIENT SURVEYS:  ABC scale 51%  COGNITION: Overall cognitive status: Within functional limits for tasks assessed     SENSATION: WFL  EDEMA:  Swelling of lower legs and feet  POSTURE: flexed trunk    LOWER EXTREMITY ROM: WFLs Active ROM Right eval Left eval  Hip flexion    Hip extension    Hip abduction    Hip adduction    Hip internal rotation    Hip external rotation    Knee flexion    Knee extension    Ankle dorsiflexion    Ankle plantarflexion    Ankle inversion    Ankle eversion     (Blank rows = not tested)  LOWER EXTREMITY MMT:  MMT Right eval Left eval  Hip flexion 3 3  Hip extension 2 2  Hip abduction 3 3  Hip adduction 3 3  Hip internal rotation 3 3  Hip external rotation 3 3  Knee flexion 4 4  Knee extension 4 4  Ankle dorsiflexion 3 3  Ankle plantarflexion 2 2  Ankle inversion    Ankle eversion     (Blank rows = not tested)  FUNCTIONAL TESTS:  5 times sit to  stand: 26.3 c use of hands 2 minute walk test: 227' Berg Balance Scale: TBA  - pre gait O2sat 98%, HR 76. Post gait o2sat 98%, HR 88. Pt denies shortness of breath   GAIT: Distance walked: 200'  Assistive device utilized: None Level of assistance: Complete Independence Comments: Decreased pace, LE weakness  TREATMENT DATE:  [facility] Adult PT Treatment:                                                DATE: 01/17/24  Therapeutic Activity: Nustep L5 UE/LE x 7 minutes  Heel raises x 20  Alternating March  Hip abduction 10 x 2 each  Side stepping in parallel bars , 2 passes each way STS x 10 standard chair- needs UE to rise STS x 10 added AIREX pad to chair- no UE   Neuro re-education:  Staggered and semi tandem stance  SLS with light touch required  Alternating step taps 2 finger touch     [facility] Adult PT Treatment:                                                DATE: 01/15/24  Therapeutic Activity: NuStep L5 UE and LE 7 min  Seated LAQ with ball 3 lbs  Seated PF with cuff wgts 3 lbs  Physical Performance : Berg Balance Test see below  Self Care: Risk of falls, use of cane or walker Navigating community with cane Barriers to good balance and gait stability     01/15/24 0001  Berg Balance Test  Standing Unsupported 4  Sitting with Back Unsupported but Feet Supported on Floor or Stool 4  Stand to Sit 4  Transfers 3  Standing Unsupported with Eyes Closed 3  Standing Unsupported with Feet Together 3  From Standing, Reach Forward with Outstretched Arm 2  From Standing Position, Pick up Object from Floor 4  From Standing Position, Turn to Look Behind Over each Shoulder 3  Turn 360 Degrees 2  Standing Unsupported, Alternately Place Feet on Step/Stool 1  Standing Unsupported, One Foot in Front 1  Standing on One Leg 0  38/56 high fall risk   [facility] Adult PT  Treatment:                                                DATE: 01/08/24 Therapeutic Exercise: Heel/ toe raises with cues to use UE support at counter top.  Standing hip abduction x 15 each  SLS with UE support x 10 sec each - diffulty holding RLE up Standing March  Sit to stand with UE rise, no UE to descend  Seated heel and toe raise Seated March  Updated HEP    [facility] Adult PT Treatment:                                                DATE: 01/03/24 Therapeutic Exercise: Developed, instructed in, and pt completed therex as noted in HEP   PATIENT EDUCATION:  Education details: Eval findings, POC, HEP, self care  Person educated: Patient Education method: Explanation, Demonstration, Tactile cues, Verbal cues, and Handouts Education comprehension: verbalized understanding, returned demonstration, verbal cues required, and tactile cues required  HOME EXERCISE PROGRAM: Access Code: [email or web address] URL: [email or web address] Date: 01/03/2024 Prepared by: Joellyn Rued  Exercises -  Sit to Stand with Counter Support  - 3 x daily - 7 x weekly - 1 sets - 10 reps - Heel Toe Raises with Counter Support  - 3 x daily - 7 x weekly - 1 sets - 10 reps - 2 hold - Standing Hip Abduction with Counter Support  - 3 x daily - 7 x weekly - 1 sets - 15 reps - 3 hold - Standing Single Leg Stance with Unilateral Counter Support  - 3 x daily - 7 x weekly - 1 sets - 10 reps - 20 hold 01/08/24:  Seated march   ASSESSMENT:  CLINICAL IMPRESSION: Beginning strength and balance measures completed so began strengthening and balance treatments for entire session to progress toward LTGs. She reports intermittent compliance with HEP. She has difficulty with static balance positions without UE and requires at least light touch for dynamic balance.  She will continue to benefit from skilled PT in order to improve mobility and stability with gait and balance     EVAL; Patient is a 68 y.o. female who was seen  today for physical therapy evaluation and treatment for  Z91.81 (ICD-10-CM) - History of recent fall  R26.9 (ICD-10-CM) - Gait disturbance  Pt presents with signs and symptoms consistent with referral. Pt was positive for weakness of legs, decreased proprioception, decreased balance, and decreased functional mobility. A HEP was started. Pt will benefit from skilled PT 2w8 to address impairments to optimize functional mobility.     OBJECTIVE IMPAIRMENTS: decreased activity tolerance, decreased balance, difficulty walking, decreased strength, increased edema, and pain.   ACTIVITY LIMITATIONS: carrying, lifting, bending, squatting, bathing, dressing, locomotion level, and caring for others  PARTICIPATION LIMITATIONS: meal prep, cleaning, laundry, shopping, and community activity  PERSONAL FACTORS: Fitness, Past/current experiences, Time since onset of injury/illness/exacerbation, and 1 comorbidity: DM neuropathy  are also affecting patient's functional outcome.   REHAB POTENTIAL: Good  CLINICAL DECISION MAKING: Evolving/moderate complexity  EVALUATION COMPLEXITY: Moderate   GOALS:  SHORT TERM GOALS: Target date: 01/25/24 Pt will be Ind in an initial HEP  Baseline: started Goal status: ongoing   2.  Improve 5xSTS by 5" and by MCID of 20ft as indication of improved functional mobility  Baseline: see eval Goal status: INITIAL  LONG TERM GOALS: Target date: 03/07/24  Pt will be Ind in a final HEP to maintain achieved LOF Baseline:  Goal status: INITIAL  2.  Increase LE strength by a 1/2 MMT grade for improved functional mobility  Baseline: see flow sheets Goal status: INITIAL  3.  Increase single leg balance to 5" or greater as indication of improved balance Baseline: 1" bilat  Goal status: INITIAL  4.  Improve 5xSTS by MCID of 10" and by MCID of 89ft as indication of improved functional mobility  Baseline: 26.3 and 227' respectively Goal status: INITIAL  5.  Pt's ABC  scale score to 67% or greater as indication of improved balance confidence Baseline: 51% Goal status: INITIAL  6.  Improve Berg by 5 points as indication of improved functional mobility  Baseline: 38/56  Goal status: INITIAL   PLAN:  PT FREQUENCY: 2x/week  PT DURATION: 8 weeks  PLANNED INTERVENTIONS: 97164- PT Re-evaluation, 97110-Therapeutic exercises, 97530- Therapeutic activity, 97112- Neuromuscular re-education, 97535- Self Care, 19147- Manual therapy, [phone]- Gait training, Patient/Family education, Balance training, and Stair training  PLAN FOR NEXT SESSION: Assess Berg; assess response to HEP; progress therex as indicated.   Karie Mainland, PT 01/17/24 10:33 AM Phone: [phone] Fax: [phone]

## 2024-01-21 NOTE — Therapy (Addendum)
 " OUTPATIENT PHYSICAL THERAPY LOWER EXTREMITY TREATMENT   Patient Name: Katherine [name] MRN: [id number] DOB:[birth year], 68 y.o., female Today's Date: 01/22/2024  END OF SESSION:  PT End of Session - 01/22/24 0943     Visit Number 5    Number of Visits 13    Date for PT Re-Evaluation 03/07/24    Authorization Type [employer] DUAL COMPLETE; MEDICAID OF [place]    PT Start Time 0936    PT Stop Time 1015    PT Time Calculation (min) 39 min    Activity Tolerance Patient tolerated treatment well    Behavior During Therapy [facility] for tasks assessed/performed               Past Medical History:  Diagnosis Date   Anemia    requiring transfusion   Boil of buttock ~ 2016   Depression    Diabetic neuropathy (HCC)    thelbert 08/23/2017   DKA (diabetic ketoacidoses)    recent/notes 01/06/2015   Exertional dyspnea 08/15/2023   Hyperlipidemia    Hypertension    Murmur 10/19/2022   Neuropathy    Peripheral arterial disease (HCC)    Peripheral vascular disease (HCC)    Toe ulcer (HCC)    left great toe/notes 08/23/2017   Type II diabetes mellitus (HCC)    Past Surgical History:  Procedure Laterality Date   ABDOMINAL AORTOGRAM W/LOWER EXTREMITY N/A 04/06/2022   Procedure: ABDOMINAL AORTOGRAM W/LOWER EXTREMITY;  Surgeon: Gretta Lonni PARAS, MD;  Location: [facility];  Service: Cardiovascular;  Laterality: N/A;   AMPUTATION TOE Left 10/26/2017   Procedure: AMPUTATION PARTIAL RAY LEFT FOOT AND IRRIGATION/DEBRIDEMENT;  Surgeon: Janit Thresa HERO, DPM;  Location: [facility] OR;  Service: Podiatry;  Laterality: Left;  AMPUTATION PARTIAL RAY LEFT FOOT AND IRRIGATION/DEBRIDEMENT   CHOLECYSTECTOMY  01/24/2003   thelbert 04/04/2011   COLONOSCOPY     INCISION AND DRAINAGE ABSCESS  03/17/2012   right but; left lower abdoment/notes 03/17/2012   LOWER EXTREMITY INTERVENTION N/A 09/10/2017   Procedure: LOWER EXTREMITY INTERVENTION;  Surgeon: Court Dorn PARAS, MD;  Location: [facility];  Service:  Cardiovascular;  Laterality: N/A;   PERIPHERAL VASCULAR ATHERECTOMY  09/10/2017   Procedure: PERIPHERAL VASCULAR ATHERECTOMY;  Surgeon: Court Dorn PARAS, MD;  [facility];  Service: Cardiovascular;;  left AT   PERIPHERAL VASCULAR BALLOON ANGIOPLASTY Left 04/06/2022   Procedure: PERIPHERAL VASCULAR BALLOON ANGIOPLASTY;  Surgeon: Gretta Lonni PARAS, MD;  Location: [facility];  Service: Cardiovascular;  Laterality: Left;   Patient Active Problem List   Diagnosis Date Noted   Exertional dyspnea 08/15/2023   Mild major depression (HCC) 11/24/2022   Murmur 10/19/2022   Medication monitoring encounter 04/10/2022   Cellulitis of left lower extremity    Neuropathy 12/08/2021   Diabetic gastroparesis associated with type 2 diabetes mellitus (HCC) 07/30/2021   Type 2 diabetes mellitus with diabetic peripheral angiopathy without gangrene, with long-term current use of insulin  (HCC) 07/30/2021   Amputation of left great toe (HCC) 06/13/2021   Microalbuminuria due to type 2 diabetes mellitus (HCC) 05/04/2021   Diabetes (HCC) 12/22/2019   Renal artery stenosis (HCC) 12/01/2019   Diabetic polyneuropathy associated with type 2 diabetes mellitus (HCC) 05/14/2018   Thyroid  nodule 10/04/2017   Microcytic anemia 09/11/2017   Retinopathy 08/22/2017   Thyroid  enlargement 07/12/2017   Dyslipidemia 09/20/2016   DEPRESSION 09/10/2009   LEG CRAMPS 05/19/2009   GERD 02/15/2009   Gastroparesis 09/03/2008   Dental caries 07/14/2008  Essential hypertension 09/16/2003    PCP: Vicci Barnie NOVAK, MD   REFERRING PROVIDER: Vicci Barnie NOVAK, MD   REFERRING DIAG:  [phone] (ICD-10-CM) - History of recent fall  R26.9 (ICD-10-CM) - Gait disturbance    THERAPY DIAG:  Difficulty in walking, not elsewhere classified  Muscle weakness (generalized)  Rationale for Evaluation and Treatment: Rehabilitation  ONSET DATE: 3 years  SUBJECTIVE:   SUBJECTIVE STATEMENT: Pt reports she  is completing her HEP daily and trying to walk more.  EVAL: Pt reports decrease steadiness with walking. She notes her legs feel heavy and she walks with shorter steps. Occasionally, she experiences shortness. Husband is in a [facility] and it provides support when she pushes him in the community. Pt notes she walks regularly within her home. Infrequently, she will use a SPC.  PERTINENT HISTORY: DM neuropathy Lt Big Toe amputation (2019) Vein procedures due to stenosis every couple of years   PAIN:  Are you having pain? Yes. 8/10.  Pain from knees to feet, coldness. DM neuropathy. R foot only 01/22/24  PRECAUTIONS: Fall  RED FLAGS: None   WEIGHT BEARING RESTRICTIONS: No  FALLS:  Has patient fallen in last 6 months? No  LIVING ENVIRONMENT: Lives with: lives with their family Lives in: House/apartment  N issue accessing home, has a ramp  OCCUPATION: Retired  PLOF: Independent  PATIENT GOALS: To walk better, be safe  NEXT MD VISIT: 05/01/24, Dr. Vicci  OBJECTIVE:  Note: Objective measures were completed at Evaluation unless otherwise noted.  DIAGNOSTIC FINDINGS: NA  PATIENT SURVEYS:  ABC scale 51%  COGNITION: Overall cognitive status: Within functional limits for tasks assessed     SENSATION: WFL  EDEMA:  Swelling of lower legs and feet  POSTURE: flexed trunk    LOWER EXTREMITY ROM: WFLs Active ROM Right eval Left eval  Hip flexion    Hip extension    Hip abduction    Hip adduction    Hip internal rotation    Hip external rotation    Knee flexion    Knee extension    Ankle dorsiflexion    Ankle plantarflexion    Ankle inversion    Ankle eversion     (Blank rows = not tested)  LOWER EXTREMITY MMT:  MMT Right eval Left eval  Hip flexion 3 3  Hip extension 2 2  Hip abduction 3 3  Hip adduction 3 3  Hip internal rotation 3 3  Hip external rotation 3 3  Knee flexion 4 4  Knee extension 4 4  Ankle dorsiflexion 3 3  Ankle plantarflexion 2 2  Ankle  inversion    Ankle eversion     (Blank rows = not tested)  FUNCTIONAL TESTS:  5 times sit to stand: 26.3 c use of hands 2 minute walk test: 227' Berg Balance Scale: TBA  - pre gait O2sat 98%, HR 76. Post gait o2sat 98%, HR 88. Pt denies shortness of breath   GAIT: Distance walked: 200'  Assistive device utilized: None Level of assistance: Complete Independence Comments: Decreased pace, LE weakness  TREATMENT DATE: [facility] Adult PT Treatment:                                                DATE: 01/22/24 Therapeutic Activity: Nustep L5 UE/LE x 7 minutes  Heel raises x 20  Alternating March Banding side steps BluTB Standing hip ext x10 BluTB STS x 10 added AIREX pad to chair- no UE   Neuro re-education:  Semi tandem stance  SLS with light touch required  Alternating step taps 2 finger touch   [facility] Adult PT Treatment:                                                DATE: 01/17/24 Therapeutic Activity: Nustep L5 UE/LE x 7 minutes  Heel raises x 20  Alternating March  Hip abduction 10 x 2 each  Side stepping in parallel bars , 2 passes each way STS x 10 standard chair- needs UE to rise STS x 10 added AIREX pad to chair- no UE   Neuro re-education:  Staggered and semi tandem stance  SLS with light touch required  Alternating step taps 2 finger touch  [facility] Adult PT Treatment:                                                DATE: 01/15/24 Therapeutic Activity: NuStep L5 UE and LE 7 min  Seated LAQ with ball 3 lbs  Seated PF with cuff wgts 3 lbs  Physical Performance : Berg Balance Test see below  Self Care: Risk of falls, use of cane or walker Navigating community with cane Barriers to good balance and gait stability     01/15/24 0001  Berg Balance Test  Standing Unsupported 4  Sitting with Back Unsupported but Feet Supported on Floor or Stool 4  Stand to Sit 4   Transfers 3  Standing Unsupported with Eyes Closed 3  Standing Unsupported with Feet Together 3  From Standing, Reach Forward with Outstretched Arm 2  From Standing Position, Pick up Object from Floor 4  From Standing Position, Turn to Look Behind Over each Shoulder 3  Turn 360 Degrees 2  Standing Unsupported, Alternately Place Feet on Step/Stool 1  Standing Unsupported, One Foot in Front 1  Standing on One Leg 0  38/56 high fall risk   [facility] Adult PT Treatment:                                                DATE: 01/08/24 Therapeutic Exercise: Heel/ toe raises with cues to use UE support at counter top.  Standing hip abduction x 15 each  SLS with UE support x 10 sec each - diffulty holding RLE up Standing March  Sit to stand with UE rise, no UE to descend  Seated heel and toe raise Seated March  Updated HEP  [facility] Adult PT Treatment:  DATE: 01/03/24 Therapeutic Exercise: Developed, instructed in, and pt completed therex as noted in HEP   PATIENT EDUCATION:  Education details: Eval findings, POC, HEP, self care  Person educated: Patient Education method: Explanation, Demonstration, Tactile cues, Verbal cues, and Handouts Education comprehension: verbalized understanding, returned demonstration, verbal cues required, and tactile cues required  HOME EXERCISE PROGRAM: Access Code: [email or web address] URL: [email or web address] Date: 01/03/2024 Prepared by: Dasie Daft  Exercises - Sit to Stand with Counter Support  - 3 x daily - 7 x weekly - 1 sets - 10 reps - Heel Toe Raises with Counter Support  - 3 x daily - 7 x weekly - 1 sets - 10 reps - 2 hold - Standing Hip Abduction with Counter Support  - 3 x daily - 7 x weekly - 1 sets - 15 reps - 3 hold - Standing Single Leg Stance with Unilateral Counter Support  - 3 x daily - 7 x weekly - 1 sets - 10 reps - 20 hold 01/08/24:  Seated march   ASSESSMENT:  CLINICAL IMPRESSION: PT  was completed for strength and balance in the [facility]. Verbal cueing was needed to complete STS with proper technique. Pt has a tendency not to get her UB weight over her feet. Pt needed hand assist with balance activities. Pt tolerated PT today without adverse effects. Will reassess the STGs the next PT session. She will continue to benefit from skilled PT in order to improve mobility and stability with gait and balance    EVAL; Patient is a 68 y.o. female who was seen today for physical therapy evaluation and treatment for  Z91.81 (ICD-10-CM) - History of recent fall  R26.9 (ICD-10-CM) - Gait disturbance  Pt presents with signs and symptoms consistent with referral. Pt was positive for weakness of legs, decreased proprioception, decreased balance, and decreased functional mobility. A HEP was started. Pt will benefit from skilled PT 2w8 to address impairments to optimize functional mobility.     OBJECTIVE IMPAIRMENTS: decreased activity tolerance, decreased balance, difficulty walking, decreased strength, increased edema, and pain.   ACTIVITY LIMITATIONS: carrying, lifting, bending, squatting, bathing, dressing, locomotion level, and caring for others  PARTICIPATION LIMITATIONS: meal prep, cleaning, laundry, shopping, and community activity  PERSONAL FACTORS: Fitness, Past/current experiences, Time since onset of injury/illness/exacerbation, and 1 comorbidity: DM neuropathy are also affecting patient's functional outcome.   REHAB POTENTIAL: Good  CLINICAL DECISION MAKING: Evolving/moderate complexity  EVALUATION COMPLEXITY: Moderate   GOALS:  SHORT TERM GOALS: Target date: 01/25/24 Pt will be Ind in an initial HEP  Baseline: started Goal status: ongoing   2.  Improve 5xSTS by 5 and by MCID of 57ft as indication of improved functional mobility  Baseline: see eval Goal status: INITIAL  LONG TERM GOALS: Target date: 03/07/24  Pt will be Ind in a final HEP to maintain achieved  LOF Baseline:  Goal status: INITIAL  2.  Increase LE strength by a 1/2 MMT grade for improved functional mobility  Baseline: see flow sheets Goal status: INITIAL  3.  Increase single leg balance to 5 or greater as indication of improved balance Baseline: 1 bilat  Goal status: INITIAL  4.  Improve 5xSTS by MCID of 10 and by MCID of 57ft as indication of improved functional mobility  Baseline: 26.3 and 227' respectively Goal status: INITIAL  5.  Pt's ABC scale score to 67% or greater as indication of improved balance confidence Baseline: 51% Goal status: INITIAL  6.  Improve Berg by 5 points  as indication of improved functional mobility  Baseline: 38/56  Goal status: INITIAL   PLAN:  PT FREQUENCY: 2x/week  PT DURATION: 8 weeks  PLANNED INTERVENTIONS: 97164- PT Re-evaluation, 97110-Therapeutic exercises, 97530- Therapeutic activity, 97112- Neuromuscular re-education, 97535- Self Care, [id number]- Manual therapy, [phone]- Gait training, Patient/Family education, Balance training, and Stair training  PLAN FOR NEXT SESSION: Assess Berg; assess response to HEP; progress therex as indicated.  Ellizabeth Dacruz MS, PT 01/22/24 10:17 AM  PHYSICAL THERAPY DISCHARGE SUMMARY  Visits from Start of Care: 5  Current functional level related to goals / functional outcomes: unknown   Remaining deficits: unknown   Education / Equipment: HEP/Pt Ed   Patient agrees to discharge. Patient goals were partially met. Patient is being discharged due to not returning since the last visit.  Alessandro Griep MS, PT 12/24/24 7:47 AM     "

## 2024-01-22 ENCOUNTER — Encounter: Payer: Self-pay | Admitting: "Endocrinology

## 2024-01-22 ENCOUNTER — Ambulatory Visit: Payer: 59 | Attending: Internal Medicine

## 2024-01-22 ENCOUNTER — Ambulatory Visit (INDEPENDENT_AMBULATORY_CARE_PROVIDER_SITE_OTHER): Admitting: "Endocrinology

## 2024-01-22 VITALS — BP 135/85 | HR 75 | Ht 72.0 in | Wt 222.4 lb

## 2024-01-22 DIAGNOSIS — Z7985 Long-term (current) use of injectable non-insulin antidiabetic drugs: Secondary | ICD-10-CM | POA: Diagnosis not present

## 2024-01-22 DIAGNOSIS — Z794 Long term (current) use of insulin: Secondary | ICD-10-CM | POA: Diagnosis not present

## 2024-01-22 DIAGNOSIS — M6281 Muscle weakness (generalized): Secondary | ICD-10-CM | POA: Diagnosis present

## 2024-01-22 DIAGNOSIS — R262 Difficulty in walking, not elsewhere classified: Secondary | ICD-10-CM | POA: Insufficient documentation

## 2024-01-22 DIAGNOSIS — E7800 Pure hypercholesterolemia, unspecified: Secondary | ICD-10-CM

## 2024-01-22 DIAGNOSIS — E1165 Type 2 diabetes mellitus with hyperglycemia: Secondary | ICD-10-CM | POA: Diagnosis not present

## 2024-01-22 NOTE — Progress Notes (Signed)
 Outpatient Endocrinology Note Katherine [place], MD  01/22/24   Katherine [name] 05-09-1956 [id number]  Referring Provider: Marcine Matar, MD Primary Care Provider: Marcine Matar, MD Reason for consultation: Subjective   Assessment & Plan  Diagnoses and all orders for this visit:  Uncontrolled type 2 diabetes mellitus with hyperglycemia (HCC)  Long-term (current) use of injectable non-insulin antidiabetic drugs  Long-term insulin use (HCC)  Pure hypercholesterolemia    Diabetes Type II complicated by neuropathy, No results found for: "GFR" Hba1c goal less than 7, current Hba1c is  Lab Results  Component Value Date   HGBA1C 10.6 (H) 12/27/2023   Will recommend the following: Toujeo 80 units every day Novolog 30 untis tidac Ozempic 0.25 mg/week Cannot adjust meds without log, patient will start libre 3 today Use Skin tac for better sticking   No known contraindications/side effects to any of above medications Glucagon discussed and prescribed with refills on 01/22/24  -Last LD and Tg are as follows: Lab Results  Component Value Date   LDLCALC 44 12/27/2023    Lab Results  Component Value Date   TRIG 99 12/27/2023   -On atorvastatin 80 mg QD -Follow low fat diet and exercise   -Blood pressure goal <140/90 - Microalbumin/creatinine goal is < 30 -Last MA/Cr is as follows: Lab Results  Component Value Date   MICROALBUR 2.7 12/27/2023   -on ACE/ARB  -diet changes including salt restriction -limit eating outside -counseled BP targets per standards of diabetes care -uncontrolled blood pressure can lead to retinopathy, nephropathy and cardiovascular and atherosclerotic heart disease  Reviewed and counseled on: -A1C target -Blood sugar targets -Complications of uncontrolled diabetes  -Checking blood sugar before meals and bedtime and bring log next visit -All medications with mechanism of action and side effects -Hypoglycemia management: rule of  15's, Glucagon Emergency Kit and medical alert ID -low-carb low-fat plate-method diet -At least 20 minutes of physical activity per day -Annual dilated retinal eye exam and foot exam -compliance and follow up needs -follow up as scheduled or earlier if problem gets worse  Call if blood sugar is less than 70 or consistently above 250    Take a 15 gm snack of carbohydrate at bedtime before you go to sleep if your blood sugar is less than 100.    If you are going to fast after midnight for a test or procedure, ask your physician for instructions on how to reduce/decrease your insulin dose.    Call if blood sugar is less than 70 or consistently above 250  -Treating a low sugar by rule of 15  (15 gms of sugar every 15 min until sugar is more than 70) If you feel your sugar is low, test your sugar to be sure If your sugar is low (less than 70), then take 15 grams of a fast acting Carbohydrate (3-4 glucose tablets or glucose gel or 4 ounces of juice or regular soda) Recheck your sugar 15 min after treating low to make sure it is more than 70 If sugar is still less than 70, treat again with 15 grams of carbohydrate          Don't drive the hour of hypoglycemia  If unconscious/unable to eat or drink by mouth, use glucagon injection or nasal spray baqsimi and call 911. Can repeat again in 15 min if still unconscious.  Return in about 2 weeks (around 02/05/2024).   I have reviewed current medications, nurse's notes, allergies, vital signs, past medical  and surgical history, family medical history, and social history for this encounter. Counseled patient on symptoms, examination findings, lab findings, imaging results, treatment decisions and monitoring and prognosis. The patient understood the recommendations and agrees with the treatment plan. All questions regarding treatment plan were fully answered.  Katherine [place], MD  01/22/24    History of Present Illness Katherine [name] is a 68 y.o. year  old female who presents for evaluation of Type II diabetes mellitus.  Katherine [name] was first diagnosed in 2011.   Diabetes education +  Home diabetes regimen: Toujeo 80 units every day Novolog 30 untis tidac Ozempic 0.25 mg/week  COMPLICATIONS -  MI/Stroke -  retinopathy +  neuropathy -  nephropathy  BLOOD SUGAR DATA Checks sparingly Did not bring meter   Physical Exam  BP 135/85 (BP Location: Left Arm, Patient Position: Sitting)   Pulse 75   Ht 6' (1.829 m)   Wt 222 lb 6.4 oz (100.9 kg)   SpO2 98%   BMI 30.16 kg/m    Constitutional: well developed, well nourished Head: normocephalic, atraumatic Eyes: sclera anicteric, no redness Neck: supple Lungs: normal respiratory effort Neurology: alert and oriented Skin: dry, no appreciable rashes Musculoskeletal: no appreciable defects Psychiatric: normal mood and affect Diabetic Foot Exam - Simple   No data filed      Current Medications Patient's Medications  New Prescriptions   No medications on file  Previous Medications   ATORVASTATIN (LIPITOR) 80 MG TABLET    TAKE 1 TABLET BY MOUTH DAILY AT 6 PM.   BAQSIMI ONE PACK 3 MG/DOSE POWD    Place 1 spray into the nose as needed (Low blood sugar).   BLOOD PRESSURE KIT    Check blood pressure twice a day Dx: I10, hypertension   CARBOXYMETHYLCELLULOSE SODIUM (EYE DROPS OP)    Place 1 drop into both eyes daily.   CARVEDILOL (COREG) 6.25 MG TABLET    TAKE 1 TABLET BY MOUTH 2 TIMES DAILY WITH A MEAL.   CLOPIDOGREL (PLAVIX) 75 MG TABLET    TAKE 1 TABLET BY MOUTH EVERY DAY WITH BREAKFAST   CONTINUOUS BLOOD GLUC RECEIVER (FREESTYLE LIBRE 2 READER) DEVI    Use as directed 3 times daily   CONTINUOUS BLOOD GLUC RECEIVER (FREESTYLE LIBRE 2 READER) DEVI    Scan as needed for continuous glucose monitoring.   CONTINUOUS BLOOD GLUC SENSOR (FREESTYLE LIBRE 2 SENSOR) MISC    Change sensor every 2 weeks.   CONTINUOUS BLOOD GLUC SENSOR (FREESTYLE LIBRE 2 SENSOR) MISC    Inject 1 sensor to  the skin every 14 days for continuous glucose monitoring.   GABAPENTIN (NEURONTIN) 600 MG TABLET    Take 1 tablet (600 mg total) by mouth 2 (two) times daily.   GLUCOSE BLOOD (ONETOUCH VERIO) TEST STRIP    1 each by Misc.(Non-Drug; Combo Route) route 4 times daily before meals and nightly.   HYDRALAZINE (APRESOLINE) 25 MG TABLET    Take 1 tablet (25 mg total) by mouth 3 (three) times daily.   INSULIN ASPART (NOVOLOG) 100 UNIT/ML FLEXPEN    Inject 10-17 units into the skin 3 times daily before meals.   LANCETS (ONETOUCH DELICA PLUS LANCET33G) MISC    Use 4 times daily before meals and nightly.   LANCETS MISC    Please dispense the Single/disposable Lancets.  Check BS 4x/day   MULTIPLE VITAMINS-MINERALS (MULTIVITAMIN ADULT) CHEW    Chew 2 tablets by mouth daily.   MUPIROCIN CREAM (BACTROBAN) 2 %  Apply topically daily. Apply to left foot wound and cover with dry dressing once daily   OLMESARTAN-AMLODIPINE-HCTZ 40-10-25 MG TABS    Take 1 tablet by mouth daily.   RESTASIS 0.05 % OPHTHALMIC EMULSION    Place 1 drop into both eyes 2 (two) times daily.   TOUJEO SOLOSTAR 300 UNIT/ML SOLOSTAR PEN    Inject 70 Units into the skin daily.   TRAMADOL (ULTRAM) 50 MG TABLET    Take 1 tablet (50 mg total) by mouth every 12 (twelve) hours as needed.   TRUEPLUS PEN NEEDLES 31G X 5 MM MISC    Use to inject Toujeo once daily.   VITAMIN D PO    Take 2 tablets by mouth at bedtime.  Modified Medications   No medications on file  Discontinued Medications   SEMAGLUTIDE, 1 MG/DOSE, (OZEMPIC, 1 MG/DOSE,) 4 MG/3ML SOPN    Inject 1 mg into the skin once a week.    Allergies Allergies  Allergen Reactions   Other Swelling    Seaweed= swelling on arms, hands and face   Metformin And Related Nausea Only    Past Medical History Past Medical History:  Diagnosis Date   Anemia    requiring transfusion   Boil of buttock ~ 2016   Depression    Diabetic neuropathy (HCC)    [name] 08/23/2017   DKA (diabetic  ketoacidoses)    recent/notes 01/06/2015   Exertional dyspnea 08/15/2023   Hyperlipidemia    Hypertension    Murmur 10/19/2022   Neuropathy    Peripheral arterial disease (HCC)    Peripheral vascular disease (HCC)    Toe ulcer (HCC)    left great toe/notes 08/23/2017   Type II diabetes mellitus (HCC)     Past Surgical History Past Surgical History:  Procedure Laterality Date   ABDOMINAL AORTOGRAM W/LOWER EXTREMITY N/A 04/06/2022   Procedure: ABDOMINAL AORTOGRAM W/LOWER EXTREMITY;  Surgeon: Cephus Shelling, MD;  Location: [facility];  Service: Cardiovascular;  Laterality: N/A;   AMPUTATION TOE Left 10/26/2017   Procedure: AMPUTATION PARTIAL RAY LEFT FOOT AND IRRIGATION/DEBRIDEMENT;  Surgeon: Felecia Shelling, DPM;  Location: [facility] OR;  Service: Podiatry;  Laterality: Left;  AMPUTATION PARTIAL RAY LEFT FOOT AND IRRIGATION/DEBRIDEMENT   CHOLECYSTECTOMY  01/24/2003   [name] 04/04/2011   COLONOSCOPY     INCISION AND DRAINAGE ABSCESS  03/17/2012   "right but; left lower abdoment"/notes 03/17/2012   LOWER EXTREMITY INTERVENTION N/A 09/10/2017   Procedure: LOWER EXTREMITY INTERVENTION;  Surgeon: Runell Gess, MD;  Location: [facility];  Service: Cardiovascular;  Laterality: N/A;   PERIPHERAL VASCULAR ATHERECTOMY  09/10/2017   Procedure: PERIPHERAL VASCULAR ATHERECTOMY;  Surgeon: Runell Gess, MD;  Location: [facility];  Service: Cardiovascular;;  left AT   PERIPHERAL VASCULAR BALLOON ANGIOPLASTY Left 04/06/2022   Procedure: PERIPHERAL VASCULAR BALLOON ANGIOPLASTY;  Surgeon: Cephus Shelling, MD;  Location: [facility];  Service: Cardiovascular;  Laterality: Left;    Family History family history includes Cancer in her maternal grandmother; Diabetes in her father, paternal aunt, paternal grandfather, and paternal uncle; Hypertension in her father, maternal grandfather, mother, paternal aunt, and paternal uncle; Kidney disease in her mother.  Social  History Social History   Socioeconomic History   Marital status: Married    Spouse name: Not on file   Number of children: Not on file   Years of education: Not on file   Highest education level: Not on file  Occupational History  Not on file  Tobacco Use   Smoking status: Never    Passive exposure: Never   Smokeless tobacco: Never  Vaping Use   Vaping status: Never Used  Substance and Sexual Activity   Alcohol use: No   Drug use: No   Sexual activity: Never  Other Topics Concern   Not on file  Social History Narrative   Not on file   Social Drivers of Health   Financial Resource Strain: Low Risk  (10/05/2022)   Overall Financial Resource Strain (CARDIA)    Difficulty of Paying Living Expenses: Not hard at all  Food Insecurity: No Food Insecurity (09/24/2023)   Hunger Vital Sign    Worried About Running Out of Food in the Last Year: Never true    Ran Out of Food in the Last Year: Never true  Transportation Needs: No Transportation Needs (10/05/2022)   PRAPARE - Administrator, Civil Service (Medical): No    Lack of Transportation (Non-Medical): No  Physical Activity: Inactive (10/05/2022)   Exercise Vital Sign    Days of Exercise per Week: 0 days    Minutes of Exercise per Session: 0 min  Stress: No Stress Concern Present (10/05/2022)   [employer] of Occupational Health - Occupational Stress Questionnaire    Feeling of Stress : Only a little  Social Connections: Socially Integrated (12/18/2023)   Social Connection and Isolation Panel [NHANES]    Frequency of Communication with Friends and Family: Twice a week    Frequency of Social Gatherings with Friends and Family: Twice a week    Attends Religious Services: More than 4 times per year    Active Member of Clubs or Organizations: Yes    Attends Banker Meetings: More than 4 times per year    Marital Status: Married  Catering manager Violence: Not At Risk (10/05/2022)   Humiliation,  Afraid, Rape, and Kick questionnaire    Fear of Current or Ex-Partner: No    Emotionally Abused: No    Physically Abused: No    Sexually Abused: No    Lab Results  Component Value Date   HGBA1C 10.6 (H) 12/27/2023   HGBA1C 10.7 (A) 12/18/2023   HGBA1C 10.5 (A) 09/14/2023   Lab Results  Component Value Date   CHOL 115 12/27/2023   Lab Results  Component Value Date   HDL 53 12/27/2023   Lab Results  Component Value Date   LDLCALC 44 12/27/2023   Lab Results  Component Value Date   TRIG 99 12/27/2023   Lab Results  Component Value Date   CHOLHDL 2.2 12/27/2023   Lab Results  Component Value Date   CREATININE 0.96 12/27/2023   No results found for: "GFR" Lab Results  Component Value Date   MICROALBUR 2.7 12/27/2023      Component Value Date/Time   NA 138 12/27/2023 0938   NA 143 12/18/2023 1002   K 4.2 12/27/2023 0938   CL 99 12/27/2023 0938   CO2 33 (H) 12/27/2023 0938   GLUCOSE 228 (H) 12/27/2023 0938   BUN 26 (H) 12/27/2023 0938   BUN 15 12/18/2023 1002   CREATININE 0.96 12/27/2023 0938   CALCIUM 9.4 12/27/2023 0938   PROT 6.7 12/27/2023 0938   PROT 7.0 12/18/2023 1002   ALBUMIN 4.1 12/18/2023 1002   AST 13 12/27/2023 0938   ALT 11 12/27/2023 0938   ALKPHOS 164 (H) 12/18/2023 1002   BILITOT 0.6 12/27/2023 0938   BILITOT 0.4 12/18/2023 1002  GFRNONAA >60 04/01/2022 0132   GFRNONAA >89 01/06/2015 1621   GFRAA 73 12/01/2019 1119   GFRAA >89 01/06/2015 1621      Latest Ref Rng & Units 12/27/2023    9:38 AM 12/18/2023   10:02 AM 06/18/2023    1:03 PM  BMP  Glucose 65 - 99 mg/dL 161  096  045   BUN 7 - 25 mg/dL 26  15  15    Creatinine 0.50 - 1.05 mg/dL 4.09  8.11  9.14   BUN/Creat Ratio 6 - 22 (calc) 27  18  17    Sodium 135 - 146 mmol/L 138  143  139   Potassium 3.5 - 5.3 mmol/L 4.2  4.1  4.3   Chloride 98 - 110 mmol/L 99  101  97   CO2 20 - 32 mmol/L 33  30  31   Calcium 8.6 - 10.4 mg/dL 9.4  9.3  9.4        Component Value Date/Time   WBC  4.6 08/16/2023 1015   WBC 4.6 04/24/2022 1152   RBC 4.81 08/16/2023 1015   RBC 4.29 04/24/2022 1152   HGB 13.7 08/16/2023 1015   HCT 43.2 08/16/2023 1015   PLT 230 08/16/2023 1015   MCV 90 08/16/2023 1015   MCH 28.5 08/16/2023 1015   MCH 28.0 04/24/2022 1152   MCHC 31.7 08/16/2023 1015   MCHC 32.2 04/24/2022 1152   RDW 12.0 08/16/2023 1015   LYMPHSABS 1.9 08/16/2023 1015   MONOABS 0.7 04/01/2022 0132   EOSABS 0.2 08/16/2023 1015   BASOSABS 0.0 08/16/2023 1015     Parts of this note may have been dictated using voice recognition software. There may be variances in spelling and vocabulary which are unintentional. Not all errors are proofread. Please notify the Thereasa Parkin if any discrepancies are noted or if the meaning of any statement is not clear.

## 2024-01-22 NOTE — Patient Instructions (Signed)

## 2024-01-24 ENCOUNTER — Ambulatory Visit: Payer: 59

## 2024-01-24 DIAGNOSIS — M2142 Flat foot [pes planus] (acquired), left foot: Secondary | ICD-10-CM | POA: Diagnosis not present

## 2024-01-24 DIAGNOSIS — M2141 Flat foot [pes planus] (acquired), right foot: Secondary | ICD-10-CM | POA: Diagnosis not present

## 2024-01-24 DIAGNOSIS — E11628 Type 2 diabetes mellitus with other skin complications: Secondary | ICD-10-CM | POA: Diagnosis not present

## 2024-01-24 DIAGNOSIS — S98112A Complete traumatic amputation of left great toe, initial encounter: Secondary | ICD-10-CM

## 2024-01-24 DIAGNOSIS — L089 Local infection of the skin and subcutaneous tissue, unspecified: Secondary | ICD-10-CM

## 2024-01-24 DIAGNOSIS — L02619 Cutaneous abscess of unspecified foot: Secondary | ICD-10-CM

## 2024-01-24 NOTE — Progress Notes (Signed)

## 2024-01-27 ENCOUNTER — Encounter (HOSPITAL_COMMUNITY): Payer: Self-pay

## 2024-01-27 ENCOUNTER — Ambulatory Visit (HOSPITAL_COMMUNITY)
Admission: EM | Admit: 2024-01-27 | Discharge: 2024-01-27 | Disposition: A | Discharge status: Home / Self Care | Attending: Emergency Medicine | Admitting: Emergency Medicine

## 2024-01-27 DIAGNOSIS — Z20822 Contact with and (suspected) exposure to covid-19: Secondary | ICD-10-CM | POA: Diagnosis not present

## 2024-01-27 NOTE — Discharge Instructions (Signed)
We will call you if your covid test returns positive.

## 2024-01-27 NOTE — ED Triage Notes (Signed)
 Patient was exposed to Covid. Patieant's husband was diagnosed with covid last night. Patient states she has sinus problems.

## 2024-01-27 NOTE — ED Provider Notes (Signed)
 [facility]    CSN: [id number] Arrival date & time: 01/27/24  1507     History   Chief Complaint Chief Complaint  Patient presents with   Covid Exposure    HPI Katherine [name] is a 68 y.o. female.  Patient here for a covid test Reports her husband tested positive for covid last night She is not having any symptoms Her PCP wanted her to get tested  Past Medical History:  Diagnosis Date   Anemia    requiring transfusion   Boil of buttock ~ 2016   Depression    Diabetic neuropathy (HCC)    [name] 08/23/2017   DKA (diabetic ketoacidoses)    recent/notes 01/06/2015   Exertional dyspnea 08/15/2023   Hyperlipidemia    Hypertension    Murmur 10/19/2022   Neuropathy    Peripheral arterial disease (HCC)    Peripheral vascular disease (HCC)    Toe ulcer (HCC)    left great toe/notes 08/23/2017   Type II diabetes mellitus (HCC)     Patient Active Problem List   Diagnosis Date Noted   Exertional dyspnea 08/15/2023   Mild major depression (HCC) 11/24/2022   Murmur 10/19/2022   Medication monitoring encounter 04/10/2022   Cellulitis of left lower extremity    Neuropathy 12/08/2021   Diabetic gastroparesis associated with type 2 diabetes mellitus (HCC) 07/30/2021   Type 2 diabetes mellitus with diabetic peripheral angiopathy without gangrene, with long-term current use of insulin (HCC) 07/30/2021   Amputation of left great toe (HCC) 06/13/2021   Microalbuminuria due to type 2 diabetes mellitus (HCC) 05/04/2021   Diabetes (HCC) 12/22/2019   Renal artery stenosis (HCC) 12/01/2019   Diabetic polyneuropathy associated with type 2 diabetes mellitus (HCC) 05/14/2018   Thyroid nodule 10/04/2017   Microcytic anemia 09/11/2017   Retinopathy 08/22/2017   Thyroid enlargement 07/12/2017   Dyslipidemia 09/20/2016   DEPRESSION 09/10/2009   LEG CRAMPS 05/19/2009   GERD 02/15/2009   Gastroparesis 09/03/2008   Dental caries 07/14/2008   Essential hypertension 09/16/2003     Past Surgical History:  Procedure Laterality Date   ABDOMINAL AORTOGRAM W/LOWER EXTREMITY N/A 04/06/2022   Procedure: ABDOMINAL AORTOGRAM W/LOWER EXTREMITY;  Surgeon: Cephus Shelling, MD;  [facility];  Service: Cardiovascular;  Laterality: N/A;   AMPUTATION TOE Left 10/26/2017   Procedure: AMPUTATION PARTIAL RAY LEFT FOOT AND IRRIGATION/DEBRIDEMENT;  Surgeon: Felecia Shelling, DPM;  Location: [facility];  Service: Podiatry;  Laterality: Left;  AMPUTATION PARTIAL RAY LEFT FOOT AND IRRIGATION/DEBRIDEMENT   CHOLECYSTECTOMY  01/24/2003   [name] 04/04/2011   COLONOSCOPY     INCISION AND DRAINAGE ABSCESS  03/17/2012   "right but; left lower abdoment"/notes 03/17/2012   LOWER EXTREMITY INTERVENTION N/A 09/10/2017   Procedure: LOWER EXTREMITY INTERVENTION;  Surgeon: Runell Gess, MD;  [facility];  Service: Cardiovascular;  Laterality: N/A;   PERIPHERAL VASCULAR ATHERECTOMY  09/10/2017   Procedure: PERIPHERAL VASCULAR ATHERECTOMY;  Surgeon: Runell Gess, MD;  Location: [facility];  Service: Cardiovascular;;  left AT   PERIPHERAL VASCULAR BALLOON ANGIOPLASTY Left 04/06/2022   Procedure: PERIPHERAL VASCULAR BALLOON ANGIOPLASTY;  Surgeon: Cephus Shelling, MD;  [facility];  Service: Cardiovascular;  Laterality: Left;    OB History   No obstetric history on file.      Home Medications    Prior to Admission medications   Medication Sig Start Date End Date Taking? Authorizing Provider  atorvastatin (LIPITOR) 80 MG tablet  TAKE 1 TABLET BY MOUTH DAILY AT 6 PM. 12/07/23   Marcine Matar, MD  BAQSIMI ONE PACK 3 MG/DOSE POWD Place 1 spray into the nose as needed (Low blood sugar). 03/14/22   [provider]  Blood Pressure KIT Check blood pressure twice a day Dx: I10, hypertension 11/23/22   Chilton Si, MD  Carboxymethylcellulose Sodium (EYE DROPS OP) Place 1 drop into both eyes daily.    [provider]   carvedilol (COREG) 6.25 MG tablet TAKE 1 TABLET BY MOUTH 2 TIMES DAILY WITH A MEAL. 12/07/23   Marcine Matar, MD  clopidogrel (PLAVIX) 75 MG tablet TAKE 1 TABLET BY MOUTH EVERY DAY WITH BREAKFAST Patient taking differently: Take 75 mg by mouth at bedtime. 07/03/22   Runell Gess, MD  Continuous Blood Gluc Receiver (FREESTYLE LIBRE 2 READER) DEVI Use as directed 3 times daily Patient not taking: Reported on 01/22/2024 05/26/21   Marcine Matar, MD  Continuous Blood Gluc Receiver (FREESTYLE LIBRE 2 READER) DEVI Scan as needed for continuous glucose monitoring. Patient not taking: Reported on 01/22/2024 08/22/22   Marcine Matar, MD  Continuous Blood Gluc Sensor (FREESTYLE LIBRE 2 SENSOR) MISC Change sensor every 2 weeks. Patient not taking: Reported on 01/22/2024 05/26/21   Marcine Matar, MD  Continuous Blood Gluc Sensor (FREESTYLE LIBRE 2 SENSOR) MISC Inject 1 sensor to the skin every 14 days for continuous glucose monitoring. Patient not taking: Reported on 01/22/2024 08/22/22   Marcine Matar, MD  gabapentin (NEURONTIN) 600 MG tablet Take 1 tablet (600 mg total) by mouth 2 (two) times daily. Patient taking differently: Take 650 mg by mouth 2 (two) times daily. 05/03/21   Marcine Matar, MD  glucose blood (ONETOUCH VERIO) test strip 1 each by Misc.(Non-Drug; Combo Route) route 4 times daily before meals and nightly. 10/17/23   Marcine Matar, MD  hydrALAZINE (APRESOLINE) 25 MG tablet Take 1 tablet (25 mg total) by mouth 3 (three) times daily. 12/18/23   Marcine Matar, MD  insulin aspart (NOVOLOG) 100 UNIT/ML FlexPen Inject 10-17 units into the skin 3 times daily before meals. Patient taking differently: Inject 10 Units into the skin 3 (three) times daily with meals. 09/09/21     Lancets (ONETOUCH DELICA PLUS LANCET33G) MISC Use 4 times daily before meals and nightly. 09/09/21     Lancets MISC Please dispense the Single/disposable Lancets.  Check BS 4x/day 09/14/23   Marcine Matar, MD  Multiple Vitamins-Minerals (MULTIVITAMIN ADULT) CHEW Chew 2 tablets by mouth daily.    [provider]  mupirocin cream (BACTROBAN) 2 % Apply topically daily. Apply to left foot wound and cover with dry dressing once daily 02/22/23   Emilie Rutter, PA-C  Olmesartan-amLODIPine-HCTZ 40-10-25 MG TABS Take 1 tablet by mouth daily. 08/14/23   Chilton Si, MD  RESTASIS 0.05 % ophthalmic emulsion Place 1 drop into both eyes 2 (two) times daily. 05/14/23   [provider]  TOUJEO SOLOSTAR 300 UNIT/ML Solostar Pen Inject 70 Units into the skin daily. 06/08/23   Marcine Matar, MD  traMADol (ULTRAM) 50 MG tablet Take 1 tablet (50 mg total) by mouth every 12 (twelve) hours as needed. 10/19/22   Chilton Si, MD  TRUEPLUS PEN NEEDLES 31G X 5 MM MISC Use to inject Toujeo once daily. 12/22/22   Marcine Matar, MD  VITAMIN D PO Take 2 tablets by mouth at bedtime.    [provider]    Family History Family  History  Problem Relation Age of Onset   Kidney disease Mother    Hypertension Mother    Diabetes Father    Hypertension Father    Hypertension Paternal Aunt    Diabetes Paternal Aunt    Hypertension Paternal Uncle    Diabetes Paternal Uncle    Cancer Maternal Grandmother    Hypertension Maternal Grandfather    Diabetes Paternal Grandfather    Colon cancer Neg Hx    Breast cancer Neg Hx     Social History Social History   Tobacco Use   Smoking status: Never    Passive exposure: Never   Smokeless tobacco: Never  Vaping Use   Vaping status: Never Used  Substance Use Topics   Alcohol use: No   Drug use: No     Allergies   Other and Metformin and related   Review of Systems Review of Systems Per HPI  Physical Exam Triage Vital Signs ED Triage Vitals [01/27/24 1629]  Encounter Vitals Group     BP (!) 156/69     Systolic BP Percentile      Diastolic BP Percentile      Pulse Rate 67     Resp 16     Temp 98.2 F (36.8  C)     Temp Source Oral     SpO2 94 %     Weight      Height      Head Circumference      Peak Flow      Pain Score 0     Pain Loc      Pain Education      Exclude from Growth Chart    No data found.  Updated Vital Signs BP (!) 156/69 (BP Location: Left Arm)   Pulse 67   Temp 98.2 F (36.8 C) (Oral)   Resp 16   SpO2 94%   Visual Acuity Right Eye Distance:   Left Eye Distance:   Bilateral Distance:    Right Eye Near:   Left Eye Near:    Bilateral Near:     Physical Exam Vitals and nursing note reviewed.  Constitutional:      General: She is not in acute distress.    Appearance: Normal appearance.  HENT:     Mouth/Throat:     Mouth: Mucous membranes are moist.     Pharynx: Oropharynx is clear.  Cardiovascular:     Rate and Rhythm: Normal rate and regular rhythm.     Pulses: Normal pulses.     Heart sounds: Normal heart sounds.  Pulmonary:     Effort: Pulmonary effort is normal.     Breath sounds: Normal breath sounds.  Neurological:     Mental Status: She is alert and oriented to person, place, and time.     UC Treatments / Results  Labs (all labs ordered are listed, but only abnormal results are displayed) Labs Reviewed  SARS CORONAVIRUS 2 (TAT 6-24 HRS)    EKG   Radiology No results found.  Procedures Procedures (including critical care time)  Medications Ordered in UC Medications - No data to display  Initial Impression / Assessment and Plan / UC Course  I have reviewed the triage vital signs and the nursing notes.  Pertinent labs & imaging results that were available during my care of the patient were reviewed by me and considered in my medical decision making (see chart for details).  Covid test pending No symptoms. No antivirals if positive.  Advised  symptomatic care if develops any symptoms Patient agrees to plan, no questions   Final Clinical Impressions(s) / UC Diagnoses   Final diagnoses:  Close exposure to COVID-19 virus      Discharge Instructions      We will call you if your covid test returns positive.      ED Prescriptions   None    PDMP not reviewed this encounter.   Juwann Sherk, Lurena Joiner, [place] 01/27/24 1706

## 2024-01-28 LAB — SARS CORONAVIRUS 2 (TAT 6-24 HRS): SARS Coronavirus 2: POSITIVE — AB

## 2024-01-29 ENCOUNTER — Ambulatory Visit: Payer: 59

## 2024-01-31 ENCOUNTER — Ambulatory Visit: Payer: 59

## 2024-02-04 ENCOUNTER — Telehealth: Payer: Self-pay

## 2024-02-04 NOTE — Telephone Encounter (Signed)
 Give pt appt with any available provider.

## 2024-02-04 NOTE — Telephone Encounter (Signed)
 Patient seen at hospital on 01/27/2024   Copied from CRM #[id number]. Topic: Clinical - Prescription Issue >> Feb 04, 2024 10:07 AM Eunice Blase wrote: Reason for CRM: Pt called is not getting better. Pt is requesting an antibiotic sent to [id number], [place] - [address] [place] [address]. Please call pt at [phone].

## 2024-02-05 NOTE — Telephone Encounter (Signed)
Called but no answer. Unable to LVM due to VM being full.

## 2024-02-06 NOTE — Telephone Encounter (Signed)
 Called & spoke to the patient.. Verified name & DOB. Scheduled for next available appointment on 02/11/2024. Patient conformed appointment.

## 2024-02-11 ENCOUNTER — Encounter: Payer: Self-pay | Admitting: Internal Medicine

## 2024-02-11 ENCOUNTER — Ambulatory Visit: Attending: Internal Medicine | Admitting: Internal Medicine

## 2024-02-11 VITALS — BP 147/72 | HR 65 | Temp 97.6°F | Ht 72.0 in | Wt 211.0 lb

## 2024-02-11 DIAGNOSIS — E538 Deficiency of other specified B group vitamins: Secondary | ICD-10-CM

## 2024-02-11 DIAGNOSIS — I10 Essential (primary) hypertension: Secondary | ICD-10-CM | POA: Diagnosis not present

## 2024-02-11 DIAGNOSIS — J069 Acute upper respiratory infection, unspecified: Secondary | ICD-10-CM

## 2024-02-11 MED ORDER — LORATADINE 10 MG PO TABS: 10.0000 mg | ORAL_TABLET | Freq: Every day | ORAL | 0 refills | Status: DC

## 2024-02-11 MED ORDER — FLUTICASONE PROPIONATE 50 MCG/ACT NA SUSP: NASAL | 0 refills | Status: DC

## 2024-02-11 NOTE — Progress Notes (Signed)
 Patient ID: Katherine [name], female    DOB: 1956-07-21  MRN: [id number]  CC: Sinus Problem (Congestion, runny nose, cough X1 mo/Requesting disposable single use lancets /Already received flu vax)   Subjective: Katherine [name] is a 68 y.o. female who presents for acute visit Her concerns today include:  Hx of HTN, DM with neuropathy, retinopathy and possible gastroparesis, HL, depression, GERD, PAD ( left anterior tibial angioplasty 03/2022 for left fifth toe osteomyelitis), s/p amputation LT 1st toe, BL RAS 59%.    Discussed the use of AI scribe software for clinical note transcription with the patient, who gave verbal consent to proceed.  History of Present Illness Katherine [name] is a 68 year old female with diabetes and hypertension who presents with congestion, runny nose, and cough for the past month.  She has been experiencing congestion, runny nose, and cough for the past month. Initially, she sought care at an urgent care center on 01/27/24 due to COVID exposure from her husband.  This test was positive. She attributes her exposure to her husband, who was hospitalized with pneumonia. Her cough is primarily nocturnal, causing discomfort in her throat and chest, making it difficult for her to lie down. She uses three thick pillows to sleep more comfortably. No phlegm production is associated with the cough. She reports a runny nose, predominantly on the right side, but denies any sinus pain, headaches, sneezing, fever, or itchy throat. She notes a sensation of throat tightness that began today.  HTN: Her blood pressure was elevated today at 157/73, but she had not yet taken her medications due to nausea when taking them on an empty stomach.  She reports a decreased appetite and fluid intake over the past week, consuming only one 16-ounce glass of water daily, down from her usual six 32-ounce glasses.  She recalls a previous B12 deficiency and questions whether taking a B12 vitamin might help  with her energy levels.   Patient Active Problem List   Diagnosis Date Noted   Exertional dyspnea 08/15/2023   Mild major depression (HCC) 11/24/2022   Murmur 10/19/2022   Medication monitoring encounter 04/10/2022   Cellulitis of left lower extremity    Neuropathy 12/08/2021   Diabetic gastroparesis associated with type 2 diabetes mellitus (HCC) 07/30/2021   Type 2 diabetes mellitus with diabetic peripheral angiopathy without gangrene, with long-term current use of insulin (HCC) 07/30/2021   Amputation of left great toe (HCC) 06/13/2021   Microalbuminuria due to type 2 diabetes mellitus (HCC) 05/04/2021   Diabetes (HCC) 12/22/2019   Renal artery stenosis (HCC) 12/01/2019   Diabetic polyneuropathy associated with type 2 diabetes mellitus (HCC) 05/14/2018   Thyroid nodule 10/04/2017   Microcytic anemia 09/11/2017   Retinopathy 08/22/2017   Thyroid enlargement 07/12/2017   Dyslipidemia 09/20/2016   DEPRESSION 09/10/2009   LEG CRAMPS 05/19/2009   GERD 02/15/2009   Gastroparesis 09/03/2008   Dental caries 07/14/2008   Essential hypertension 09/16/2003     Current Outpatient Medications on File Prior to Visit  Medication Sig Dispense Refill   atorvastatin (LIPITOR) 80 MG tablet TAKE 1 TABLET BY MOUTH DAILY AT 6 PM. 90 tablet 0   BAQSIMI ONE PACK 3 MG/DOSE POWD Place 1 spray into the nose as needed (Low blood sugar).     Blood Pressure KIT Check blood pressure twice a day Dx: I10, hypertension 1 kit 0   Carboxymethylcellulose Sodium (EYE DROPS OP) Place 1 drop into both eyes daily.     carvedilol (COREG) 6.25  MG tablet TAKE 1 TABLET BY MOUTH 2 TIMES DAILY WITH A MEAL. 180 tablet 0   clopidogrel (PLAVIX) 75 MG tablet TAKE 1 TABLET BY MOUTH EVERY DAY WITH BREAKFAST (Patient taking differently: Take 75 mg by mouth at bedtime.) 90 tablet 3   Continuous Blood Gluc Receiver (FREESTYLE LIBRE 2 READER) DEVI Use as directed 3 times daily 1 each 12   Continuous Blood Gluc Receiver (FREESTYLE  LIBRE 2 READER) DEVI Scan as needed for continuous glucose monitoring. 1 each 0   Continuous Blood Gluc Sensor (FREESTYLE LIBRE 2 SENSOR) MISC Change sensor every 2 weeks. 2 each 12   Continuous Blood Gluc Sensor (FREESTYLE LIBRE 2 SENSOR) MISC Inject 1 sensor to the skin every 14 days for continuous glucose monitoring. 2 each 11   gabapentin (NEURONTIN) 600 MG tablet Take 1 tablet (600 mg total) by mouth 2 (two) times daily. (Patient taking differently: Take 650 mg by mouth 2 (two) times daily.) 60 tablet 6   glucose blood (ONETOUCH VERIO) test strip 1 each by Misc.(Non-Drug; Combo Route) route 4 times daily before meals and nightly. 400 each 0   hydrALAZINE (APRESOLINE) 25 MG tablet Take 1 tablet (25 mg total) by mouth 3 (three) times daily. 90 tablet 3   insulin aspart (NOVOLOG) 100 UNIT/ML FlexPen Inject 10-17 units into the skin 3 times daily before meals. (Patient taking differently: Inject 10 Units into the skin 3 (three) times daily with meals.) 45 mL 3   Lancets MISC Please dispense the Single/disposable Lancets.  Check BS 4x/day 100 each 3   Multiple Vitamins-Minerals (MULTIVITAMIN ADULT) CHEW Chew 2 tablets by mouth daily.     mupirocin cream (BACTROBAN) 2 % Apply topically daily. Apply to left foot wound and cover with dry dressing once daily 30 g 0   Olmesartan-amLODIPine-HCTZ 40-10-25 MG TABS Take 1 tablet by mouth daily. 90 tablet 1   RESTASIS 0.05 % ophthalmic emulsion Place 1 drop into both eyes 2 (two) times daily.     TOUJEO SOLOSTAR 300 UNIT/ML Solostar Pen Inject 70 Units into the skin daily.     traMADol (ULTRAM) 50 MG tablet Take 1 tablet (50 mg total) by mouth every 12 (twelve) hours as needed. 14 tablet 1   TRUEPLUS PEN NEEDLES 31G X 5 MM MISC Use to inject Toujeo once daily. 100 each 2   VITAMIN D PO Take 2 tablets by mouth at bedtime.     No current facility-administered medications on file prior to visit.    Allergies  Allergen Reactions   Other Swelling    Seaweed=  swelling on arms, hands and face   Metformin And Related Nausea Only    Social History   Socioeconomic History   Marital status: Married    Spouse name: Not on file   Number of children: Not on file   Years of education: Not on file   Highest education level: Not on file  Occupational History   Not on file  Tobacco Use   Smoking status: Never    Passive exposure: Never   Smokeless tobacco: Never  Vaping Use   Vaping status: Never Used  Substance and Sexual Activity   Alcohol use: No   Drug use: No   Sexual activity: Never  Other Topics Concern   Not on file  Social History Narrative   Not on file   Social Drivers of Health   Financial Resource Strain: Low Risk  (10/05/2022)   Overall Financial Resource Strain (CARDIA)  Difficulty of Paying Living Expenses: Not hard at all  Food Insecurity: No Food Insecurity (09/24/2023)   Hunger Vital Sign    Worried About Running Out of Food in the Last Year: Never true    Ran Out of Food in the Last Year: Never true  Transportation Needs: No Transportation Needs (10/05/2022)   PRAPARE - Administrator, Civil Service (Medical): No    Lack of Transportation (Non-Medical): No  Physical Activity: Inactive (10/05/2022)   Exercise Vital Sign    Days of Exercise per Week: 0 days    Minutes of Exercise per Session: 0 min  Stress: No Stress Concern Present (10/05/2022)   [employer] of Occupational Health - Occupational Stress Questionnaire    Feeling of Stress : Only a little  Social Connections: Socially Integrated (12/18/2023)   Social Connection and Isolation Panel [NHANES]    Frequency of Communication with Friends and Family: Twice a week    Frequency of Social Gatherings with Friends and Family: Twice a week    Attends Religious Services: More than 4 times per year    Active Member of [employer] or Organizations: Yes    Attends Banker Meetings: More than 4 times per year    Marital Status: Married   Catering manager Violence: Not At Risk (10/05/2022)   Humiliation, Afraid, Rape, and Kick questionnaire    Fear of Current or Ex-Partner: No    Emotionally Abused: No    Physically Abused: No    Sexually Abused: No    Family History  Problem Relation Age of Onset   Kidney disease Mother    Hypertension Mother    Diabetes Father    Hypertension Father    Hypertension Paternal Aunt    Diabetes Paternal Aunt    Hypertension Paternal Uncle    Diabetes Paternal Uncle    Cancer Maternal Grandmother    Hypertension Maternal Grandfather    Diabetes Paternal Grandfather    Colon cancer Neg Hx    Breast cancer Neg Hx     Past Surgical History:  Procedure Laterality Date   ABDOMINAL AORTOGRAM W/LOWER EXTREMITY N/A 04/06/2022   Procedure: ABDOMINAL AORTOGRAM W/LOWER EXTREMITY;  Surgeon: Cephus Shelling, MD;  [facility];  Service: Cardiovascular;  Laterality: N/A;   AMPUTATION TOE Left 10/26/2017   Procedure: AMPUTATION PARTIAL RAY LEFT FOOT AND IRRIGATION/DEBRIDEMENT;  Surgeon: Felecia Shelling, DPM;  Location: [facility];  Service: Podiatry;  Laterality: Left;  AMPUTATION PARTIAL RAY LEFT FOOT AND IRRIGATION/DEBRIDEMENT   CHOLECYSTECTOMY  01/24/2003   [name] 04/04/2011   COLONOSCOPY     INCISION AND DRAINAGE ABSCESS  03/17/2012   "right but; left lower abdoment"/notes 03/17/2012   LOWER EXTREMITY INTERVENTION N/A 09/10/2017   Procedure: LOWER EXTREMITY INTERVENTION;  Surgeon: Runell Gess, MD;  [facility];  Service: Cardiovascular;  Laterality: N/A;   PERIPHERAL VASCULAR ATHERECTOMY  09/10/2017   Procedure: PERIPHERAL VASCULAR ATHERECTOMY;  Surgeon: Runell Gess, MD;  Location: [facility];  Service: Cardiovascular;;  left AT   PERIPHERAL VASCULAR BALLOON ANGIOPLASTY Left 04/06/2022   Procedure: PERIPHERAL VASCULAR BALLOON ANGIOPLASTY;  Surgeon: Cephus Shelling, MD;  [facility];  Service: Cardiovascular;  Laterality:  Left;    ROS: Review of Systems Negative except as stated above  PHYSICAL EXAM: BP (!) 147/72   Pulse 65   Temp 97.6 F (36.4 C) (Oral)   Ht 6' (1.829 m)   Wt 211 lb (  95.7 kg)   SpO2 97%   BMI 28.62 kg/m   Physical Exam  General appearance - alert, well appearing, and in no distress Mental status - normal mood, behavior, speech, dress, motor activity, and thought processes Nose -mild enlargement of nasal turbinates bilaterally.   Mouth -no oral lesions.  Difficult to see the throat as patient would gag. Neck - supple, no significant adenopathy Chest - clear to auscultation, no wheezes, rales or rhonchi, symmetric air entry Heart - normal rate, regular rhythm, normal S1, S2, no murmurs, rubs, clicks or gallops      Latest Ref Rng & Units 12/27/2023    9:38 AM 12/18/2023   10:02 AM 06/18/2023    1:03 PM  CMP  Glucose 65 - 99 mg/dL 161  096  045   BUN 7 - 25 mg/dL 26  15  15    Creatinine 0.50 - 1.05 mg/dL 4.09  8.11  9.14   Sodium 135 - 146 mmol/L 138  143  139   Potassium 3.5 - 5.3 mmol/L 4.2  4.1  4.3   Chloride 98 - 110 mmol/L 99  101  97   CO2 20 - 32 mmol/L 33  30  31   Calcium 8.6 - 10.4 mg/dL 9.4  9.3  9.4   Total Protein 6.1 - 8.1 g/dL 6.7  7.0    Total Bilirubin 0.2 - 1.2 mg/dL 0.6  0.4    Alkaline Phos 44 - 121 IU/L  164    AST 10 - 35 U/L 13  13    ALT 6 - 29 U/L 11  13     Lipid Panel     Component Value Date/Time   CHOL 115 12/27/2023 0938   CHOL 140 12/18/2023 1002   TRIG 99 12/27/2023 0938   HDL 53 12/27/2023 0938   HDL 58 12/18/2023 1002   CHOLHDL 2.2 12/27/2023 0938   VLDL 36 (H) 09/19/2016 1452   LDLCALC 44 12/27/2023 0938    CBC    Component Value Date/Time   WBC 4.6 08/16/2023 1015   WBC 4.6 04/24/2022 1152   RBC 4.81 08/16/2023 1015   RBC 4.29 04/24/2022 1152   HGB 13.7 08/16/2023 1015   HCT 43.2 08/16/2023 1015   PLT 230 08/16/2023 1015   MCV 90 08/16/2023 1015   MCH 28.5 08/16/2023 1015   MCH 28.0 04/24/2022 1152   MCHC 31.7  08/16/2023 1015   MCHC 32.2 04/24/2022 1152   RDW 12.0 08/16/2023 1015   LYMPHSABS 1.9 08/16/2023 1015   MONOABS 0.7 04/01/2022 0132   EOSABS 0.2 08/16/2023 1015   BASOSABS 0.0 08/16/2023 1015    ASSESSMENT AND PLAN: 1. Viral upper respiratory tract infection (Primary) Post-COVID-19 cough Persistent dry cough likely post-viral with possible post-nasal drip. - Prescribe Claritin for symptomatic relief.  No need for antibiotic. - Prescribe Flonase nasal spray for nasal congestion. - loratadine (CLARITIN) 10 MG tablet; Take 1 tablet (10 mg total) by mouth daily.  Dispense: 30 tablet; Refill: 0 - fluticasone (FLONASE) 50 MCG/ACT nasal spray; 1 spray each nostril daily x 1 week then PRN  Dispense: 9.9 mL; Refill: 0  2. Essential hypertension Not at goal.  She has not taken blood pressure medicines as yet for today.  She will take them as soon as she returns home. Encouraged to stay hydrated by drinking 4 to 8 glasses of water daily.  3. Vitamin B12 deficiency - Vitamin B12; Future  General Health Maintenance Lancet prescription issue discussed. - Instruct  to bring the correct lancet box to the pharmacy to resolve prescription issue.   Patient was given the opportunity to ask questions.  Patient verbalized understanding of the plan and was able to repeat key elements of the plan.   This documentation was completed using Paediatric nurse.  Any transcriptional errors are unintentional.  Orders Placed This Encounter  Procedures   Vitamin B12     Requested Prescriptions   Signed Prescriptions Disp Refills   loratadine (CLARITIN) 10 MG tablet 30 tablet 0    Sig: Take 1 tablet (10 mg total) by mouth daily.   fluticasone (FLONASE) 50 MCG/ACT nasal spray 9.9 mL 0    Sig: 1 spray each nostril daily x 1 week then PRN    Return for Medicare Wellness Visit in  1-2  wks with CMA/LPN.  Jonah Blue, MD, FACP

## 2024-02-11 NOTE — Patient Instructions (Addendum)
 I have sent scription to your pharmacy for Flonase nasal spray and for Claritin.  If your insurance does not pay for the Claritin, it can be purchased over-the-counter.  The generic Claritin is called loratadine.  Stay hydrated.  Drink 4-8 glasses of water daily.

## 2024-02-12 ENCOUNTER — Ambulatory Visit: Admitting: "Endocrinology

## 2024-02-20 ENCOUNTER — Encounter: Payer: Self-pay | Admitting: Pharmacist

## 2024-02-20 ENCOUNTER — Other Ambulatory Visit (HOSPITAL_BASED_OUTPATIENT_CLINIC_OR_DEPARTMENT_OTHER): Admitting: Pharmacist

## 2024-02-20 DIAGNOSIS — E1151 Type 2 diabetes mellitus with diabetic peripheral angiopathy without gangrene: Secondary | ICD-10-CM

## 2024-02-20 DIAGNOSIS — Z794 Long term (current) use of insulin: Secondary | ICD-10-CM

## 2024-02-20 DIAGNOSIS — Z7985 Long-term (current) use of injectable non-insulin antidiabetic drugs: Secondary | ICD-10-CM

## 2024-02-20 NOTE — Progress Notes (Signed)
 Pharmacy TNM Diabetes Measure Re  S:  Patient was identified in a report as being at risk for failing the True [place] Metric of A1c control (<8%) in [place] and African American patients. Last A1c was 10.6 in Feb of this year. Last PCP visit was 02/11/24 but for a sick visit. Next planned appt is 05/01/2024. She does follow with Endocrine and sees Dr. Roosevelt Locks on 03/25/2024.  Call placed to patient to discuss diabetes control and medication management. Patient has not been seen by the clinical pharmacist before. She was unavailable unfortunately. I left a HIPAA-compliant VM with instructions to return my call.   She sees Dr. Roosevelt Locks for her DM management. I reviewed her refill data on her DM medications and have listed these below.   Current diabetes medications include: Toujeo 80 units daily, Novolog 30 units TID before meals, Ozempic 1 mg weekly  Most recent fill dates:  -Toujeo: last dispensed 06/15/2023 for 90 day supply. -Humalog: last dispensed 06/20/2023 for a 90 day supply -Ozempic: last dispensed 01/20/2024 for a 90 day supply -Libre 3 sensor: last dispensed 84 day supply 01/01/2024.  Insurance coverage: [employer]  O:  Lab Results  Component Value Date   HGBA1C 10.6 (H) 12/27/2023   There were no vitals filed for this visit.  Lipid Panel     Component Value Date/Time   CHOL 115 12/27/2023 0938   CHOL 140 12/18/2023 1002   TRIG 99 12/27/2023 0938   HDL 53 12/27/2023 0938   HDL 58 12/18/2023 1002   CHOLHDL 2.2 12/27/2023 0938   VLDL 36 (H) 09/19/2016 1452   LDLCALC 44 12/27/2023 0938   Clinical Atherosclerotic Cardiovascular Disease (ASCVD): Yes ? Looks like she's had angiography d/t PAD The ASCVD Risk score (Arnett DK, et al., 2019) failed to calculate for the following reasons:   The valid total cholesterol range is 130 to 320 mg/dL   Patient is participating in a Managed Medicaid Plan: No   A/P: Diabetes longstanding currently uncontrolled. I was unable to speak to  the patient, however, dispense hx indicates non-adherence. [facility] and confirmed that she has not filled insulin since 05/2023. I authorized her pharmacy to fill these rxns on her behalf. Will also send information to patient's Endocrinologist.  - [facility] to authorize Toujeo and Humalog refills.   -Refill hx on Ozempic and Libre sensors look good.  -Next A1c anticipated 03/2024.   Follow-up:  Pharmacist prn. PCP clinic visit in 05/01/2024. Endocrine: 03/25/2024.  Butch Penny, PharmD, Patsy [place], CPP Clinical Pharmacist [facility] [phone]

## 2024-03-10 LAB — HM DIABETES EYE EXAM

## 2024-03-11 ENCOUNTER — Other Ambulatory Visit: Payer: Self-pay | Admitting: Internal Medicine

## 2024-03-11 DIAGNOSIS — J069 Acute upper respiratory infection, unspecified: Secondary | ICD-10-CM

## 2024-03-22 ENCOUNTER — Other Ambulatory Visit: Payer: Self-pay | Admitting: Internal Medicine

## 2024-03-22 DIAGNOSIS — I152 Hypertension secondary to endocrine disorders: Secondary | ICD-10-CM

## 2024-03-25 ENCOUNTER — Ambulatory Visit: Admitting: "Endocrinology

## 2024-04-10 ENCOUNTER — Other Ambulatory Visit: Payer: Self-pay | Admitting: Internal Medicine

## 2024-04-10 DIAGNOSIS — J069 Acute upper respiratory infection, unspecified: Secondary | ICD-10-CM

## 2024-04-17 ENCOUNTER — Encounter: Payer: 59 | Admitting: Internal Medicine

## 2024-04-18 ENCOUNTER — Ambulatory Visit: Payer: 59 | Admitting: Diagnostic Neuroimaging

## 2024-04-29 ENCOUNTER — Ambulatory Visit: Payer: 59 | Attending: Internal Medicine

## 2024-04-29 VITALS — Ht 71.0 in | Wt 218.0 lb

## 2024-04-29 DIAGNOSIS — Z0000 Encounter for general adult medical examination without abnormal findings: Secondary | ICD-10-CM

## 2024-04-29 NOTE — Patient Instructions (Signed)
 [name] , Thank you for taking time out of your busy schedule to complete your Annual Wellness Visit with me. I enjoyed our conversation and look forward to speaking with you again next year. I, as well as your care team,  appreciate your ongoing commitment to your health goals. Please review the following plan we discussed and let me know if I can assist you in the future. Your Game plan/ To Do List    Referrals: If you haven't heard from the office you've been referred to, please reach out to them at the phone provided.   Follow up Visits: Next Medicare AWV with our clinical staff: 05/05/2025 at 1:30 pm phone visit with Nurse [place]   Have you seen your provider in the last 6 months (3 months if uncontrolled diabetes)? Yes Next Office Visit with your provider: 05/01/2024 at 9:30 office visit with Dr. Lincoln Renshaw  Clinician Recommendations:  Aim for 30 minutes of exercise or brisk walking, 6-8 glasses of water, and 5 servings of fruits and vegetables each day.       This is a list of the screening recommended for you and due dates:  Health Maintenance  Topic Date Due   COVID-19 Vaccine (5 - 2024-25 season) 07/22/2023   Flu Shot  06/20/2024   Hemoglobin A1C  06/25/2024   Complete foot exam   09/13/2024   Yearly kidney function blood test for diabetes  12/26/2024   Yearly kidney health urinalysis for diabetes  12/26/2024   Eye exam for diabetics  03/10/2025   Medicare Annual Wellness Visit  04/29/2025   Mammogram  10/10/2025   Colon Cancer Screening  02/27/2026   DTaP/Tdap/Td vaccine (3 - Td or Tdap) 04/09/2030   Pneumonia Vaccine  Completed   DEXA scan (bone density measurement)  Completed   Hepatitis C Screening  Completed   Zoster (Shingles) Vaccine  Completed   HPV Vaccine  Aged Out   Meningitis B Vaccine  Aged Out    Advanced directives: (Declined) Advance directive discussed with you today. Even though you declined this today, please call our office should you change your mind,  and we can give you the proper paperwork for you to fill out. Advance Care Planning is important because it:  [x]  Makes sure you receive the medical care that is consistent with your values, goals, and preferences  [x]  It provides guidance to your family and loved ones and reduces their decisional burden about whether or not they are making the right decisions based on your wishes.  Follow the link provided in your after visit summary or read over the paperwork we have mailed to you to help you started getting your Advance Directives in place. If you need assistance in completing these, please reach out to us  so that we can help you!  See attachments for Preventive Care and Fall Prevention Tips.

## 2024-04-29 NOTE — Progress Notes (Signed)
 Because this visit was a virtual/telehealth visit,  certain criteria was not obtained, such a blood pressure, CBG if applicable, and timed get up and go. Any medications not marked as taking were not mentioned during the medication reconciliation part of the visit. Any vitals not documented were not able to be obtained due to this being a telehealth visit or patient was unable to self-report a recent blood pressure reading due to a lack of equipment at home via telehealth. Vitals that have been documented are verbally provided by the patient.   Subjective:   Katherine [name] is a 68 y.o. who presents for a Medicare Wellness preventive visit.  As a reminder, Annual Wellness Visits don't include a physical exam, and some assessments may be limited, especially if this visit is performed virtually. We may recommend an in-person follow-up visit with your provider if needed.  Visit Complete: Virtual I connected with  Katherine [name] on 04/29/24 by a audio enabled telemedicine application and verified that I am speaking with the correct person using two identifiers.  Patient Location: Home  Provider Location: Office/Clinic  I discussed the limitations of evaluation and management by telemedicine. The patient expressed understanding and agreed to proceed.  Vital Signs: Because this visit was a virtual/telehealth visit, some criteria may be missing or patient reported. Any vitals not documented were not able to be obtained and vitals that have been documented are patient reported.  VideoDeclined- This patient declined Librarian, academic. Therefore the visit was completed with audio only.  Persons Participating in Visit: Patient.  AWV Questionnaire: No: Patient Medicare AWV questionnaire was not completed prior to this visit.  Cardiac Risk Factors include: advanced age (>56men, >73 women);dyslipidemia;hypertension;diabetes mellitus;obesity (BMI >30kg/m2);sedentary  lifestyle;family history of premature cardiovascular disease     Objective:     Today's Vitals   04/29/24 1336  Weight: 218 lb (98.9 kg)  Height: 5' 11 (1.803 m)  PainSc: 7   PainLoc: Leg   Body mass index is 30.4 kg/m.     04/29/2024    1:39 PM 01/03/2024    9:37 AM 09/24/2023    4:10 PM 10/05/2022   12:21 PM 04/06/2022    7:28 AM 03/29/2022    5:25 PM 02/26/2022    1:02 PM  Advanced Directives  Does Patient Have a Medical Advance Directive? No No No No No No No  Would patient like information on creating a medical advance directive? No - Patient declined Yes (MAU/Ambulatory/Procedural Areas - Information given) Yes (MAU/Ambulatory/Procedural Areas - Information given) Yes (Inpatient - patient defers creating a medical advance directive at this time - Information given) No - Patient declined  No - Patient declined    Current Medications (verified) Outpatient Encounter Medications as of 04/29/2024  Medication Sig   atorvastatin  (LIPITOR ) 80 MG tablet TAKE 1 TABLET BY MOUTH DAILY AT 6 PM.   BAQSIMI ONE PACK 3 MG/DOSE POWD Place 1 spray into the nose as needed (Low blood sugar).   Blood Pressure KIT Check blood pressure twice a day Dx: I10, hypertension   Carboxymethylcellulose Sodium (EYE DROPS OP) Place 1 drop into both eyes daily.   carvedilol  (COREG ) 6.25 MG tablet TAKE 1 TABLET BY MOUTH 2 TIMES DAILY WITH A MEAL.   clopidogrel  (PLAVIX ) 75 MG tablet TAKE 1 TABLET BY MOUTH EVERY DAY WITH BREAKFAST (Patient taking differently: Take 75 mg by mouth at bedtime.)   Continuous Blood Gluc Receiver (FREESTYLE LIBRE 2 READER) DEVI Use as directed 3 times  daily   Continuous Blood Gluc Receiver (FREESTYLE LIBRE 2 READER) DEVI Scan as needed for continuous glucose monitoring.   Continuous Blood Gluc Sensor (FREESTYLE LIBRE 2 SENSOR) MISC Change sensor every 2 weeks.   Continuous Blood Gluc Sensor (FREESTYLE LIBRE 2 SENSOR) MISC Inject 1 sensor to the skin every 14 days for continuous glucose  monitoring.   fluticasone  (FLONASE ) 50 MCG/ACT nasal spray 1 SPRAY EACH NOSTRIL DAILY X 1 WEEK THEN AS NEEDED   gabapentin  (NEURONTIN ) 600 MG tablet Take 1 tablet (600 mg total) by mouth 2 (two) times daily. (Patient taking differently: Take 650 mg by mouth 2 (two) times daily.)   glucose blood (ONETOUCH VERIO) test strip 1 each by Misc.(Non-Drug; Combo Route) route 4 times daily before meals and nightly.   hydrALAZINE  (APRESOLINE ) 25 MG tablet Take 1 tablet (25 mg total) by mouth 3 (three) times daily.   insulin  aspart (NOVOLOG ) 100 UNIT/ML FlexPen Inject 10-17 units into the skin 3 times daily before meals. (Patient taking differently: Inject 10 Units into the skin 3 (three) times daily with meals.)   Lancets MISC Please dispense the Single/disposable Lancets.  Check BS 4x/day   loratadine  (CLARITIN ) 10 MG tablet TAKE 1 TABLET BY MOUTH EVERY DAY   Multiple Vitamins-Minerals (MULTIVITAMIN ADULT) CHEW Chew 2 tablets by mouth daily.   mupirocin  cream (BACTROBAN ) 2 % Apply topically daily. Apply to left foot wound and cover with dry dressing once daily   Olmesartan -amLODIPine -HCTZ 40-10-25 MG TABS Take 1 tablet by mouth daily.   RESTASIS 0.05 % ophthalmic emulsion Place 1 drop into both eyes 2 (two) times daily.   TOUJEO  SOLOSTAR 300 UNIT/ML Solostar Pen Inject 70 Units into the skin daily.   traMADol  (ULTRAM ) 50 MG tablet Take 1 tablet (50 mg total) by mouth every 12 (twelve) hours as needed.   TRUEPLUS PEN NEEDLES 31G X 5 MM MISC Use to inject Toujeo  once daily.   VITAMIN D  PO Take 2 tablets by mouth at bedtime.   No facility-administered encounter medications on file as of 04/29/2024.    Allergies (verified) Other and Metformin  and related   History: Past Medical History:  Diagnosis Date   Anemia    requiring transfusion   Boil of buttock ~ 2016   Depression    Diabetic neuropathy (HCC)    Maximo Spar 08/23/2017   DKA (diabetic ketoacidoses)    recent/notes 01/06/2015   Exertional dyspnea  08/15/2023   Hyperlipidemia    Hypertension    Murmur 10/19/2022   Neuropathy    Peripheral arterial disease (HCC)    Peripheral vascular disease (HCC)    Toe ulcer (HCC)    left great toe/notes 08/23/2017   Type II diabetes mellitus (HCC)    Past Surgical History:  Procedure Laterality Date   ABDOMINAL AORTOGRAM W/LOWER EXTREMITY N/A 04/06/2022   Procedure: ABDOMINAL AORTOGRAM W/LOWER EXTREMITY;  Surgeon: Young Hensen, MD;  Location: [facility] INVASIVE CV LAB;  Service: Cardiovascular;  Laterality: N/A;   AMPUTATION TOE Left 10/26/2017   Procedure: AMPUTATION PARTIAL RAY LEFT FOOT AND IRRIGATION/DEBRIDEMENT;  Surgeon: Dot Gazella, DPM;  Location: [facility] OR;  Service: Podiatry;  Laterality: Left;  AMPUTATION PARTIAL RAY LEFT FOOT AND IRRIGATION/DEBRIDEMENT   CHOLECYSTECTOMY  01/24/2003   Maximo Spar 04/04/2011   COLONOSCOPY     INCISION AND DRAINAGE ABSCESS  03/17/2012   right but; left lower abdoment/notes 03/17/2012   LOWER EXTREMITY INTERVENTION N/A 09/10/2017   Procedure: LOWER EXTREMITY INTERVENTION;  Surgeon: Avanell Leigh, MD;  [facility] INVASIVE  CV LAB;  Service: Cardiovascular;  Laterality: N/A;   PERIPHERAL VASCULAR ATHERECTOMY  09/10/2017   Procedure: PERIPHERAL VASCULAR ATHERECTOMY;  Surgeon: Avanell Leigh, MD;  [facility] INVASIVE CV LAB;  Service: Cardiovascular;;  left AT   PERIPHERAL VASCULAR BALLOON ANGIOPLASTY Left 04/06/2022   Procedure: PERIPHERAL VASCULAR BALLOON ANGIOPLASTY;  Surgeon: Young Hensen, MD;  Location: [facility] INVASIVE CV LAB;  Service: Cardiovascular;  Laterality: Left;   Family History  Problem Relation Age of Onset   Kidney disease Mother    Hypertension Mother    Diabetes Father    Hypertension Father    Hypertension Paternal Aunt    Diabetes Paternal Aunt    Hypertension Paternal Uncle    Diabetes Paternal Uncle    Cancer Maternal Grandmother    Hypertension Maternal Grandfather    Diabetes Paternal Grandfather    Colon cancer Neg  Hx    Breast cancer Neg Hx    Social History   Socioeconomic History   Marital status: Married    Spouse name: Not on file   Number of children: Not on file   Years of education: Not on file   Highest education level: Not on file  Occupational History   Not on file  Tobacco Use   Smoking status: Never    Passive exposure: Never   Smokeless tobacco: Never  Vaping Use   Vaping status: Never Used  Substance and Sexual Activity   Alcohol  use: No   Drug use: No   Sexual activity: Never  Other Topics Concern   Not on file  Social History Narrative   Not on file   Social Drivers of Health   Financial Resource Strain: Low Risk  (04/29/2024)   Overall Financial Resource Strain (CARDIA)    Difficulty of Paying Living Expenses: Not hard at all  Food Insecurity: No Food Insecurity (04/29/2024)   Hunger Vital Sign    Worried About Running Out of Food in the Last Year: Never true    Ran Out of Food in the Last Year: Never true  Transportation Needs: No Transportation Needs (04/29/2024)   PRAPARE - Administrator, Civil Service (Medical): No    Lack of Transportation (Non-Medical): No  Physical Activity: Inactive (04/29/2024)   Exercise Vital Sign    Days of Exercise per Week: 0 days    Minutes of Exercise per Session: 0 min  Stress: No Stress Concern Present (04/29/2024)   [employer] of Occupational Health - Occupational Stress Questionnaire    Feeling of Stress : Not at all  Social Connections: Socially Integrated (04/29/2024)   Social Connection and Isolation Panel [NHANES]    Frequency of Communication with Friends and Family: Twice a week    Frequency of Social Gatherings with Friends and Family: Twice a week    Attends Religious Services: More than 4 times per year    Active Member of [employer] or Organizations: Yes    Attends Engineer, structural: More than 4 times per year    Marital Status: Married    Tobacco Counseling Counseling given: Not  Answered    Clinical Intake:  Pre-visit preparation completed: Yes  Pain : 0-10 Pain Score: 7  Pain Type: Chronic pain Pain Location: Leg Pain Orientation: Right, Left Pain Radiating Towards: LOWER FEET     BMI - recorded: 30.4 Nutritional Status: BMI > 30  Obese Nutritional Risks: None Diabetes: Yes CBG done?: No Did pt. bring in CBG monitor from home?: No  Lab  Results  Component Value Date   HGBA1C 10.6 (H) 12/27/2023   HGBA1C 10.7 (A) 12/18/2023   HGBA1C 10.5 (A) 09/14/2023     How often do you need to have someone help you when you read instructions, pamphlets, or other written materials from your doctor or pharmacy?: 1 - Never  Interpreter Needed?: No  Information entered by :: Druscilla Gerhard, LPN.   Activities of Daily Living     04/29/2024    1:41 PM  In your present state of health, do you have any difficulty performing the following activities:  Hearing? 0  Vision? 0  Difficulty concentrating or making decisions? 1  Comment CONCENTRATING  Walking or climbing stairs? 0  Dressing or bathing? 0  Doing errands, shopping? 0  Preparing Food and eating ? N  Using the Toilet? N  In the past six months, have you accidently leaked urine? N  Do you have problems with loss of bowel control? N  Managing your Medications? N  Managing your Finances? N  Housekeeping or managing your Housekeeping? N    Patient Care Team: Lawrance Presume, MD as PCP - General (Internal Medicine) Maudine Sos, MD as Attending Physician (Cardiology) Devin Foerster, MD as Consulting Physician (Ophthalmology)  I have updated your Care Teams any recent Medical Services you may have received from other providers in the past year.     Assessment:    This is a routine wellness examination for Katherine [name].  Hearing/Vision screen Hearing Screening - Comments:: Denies hearing difficulties.   Vision Screening - Comments:: Wears reading glasses - up to date with routine eye  exams with Devin Foerster, MD.    Goals Addressed             This Visit's Progress    04/29/2024: My goal is to keep my blood pressure and diabetes in mnormal ranges and start physical therapy to help eliminate the pain in my legs.       Blood Pressure < 140/90       HEMOGLOBIN A1C < 7.0         Depression Screen     04/29/2024    1:42 PM 02/11/2024    3:08 PM 12/18/2023    9:08 AM 09/24/2023    4:20 PM 11/24/2022   11:13 AM 10/05/2022   11:57 AM 05/15/2022    3:19 PM  PHQ 2/9 Scores  PHQ - 2 Score 0 4 2 0 1 2 5   PHQ- 9 Score 4 16 9  6 9 10     Fall Risk     04/29/2024    1:39 PM 12/18/2023    9:07 AM 09/24/2023    4:20 PM 06/08/2023    4:29 PM 11/24/2022   10:07 AM  Fall Risk   Falls in the past year? 0 1 0 0 0  Number falls in past yr: 0  0 0 0  Injury with Fall? 0 1 0 0 0  Risk for fall due to : No Fall Risks History of fall(s);Other (Comment)  No Fall Risks No Fall Risks  Follow up Falls evaluation completed --     Comment  h/o falls       MEDICARE RISK AT HOME:  Medicare Risk at Home Any stairs in or around the home?: No If so, are there any without handrails?: No Home free of loose throw rugs in walkways, pet beds, electrical cords, etc?: Yes Adequate lighting in your home to reduce risk of falls?: Yes Life alert?: No  Use of a cane, walker or w/c?: Yes Grab bars in the bathroom?: Yes Shower chair or bench in shower?: Yes Elevated toilet seat or a handicapped toilet?: Yes  TIMED UP AND GO:  Was the test performed?  No  Cognitive Function: 6CIT completed    10/05/2022   12:05 PM 05/15/2022    3:22 PM  MMSE - Mini Mental State Exam  Orientation to time 3 5  Orientation to Place 4 5  Registration 3 3  Attention/ Calculation 5 5  Recall 3 3  Language- name 2 objects 2 2  Language- repeat 1 1  Language- follow 3 step command 3 2  Language- read & follow direction 1 1  Write a sentence 1 1  Copy design 1 1  Total score 27 29        04/29/2024     1:43 PM  6CIT Screen  What Year? 0 points  What month? 0 points  What time? 0 points  Count back from 20 0 points  Months in reverse 0 points  Repeat phrase 0 points  Total Score 0 points    Immunizations Immunization History  Administered Date(s) Administered   Fluad Quad(high Dose 65+) 10/05/2022   Fluad Trivalent(High Dose 65+) 09/14/2023   Influenza Split 01/13/2013   Influenza Whole 10/28/2008, 09/10/2009   Influenza,inj,Quad PF,6+ Mos 12/17/2014, 01/12/2016, 09/19/2016, 10/23/2017, 08/28/2018, 10/25/2019   Moderna Sars-Covid-2 Vaccination 02/11/2020, 03/03/2020, 03/05/2021, 09/06/2021   PNEUMOCOCCAL CONJUGATE-20 05/03/2021   PPD Test 02/02/2014, 07/07/2015, 12/26/2016, 02/19/2018, 12/29/2019, 02/04/2021, 02/07/2022, 02/05/2023, 01/01/2024   Pneumococcal Polysaccharide-23 07/14/2008, 06/23/2014   Td 10/21/2009   Tdap 04/09/2020   Zoster Recombinant(Shingrix ) 05/15/2022, 08/22/2022    Screening Tests Health Maintenance  Topic Date Due   COVID-19 Vaccine (5 - 2024-25 season) 07/22/2023   INFLUENZA VACCINE  06/20/2024   HEMOGLOBIN A1C  06/25/2024   FOOT EXAM  09/13/2024   Diabetic kidney evaluation - eGFR measurement  12/26/2024   Diabetic kidney evaluation - Urine ACR  12/26/2024   OPHTHALMOLOGY EXAM  03/10/2025   Medicare Annual Wellness (AWV)  04/29/2025   MAMMOGRAM  10/10/2025   Colonoscopy  02/27/2026   DTaP/Tdap/Td (3 - Td or Tdap) 04/09/2030   Pneumonia Vaccine 80+ Years old  Completed   DEXA SCAN  Completed   Hepatitis C Screening  Completed   Zoster Vaccines- Shingrix   Completed   HPV VACCINES  Aged Out   Meningococcal B Vaccine  Aged Out    Health Maintenance  Health Maintenance Due  Topic Date Due   COVID-19 Vaccine (5 - 2024-25 season) 07/22/2023   Health Maintenance Items Addressed: Yes Patient aware of current care gaps.  Immunization record was verified by [facility] and updated in patient's chart.  Additional Screening:  Vision Screening:  Recommended annual ophthalmology exams for early detection of glaucoma and other disorders of the eye. Would you like a referral to an eye doctor? No    Dental Screening: Recommended annual dental exams for proper oral hygiene  Community Resource Referral / Chronic Care Management: CRR required this visit?  No   CCM required this visit?  No   Plan:    I have personally reviewed and noted the following in the patient's chart:   Medical and social history Use of alcohol , tobacco or illicit drugs  Current medications and supplements including opioid prescriptions. Patient is not currently taking opioid prescriptions. Functional ability and status Nutritional status Physical activity Advanced directives List of other physicians Hospitalizations, surgeries, and ER visits in previous  12 months Vitals Screenings to include cognitive, depression, and falls Referrals and appointments  In addition, I have reviewed and discussed with patient certain preventive protocols, quality metrics, and best practice recommendations. A written personalized care plan for preventive services as well as general preventive health recommendations were provided to patient.   Margette Sheldon, LPN   [id number]/[id number]   After Visit Summary: (MyChart) Due to this being a telephonic visit, the after visit summary with patients personalized plan was offered to patient via MyChart   Notes: Patient aware of current care gaps.  Immunization record was verified by [facility] and updated in patient's chart.

## 2024-05-01 ENCOUNTER — Ambulatory Visit: Payer: 59 | Admitting: Internal Medicine

## 2024-05-01 ENCOUNTER — Encounter: Payer: Self-pay | Admitting: Internal Medicine

## 2024-05-01 ENCOUNTER — Ambulatory Visit: Attending: Internal Medicine | Admitting: Internal Medicine

## 2024-05-01 VITALS — BP 178/72 | HR 69 | Ht 71.0 in | Wt 213.4 lb

## 2024-05-01 DIAGNOSIS — I152 Hypertension secondary to endocrine disorders: Secondary | ICD-10-CM

## 2024-05-01 DIAGNOSIS — I10 Essential (primary) hypertension: Secondary | ICD-10-CM | POA: Diagnosis not present

## 2024-05-01 DIAGNOSIS — Z7985 Long-term (current) use of injectable non-insulin antidiabetic drugs: Secondary | ICD-10-CM | POA: Diagnosis not present

## 2024-05-01 DIAGNOSIS — E1142 Type 2 diabetes mellitus with diabetic polyneuropathy: Secondary | ICD-10-CM | POA: Diagnosis not present

## 2024-05-01 DIAGNOSIS — E785 Hyperlipidemia, unspecified: Secondary | ICD-10-CM

## 2024-05-01 DIAGNOSIS — E538 Deficiency of other specified B group vitamins: Secondary | ICD-10-CM

## 2024-05-01 DIAGNOSIS — E1169 Type 2 diabetes mellitus with other specified complication: Secondary | ICD-10-CM

## 2024-05-01 MED ORDER — HYDRALAZINE HCL 100 MG PO TABS
ORAL_TABLET | ORAL | 1 refills | Status: DC
Start: 2024-05-01 — End: 2024-08-05

## 2024-05-01 NOTE — Patient Instructions (Addendum)
 Change hydralazine  to 100 mg in the morning and half a tablet (50 mg) in the evenings.  I will send an updated prescription to your pharmacy.  Try to limit salt in the foods.  We will have you follow-up in the hypertension clinic with Dr. Theodis Fiscal.  Please try to check your blood sugars at least twice a day and record the readings.  Take those readings with you when you go to see your endocrinologist next week.  Let her know that you are experiencing some nausea from the Ozempic.

## 2024-05-01 NOTE — Progress Notes (Signed)
 Patient ID: Katherine [name], female    DOB: [birth year]  MRN: [id number]  CC: Medical Management of Chronic Issues (No concerns)   Subjective: Katherine [name] is a 68 y.o. female who presents for chronic ds management. Her concerns today include:  Hx of HTN, DM with neuropathy, retinopathy and possible gastroparesis, HL, depression, GERD, PAD ( left anterior tibial angioplasty 03/2022 for left fifth toe osteomyelitis), s/p amputation LT 1st toe, BL RAS 59%.    HTN: should be on Hydralazine  25 mg TID, Olmesartan /amlodipine /hydrochlorothiazide  40-10-25 mg daily and Coreg  6.25 mg BID.  She tells me that she has the olmesartan  combination and the carvedilol  and taking those as prescribed.  However the hydralazine  she reports that she has the 100 mg tablets at home which she was on previously and has been taking 1 a day; tried taking twice a day but it made her dizzy.  States that she never picked up the prescription for the 25 mg tablet. Took her medicines already for the morning. Wanting to the follow-up appointment with Dr. Theodis Fiscal [facility].  DM: Lab Results  Component Value Date   HGBA1C 10.6 (H) 12/27/2023  She is seen the endocrinologist Dr. Vertell Gory in March.  She was left on Toujeo  80 units daily, NovoLog  30 units with meals but reports taking 25 units with meals and Ozempic 0.25 mg once a week.  Reports compliance with medications.  She has been having some nausea and decreased appetite with the Ozempic.  Down 5 pounds.  States she does not want to lose too much weight. -Her CGM continues to fall off.  She has not been checking blood sugars manually and has a follow-up appointment with her endocrinologist next week.  HL: Taking the atorvastatin  80 mg as prescribed.  History of vitamin B12 deficiency in the past.  We had ordered B12 level on last visit but she did not have it done.  We will have her stop at the lab today. Patient Active Problem List   Diagnosis  Date Noted   Exertional dyspnea 08/15/2023   Mild major depression (HCC) 11/24/2022   Murmur 10/19/2022   Medication monitoring encounter 04/10/2022   Cellulitis of left lower extremity    Neuropathy 12/08/2021   Diabetic gastroparesis associated with type 2 diabetes mellitus (HCC) 07/30/2021   Type 2 diabetes mellitus with diabetic peripheral angiopathy without gangrene, with long-term current use of insulin  (HCC) 07/30/2021   Amputation of left great toe (HCC) 06/13/2021   Microalbuminuria due to type 2 diabetes mellitus (HCC) 05/04/2021   Diabetes (HCC) 12/22/2019   Renal artery stenosis (HCC) 12/01/2019   Diabetic polyneuropathy associated with type 2 diabetes mellitus (HCC) 05/14/2018   Thyroid  nodule 10/04/2017   Microcytic anemia 09/11/2017   Retinopathy 08/22/2017   Thyroid  enlargement 07/12/2017   Dyslipidemia 09/20/2016   DEPRESSION 09/10/2009   LEG CRAMPS 05/19/2009   GERD 02/15/2009   Gastroparesis 09/03/2008   Dental caries 07/14/2008   Essential hypertension 09/16/2003     Current Outpatient Medications on File Prior to Visit  Medication Sig Dispense Refill   atorvastatin  (LIPITOR ) 80 MG tablet TAKE 1 TABLET BY MOUTH DAILY AT 6 PM. 90 tablet 0   BAQSIMI ONE PACK 3 MG/DOSE POWD Place 1 spray into the nose as needed (Low blood sugar).     Blood Pressure KIT Check blood pressure twice a day Dx: I10, hypertension 1 kit 0   Carboxymethylcellulose Sodium (EYE DROPS OP) Place 1 drop into both eyes daily.  carvedilol  (COREG ) 6.25 MG tablet TAKE 1 TABLET BY MOUTH 2 TIMES DAILY WITH A MEAL. 180 tablet 0   clopidogrel  (PLAVIX ) 75 MG tablet TAKE 1 TABLET BY MOUTH EVERY DAY WITH BREAKFAST (Patient taking differently: Take 75 mg by mouth at bedtime.) 90 tablet 3   Continuous Blood Gluc Receiver (FREESTYLE LIBRE 2 READER) DEVI Use as directed 3 times daily 1 each 12   Continuous Blood Gluc Receiver (FREESTYLE LIBRE 2 READER) DEVI Scan as needed for continuous glucose monitoring.  1 each 0   Continuous Blood Gluc Sensor (FREESTYLE LIBRE 2 SENSOR) MISC Change sensor every 2 weeks. 2 each 12   Continuous Blood Gluc Sensor (FREESTYLE LIBRE 2 SENSOR) MISC Inject 1 sensor to the skin every 14 days for continuous glucose monitoring. 2 each 11   fluticasone  (FLONASE ) 50 MCG/ACT nasal spray 1 SPRAY EACH NOSTRIL DAILY X 1 WEEK THEN AS NEEDED 24 mL 1   gabapentin  (NEURONTIN ) 600 MG tablet Take 1 tablet (600 mg total) by mouth 2 (two) times daily. (Patient taking differently: Take 650 mg by mouth 2 (two) times daily.) 60 tablet 6   glucose blood (ONETOUCH VERIO) test strip 1 each by Misc.(Non-Drug; Combo Route) route 4 times daily before meals and nightly. 400 each 0   insulin  aspart (NOVOLOG ) 100 UNIT/ML FlexPen Inject 10-17 units into the skin 3 times daily before meals. (Patient taking differently: Inject 10 Units into the skin 3 (three) times daily with meals.) 45 mL 3   Lancets MISC Please dispense the Single/disposable Lancets.  Check BS 4x/day 100 each 3   loratadine  (CLARITIN ) 10 MG tablet TAKE 1 TABLET BY MOUTH EVERY DAY 90 tablet 1   Multiple Vitamins-Minerals (MULTIVITAMIN ADULT) CHEW Chew 2 tablets by mouth daily.     mupirocin  cream (BACTROBAN ) 2 % Apply topically daily. Apply to left foot wound and cover with dry dressing once daily 30 g 0   Olmesartan -amLODIPine -HCTZ 40-10-25 MG TABS Take 1 tablet by mouth daily. 90 tablet 1   RESTASIS 0.05 % ophthalmic emulsion Place 1 drop into both eyes 2 (two) times daily.     TOUJEO  SOLOSTAR 300 UNIT/ML Solostar Pen Inject 70 Units into the skin daily.     traMADol  (ULTRAM ) 50 MG tablet Take 1 tablet (50 mg total) by mouth every 12 (twelve) hours as needed. 14 tablet 1   TRUEPLUS PEN NEEDLES 31G X 5 MM MISC Use to inject Toujeo  once daily. 100 each 2   VITAMIN D  PO Take 2 tablets by mouth at bedtime.     No current facility-administered medications on file prior to visit.    Allergies  Allergen Reactions   Other Swelling     Seaweed= swelling on arms, hands and face   Metformin  And Related Nausea Only    Social History   Socioeconomic History   Marital status: Married    Spouse name: Not on file   Number of children: Not on file   Years of education: Not on file   Highest education level: Not on file  Occupational History   Not on file  Tobacco Use   Smoking status: Never    Passive exposure: Never   Smokeless tobacco: Never  Vaping Use   Vaping status: Never Used  Substance and Sexual Activity   Alcohol  use: No   Drug use: No   Sexual activity: Never  Other Topics Concern   Not on file  Social History Narrative   Not on file   Social Drivers of  Health   Financial Resource Strain: Low Risk  (04/29/2024)   Overall Financial Resource Strain (CARDIA)    Difficulty of Paying Living Expenses: Not hard at all  Food Insecurity: No Food Insecurity (04/29/2024)   Hunger Vital Sign    Worried About Running Out of Food in the Last Year: Never true    Ran Out of Food in the Last Year: Never true  Transportation Needs: No Transportation Needs (04/29/2024)   PRAPARE - Administrator, Civil Service (Medical): No    Lack of Transportation (Non-Medical): No  Physical Activity: Inactive (04/29/2024)   Exercise Vital Sign    Days of Exercise per Week: 0 days    Minutes of Exercise per Session: 0 min  Stress: No Stress Concern Present (04/29/2024)   [employer] of Occupational Health - Occupational Stress Questionnaire    Feeling of Stress : Not at all  Social Connections: Socially Integrated (04/29/2024)   Social Connection and Isolation Panel    Frequency of Communication with Friends and Family: Twice a week    Frequency of Social Gatherings with Friends and Family: Twice a week    Attends Religious Services: More than 4 times per year    Active Member of [employer] or Organizations: Yes    Attends Banker Meetings: More than 4 times per year    Marital Status: Married  Careers information officer Violence: Not At Risk (04/29/2024)   Humiliation, Afraid, Rape, and Kick questionnaire    Fear of Current or Ex-Partner: No    Emotionally Abused: No    Physically Abused: No    Sexually Abused: No    Family History  Problem Relation Age of Onset   Kidney disease Mother    Hypertension Mother    Diabetes Father    Hypertension Father    Hypertension Paternal Aunt    Diabetes Paternal Aunt    Hypertension Paternal Uncle    Diabetes Paternal Uncle    Cancer Maternal Grandmother    Hypertension Maternal Grandfather    Diabetes Paternal Grandfather    Colon cancer Neg Hx    Breast cancer Neg Hx     Past Surgical History:  Procedure Laterality Date   ABDOMINAL AORTOGRAM W/LOWER EXTREMITY N/A 04/06/2022   Procedure: ABDOMINAL AORTOGRAM W/LOWER EXTREMITY;  Surgeon: Young Hensen, MD;  Location: [facility];  Service: Cardiovascular;  Laterality: N/A;   AMPUTATION TOE Left 10/26/2017   Procedure: AMPUTATION PARTIAL RAY LEFT FOOT AND IRRIGATION/DEBRIDEMENT;  Surgeon: Dot Gazella, DPM;  Location: [facility] OR;  Service: Podiatry;  Laterality: Left;  AMPUTATION PARTIAL RAY LEFT FOOT AND IRRIGATION/DEBRIDEMENT   CHOLECYSTECTOMY  01/24/2003   Maximo Spar 04/04/2011   COLONOSCOPY     INCISION AND DRAINAGE ABSCESS  03/17/2012   right but; left lower abdoment/notes 03/17/2012   LOWER EXTREMITY INTERVENTION N/A 09/10/2017   Procedure: LOWER EXTREMITY INTERVENTION;  Surgeon: Avanell Leigh, MD;  Location: [facility];  Service: Cardiovascular;  Laterality: N/A;   PERIPHERAL VASCULAR ATHERECTOMY  09/10/2017   Procedure: PERIPHERAL VASCULAR ATHERECTOMY;  Surgeon: Avanell Leigh, MD;  [facility];  Service: Cardiovascular;;  left AT   PERIPHERAL VASCULAR BALLOON ANGIOPLASTY Left 04/06/2022   Procedure: PERIPHERAL VASCULAR BALLOON ANGIOPLASTY;  Surgeon: Young Hensen, MD;  Location: [facility];  Service: Cardiovascular;  Laterality: Left;     ROS: Review of Systems Negative except as stated above  PHYSICAL EXAM: BP (!) 178/72   Pulse 69  Ht 5' 11 (1.803 m)   Wt 213 lb 6.4 oz (96.8 kg)   SpO2 99%   BMI 29.76 kg/m   Wt Readings from Last 3 Encounters:  05/01/24 213 lb 6.4 oz (96.8 kg)  04/29/24 218 lb (98.9 kg)  02/11/24 211 lb (95.7 kg)    Physical Exam  General appearance - alert, well appearing, and in no distress Mental status - normal mood, behavior, speech, dress, motor activity, and thought processes Neck - supple, no significant adenopathy Chest - clear to auscultation, no wheezes, rales or rhonchi, symmetric air entry Heart - normal rate, regular rhythm, normal S1, S2, no murmurs, rubs, clicks or gallops Extremities -no lower extremity edema at this time      Latest Ref Rng & Units 12/27/2023    9:38 AM 12/18/2023   10:02 AM 06/18/2023    1:03 PM  CMP  Glucose 65 - 99 mg/dL 035  009  381   BUN 7 - 25 mg/dL 26  15  15    Creatinine 0.50 - 1.05 mg/dL 8.29  9.37  1.69   Sodium 135 - 146 mmol/L 138  143  139   Potassium 3.5 - 5.3 mmol/L 4.2  4.1  4.3   Chloride 98 - 110 mmol/L 99  101  97   CO2 20 - 32 mmol/L 33  30  31   Calcium  8.6 - 10.4 mg/dL 9.4  9.3  9.4   Total Protein 6.1 - 8.1 g/dL 6.7  7.0    Total Bilirubin 0.2 - 1.2 mg/dL 0.6  0.4    Alkaline Phos 44 - 121 IU/L  164    AST 10 - 35 U/L 13  13    ALT 6 - 29 U/L 11  13     Lipid Panel     Component Value Date/Time   CHOL 115 12/27/2023 0938   CHOL 140 12/18/2023 1002   TRIG 99 12/27/2023 0938   HDL 53 12/27/2023 0938   HDL 58 12/18/2023 1002   CHOLHDL 2.2 12/27/2023 0938   VLDL 36 (H) 09/19/2016 1452   LDLCALC 44 12/27/2023 0938    CBC    Component Value Date/Time   WBC 4.6 08/16/2023 1015   WBC 4.6 04/24/2022 1152   RBC 4.81 08/16/2023 1015   RBC 4.29 04/24/2022 1152   HGB 13.7 08/16/2023 1015   HCT 43.2 08/16/2023 1015   PLT 230 08/16/2023 1015   MCV 90 08/16/2023 1015   MCH 28.5 08/16/2023 1015   MCH 28.0  04/24/2022 1152   MCHC 31.7 08/16/2023 1015   MCHC 32.2 04/24/2022 1152   RDW 12.0 08/16/2023 1015   LYMPHSABS 1.9 08/16/2023 1015   MONOABS 0.7 04/01/2022 0132   EOSABS 0.2 08/16/2023 1015   BASOSABS 0.0 08/16/2023 1015    ASSESSMENT AND PLAN: 1. Type 2 diabetes mellitus with diabetic polyneuropathy, with long-term current use of insulin  (HCC) (Primary) 2. Long-term (current) use of injectable non-insulin  antidiabetic drugs Encourage patient to check blood sugars manually twice a day and record readings to take with her when she sees her endocrinologist next week.  She has not had any success with being able to keep the CGM sensor attached to her skin. Continue medications listed above as prescribed by the endocrinologist.  Advised that Ozempic will cause weight loss and can cause some nausea.  3. Hypertension associated with diabetes (HCC) Not at goal. Contine carvedilol  6.25 mg twice a day, Olmesartan - valsartan -amlodipine -HCTZ 40/10/25 mg once a day.  Change hydralazine   to 100 mg in the morning and 50 mg in the evening. - Ambulatory referral to [facility] - hydrALAZINE  (APRESOLINE ) 100 MG tablet; 1 tab PO Q a.m and 1/2 tab PO Q p.m  Dispense: 135 tablet; Refill: 1  4. Hyperlipidemia associated with type 2 diabetes mellitus (HCC) Continue atorvastatin  80 mg daily.  5. Vitamin B12 deficiency - Vitamin B12  Patient was given the opportunity to ask questions.  Patient verbalized understanding of the plan and was able to repeat key elements of the plan.   This documentation was completed using Paediatric nurse.  Any transcriptional errors are unintentional.  Orders Placed This Encounter  Procedures   Vitamin B12   Ambulatory referral to [facility]     Requested Prescriptions   Signed Prescriptions Disp Refills   hydrALAZINE  (APRESOLINE ) 100 MG tablet 135 tablet 1    Sig: 1 tab PO Q a.m and 1/2 tab PO Q p.m    Return  in about 3 months (around 08/01/2024).  Concetta Dee, MD, FACP

## 2024-05-02 ENCOUNTER — Ambulatory Visit: Payer: Self-pay | Admitting: Internal Medicine

## 2024-05-02 LAB — VITAMIN B12: Vitamin B-12: 639 pg/mL (ref 232–1245)

## 2024-05-05 ENCOUNTER — Encounter: Payer: Self-pay | Admitting: Diagnostic Neuroimaging

## 2024-05-05 ENCOUNTER — Ambulatory Visit (INDEPENDENT_AMBULATORY_CARE_PROVIDER_SITE_OTHER): Admitting: Diagnostic Neuroimaging

## 2024-05-05 VITALS — BP 201/86 | HR 73 | Ht 72.0 in | Wt 211.0 lb

## 2024-05-05 DIAGNOSIS — Z794 Long term (current) use of insulin: Secondary | ICD-10-CM | POA: Diagnosis not present

## 2024-05-05 DIAGNOSIS — E1142 Type 2 diabetes mellitus with diabetic polyneuropathy: Secondary | ICD-10-CM | POA: Diagnosis not present

## 2024-05-05 NOTE — Patient Instructions (Addendum)
  COLD FEET / PAINFUL FEET (likely related to diabetic neuropathy and peripheral vascular disease)  - continue treatment for diabetes, hypertension; continue clopidogrel , statin; avoid alcohol  and tobacco  - consider painful neuropathy treatment options: -duloxetine 30-60mg  daily, amitriptyline 25-50mg  at bedtime, gabapentin  600-900mg  three times a day, pregabalin 75-150mg  twice a day -capsaicin cream, lidocaine  patch / cream, alpha-lipoic acid 600mg  daily

## 2024-05-05 NOTE — Progress Notes (Signed)
 [facility]  PATIENT: Katherine [name] DOB: 1955/12/30  REFERRING CLINICIAN: Lawrance Presume, MD HISTORY FROM: patient  REASON FOR VISIT: new consult   HISTORICAL  CHIEF COMPLAINT:  Chief Complaint  Patient presents with   Peripheral Neuropathy    Rm 6 alone Pt is well, reports she has been having bilateral feet burning and tingling for 2 years. Symptoms have progressed up the L leg.     HISTORY OF PRESENT ILLNESS:   68 year old female with hypertension, hyperlipidemia, diabetes, here for evaluation of lower extremity burning and tingling sensation for past 2 years.  Also with history of peripheral vascular disease, toe ulcer, osteomyelitis and amputation of the toe. No symptoms in fingers or hands.     REVIEW OF SYSTEMS: Full 14 system review of systems performed and negative with exception of: as per HPI.  ALLERGIES: Allergies  Allergen Reactions   Other Swelling    Seaweed= swelling on arms, hands and face   Metformin  And Related Nausea Only    HOME MEDICATIONS: Outpatient Medications Prior to Visit  Medication Sig Dispense Refill   atorvastatin  (LIPITOR ) 80 MG tablet TAKE 1 TABLET BY MOUTH DAILY AT 6 PM. 90 tablet 0   BAQSIMI ONE PACK 3 MG/DOSE POWD Place 1 spray into the nose as needed (Low blood sugar).     Blood Pressure KIT Check blood pressure twice a day Dx: I10, hypertension 1 kit 0   carvedilol  (COREG ) 6.25 MG tablet TAKE 1 TABLET BY MOUTH 2 TIMES DAILY WITH A MEAL. 180 tablet 0   clopidogrel  (PLAVIX ) 75 MG tablet TAKE 1 TABLET BY MOUTH EVERY DAY WITH BREAKFAST (Patient taking differently: Take 75 mg by mouth at bedtime.) 90 tablet 3   Continuous Blood Gluc Receiver (FREESTYLE LIBRE 2 READER) DEVI Use as directed 3 times daily 1 each 12   Continuous Blood Gluc Receiver (FREESTYLE LIBRE 2 READER) DEVI Scan as needed for continuous glucose monitoring. 1 each 0   Continuous Blood Gluc Sensor (FREESTYLE LIBRE 2 SENSOR) MISC Change sensor  every 2 weeks. 2 each 12   Continuous Blood Gluc Sensor (FREESTYLE LIBRE 2 SENSOR) MISC Inject 1 sensor to the skin every 14 days for continuous glucose monitoring. 2 each 11   fluticasone  (FLONASE ) 50 MCG/ACT nasal spray 1 SPRAY EACH NOSTRIL DAILY X 1 WEEK THEN AS NEEDED 24 mL 1   gabapentin  (NEURONTIN ) 600 MG tablet Take 1 tablet (600 mg total) by mouth 2 (two) times daily. 60 tablet 6   glucose blood (ONETOUCH VERIO) test strip 1 each by Misc.(Non-Drug; Combo Route) route 4 times daily before meals and nightly. 400 each 0   hydrALAZINE  (APRESOLINE ) 100 MG tablet 1 tab PO Q a.m and 1/2 tab PO Q p.m 135 tablet 1   insulin  aspart (NOVOLOG ) 100 UNIT/ML FlexPen Inject 10-17 units into the skin 3 times daily before meals. (Patient taking differently: Inject 10 Units into the skin 3 (three) times daily with meals.) 45 mL 3   Lancets MISC Please dispense the Single/disposable Lancets.  Check BS 4x/day 100 each 3   loratadine  (CLARITIN ) 10 MG tablet TAKE 1 TABLET BY MOUTH EVERY DAY 90 tablet 1   Multiple Vitamins-Minerals (MULTIVITAMIN ADULT) CHEW Chew 2 tablets by mouth daily.     mupirocin  cream (BACTROBAN ) 2 % Apply topically daily. Apply to left foot wound and cover with dry dressing once daily 30 g 0   Olmesartan -amLODIPine -HCTZ 40-10-25 MG TABS Take 1 tablet by mouth daily. 90 tablet 1  OZEMPIC, 0.25 OR 0.5 MG/DOSE, 2 MG/3ML SOPN Inject 0.5 mg into the skin once a week.     RESTASIS 0.05 % ophthalmic emulsion Place 1 drop into both eyes 2 (two) times daily.     TOUJEO  SOLOSTAR 300 UNIT/ML Solostar Pen Inject 70 Units into the skin daily.     traMADol  (ULTRAM ) 50 MG tablet Take 1 tablet (50 mg total) by mouth every 12 (twelve) hours as needed. 14 tablet 1   TRUEPLUS PEN NEEDLES 31G X 5 MM MISC Use to inject Toujeo  once daily. 100 each 2   VITAMIN D  PO Take 2 tablets by mouth at bedtime.     Carboxymethylcellulose Sodium (EYE DROPS OP) Place 1 drop into both eyes daily. (Patient not taking: Reported  on 05/05/2024)     No facility-administered medications prior to visit.    PAST MEDICAL HISTORY: Past Medical History:  Diagnosis Date   Anemia    requiring transfusion   Boil of buttock ~ 2016   Depression    Diabetic neuropathy (HCC)    Maximo Spar 08/23/2017   DKA (diabetic ketoacidoses)    recent/notes 01/06/2015   Exertional dyspnea 08/15/2023   Hyperlipidemia    Hypertension    Murmur 10/19/2022   Neuropathy    Peripheral arterial disease (HCC)    Peripheral vascular disease (HCC)    Toe ulcer (HCC)    left great toe/notes 08/23/2017   Type II diabetes mellitus (HCC)     PAST SURGICAL HISTORY: Past Surgical History:  Procedure Laterality Date   ABDOMINAL AORTOGRAM W/LOWER EXTREMITY N/A 04/06/2022   Procedure: ABDOMINAL AORTOGRAM W/LOWER EXTREMITY;  Surgeon: Young Hensen, MD;  Location: [facility];  Service: Cardiovascular;  Laterality: N/A;   AMPUTATION TOE Left 10/26/2017   Procedure: AMPUTATION PARTIAL RAY LEFT FOOT AND IRRIGATION/DEBRIDEMENT;  Surgeon: Dot Gazella, DPM;  Location: [facility] OR;  Service: Podiatry;  Laterality: Left;  AMPUTATION PARTIAL RAY LEFT FOOT AND IRRIGATION/DEBRIDEMENT   CHOLECYSTECTOMY  01/24/2003   Maximo Spar 04/04/2011   COLONOSCOPY     INCISION AND DRAINAGE ABSCESS  03/17/2012   right but; left lower abdoment/notes 03/17/2012   LOWER EXTREMITY INTERVENTION N/A 09/10/2017   Procedure: LOWER EXTREMITY INTERVENTION;  Surgeon: Avanell Leigh, MD;  Location: [facility];  Service: Cardiovascular;  Laterality: N/A;   PERIPHERAL VASCULAR ATHERECTOMY  09/10/2017   Procedure: PERIPHERAL VASCULAR ATHERECTOMY;  Surgeon: Avanell Leigh, MD;  Location: [facility] INVASIVE CV LAB;  Service: Cardiovascular;;  left AT   PERIPHERAL VASCULAR BALLOON ANGIOPLASTY Left 04/06/2022   Procedure: PERIPHERAL VASCULAR BALLOON ANGIOPLASTY;  Surgeon: Young Hensen, MD;  Location: [facility];  Service: Cardiovascular;  Laterality: Left;    FAMILY  HISTORY: Family History  Problem Relation Age of Onset   Kidney disease Mother    Hypertension Mother    Diabetes Father    Hypertension Father    Hypertension Paternal Aunt    Diabetes Paternal Aunt    Hypertension Paternal Uncle    Diabetes Paternal Uncle    Cancer Maternal Grandmother    Hypertension Maternal Grandfather    Diabetes Paternal Grandfather    Colon cancer Neg Hx    Breast cancer Neg Hx     SOCIAL HISTORY: Social History   Socioeconomic History   Marital status: Married    Spouse name: Not on file   Number of children: Not on file   Years of education: Not on file   Highest education level: Not on file  Occupational History   Not on file  Tobacco Use   Smoking status: Never    Passive exposure: Never   Smokeless tobacco: Never  Vaping Use   Vaping status: Never Used  Substance and Sexual Activity   Alcohol  use: No   Drug use: No   Sexual activity: Never  Other Topics Concern   Not on file  Social History Narrative   Not on file   Social Drivers of Health   Financial Resource Strain: Low Risk  (04/29/2024)   Overall Financial Resource Strain (CARDIA)    Difficulty of Paying Living Expenses: Not hard at all  Food Insecurity: No Food Insecurity (04/29/2024)   Hunger Vital Sign    Worried About Running Out of Food in the Last Year: Never true    Ran Out of Food in the Last Year: Never true  Transportation Needs: No Transportation Needs (04/29/2024)   PRAPARE - Administrator, Civil Service (Medical): No    Lack of Transportation (Non-Medical): No  Physical Activity: Inactive (04/29/2024)   Exercise Vital Sign    Days of Exercise per Week: 0 days    Minutes of Exercise per Session: 0 min  Stress: No Stress Concern Present (04/29/2024)   [employer] of Occupational Health - Occupational Stress Questionnaire    Feeling of Stress : Not at all  Social Connections: Socially Integrated (04/29/2024)   Social Connection and Isolation  Panel    Frequency of Communication with Friends and Family: Twice a week    Frequency of Social Gatherings with Friends and Family: Twice a week    Attends Religious Services: More than 4 times per year    Active Member of [employer] or Organizations: Yes    Attends Banker Meetings: More than 4 times per year    Marital Status: Married  Catering manager Violence: Not At Risk (04/29/2024)   Humiliation, Afraid, Rape, and Kick questionnaire    Fear of Current or Ex-Partner: No    Emotionally Abused: No    Physically Abused: No    Sexually Abused: No     PHYSICAL EXAM  GENERAL EXAM/CONSTITUTIONAL: Vitals:  Vitals:   05/05/24 1047 05/05/24 1054  BP: (!) 198/84 (!) 201/86  Pulse: 73   Weight: 211 lb (95.7 kg)   Height: 6' (1.829 m)    Body mass index is 28.62 kg/m. Wt Readings from Last 3 Encounters:  05/05/24 211 lb (95.7 kg)  05/01/24 213 lb 6.4 oz (96.8 kg)  04/29/24 218 lb (98.9 kg)   Patient is in no distress; well developed, nourished and groomed; neck is supple  CARDIOVASCULAR: Examination of carotid arteries is normal; no carotid bruits Regular rate and rhythm, no murmurs Examination of peripheral vascular system by observation and palpation is normal  EYES: Ophthalmoscopic exam of optic discs and posterior segments is normal; no papilledema or hemorrhages No results found.  MUSCULOSKELETAL: Gait, strength, tone, movements noted in Neurologic exam below  NEUROLOGIC: MENTAL STATUS:     10/05/2022   12:05 PM 05/15/2022    3:22 PM  MMSE - Mini Mental State Exam  Orientation to time 3 5  Orientation to Place 4 5  Registration 3 3  Attention/ Calculation 5 5  Recall 3 3  Language- name 2 objects 2 2  Language- repeat 1 1  Language- follow 3 step command 3 2  Language- read & follow direction 1 1  Write a sentence 1 1  Copy design 1 1  Total score  27 29   awake, alert, oriented to person, place and time recent and remote memory intact normal  attention and concentration language fluent, comprehension intact, naming intact fund of knowledge appropriate  CRANIAL NERVE:  2nd - no papilledema on fundoscopic exam 2nd, 3rd, 4th, 6th - pupils equal and reactive to light, visual fields full to confrontation, extraocular muscles intact, no nystagmus 5th - facial sensation symmetric 7th - facial strength symmetric 8th - hearing intact 9th - palate elevates symmetrically, uvula midline 11th - shoulder shrug symmetric 12th - tongue protrusion midline  MOTOR:  normal bulk and tone, full strength in the BUE, BLE  SENSORY:  normal and symmetric to light touch, temperature, vibration; EXCEPT DECR IN FEET  COORDINATION:  finger-nose-finger, fine finger movements normal  REFLEXES:  deep tendon reflexes TRACE and symmetric  GAIT/STATION:  narrow based gait; ANTALGIC, SLOW GAIT     DIAGNOSTIC DATA (LABS, IMAGING, TESTING) - I reviewed patient records, labs, notes, testing and imaging myself where available.  Lab Results  Component Value Date   WBC 4.6 08/16/2023   HGB 13.7 08/16/2023   HCT 43.2 08/16/2023   MCV 90 08/16/2023   PLT 230 08/16/2023      Component Value Date/Time   NA 138 12/27/2023 0938   NA 143 12/18/2023 1002   K 4.2 12/27/2023 0938   CL 99 12/27/2023 0938   CO2 33 (H) 12/27/2023 0938   GLUCOSE 228 (H) 12/27/2023 0938   BUN 26 (H) 12/27/2023 0938   BUN 15 12/18/2023 1002   CREATININE 0.96 12/27/2023 0938   CALCIUM  9.4 12/27/2023 0938   PROT 6.7 12/27/2023 0938   PROT 7.0 12/18/2023 1002   ALBUMIN 4.1 12/18/2023 1002   AST 13 12/27/2023 0938   ALT 11 12/27/2023 0938   ALKPHOS 164 (H) 12/18/2023 1002   BILITOT 0.6 12/27/2023 0938   BILITOT 0.4 12/18/2023 1002   GFRNONAA >60 04/01/2022 0132   GFRNONAA >89 01/06/2015 1621   GFRAA 73 12/01/2019 1119   GFRAA >89 01/06/2015 1621   Lab Results  Component Value Date   CHOL 115 12/27/2023   HDL 53 12/27/2023   LDLCALC 44 12/27/2023   TRIG 99  12/27/2023   CHOLHDL 2.2 12/27/2023   Lab Results  Component Value Date   HGBA1C 10.6 (H) 12/27/2023   Lab Results  Component Value Date   VITAMINB12 639 05/01/2024   Lab Results  Component Value Date   TSH 1.200 08/16/2023     ASSESSMENT AND PLAN  68 y.o. year old female here with:   Dx:  1. Diabetic polyneuropathy associated with type 2 diabetes mellitus (HCC)     PLAN:  COLD FEET / PAINFUL FEET (likely related to diabetic neuropathy and peripheral vascular disease)  - continue treatment for diabetes, hypertension; continue clopidogrel , statin; avoid alcohol  and tobacco  - consider painful neuropathy treatment options: -duloxetine 30-60mg  daily, amitriptyline 25-50mg  at bedtime, gabapentin  600-900mg  three times a day, pregabalin 75-150mg  twice a day -capsaicin cream, lidocaine  patch / cream, alpha-lipoic acid 600mg  daily  Return for return to PCP.    Omega Bible, MD 05/05/2024, 11:40 AM Certified in Neurology, Neurophysiology and Neuroimaging  [facility] [address], [address] [place], [place] [address] [phone]

## 2024-05-06 ENCOUNTER — Encounter: Payer: Self-pay | Admitting: "Endocrinology

## 2024-05-06 ENCOUNTER — Ambulatory Visit (INDEPENDENT_AMBULATORY_CARE_PROVIDER_SITE_OTHER): Admitting: "Endocrinology

## 2024-05-06 VITALS — BP 140/90 | HR 91 | Ht 72.0 in | Wt 214.0 lb

## 2024-05-06 DIAGNOSIS — E1165 Type 2 diabetes mellitus with hyperglycemia: Secondary | ICD-10-CM

## 2024-05-06 DIAGNOSIS — E7800 Pure hypercholesterolemia, unspecified: Secondary | ICD-10-CM

## 2024-05-06 DIAGNOSIS — Z7985 Long-term (current) use of injectable non-insulin antidiabetic drugs: Secondary | ICD-10-CM | POA: Diagnosis not present

## 2024-05-06 DIAGNOSIS — Z794 Long term (current) use of insulin: Secondary | ICD-10-CM | POA: Diagnosis not present

## 2024-05-06 LAB — POCT GLYCOSYLATED HEMOGLOBIN (HGB A1C): Hemoglobin A1C: 8.5 % — AB (ref 4.0–5.6)

## 2024-05-06 NOTE — Patient Instructions (Addendum)
 Will recommend the following: Toujeo  80 units every day Novolog  22 untis tidac 15 min before meal Ozempic 0.5 mg/week

## 2024-05-06 NOTE — Progress Notes (Signed)
 Outpatient Endocrinology Note Jorge Newcomer, MD  05/06/24   Katherine [name] 04-08-1956 [id number]  Referring Provider: Lawrance Presume, MD Primary Care Provider: Lawrance Presume, MD Reason for consultation: Subjective   Assessment & Plan  Diagnoses and all orders for this visit:  Uncontrolled type 2 diabetes mellitus with hyperglycemia (HCC) -     POCT glycosylated hemoglobin (Hb A1C)  Long-term (current) use of injectable non-insulin  antidiabetic drugs  Long-term insulin  use (HCC)  Pure hypercholesterolemia   Diabetes Type II complicated by neuropathy, No results found for: GFR Hba1c goal less than 7, current Hba1c is  Lab Results  Component Value Date   HGBA1C 8.5 (A) 05/06/2024   Will recommend the following: Toujeo  80 units every day Novolog  22 untis tidac 15 min before meal Ozempic 0.5 mg/week Cannot adjust meds without log, patient will start libre 3 today Use Skin tac for better sticking   No known contraindications/side effects to any of above medications Glucagon discussed and prescribed with refills on 05/06/24  -Last LD and Tg are as follows: Lab Results  Component Value Date   LDLCALC 44 12/27/2023    Lab Results  Component Value Date   TRIG 99 12/27/2023   -On atorvastatin  80 mg QD -Follow low fat diet and exercise   -Blood pressure goal <140/90 - Microalbumin/creatinine goal is < 30 -Last MA/Cr is as follows: Lab Results  Component Value Date   MICROALBUR 2.7 12/27/2023   -on ACE/ARB  -diet changes including salt restriction -limit eating outside -counseled BP targets per standards of diabetes care -uncontrolled blood pressure can lead to retinopathy, nephropathy and cardiovascular and atherosclerotic heart disease  Reviewed and counseled on: -A1C target -Blood sugar targets -Complications of uncontrolled diabetes  -Checking blood sugar before meals and bedtime and bring log next visit -All medications with mechanism of  action and side effects -Hypoglycemia management: rule of 15's, Glucagon Emergency Kit and medical alert ID -low-carb low-fat plate-method diet -At least 20 minutes of physical activity per day -Annual dilated retinal eye exam and foot exam -compliance and follow up needs -follow up as scheduled or earlier if problem gets worse  Call if blood sugar is less than 70 or consistently above 250    Take a 15 gm snack of carbohydrate at bedtime before you go to sleep if your blood sugar is less than 100.    If you are going to fast after midnight for a test or procedure, ask your physician for instructions on how to reduce/decrease your insulin  dose.    Call if blood sugar is less than 70 or consistently above 250  -Treating a low sugar by rule of 15  (15 gms of sugar every 15 min until sugar is more than 70) If you feel your sugar is low, test your sugar to be sure If your sugar is low (less than 70), then take 15 grams of a fast acting Carbohydrate (3-4 glucose tablets or glucose gel or 4 ounces of juice or regular soda) Recheck your sugar 15 min after treating low to make sure it is more than 70 If sugar is still less than 70, treat again with 15 grams of carbohydrate          Don't drive the hour of hypoglycemia  If unconscious/unable to eat or drink by mouth, use glucagon injection or nasal spray baqsimi and call 911. Can repeat again in 15 min if still unconscious.  Return in about 4 weeks (around 06/03/2024).  I have reviewed current medications, nurse's notes, allergies, vital signs, past medical and surgical history, family medical history, and social history for this encounter. Counseled patient on symptoms, examination findings, lab findings, imaging results, treatment decisions and monitoring and prognosis. The patient understood the recommendations and agrees with the treatment plan. All questions regarding treatment plan were fully answered.  Jorge Newcomer, MD   05/06/24    History of Present Illness Katherine [name] is a 68 y.o. year old female who presents for follow up of Type II diabetes mellitus.  Katherine [name] was first diagnosed in 2011.   Diabetes education +  Home diabetes regimen: Toujeo  80 units every day Novolog  20 untis tidac Ozempic 0.25 mg/week  COMPLICATIONS -  MI/Stroke -  retinopathy +  neuropathy -  nephropathy  BLOOD SUGAR DATA Libre falls off, reinforced skin tac again  BG 88-300 per logbook   Physical Exam  BP (!) 140/90   Pulse 91   Ht 6' (1.829 m)   Wt 214 lb (97.1 kg)   SpO2 95%   BMI 29.02 kg/m    Constitutional: well developed, well nourished Head: normocephalic, atraumatic Eyes: sclera anicteric, no redness Neck: supple Lungs: normal respiratory effort Neurology: alert and oriented Skin: dry, no appreciable rashes Musculoskeletal: no appreciable defects Psychiatric: normal mood and affect Diabetic Foot Exam - Simple   No data filed      Current Medications Patient's Medications  New Prescriptions   No medications on file  Previous Medications   ATORVASTATIN  (LIPITOR ) 80 MG TABLET    TAKE 1 TABLET BY MOUTH DAILY AT 6 PM.   BAQSIMI ONE PACK 3 MG/DOSE POWD    Place 1 spray into the nose as needed (Low blood sugar).   BLOOD PRESSURE KIT    Check blood pressure twice a day Dx: I10, hypertension   CARVEDILOL  (COREG ) 6.25 MG TABLET    TAKE 1 TABLET BY MOUTH 2 TIMES DAILY WITH A MEAL.   CLOPIDOGREL  (PLAVIX ) 75 MG TABLET    TAKE 1 TABLET BY MOUTH EVERY DAY WITH BREAKFAST   CONTINUOUS BLOOD GLUC RECEIVER (FREESTYLE LIBRE 2 READER) DEVI    Use as directed 3 times daily   CONTINUOUS BLOOD GLUC RECEIVER (FREESTYLE LIBRE 2 READER) DEVI    Scan as needed for continuous glucose monitoring.   CONTINUOUS BLOOD GLUC SENSOR (FREESTYLE LIBRE 2 SENSOR) MISC    Change sensor every 2 weeks.   CONTINUOUS BLOOD GLUC SENSOR (FREESTYLE LIBRE 2 SENSOR) MISC    Inject 1 sensor to the skin every 14 days for  continuous glucose monitoring.   FLUTICASONE  (FLONASE ) 50 MCG/ACT NASAL SPRAY    1 SPRAY EACH NOSTRIL DAILY X 1 WEEK THEN AS NEEDED   GABAPENTIN  (NEURONTIN ) 600 MG TABLET    Take 1 tablet (600 mg total) by mouth 2 (two) times daily.   GLUCOSE BLOOD (ONETOUCH VERIO) TEST STRIP    1 each by Misc.(Non-Drug; Combo Route) route 4 times daily before meals and nightly.   HYDRALAZINE  (APRESOLINE ) 100 MG TABLET    1 tab PO Q a.m and 1/2 tab PO Q p.m   INSULIN  ASPART (NOVOLOG ) 100 UNIT/ML FLEXPEN    Inject 10-17 units into the skin 3 times daily before meals.   LANCETS MISC    Please dispense the Single/disposable Lancets.  Check BS 4x/day   LORATADINE  (CLARITIN ) 10 MG TABLET    TAKE 1 TABLET BY MOUTH EVERY DAY   MULTIPLE VITAMINS-MINERALS (MULTIVITAMIN ADULT) CHEW    Chew  2 tablets by mouth daily.   MUPIROCIN  CREAM (BACTROBAN ) 2 %    Apply topically daily. Apply to left foot wound and cover with dry dressing once daily   OLMESARTAN -AMLODIPINE -HCTZ 40-10-25 MG TABS    Take 1 tablet by mouth daily.   OZEMPIC, 0.25 OR 0.5 MG/DOSE, 2 MG/3ML SOPN    Inject 0.5 mg into the skin once a week.   RESTASIS 0.05 % OPHTHALMIC EMULSION    Place 1 drop into both eyes 2 (two) times daily.   TOUJEO  SOLOSTAR 300 UNIT/ML SOLOSTAR PEN    Inject 70 Units into the skin daily.   TRAMADOL  (ULTRAM ) 50 MG TABLET    Take 1 tablet (50 mg total) by mouth every 12 (twelve) hours as needed.   TRUEPLUS PEN NEEDLES 31G X 5 MM MISC    Use to inject Toujeo  once daily.   VITAMIN D  PO    Take 2 tablets by mouth at bedtime.  Modified Medications   No medications on file  Discontinued Medications   No medications on file    Allergies Allergies  Allergen Reactions   Other Swelling    Seaweed= swelling on arms, hands and face   Metformin  And Related Nausea Only    Past Medical History Past Medical History:  Diagnosis Date   Anemia    requiring transfusion   Boil of buttock ~ 2016   Depression    Diabetic neuropathy (HCC)     Maximo Spar 08/23/2017   DKA (diabetic ketoacidoses)    recent/notes 01/06/2015   Exertional dyspnea 08/15/2023   Hyperlipidemia    Hypertension    Murmur 10/19/2022   Neuropathy    Peripheral arterial disease (HCC)    Peripheral vascular disease (HCC)    Toe ulcer (HCC)    left great toe/notes 08/23/2017   Type II diabetes mellitus (HCC)     Past Surgical History Past Surgical History:  Procedure Laterality Date   ABDOMINAL AORTOGRAM W/LOWER EXTREMITY N/A 04/06/2022   Procedure: ABDOMINAL AORTOGRAM W/LOWER EXTREMITY;  Surgeon: Young Hensen, MD;  Location: [facility];  Service: Cardiovascular;  Laterality: N/A;   AMPUTATION TOE Left 10/26/2017   Procedure: AMPUTATION PARTIAL RAY LEFT FOOT AND IRRIGATION/DEBRIDEMENT;  Surgeon: Dot Gazella, DPM;  Location: [facility] OR;  Service: Podiatry;  Laterality: Left;  AMPUTATION PARTIAL RAY LEFT FOOT AND IRRIGATION/DEBRIDEMENT   CHOLECYSTECTOMY  01/24/2003   Maximo Spar 04/04/2011   COLONOSCOPY     INCISION AND DRAINAGE ABSCESS  03/17/2012   right but; left lower abdoment/notes 03/17/2012   LOWER EXTREMITY INTERVENTION N/A 09/10/2017   Procedure: LOWER EXTREMITY INTERVENTION;  Surgeon: Avanell Leigh, MD;  Location: [facility];  Service: Cardiovascular;  Laterality: N/A;   PERIPHERAL VASCULAR ATHERECTOMY  09/10/2017   Procedure: PERIPHERAL VASCULAR ATHERECTOMY;  Surgeon: Avanell Leigh, MD;  Location: [facility];  Service: Cardiovascular;;  left AT   PERIPHERAL VASCULAR BALLOON ANGIOPLASTY Left 04/06/2022   Procedure: PERIPHERAL VASCULAR BALLOON ANGIOPLASTY;  Surgeon: Young Hensen, MD;  Location: [facility];  Service: Cardiovascular;  Laterality: Left;    Family History family history includes Cancer in her maternal grandmother; Diabetes in her father, paternal aunt, paternal grandfather, and paternal uncle; Hypertension in her father, maternal grandfather, mother, paternal aunt, and paternal uncle; Kidney  disease in her mother.  Social History Social History   Socioeconomic History   Marital status: Married    Spouse name: Not on file   Number of children: Not on file  Years of education: Not on file   Highest education level: Not on file  Occupational History   Not on file  Tobacco Use   Smoking status: Never    Passive exposure: Never   Smokeless tobacco: Never  Vaping Use   Vaping status: Never Used  Substance and Sexual Activity   Alcohol  use: No   Drug use: No   Sexual activity: Never  Other Topics Concern   Not on file  Social History Narrative   Not on file   Social Drivers of Health   Financial Resource Strain: Low Risk  (04/29/2024)   Overall Financial Resource Strain (CARDIA)    Difficulty of Paying Living Expenses: Not hard at all  Food Insecurity: No Food Insecurity (04/29/2024)   Hunger Vital Sign    Worried About Running Out of Food in the Last Year: Never true    Ran Out of Food in the Last Year: Never true  Transportation Needs: No Transportation Needs (04/29/2024)   PRAPARE - Administrator, Civil Service (Medical): No    Lack of Transportation (Non-Medical): No  Physical Activity: Inactive (04/29/2024)   Exercise Vital Sign    Days of Exercise per Week: 0 days    Minutes of Exercise per Session: 0 min  Stress: No Stress Concern Present (04/29/2024)   [employer] of Occupational Health - Occupational Stress Questionnaire    Feeling of Stress : Not at all  Social Connections: Socially Integrated (04/29/2024)   Social Connection and Isolation Panel    Frequency of Communication with Friends and Family: Twice a week    Frequency of Social Gatherings with Friends and Family: Twice a week    Attends Religious Services: More than 4 times per year    Active Member of Clubs or Organizations: Yes    Attends Banker Meetings: More than 4 times per year    Marital Status: Married  Catering manager Violence: Not At Risk (04/29/2024)    Humiliation, Afraid, Rape, and Kick questionnaire    Fear of Current or Ex-Partner: No    Emotionally Abused: No    Physically Abused: No    Sexually Abused: No    Lab Results  Component Value Date   HGBA1C 8.5 (A) 05/06/2024   HGBA1C 10.6 (H) 12/27/2023   HGBA1C 10.7 (A) 12/18/2023   Lab Results  Component Value Date   CHOL 115 12/27/2023   Lab Results  Component Value Date   HDL 53 12/27/2023   Lab Results  Component Value Date   LDLCALC 44 12/27/2023   Lab Results  Component Value Date   TRIG 99 12/27/2023   Lab Results  Component Value Date   CHOLHDL 2.2 12/27/2023   Lab Results  Component Value Date   CREATININE 0.96 12/27/2023   No results found for: GFR Lab Results  Component Value Date   MICROALBUR 2.7 12/27/2023      Component Value Date/Time   NA 138 12/27/2023 0938   NA 143 12/18/2023 1002   K 4.2 12/27/2023 0938   CL 99 12/27/2023 0938   CO2 33 (H) 12/27/2023 0938   GLUCOSE 228 (H) 12/27/2023 0938   BUN 26 (H) 12/27/2023 0938   BUN 15 12/18/2023 1002   CREATININE 0.96 12/27/2023 0938   CALCIUM  9.4 12/27/2023 0938   PROT 6.7 12/27/2023 0938   PROT 7.0 12/18/2023 1002   ALBUMIN 4.1 12/18/2023 1002   AST 13 12/27/2023 0938   ALT 11 12/27/2023 0938  ALKPHOS 164 (H) 12/18/2023 1002   BILITOT 0.6 12/27/2023 0938   BILITOT 0.4 12/18/2023 1002   GFRNONAA >60 04/01/2022 0132   GFRNONAA >89 01/06/2015 1621   GFRAA 73 12/01/2019 1119   GFRAA >89 01/06/2015 1621      Latest Ref Rng & Units 12/27/2023    9:38 AM 12/18/2023   10:02 AM 06/18/2023    1:03 PM  BMP  Glucose 65 - 99 mg/dL 161  096  045   BUN 7 - 25 mg/dL 26  15  15    Creatinine 0.50 - 1.05 mg/dL 4.09  8.11  9.14   BUN/Creat Ratio 6 - 22 (calc) 27  18  17    Sodium 135 - 146 mmol/L 138  143  139   Potassium 3.5 - 5.3 mmol/L 4.2  4.1  4.3   Chloride 98 - 110 mmol/L 99  101  97   CO2 20 - 32 mmol/L 33  30  31   Calcium  8.6 - 10.4 mg/dL 9.4  9.3  9.4        Component Value  Date/Time   WBC 4.6 08/16/2023 1015   WBC 4.6 04/24/2022 1152   RBC 4.81 08/16/2023 1015   RBC 4.29 04/24/2022 1152   HGB 13.7 08/16/2023 1015   HCT 43.2 08/16/2023 1015   PLT 230 08/16/2023 1015   MCV 90 08/16/2023 1015   MCH 28.5 08/16/2023 1015   MCH 28.0 04/24/2022 1152   MCHC 31.7 08/16/2023 1015   MCHC 32.2 04/24/2022 1152   RDW 12.0 08/16/2023 1015   LYMPHSABS 1.9 08/16/2023 1015   MONOABS 0.7 04/01/2022 0132   EOSABS 0.2 08/16/2023 1015   BASOSABS 0.0 08/16/2023 1015     Parts of this note may have been dictated using voice recognition software. There may be variances in spelling and vocabulary which are unintentional. Not all errors are proofread. Please notify the [facility] if any discrepancies are noted or if the meaning of any statement is not clear.

## 2024-05-29 ENCOUNTER — Ambulatory Visit (HOSPITAL_BASED_OUTPATIENT_CLINIC_OR_DEPARTMENT_OTHER): Admitting: Family

## 2024-05-29 ENCOUNTER — Encounter (HOSPITAL_BASED_OUTPATIENT_CLINIC_OR_DEPARTMENT_OTHER): Payer: Self-pay | Admitting: Family

## 2024-05-29 VITALS — BP 127/72 | HR 60 | Ht 72.0 in | Wt 218.0 lb

## 2024-05-29 DIAGNOSIS — I701 Atherosclerosis of renal artery: Secondary | ICD-10-CM

## 2024-05-29 DIAGNOSIS — I6523 Occlusion and stenosis of bilateral carotid arteries: Secondary | ICD-10-CM | POA: Diagnosis not present

## 2024-05-29 DIAGNOSIS — I152 Hypertension secondary to endocrine disorders: Secondary | ICD-10-CM

## 2024-05-29 DIAGNOSIS — I10 Essential (primary) hypertension: Secondary | ICD-10-CM

## 2024-05-29 DIAGNOSIS — E1159 Type 2 diabetes mellitus with other circulatory complications: Secondary | ICD-10-CM | POA: Diagnosis not present

## 2024-05-29 DIAGNOSIS — E782 Mixed hyperlipidemia: Secondary | ICD-10-CM

## 2024-05-29 MED ORDER — CARVEDILOL 6.25 MG PO TABS: 6.2500 mg | ORAL_TABLET | Freq: Two times a day (BID) | ORAL | 5 refills | Status: AC

## 2024-05-29 MED ORDER — OLMESARTAN-AMLODIPINE-HCTZ 40-10-25 MG PO TABS: 1.0000 | ORAL_TABLET | Freq: Every day | ORAL | 5 refills | Status: DC

## 2024-05-29 NOTE — Progress Notes (Signed)
 Advanced Hypertension Clinic Assessment:    Date:  05/29/2024   ID:  Katherine [name], DOB 1956/07/22, MRN [id number]  PCP:  Vicci Barnie NOVAK, MD  Cardiologist:  None  Nephrologist:  Referring MD: Vicci Barnie NOVAK, MD   CC: Hypertension  History of Present Illness:    Katherine [name] is a 68 y.o. female with a hx of toe ulcer s/p amputation LT 1st toe, hypertension, hyperlipidemia, type 2 diabetes mellitus with neuropathy, retinopathy and possible gastroparesis, diabetic ketoacidoses, anemia, PAD (carotid and renal stenosis) and depression here to follow up in the [facility]pertension Clinic.   Established with [facility] 09/2022.  Prior peripheral angiography with Dr. Wadie 08/2017 with occluded left anterior tibial and posterior tibial artery with patent peroneal.  He was able to recannulized to the anterior tibial down to her foot.  At initial visit reported home BP elevated.  Lisinopril , carvedilol , amlodipine  were continued.  TSH, cortisol, renin aldosterone were normal.  Renal artery Dopplers 10/2022 bilateral 1-59% stenosis.  Murmur on exam as well as lower extremity edema noted, echo 11/2022 LVEF 60 to 55%, moderate LVH, elevated LVEDP, trivial mitral and aortic regurgitation.  Visit 05/2019 for lower extremity edema persisted and amlodipine  reduced to 5 mg daily, lisinopril  increased.  Last seen 08/14/2023.  Amlodipine , hydrochlorothiazide , olmesartan  combined and Tribenzor.  Lisinopril  and HCTZ were discontinued.  She was referred to pain management due to chronic pain limiting mobility and exercise.  Home sleep study ordered but she returned device without being used. Due to exertional dyspnea Myoview  ordered and performed 08/2019 for low risk study with no ischemia.  Carotid duplex 12/2023 bilateral 1-39% stenosis.  Followed by [facility].  BP at recent clinic visits: 05/06/24 140/90, 05/05/24 201/86, 05/01/24 178/72  Presents today for follow up independently. She does  note when she does more activity she feels palpitations. She does take breaks with activity which helps with this as well as limits her neuropathy pain. Mild exertional dyspnea which is stable compared from previous. No chest pain, pressure, tightness. Notes she prefers 30 day supply of her medications instead of 90 days per her report as she feels it was too many pills at one time.  Reports only taking her Hydralazine  once per day but then taking a 100mg  tablet three times. She reports taking half her meds in the morning before breakfast then the others at nighttime. Feels her medications make her feel drowsy and she needs to be on less medication. Notes feeling more lightheaded over the last month. She is not checking her blood pressure at home. Reports her CBG has always been over 200 and if she gets less than that she feels poorly but is trying to keep it less than 200. Reports her Freestyle Libre only sticks for one day. Reports feeling nauseous which she attributes potentially to Semaglutide. She does eat breakfast with her morning medications.   Dispense report: Hydralazine  100 mg 05/01/24 90-day supply Ozempic 1mg  01/20/24 90 day supply No fills noted of Carvedilol  nor Olmesartan -Amlodipine -hydrochlorothiazide  nor Clopidogrel  Toujeo  04/19/24 60 day supply  Previous antihypertensives: Lisinopril   Past Medical History:  Diagnosis Date   Anemia    requiring transfusion   Boil of buttock ~ 2016   Depression    Diabetic neuropathy (HCC)    thelbert 08/23/2017   DKA (diabetic ketoacidoses)    recent/notes 01/06/2015   Exertional dyspnea 08/15/2023   Hyperlipidemia    Hypertension    Murmur 10/19/2022   Neuropathy    Peripheral  arterial disease (HCC)    Peripheral vascular disease (HCC)    Toe ulcer (HCC)    left great toe/notes 08/23/2017   Type II diabetes mellitus (HCC)     Past Surgical History:  Procedure Laterality Date   ABDOMINAL AORTOGRAM W/LOWER EXTREMITY N/A 04/06/2022    Procedure: ABDOMINAL AORTOGRAM W/LOWER EXTREMITY;  Surgeon: Gretta Lonni PARAS, MD;  Location: [facility];  Service: Cardiovascular;  Laterality: N/A;   AMPUTATION TOE Left 10/26/2017   Procedure: AMPUTATION PARTIAL RAY LEFT FOOT AND IRRIGATION/DEBRIDEMENT;  Surgeon: Janit Thresa HERO, DPM;  Location: [facility] OR;  Service: Podiatry;  Laterality: Left;  AMPUTATION PARTIAL RAY LEFT FOOT AND IRRIGATION/DEBRIDEMENT   CHOLECYSTECTOMY  01/24/2003   thelbert 04/04/2011   COLONOSCOPY     INCISION AND DRAINAGE ABSCESS  03/17/2012   right but; left lower abdoment/notes 03/17/2012   LOWER EXTREMITY INTERVENTION N/A 09/10/2017   Procedure: LOWER EXTREMITY INTERVENTION;  Surgeon: Court Dorn PARAS, MD;  Location: [facility];  Service: Cardiovascular;  Laterality: N/A;   PERIPHERAL VASCULAR ATHERECTOMY  09/10/2017   Procedure: PERIPHERAL VASCULAR ATHERECTOMY;  Surgeon: Court Dorn PARAS, MD;  Location: [facility] INVASIVE CV LAB;  Service: Cardiovascular;;  left AT   PERIPHERAL VASCULAR BALLOON ANGIOPLASTY Left 04/06/2022   Procedure: PERIPHERAL VASCULAR BALLOON ANGIOPLASTY;  Surgeon: Gretta Lonni PARAS, MD;  Location: [facility];  Service: Cardiovascular;  Laterality: Left;    Current Medications: Current Meds  Medication Sig   atorvastatin  (LIPITOR ) 80 MG tablet TAKE 1 TABLET BY MOUTH DAILY AT 6 PM.   BAQSIMI ONE PACK 3 MG/DOSE POWD Place 1 spray into the nose as needed (Low blood sugar).   Blood Pressure KIT Check blood pressure twice a day Dx: I10, hypertension   buprenorphine (BUTRANS) 7.5 MCG/HR 1 patch once a week.   carvedilol  (COREG ) 6.25 MG tablet TAKE 1 TABLET BY MOUTH 2 TIMES DAILY WITH A MEAL.   clopidogrel  (PLAVIX ) 75 MG tablet TAKE 1 TABLET BY MOUTH EVERY DAY WITH BREAKFAST   Continuous Blood Gluc Receiver (FREESTYLE LIBRE 2 READER) DEVI Use as directed 3 times daily   fluticasone  (FLONASE ) 50 MCG/ACT nasal spray 1 SPRAY EACH NOSTRIL DAILY X 1 WEEK THEN AS NEEDED   gabapentin   (NEURONTIN ) 600 MG tablet Take 1 tablet (600 mg total) by mouth 2 (two) times daily.   glucose blood (ONETOUCH VERIO) test strip 1 each by Misc.(Non-Drug; Combo Route) route 4 times daily before meals and nightly.   hydrALAZINE  (APRESOLINE ) 100 MG tablet 1 tab PO Q a.m and 1/2 tab PO Q p.m   HYDROcodone -acetaminophen  (NORCO) 10-325 MG tablet Take 1 tablet by mouth 5 (five) times daily as needed.   insulin  aspart (NOVOLOG ) 100 UNIT/ML FlexPen Inject 10-17 units into the skin 3 times daily before meals.   Lancets MISC Please dispense the Single/disposable Lancets.  Check BS 4x/day   loratadine  (CLARITIN ) 10 MG tablet TAKE 1 TABLET BY MOUTH EVERY DAY   Multiple Vitamins-Minerals (MULTIVITAMIN ADULT) CHEW Chew 2 tablets by mouth daily.   mupirocin  cream (BACTROBAN ) 2 % Apply topically daily. Apply to left foot wound and cover with dry dressing once daily   Olmesartan -amLODIPine -HCTZ 40-10-25 MG TABS Take 1 tablet by mouth daily.   OZEMPIC, 0.25 OR 0.5 MG/DOSE, 2 MG/3ML SOPN Inject 0.5 mg into the skin once a week.   RESTASIS 0.05 % ophthalmic emulsion Place 1 drop into both eyes 2 (two) times daily.   TOUJEO  SOLOSTAR 300 UNIT/ML Solostar Pen Inject 70 Units into the  skin daily.   traMADol  (ULTRAM ) 50 MG tablet Take 1 tablet (50 mg total) by mouth every 12 (twelve) hours as needed.   TRUEPLUS PEN NEEDLES 31G X 5 MM MISC Use to inject Toujeo  once daily.   VITAMIN D  PO Take 2 tablets by mouth at bedtime.   [DISCONTINUED] Continuous Blood Gluc Receiver (FREESTYLE LIBRE 2 READER) DEVI Scan as needed for continuous glucose monitoring.   [DISCONTINUED] Continuous Blood Gluc Sensor (FREESTYLE LIBRE 2 SENSOR) MISC Change sensor every 2 weeks.   [DISCONTINUED] Continuous Blood Gluc Sensor (FREESTYLE LIBRE 2 SENSOR) MISC Inject 1 sensor to the skin every 14 days for continuous glucose monitoring.     Allergies:   Other and Metformin  and related   Social History   Socioeconomic History   Marital status:  Married    Spouse name: Not on file   Number of children: Not on file   Years of education: Not on file   Highest education level: Not on file  Occupational History   Not on file  Tobacco Use   Smoking status: Never    Passive exposure: Never   Smokeless tobacco: Never  Vaping Use   Vaping status: Never Used  Substance and Sexual Activity   Alcohol  use: No   Drug use: No   Sexual activity: Never  Other Topics Concern   Not on file  Social History Narrative   Not on file   Social Drivers of Health   Financial Resource Strain: Low Risk  (04/29/2024)   Overall Financial Resource Strain (CARDIA)    Difficulty of Paying Living Expenses: Not hard at all  Food Insecurity: No Food Insecurity (04/29/2024)   Hunger Vital Sign    Worried About Running Out of Food in the Last Year: Never true    Ran Out of Food in the Last Year: Never true  Transportation Needs: No Transportation Needs (04/29/2024)   PRAPARE - Administrator, Civil Service (Medical): No    Lack of Transportation (Non-Medical): No  Physical Activity: Inactive (04/29/2024)   Exercise Vital Sign    Days of Exercise per Week: 0 days    Minutes of Exercise per Session: 0 min  Stress: No Stress Concern Present (04/29/2024)   [employer] of Occupational Health - Occupational Stress Questionnaire    Feeling of Stress : Not at all  Social Connections: Socially Integrated (04/29/2024)   Social Connection and Isolation Panel    Frequency of Communication with Friends and Family: Twice a week    Frequency of Social Gatherings with Friends and Family: Twice a week    Attends Religious Services: More than 4 times per year    Active Member of [employer] or Organizations: Yes    Attends Engineer, structural: More than 4 times per year    Marital Status: Married     Family History: The patient's family history includes Cancer in her maternal grandmother; Diabetes in her father, paternal aunt, paternal  grandfather, and paternal uncle; Hypertension in her father, maternal grandfather, mother, paternal aunt, and paternal uncle; Kidney disease in her mother. There is no history of Colon cancer or Breast cancer.  ROS:   Please see the history of present illness.     All other systems reviewed and are negative.  EKGs/Labs/Other Studies Reviewed:    EKG Interpretation Date/Time:  Thursday May 29 2024 10:48:44 EDT Ventricular Rate:  61 PR Interval:  176 QRS Duration:  88 QT Interval:  452 QTC Calculation: 455 R  Axis:   21  Text Interpretation: Normal sinus rhythm Normal ECG Confirmed by Vannie Mora ([id number]) on 05/29/2024 10:51:22 AM    Recent Labs: 08/16/2023: Hemoglobin 13.7; Platelets 230; TSH 1.200 12/27/2023: ALT 11; BUN 26; Creat 0.96; Potassium 4.2; Sodium 138   Recent Lipid Panel    Component Value Date/Time   CHOL 115 12/27/2023 0938   CHOL 140 12/18/2023 1002   TRIG 99 12/27/2023 0938   HDL 53 12/27/2023 0938   HDL 58 12/18/2023 1002   CHOLHDL 2.2 12/27/2023 0938   VLDL 36 (H) 09/19/2016 1452   LDLCALC 44 12/27/2023 0938    Physical Exam:   VS:  BP 127/72 (BP Location: Right Arm, Patient Position: Sitting, Cuff Size: Normal)   Pulse 60   Ht 6' (1.829 m)   Wt 218 lb (98.9 kg)   SpO2 97%   BMI 29.57 kg/m  , BMI Body mass index is 29.57 kg/m. GENERAL:  Well appearing HEENT: Pupils equal round and reactive, fundi not visualized, oral mucosa unremarkable NECK:  No jugular venous distention, waveform within normal limits, carotid upstroke brisk and symmetric, no bruits, no thyromegaly LYMPHATICS:  No cervical adenopathy LUNGS:  Clear to auscultation bilaterally HEART:  RRR.  PMI not displaced or sustained,S1 and S2 within normal limits, no S3, no S4, no clicks, no rubs, no murmurs ABD:  Flat, positive bowel sounds normal in frequency in pitch, no bruits, no rebound, no guarding, no midline pulsatile mass, no hepatomegaly, no splenomegaly EXT:  2 plus pulses  throughout, no edema, no cyanosis no clubbing SKIN:  No rashes no nodules NEURO:  Cranial nerves II through XII grossly intact, motor grossly intact throughout PSYCH:  Cognitively intact, oriented to person place and time   ASSESSMENT/PLAN:    HTN - BP at goal <130/80 in clinic today. She reports lightheadedness with no hypotension noted in clinic. She has not been checking BP a home but recently purchased cuff and was instructed howt o use it today. Home cuff found to be accurate. Recommended to continue Coreg  6.25mg  BID, Olmesartan -Amlodipine -hydrochlorothiazide  40-10-25mg  daily, hydralazine  100mg  AM and 50mg  PM. Concern for medication adherence due to inconsistencies in dispense report from pharmacy, as above. Requested she bring pill bottles to next visit. Discussed to monitor BP at home at least 2 hours after medications and sitting for 5-10 minutes.   Carotid stenosis - followed by [facility]  Renal artery stenosis - 2023 duplex bilateral 1-59% stenosis. Update renal artery duplex.  HLD - plan for Lipoprotein a to assess for familial hyperlipidemia  DM2 - 04/2025 A1c 8.5. follows with endocrinology.   Screening for Secondary Hypertension:     10/19/2022   11:23 AM  Causes  Drugs/Herbals Screened     - Comments limits salt.  Does eat out some. Daily coffee x2.No EtOH.  No NSAIDS  Renovascular HTN Screened     - Comments check renal artery Doppler  Sleep Apnea Screened     - Comments Snores, AM fatigue, daytime somnolence.  Check sleep study  Thyroid  Disease Screened     - Comments Check TSh  Hyperaldosteronism Screened     - Comments Check renin and aldosterone  Pheochromocytoma N/A  Cushing's Syndrome Screened     - Comments check AM cortisol    Relevant Labs/Studies:    Latest Ref Rng & Units 12/27/2023    9:38 AM 12/18/2023   10:02 AM 06/18/2023    1:03 PM  Basic Labs  Sodium 135 - 146 mmol/L 138  143  139   Potassium 3.5 - 5.3 mmol/L 4.2  4.1  4.3   Creatinine 0.50 - 1.05  mg/dL 9.03  9.16  9.11        Latest Ref Rng & Units 08/16/2023   10:15 AM 10/31/2022   11:06 AM  Thyroid    TSH 0.450 - 4.500 uIU/mL 1.200  1.120        Latest Ref Rng & Units 10/31/2022   11:06 AM  Renin/Aldosterone   Aldosterone 0.0 - 30.0 ng/dL 6.6   Aldos/Renin Ratio 0.0 - 30.0 26.0              11/15/2022   12:32 PM  Renovascular   Renal Artery US  Completed Yes     Disposition:    FU with MD/APP/PharmD in 6 weeks    Medication Adjustments/Labs and Tests Ordered: Current medicines are reviewed at length with the patient today.  Concerns regarding medicines are outlined above.  Orders Placed This Encounter  Procedures   EKG 12-Lead   No orders of the defined types were placed in this encounter.    Signed, Reche GORMAN Finder, NP  05/29/2024 11:00 AM    [facility]

## 2024-05-29 NOTE — Patient Instructions (Addendum)
 Medication Instructions:   Your current blood pressure medications are prescribed:  Carvedilol  6.25mg  one tablet twice daily This helps to lower your blood pressure, keep your heart muscle strong, it also helps to prevent palpitations or your heart from racing This medication only lasts 12 hours which is why you have to take it twice per day  Olmesartan -Amlodipine -hydrochlorothiazide  40-10-25mg  daily This is a combination tablet to help lower your blood pressure. It includes medications that help lower blood pressure as well as protect your kidneys from the effects of high blood pressure. This medication lasts 24 hours so you only have to take it once per day  Hydralazine  100mg  AM and half tablet (50mg ) PM This is a blood pressure pill that helps to lower blood pressure without affecting your kidneys It only last 8-12 hours which is why it is often prescribed twice per day  Labs: Your physician recommends that you return for lab work today: lipoprotein (a)  Testing/Procedures: Your physician has requested that you have a renal artery duplex. During this test, an ultrasound is used to evaluate blood flow to the kidneys. Allow one hour for this exam. Do not eat after midnight the day before and avoid carbonated beverages. Take your medications as you usually do.    Follow-Up: Please follow up in 6-8 weeks in ADV HTN CLINIC with Dr. Raford, Reche Finder, NP or Allean Mink PharmD    Special Instructions:   Please bring pill bottles to your next office visit.   Check blood pressure ONCE per day at least ONE HOUR after your medications and after sitting to rest for 5-10 minutes. This gets the most accurate measurement of blood pressure.   We will reach out in 2 weeks to check in on your blood pressure.

## 2024-05-31 ENCOUNTER — Encounter (HOSPITAL_BASED_OUTPATIENT_CLINIC_OR_DEPARTMENT_OTHER): Payer: Self-pay

## 2024-05-31 ENCOUNTER — Ambulatory Visit (HOSPITAL_BASED_OUTPATIENT_CLINIC_OR_DEPARTMENT_OTHER): Payer: Self-pay | Admitting: Family

## 2024-05-31 LAB — LIPOPROTEIN A (LPA): Lipoprotein (a): 52.3 nmol/L (ref <75.0)

## 2024-06-05 ENCOUNTER — Ambulatory Visit: Admitting: "Endocrinology

## 2024-06-06 NOTE — Progress Notes (Signed)
 Pt has been made aware of normal result and verbalized understanding.  jw

## 2024-06-13 ENCOUNTER — Other Ambulatory Visit: Payer: Self-pay | Admitting: Internal Medicine

## 2024-06-13 DIAGNOSIS — J069 Acute upper respiratory infection, unspecified: Secondary | ICD-10-CM

## 2024-07-08 ENCOUNTER — Encounter (HOSPITAL_BASED_OUTPATIENT_CLINIC_OR_DEPARTMENT_OTHER)

## 2024-07-14 ENCOUNTER — Encounter (HOSPITAL_BASED_OUTPATIENT_CLINIC_OR_DEPARTMENT_OTHER): Payer: Self-pay

## 2024-07-14 NOTE — Progress Notes (Deleted)
 Office Visit    Patient Name: Katherine [name] Date of Encounter: 07/14/2024  Primary Care Provider:  Vicci Barnie NOVAK, MD Primary Cardiologist:  None  Chief Complaint    Hypertension - Advanced hypertension clinic  Past Medical History   PAD Followed by Dr. Court, recannulized L anterior down to foot  HLD 2/25 LDL 44 on atorvastatin  80  DM2 6/25 A1c 8.5 on Novolog , Toujeo , Ozempic; with retinopathy, neuropathy, poss gastroparesis  CAD Carotid and renal (1-59% bilateral)stenosis       Allergies  Allergen Reactions   Other Swelling    Seaweed= swelling on arms, hands and face   Metformin  And Related Nausea Only    History of Present Illness    Katherine [name] is a 68 y.o. female patient who was referred to the Advanced Hypertension Clinic by Dr. Barnie Vicci, and most recently seen by Reche Finder NP in July.  At that visit her BP was good, 127/72, although there was some concern about compliance.  She was asked to continue with her current prescribed medications and bring the bottles themselves to follow up.  Home BP device was validated in the office, found to be accurate.     Blood Pressure Goal:  130/80  Current Medications: carvedilol  6.25 mg bid, olmesartan /amlodipine /hctz 40/10/25 mg daily, hydralazine  100 mg am/50 mg pm  Medication Dispense History:   Carvedilol  6.25 last filled 90d 09/08/23  Hydralazine  100 mg last filled 90d 05/01/24   Tribenzor 40/10/25 last filled 90d 11/20/23  Previously tried:     Family Hx:     Social Hx:      Tobacco:  Alcohol :  Caffeine:    Diet:      Exercise:   Home BP readings:       Accessory Clinical Findings    Lab Results  Component Value Date   CREATININE 0.96 12/27/2023   BUN 26 (H) 12/27/2023   NA 138 12/27/2023   K 4.2 12/27/2023   CL 99 12/27/2023   CO2 33 (H) 12/27/2023   Lab Results  Component Value Date   ALT 11 12/27/2023   AST 13 12/27/2023   GGT 15 12/18/2023   ALKPHOS 164 (H) 12/18/2023    BILITOT 0.6 12/27/2023   Lab Results  Component Value Date   HGBA1C 8.5 (A) 05/06/2024    Screening for Secondary Hypertension: { Click here to document screening for secondary causes of HTN  :1}     10/19/2022   11:23 AM  Causes  Drugs/Herbals Screened     - Comments limits salt.  Does eat out some. Daily coffee x2.No EtOH.  No NSAIDS  Renovascular HTN Screened     - Comments check renal artery Doppler  Sleep Apnea Screened     - Comments Snores, AM fatigue, daytime somnolence.  Check sleep study  Thyroid  Disease Screened     - Comments Check TSh  Hyperaldosteronism Screened     - Comments Check renin and aldosterone  Pheochromocytoma N/A  Cushing's Syndrome Screened     - Comments check AM cortisol    Relevant Labs/Studies:    Latest Ref Rng & Units 12/27/2023    9:38 AM 12/18/2023   10:02 AM 06/18/2023    1:03 PM  Basic Labs  Sodium 135 - 146 mmol/L 138  143  139   Potassium 3.5 - 5.3 mmol/L 4.2  4.1  4.3   Creatinine 0.50 - 1.05 mg/dL 9.03  9.16  9.11  Latest Ref Rng & Units 08/16/2023   10:15 AM 10/31/2022   11:06 AM  Thyroid    TSH 0.450 - 4.500 uIU/mL 1.200  1.120        Latest Ref Rng & Units 10/31/2022   11:06 AM  Renin/Aldosterone   Aldosterone 0.0 - 30.0 ng/dL 6.6   Aldos/Renin Ratio 0.0 - 30.0 26.0              05/29/2024   11:27 AM  Renovascular   Renal Artery US  Completed Yes      Home Medications    Current Outpatient Medications  Medication Sig Dispense Refill   atorvastatin  (LIPITOR ) 80 MG tablet TAKE 1 TABLET BY MOUTH DAILY AT 6 PM. 90 tablet 0   BAQSIMI ONE PACK 3 MG/DOSE POWD Place 1 spray into the nose as needed (Low blood sugar).     Blood Pressure KIT Check blood pressure twice a day Dx: I10, hypertension 1 kit 0   buprenorphine (BUTRANS) 7.5 MCG/HR 1 patch once a week.     carvedilol  (COREG ) 6.25 MG tablet Take 1 tablet (6.25 mg total) by mouth 2 (two) times daily with a meal. 60 tablet 5   clopidogrel  (PLAVIX ) 75 MG  tablet TAKE 1 TABLET BY MOUTH EVERY DAY WITH BREAKFAST 90 tablet 3   Continuous Blood Gluc Receiver (FREESTYLE LIBRE 2 READER) DEVI Use as directed 3 times daily 1 each 12   fluticasone  (FLONASE ) 50 MCG/ACT nasal spray 1 SPRAY EACH NOSTRIL DAILY X 1 WEEK THEN AS NEEDED 48 mL 1   gabapentin  (NEURONTIN ) 600 MG tablet Take 1 tablet (600 mg total) by mouth 2 (two) times daily. 60 tablet 6   glucose blood (ONETOUCH VERIO) test strip 1 each by Misc.(Non-Drug; Combo Route) route 4 times daily before meals and nightly. 400 each 0   hydrALAZINE  (APRESOLINE ) 100 MG tablet 1 tab PO Q a.m and 1/2 tab PO Q p.m 135 tablet 1   HYDROcodone -acetaminophen  (NORCO) 10-325 MG tablet Take 1 tablet by mouth 5 (five) times daily as needed.     insulin  aspart (NOVOLOG ) 100 UNIT/ML FlexPen Inject 10-17 units into the skin 3 times daily before meals. 45 mL 3   Lancets MISC Please dispense the Single/disposable Lancets.  Check BS 4x/day 100 each 3   loratadine  (CLARITIN ) 10 MG tablet TAKE 1 TABLET BY MOUTH EVERY DAY 90 tablet 1   Multiple Vitamins-Minerals (MULTIVITAMIN ADULT) CHEW Chew 2 tablets by mouth daily.     mupirocin  cream (BACTROBAN ) 2 % Apply topically daily. Apply to left foot wound and cover with dry dressing once daily 30 g 0   Olmesartan -amLODIPine -HCTZ 40-10-25 MG TABS Take 1 tablet by mouth daily. 30 tablet 5   OZEMPIC, 0.25 OR 0.5 MG/DOSE, 2 MG/3ML SOPN Inject 0.5 mg into the skin once a week.     RESTASIS 0.05 % ophthalmic emulsion Place 1 drop into both eyes 2 (two) times daily.     TOUJEO  SOLOSTAR 300 UNIT/ML Solostar Pen Inject 70 Units into the skin daily.     traMADol  (ULTRAM ) 50 MG tablet Take 1 tablet (50 mg total) by mouth every 12 (twelve) hours as needed. 14 tablet 1   TRUEPLUS PEN NEEDLES 31G X 5 MM MISC Use to inject Toujeo  once daily. 100 each 2   VITAMIN D  PO Take 2 tablets by mouth at bedtime.     No current facility-administered medications for this visit.     Assessment & Plan   No  BP recorded.  {Refresh  Note OR Click here to enter BP  :1}***   No problem-specific Assessment & Plan notes found for this encounter.   Lianette Broussard PharmD CPP [facility]  [address] [place], [place] [address] [phone]

## 2024-07-15 ENCOUNTER — Encounter (HOSPITAL_BASED_OUTPATIENT_CLINIC_OR_DEPARTMENT_OTHER): Payer: Self-pay | Admitting: Family

## 2024-07-15 ENCOUNTER — Ambulatory Visit (HOSPITAL_BASED_OUTPATIENT_CLINIC_OR_DEPARTMENT_OTHER): Admitting: Pharmacist Clinician (PhC)/ Clinical Pharmacy Specialist

## 2024-08-04 ENCOUNTER — Telehealth: Payer: Self-pay | Admitting: Internal Medicine

## 2024-08-04 NOTE — Telephone Encounter (Signed)
 LVM to confirm asptp 9/16

## 2024-08-05 ENCOUNTER — Encounter: Payer: Self-pay | Admitting: Internal Medicine

## 2024-08-05 ENCOUNTER — Ambulatory Visit: Attending: Internal Medicine | Admitting: Internal Medicine

## 2024-08-05 ENCOUNTER — Other Ambulatory Visit: Payer: Self-pay | Admitting: Internal Medicine

## 2024-08-05 VITALS — BP 129/71 | HR 69 | Ht 72.0 in | Wt 213.0 lb

## 2024-08-05 DIAGNOSIS — E1142 Type 2 diabetes mellitus with diabetic polyneuropathy: Secondary | ICD-10-CM | POA: Diagnosis not present

## 2024-08-05 DIAGNOSIS — Z7985 Long-term (current) use of injectable non-insulin antidiabetic drugs: Secondary | ICD-10-CM | POA: Diagnosis not present

## 2024-08-05 DIAGNOSIS — E1159 Type 2 diabetes mellitus with other circulatory complications: Secondary | ICD-10-CM | POA: Diagnosis not present

## 2024-08-05 DIAGNOSIS — I152 Hypertension secondary to endocrine disorders: Secondary | ICD-10-CM

## 2024-08-05 DIAGNOSIS — E1169 Type 2 diabetes mellitus with other specified complication: Secondary | ICD-10-CM

## 2024-08-05 DIAGNOSIS — Z794 Long term (current) use of insulin: Secondary | ICD-10-CM | POA: Diagnosis not present

## 2024-08-05 DIAGNOSIS — E785 Hyperlipidemia, unspecified: Secondary | ICD-10-CM

## 2024-08-05 DIAGNOSIS — Z23 Encounter for immunization: Secondary | ICD-10-CM

## 2024-08-05 DIAGNOSIS — Z79899 Other long term (current) drug therapy: Secondary | ICD-10-CM

## 2024-08-05 DIAGNOSIS — Z89422 Acquired absence of other left toe(s): Secondary | ICD-10-CM

## 2024-08-05 LAB — GLUCOSE, POCT (MANUAL RESULT ENTRY): POC Glucose: 133 mg/dL — AB (ref 70–99)

## 2024-08-05 LAB — POCT GLYCOSYLATED HEMOGLOBIN (HGB A1C): HbA1c, POC (controlled diabetic range): 8.6 % — AB (ref 0.0–7.0)

## 2024-08-05 MED ORDER — ONETOUCH VERIO VI STRP: ORAL_STRIP | 0 refills | Status: DC

## 2024-08-05 MED ORDER — ACCU-CHEK GUIDE TEST VI STRP: ORAL_STRIP | 6 refills | Status: AC

## 2024-08-05 MED ORDER — ACCU-CHEK GUIDE W/DEVICE KIT: PACK | 0 refills | Status: AC

## 2024-08-05 MED ORDER — ATORVASTATIN CALCIUM 80 MG PO TABS: 80.0000 mg | ORAL_TABLET | Freq: Every day | ORAL | 1 refills | Status: AC

## 2024-08-05 MED ORDER — ACCU-CHEK SOFTCLIX LANCETS MISC: 6 refills | Status: AC

## 2024-08-05 MED ORDER — CLOPIDOGREL BISULFATE 75 MG PO TABS: 75.0000 mg | ORAL_TABLET | Freq: Every day | ORAL | 3 refills | Status: AC

## 2024-08-05 MED ORDER — HYDRALAZINE HCL 100 MG PO TABS: ORAL_TABLET | ORAL | Status: DC

## 2024-08-05 NOTE — Progress Notes (Signed)
 Patient ID: Katherine [name], female    DOB: 11-14-1956  MRN: [id number]  CC: Diabetes (DM f/u. Med refill. /No questions / concerns/Yes to flu vax)   Subjective: Katherine [name] is a 68 y.o. female who presents for chronic ds management. Her concerns today include:  Hx of HTN, DM with neuropathy, retinopathy and possible gastroparesis, HL, depression, GERD, PAD ( left anterior tibial angioplasty 03/2022 for left fifth toe osteomyelitis), s/p amputation LT 1st toe, BL RAS 59%.    Discussed the use of AI scribe software for clinical note transcription with the patient, who gave verbal consent to proceed.  History of Present Illness Katherine [name] is a 68 year old female with hypertension, hyperlipidemia, and diabetes who presents for follow-up of her chronic conditions.  HTN: She is managing hypertension with carvedilol  6.25 mg twice daily, a combination pill of olmesartan /hydrochlorothiazide /amlodipine  40-25-10 once daily, and hydralazine  100 mg twice daily (should be 100 mg a.m and 50 mg p.m). She is making dietary changes by limiting salt intake and occasionally checks her blood pressure.  HL: she is on atorvastatin  80 mg daily and confirms taking. Her LDL cholesterol was last checked in February and was 44 mg/dL, which is below the target of less than 70 mg/dL. A recent lipoprotein A level was normal.  DM: Results for orders placed or performed in visit on 08/05/24  POCT glucose (manual entry)   Collection Time: 08/05/24  9:57 AM  Result Value Ref Range   POC Glucose 133 (A) 70 - 99 mg/dl  POCT glycosylated hemoglobin (Hb A1C)   Collection Time: 08/05/24 10:01 AM  Result Value Ref Range   Hemoglobin A1C     HbA1c POC (<> result, manual entry)     HbA1c, POC (prediabetic range)     HbA1c, POC (controlled diabetic range) 8.6 (A) 0.0 - 7.0 %  Last saw her endocrinologist Dr. Darrol in June and A1C was 8.5% and is now 8.6%. She is on Toujeo  80 mg daily in the morning, Novolog  30 units  with meals (should be 22 units), and Ozempic 0.5 mg once weekly on Tuesdays. She has not had blood glucose readings over 200 mg/dL in the past two months, though she did not bring her continuous glucose monitor to the appointment. She has difficulty with her eating habits, often skipping meals because she cannot find anything she wants to eat. She enjoys pasta, particularly with broccoli, but struggles to find suitable dietary options. She also mentions not drinking as much water as she should.    Patient Active Problem List   Diagnosis Date Noted   Exertional dyspnea 08/15/2023   Mild major depression (HCC) 11/24/2022   Murmur 10/19/2022   Medication monitoring encounter 04/10/2022   Cellulitis of left lower extremity    Neuropathy 12/08/2021   Diabetic gastroparesis associated with type 2 diabetes mellitus (HCC) 07/30/2021   Type 2 diabetes mellitus with diabetic peripheral angiopathy without gangrene, with long-term current use of insulin  (HCC) 07/30/2021   Amputation of left great toe (HCC) 06/13/2021   Microalbuminuria due to type 2 diabetes mellitus (HCC) 05/04/2021   Diabetes (HCC) 12/22/2019   Renal artery stenosis (HCC) 12/01/2019   Diabetic polyneuropathy associated with type 2 diabetes mellitus (HCC) 05/14/2018   Thyroid  nodule 10/04/2017   Microcytic anemia 09/11/2017   Retinopathy 08/22/2017   Thyroid  enlargement 07/12/2017   Dyslipidemia 09/20/2016   DEPRESSION 09/10/2009   LEG CRAMPS 05/19/2009   GERD 02/15/2009   Gastroparesis 09/03/2008   Dental  caries 07/14/2008   Essential hypertension 09/16/2003     Current Outpatient Medications on File Prior to Visit  Medication Sig Dispense Refill   BAQSIMI ONE PACK 3 MG/DOSE POWD Place 1 spray into the nose as needed (Low blood sugar).     Blood Pressure KIT Check blood pressure twice a day Dx: I10, hypertension 1 kit 0   carvedilol  (COREG ) 6.25 MG tablet Take 1 tablet (6.25 mg total) by mouth 2 (two) times daily with a  meal. 60 tablet 5   Continuous Blood Gluc Receiver (FREESTYLE LIBRE 2 READER) DEVI Use as directed 3 times daily 1 each 12   gabapentin  (NEURONTIN ) 600 MG tablet Take 1 tablet (600 mg total) by mouth 2 (two) times daily. 60 tablet 6   HYDROcodone -acetaminophen  (NORCO) 10-325 MG tablet Take 1 tablet by mouth 5 (five) times daily as needed.     insulin  aspart (NOVOLOG ) 100 UNIT/ML FlexPen Inject 10-17 units into the skin 3 times daily before meals. 45 mL 3   Lancets MISC Please dispense the Single/disposable Lancets.  Check BS 4x/day 100 each 3   loratadine  (CLARITIN ) 10 MG tablet TAKE 1 TABLET BY MOUTH EVERY DAY 90 tablet 1   Multiple Vitamins-Minerals (MULTIVITAMIN ADULT) CHEW Chew 2 tablets by mouth daily.     mupirocin  cream (BACTROBAN ) 2 % Apply topically daily. Apply to left foot wound and cover with dry dressing once daily 30 g 0   Olmesartan -amLODIPine -HCTZ 40-10-25 MG TABS Take 1 tablet by mouth daily. 30 tablet 5   OZEMPIC, 0.25 OR 0.5 MG/DOSE, 2 MG/3ML SOPN Inject 0.5 mg into the skin once a week.     RESTASIS 0.05 % ophthalmic emulsion Place 1 drop into both eyes 2 (two) times daily.     TOUJEO  SOLOSTAR 300 UNIT/ML Solostar Pen Inject 70 Units into the skin daily.     TRUEPLUS PEN NEEDLES 31G X 5 MM MISC Use to inject Toujeo  once daily. 100 each 2   VITAMIN D  PO Take 2 tablets by mouth at bedtime.     buprenorphine (BUTRANS) 7.5 MCG/HR 1 patch once a week. (Patient not taking: Reported on 08/05/2024)     fluticasone  (FLONASE ) 50 MCG/ACT nasal spray 1 SPRAY EACH NOSTRIL DAILY X 1 WEEK THEN AS NEEDED (Patient not taking: Reported on 08/05/2024) 48 mL 1   No current facility-administered medications on file prior to visit.    Allergies  Allergen Reactions   Other Swelling    Seaweed= swelling on arms, hands and face   Metformin  And Related Nausea Only    Social History   Socioeconomic History   Marital status: Married    Spouse name: Not on file   Number of children: Not on file    Years of education: Not on file   Highest education level: Not on file  Occupational History   Not on file  Tobacco Use   Smoking status: Never    Passive exposure: Never   Smokeless tobacco: Never  Vaping Use   Vaping status: Never Used  Substance and Sexual Activity   Alcohol  use: No   Drug use: No   Sexual activity: Never  Other Topics Concern   Not on file  Social History Narrative   Not on file   Social Drivers of Health   Financial Resource Strain: Low Risk  (04/29/2024)   Overall Financial Resource Strain (CARDIA)    Difficulty of Paying Living Expenses: Not hard at all  Food Insecurity: No Food Insecurity (04/29/2024)  Hunger Vital Sign    Worried About Running Out of Food in the Last Year: Never true    Ran Out of Food in the Last Year: Never true  Transportation Needs: No Transportation Needs (04/29/2024)   PRAPARE - Administrator, Civil Service (Medical): No    Lack of Transportation (Non-Medical): No  Physical Activity: Inactive (04/29/2024)   Exercise Vital Sign    Days of Exercise per Week: 0 days    Minutes of Exercise per Session: 0 min  Stress: No Stress Concern Present (04/29/2024)   [employer] of Occupational Health - Occupational Stress Questionnaire    Feeling of Stress : Not at all  Social Connections: Socially Integrated (04/29/2024)   Social Connection and Isolation Panel    Frequency of Communication with Friends and Family: Twice a week    Frequency of Social Gatherings with Friends and Family: Twice a week    Attends Religious Services: More than 4 times per year    Active Member of [employer] or Organizations: Yes    Attends Banker Meetings: More than 4 times per year    Marital Status: Married  Catering manager Violence: Not At Risk (04/29/2024)   Humiliation, Afraid, Rape, and Kick questionnaire    Fear of Current or Ex-Partner: No    Emotionally Abused: No    Physically Abused: No    Sexually Abused: No     Family History  Problem Relation Age of Onset   Kidney disease Mother    Hypertension Mother    Diabetes Father    Hypertension Father    Hypertension Paternal Aunt    Diabetes Paternal Aunt    Hypertension Paternal Uncle    Diabetes Paternal Uncle    Cancer Maternal Grandmother    Hypertension Maternal Grandfather    Diabetes Paternal Grandfather    Colon cancer Neg Hx    Breast cancer Neg Hx     Past Surgical History:  Procedure Laterality Date   ABDOMINAL AORTOGRAM W/LOWER EXTREMITY N/A 04/06/2022   Procedure: ABDOMINAL AORTOGRAM W/LOWER EXTREMITY;  Surgeon: Gretta Lonni PARAS, MD;  Location: [facility];  Service: Cardiovascular;  Laterality: N/A;   AMPUTATION TOE Left 10/26/2017   Procedure: AMPUTATION PARTIAL RAY LEFT FOOT AND IRRIGATION/DEBRIDEMENT;  Surgeon: Janit Thresa HERO, DPM;  Location: [facility] OR;  Service: Podiatry;  Laterality: Left;  AMPUTATION PARTIAL RAY LEFT FOOT AND IRRIGATION/DEBRIDEMENT   CHOLECYSTECTOMY  01/24/2003   thelbert 04/04/2011   COLONOSCOPY     INCISION AND DRAINAGE ABSCESS  03/17/2012   right but; left lower abdoment/notes 03/17/2012   LOWER EXTREMITY INTERVENTION N/A 09/10/2017   Procedure: LOWER EXTREMITY INTERVENTION;  Surgeon: Court Dorn PARAS, MD;  Location: [facility];  Service: Cardiovascular;  Laterality: N/A;   PERIPHERAL VASCULAR ATHERECTOMY  09/10/2017   Procedure: PERIPHERAL VASCULAR ATHERECTOMY;  Surgeon: Court Dorn PARAS, MD;  Location: [facility];  Service: Cardiovascular;;  left AT   PERIPHERAL VASCULAR BALLOON ANGIOPLASTY Left 04/06/2022   Procedure: PERIPHERAL VASCULAR BALLOON ANGIOPLASTY;  Surgeon: Gretta Lonni PARAS, MD;  Location: [facility];  Service: Cardiovascular;  Laterality: Left;    ROS: Review of Systems Negative except as stated above  PHYSICAL EXAM: BP 129/71   Pulse 69   Ht 6' (1.829 m)   Wt 213 lb (96.6 kg)   SpO2 100%   BMI 28.89 kg/m   Wt Readings from Last 3  Encounters:  08/05/24 213 lb (96.6 kg)  05/29/24 218  lb (98.9 kg)  05/06/24 214 lb (97.1 kg)    Physical Exam  General appearance - alert, well appearing, older AAF and in no distress Mental status - normal mood, behavior, speech, dress, motor activity, and thought processes Chest - clear to auscultation, no wheezes, rales or rhonchi, symmetric air entry Heart - normal rate, regular rhythm, normal S1, S2, no murmurs, rubs, clicks or gallops Extremities - no LE edema      Latest Ref Rng & Units 12/27/2023    9:38 AM 12/18/2023   10:02 AM 06/18/2023    1:03 PM  CMP  Glucose 65 - 99 mg/dL 771  801  637   BUN 7 - 25 mg/dL 26  15  15    Creatinine 0.50 - 1.05 mg/dL 9.03  9.16  9.11   Sodium 135 - 146 mmol/L 138  143  139   Potassium 3.5 - 5.3 mmol/L 4.2  4.1  4.3   Chloride 98 - 110 mmol/L 99  101  97   CO2 20 - 32 mmol/L 33  30  31   Calcium  8.6 - 10.4 mg/dL 9.4  9.3  9.4   Total Protein 6.1 - 8.1 g/dL 6.7  7.0    Total Bilirubin 0.2 - 1.2 mg/dL 0.6  0.4    Alkaline Phos 44 - 121 IU/L  164    AST 10 - 35 U/L 13  13    ALT 6 - 29 U/L 11  13     Lipid Panel     Component Value Date/Time   CHOL 115 12/27/2023 0938   CHOL 140 12/18/2023 1002   TRIG 99 12/27/2023 0938   HDL 53 12/27/2023 0938   HDL 58 12/18/2023 1002   CHOLHDL 2.2 12/27/2023 0938   VLDL 36 (H) 09/19/2016 1452   LDLCALC 44 12/27/2023 0938    CBC    Component Value Date/Time   WBC 4.6 08/16/2023 1015   WBC 4.6 04/24/2022 1152   RBC 4.81 08/16/2023 1015   RBC 4.29 04/24/2022 1152   HGB 13.7 08/16/2023 1015   HCT 43.2 08/16/2023 1015   PLT 230 08/16/2023 1015   MCV 90 08/16/2023 1015   MCH 28.5 08/16/2023 1015   MCH 28.0 04/24/2022 1152   MCHC 31.7 08/16/2023 1015   MCHC 32.2 04/24/2022 1152   RDW 12.0 08/16/2023 1015   LYMPHSABS 1.9 08/16/2023 1015   MONOABS 0.7 04/01/2022 0132   EOSABS 0.2 08/16/2023 1015   BASOSABS 0.0 08/16/2023 1015    ASSESSMENT AND PLAN: 1. Type 2 diabetes mellitus with  diabetic polyneuropathy, with long-term current use of insulin  (HCC) (Primary) Patient forgot to bring her continuous glucose monitor with her.  Because of this no changes made.  She will continue Toujeo  80 units daily, NovoLog  insulin  30 units with meals and Ozempic 0.5 mL grams once a week.  She has an appointment with endocrinology in early October which she will confirm with their office today.  Strongly advised that she carries continuous glucose monitor reader with her to that visit. Encouraged healthy eating habits.  She is agreeable to see the nutritionist. - POCT glucose (manual entry) - glucose blood (ONETOUCH VERIO) test strip; 1 each by Misc.(Non-Drug; Combo Route) route 4 times daily before meals and nightly.  Dispense: 400 each; Refill: 0 - POCT glycosylated hemoglobin (Hb A1C) - Amb ref to Medical Nutrition Therapy-MNT  2. Long-term (current) use of injectable non-insulin  antidiabetic drugs See #1 above  3. Hypertension associated with diabetes (HCC) At  goal.  Continue hydralazine .  I have changed the hydralazine  fact that she is taking 100 mg twice a day.  Continue carvedilol  6.25 mg twice a day and Olm/Norvasc /hydrochlorothiazide  40/10/25 daily. - hydrALAZINE  (APRESOLINE ) 100 MG tablet; 1 tab PO Q a.m and 1 tab PO Q p.m  4. Hyperlipidemia associated with type 2 diabetes mellitus (HCC) At goal. Continue Lipitor  - atorvastatin  (LIPITOR ) 80 MG tablet; Take 1 tablet (80 mg total) by mouth daily.  Dispense: 90 tablet; Refill: 1  5. Need for influenza vaccination Given today   Patient was given the opportunity to ask questions.  Patient verbalized understanding of the plan and was able to repeat key elements of the plan.   This documentation was completed using Paediatric nurse.  Any transcriptional errors are unintentional.  Orders Placed This Encounter  Procedures   Amb ref to Medical Nutrition Therapy-MNT   POCT glucose (manual entry)   POCT  glycosylated hemoglobin (Hb A1C)     Requested Prescriptions   Signed Prescriptions Disp Refills   atorvastatin  (LIPITOR ) 80 MG tablet 90 tablet 1    Sig: Take 1 tablet (80 mg total) by mouth daily.   glucose blood (ONETOUCH VERIO) test strip 400 each 0    Sig: 1 each by Misc.(Non-Drug; Combo Route) route 4 times daily before meals and nightly.   clopidogrel  (PLAVIX ) 75 MG tablet 90 tablet 3    Sig: Take 1 tablet (75 mg total) by mouth daily.   hydrALAZINE  (APRESOLINE ) 100 MG tablet      Sig: 1 tab PO Q a.m and 1 tab PO Q p.m    Return in about 4 months (around 12/05/2024).  Barnie Louder, MD, FACP

## 2024-08-05 NOTE — Patient Instructions (Signed)
 VISIT SUMMARY:  Today, you had a follow-up appointment to review your chronic conditions, including hypertension, hyperlipidemia, and diabetes. We discussed your current medications, recent lab results, and dietary habits.  YOUR PLAN:  -TYPE 2 DIABETES MELLITUS WITH DIABETIC POLYNEUROPATHY: Your A1c level, which measures your average blood sugar over the past 3 months, has slightly increased to 8.6% from 8.5% in June, but it has significantly improved from 14% last October. This means your blood sugar control has improved overall, but there's still room for better management. You should continue taking Toujeo , Novolog , and Ozempic as prescribed. We will refer you to a dietitian to help with your dietary choices, and please remember to bring your continuous glucose monitor to future appointments. Your goal is to get your A1c below 7%.  -HYPERTENSION: Your blood pressure is well-controlled with your current medications. We will continue your hydralazine  at 100 mg twice daily. Please keep limiting your salt intake and check your blood pressure weekly to ensure it remains under control.  -HYPERLIPIDEMIA: Your cholesterol levels are well-controlled, with your LDL cholesterol at 44 mg/dL, which is below the target of less than 70 mg/dL. Continue taking atorvastatin  80 mg daily.  -GENERAL HEALTH MAINTENANCE: You are due for a flu shot, which we will administer during today's visit. Keeping up with vaccinations is an important part of maintaining your overall health.  INSTRUCTIONS:  Please follow up with the endocrinologist on October 2nd as scheduled. Additionally, make sure to bring your continuous glucose monitor to your future appointments.

## 2024-08-14 ENCOUNTER — Encounter (HOSPITAL_BASED_OUTPATIENT_CLINIC_OR_DEPARTMENT_OTHER): Admitting: Family

## 2024-09-02 ENCOUNTER — Other Ambulatory Visit: Payer: Self-pay | Admitting: Internal Medicine

## 2024-09-02 DIAGNOSIS — E1159 Type 2 diabetes mellitus with other circulatory complications: Secondary | ICD-10-CM

## 2024-09-09 ENCOUNTER — Encounter: Payer: Self-pay | Admitting: Skilled Nursing Facility1

## 2024-09-09 ENCOUNTER — Encounter: Attending: Internal Medicine | Admitting: Skilled Nursing Facility1

## 2024-09-09 VITALS — Wt 215.0 lb

## 2024-09-09 DIAGNOSIS — Z794 Long term (current) use of insulin: Secondary | ICD-10-CM | POA: Diagnosis present

## 2024-09-09 DIAGNOSIS — E1151 Type 2 diabetes mellitus with diabetic peripheral angiopathy without gangrene: Secondary | ICD-10-CM | POA: Insufficient documentation

## 2024-09-09 NOTE — Progress Notes (Signed)
 A1C 8.6  Pt states she wants to lose weight but finds it difficult due to making changes to her diet.  Pt states she has not had a good appetite stating if she does not have the foods she wants then she will not eat at all. Pt states she still takes her insulin  even when she has not eaten.  Pt states the CGMs do not stay on her arm not lasting even one day. Pt states she has not been checking her blood sugars until more recently due to not getting blood but has found the single use lancets get blood but has only checked her sugar once in the last week.   Pt states her support is me, myself and I stating her husband requires her care and her children live in [place]  stating her children want them to move to [place] .   Pt states she is in a lot of pain resulting in feeling overwhelmed and burn out.   Pt reports feeling Hopeless since bout 5-6 months ago stating she has not been able to work, she is in severe pain and her DM is out of control. Pt states she feels pressure from her medical providers to have better control over her blood sugars.  Dietitian used concise simple language with elicited teach back to ensure pt understood the education: pt understood the education up to a point but with emotional understanding creating a barrier to intellectual understanding   Pt states she stopped injecting into her stomach because it was making her stomach fat: Dietitian educated pt on this topic  Pt states she will knowingly take her meal time insulin  with not having eaten: Dietitian educated pt on this topic  Pt states she needs lower blood sugar and better controlled A1C with lows being better than sating she HAS to have her sugars under control: Dietitian educated pt on this topic  DM medications: Toujeo : 80 units in the morning  Novolog : 30 units with each meal Ozempic  Goals: Only take your meal time insulin  if you are going to eat Start work with a  therapist; [address] right across from your  husbands appt today would be a great place to start  Remember you got your A1C from 14 to 8.6, you are doing fantastic!!   Diabetes Self-Management Education  Visit Type: Follow-up  Appt. Start Time: 9:06 Appt. End Time: 10:15  09/09/2024  [name], identified by name and date of birth, is a 68 y.o. female with a diagnosis of Diabetes:  .   ASSESSMENT  Weight 215 lb (97.5 kg). Body mass index is 29.16 kg/m.   Diabetes Self-Management Education - 09/09/24 1046       Visit Information   Visit Type Follow-up      Health Coping   How would you rate your overall health? Very Poor      Psychosocial Assessment   Patient Belief/Attitude about Diabetes Defeat/Burnout    What is the hardest part about your diabetes right now, causing you the most concern, or is the most worrisome to you about your diabetes?   Making healty food and beverage choices;Checking blood sugar    Self-care barriers Debilitated state due to current medical condition    Self-management support None    Patient Concerns Glycemic Control    Special Needs Simplified materials    Preferred Learning Style Visual;Hands on    Learning Readiness Not Ready    How often do you need to have someone help you  when you read instructions, pamphlets, or other written materials from your doctor or pharmacy? 1 - Never      Pre-Education Assessment   Patient understands the diabetes disease and treatment process. Needs Instruction    Patient understands incorporating nutritional management into lifestyle. Needs Instruction    Patient undertands incorporating physical activity into lifestyle. Needs Instruction    Patient understands using medications safely. Needs Instruction    Patient understands monitoring blood glucose, interpreting and using results Needs Instruction    Patient understands prevention, detection, and treatment of acute complications. Needs Instruction    Patient understands prevention, detection,  and treatment of chronic complications. Needs Instruction    Patient understands how to develop strategies to address psychosocial issues. Needs Instruction    Patient understands how to develop strategies to promote health/change behavior. Needs Instruction      Complications   Last HgB A1C per patient/outside source 8.6 %    How often do you check your blood sugar? 0 times/day (not testing)      Activity / Exercise   Activity / Exercise Type ADL's    How many days per week do you exercise? 0    How many minutes per day do you exercise? 0    Total minutes per week of exercise 0      Patient Education   Previous Diabetes Education Yes    Disease Pathophysiology Factors that contribute to the development of diabetes    Medications Taught/reviewed insulin /injectables, injection, site rotation, insulin /injectables storage and needle disposal.;Reviewed patients medication for diabetes, action, purpose, timing of dose and side effects.;Reviewed medication adjustment guidelines for hyperglycemia and sick days.    Monitoring Taught/evaluated SMBG meter.;Taught/evaluated CGM (comment);Purpose and frequency of SMBG.;Interpreting lab values - A1C, lipid, urine microalbumina.;Daily foot exams;Yearly dilated eye exam;Identified appropriate SMBG and/or A1C goals.    Acute complications Taught prevention, symptoms, and  treatment of hypoglycemia - the 15 rule.    Chronic complications Relationship between chronic complications and blood glucose control;Assessed and discussed foot care and prevention of foot problems;Retinopathy and reason for yearly dilated eye exams;Dental care;Nephropathy, what it is, prevention of, the use of ACE, ARB's and early detection of through urine microalbumia.    Diabetes Stress and Support Identified and addressed patients feelings and concerns about diabetes;Worked with patient to identify barriers to care and solutions;Role of stress on diabetes;Helped patient identify a  support system for diabetes management;Brainstormed with patient on coping mechanisms for social situations, getting support from significant others, dealing with feelings about diabetes    Lifestyle and Health Coping Lifestyle issues that need to be addressed for better diabetes care      Individualized Goals (developed by patient)   Medications take my medication as prescribed    Monitoring  Test my blood glucose as discussed;Test blood glucose pre and post meals as discussed    Problem Solving Eating Pattern;Addressing barriers to behavior change      Patient Self-Evaluation of Goals - Patient rates self as meeting previously set goals (% of time)   Nutrition < 25% (hardly ever/never)    Physical Activity < 25% (hardly ever/never)    Medications < 25% (hardly ever/never)    Monitoring < 25% (hardly ever/never)    Problem Solving and behavior change strategies  < 25% (hardly ever/never)    Reducing Risk (treating acute and chronic complications) < 25% (hardly ever/never)    Health Coping < 25% (hardly ever/never)      Post-Education Assessment   Patient understands  the diabetes disease and treatment process. Needs Review    Patient understands incorporating nutritional management into lifestyle. Needs Review    Patient undertands incorporating physical activity into lifestyle. Needs Review    Patient understands using medications safely. Needs Review    Patient understands monitoring blood glucose, interpreting and using results Needs Review    Patient understands prevention, detection, and treatment of acute complications. Needs Review    Patient understands prevention, detection, and treatment of chronic complications. Needs Review    Patient understands how to develop strategies to address psychosocial issues. Needs Review    Patient understands how to develop strategies to promote health/change behavior. Needs Review      Outcomes   Expected Outcomes Demonstrated limited interest  in learning.  Expect minimal changes    Future DMSE 2 wks    Program Status Completed      Subsequent Visit   Since your last visit have you continued or begun to take your medications as prescribed? No    Since your last visit, are you checking your blood glucose at least once a day? No          Individualized Plan for Diabetes Self-Management Training:   Learning Objective:  Patient will have a greater understanding of diabetes self-management. Patient education plan is to attend individual and/or group sessions per assessed needs and concerns.   Expected Outcomes:  Demonstrated limited interest in learning.  Expect minimal changes  Education material provided: Support group flyer  If problems or questions, patient to contact team via:  Phone and Email  Future DSME appointment: 2 wks

## 2024-09-10 ENCOUNTER — Other Ambulatory Visit: Payer: Self-pay | Admitting: Internal Medicine

## 2024-09-10 DIAGNOSIS — Z1231 Encounter for screening mammogram for malignant neoplasm of breast: Secondary | ICD-10-CM

## 2024-09-13 ENCOUNTER — Telehealth: Payer: Self-pay | Admitting: Internal Medicine

## 2024-09-13 DIAGNOSIS — E1142 Type 2 diabetes mellitus with diabetic polyneuropathy: Secondary | ICD-10-CM

## 2024-09-13 NOTE — Telephone Encounter (Signed)
 Let patient know that I have received message from the dietitian about possible depressed mood.  Please let me know if she would like me to refer her to a behavioral health counselor.

## 2024-09-13 NOTE — Telephone Encounter (Signed)
-----   Message from Norberto Rouse B sent at 09/09/2024 10:38 AM EDT ----- Good Morning,   I saw Ms. Mcgraw today and wanted you to be aware of the conversation we had today.  She spoke of feeling Hopeless and knowing taking her Novolog  without eating will drop her sugar. I specifically asked her if she was trying to kill herself, her response was vague so after over 40 minutes of discussion she states she wants lower blood sugars faster. It was tough to tell if it was a cognitive/understanding issue or a mental health issue. She refused going to [address] and said she MIGHT go see a therapist in the future.   I do not think she will attempt suicide but I am not confident she will stop taking her meal time insulin  without eating. I have her coming in to see me in 2 weeks and I gave her 100 one time use lancets because she has not been checking her blood sugars so this will hopefully create one less barrier.   I plan on advising she work with a therapist at our next visit as well.    Thank you, Rouse Norberto RD, CDCES

## 2024-09-16 NOTE — Telephone Encounter (Signed)
 Called but no answer. LVM to call back.

## 2024-09-17 NOTE — Telephone Encounter (Signed)
 Called & spoke to the patient. Verified name & DOB. Informed patient that Dr.Johnson received a message about possible depressive mood and inquired if she would like a referral to [facility]. Patient is upset because she informed the dietician that she is not depressed but was / is experiencing pain on both legs. She explained to the dietician that there is a difference in what she is experiencing and it is not depression but an understanding was not reached. Patient does not want to return to the dietician and is requesting to see another dietician at another location. Please advise if another referral can be sent.

## 2024-09-17 NOTE — Telephone Encounter (Signed)
 Net referral submitted.

## 2024-09-24 ENCOUNTER — Encounter: Admitting: Skilled Nursing Facility1

## 2024-10-07 ENCOUNTER — Ambulatory Visit (INDEPENDENT_AMBULATORY_CARE_PROVIDER_SITE_OTHER): Admitting: Podiatry

## 2024-10-07 VITALS — Ht 72.0 in | Wt 215.0 lb

## 2024-10-07 DIAGNOSIS — E1142 Type 2 diabetes mellitus with diabetic polyneuropathy: Secondary | ICD-10-CM

## 2024-10-07 DIAGNOSIS — S90821A Blister (nonthermal), right foot, initial encounter: Secondary | ICD-10-CM | POA: Diagnosis not present

## 2024-10-07 DIAGNOSIS — Z0189 Encounter for other specified special examinations: Secondary | ICD-10-CM

## 2024-10-07 DIAGNOSIS — M79676 Pain in unspecified toe(s): Secondary | ICD-10-CM

## 2024-10-07 DIAGNOSIS — E119 Type 2 diabetes mellitus without complications: Secondary | ICD-10-CM | POA: Diagnosis not present

## 2024-10-07 DIAGNOSIS — B351 Tinea unguium: Secondary | ICD-10-CM

## 2024-10-08 MED ORDER — MUPIROCIN 2 % EX OINT: 1.0000 | TOPICAL_OINTMENT | Freq: Two times a day (BID) | CUTANEOUS | 2 refills | Status: DC

## 2024-10-08 NOTE — Progress Notes (Signed)
  Subjective:  Patient ID: Katherine [name], female    DOB: 1956/08/01,  MRN: [id number]  Chief Complaint  Patient presents with   Diabetes    Rm 1 DFC    68 y.o. female presents with the above complaint. History confirmed with patient.  She developed a blister on the plantar right foot that she would have evaluated.  The nails are thick and elongated causing pain and discomfort in shoe gear.  Reports good blood sugar control, last A1c 8.6% which is an improvement from her from over 10 a few years ago.  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  Healing blood blister plantar mid arch right foot   Assessment:   1. Pain due to onychomycosis of toenail   2. Blister of plantar aspect of right foot, initial encounter   3. Encounter for diabetic foot exam (HCC)   4. Type 2 diabetes mellitus with polyneuropathy (HCC)      Plan:  Patient was evaluated and treated and all questions answered.  Patient educated on diabetes. Discussed proper diabetic foot care and discussed risks and complications of disease. Educated patient in depth on reasons to return to the office immediately should he/she discover anything concerning or new on the feet. All questions answered. Discussed proper shoes as well.   Discussed the etiology and treatment options for the condition in detail with the patient. Recommended debridement of the nails today. Sharp and mechanical debridement performed of all painful and mycotic nails today. Nails debrided in length and thickness using a nail nipper to level of comfort. Follow up as needed for painful nails.  Regarding the blood blister.  It appears to be healing well there are no signs of infection, no active drainage does not need bandaging.  May utilize mupirocin  ointment topically on  this until it heals.  A refill of this was sent to pharmacy.  Advised to monitor for signs symptoms of infection or worsening and return to see me ASAP if this develops.  Return in about 4 months (around 02/04/2025) for at risk diabetic foot care.

## 2024-10-14 ENCOUNTER — Ambulatory Visit
Admission: RE | Admit: 2024-10-14 | Discharge: 2024-10-14 | Disposition: A | Source: Ambulatory Visit | Attending: Internal Medicine | Admitting: Internal Medicine

## 2024-10-14 DIAGNOSIS — Z1231 Encounter for screening mammogram for malignant neoplasm of breast: Secondary | ICD-10-CM

## 2024-10-17 ENCOUNTER — Other Ambulatory Visit: Payer: Self-pay

## 2024-10-17 MED ORDER — MUPIROCIN 2 % EX OINT: 1.0000 | TOPICAL_OINTMENT | Freq: Two times a day (BID) | CUTANEOUS | 2 refills | Status: AC

## 2024-10-17 NOTE — Progress Notes (Signed)
 Re-order of mupirocin  ointment placed. She is to continue utilizing as previously discussed with Dr. Silva, DPM  Katherine [name],DPM

## 2024-10-19 ENCOUNTER — Ambulatory Visit: Payer: Self-pay | Admitting: Internal Medicine

## 2024-11-24 ENCOUNTER — Telehealth: Payer: Self-pay | Admitting: Internal Medicine

## 2024-11-24 NOTE — Telephone Encounter (Signed)
 Copied from CRM #[id number]. Topic: Clinical - Medication Question >> Nov 24, 2024  2:49 PM Sophia H wrote:  Reason for CRM: Patient was calling in to let provider know that [facility] will be sending over a request for an RX for patient to start receiving her Freestyle sensors via mail order. Please be on look out.

## 2024-11-24 NOTE — Telephone Encounter (Addendum)
 Awaiting of the fax.

## 2024-11-26 NOTE — Telephone Encounter (Signed)
 Called but no answer. LVM informing that the fax has not been received as of yet. Instructed to call [facility] and request for the fax to be sent.

## 2024-11-27 ENCOUNTER — Telehealth: Payer: Self-pay

## 2024-11-27 NOTE — Telephone Encounter (Signed)
 Called & spoke to the patient. Verified name & DOB. Informed that the fax has not been received as of yet and to contact [facility] to have them re-fax it. Patient expressed verbal understanding.

## 2024-11-27 NOTE — Telephone Encounter (Signed)
 Fax received from [facility] and placed in provider box for completion.   Dr.Johnson please advise when this has been completed. Thank you in advance.

## 2024-11-27 NOTE — Telephone Encounter (Signed)
 noted

## 2024-12-07 ENCOUNTER — Other Ambulatory Visit: Payer: Self-pay | Admitting: Internal Medicine

## 2024-12-09 ENCOUNTER — Encounter: Payer: Self-pay | Admitting: Internal Medicine

## 2024-12-09 ENCOUNTER — Ambulatory Visit: Attending: Internal Medicine | Admitting: Internal Medicine

## 2024-12-09 VITALS — BP 169/73 | HR 67 | Ht 72.0 in | Wt 220.0 lb

## 2024-12-09 DIAGNOSIS — Z794 Long term (current) use of insulin: Secondary | ICD-10-CM

## 2024-12-09 DIAGNOSIS — Z7985 Long-term (current) use of injectable non-insulin antidiabetic drugs: Secondary | ICD-10-CM | POA: Diagnosis not present

## 2024-12-09 DIAGNOSIS — I10 Essential (primary) hypertension: Secondary | ICD-10-CM | POA: Diagnosis not present

## 2024-12-09 DIAGNOSIS — E1169 Type 2 diabetes mellitus with other specified complication: Secondary | ICD-10-CM

## 2024-12-09 DIAGNOSIS — Z23 Encounter for immunization: Secondary | ICD-10-CM

## 2024-12-09 DIAGNOSIS — E785 Hyperlipidemia, unspecified: Secondary | ICD-10-CM | POA: Diagnosis not present

## 2024-12-09 DIAGNOSIS — E1159 Type 2 diabetes mellitus with other circulatory complications: Secondary | ICD-10-CM | POA: Diagnosis not present

## 2024-12-09 DIAGNOSIS — E1140 Type 2 diabetes mellitus with diabetic neuropathy, unspecified: Secondary | ICD-10-CM

## 2024-12-09 LAB — POCT GLYCOSYLATED HEMOGLOBIN (HGB A1C): HbA1c, POC (controlled diabetic range): 10.9 % — AB (ref 0.0–7.0)

## 2024-12-09 LAB — GLUCOSE, POCT (MANUAL RESULT ENTRY): POC Glucose: 194 mg/dL — AB (ref 70–99)

## 2024-12-09 MED ORDER — OLMESARTAN-AMLODIPINE-HCTZ 40-10-25 MG PO TABS: 1.0000 | ORAL_TABLET | Freq: Every day | ORAL | 1 refills | Status: AC

## 2024-12-09 MED ORDER — TOUJEO SOLOSTAR 300 UNIT/ML ~~LOC~~ SOPN: 80.0000 [IU] | PEN_INJECTOR | Freq: Every day | SUBCUTANEOUS | 5 refills | Status: AC

## 2024-12-09 MED ORDER — INSULIN ASPART 100 UNIT/ML FLEXPEN: 30.0000 [IU] | PEN_INJECTOR | Freq: Three times a day (TID) | SUBCUTANEOUS | 3 refills | Status: AC

## 2024-12-09 MED ORDER — GABAPENTIN 600 MG PO TABS: 600.0000 mg | ORAL_TABLET | Freq: Three times a day (TID) | ORAL | 1 refills | Status: AC

## 2024-12-09 MED ORDER — SEMAGLUTIDE (1 MG/DOSE) 4 MG/3ML ~~LOC~~ SOPN: 1.0000 mg | PEN_INJECTOR | SUBCUTANEOUS | 4 refills | Status: AC

## 2024-12-09 NOTE — Patient Instructions (Signed)
" °  VISIT SUMMARY: During your visit, we discussed your diabetes, hypertension, and pain management. We reviewed your current medications and made some adjustments to help improve your health. We also talked about the importance of monitoring your blood sugar and blood pressure regularly.  YOUR PLAN: -TYPE 2 DIABETES MELLITUS WITH DIABETIC NEUROPATHY AND OTHER CIRCULATORY COMPLICATIONS: Type 2 diabetes is a condition where your body does not use insulin  properly, leading to high blood sugar levels. Diabetic neuropathy is nerve damage caused by high blood sugar. Continue Toujeo  to 80 units daily, continued your Novolog  at 30 units three times a day. We have increased Ozempic  to 1 mg weekly. Please watch out and report any abdominal pain, severe diarrhea/constipation, vomiting or heart racing with the increase dose of Ozempic . We also increased your gabapentin  to 600 mg three times a day for neuropathy pain and referred you to the [facility]. Please use the single lances for blood sugar monitoring and avoid sugary snacks and drinks.  -HYPERTENSION: Hypertension is high blood pressure, which can lead to serious health problems if not managed. Your blood pressure was 163/74 mmHg, which is above the target of 130/80 mmHg. We continued your current medications: carvedilol  6.25 mg twice daily, hydralazine  100 mg twice daily, and olmesartan /amlodipine /hydrochlorothiazide  40/10/25 mg once daily. Please check your blood pressure twice a week and record the readings. We scheduled a follow-up with the clinical pharmacist in six weeks.  -LONG-TERM USE OF INJECTABLE NON-INSULIN  ANTIDIABETIC DRUGS: You are using Toujeo  and Novolog  to manage your diabetes. We sent a refill for your Novolog  at 30 units three times a day. Please ensure you have enough medication and continue with your current regimen.  INSTRUCTIONS: Please follow up with your endocrinologist and the [facility] as scheduled.  Check your blood pressure twice a week and record the readings. We will see you again in six weeks for a follow-up with the clinical pharmacist.    Contains text generated by [employer].   "

## 2024-12-09 NOTE — Progress Notes (Signed)
 "   Patient ID: Katherine [name], female    DOB: Sep 10, 1956  MRN: [id number]  CC: Diabetes (DM f/u./Yes to flu vax)   Subjective: Katherine [name] is a 69 y.o. female who presents for chronic ds management. Her chronic medical issues include:  Hx of HTN, DM with neuropathy, retinopathy and possible gastroparesis, HL, depression, GERD, PAD ( left anterior tibial angioplasty 03/2022 for left fifth toe osteomyelitis), s/p amputation LT 1st toe, BL RAS 59%   Discussed the use of AI scribe software for clinical note transcription with the patient, who gave verbal consent to proceed.  History of Present Illness Katherine [name] is a 69 year old female with diabetes, hypertension, and hyperlipidemia who presents for follow-up.  DM: Results for orders placed or performed in visit on 12/09/24  POCT glucose (manual entry)   Collection Time: 12/09/24  8:45 AM  Result Value Ref Range   POC Glucose 194 (A) 70 - 99 mg/dl  POCT glycosylated hemoglobin (Hb A1C)   Collection Time: 12/09/24  8:48 AM  Result Value Ref Range   Hemoglobin A1C     HbA1c POC (<> result, manual entry)     HbA1c, POC (prediabetic range)     HbA1c, POC (controlled diabetic range) 10.9 (A) 0.0 - 7.0 %  Her last hemoglobin A1c was 10.9 with a blood glucose level of 194 mg/dL. She has not had a follow-up with her endocrinologist Dr. Dartha since 04/2024. She is currently on Toujeo  80 mg daily, Novolog  30 units three times a day, and Ozempic  0.5 mg and confirms taking consistently. She is running low on Novolog  and has inconsistent blood sugar readings due to difficulty obtaining blood when she pricks finger. She recently received single lances to assist with this issue which she plans to start using. She does not have a continuous glucose monitor due to insurance issues but expects to receive one soon since changing insurance carrier. She experiences nocturia. She reports a decreased appetite and consumes iced coffee with creamer and about  two sodas a week. She used to drink more water but now struggles to consume enough. She limits salt intake and does not enjoy salty foods.  She has painful diabetic neuropathy in legs and feet. She was previously prescribed a low dose of hydrocodone  for pain through [facility].  She was told by them on her last visit in December that unless they become her PCP they will no longer be doing pain management for her. She declined to do that so they told her they will no longer see her. She is currently taking gabapentin  600 mg twice a day, sometimes three times, for diabetic neuropathy. She struggles with swallowing the large tablets and wonders if the 600 mg, capsule form.  HTN: She has a home blood pressure machine but has not been checking regularly. She is on carvedilol  6.25 mg twice a day, hydralazine  100 mg twice a day, and a combination pill of olmesartan , amlodipine , and hydrochlorothiazide  40/10/25 mg once a day. She has not taken her blood pressure medication yet today.  No LE edema in 1 mth.  HL: She is on atorvastatin  80 mg daily and confirms taking.      Patient Active Problem List   Diagnosis Date Noted   Exertional dyspnea 08/15/2023   Mild major depression 11/24/2022   Murmur 10/19/2022   Medication monitoring encounter 04/10/2022   Cellulitis of left lower extremity    Neuropathy 12/08/2021   Diabetic gastroparesis associated with type 2  diabetes mellitus (HCC) 07/30/2021   Type 2 diabetes mellitus with diabetic peripheral angiopathy without gangrene, with long-term current use of insulin  (HCC) 07/30/2021   Amputation of left great toe 06/13/2021   Microalbuminuria due to type 2 diabetes mellitus (HCC) 05/04/2021   Diabetes (HCC) 12/22/2019   Renal artery stenosis 12/01/2019   Diabetic polyneuropathy associated with type 2 diabetes mellitus (HCC) 05/14/2018   Thyroid  nodule 10/04/2017   Microcytic anemia 09/11/2017   Retinopathy 08/22/2017   Thyroid  enlargement  07/12/2017   Dyslipidemia 09/20/2016   DEPRESSION 09/10/2009   LEG CRAMPS 05/19/2009   GERD 02/15/2009   Gastroparesis 09/03/2008   Dental caries 07/14/2008   Essential hypertension 09/16/2003     Medications Ordered Prior to Encounter[1]  Allergies[2]  Social History   Socioeconomic History   Marital status: Married    Spouse name: Not on file   Number of children: Not on file   Years of education: Not on file   Highest education level: Not on file  Occupational History   Not on file  Tobacco Use   Smoking status: Never    Passive exposure: Never   Smokeless tobacco: Never  Vaping Use   Vaping status: Never Used  Substance and Sexual Activity   Alcohol  use: No   Drug use: No   Sexual activity: Never  Other Topics Concern   Not on file  Social History Narrative   Not on file   Social Drivers of Health   Tobacco Use: Low Risk (12/09/2024)   Patient History    Smoking Tobacco Use: Never    Smokeless Tobacco Use: Never    Passive Exposure: Never  Financial Resource Strain: Low Risk (04/29/2024)   Overall Financial Resource Strain (CARDIA)    Difficulty of Paying Living Expenses: Not hard at all  Food Insecurity: No Food Insecurity (04/29/2024)   Hunger Vital Sign    Worried About Running Out of Food in the Last Year: Never true    Ran Out of Food in the Last Year: Never true  Transportation Needs: No Transportation Needs (04/29/2024)   PRAPARE - Administrator, Civil Service (Medical): No    Lack of Transportation (Non-Medical): No  Physical Activity: Inactive (04/29/2024)   Exercise Vital Sign    Days of Exercise per Week: 0 days    Minutes of Exercise per Session: 0 min  Stress: No Stress Concern Present (04/29/2024)   [employer] of Occupational Health - Occupational Stress Questionnaire    Feeling of Stress : Not at all  Social Connections: Socially Integrated (04/29/2024)   Social Connection and Isolation Panel    Frequency of  Communication with Friends and Family: Twice a week    Frequency of Social Gatherings with Friends and Family: Twice a week    Attends Religious Services: More than 4 times per year    Active Member of [employer] or Organizations: Yes    Attends Banker Meetings: More than 4 times per year    Marital Status: Married  Catering Manager Violence: Not At Risk (04/29/2024)   Humiliation, Afraid, Rape, and Kick questionnaire    Fear of Current or Ex-Partner: No    Emotionally Abused: No    Physically Abused: No    Sexually Abused: No  Depression (PHQ2-9): Medium Risk (05/01/2024)   Depression (PHQ2-9)    PHQ-2 Score: 6  Alcohol  Screen: Low Risk (04/29/2024)   Alcohol  Screen    Last Alcohol  Screening Score (AUDIT): 0  Housing: Low Risk (  04/29/2024)   Housing Stability Vital Sign    Unable to Pay for Housing in the Last Year: No    Number of Times Moved in the Last Year: 0    Homeless in the Last Year: No  Utilities: Not At Risk (04/29/2024)   AHC Utilities    Threatened with loss of utilities: No  Health Literacy: Adequate Health Literacy (04/29/2024)   B1300 Health Literacy    Frequency of need for help with medical instructions: Rarely    Family History  Problem Relation Age of Onset   Kidney disease Mother    Hypertension Mother    Diabetes Father    Hypertension Father    Hypertension Paternal Aunt    Diabetes Paternal Aunt    Hypertension Paternal Uncle    Diabetes Paternal Uncle    Cancer Maternal Grandmother    Hypertension Maternal Grandfather    Diabetes Paternal Grandfather    Colon cancer Neg Hx    Breast cancer Neg Hx     Past Surgical History:  Procedure Laterality Date   ABDOMINAL AORTOGRAM W/LOWER EXTREMITY N/A 04/06/2022   Procedure: ABDOMINAL AORTOGRAM W/LOWER EXTREMITY;  Surgeon: Gretta Lonni PARAS, MD;  Location: [facility];  Service: Cardiovascular;  Laterality: N/A;   AMPUTATION TOE Left 10/26/2017   Procedure: AMPUTATION PARTIAL RAY LEFT  FOOT AND IRRIGATION/DEBRIDEMENT;  Surgeon: Janit Thresa HERO, DPM;  Location: [facility] OR;  Service: Podiatry;  Laterality: Left;  AMPUTATION PARTIAL RAY LEFT FOOT AND IRRIGATION/DEBRIDEMENT   CHOLECYSTECTOMY  01/24/2003   thelbert 04/04/2011   COLONOSCOPY     INCISION AND DRAINAGE ABSCESS  03/17/2012   right but; left lower abdoment/notes 03/17/2012   LOWER EXTREMITY INTERVENTION N/A 09/10/2017   Procedure: LOWER EXTREMITY INTERVENTION;  Surgeon: Court Dorn PARAS, MD;  Location: [facility];  Service: Cardiovascular;  Laterality: N/A;   PERIPHERAL VASCULAR ATHERECTOMY  09/10/2017   Procedure: PERIPHERAL VASCULAR ATHERECTOMY;  Surgeon: Court Dorn PARAS, MD;  Location[facility];  Service: Cardiovascular;;  left AT   PERIPHERAL VASCULAR BALLOON ANGIOPLASTY Left 04/06/2022   Procedure: PERIPHERAL VASCULAR BALLOON ANGIOPLASTY;  Surgeon: Gretta Lonni PARAS, MD;  Location: [facility];  Service: Cardiovascular;  Laterality: Left;    ROS: Review of Systems Negative except as stated above  PHYSICAL EXAM: BP (!) 169/73   Pulse 67   Ht 6' (1.829 m)   Wt 220 lb (99.8 kg)   SpO2 99%   BMI 29.84 kg/m   Physical Exam  General appearance - alert, well appearing, older AAF and in no distress Mental status - normal mood, behavior, speech, dress, motor activity, and thought processes Chest - clear to auscultation, no wheezes, rales or rhonchi, symmetric air entry Heart - normal rate, regular rhythm, normal S1, S2, no murmurs, rubs, clicks or gallops Extremities - trace ankle edema      Latest Ref Rng & Units 12/27/2023    9:38 AM 12/18/2023   10:02 AM 06/18/2023    1:03 PM  CMP  Glucose 65 - 99 mg/dL 771  801  637   BUN 7 - 25 mg/dL 26  15  15    Creatinine 0.50 - 1.05 mg/dL 9.03  9.16  9.11   Sodium 135 - 146 mmol/L 138  143  139   Potassium 3.5 - 5.3 mmol/L 4.2  4.1  4.3   Chloride 98 - 110 mmol/L 99  101  97   CO2 20 - 32 mmol/L 33  30  31  Calcium  8.6 - 10.4 mg/dL 9.4   9.3  9.4   Total Protein 6.1 - 8.1 g/dL 6.7  7.0    Total Bilirubin 0.2 - 1.2 mg/dL 0.6  0.4    Alkaline Phos 44 - 121 IU/L  164    AST 10 - 35 U/L 13  13    ALT 6 - 29 U/L 11  13     Lipid Panel     Component Value Date/Time   CHOL 115 12/27/2023 0938   CHOL 140 12/18/2023 1002   TRIG 99 12/27/2023 0938   HDL 53 12/27/2023 0938   HDL 58 12/18/2023 1002   CHOLHDL 2.2 12/27/2023 0938   VLDL 36 (H) 09/19/2016 1452   LDLCALC 44 12/27/2023 0938    CBC    Component Value Date/Time   WBC 4.6 08/16/2023 1015   WBC 4.6 04/24/2022 1152   RBC 4.81 08/16/2023 1015   RBC 4.29 04/24/2022 1152   HGB 13.7 08/16/2023 1015   HCT 43.2 08/16/2023 1015   PLT 230 08/16/2023 1015   MCV 90 08/16/2023 1015   MCH 28.5 08/16/2023 1015   MCH 28.0 04/24/2022 1152   MCHC 31.7 08/16/2023 1015   MCHC 32.2 04/24/2022 1152   RDW 12.0 08/16/2023 1015   LYMPHSABS 1.9 08/16/2023 1015   MONOABS 0.7 04/01/2022 0132   EOSABS 0.2 08/16/2023 1015   BASOSABS 0.0 08/16/2023 1015    ASSESSMENT AND PLAN: 1. Type 2 diabetes mellitus with other specified complication, with long-term current use of insulin  (HCC) (Primary) Not at goal. Dietary counseling given. Patient does not have any blood sugar readings to inform decisions about medication change.  However we have decided to have her continue NovoLog  30 units 3 times a day with meals, Toujeo  80 mg once a day.  We discussed increasing the Ozempic  to 1 mg once a week.  She is currently tolerating the Ozempic  and denies any significant side effects like severe diarrhea, vomiting or abdominal pain. Advised that if she develops any vomiting, abdominal pain, severe diarrhea, palpitations, she should stop the medicine and be seen. -Advised to stop downstairs to the endocrinology office today before she leaves to request follow-up appointment with Dr. Dartha. - CBC - Comprehensive metabolic panel with GFR - Lipid panel - POCT glucose (manual entry) - POCT  glycosylated hemoglobin (Hb A1C) - Microalbumin / creatinine urine ratio - insulin  aspart (NOVOLOG ) 100 UNIT/ML FlexPen; Inject 30 Units into the skin 3 (three) times daily with meals.  Dispense: 45 mL; Refill: 3 - TOUJEO  SOLOSTAR 300 UNIT/ML Solostar Pen; Inject 80 Units into the skin daily.  Dispense: 22.5 mL; Refill: 5 - Semaglutide , 1 MG/DOSE, 4 MG/3ML SOPN; Inject 1 mg as directed once a week.  Dispense: 3 mL; Refill: 4  2. Long-term (current) use of injectable non-insulin  antidiabetic drugs See #1 above  3. Painful diabetic neuropathy (HCC) Looks like the 600 mg of gabapentin  comes only in tablet not capsule form.  We will increase the frequency to 600 mg 3 times a day since she sometimes has to take it 3 times a day.  She would like referral to [facility] since Heather will no longer see if; she did not want them to be her PCP as she has been with me for almost 8 yrs. - gabapentin  (NEURONTIN ) 600 MG tablet; Take 1 tablet (600 mg total) by mouth 3 (three) times daily.  Dispense: 270 tablet; Refill: 1 - Ambulatory referral to Pain Clinic  4. Hypertension associated  with diabetes (HCC) Not at goal.  Patient has not taken medicines as yet for the morning.  She will take them when she returns home.  Continue carvedilol  6.25 mg twice a day, hydralazine  100 mg twice a day and olmesartan /amlodipine /HCTZ combination pill once a day.  Try to check blood pressure at least once a week with goal being 130/80 or lower. - Olmesartan -amLODIPine -HCTZ 40-10-25 MG TABS; Take 1 tablet by mouth daily.  Dispense: 90 tablet; Refill: 1  5. Hyperlipidemia associated with type 2 diabetes mellitus (HCC) Continue atorvastatin  80 mg daily.  6. Need for influenza vaccination - Flu vaccine HIGH DOSE PF(Fluzone Trivalent)    Patient was given the opportunity to ask questions.  Patient verbalized understanding of the plan and was able to repeat key elements of the plan.   This documentation was completed  using Paediatric nurse.  Any transcriptional errors are unintentional.  Orders Placed This Encounter  Procedures   Flu vaccine HIGH DOSE PF(Fluzone Trivalent)   CBC   Comprehensive metabolic panel with GFR   Lipid panel   Microalbumin / creatinine urine ratio   Ambulatory referral to Pain Clinic   POCT glucose (manual entry)   POCT glycosylated hemoglobin (Hb A1C)     Requested Prescriptions   Signed Prescriptions Disp Refills   insulin  aspart (NOVOLOG ) 100 UNIT/ML FlexPen 45 mL 3    Sig: Inject 30 Units into the skin 3 (three) times daily with meals.   gabapentin  (NEURONTIN ) 600 MG tablet 270 tablet 1    Sig: Take 1 tablet (600 mg total) by mouth 3 (three) times daily.   TOUJEO  SOLOSTAR 300 UNIT/ML Solostar Pen 22.5 mL 5    Sig: Inject 80 Units into the skin daily.   Olmesartan -amLODIPine -HCTZ 40-10-25 MG TABS 90 tablet 1    Sig: Take 1 tablet by mouth daily.   Semaglutide , 1 MG/DOSE, 4 MG/3ML SOPN 3 mL 4    Sig: Inject 1 mg as directed once a week.    Return in about 4 months (around 04/08/2025) for BP check Luke in 4 wks.  Barnie Louder, MD, FACP     [1]  Current Outpatient Medications on File Prior to Visit  Medication Sig Dispense Refill   Accu-Chek Softclix Lancets lancets Use to check blood sugar 3 times daily. 100 each 6   atorvastatin  (LIPITOR ) 80 MG tablet Take 1 tablet (80 mg total) by mouth daily. 90 tablet 1   BAQSIMI ONE PACK 3 MG/DOSE POWD Place 1 spray into the nose as needed (Low blood sugar).     Blood Glucose Monitoring Suppl (ACCU-CHEK GUIDE) w/Device KIT Use to check blood sugar 3 times daily. 1 kit 0   Blood Pressure KIT Check blood pressure twice a day Dx: I10, hypertension 1 kit 0   carvedilol  (COREG ) 6.25 MG tablet Take 1 tablet (6.25 mg total) by mouth 2 (two) times daily with a meal. 60 tablet 5   clopidogrel  (PLAVIX ) 75 MG tablet Take 1 tablet (75 mg total) by mouth daily. 90 tablet 3   Continuous Glucose Sensor (FREESTYLE  LIBRE 3 SENSOR) MISC APPLY NEW SENSOR EVERY 14 DAYS 6 each 3   glucose blood (ACCU-CHEK GUIDE TEST) test strip Use to check blood sugar 3 times daily. 100 each 6   hydrALAZINE  (APRESOLINE ) 100 MG tablet Take 1 tablet (100 mg total) by mouth 2 (two) times daily. 180 tablet 1   HYDROcodone -acetaminophen  (NORCO) 10-325 MG tablet Take 1 tablet by mouth 5 (five) times daily as needed.  loratadine  (CLARITIN ) 10 MG tablet TAKE 1 TABLET BY MOUTH EVERY DAY 90 tablet 1   Multiple Vitamins-Minerals (MULTIVITAMIN ADULT) CHEW Chew 2 tablets by mouth daily.     mupirocin  ointment (BACTROBAN ) 2 % Apply 1 Application topically 2 (two) times daily. 30 g 2   RESTASIS 0.05 % ophthalmic emulsion Place 1 drop into both eyes 2 (two) times daily.     TRUEPLUS PEN NEEDLES 31G X 5 MM MISC Use to inject Toujeo  once daily. 100 each 2   VITAMIN D  PO Take 2 tablets by mouth at bedtime.     fluticasone  (FLONASE ) 50 MCG/ACT nasal spray 1 SPRAY EACH NOSTRIL DAILY X 1 WEEK THEN AS NEEDED (Patient not taking: Reported on 12/09/2024) 48 mL 1   No current facility-administered medications on file prior to visit.  [2]  Allergies Allergen Reactions   Other Swelling    Seaweed= swelling on arms, hands and face   Metformin  And Related Nausea Only   "

## 2024-12-10 ENCOUNTER — Ambulatory Visit: Payer: Self-pay | Admitting: Internal Medicine

## 2024-12-10 NOTE — Progress Notes (Signed)
-  Blood cell counts are normal. -Kidney function not 100% and decreased compared to 1 yr ago. Good DM and BP control are important to preserve kidney function. Avoid extended use of OTC pain meds like Motrin, Advil, Aleve, Naprosyn, Ibuprofen as these can make kidney function worse. Liver function tests good except for mild elevation in one but this has improved compared to one yr ago. LDL cholesterol, the bad cholesterol, is 84 with goal being less than 70. Continue Atorvastatin  80 mg daily. Avoid fatty, fried foods. Cook with healthier oils like Olive Oil.

## 2024-12-11 LAB — CBC
Hematocrit: 44.7 % (ref 34.0–46.6)
Hemoglobin: 13.8 g/dL (ref 11.1–15.9)
MCH: 27.8 pg (ref 26.6–33.0)
MCHC: 30.9 g/dL — ABNORMAL LOW (ref 31.5–35.7)
MCV: 90 fL (ref 79–97)
Platelets: 217 x10E3/uL (ref 150–450)
RBC: 4.96 x10E6/uL (ref 3.77–5.28)
RDW: 12.5 % (ref 11.7–15.4)
WBC: 5.4 x10E3/uL (ref 3.4–10.8)

## 2024-12-11 LAB — COMPREHENSIVE METABOLIC PANEL WITH GFR
ALT: 11 IU/L (ref 0–32)
AST: 12 IU/L (ref 0–40)
Albumin: 4 g/dL (ref 3.9–4.9)
Alkaline Phosphatase: 144 IU/L — ABNORMAL HIGH (ref 49–135)
BUN/Creatinine Ratio: 18 (ref 12–28)
BUN: 18 mg/dL (ref 8–27)
Bilirubin Total: 0.5 mg/dL (ref 0.0–1.2)
CO2: 26 mmol/L (ref 20–29)
Calcium: 9.4 mg/dL (ref 8.7–10.3)
Chloride: 99 mmol/L (ref 96–106)
Creatinine, Ser: 0.98 mg/dL (ref 0.57–1.00)
Globulin, Total: 2.8 g/dL (ref 1.5–4.5)
Glucose: 185 mg/dL — ABNORMAL HIGH (ref 70–99)
Potassium: 4.5 mmol/L (ref 3.5–5.2)
Sodium: 139 mmol/L (ref 134–144)
Total Protein: 6.8 g/dL (ref 6.0–8.5)
eGFR: 63 mL/min/1.73

## 2024-12-11 LAB — LIPID PANEL
Chol/HDL Ratio: 2.8 ratio (ref 0.0–4.4)
Cholesterol, Total: 157 mg/dL (ref 100–199)
HDL: 57 mg/dL
LDL Chol Calc (NIH): 84 mg/dL (ref 0–99)
Triglycerides: 87 mg/dL (ref 0–149)
VLDL Cholesterol Cal: 16 mg/dL (ref 5–40)

## 2024-12-11 LAB — MICROALBUMIN / CREATININE URINE RATIO
Creatinine, Urine: 63.6 mg/dL
Microalb/Creat Ratio: 72 mg/g{creat} — AB (ref 0–29)
Microalbumin, Urine: 45.6 ug/mL

## 2024-12-24 ENCOUNTER — Other Ambulatory Visit: Payer: Self-pay

## 2024-12-24 DIAGNOSIS — I739 Peripheral vascular disease, unspecified: Secondary | ICD-10-CM

## 2024-12-31 ENCOUNTER — Ambulatory Visit: Payer: Self-pay

## 2025-01-13 ENCOUNTER — Ambulatory Visit: Payer: Self-pay | Admitting: Pharmacist

## 2025-01-20 ENCOUNTER — Ambulatory Visit (HOSPITAL_COMMUNITY)

## 2025-01-20 ENCOUNTER — Ambulatory Visit

## 2025-02-19 ENCOUNTER — Ambulatory Visit: Admitting: Podiatry

## 2025-04-09 ENCOUNTER — Ambulatory Visit: Payer: Self-pay | Admitting: Internal Medicine

## 2025-04-09 ENCOUNTER — Ambulatory Visit: Admitting: Internal Medicine

## 2025-05-05 ENCOUNTER — Ambulatory Visit
